# Patient Record
Sex: Female | Born: 1940 | Race: White | Hispanic: No | Smoking: Former smoker
Health system: Southern US, Community
[De-identification: ages and names within clinical notes are randomized; demographics above are authoritative.]

## PROBLEM LIST (undated history)

## (undated) DIAGNOSIS — R569 Unspecified convulsions: Secondary | ICD-10-CM

## (undated) DIAGNOSIS — N189 Chronic kidney disease, unspecified: Secondary | ICD-10-CM

## (undated) DIAGNOSIS — I10 Essential (primary) hypertension: Secondary | ICD-10-CM

## (undated) DIAGNOSIS — E119 Type 2 diabetes mellitus without complications: Secondary | ICD-10-CM

## (undated) DIAGNOSIS — E785 Hyperlipidemia, unspecified: Secondary | ICD-10-CM

## (undated) DIAGNOSIS — J189 Pneumonia, unspecified organism: Secondary | ICD-10-CM

## (undated) DIAGNOSIS — S42309A Unspecified fracture of shaft of humerus, unspecified arm, initial encounter for closed fracture: Secondary | ICD-10-CM

## (undated) DIAGNOSIS — R0600 Dyspnea, unspecified: Secondary | ICD-10-CM

## (undated) DIAGNOSIS — M75100 Unspecified rotator cuff tear or rupture of unspecified shoulder, not specified as traumatic: Secondary | ICD-10-CM

## (undated) DIAGNOSIS — J449 Chronic obstructive pulmonary disease, unspecified: Secondary | ICD-10-CM

## (undated) DIAGNOSIS — Z87442 Personal history of urinary calculi: Secondary | ICD-10-CM

## (undated) DIAGNOSIS — C801 Malignant (primary) neoplasm, unspecified: Secondary | ICD-10-CM

## (undated) DIAGNOSIS — T7840XA Allergy, unspecified, initial encounter: Secondary | ICD-10-CM

## (undated) DIAGNOSIS — Z923 Personal history of irradiation: Secondary | ICD-10-CM

## (undated) DIAGNOSIS — K635 Polyp of colon: Secondary | ICD-10-CM

## (undated) DIAGNOSIS — M7541 Impingement syndrome of right shoulder: Secondary | ICD-10-CM

## (undated) DIAGNOSIS — M1990 Unspecified osteoarthritis, unspecified site: Secondary | ICD-10-CM

## (undated) DIAGNOSIS — G4730 Sleep apnea, unspecified: Secondary | ICD-10-CM

## (undated) HISTORY — PX: EXPLORATORY LAPAROTOMY: SUR591

## (undated) HISTORY — PX: NASAL SEPTUM SURGERY: SHX37

## (undated) HISTORY — PX: OOPHORECTOMY: SHX86

## (undated) HISTORY — PX: ABDOMINAL HYSTERECTOMY: SHX81

## (undated) HISTORY — PX: EYE SURGERY: SHX253

## (undated) HISTORY — DX: Allergy, unspecified, initial encounter: T78.40XA

## (undated) HISTORY — PX: JOINT REPLACEMENT: SHX530

---

## 2002-11-14 HISTORY — PX: REPLACEMENT TOTAL KNEE: SUR1224

## 2008-04-24 ENCOUNTER — Ambulatory Visit: Payer: Self-pay | Admitting: Gastroenterology

## 2008-08-28 ENCOUNTER — Ambulatory Visit: Payer: Self-pay | Admitting: Sports Medicine

## 2010-07-27 ENCOUNTER — Ambulatory Visit: Payer: Self-pay | Admitting: Family Medicine

## 2011-03-02 ENCOUNTER — Ambulatory Visit: Payer: Self-pay | Admitting: Specialist

## 2011-06-22 ENCOUNTER — Ambulatory Visit: Payer: Self-pay | Admitting: Emergency Medicine

## 2011-06-27 LAB — PATHOLOGY REPORT

## 2011-08-08 ENCOUNTER — Ambulatory Visit: Payer: Self-pay | Admitting: Nephrology

## 2011-10-04 ENCOUNTER — Ambulatory Visit: Payer: Self-pay | Admitting: Emergency Medicine

## 2011-11-18 DIAGNOSIS — J342 Deviated nasal septum: Secondary | ICD-10-CM | POA: Diagnosis not present

## 2011-11-18 DIAGNOSIS — J328 Other chronic sinusitis: Secondary | ICD-10-CM | POA: Diagnosis not present

## 2011-11-18 DIAGNOSIS — J343 Hypertrophy of nasal turbinates: Secondary | ICD-10-CM | POA: Diagnosis not present

## 2011-11-18 DIAGNOSIS — J301 Allergic rhinitis due to pollen: Secondary | ICD-10-CM | POA: Diagnosis not present

## 2011-11-22 ENCOUNTER — Ambulatory Visit: Payer: Self-pay | Admitting: Otolaryngology

## 2011-11-22 DIAGNOSIS — J342 Deviated nasal septum: Secondary | ICD-10-CM | POA: Diagnosis not present

## 2011-11-22 DIAGNOSIS — Z79899 Other long term (current) drug therapy: Secondary | ICD-10-CM | POA: Diagnosis not present

## 2011-11-22 DIAGNOSIS — Z01812 Encounter for preprocedural laboratory examination: Secondary | ICD-10-CM | POA: Diagnosis not present

## 2011-11-22 DIAGNOSIS — Z01810 Encounter for preprocedural cardiovascular examination: Secondary | ICD-10-CM | POA: Diagnosis not present

## 2011-11-22 LAB — BASIC METABOLIC PANEL
Anion Gap: 8 (ref 7–16)
BUN: 28 mg/dL — ABNORMAL HIGH (ref 7–18)
Calcium, Total: 9.8 mg/dL (ref 8.5–10.1)
Creatinine: 0.85 mg/dL (ref 0.60–1.30)
EGFR (African American): >60
Glucose: 115 mg/dL — ABNORMAL HIGH (ref 65–99)
Sodium: 142 mmol/L (ref 136–145)

## 2011-11-22 LAB — HEMOGLOBIN: HGB: 13.7 g/dL (ref 12.0–16.0)

## 2011-11-24 ENCOUNTER — Ambulatory Visit: Payer: Self-pay | Admitting: Otolaryngology

## 2011-11-24 DIAGNOSIS — J343 Hypertrophy of nasal turbinates: Secondary | ICD-10-CM | POA: Diagnosis not present

## 2011-11-24 DIAGNOSIS — J342 Deviated nasal septum: Secondary | ICD-10-CM | POA: Diagnosis not present

## 2011-11-24 DIAGNOSIS — J449 Chronic obstructive pulmonary disease, unspecified: Secondary | ICD-10-CM | POA: Diagnosis not present

## 2011-11-24 DIAGNOSIS — J3489 Other specified disorders of nose and nasal sinuses: Secondary | ICD-10-CM | POA: Diagnosis not present

## 2011-11-24 DIAGNOSIS — Z8544 Personal history of malignant neoplasm of other female genital organs: Secondary | ICD-10-CM | POA: Diagnosis not present

## 2011-11-24 DIAGNOSIS — Z79899 Other long term (current) drug therapy: Secondary | ICD-10-CM | POA: Diagnosis not present

## 2011-11-24 DIAGNOSIS — E119 Type 2 diabetes mellitus without complications: Secondary | ICD-10-CM | POA: Diagnosis not present

## 2011-11-24 DIAGNOSIS — D649 Anemia, unspecified: Secondary | ICD-10-CM | POA: Diagnosis not present

## 2011-11-24 DIAGNOSIS — I10 Essential (primary) hypertension: Secondary | ICD-10-CM | POA: Diagnosis not present

## 2011-11-24 DIAGNOSIS — G4730 Sleep apnea, unspecified: Secondary | ICD-10-CM | POA: Diagnosis not present

## 2011-11-30 DIAGNOSIS — J3489 Other specified disorders of nose and nasal sinuses: Secondary | ICD-10-CM | POA: Diagnosis not present

## 2011-11-30 DIAGNOSIS — J342 Deviated nasal septum: Secondary | ICD-10-CM | POA: Diagnosis not present

## 2011-11-30 DIAGNOSIS — J343 Hypertrophy of nasal turbinates: Secondary | ICD-10-CM | POA: Diagnosis not present

## 2011-12-19 DIAGNOSIS — R040 Epistaxis: Secondary | ICD-10-CM | POA: Diagnosis not present

## 2011-12-19 DIAGNOSIS — J328 Other chronic sinusitis: Secondary | ICD-10-CM | POA: Diagnosis not present

## 2011-12-29 DIAGNOSIS — I10 Essential (primary) hypertension: Secondary | ICD-10-CM | POA: Diagnosis not present

## 2012-01-16 DIAGNOSIS — J328 Other chronic sinusitis: Secondary | ICD-10-CM | POA: Diagnosis not present

## 2012-01-27 DIAGNOSIS — R809 Proteinuria, unspecified: Secondary | ICD-10-CM | POA: Diagnosis not present

## 2012-01-27 DIAGNOSIS — I10 Essential (primary) hypertension: Secondary | ICD-10-CM | POA: Diagnosis not present

## 2012-03-13 DIAGNOSIS — I10 Essential (primary) hypertension: Secondary | ICD-10-CM | POA: Diagnosis not present

## 2012-03-13 DIAGNOSIS — E7800 Pure hypercholesterolemia, unspecified: Secondary | ICD-10-CM | POA: Diagnosis not present

## 2012-03-13 DIAGNOSIS — E119 Type 2 diabetes mellitus without complications: Secondary | ICD-10-CM | POA: Diagnosis not present

## 2012-03-13 DIAGNOSIS — E785 Hyperlipidemia, unspecified: Secondary | ICD-10-CM | POA: Diagnosis not present

## 2012-04-27 DIAGNOSIS — J328 Other chronic sinusitis: Secondary | ICD-10-CM | POA: Diagnosis not present

## 2012-04-27 DIAGNOSIS — H9319 Tinnitus, unspecified ear: Secondary | ICD-10-CM | POA: Diagnosis not present

## 2012-05-15 DIAGNOSIS — I10 Essential (primary) hypertension: Secondary | ICD-10-CM | POA: Diagnosis not present

## 2012-05-15 DIAGNOSIS — R809 Proteinuria, unspecified: Secondary | ICD-10-CM | POA: Diagnosis not present

## 2012-06-12 DIAGNOSIS — I10 Essential (primary) hypertension: Secondary | ICD-10-CM | POA: Diagnosis not present

## 2012-06-12 DIAGNOSIS — E119 Type 2 diabetes mellitus without complications: Secondary | ICD-10-CM | POA: Diagnosis not present

## 2012-06-12 DIAGNOSIS — E785 Hyperlipidemia, unspecified: Secondary | ICD-10-CM | POA: Diagnosis not present

## 2012-07-17 DIAGNOSIS — J019 Acute sinusitis, unspecified: Secondary | ICD-10-CM | POA: Diagnosis not present

## 2012-08-01 DIAGNOSIS — M159 Polyosteoarthritis, unspecified: Secondary | ICD-10-CM | POA: Diagnosis not present

## 2012-08-01 DIAGNOSIS — Z23 Encounter for immunization: Secondary | ICD-10-CM | POA: Diagnosis not present

## 2012-08-01 DIAGNOSIS — I10 Essential (primary) hypertension: Secondary | ICD-10-CM | POA: Diagnosis not present

## 2012-08-08 ENCOUNTER — Ambulatory Visit: Payer: Self-pay

## 2012-08-08 DIAGNOSIS — Z1231 Encounter for screening mammogram for malignant neoplasm of breast: Secondary | ICD-10-CM | POA: Diagnosis not present

## 2012-08-08 DIAGNOSIS — N63 Unspecified lump in unspecified breast: Secondary | ICD-10-CM | POA: Diagnosis not present

## 2012-08-09 ENCOUNTER — Ambulatory Visit: Payer: Self-pay

## 2012-08-09 DIAGNOSIS — N63 Unspecified lump in unspecified breast: Secondary | ICD-10-CM | POA: Diagnosis not present

## 2012-08-09 DIAGNOSIS — R928 Other abnormal and inconclusive findings on diagnostic imaging of breast: Secondary | ICD-10-CM | POA: Diagnosis not present

## 2012-09-05 DIAGNOSIS — H3530 Unspecified macular degeneration: Secondary | ICD-10-CM | POA: Diagnosis not present

## 2012-09-16 ENCOUNTER — Ambulatory Visit: Payer: Self-pay | Admitting: Internal Medicine

## 2012-09-16 ENCOUNTER — Inpatient Hospital Stay: Payer: Self-pay | Admitting: Internal Medicine

## 2012-09-16 DIAGNOSIS — Z79899 Other long term (current) drug therapy: Secondary | ICD-10-CM | POA: Diagnosis not present

## 2012-09-16 DIAGNOSIS — Z418 Encounter for other procedures for purposes other than remedying health state: Secondary | ICD-10-CM | POA: Diagnosis not present

## 2012-09-16 DIAGNOSIS — A419 Sepsis, unspecified organism: Secondary | ICD-10-CM | POA: Diagnosis present

## 2012-09-16 DIAGNOSIS — R509 Fever, unspecified: Secondary | ICD-10-CM | POA: Diagnosis not present

## 2012-09-16 DIAGNOSIS — J15211 Pneumonia due to Methicillin susceptible Staphylococcus aureus: Secondary | ICD-10-CM | POA: Diagnosis present

## 2012-09-16 DIAGNOSIS — J449 Chronic obstructive pulmonary disease, unspecified: Secondary | ICD-10-CM | POA: Diagnosis not present

## 2012-09-16 DIAGNOSIS — R0902 Hypoxemia: Secondary | ICD-10-CM | POA: Diagnosis not present

## 2012-09-16 DIAGNOSIS — M5030 Other cervical disc degeneration, unspecified cervical region: Secondary | ICD-10-CM | POA: Diagnosis present

## 2012-09-16 DIAGNOSIS — G40909 Epilepsy, unspecified, not intractable, without status epilepticus: Secondary | ICD-10-CM | POA: Diagnosis present

## 2012-09-16 DIAGNOSIS — Z9889 Other specified postprocedural states: Secondary | ICD-10-CM | POA: Diagnosis not present

## 2012-09-16 DIAGNOSIS — I10 Essential (primary) hypertension: Secondary | ICD-10-CM | POA: Diagnosis not present

## 2012-09-16 DIAGNOSIS — J441 Chronic obstructive pulmonary disease with (acute) exacerbation: Secondary | ICD-10-CM | POA: Diagnosis not present

## 2012-09-16 DIAGNOSIS — R042 Hemoptysis: Secondary | ICD-10-CM | POA: Diagnosis not present

## 2012-09-16 DIAGNOSIS — J159 Unspecified bacterial pneumonia: Secondary | ICD-10-CM | POA: Diagnosis not present

## 2012-09-16 DIAGNOSIS — R079 Chest pain, unspecified: Secondary | ICD-10-CM | POA: Diagnosis not present

## 2012-09-16 DIAGNOSIS — R6889 Other general symptoms and signs: Secondary | ICD-10-CM | POA: Diagnosis not present

## 2012-09-16 DIAGNOSIS — R7989 Other specified abnormal findings of blood chemistry: Secondary | ICD-10-CM | POA: Diagnosis not present

## 2012-09-16 DIAGNOSIS — Z8544 Personal history of malignant neoplasm of other female genital organs: Secondary | ICD-10-CM | POA: Diagnosis not present

## 2012-09-16 DIAGNOSIS — Z6836 Body mass index (BMI) 36.0-36.9, adult: Secondary | ICD-10-CM | POA: Diagnosis not present

## 2012-09-16 DIAGNOSIS — G8929 Other chronic pain: Secondary | ICD-10-CM | POA: Diagnosis present

## 2012-09-16 DIAGNOSIS — I509 Heart failure, unspecified: Secondary | ICD-10-CM | POA: Diagnosis not present

## 2012-09-16 DIAGNOSIS — N179 Acute kidney failure, unspecified: Secondary | ICD-10-CM | POA: Diagnosis not present

## 2012-09-16 DIAGNOSIS — J189 Pneumonia, unspecified organism: Secondary | ICD-10-CM | POA: Diagnosis not present

## 2012-09-16 DIAGNOSIS — E785 Hyperlipidemia, unspecified: Secondary | ICD-10-CM | POA: Diagnosis not present

## 2012-09-16 DIAGNOSIS — Z452 Encounter for adjustment and management of vascular access device: Secondary | ICD-10-CM | POA: Diagnosis not present

## 2012-09-16 DIAGNOSIS — R0602 Shortness of breath: Secondary | ICD-10-CM | POA: Diagnosis not present

## 2012-09-16 DIAGNOSIS — E119 Type 2 diabetes mellitus without complications: Secondary | ICD-10-CM | POA: Diagnosis present

## 2012-09-16 DIAGNOSIS — A4901 Methicillin susceptible Staphylococcus aureus infection, unspecified site: Secondary | ICD-10-CM | POA: Diagnosis not present

## 2012-09-16 DIAGNOSIS — Z87891 Personal history of nicotine dependence: Secondary | ICD-10-CM | POA: Diagnosis not present

## 2012-09-16 DIAGNOSIS — R578 Other shock: Secondary | ICD-10-CM | POA: Diagnosis not present

## 2012-09-16 DIAGNOSIS — J9600 Acute respiratory failure, unspecified whether with hypoxia or hypercapnia: Secondary | ICD-10-CM | POA: Diagnosis present

## 2012-09-16 DIAGNOSIS — E669 Obesity, unspecified: Secondary | ICD-10-CM | POA: Diagnosis present

## 2012-09-16 DIAGNOSIS — R05 Cough: Secondary | ICD-10-CM | POA: Diagnosis not present

## 2012-09-16 DIAGNOSIS — G4730 Sleep apnea, unspecified: Secondary | ICD-10-CM | POA: Diagnosis present

## 2012-09-16 DIAGNOSIS — R918 Other nonspecific abnormal finding of lung field: Secondary | ICD-10-CM | POA: Diagnosis not present

## 2012-09-16 DIAGNOSIS — A4101 Sepsis due to Methicillin susceptible Staphylococcus aureus: Secondary | ICD-10-CM | POA: Diagnosis present

## 2012-09-16 DIAGNOSIS — Z96659 Presence of unspecified artificial knee joint: Secondary | ICD-10-CM | POA: Diagnosis not present

## 2012-09-16 LAB — CBC WITH DIFFERENTIAL/PLATELET
Basophil %: 0.1 %
Eosinophil #: 0.1 10*3/uL (ref 0.0–0.7)
Eosinophil %: 0.3 %
HGB: 13 g/dL (ref 12.0–16.0)
Lymphocyte #: 0.6 10*3/uL — ABNORMAL LOW (ref 1.0–3.6)
MCH: 33.5 pg (ref 26.0–34.0)
MCHC: 34.9 g/dL (ref 32.0–36.0)
MCV: 96 fL (ref 80–100)
Monocyte #: 2 x10 3/mm — ABNORMAL HIGH (ref 0.2–0.9)
Neutrophil %: 89.3 %
Platelet: 357 10*3/uL (ref 150–440)
RBC: 3.88 10*6/uL (ref 3.80–5.20)
WBC: 25.6 10*3/uL — ABNORMAL HIGH (ref 3.6–11.0)

## 2012-09-16 LAB — COMPREHENSIVE METABOLIC PANEL
Albumin: 3.1 g/dL — ABNORMAL LOW (ref 3.4–5.0)
Anion Gap: 14 (ref 7–16)
BUN: 33 mg/dL — ABNORMAL HIGH (ref 7–18)
Chloride: 95 mmol/L — ABNORMAL LOW (ref 98–107)
EGFR (African American): 39 — ABNORMAL LOW
EGFR (Non-African Amer.): 34 — ABNORMAL LOW
Glucose: 134 mg/dL — ABNORMAL HIGH (ref 65–99)
Osmolality: 272 (ref 275–301)
Potassium: 3.3 mmol/L — ABNORMAL LOW (ref 3.5–5.1)
SGOT(AST): 52 U/L — ABNORMAL HIGH (ref 15–37)
Sodium: 131 mmol/L — ABNORMAL LOW (ref 136–145)
Total Protein: 6.8 g/dL (ref 6.4–8.2)

## 2012-09-16 LAB — TROPONIN I: Troponin-I: <0.02 ng/mL

## 2012-09-17 LAB — HEPATIC FUNCTION PANEL A (ARMC)
Alkaline Phosphatase: 115 U/L (ref 50–136)
Bilirubin, Direct: 1.2 mg/dL — ABNORMAL HIGH (ref 0.00–0.20)
SGOT(AST): 22 U/L (ref 15–37)
SGPT (ALT): 97 U/L — ABNORMAL HIGH (ref 12–78)

## 2012-09-17 LAB — BASIC METABOLIC PANEL
Anion Gap: 12 (ref 7–16)
BUN: 45 mg/dL — ABNORMAL HIGH (ref 7–18)
Co2: 23 mmol/L (ref 21–32)
Creatinine: 1.46 mg/dL — ABNORMAL HIGH (ref 0.60–1.30)
EGFR (African American): 42 — ABNORMAL LOW
EGFR (Non-African Amer.): 36 — ABNORMAL LOW
Glucose: 169 mg/dL — ABNORMAL HIGH (ref 65–99)
Potassium: 3.5 mmol/L (ref 3.5–5.1)

## 2012-09-17 LAB — CK TOTAL AND CKMB (NOT AT ARMC): CK, Total: 22 U/L (ref 21–215)

## 2012-09-17 LAB — CBC WITH DIFFERENTIAL/PLATELET
Basophil %: 0.2 %
Eosinophil #: 0.1 10*3/uL (ref 0.0–0.7)
HCT: 33.8 % — ABNORMAL LOW (ref 35.0–47.0)
Lymphocyte #: 0.4 10*3/uL — ABNORMAL LOW (ref 1.0–3.6)
Lymphocyte %: 1.7 %
MCH: 32.6 pg (ref 26.0–34.0)
MCHC: 34.5 g/dL (ref 32.0–36.0)
MCV: 95 fL (ref 80–100)
Monocyte #: 1 x10 3/mm — ABNORMAL HIGH (ref 0.2–0.9)
Neutrophil #: 22.2 10*3/uL — ABNORMAL HIGH (ref 1.4–6.5)
RBC: 3.58 10*6/uL — ABNORMAL LOW (ref 3.80–5.20)
RDW: 12.8 % (ref 11.5–14.5)
WBC: 23.7 10*3/uL — ABNORMAL HIGH (ref 3.6–11.0)

## 2012-09-17 LAB — PHOSPHORUS: Phosphorus: 2.2 mg/dL — ABNORMAL LOW (ref 2.5–4.9)

## 2012-09-18 LAB — CBC WITH DIFFERENTIAL/PLATELET
Basophil %: 0.2 %
Eosinophil %: 0 %
HGB: 11.4 g/dL — ABNORMAL LOW (ref 12.0–16.0)
Lymphocyte #: 0.4 10*3/uL — ABNORMAL LOW (ref 1.0–3.6)
Lymphocyte %: 2 %
MCHC: 33.8 g/dL (ref 32.0–36.0)
MCV: 94 fL (ref 80–100)
Monocyte %: 5.4 %
Neutrophil #: 19.9 10*3/uL — ABNORMAL HIGH (ref 1.4–6.5)
Platelet: 417 10*3/uL (ref 150–440)
RBC: 3.58 10*6/uL — ABNORMAL LOW (ref 3.80–5.20)
WBC: 21.5 10*3/uL — ABNORMAL HIGH (ref 3.6–11.0)

## 2012-09-18 LAB — BASIC METABOLIC PANEL
BUN: 32 mg/dL — ABNORMAL HIGH (ref 7–18)
Chloride: 106 mmol/L (ref 98–107)
Co2: 26 mmol/L (ref 21–32)
EGFR (Non-African Amer.): >60
Glucose: 179 mg/dL — ABNORMAL HIGH (ref 65–99)
Osmolality: 289 (ref 275–301)
Potassium: 3.8 mmol/L (ref 3.5–5.1)
Sodium: 139 mmol/L (ref 136–145)

## 2012-09-18 LAB — MAGNESIUM: Magnesium: 2 mg/dL

## 2012-09-18 LAB — TSH: Thyroid Stimulating Horm: 0.22 u[IU]/mL — ABNORMAL LOW

## 2012-09-18 LAB — PHENYTOIN LEVEL, TOTAL: Dilantin: 7.9 ug/mL — ABNORMAL LOW (ref 10.0–20.0)

## 2012-09-19 LAB — CBC WITH DIFFERENTIAL/PLATELET
HGB: 12.5 g/dL (ref 12.0–16.0)
MCH: 31.9 pg (ref 26.0–34.0)
MCV: 95 fL (ref 80–100)
Myelocyte: 3 %
Platelet: 446 10*3/uL — ABNORMAL HIGH (ref 150–440)
RBC: 3.91 10*6/uL (ref 3.80–5.20)
RDW: 13.1 % (ref 11.5–14.5)
WBC: 23.5 10*3/uL — ABNORMAL HIGH (ref 3.6–11.0)

## 2012-09-19 LAB — CREATININE, SERUM: EGFR (African American): >60

## 2012-09-19 LAB — EXPECTORATED SPUTUM ASSESSMENT W REFEX TO RESP CULTURE

## 2012-09-21 LAB — CREATININE, SERUM: EGFR (African American): >60

## 2012-09-21 LAB — CBC WITH DIFFERENTIAL/PLATELET
Basophil #: 0 10*3/uL (ref 0.0–0.1)
Basophil %: 0.2 %
Eosinophil #: 0.1 10*3/uL (ref 0.0–0.7)
HGB: 12.9 g/dL (ref 12.0–16.0)
Lymphocyte #: 1.2 10*3/uL (ref 1.0–3.6)
Lymphocyte %: 7.9 %
MCHC: 34.4 g/dL (ref 32.0–36.0)
MCV: 94 fL (ref 80–100)
Monocyte #: 1 x10 3/mm — ABNORMAL HIGH (ref 0.2–0.9)
Neutrophil %: 84.4 %
Platelet: 403 10*3/uL (ref 150–440)
RBC: 4.01 10*6/uL (ref 3.80–5.20)
RDW: 13 % (ref 11.5–14.5)

## 2012-09-22 LAB — CULTURE, BLOOD (SINGLE)

## 2012-09-23 LAB — BASIC METABOLIC PANEL
Anion Gap: 4 — ABNORMAL LOW (ref 7–16)
BUN: 14 mg/dL (ref 7–18)
Calcium, Total: 9.3 mg/dL (ref 8.5–10.1)
Chloride: 100 mmol/L (ref 98–107)
Co2: 35 mmol/L — ABNORMAL HIGH (ref 21–32)
Creatinine: 0.62 mg/dL (ref 0.60–1.30)
EGFR (African American): >60
Osmolality: 281 (ref 275–301)
Sodium: 139 mmol/L (ref 136–145)

## 2012-09-23 LAB — CBC WITH DIFFERENTIAL/PLATELET
Comment - H1-Com1: NORMAL
Lymphocytes: 17 %
MCH: 32.8 pg (ref 26.0–34.0)
MCHC: 34.5 g/dL (ref 32.0–36.0)
MCV: 95 fL (ref 80–100)
Monocytes: 2 %
Platelet: 360 10*3/uL (ref 150–440)
RBC: 4.15 10*6/uL (ref 3.80–5.20)
RDW: 13.1 % (ref 11.5–14.5)

## 2012-09-24 LAB — CBC WITH DIFFERENTIAL/PLATELET
Basophil #: 0.1 10*3/uL (ref 0.0–0.1)
Basophil %: 0.3 %
Eosinophil #: 0.5 10*3/uL (ref 0.0–0.7)
HGB: 13.5 g/dL (ref 12.0–16.0)
Lymphocyte %: 5.7 %
MCHC: 34.3 g/dL (ref 32.0–36.0)
Monocyte %: 3.3 %
Neutrophil #: 14.4 10*3/uL — ABNORMAL HIGH (ref 1.4–6.5)
Neutrophil %: 87.4 %
RBC: 4.16 10*6/uL (ref 3.80–5.20)

## 2012-09-24 LAB — VANCOMYCIN, TROUGH: Vancomycin, Trough: 13 ug/mL (ref 10–20)

## 2012-09-25 LAB — CBC WITH DIFFERENTIAL/PLATELET
Bands: 3 %
Basophil: 1 %
Comment - H1-Com2: NORMAL
Eosinophil: 2 %
HGB: 12.2 g/dL (ref 12.0–16.0)
Lymphocytes: 13 %
MCV: 95 fL (ref 80–100)
Monocytes: 4 %
Platelet: 315 10*3/uL (ref 150–440)
Segmented Neutrophils: 73 %
WBC: 13.2 10*3/uL — ABNORMAL HIGH (ref 3.6–11.0)

## 2012-09-26 DIAGNOSIS — J189 Pneumonia, unspecified organism: Secondary | ICD-10-CM | POA: Diagnosis not present

## 2012-09-26 DIAGNOSIS — A4901 Methicillin susceptible Staphylococcus aureus infection, unspecified site: Secondary | ICD-10-CM | POA: Diagnosis not present

## 2012-09-27 LAB — EXPECTORATED SPUTUM ASSESSMENT W GRAM STAIN, RFLX TO RESP C

## 2012-10-02 DIAGNOSIS — J189 Pneumonia, unspecified organism: Secondary | ICD-10-CM | POA: Diagnosis not present

## 2012-10-02 DIAGNOSIS — J449 Chronic obstructive pulmonary disease, unspecified: Secondary | ICD-10-CM | POA: Diagnosis not present

## 2012-10-02 DIAGNOSIS — R042 Hemoptysis: Secondary | ICD-10-CM | POA: Diagnosis not present

## 2012-10-02 DIAGNOSIS — J1520 Pneumonia due to staphylococcus, unspecified: Secondary | ICD-10-CM | POA: Diagnosis not present

## 2012-10-02 DIAGNOSIS — R0602 Shortness of breath: Secondary | ICD-10-CM | POA: Diagnosis not present

## 2012-10-09 ENCOUNTER — Ambulatory Visit: Payer: Self-pay | Admitting: Internal Medicine

## 2012-10-09 DIAGNOSIS — J1281 Pneumonia due to SARS-associated coronavirus: Secondary | ICD-10-CM | POA: Diagnosis not present

## 2012-10-09 DIAGNOSIS — J90 Pleural effusion, not elsewhere classified: Secondary | ICD-10-CM | POA: Diagnosis not present

## 2012-10-09 DIAGNOSIS — R918 Other nonspecific abnormal finding of lung field: Secondary | ICD-10-CM | POA: Diagnosis not present

## 2012-10-16 DIAGNOSIS — H25049 Posterior subcapsular polar age-related cataract, unspecified eye: Secondary | ICD-10-CM | POA: Diagnosis not present

## 2012-10-18 DIAGNOSIS — J449 Chronic obstructive pulmonary disease, unspecified: Secondary | ICD-10-CM | POA: Diagnosis not present

## 2012-10-18 DIAGNOSIS — J309 Allergic rhinitis, unspecified: Secondary | ICD-10-CM | POA: Diagnosis not present

## 2012-10-18 DIAGNOSIS — J1520 Pneumonia due to staphylococcus, unspecified: Secondary | ICD-10-CM | POA: Diagnosis not present

## 2012-10-23 DIAGNOSIS — E119 Type 2 diabetes mellitus without complications: Secondary | ICD-10-CM | POA: Diagnosis not present

## 2012-10-23 DIAGNOSIS — Z0000 Encounter for general adult medical examination without abnormal findings: Secondary | ICD-10-CM | POA: Diagnosis not present

## 2012-10-23 DIAGNOSIS — R569 Unspecified convulsions: Secondary | ICD-10-CM | POA: Diagnosis not present

## 2012-10-23 DIAGNOSIS — I10 Essential (primary) hypertension: Secondary | ICD-10-CM | POA: Diagnosis not present

## 2012-10-23 DIAGNOSIS — R5381 Other malaise: Secondary | ICD-10-CM | POA: Diagnosis not present

## 2012-10-23 DIAGNOSIS — E785 Hyperlipidemia, unspecified: Secondary | ICD-10-CM | POA: Diagnosis not present

## 2012-10-29 ENCOUNTER — Ambulatory Visit: Payer: Self-pay

## 2012-10-29 DIAGNOSIS — Q619 Cystic kidney disease, unspecified: Secondary | ICD-10-CM | POA: Diagnosis not present

## 2012-10-29 DIAGNOSIS — R160 Hepatomegaly, not elsewhere classified: Secondary | ICD-10-CM | POA: Diagnosis not present

## 2012-10-30 DIAGNOSIS — I10 Essential (primary) hypertension: Secondary | ICD-10-CM | POA: Diagnosis not present

## 2012-10-30 DIAGNOSIS — R809 Proteinuria, unspecified: Secondary | ICD-10-CM | POA: Diagnosis not present

## 2012-10-31 DIAGNOSIS — R0602 Shortness of breath: Secondary | ICD-10-CM | POA: Diagnosis not present

## 2012-11-16 ENCOUNTER — Ambulatory Visit: Payer: Self-pay | Admitting: Internal Medicine

## 2012-11-16 DIAGNOSIS — J189 Pneumonia, unspecified organism: Secondary | ICD-10-CM | POA: Diagnosis not present

## 2012-11-16 DIAGNOSIS — J438 Other emphysema: Secondary | ICD-10-CM | POA: Diagnosis not present

## 2012-11-22 DIAGNOSIS — J449 Chronic obstructive pulmonary disease, unspecified: Secondary | ICD-10-CM | POA: Diagnosis not present

## 2012-11-22 DIAGNOSIS — J309 Allergic rhinitis, unspecified: Secondary | ICD-10-CM | POA: Diagnosis not present

## 2012-11-22 DIAGNOSIS — R042 Hemoptysis: Secondary | ICD-10-CM | POA: Diagnosis not present

## 2012-11-26 DIAGNOSIS — H25049 Posterior subcapsular polar age-related cataract, unspecified eye: Secondary | ICD-10-CM | POA: Diagnosis not present

## 2012-12-12 ENCOUNTER — Ambulatory Visit: Payer: Self-pay | Admitting: Ophthalmology

## 2012-12-12 DIAGNOSIS — Z79899 Other long term (current) drug therapy: Secondary | ICD-10-CM | POA: Diagnosis not present

## 2012-12-12 DIAGNOSIS — G4730 Sleep apnea, unspecified: Secondary | ICD-10-CM | POA: Diagnosis not present

## 2012-12-12 DIAGNOSIS — Z91041 Radiographic dye allergy status: Secondary | ICD-10-CM | POA: Diagnosis not present

## 2012-12-12 DIAGNOSIS — E119 Type 2 diabetes mellitus without complications: Secondary | ICD-10-CM | POA: Diagnosis not present

## 2012-12-12 DIAGNOSIS — Z888 Allergy status to other drugs, medicaments and biological substances status: Secondary | ICD-10-CM | POA: Diagnosis not present

## 2012-12-12 DIAGNOSIS — Z87891 Personal history of nicotine dependence: Secondary | ICD-10-CM | POA: Diagnosis not present

## 2012-12-12 DIAGNOSIS — I10 Essential (primary) hypertension: Secondary | ICD-10-CM | POA: Diagnosis not present

## 2012-12-12 DIAGNOSIS — K5730 Diverticulosis of large intestine without perforation or abscess without bleeding: Secondary | ICD-10-CM | POA: Diagnosis not present

## 2012-12-12 DIAGNOSIS — H25049 Posterior subcapsular polar age-related cataract, unspecified eye: Secondary | ICD-10-CM | POA: Diagnosis not present

## 2012-12-12 DIAGNOSIS — M1990 Unspecified osteoarthritis, unspecified site: Secondary | ICD-10-CM | POA: Diagnosis not present

## 2012-12-12 DIAGNOSIS — Z7982 Long term (current) use of aspirin: Secondary | ICD-10-CM | POA: Diagnosis not present

## 2012-12-12 DIAGNOSIS — E7800 Pure hypercholesterolemia, unspecified: Secondary | ICD-10-CM | POA: Diagnosis not present

## 2012-12-12 DIAGNOSIS — J449 Chronic obstructive pulmonary disease, unspecified: Secondary | ICD-10-CM | POA: Diagnosis not present

## 2013-03-08 DIAGNOSIS — J209 Acute bronchitis, unspecified: Secondary | ICD-10-CM | POA: Diagnosis not present

## 2013-03-08 DIAGNOSIS — J019 Acute sinusitis, unspecified: Secondary | ICD-10-CM | POA: Diagnosis not present

## 2013-03-08 DIAGNOSIS — J45901 Unspecified asthma with (acute) exacerbation: Secondary | ICD-10-CM | POA: Diagnosis not present

## 2013-04-29 DIAGNOSIS — I10 Essential (primary) hypertension: Secondary | ICD-10-CM | POA: Diagnosis not present

## 2013-04-29 DIAGNOSIS — J449 Chronic obstructive pulmonary disease, unspecified: Secondary | ICD-10-CM | POA: Diagnosis not present

## 2013-04-29 DIAGNOSIS — Z23 Encounter for immunization: Secondary | ICD-10-CM | POA: Diagnosis not present

## 2013-04-29 DIAGNOSIS — R0602 Shortness of breath: Secondary | ICD-10-CM | POA: Diagnosis not present

## 2013-04-29 DIAGNOSIS — E119 Type 2 diabetes mellitus without complications: Secondary | ICD-10-CM | POA: Diagnosis not present

## 2013-04-29 DIAGNOSIS — E785 Hyperlipidemia, unspecified: Secondary | ICD-10-CM | POA: Diagnosis not present

## 2013-04-30 DIAGNOSIS — I10 Essential (primary) hypertension: Secondary | ICD-10-CM | POA: Diagnosis not present

## 2013-04-30 DIAGNOSIS — R809 Proteinuria, unspecified: Secondary | ICD-10-CM | POA: Diagnosis not present

## 2013-05-01 DIAGNOSIS — J060 Acute laryngopharyngitis: Secondary | ICD-10-CM | POA: Diagnosis not present

## 2013-05-01 DIAGNOSIS — H6120 Impacted cerumen, unspecified ear: Secondary | ICD-10-CM | POA: Diagnosis not present

## 2013-05-01 DIAGNOSIS — J209 Acute bronchitis, unspecified: Secondary | ICD-10-CM | POA: Diagnosis not present

## 2013-05-06 DIAGNOSIS — I10 Essential (primary) hypertension: Secondary | ICD-10-CM | POA: Diagnosis not present

## 2013-05-06 DIAGNOSIS — R809 Proteinuria, unspecified: Secondary | ICD-10-CM | POA: Diagnosis not present

## 2013-05-22 DIAGNOSIS — R498 Other voice and resonance disorders: Secondary | ICD-10-CM | POA: Diagnosis not present

## 2013-05-22 DIAGNOSIS — K219 Gastro-esophageal reflux disease without esophagitis: Secondary | ICD-10-CM | POA: Diagnosis not present

## 2013-05-28 DIAGNOSIS — I10 Essential (primary) hypertension: Secondary | ICD-10-CM | POA: Diagnosis not present

## 2013-05-28 DIAGNOSIS — J45909 Unspecified asthma, uncomplicated: Secondary | ICD-10-CM | POA: Diagnosis not present

## 2013-06-18 DIAGNOSIS — H2510 Age-related nuclear cataract, unspecified eye: Secondary | ICD-10-CM | POA: Diagnosis not present

## 2013-06-28 DIAGNOSIS — H2510 Age-related nuclear cataract, unspecified eye: Secondary | ICD-10-CM | POA: Diagnosis not present

## 2013-07-10 ENCOUNTER — Ambulatory Visit: Payer: Self-pay | Admitting: Ophthalmology

## 2013-07-10 DIAGNOSIS — E119 Type 2 diabetes mellitus without complications: Secondary | ICD-10-CM | POA: Diagnosis not present

## 2013-07-10 DIAGNOSIS — M19019 Primary osteoarthritis, unspecified shoulder: Secondary | ICD-10-CM | POA: Diagnosis not present

## 2013-07-10 DIAGNOSIS — Z79899 Other long term (current) drug therapy: Secondary | ICD-10-CM | POA: Diagnosis not present

## 2013-07-10 DIAGNOSIS — H269 Unspecified cataract: Secondary | ICD-10-CM | POA: Diagnosis not present

## 2013-07-10 DIAGNOSIS — E669 Obesity, unspecified: Secondary | ICD-10-CM | POA: Diagnosis not present

## 2013-07-10 DIAGNOSIS — J449 Chronic obstructive pulmonary disease, unspecified: Secondary | ICD-10-CM | POA: Diagnosis not present

## 2013-07-10 DIAGNOSIS — G4733 Obstructive sleep apnea (adult) (pediatric): Secondary | ICD-10-CM | POA: Diagnosis not present

## 2013-07-10 DIAGNOSIS — R011 Cardiac murmur, unspecified: Secondary | ICD-10-CM | POA: Diagnosis not present

## 2013-07-10 DIAGNOSIS — Z7982 Long term (current) use of aspirin: Secondary | ICD-10-CM | POA: Diagnosis not present

## 2013-07-10 DIAGNOSIS — I10 Essential (primary) hypertension: Secondary | ICD-10-CM | POA: Diagnosis not present

## 2013-07-10 DIAGNOSIS — Z96659 Presence of unspecified artificial knee joint: Secondary | ICD-10-CM | POA: Diagnosis not present

## 2013-07-10 DIAGNOSIS — E7800 Pure hypercholesterolemia, unspecified: Secondary | ICD-10-CM | POA: Diagnosis not present

## 2013-07-10 DIAGNOSIS — H2510 Age-related nuclear cataract, unspecified eye: Secondary | ICD-10-CM | POA: Diagnosis not present

## 2013-07-10 DIAGNOSIS — IMO0002 Reserved for concepts with insufficient information to code with codable children: Secondary | ICD-10-CM | POA: Diagnosis not present

## 2013-07-10 DIAGNOSIS — Z87891 Personal history of nicotine dependence: Secondary | ICD-10-CM | POA: Diagnosis not present

## 2013-07-26 DIAGNOSIS — J45909 Unspecified asthma, uncomplicated: Secondary | ICD-10-CM | POA: Diagnosis not present

## 2013-08-27 ENCOUNTER — Ambulatory Visit: Payer: Self-pay

## 2013-08-27 DIAGNOSIS — Z1231 Encounter for screening mammogram for malignant neoplasm of breast: Secondary | ICD-10-CM | POA: Diagnosis not present

## 2013-08-28 DIAGNOSIS — E7800 Pure hypercholesterolemia, unspecified: Secondary | ICD-10-CM | POA: Diagnosis not present

## 2013-08-28 DIAGNOSIS — Z23 Encounter for immunization: Secondary | ICD-10-CM | POA: Diagnosis not present

## 2013-08-28 DIAGNOSIS — E119 Type 2 diabetes mellitus without complications: Secondary | ICD-10-CM | POA: Diagnosis not present

## 2013-08-28 DIAGNOSIS — I10 Essential (primary) hypertension: Secondary | ICD-10-CM | POA: Diagnosis not present

## 2013-10-16 DIAGNOSIS — J301 Allergic rhinitis due to pollen: Secondary | ICD-10-CM | POA: Diagnosis not present

## 2013-10-16 DIAGNOSIS — J45909 Unspecified asthma, uncomplicated: Secondary | ICD-10-CM | POA: Diagnosis not present

## 2013-10-16 DIAGNOSIS — Z0000 Encounter for general adult medical examination without abnormal findings: Secondary | ICD-10-CM | POA: Diagnosis not present

## 2013-10-16 DIAGNOSIS — R569 Unspecified convulsions: Secondary | ICD-10-CM | POA: Diagnosis not present

## 2013-10-16 DIAGNOSIS — E785 Hyperlipidemia, unspecified: Secondary | ICD-10-CM | POA: Diagnosis not present

## 2013-10-16 DIAGNOSIS — J45901 Unspecified asthma with (acute) exacerbation: Secondary | ICD-10-CM | POA: Diagnosis not present

## 2013-10-16 DIAGNOSIS — J449 Chronic obstructive pulmonary disease, unspecified: Secondary | ICD-10-CM | POA: Diagnosis not present

## 2013-10-16 DIAGNOSIS — E119 Type 2 diabetes mellitus without complications: Secondary | ICD-10-CM | POA: Diagnosis not present

## 2013-10-16 DIAGNOSIS — I10 Essential (primary) hypertension: Secondary | ICD-10-CM | POA: Diagnosis not present

## 2013-10-29 DIAGNOSIS — I10 Essential (primary) hypertension: Secondary | ICD-10-CM | POA: Diagnosis not present

## 2013-10-29 DIAGNOSIS — R809 Proteinuria, unspecified: Secondary | ICD-10-CM | POA: Diagnosis not present

## 2013-10-29 DIAGNOSIS — E669 Obesity, unspecified: Secondary | ICD-10-CM | POA: Diagnosis not present

## 2013-10-30 DIAGNOSIS — D485 Neoplasm of uncertain behavior of skin: Secondary | ICD-10-CM | POA: Diagnosis not present

## 2013-10-30 DIAGNOSIS — L940 Localized scleroderma [morphea]: Secondary | ICD-10-CM | POA: Diagnosis not present

## 2013-12-13 DIAGNOSIS — M19049 Primary osteoarthritis, unspecified hand: Secondary | ICD-10-CM | POA: Diagnosis not present

## 2013-12-13 DIAGNOSIS — M542 Cervicalgia: Secondary | ICD-10-CM | POA: Diagnosis not present

## 2014-01-03 DIAGNOSIS — M542 Cervicalgia: Secondary | ICD-10-CM | POA: Diagnosis not present

## 2014-01-03 DIAGNOSIS — M5030 Other cervical disc degeneration, unspecified cervical region: Secondary | ICD-10-CM | POA: Diagnosis not present

## 2014-02-04 DIAGNOSIS — H353190 Nonexudative age-related macular degeneration, unspecified eye, stage unspecified: Secondary | ICD-10-CM | POA: Diagnosis not present

## 2014-02-11 DIAGNOSIS — E039 Hypothyroidism, unspecified: Secondary | ICD-10-CM | POA: Diagnosis not present

## 2014-02-11 DIAGNOSIS — E041 Nontoxic single thyroid nodule: Secondary | ICD-10-CM | POA: Diagnosis not present

## 2014-02-11 DIAGNOSIS — G4733 Obstructive sleep apnea (adult) (pediatric): Secondary | ICD-10-CM | POA: Diagnosis not present

## 2014-02-20 ENCOUNTER — Emergency Department: Payer: Self-pay | Admitting: Emergency Medicine

## 2014-02-20 DIAGNOSIS — M7989 Other specified soft tissue disorders: Secondary | ICD-10-CM | POA: Diagnosis not present

## 2014-02-20 DIAGNOSIS — R0602 Shortness of breath: Secondary | ICD-10-CM | POA: Diagnosis not present

## 2014-02-20 DIAGNOSIS — E119 Type 2 diabetes mellitus without complications: Secondary | ICD-10-CM | POA: Diagnosis not present

## 2014-02-20 DIAGNOSIS — I10 Essential (primary) hypertension: Secondary | ICD-10-CM | POA: Diagnosis not present

## 2014-02-20 DIAGNOSIS — Z87891 Personal history of nicotine dependence: Secondary | ICD-10-CM | POA: Diagnosis not present

## 2014-02-20 DIAGNOSIS — J209 Acute bronchitis, unspecified: Secondary | ICD-10-CM | POA: Diagnosis not present

## 2014-02-20 DIAGNOSIS — Z9079 Acquired absence of other genital organ(s): Secondary | ICD-10-CM | POA: Diagnosis not present

## 2014-02-20 DIAGNOSIS — J40 Bronchitis, not specified as acute or chronic: Secondary | ICD-10-CM | POA: Diagnosis not present

## 2014-02-20 DIAGNOSIS — E785 Hyperlipidemia, unspecified: Secondary | ICD-10-CM | POA: Diagnosis not present

## 2014-02-21 DIAGNOSIS — J40 Bronchitis, not specified as acute or chronic: Secondary | ICD-10-CM | POA: Diagnosis not present

## 2014-02-21 DIAGNOSIS — M7989 Other specified soft tissue disorders: Secondary | ICD-10-CM | POA: Diagnosis not present

## 2014-02-21 LAB — BASIC METABOLIC PANEL
Anion Gap: 8 (ref 7–16)
BUN: 41 mg/dL — ABNORMAL HIGH (ref 7–18)
CHLORIDE: 106 mmol/L (ref 98–107)
Calcium, Total: 8.8 mg/dL (ref 8.5–10.1)
Co2: 25 mmol/L (ref 21–32)
Creatinine: 1.75 mg/dL — ABNORMAL HIGH (ref 0.60–1.30)
GFR CALC AF AMER: 34 — AB
GFR CALC NON AF AMER: 29 — AB
Glucose: 234 mg/dL — ABNORMAL HIGH (ref 65–99)
Osmolality: 295 (ref 275–301)
POTASSIUM: 4.2 mmol/L (ref 3.5–5.1)
Sodium: 139 mmol/L (ref 136–145)

## 2014-02-21 LAB — CBC
HCT: 41 % (ref 35.0–47.0)
HGB: 13.3 g/dL (ref 12.0–16.0)
MCH: 31 pg (ref 26.0–34.0)
MCHC: 32.4 g/dL (ref 32.0–36.0)
MCV: 96 fL (ref 80–100)
Platelet: 397 10*3/uL (ref 150–440)
RBC: 4.28 10*6/uL (ref 3.80–5.20)
RDW: 13.4 % (ref 11.5–14.5)
WBC: 10.6 10*3/uL (ref 3.6–11.0)

## 2014-02-21 LAB — D-DIMER(ARMC): D-Dimer: 463 ng/ml

## 2014-02-21 LAB — TROPONIN I

## 2014-03-05 DIAGNOSIS — R809 Proteinuria, unspecified: Secondary | ICD-10-CM | POA: Diagnosis not present

## 2014-03-05 DIAGNOSIS — I10 Essential (primary) hypertension: Secondary | ICD-10-CM | POA: Diagnosis not present

## 2014-04-18 ENCOUNTER — Ambulatory Visit: Payer: Self-pay

## 2014-04-18 DIAGNOSIS — R609 Edema, unspecified: Secondary | ICD-10-CM | POA: Diagnosis not present

## 2014-04-18 DIAGNOSIS — I10 Essential (primary) hypertension: Secondary | ICD-10-CM | POA: Diagnosis not present

## 2014-04-18 DIAGNOSIS — I2510 Atherosclerotic heart disease of native coronary artery without angina pectoris: Secondary | ICD-10-CM | POA: Diagnosis not present

## 2014-04-18 DIAGNOSIS — R0602 Shortness of breath: Secondary | ICD-10-CM | POA: Diagnosis not present

## 2014-04-18 DIAGNOSIS — G4700 Insomnia, unspecified: Secondary | ICD-10-CM | POA: Diagnosis not present

## 2014-04-21 DIAGNOSIS — I10 Essential (primary) hypertension: Secondary | ICD-10-CM | POA: Diagnosis not present

## 2014-04-21 DIAGNOSIS — R609 Edema, unspecified: Secondary | ICD-10-CM | POA: Diagnosis not present

## 2014-04-21 DIAGNOSIS — R7989 Other specified abnormal findings of blood chemistry: Secondary | ICD-10-CM | POA: Diagnosis not present

## 2014-04-25 ENCOUNTER — Ambulatory Visit: Payer: Self-pay

## 2014-04-25 DIAGNOSIS — M899 Disorder of bone, unspecified: Secondary | ICD-10-CM | POA: Diagnosis not present

## 2014-04-25 DIAGNOSIS — M949 Disorder of cartilage, unspecified: Secondary | ICD-10-CM | POA: Diagnosis not present

## 2014-04-25 DIAGNOSIS — M159 Polyosteoarthritis, unspecified: Secondary | ICD-10-CM | POA: Diagnosis not present

## 2014-04-30 ENCOUNTER — Ambulatory Visit: Payer: Self-pay | Admitting: Cardiology

## 2014-04-30 DIAGNOSIS — M79609 Pain in unspecified limb: Secondary | ICD-10-CM | POA: Diagnosis not present

## 2014-04-30 DIAGNOSIS — R609 Edema, unspecified: Secondary | ICD-10-CM | POA: Diagnosis not present

## 2014-04-30 DIAGNOSIS — E785 Hyperlipidemia, unspecified: Secondary | ICD-10-CM | POA: Diagnosis not present

## 2014-04-30 DIAGNOSIS — M7989 Other specified soft tissue disorders: Secondary | ICD-10-CM | POA: Diagnosis not present

## 2014-04-30 DIAGNOSIS — I10 Essential (primary) hypertension: Secondary | ICD-10-CM | POA: Diagnosis not present

## 2014-04-30 DIAGNOSIS — E119 Type 2 diabetes mellitus without complications: Secondary | ICD-10-CM | POA: Diagnosis not present

## 2014-04-30 HISTORY — DX: Hyperlipidemia, unspecified: E78.5

## 2014-05-12 DIAGNOSIS — R0602 Shortness of breath: Secondary | ICD-10-CM | POA: Diagnosis not present

## 2014-05-12 DIAGNOSIS — R609 Edema, unspecified: Secondary | ICD-10-CM | POA: Diagnosis not present

## 2014-05-21 DIAGNOSIS — Z860100 Personal history of colon polyps, unspecified: Secondary | ICD-10-CM | POA: Insufficient documentation

## 2014-05-21 DIAGNOSIS — N189 Chronic kidney disease, unspecified: Secondary | ICD-10-CM | POA: Diagnosis not present

## 2014-05-21 DIAGNOSIS — K635 Polyp of colon: Secondary | ICD-10-CM

## 2014-05-21 HISTORY — DX: Polyp of colon: K63.5

## 2014-06-16 DIAGNOSIS — I10 Essential (primary) hypertension: Secondary | ICD-10-CM | POA: Diagnosis not present

## 2014-06-16 DIAGNOSIS — E119 Type 2 diabetes mellitus without complications: Secondary | ICD-10-CM | POA: Diagnosis not present

## 2014-06-16 DIAGNOSIS — E785 Hyperlipidemia, unspecified: Secondary | ICD-10-CM | POA: Diagnosis not present

## 2014-06-16 DIAGNOSIS — N182 Chronic kidney disease, stage 2 (mild): Secondary | ICD-10-CM | POA: Diagnosis not present

## 2014-06-24 DIAGNOSIS — I10 Essential (primary) hypertension: Secondary | ICD-10-CM | POA: Diagnosis not present

## 2014-06-24 DIAGNOSIS — R809 Proteinuria, unspecified: Secondary | ICD-10-CM | POA: Diagnosis not present

## 2014-07-03 DIAGNOSIS — G4710 Hypersomnia, unspecified: Secondary | ICD-10-CM | POA: Diagnosis not present

## 2014-07-03 DIAGNOSIS — R0602 Shortness of breath: Secondary | ICD-10-CM | POA: Diagnosis not present

## 2014-07-03 DIAGNOSIS — J449 Chronic obstructive pulmonary disease, unspecified: Secondary | ICD-10-CM | POA: Diagnosis not present

## 2014-07-03 DIAGNOSIS — G4730 Sleep apnea, unspecified: Secondary | ICD-10-CM | POA: Diagnosis not present

## 2014-07-09 DIAGNOSIS — R0602 Shortness of breath: Secondary | ICD-10-CM | POA: Diagnosis not present

## 2014-07-09 DIAGNOSIS — I10 Essential (primary) hypertension: Secondary | ICD-10-CM | POA: Diagnosis not present

## 2014-07-09 DIAGNOSIS — R809 Proteinuria, unspecified: Secondary | ICD-10-CM | POA: Diagnosis not present

## 2014-07-29 DIAGNOSIS — G4720 Circadian rhythm sleep disorder, unspecified type: Secondary | ICD-10-CM | POA: Diagnosis not present

## 2014-07-29 DIAGNOSIS — G4710 Hypersomnia, unspecified: Secondary | ICD-10-CM | POA: Diagnosis not present

## 2014-07-31 DIAGNOSIS — G40909 Epilepsy, unspecified, not intractable, without status epilepticus: Secondary | ICD-10-CM | POA: Diagnosis not present

## 2014-07-31 DIAGNOSIS — E1129 Type 2 diabetes mellitus with other diabetic kidney complication: Secondary | ICD-10-CM | POA: Diagnosis not present

## 2014-07-31 DIAGNOSIS — I10 Essential (primary) hypertension: Secondary | ICD-10-CM | POA: Diagnosis not present

## 2014-07-31 DIAGNOSIS — N189 Chronic kidney disease, unspecified: Secondary | ICD-10-CM | POA: Insufficient documentation

## 2014-07-31 DIAGNOSIS — N058 Unspecified nephritic syndrome with other morphologic changes: Secondary | ICD-10-CM | POA: Diagnosis not present

## 2014-07-31 DIAGNOSIS — E785 Hyperlipidemia, unspecified: Secondary | ICD-10-CM | POA: Diagnosis not present

## 2014-07-31 DIAGNOSIS — N1831 Chronic kidney disease, stage 3a: Secondary | ICD-10-CM | POA: Insufficient documentation

## 2014-08-04 DIAGNOSIS — J449 Chronic obstructive pulmonary disease, unspecified: Secondary | ICD-10-CM | POA: Diagnosis not present

## 2014-08-04 DIAGNOSIS — R0602 Shortness of breath: Secondary | ICD-10-CM | POA: Diagnosis not present

## 2014-08-04 DIAGNOSIS — G4710 Hypersomnia, unspecified: Secondary | ICD-10-CM | POA: Diagnosis not present

## 2014-08-04 DIAGNOSIS — G4730 Sleep apnea, unspecified: Secondary | ICD-10-CM | POA: Diagnosis not present

## 2014-08-06 DIAGNOSIS — G40909 Epilepsy, unspecified, not intractable, without status epilepticus: Secondary | ICD-10-CM | POA: Diagnosis not present

## 2014-08-12 DIAGNOSIS — G4730 Sleep apnea, unspecified: Secondary | ICD-10-CM | POA: Diagnosis not present

## 2014-08-12 DIAGNOSIS — G4720 Circadian rhythm sleep disorder, unspecified type: Secondary | ICD-10-CM | POA: Diagnosis not present

## 2014-08-12 DIAGNOSIS — G4710 Hypersomnia, unspecified: Secondary | ICD-10-CM | POA: Diagnosis not present

## 2014-08-20 DIAGNOSIS — G4733 Obstructive sleep apnea (adult) (pediatric): Secondary | ICD-10-CM | POA: Diagnosis not present

## 2014-08-20 DIAGNOSIS — J449 Chronic obstructive pulmonary disease, unspecified: Secondary | ICD-10-CM | POA: Diagnosis not present

## 2014-08-26 DIAGNOSIS — Z86010 Personal history of colonic polyps: Secondary | ICD-10-CM | POA: Diagnosis not present

## 2014-08-29 ENCOUNTER — Ambulatory Visit: Payer: Self-pay | Admitting: Internal Medicine

## 2014-08-29 DIAGNOSIS — Z1231 Encounter for screening mammogram for malignant neoplasm of breast: Secondary | ICD-10-CM | POA: Diagnosis not present

## 2014-09-09 DIAGNOSIS — G40909 Epilepsy, unspecified, not intractable, without status epilepticus: Secondary | ICD-10-CM | POA: Diagnosis not present

## 2014-09-09 DIAGNOSIS — Z23 Encounter for immunization: Secondary | ICD-10-CM | POA: Diagnosis not present

## 2014-09-17 DIAGNOSIS — E780 Pure hypercholesterolemia: Secondary | ICD-10-CM | POA: Diagnosis not present

## 2014-09-17 DIAGNOSIS — E119 Type 2 diabetes mellitus without complications: Secondary | ICD-10-CM | POA: Diagnosis not present

## 2014-09-17 DIAGNOSIS — I10 Essential (primary) hypertension: Secondary | ICD-10-CM | POA: Diagnosis not present

## 2014-09-17 DIAGNOSIS — E1121 Type 2 diabetes mellitus with diabetic nephropathy: Secondary | ICD-10-CM | POA: Diagnosis not present

## 2014-09-25 DIAGNOSIS — G40909 Epilepsy, unspecified, not intractable, without status epilepticus: Secondary | ICD-10-CM | POA: Diagnosis not present

## 2014-09-26 ENCOUNTER — Ambulatory Visit: Payer: Self-pay | Admitting: Unknown Physician Specialty

## 2014-09-26 DIAGNOSIS — K621 Rectal polyp: Secondary | ICD-10-CM | POA: Diagnosis not present

## 2014-09-26 DIAGNOSIS — J449 Chronic obstructive pulmonary disease, unspecified: Secondary | ICD-10-CM | POA: Diagnosis not present

## 2014-09-26 DIAGNOSIS — I10 Essential (primary) hypertension: Secondary | ICD-10-CM | POA: Diagnosis not present

## 2014-09-26 DIAGNOSIS — Z885 Allergy status to narcotic agent status: Secondary | ICD-10-CM | POA: Diagnosis not present

## 2014-09-26 DIAGNOSIS — Z91041 Radiographic dye allergy status: Secondary | ICD-10-CM | POA: Diagnosis not present

## 2014-09-26 DIAGNOSIS — Z7982 Long term (current) use of aspirin: Secondary | ICD-10-CM | POA: Diagnosis not present

## 2014-09-26 DIAGNOSIS — K640 First degree hemorrhoids: Secondary | ICD-10-CM | POA: Diagnosis not present

## 2014-09-26 DIAGNOSIS — Z86010 Personal history of colonic polyps: Secondary | ICD-10-CM | POA: Diagnosis not present

## 2014-09-26 DIAGNOSIS — E119 Type 2 diabetes mellitus without complications: Secondary | ICD-10-CM | POA: Diagnosis not present

## 2014-09-26 DIAGNOSIS — G4730 Sleep apnea, unspecified: Secondary | ICD-10-CM | POA: Diagnosis not present

## 2014-09-26 DIAGNOSIS — N289 Disorder of kidney and ureter, unspecified: Secondary | ICD-10-CM | POA: Diagnosis not present

## 2014-09-26 DIAGNOSIS — Z87891 Personal history of nicotine dependence: Secondary | ICD-10-CM | POA: Diagnosis not present

## 2014-09-26 DIAGNOSIS — K635 Polyp of colon: Secondary | ICD-10-CM | POA: Diagnosis not present

## 2014-09-26 DIAGNOSIS — Z1211 Encounter for screening for malignant neoplasm of colon: Secondary | ICD-10-CM | POA: Diagnosis not present

## 2014-09-26 DIAGNOSIS — Z79899 Other long term (current) drug therapy: Secondary | ICD-10-CM | POA: Diagnosis not present

## 2014-09-26 DIAGNOSIS — D122 Benign neoplasm of ascending colon: Secondary | ICD-10-CM | POA: Diagnosis not present

## 2014-09-26 DIAGNOSIS — K5730 Diverticulosis of large intestine without perforation or abscess without bleeding: Secondary | ICD-10-CM | POA: Diagnosis not present

## 2014-10-21 DIAGNOSIS — E1121 Type 2 diabetes mellitus with diabetic nephropathy: Secondary | ICD-10-CM | POA: Diagnosis not present

## 2014-10-21 DIAGNOSIS — I10 Essential (primary) hypertension: Secondary | ICD-10-CM | POA: Diagnosis not present

## 2014-11-10 DIAGNOSIS — I10 Essential (primary) hypertension: Secondary | ICD-10-CM | POA: Diagnosis not present

## 2014-11-10 DIAGNOSIS — E1121 Type 2 diabetes mellitus with diabetic nephropathy: Secondary | ICD-10-CM | POA: Diagnosis not present

## 2014-11-10 DIAGNOSIS — R809 Proteinuria, unspecified: Secondary | ICD-10-CM | POA: Diagnosis not present

## 2014-11-26 DIAGNOSIS — G479 Sleep disorder, unspecified: Secondary | ICD-10-CM | POA: Diagnosis not present

## 2014-11-26 DIAGNOSIS — G40909 Epilepsy, unspecified, not intractable, without status epilepticus: Secondary | ICD-10-CM | POA: Diagnosis not present

## 2014-12-18 DIAGNOSIS — R0602 Shortness of breath: Secondary | ICD-10-CM | POA: Diagnosis not present

## 2014-12-18 DIAGNOSIS — G4733 Obstructive sleep apnea (adult) (pediatric): Secondary | ICD-10-CM | POA: Diagnosis not present

## 2014-12-18 DIAGNOSIS — J0140 Acute pansinusitis, unspecified: Secondary | ICD-10-CM | POA: Diagnosis not present

## 2014-12-18 DIAGNOSIS — J449 Chronic obstructive pulmonary disease, unspecified: Secondary | ICD-10-CM | POA: Diagnosis not present

## 2015-02-16 DIAGNOSIS — H3531 Nonexudative age-related macular degeneration: Secondary | ICD-10-CM | POA: Diagnosis not present

## 2015-02-26 DIAGNOSIS — R0602 Shortness of breath: Secondary | ICD-10-CM | POA: Diagnosis not present

## 2015-02-26 DIAGNOSIS — J441 Chronic obstructive pulmonary disease with (acute) exacerbation: Secondary | ICD-10-CM | POA: Diagnosis not present

## 2015-02-26 DIAGNOSIS — J301 Allergic rhinitis due to pollen: Secondary | ICD-10-CM | POA: Diagnosis not present

## 2015-02-26 DIAGNOSIS — G4733 Obstructive sleep apnea (adult) (pediatric): Secondary | ICD-10-CM | POA: Diagnosis not present

## 2015-03-03 DIAGNOSIS — G40909 Epilepsy, unspecified, not intractable, without status epilepticus: Secondary | ICD-10-CM | POA: Diagnosis not present

## 2015-03-03 DIAGNOSIS — I10 Essential (primary) hypertension: Secondary | ICD-10-CM | POA: Diagnosis not present

## 2015-03-03 DIAGNOSIS — G479 Sleep disorder, unspecified: Secondary | ICD-10-CM | POA: Diagnosis not present

## 2015-03-03 NOTE — Consult Note (Signed)
PATIENT NAME:  Angelica Juarez, TOMPSON MR#:  [id number] DATE OF BIRTH:  July 28, 1941  DATE OF CONSULTATION:  09/24/2012  REFERRING PHYSICIAN:  Dr. Bridgett Larsson CONSULTING PHYSICIAN:  Heinz Knuckles. Rekha Hobbins, MD  REASON FOR CONSULTATION: Pneumonia and SIRS.    HISTORY OF PRESENT ILLNESS: The patient is a 74 year old female with a past history significant for chronic obstructive pulmonary disease and diabetes who was admitted on 09/16/2012 with a several-day history of rapid onset of fevers, chills, sweats, and cough productive of bloody sputum. The patient initially was having some malaise and some fevers and chills. Over the next day she developed hematuria times one, which was not repeated, but then began coughing with hemoptysis. She continued to feel worse and was seen in Urgent Care and was found to have an elevated temperature and abnormal chest x-ray and was sent to the hospital for admission. On admission she was found to be hypotensive. She received IV fluids and her blood pressure improved. She was admitted to the Critical Care Unit. Her chest x-ray demonstrated a left upper lobe infiltrate and a CT scan of the chest showed a dense consolidation in the left upper lobe. There were some enlarged lymph nodes as well. There was a small amount of consolidation in the posterior right lower lobe. She was placed on isolation and AFB smears have been obtained times four and have all been negative. She currently remains on isolation. She was placed on vancomycin, Zosyn, and Levaquin. Sputum cultures have been obtained and are growing methicillin sensitive Staphylococcus aureus. She is feeling somewhat better. She is currently receiving a prednisone taper. She is still coughing occasionally but has no further hemoptysis. She is not having any fevers, chills, or sweats.   ALLERGIES: Iodine and phenobarbital.   PAST MEDICAL HISTORY:  1. Chronic obstructive pulmonary disease.  2. Diabetes.  3. Hypercholesterolemia.  4. Chronic neck  pain due to degenerative disk disease.  5. Status post bilateral knee arthroplasties.  6. Hypertension.  7. Fallopian tube cancer.  8. Sleep apnea on CPAP.  9. Seizure disorder.  10. Status post hysterectomy.   FAMILY HISTORY: Positive for hypertension.   SOCIAL HISTORY: She is a prior smoker, having quit many years ago. She does not drink. No injecting drug use history.    REVIEW OF SYSTEMS: GENERAL: Positive for fevers, chills, sweats, malaise, fatigue, and weakness.  HEENT: She has had some headaches and some sinus congestion. Some nasal congestion. Some sore throat. These have somewhat improved. NECK: No swollen glands. No neck stiffness. She does have chronic neck pain posteriorly. RESPIRATORY: Positive cough with hemoptysis and sputum production, shortness of breath, but this has been improving. She has some pleuritic chest pain on the left. CARDIAC: No chest pain other than the pleuritic pain described above. She has no peripheral edema. GI: No nausea, vomiting, abdominal pain, no change in her bowels. GU: She has had some dysuria and hematuria but the hematuria occurred once and has not repeated. MUSCULOSKELETAL: She has had general myalgias and arthralgias, but no frank arthritis. She has chronic joint complaints as well related to her arthritis. SKIN: No rashes. NEUROLOGIC: No focal weakness. PSYCHIATRIC: No complaints. All other systems are negative.   PHYSICAL EXAMINATION:  VITAL SIGNS: T-max 98.9, T-current 98.2, pulse 69, blood pressure 134/67, 93% on 3 liters.   GENERAL: 74 year old white female in no acute distress.   HEENT: Normocephalic, atraumatic. Pupils equal, reactive to light. Extraocular motion intact. Sclerae, conjunctivae, and lids are without evidence for emboli or petechiae.  Oropharynx shows no erythema or exudate. Teeth and gums are in fair condition.   NECK: Supple. Full range of motion. Midline trachea. No lymphadenopathy. No thyromegaly.   LUNGS: Clear to  auscultation bilaterally with good air movement. No focal consolidation.   HEART: Regular rate and rhythm without murmur, rub, or gallop.   ABDOMEN: Soft, nontender, and nondistended. No hepatosplenomegaly. No hernias noted.   EXTREMITIES: No evidence for tenosynovitis.   SKIN: No rashes. No stigmata of endocarditis, specifically no Janeway lesions or Osler nodes.   NEUROLOGIC: The patient is awake and interactive, moving all four extremities.   PSYCHIATRIC: Mood and affect appeared normal.   LABORATORY DATA: BUN 14, creatinine 0.62, bicarbonate 35, anion gap 4. LFTs from admission were slightly elevated with an AST of 52, ALT of 153, alkaline phosphatase of 132, total bilirubin of 2.2. Repeat on 11/04 showed AST 22, ALT 97, alkaline phosphatase 115, total bilirubin of 1.6. White count today was 16.4. Hemoglobin 13.5, platelet count 358, ANC of 14.4. White count on admission was 25.6 with an ANC of 22.9. Blood cultures from admission are negative times three. A sputum culture from admission showed normal flora. A repeat sputum culture from 11/07 is growing methicillin sensitive Staphylococcus aureus. AFB smears are negative times three. Legionella antigen is negative. A chest x-ray from admission shows left upper lobe and lingular infiltrate. A CT scan of the chest from admission shows a dense consolidation in much of the left upper lobe. There were enlarged mediastinal and left hilar lymph nodes. There was a small amount of consolidation in the posterior aspect of the right lower lobe. 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 There was splenomegaly and hepatomegaly present and an enlarged left adrenal gland. Ultrasound of the abdomen showed hepatosplenomegaly and a cyst in the left kidney. A chest x-ray from 11/07 showed an abnormal interstitial density in the left lung.   IMPRESSION: 74 year old female with a past history significant for diabetes and chronic obstructive pulmonary disease,  admitted with methicillin sensitive Staphylococcus aureus pneumonia.   RECOMMENDATIONS:  1. She initially appeared to be septic but her blood cultures are negative. She meets criteria for SIRS. Her blood pressure responded to IV fluids. Renal function has improved as well.  2. She is on broad-spectrum antibiotics. Her sputum culture grew methicillin sensitive Staphylococcus aureus. She is overall feeling better, but still has some leukocytosis.  3. Her AFBs are negative times four. We will discontinue isolation.  4. We will change her antibiotics to Keflex.  5. We will plan on 10 to 14 days of therapy total.      This is a moderately complex infectious disease case. Thank you very much for involving me in Ms. Meiring's care.   ____________________________ Heinz Knuckles. Smitty Ackerley, MD meb:bjt D: 09/24/2012 17:27:17 ET T: 09/25/2012 09:54:08 ET JOB#: [id number]  cc: Heinz Knuckles. Reinhardt Licausi, MD, <Dictator> Ennifer Harston E Verdine Grenfell MD ELECTRONICALLY SIGNED 09/25/2012 11:52

## 2015-03-03 NOTE — Consult Note (Signed)
Impression: 74yo female with h/o DM and COPD asthma admitted with Methacillin Sensitive Staph aureus pneumonia.  She initially appeared to be septic, but her BCx are negative.  She meets criteria for SIRS.  Her BP responded to IVF.  Her renal function has improved. She is on broad spectrum antibiotics.  Her sputum cx grew Methacillin Sensitive Staph aureus.  She is feeling better overall. Her AFBs are negative x4.  Will d/c isolation.   Will change her antibiotics to keflex. Would plan on 10-14 days of therapy total.    Electronic Signatures: Xylina Rhoads, Heinz Knuckles (MD) (Signed on 11-Nov-13 17:10)  Authored   Last Updated: 11-Nov-13 17:26 by Sebastiana Wuest, Heinz Knuckles (MD)

## 2015-03-03 NOTE — H&P (Signed)
PATIENT NAME:  Angelica Juarez, NOBIS MR#:  [id number] DATE OF BIRTH:  1941/08/03  DATE OF ADMISSION:  09/16/2012  PRIMARY CARE PHYSICIAN: Kathrine Haddock, NP with Dr. Golden Pop   CHIEF COMPLAINT: Fever, cough, and shortness of breath for the last 3 to 4 days.   HISTORY OF PRESENT ILLNESS: Angelica Juarez is a 74 year old very pleasant Caucasian female with multiple medical problems including chronic obstructive pulmonary disease, hypertension, and type 2 diabetes who comes to the Emergency Room from [facility] where she was seen for high-grade fever, shortness of breath, and cough with productive phlegm along with hemoptysis this morning. Her chest x-ray shows multilobar pneumonia, mainly on the left. She is being admitted for sepsis secondary to pneumonia and hypovolemic shock. The patient received a dose of Rocephin and Zithromax in the Emergency Room. She received also a liter of IV bolus fluids and her blood pressure is currently improved where it is 117/43. She is being admitted for further evaluation and management. The patient denies any sick contacts.   PAST MEDICAL HISTORY:  1. Environmental allergies.  2. Hyperlipidemia.  3. Chronic obstructive pulmonary disease.  4. Chronic neck pain.  5. Hypertension.  6. Type 2 diabetes.  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   8. Sleep apnea/CPAP.  9. Seizure disorder.  10. Right knee replacement.  11. Left knee surgery.  12. Hysterectomy.   ALLERGIES: Iodine, phenobarbital, and barbital adhesive tape.    MEDICATIONS:   1. Advair 250/50 1 puff b.i.d.  2. Amlodipine 10 mg daily.  3. Atenolol 50 mg daily.  4. Caltrate 600 plus vitamin D 1 tablet b.i.d.  5. Centrum Silver 1 tablet daily.  6. Clonidine 0.1 mg, 2 tablets twice a day.  7. Combivent 2 puffs twice a day.  8. Fexofenadine 180 mg p.o. daily.  9. Hydralazine 100 mg 3 times a day.  10. Hydrochlorothiazide/losartan 25 mg/200, 1 tablet daily.  11. Lipitor 40 mg at bedtime.  12. Meloxicam  15 mg at bedtime.  13. Metformin 500 mg 1 tablet twice a day.  14. Phenytoin 100-mg capsule extended release, 2 capsules in the morning and 3 capsules at bedtime.  15. Trazodone 50 mg at bedtime.  16. Vitamin C 1000 mg daily.  17. WelChol 625 mg, 3 tablets twice a day.   FAMILY HISTORY: Positive for hypertension.   SOCIAL HISTORY: She is retired from being a Physiological scientist. She is an  ex- heavy smoker, smoked for 20+ years, quit about 7 to 8 years ago. Denies any alcohol or drug use.     REVIEW OF SYSTEMS: CONSTITUTIONAL: Positive for fever, fatigue, and weakness. EYES: No blurred or double vision. No glaucoma. ENT: No tinnitus, hearing loss, ear pain, or postnasal drip.  RESPIRATORY: Positive for cough, wheeze, hemoptysis, and dyspnea. CARDIOVASCULAR: Positive for chest pain from coughing and shortness of breath. GASTROINTESTINAL: No nausea, vomiting, diarrhea, or abdominal pain. No gastroesophageal reflux disease. GENITOURINARY: No dysuria or hematuria or frequency.  ENDOCRINE: No polyuria, nocturia, or thyroid problems. HEMATOLOGY: No anemia or easy bruising. SKIN: No acne or rash. MUSCULOSKELETAL: Positive for arthritis. NEUROLOGIC: No cerebrovascular accident or transient ischemic attack. PSYCH: No anxiety or depression. All other systems reviewed and negative.   PHYSICAL EXAMINATION:  GENERAL: Patient is awake, alert, and oriented times three, mild to moderate distress due to shortness of breath.   VITAL SIGNS: Temperature 98.8, blood pressure on arrival was 85/40, sats were 93% on 3 liters nasal cannula oxygen.   HEENT: Atraumatic,  normocephalic. Pupils are equal, round, and reactive to light and accommodation. Extraocular movements intact. Oral mucosa is dry.   NECK: Supple. No JVD. No carotid bruit.   RESPIRATORY: She has coarse breath sounds bilaterally, decreased breath sounds in the bases. Scattered wheezing present more posteriorly. No use of accessory muscles or labored  breathing.   CARDIOVASCULAR: Mild tachycardia. No murmur heard. PMI not lateralized. Chest is nontender.   EXTREMITIES: Good femoral pulses.  Pedal pulses are feeble. Trace lower extremity edema.   ABDOMEN: Soft, benign, and nontender. No organomegaly. Positive bowel sounds.   NEUROLOGIC: Grossly intact cranial nerves II through XII. No motor or sensory deficits.   PSYCH: The patient is awake, alert, oriented times three.   SKIN: Warm and dry.   LABORATORY, DIAGNOSTIC, AND RADIOLOGICAL DATA:  EKG shows normal sinus rhythm. Chest x-ray shows multilobar pneumonia particularly in the left upper lingula and middle lobe. Right lung is clear.   White count is 25,000, hemoglobin and hematocrit 13 and 37.3. Glucose 134, BUN 33, creatinine 1.5, sodium 131, potassium 3.3, chloride 95, bicarbonate 22, calcium is 9.9, bilirubin 2.2, alkaline phosphatase 132, SGPT 153, SGOT 52, albumin 3.1.   ASSESSMENT: 74 year old Angelica Juarez with history of type 2 diabetes, hypertension, morbid obesity, and chronic obstructive pulmonary disease presents to the Emergency Room with cough, fever, shortness of breath, and abnormal chest x-ray. She is being admitted with:  1. Sepsis due to multilobar pneumonia: The patient's chest x-ray shows multilobar pneumonia on the left side. She presented with fever, tachycardia, tachypnea, and white count of 25,000. She received IV Rocephin and Zithromax. We will admit the patient to the Critical Care Unit. Continue IV antibiotics with Zosyn and IV Levaquin, higher doses.  Follow blood culture. Pulmonary consultation in the morning.  IV Solu-Medrol at 60 b.i.d.  Oral inhalers along with nebulizer and continue oxygen.  2. Acute hypoxic respiratory failure due to chronic obstructive pulmonary disease flare in the setting of pneumonia: We will give IV Solu-Medrol, continue oxygen, nebulizers, and oral inhalers. The patient is a former heavy smoker.  3. Hypovolemic, hypotensive shock due to  prerenal azotemia with poor p.o. intake in the setting of pneumonia for the last couple of days: Blood pressure improved somewhat after 1 liter of IV normal saline. We will continue IV fluids for now, if needed IV vasopressors. I will hold off on blood pressure medicines.  4. Type 2 diabetes: We will continue sliding scale for now. Hold off on metformin in the setting of renal failure.  5. Elevated liver function tests: Could be in the setting of sepsis, however, we will get an ultrasound of the abdomen to rule out any gallbladder etiology. The patient denies any abdominal pain at this time. I will hold off on patient's Lipitor and WelChol.   6. History of hypertension: Hold medications in the setting of relative hypotension.  7. Former smoker.  8. Deep vein thrombosis prophylaxis with heparin subcutaneous b.i.d.  9. Further work-up according to the patient's clinical course. Hospital admission plan was discussed with the patient and her husband.   CRITICAL TIME SPENT: 60 minutes.   ____________________________ Hart Rochester Posey Pronto, MD sap:bjt D: 09/16/2012 18:23:27 ET T: 09/17/2012 07:16:56 ET JOB#: [id number]  cc: Katiria Calame A. Posey Pronto, MD, <Dictator> Guadalupe Maple, MD Hervey Ard Julian Hy, NP Ilda Basset MD ELECTRONICALLY SIGNED 09/17/2012 12:45

## 2015-03-03 NOTE — Discharge Summary (Signed)
PATIENT NAME:  Angelica Juarez, Angelica Juarez MR#:  [id number] DATE OF BIRTH:  14-Jun-1941  DATE OF ADMISSION:  09/16/2012 DATE OF DISCHARGE:  09/25/2012  PRIMARY CARE PHYSICIAN: Kathrine Haddock, NP   CONSULTING PHYSICIANS:    1. Dr. Clayborn Bigness. 2. Dr. Humphrey Rolls, Pulmonary.   DISCHARGE DIAGNOSES:  1. Acute respiratory failure due to chronic obstructive pulmonary disease exacerbation and pneumonia.  2. Systemic inflammatory response syndrome.  3. Acute renal failure.  4. Hypertension.  5. Diabetes.   CODE STATUS:  FULL CODE.     CONDITION: Stable.   HOME MEDICATIONS: Please refer to the [facility] physician discharge instructions.   DIET: Low sodium, ADA diet.   ACTIVITY: As tolerated.   FOLLOW-UP CARE:  1. Follow up with primary care physician within one week.  2. Follow up with Pulmonary, Dr. Humphrey Rolls.  3. Follow up CBC.   REASON FOR ADMISSION: Fever, cough, shortness of breath for 3 to 4 days.   HOSPITAL COURSE: The patient is a 74 year old very pleasant Caucasian female with multiple medical problems including chronic obstructive pulmonary disease, hypertension, diabetes, presented to the ED with high-grade fever, shortness of breath, cough with productive sputum. Chest x-ray showed multilobar pneumonia mainly on the left side. She was admitted for sepsis secondary to pneumonia and hypovolemic shock. She received Rocephin, Zithromax in the ED, was given a liter of IV bolus. Blood pressure increased to 117/43. For a detailed history and physical examination, please refer to the admission note dictated by Dr. Fritzi Mandes on the admission date. The patient's white count was 25,000, hemoglobin 13, creatinine 1.5, BUN 33, potassium 3.3, bicarbonate 22. After admission, the patient has been treated with Zosyn, Levaquin, Solu-Medrol and nebulizer p.r.n. The patient has acute hypoxic respiratory failure due to chronic obstructive pulmonary disease exacerbation. She has been treated with Solu-Medrol, oxygen 3 liters by nasal  cannula. Since the patient had one episode of hemoptysis, Dr. Humphrey Rolls was consulted to rule out tuberculosis. The patient was in isolation; however, the patient's AFBs 4 times were negative. Dr. Clayborn Bigness was consulted, also. He suggested the patient has a pneumonia, and sputum culture showed MSSA. He suggested to discontinue IV antibiotics and then change to Keflex for a total of 14 days. The patient's white count has decreased to 13.4 today. Symptoms have much improved. She can walk without oxygen with oxygen saturation about 93%.   PHYSICAL EXAMINATION: Lung sounds are clear. No wheezing or rales. No crackles or rhonchi. The patient is clinically stable and will be discharged to home today.   I discussed the patient's discharge plan with the patient, case manager and nurse.   TIME SPENT: About 42 minutes.  ____________________________ Demetrios Loll, MD qc:cbb D: 09/25/2012 15:35:02 ET T: 09/26/2012 11:14:26 ET JOB#: [id number]  cc: Demetrios Loll, MD, <Dictator> Cheryl A. Julian Hy, NP Demetrios Loll MD ELECTRONICALLY SIGNED 09/27/2012 22:15

## 2015-03-05 DIAGNOSIS — I10 Essential (primary) hypertension: Secondary | ICD-10-CM | POA: Insufficient documentation

## 2015-03-08 NOTE — Op Note (Signed)
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    POSTOPERATIVE DIAGNOSIS: Nasal obstruction secondary to septal deformity and bilateral inferior turbinate hypertrophy.   PROCEDURES:    1. Septoplasty.  2. Bilateral submucous resection of the inferior turbinates.   SURGEON: Janalee Dane, M.D.   ANESTHESIA:  General  COMPLICATIONS:  None.  ESTIMATED BLOOD LOSS: 10 mL. One unit of Surgiflo   FINDINGS: The septum was deviated to the left at the bony cartilaginous junction and there was deflection of the inferior septum out of the maxillary crest into the left side of the nose. There was a high right septal deviation. The inferior turbinates were severely hypertrophied.   DESCRIPTION OF PROCEDURE:  The patient was identified in the holding area and was brought back to the operating room in the supine position on the operating room table.  After general endotracheal anesthesia had been induced the patient was turned 90 degrees counter clockwise from anesthesia.  The nose was anesthetized with infraorbital nerve blocks and septal injection with 0.5% Lidocaine and 0.25% Bupivacaine mixed with 1:150,000 with Epinephrine and phenylephrine Lidocaine soaked pledgets, two on each side were placed and the face was prepped and draped in the usual fashion.  The pledgets were removed.  A 15 blade was used to make a left-sided hemitransfixion incision and septal mucoperichondrial mucoperiosteal leaflets elevated.  There was a large inferior spur that was resected with Jansen-Middleton forceps.  The remaining septum was deviated back and forth in an accordion like fashion.  The bony cartilaginous junction was then divided and a moderate amount of vomer and perpendicular plate was taken down with Jansen-Middleton forceps, releasing the  tension on the remaining septum.  The septum then swung back into the midline.  The septal leaflets were closed with quilting 4-0 chromic suture.  The left sided hemitransfixion incision was closed with 4-0 plain gut.  Attention was directed to the turbinates which had been previously injected on the left.  The head of the inferior turbinate on the left was incised with a 15 blade and the medial mucoperiosteum was elevated using a Psychologist, educational.  Once this had been elevated Knight scissors were used to resect the conchal bone and lateral mucoperiosteum.  The inferior margin of the remaining mucoperiosteum was then cauterized with suction cautery and Surgiflo was placed at the inferior to the inferior margin of the remaining inferior turbinate.  An identical procedure was performed on the right inferior turbinate with once again placement of Surgiflo along its inferior margin.  Temporary Telfa pledgets were then placed.  The patient was allowed to emerge from anesthesia, extubated in the operating room and taken to the recovery room in stable condition.  There were no complications.  Estimated blood loss was less than 10 milliliters.   ____________________________ Lenna Sciara. Nadeen Landau, MD jmc:ap D: 11/24/2011 11:39:43 ET T: 11/24/2011 12:32:19 ET JOB#: [id number]  cc: Janalee Dane, MD, <Dictator> Nicholos Johns MD ELECTRONICALLY SIGNED 12/23/2011 7:38

## 2015-03-09 LAB — SURGICAL PATHOLOGY

## 2015-03-26 DIAGNOSIS — J301 Allergic rhinitis due to pollen: Secondary | ICD-10-CM | POA: Diagnosis not present

## 2015-03-27 DIAGNOSIS — R809 Proteinuria, unspecified: Secondary | ICD-10-CM | POA: Diagnosis not present

## 2015-03-27 DIAGNOSIS — J301 Allergic rhinitis due to pollen: Secondary | ICD-10-CM | POA: Diagnosis not present

## 2015-03-27 DIAGNOSIS — E1121 Type 2 diabetes mellitus with diabetic nephropathy: Secondary | ICD-10-CM | POA: Diagnosis not present

## 2015-03-27 DIAGNOSIS — I10 Essential (primary) hypertension: Secondary | ICD-10-CM | POA: Diagnosis not present

## 2015-04-06 DIAGNOSIS — J301 Allergic rhinitis due to pollen: Secondary | ICD-10-CM | POA: Diagnosis not present

## 2015-04-09 DIAGNOSIS — I10 Essential (primary) hypertension: Secondary | ICD-10-CM | POA: Diagnosis not present

## 2015-04-09 DIAGNOSIS — E1121 Type 2 diabetes mellitus with diabetic nephropathy: Secondary | ICD-10-CM | POA: Diagnosis not present

## 2015-04-09 DIAGNOSIS — Z8679 Personal history of other diseases of the circulatory system: Secondary | ICD-10-CM | POA: Diagnosis not present

## 2015-04-09 DIAGNOSIS — E780 Pure hypercholesterolemia: Secondary | ICD-10-CM | POA: Diagnosis not present

## 2015-04-14 DIAGNOSIS — J301 Allergic rhinitis due to pollen: Secondary | ICD-10-CM | POA: Diagnosis not present

## 2015-04-20 DIAGNOSIS — J301 Allergic rhinitis due to pollen: Secondary | ICD-10-CM | POA: Diagnosis not present

## 2015-04-27 DIAGNOSIS — J301 Allergic rhinitis due to pollen: Secondary | ICD-10-CM | POA: Diagnosis not present

## 2015-05-06 DIAGNOSIS — J301 Allergic rhinitis due to pollen: Secondary | ICD-10-CM | POA: Diagnosis not present

## 2015-05-13 DIAGNOSIS — J301 Allergic rhinitis due to pollen: Secondary | ICD-10-CM | POA: Diagnosis not present

## 2015-05-20 DIAGNOSIS — J301 Allergic rhinitis due to pollen: Secondary | ICD-10-CM | POA: Diagnosis not present

## 2015-05-27 DIAGNOSIS — J301 Allergic rhinitis due to pollen: Secondary | ICD-10-CM | POA: Diagnosis not present

## 2015-06-03 DIAGNOSIS — J301 Allergic rhinitis due to pollen: Secondary | ICD-10-CM | POA: Diagnosis not present

## 2015-06-10 DIAGNOSIS — J301 Allergic rhinitis due to pollen: Secondary | ICD-10-CM | POA: Diagnosis not present

## 2015-06-24 DIAGNOSIS — J301 Allergic rhinitis due to pollen: Secondary | ICD-10-CM | POA: Diagnosis not present

## 2015-07-01 DIAGNOSIS — J301 Allergic rhinitis due to pollen: Secondary | ICD-10-CM | POA: Diagnosis not present

## 2015-07-02 ENCOUNTER — Other Ambulatory Visit: Payer: Self-pay | Admitting: Unknown Physician Specialty

## 2015-07-04 DIAGNOSIS — J301 Allergic rhinitis due to pollen: Secondary | ICD-10-CM | POA: Diagnosis not present

## 2015-07-08 DIAGNOSIS — R0602 Shortness of breath: Secondary | ICD-10-CM | POA: Diagnosis not present

## 2015-07-08 DIAGNOSIS — J301 Allergic rhinitis due to pollen: Secondary | ICD-10-CM | POA: Diagnosis not present

## 2015-07-13 DIAGNOSIS — J449 Chronic obstructive pulmonary disease, unspecified: Secondary | ICD-10-CM | POA: Insufficient documentation

## 2015-07-13 DIAGNOSIS — E1129 Type 2 diabetes mellitus with other diabetic kidney complication: Secondary | ICD-10-CM | POA: Insufficient documentation

## 2015-07-13 DIAGNOSIS — E782 Mixed hyperlipidemia: Secondary | ICD-10-CM | POA: Diagnosis not present

## 2015-07-13 DIAGNOSIS — I10 Essential (primary) hypertension: Secondary | ICD-10-CM | POA: Diagnosis not present

## 2015-07-13 DIAGNOSIS — R809 Proteinuria, unspecified: Secondary | ICD-10-CM

## 2015-07-15 DIAGNOSIS — J301 Allergic rhinitis due to pollen: Secondary | ICD-10-CM | POA: Diagnosis not present

## 2015-07-21 DIAGNOSIS — J301 Allergic rhinitis due to pollen: Secondary | ICD-10-CM | POA: Diagnosis not present

## 2015-07-21 DIAGNOSIS — J449 Chronic obstructive pulmonary disease, unspecified: Secondary | ICD-10-CM | POA: Diagnosis not present

## 2015-07-21 DIAGNOSIS — G4733 Obstructive sleep apnea (adult) (pediatric): Secondary | ICD-10-CM | POA: Diagnosis not present

## 2015-07-23 DIAGNOSIS — R809 Proteinuria, unspecified: Secondary | ICD-10-CM | POA: Diagnosis not present

## 2015-07-23 DIAGNOSIS — I10 Essential (primary) hypertension: Secondary | ICD-10-CM | POA: Diagnosis not present

## 2015-07-23 DIAGNOSIS — E1121 Type 2 diabetes mellitus with diabetic nephropathy: Secondary | ICD-10-CM | POA: Diagnosis not present

## 2015-07-29 DIAGNOSIS — J301 Allergic rhinitis due to pollen: Secondary | ICD-10-CM | POA: Diagnosis not present

## 2015-08-05 DIAGNOSIS — J301 Allergic rhinitis due to pollen: Secondary | ICD-10-CM | POA: Diagnosis not present

## 2015-08-12 DIAGNOSIS — J301 Allergic rhinitis due to pollen: Secondary | ICD-10-CM | POA: Diagnosis not present

## 2015-08-20 DIAGNOSIS — J301 Allergic rhinitis due to pollen: Secondary | ICD-10-CM | POA: Diagnosis not present

## 2015-08-20 DIAGNOSIS — E1121 Type 2 diabetes mellitus with diabetic nephropathy: Secondary | ICD-10-CM | POA: Diagnosis not present

## 2015-08-20 DIAGNOSIS — I10 Essential (primary) hypertension: Secondary | ICD-10-CM | POA: Diagnosis not present

## 2015-08-20 DIAGNOSIS — R809 Proteinuria, unspecified: Secondary | ICD-10-CM | POA: Diagnosis not present

## 2015-08-26 DIAGNOSIS — J301 Allergic rhinitis due to pollen: Secondary | ICD-10-CM | POA: Diagnosis not present

## 2015-09-02 DIAGNOSIS — J301 Allergic rhinitis due to pollen: Secondary | ICD-10-CM | POA: Diagnosis not present

## 2015-09-09 DIAGNOSIS — J301 Allergic rhinitis due to pollen: Secondary | ICD-10-CM | POA: Diagnosis not present

## 2015-09-16 DIAGNOSIS — J301 Allergic rhinitis due to pollen: Secondary | ICD-10-CM | POA: Diagnosis not present

## 2015-09-16 DIAGNOSIS — Z23 Encounter for immunization: Secondary | ICD-10-CM | POA: Diagnosis not present

## 2015-09-17 DIAGNOSIS — E1121 Type 2 diabetes mellitus with diabetic nephropathy: Secondary | ICD-10-CM | POA: Diagnosis not present

## 2015-09-17 DIAGNOSIS — I10 Essential (primary) hypertension: Secondary | ICD-10-CM | POA: Diagnosis not present

## 2015-09-17 DIAGNOSIS — R809 Proteinuria, unspecified: Secondary | ICD-10-CM | POA: Diagnosis not present

## 2015-09-23 DIAGNOSIS — J301 Allergic rhinitis due to pollen: Secondary | ICD-10-CM | POA: Diagnosis not present

## 2015-09-30 DIAGNOSIS — J301 Allergic rhinitis due to pollen: Secondary | ICD-10-CM | POA: Diagnosis not present

## 2015-10-05 DIAGNOSIS — E782 Mixed hyperlipidemia: Secondary | ICD-10-CM | POA: Diagnosis not present

## 2015-10-05 DIAGNOSIS — E1129 Type 2 diabetes mellitus with other diabetic kidney complication: Secondary | ICD-10-CM | POA: Diagnosis not present

## 2015-10-05 DIAGNOSIS — R809 Proteinuria, unspecified: Secondary | ICD-10-CM | POA: Diagnosis not present

## 2015-10-05 DIAGNOSIS — I10 Essential (primary) hypertension: Secondary | ICD-10-CM | POA: Diagnosis not present

## 2015-10-07 DIAGNOSIS — J301 Allergic rhinitis due to pollen: Secondary | ICD-10-CM | POA: Diagnosis not present

## 2015-10-13 DIAGNOSIS — E1129 Type 2 diabetes mellitus with other diabetic kidney complication: Secondary | ICD-10-CM | POA: Diagnosis not present

## 2015-10-13 DIAGNOSIS — N189 Chronic kidney disease, unspecified: Secondary | ICD-10-CM | POA: Diagnosis not present

## 2015-10-13 DIAGNOSIS — I10 Essential (primary) hypertension: Secondary | ICD-10-CM | POA: Diagnosis not present

## 2015-10-13 DIAGNOSIS — E7800 Pure hypercholesterolemia, unspecified: Secondary | ICD-10-CM | POA: Diagnosis not present

## 2015-10-13 DIAGNOSIS — R809 Proteinuria, unspecified: Secondary | ICD-10-CM | POA: Diagnosis not present

## 2015-10-14 ENCOUNTER — Other Ambulatory Visit: Payer: Self-pay | Admitting: Internal Medicine

## 2015-10-14 DIAGNOSIS — R809 Proteinuria, unspecified: Secondary | ICD-10-CM | POA: Diagnosis not present

## 2015-10-14 DIAGNOSIS — Z9989 Dependence on other enabling machines and devices: Secondary | ICD-10-CM

## 2015-10-14 DIAGNOSIS — Z6838 Body mass index (BMI) 38.0-38.9, adult: Secondary | ICD-10-CM | POA: Diagnosis not present

## 2015-10-14 DIAGNOSIS — Z1239 Encounter for other screening for malignant neoplasm of breast: Secondary | ICD-10-CM

## 2015-10-14 DIAGNOSIS — G4733 Obstructive sleep apnea (adult) (pediatric): Secondary | ICD-10-CM | POA: Diagnosis not present

## 2015-10-14 DIAGNOSIS — J45909 Unspecified asthma, uncomplicated: Secondary | ICD-10-CM | POA: Diagnosis not present

## 2015-10-14 DIAGNOSIS — E1129 Type 2 diabetes mellitus with other diabetic kidney complication: Secondary | ICD-10-CM | POA: Diagnosis not present

## 2015-10-14 DIAGNOSIS — J449 Chronic obstructive pulmonary disease, unspecified: Secondary | ICD-10-CM | POA: Diagnosis not present

## 2015-10-14 DIAGNOSIS — J301 Allergic rhinitis due to pollen: Secondary | ICD-10-CM | POA: Diagnosis not present

## 2015-10-14 DIAGNOSIS — I10 Essential (primary) hypertension: Secondary | ICD-10-CM | POA: Diagnosis not present

## 2015-10-14 DIAGNOSIS — E782 Mixed hyperlipidemia: Secondary | ICD-10-CM | POA: Diagnosis not present

## 2015-10-22 ENCOUNTER — Ambulatory Visit: Payer: Self-pay

## 2015-10-22 ENCOUNTER — Other Ambulatory Visit: Payer: Self-pay | Admitting: Internal Medicine

## 2015-10-22 ENCOUNTER — Ambulatory Visit
Admission: RE | Admit: 2015-10-22 | Discharge: 2015-10-22 | Disposition: A | Discharge status: Home / Self Care | Payer: Medicare Other | Source: Ambulatory Visit | Attending: Internal Medicine | Admitting: Internal Medicine

## 2015-10-22 DIAGNOSIS — Z1231 Encounter for screening mammogram for malignant neoplasm of breast: Secondary | ICD-10-CM | POA: Insufficient documentation

## 2015-10-22 DIAGNOSIS — Z1239 Encounter for other screening for malignant neoplasm of breast: Secondary | ICD-10-CM

## 2015-10-22 DIAGNOSIS — J301 Allergic rhinitis due to pollen: Secondary | ICD-10-CM | POA: Diagnosis not present

## 2015-10-22 HISTORY — DX: Malignant (primary) neoplasm, unspecified: C80.1

## 2015-10-27 DIAGNOSIS — M1812 Unilateral primary osteoarthritis of first carpometacarpal joint, left hand: Secondary | ICD-10-CM | POA: Insufficient documentation

## 2015-10-27 DIAGNOSIS — M25511 Pain in right shoulder: Secondary | ICD-10-CM | POA: Insufficient documentation

## 2015-10-28 DIAGNOSIS — J301 Allergic rhinitis due to pollen: Secondary | ICD-10-CM | POA: Diagnosis not present

## 2015-11-11 DIAGNOSIS — J301 Allergic rhinitis due to pollen: Secondary | ICD-10-CM | POA: Diagnosis not present

## 2015-11-17 DIAGNOSIS — J301 Allergic rhinitis due to pollen: Secondary | ICD-10-CM | POA: Diagnosis not present

## 2015-11-24 DIAGNOSIS — J301 Allergic rhinitis due to pollen: Secondary | ICD-10-CM | POA: Diagnosis not present

## 2015-11-24 DIAGNOSIS — J449 Chronic obstructive pulmonary disease, unspecified: Secondary | ICD-10-CM | POA: Diagnosis not present

## 2015-11-24 DIAGNOSIS — G4733 Obstructive sleep apnea (adult) (pediatric): Secondary | ICD-10-CM | POA: Diagnosis not present

## 2015-12-02 DIAGNOSIS — J301 Allergic rhinitis due to pollen: Secondary | ICD-10-CM | POA: Diagnosis not present

## 2015-12-09 DIAGNOSIS — J301 Allergic rhinitis due to pollen: Secondary | ICD-10-CM | POA: Diagnosis not present

## 2015-12-16 DIAGNOSIS — J301 Allergic rhinitis due to pollen: Secondary | ICD-10-CM | POA: Diagnosis not present

## 2015-12-24 DIAGNOSIS — J301 Allergic rhinitis due to pollen: Secondary | ICD-10-CM | POA: Diagnosis not present

## 2015-12-29 DIAGNOSIS — M7541 Impingement syndrome of right shoulder: Secondary | ICD-10-CM | POA: Diagnosis not present

## 2015-12-29 HISTORY — DX: Impingement syndrome of right shoulder: M75.41

## 2015-12-31 DIAGNOSIS — J301 Allergic rhinitis due to pollen: Secondary | ICD-10-CM | POA: Diagnosis not present

## 2016-01-06 DIAGNOSIS — J301 Allergic rhinitis due to pollen: Secondary | ICD-10-CM | POA: Diagnosis not present

## 2016-01-07 DIAGNOSIS — M7581 Other shoulder lesions, right shoulder: Secondary | ICD-10-CM | POA: Diagnosis not present

## 2016-01-07 DIAGNOSIS — M19011 Primary osteoarthritis, right shoulder: Secondary | ICD-10-CM | POA: Diagnosis not present

## 2016-01-07 DIAGNOSIS — M67813 Other specified disorders of tendon, right shoulder: Secondary | ICD-10-CM | POA: Diagnosis not present

## 2016-01-07 DIAGNOSIS — M7551 Bursitis of right shoulder: Secondary | ICD-10-CM | POA: Diagnosis not present

## 2016-01-07 DIAGNOSIS — M7541 Impingement syndrome of right shoulder: Secondary | ICD-10-CM | POA: Diagnosis not present

## 2016-01-07 DIAGNOSIS — M94211 Chondromalacia, right shoulder: Secondary | ICD-10-CM | POA: Diagnosis not present

## 2016-01-07 DIAGNOSIS — M24811 Other specific joint derangements of right shoulder, not elsewhere classified: Secondary | ICD-10-CM | POA: Diagnosis not present

## 2016-01-07 DIAGNOSIS — M75121 Complete rotator cuff tear or rupture of right shoulder, not specified as traumatic: Secondary | ICD-10-CM | POA: Diagnosis not present

## 2016-01-12 DIAGNOSIS — M7541 Impingement syndrome of right shoulder: Secondary | ICD-10-CM | POA: Diagnosis not present

## 2016-01-13 DIAGNOSIS — J301 Allergic rhinitis due to pollen: Secondary | ICD-10-CM | POA: Diagnosis not present

## 2016-01-19 DIAGNOSIS — E1121 Type 2 diabetes mellitus with diabetic nephropathy: Secondary | ICD-10-CM | POA: Diagnosis not present

## 2016-01-19 DIAGNOSIS — R809 Proteinuria, unspecified: Secondary | ICD-10-CM | POA: Diagnosis not present

## 2016-01-19 DIAGNOSIS — I10 Essential (primary) hypertension: Secondary | ICD-10-CM | POA: Diagnosis not present

## 2016-01-27 DIAGNOSIS — J301 Allergic rhinitis due to pollen: Secondary | ICD-10-CM | POA: Diagnosis not present

## 2016-02-10 DIAGNOSIS — J301 Allergic rhinitis due to pollen: Secondary | ICD-10-CM | POA: Diagnosis not present

## 2016-02-17 DIAGNOSIS — J301 Allergic rhinitis due to pollen: Secondary | ICD-10-CM | POA: Diagnosis not present

## 2016-02-24 DIAGNOSIS — J301 Allergic rhinitis due to pollen: Secondary | ICD-10-CM | POA: Diagnosis not present

## 2016-03-03 DIAGNOSIS — J301 Allergic rhinitis due to pollen: Secondary | ICD-10-CM | POA: Diagnosis not present

## 2016-03-10 DIAGNOSIS — J449 Chronic obstructive pulmonary disease, unspecified: Secondary | ICD-10-CM | POA: Diagnosis not present

## 2016-03-10 DIAGNOSIS — G4733 Obstructive sleep apnea (adult) (pediatric): Secondary | ICD-10-CM | POA: Diagnosis not present

## 2016-03-10 DIAGNOSIS — J301 Allergic rhinitis due to pollen: Secondary | ICD-10-CM | POA: Diagnosis not present

## 2016-03-23 DIAGNOSIS — J301 Allergic rhinitis due to pollen: Secondary | ICD-10-CM | POA: Diagnosis not present

## 2016-03-30 DIAGNOSIS — J301 Allergic rhinitis due to pollen: Secondary | ICD-10-CM | POA: Diagnosis not present

## 2016-04-05 DIAGNOSIS — J301 Allergic rhinitis due to pollen: Secondary | ICD-10-CM | POA: Diagnosis not present

## 2016-04-07 DIAGNOSIS — R0789 Other chest pain: Secondary | ICD-10-CM | POA: Insufficient documentation

## 2016-04-07 DIAGNOSIS — G4733 Obstructive sleep apnea (adult) (pediatric): Secondary | ICD-10-CM | POA: Diagnosis not present

## 2016-04-07 DIAGNOSIS — E7800 Pure hypercholesterolemia, unspecified: Secondary | ICD-10-CM | POA: Diagnosis not present

## 2016-04-07 DIAGNOSIS — E1129 Type 2 diabetes mellitus with other diabetic kidney complication: Secondary | ICD-10-CM | POA: Diagnosis not present

## 2016-04-07 DIAGNOSIS — I10 Essential (primary) hypertension: Secondary | ICD-10-CM | POA: Diagnosis not present

## 2016-04-07 DIAGNOSIS — R809 Proteinuria, unspecified: Secondary | ICD-10-CM | POA: Diagnosis not present

## 2016-04-07 DIAGNOSIS — Z9989 Dependence on other enabling machines and devices: Secondary | ICD-10-CM | POA: Diagnosis not present

## 2016-04-13 DIAGNOSIS — I10 Essential (primary) hypertension: Secondary | ICD-10-CM | POA: Diagnosis not present

## 2016-04-13 DIAGNOSIS — E7800 Pure hypercholesterolemia, unspecified: Secondary | ICD-10-CM | POA: Diagnosis not present

## 2016-04-13 DIAGNOSIS — R809 Proteinuria, unspecified: Secondary | ICD-10-CM | POA: Diagnosis not present

## 2016-04-13 DIAGNOSIS — R0789 Other chest pain: Secondary | ICD-10-CM | POA: Diagnosis not present

## 2016-04-13 DIAGNOSIS — E1129 Type 2 diabetes mellitus with other diabetic kidney complication: Secondary | ICD-10-CM | POA: Diagnosis not present

## 2016-04-15 DIAGNOSIS — J301 Allergic rhinitis due to pollen: Secondary | ICD-10-CM | POA: Diagnosis not present

## 2016-04-20 DIAGNOSIS — J301 Allergic rhinitis due to pollen: Secondary | ICD-10-CM | POA: Diagnosis not present

## 2016-04-21 DIAGNOSIS — M7541 Impingement syndrome of right shoulder: Secondary | ICD-10-CM | POA: Diagnosis not present

## 2016-04-21 DIAGNOSIS — M75101 Unspecified rotator cuff tear or rupture of right shoulder, not specified as traumatic: Secondary | ICD-10-CM | POA: Diagnosis not present

## 2016-04-21 DIAGNOSIS — M75100 Unspecified rotator cuff tear or rupture of unspecified shoulder, not specified as traumatic: Secondary | ICD-10-CM

## 2016-04-21 HISTORY — DX: Unspecified rotator cuff tear or rupture of unspecified shoulder, not specified as traumatic: M75.100

## 2016-04-26 DIAGNOSIS — R809 Proteinuria, unspecified: Secondary | ICD-10-CM | POA: Diagnosis not present

## 2016-04-26 DIAGNOSIS — I10 Essential (primary) hypertension: Secondary | ICD-10-CM | POA: Diagnosis not present

## 2016-04-26 DIAGNOSIS — E1121 Type 2 diabetes mellitus with diabetic nephropathy: Secondary | ICD-10-CM | POA: Diagnosis not present

## 2016-04-27 DIAGNOSIS — R809 Proteinuria, unspecified: Secondary | ICD-10-CM | POA: Diagnosis not present

## 2016-04-27 DIAGNOSIS — E1121 Type 2 diabetes mellitus with diabetic nephropathy: Secondary | ICD-10-CM | POA: Diagnosis not present

## 2016-04-27 DIAGNOSIS — I10 Essential (primary) hypertension: Secondary | ICD-10-CM | POA: Diagnosis not present

## 2016-04-28 DIAGNOSIS — J301 Allergic rhinitis due to pollen: Secondary | ICD-10-CM | POA: Diagnosis not present

## 2016-05-04 DIAGNOSIS — J301 Allergic rhinitis due to pollen: Secondary | ICD-10-CM | POA: Diagnosis not present

## 2016-05-12 ENCOUNTER — Encounter
Admission: RE | Admit: 2016-05-12 | Discharge: 2016-05-12 | Disposition: A | Discharge status: Home / Self Care | Payer: Medicare Other | Source: Ambulatory Visit | Attending: Unknown Physician Specialty | Admitting: Unknown Physician Specialty

## 2016-05-12 DIAGNOSIS — Z01818 Encounter for other preprocedural examination: Secondary | ICD-10-CM | POA: Insufficient documentation

## 2016-05-12 HISTORY — DX: Polyp of colon: K63.5

## 2016-05-12 HISTORY — DX: Pneumonia, unspecified organism: J18.9

## 2016-05-12 HISTORY — DX: Sleep apnea, unspecified: G47.30

## 2016-05-12 HISTORY — DX: Unspecified osteoarthritis, unspecified site: M19.90

## 2016-05-12 HISTORY — DX: Type 2 diabetes mellitus without complications: E11.9

## 2016-05-12 HISTORY — DX: Unspecified rotator cuff tear or rupture of unspecified shoulder, not specified as traumatic: M75.100

## 2016-05-12 HISTORY — DX: Hyperlipidemia, unspecified: E78.5

## 2016-05-12 HISTORY — DX: Unspecified fracture of shaft of humerus, unspecified arm, initial encounter for closed fracture: S42.309A

## 2016-05-12 HISTORY — DX: Essential (primary) hypertension: I10

## 2016-05-12 HISTORY — DX: Chronic kidney disease, unspecified: N18.9

## 2016-05-12 HISTORY — DX: Impingement syndrome of right shoulder: M75.41

## 2016-05-12 HISTORY — DX: Chronic obstructive pulmonary disease, unspecified: J44.9

## 2016-05-12 HISTORY — DX: Unspecified convulsions: R56.9

## 2016-05-12 NOTE — Patient Instructions (Addendum)
  Your procedure is scheduled on:7/12 Report to Day Surgery. To find out your arrival time please call [phone] between 1PM - 3PM on 7/11 Tues..  Remember: Instructions that are not followed completely may result in serious medical risk, up to and including death, or upon the discretion of your surgeon and anesthesiologist your surgery may need to be rescheduled.    __x__ 1. Do not eat food or drink liquids after midnight. No gum chewing or hard candies.     ___x_ 2. No Alcohol for 24 hours before or after surgery.   __x__ 3. Do Not Smoke For 24 Hours Prior to Your Surgery.   ____ 4. Bring all medications with you on the day of surgery if instructed.    __x__ 5. Notify your doctor if there is any change in your medical condition     (cold, fever, infections).       Do not wear jewelry, make-up, hairpins, clips or nail polish.  Do not wear lotions, powders, or perfumes. You may wear deodorant.  Do not shave 48 hours prior to surgery. Men may shave face and neck.  Do not bring valuables to the hospital.    [facility] is not responsible for any belongings or valuables.               Contacts, dentures or bridgework may not be worn into surgery.  Leave your suitcase in the car. After surgery it may be brought to your room.  For patients admitted to the hospital, discharge time is determined by your                treatment team.   Patients discharged the day of surgery will not be allowed to drive home.   Please read over the following fact sheets that you were given:   Surgical Site Infection Prevention   ____ Take these medicines the morning of surgery with A SIP OF WATER:    1. hydralazine  2. Tramadol if needed  3. keppra  4. fluticasone  5. valsartan/hctz bring with you if you are taking in the AM  6.  ____ Fleet Enema (as directed)   _x___ Use CHG Soap as directed  __x__ Use inhalers on the day of surgery  __x__ Stop metformin 2 days prior to surgery  Mon 7/10   ____ Take 1/2 of usual insulin dose the night before surgery and none on the morning of surgery.   _x___ Stop aspirin on 7/7  ____ Stop Anti-inflammatories on    __x__ Stop supplements until after surgery. Vitamin C stop on 7/5   __x__ Bring C-Pap to the hospital.

## 2016-05-12 NOTE — Pre-Procedure Instructions (Signed)
Negative Lexiscan stress. LV function normal. No evidence of reversible  ischemia. Low risk study. On 04/13/16

## 2016-05-12 NOTE — Pre-Procedure Instructions (Signed)
Left message for patient to stop asa on 05/20/14 per Dr. Bethanne Ginger recommendation

## 2016-05-12 NOTE — Pre-Procedure Instructions (Deleted)
Negative Lexiscan stress. LV function normal. No evidence of reversible  ischemia. Low risk study.on 04/13/16

## 2016-05-18 DIAGNOSIS — J301 Allergic rhinitis due to pollen: Secondary | ICD-10-CM | POA: Diagnosis not present

## 2016-05-19 DIAGNOSIS — J449 Chronic obstructive pulmonary disease, unspecified: Secondary | ICD-10-CM | POA: Diagnosis not present

## 2016-05-19 DIAGNOSIS — E7800 Pure hypercholesterolemia, unspecified: Secondary | ICD-10-CM | POA: Diagnosis not present

## 2016-05-19 DIAGNOSIS — I10 Essential (primary) hypertension: Secondary | ICD-10-CM | POA: Diagnosis not present

## 2016-05-19 DIAGNOSIS — G40909 Epilepsy, unspecified, not intractable, without status epilepticus: Secondary | ICD-10-CM | POA: Diagnosis not present

## 2016-05-19 DIAGNOSIS — G4733 Obstructive sleep apnea (adult) (pediatric): Secondary | ICD-10-CM | POA: Diagnosis not present

## 2016-05-19 DIAGNOSIS — J3089 Other allergic rhinitis: Secondary | ICD-10-CM | POA: Diagnosis not present

## 2016-05-19 DIAGNOSIS — Z9989 Dependence on other enabling machines and devices: Secondary | ICD-10-CM | POA: Diagnosis not present

## 2016-05-19 DIAGNOSIS — R809 Proteinuria, unspecified: Secondary | ICD-10-CM | POA: Diagnosis not present

## 2016-05-19 DIAGNOSIS — E1129 Type 2 diabetes mellitus with other diabetic kidney complication: Secondary | ICD-10-CM | POA: Diagnosis not present

## 2016-05-25 ENCOUNTER — Encounter
Admission: RE | Disposition: A | Discharge status: Home / Self Care | Payer: Self-pay | Source: Ambulatory Visit | Attending: Unknown Physician Specialty

## 2016-05-25 ENCOUNTER — Encounter: Payer: Self-pay | Admitting: *Deleted

## 2016-05-25 ENCOUNTER — Ambulatory Visit: Payer: Medicare Other | Admitting: Anesthesiology

## 2016-05-25 ENCOUNTER — Ambulatory Visit
Admission: RE | Admit: 2016-05-25 | Discharge: 2016-05-25 | Disposition: A | Discharge status: Home / Self Care | Payer: Medicare Other | Source: Ambulatory Visit | Attending: Unknown Physician Specialty | Admitting: Unknown Physician Specialty

## 2016-05-25 DIAGNOSIS — M1990 Unspecified osteoarthritis, unspecified site: Secondary | ICD-10-CM | POA: Diagnosis not present

## 2016-05-25 DIAGNOSIS — N189 Chronic kidney disease, unspecified: Secondary | ICD-10-CM | POA: Insufficient documentation

## 2016-05-25 DIAGNOSIS — Z7984 Long term (current) use of oral hypoglycemic drugs: Secondary | ICD-10-CM | POA: Insufficient documentation

## 2016-05-25 DIAGNOSIS — M19011 Primary osteoarthritis, right shoulder: Secondary | ICD-10-CM | POA: Diagnosis not present

## 2016-05-25 DIAGNOSIS — G4730 Sleep apnea, unspecified: Secondary | ICD-10-CM | POA: Diagnosis not present

## 2016-05-25 DIAGNOSIS — R569 Unspecified convulsions: Secondary | ICD-10-CM | POA: Diagnosis not present

## 2016-05-25 DIAGNOSIS — Z91041 Radiographic dye allergy status: Secondary | ICD-10-CM | POA: Diagnosis not present

## 2016-05-25 DIAGNOSIS — M75101 Unspecified rotator cuff tear or rupture of right shoulder, not specified as traumatic: Secondary | ICD-10-CM | POA: Insufficient documentation

## 2016-05-25 DIAGNOSIS — Z87442 Personal history of urinary calculi: Secondary | ICD-10-CM | POA: Insufficient documentation

## 2016-05-25 DIAGNOSIS — J449 Chronic obstructive pulmonary disease, unspecified: Secondary | ICD-10-CM | POA: Diagnosis not present

## 2016-05-25 DIAGNOSIS — J45909 Unspecified asthma, uncomplicated: Secondary | ICD-10-CM | POA: Insufficient documentation

## 2016-05-25 DIAGNOSIS — Z90710 Acquired absence of both cervix and uterus: Secondary | ICD-10-CM | POA: Insufficient documentation

## 2016-05-25 DIAGNOSIS — Z79899 Other long term (current) drug therapy: Secondary | ICD-10-CM | POA: Insufficient documentation

## 2016-05-25 DIAGNOSIS — R0602 Shortness of breath: Secondary | ICD-10-CM | POA: Insufficient documentation

## 2016-05-25 DIAGNOSIS — E1122 Type 2 diabetes mellitus with diabetic chronic kidney disease: Secondary | ICD-10-CM | POA: Insufficient documentation

## 2016-05-25 DIAGNOSIS — Z801 Family history of malignant neoplasm of trachea, bronchus and lung: Secondary | ICD-10-CM | POA: Diagnosis not present

## 2016-05-25 DIAGNOSIS — Z888 Allergy status to other drugs, medicaments and biological substances status: Secondary | ICD-10-CM | POA: Diagnosis not present

## 2016-05-25 DIAGNOSIS — M75121 Complete rotator cuff tear or rupture of right shoulder, not specified as traumatic: Secondary | ICD-10-CM | POA: Diagnosis not present

## 2016-05-25 DIAGNOSIS — M7541 Impingement syndrome of right shoulder: Secondary | ICD-10-CM | POA: Diagnosis not present

## 2016-05-25 DIAGNOSIS — Z8589 Personal history of malignant neoplasm of other organs and systems: Secondary | ICD-10-CM | POA: Diagnosis not present

## 2016-05-25 DIAGNOSIS — Z91048 Other nonmedicinal substance allergy status: Secondary | ICD-10-CM | POA: Insufficient documentation

## 2016-05-25 DIAGNOSIS — Z811 Family history of alcohol abuse and dependence: Secondary | ICD-10-CM | POA: Diagnosis not present

## 2016-05-25 DIAGNOSIS — I129 Hypertensive chronic kidney disease with stage 1 through stage 4 chronic kidney disease, or unspecified chronic kidney disease: Secondary | ICD-10-CM | POA: Diagnosis not present

## 2016-05-25 DIAGNOSIS — E785 Hyperlipidemia, unspecified: Secondary | ICD-10-CM | POA: Diagnosis not present

## 2016-05-25 DIAGNOSIS — Z86010 Personal history of colonic polyps: Secondary | ICD-10-CM | POA: Insufficient documentation

## 2016-05-25 DIAGNOSIS — Z833 Family history of diabetes mellitus: Secondary | ICD-10-CM | POA: Insufficient documentation

## 2016-05-25 DIAGNOSIS — Z96651 Presence of right artificial knee joint: Secondary | ICD-10-CM | POA: Insufficient documentation

## 2016-05-25 DIAGNOSIS — Z5333 Arthroscopic surgical procedure converted to open procedure: Secondary | ICD-10-CM | POA: Diagnosis not present

## 2016-05-25 DIAGNOSIS — Z7982 Long term (current) use of aspirin: Secondary | ICD-10-CM | POA: Diagnosis not present

## 2016-05-25 DIAGNOSIS — Z87891 Personal history of nicotine dependence: Secondary | ICD-10-CM | POA: Diagnosis not present

## 2016-05-25 DIAGNOSIS — Z8249 Family history of ischemic heart disease and other diseases of the circulatory system: Secondary | ICD-10-CM | POA: Insufficient documentation

## 2016-05-25 DIAGNOSIS — M7521 Bicipital tendinitis, right shoulder: Secondary | ICD-10-CM | POA: Diagnosis not present

## 2016-05-25 HISTORY — PX: SUBACROMIAL DECOMPRESSION: SHX5174

## 2016-05-25 HISTORY — PX: SHOULDER ARTHROSCOPY WITH BICEPSTENOTOMY: SHX6204

## 2016-05-25 HISTORY — PX: SHOULDER ARTHROSCOPY WITH DISTAL CLAVICLE RESECTION: SHX5675

## 2016-05-25 HISTORY — PX: SHOULDER ARTHROSCOPY WITH OPEN ROTATOR CUFF REPAIR: SHX6092

## 2016-05-25 LAB — GLUCOSE, CAPILLARY
GLUCOSE-CAPILLARY: 175 mg/dL — AB (ref 65–99)
Glucose-Capillary: 154 mg/dL — ABNORMAL HIGH (ref 65–99)

## 2016-05-25 LAB — POCT I-STAT 4, (NA,K, GLUC, HGB,HCT)
GLUCOSE: 153 mg/dL — AB (ref 65–99)
HCT: 39 % (ref 36.0–46.0)
HEMOGLOBIN: 13.3 g/dL (ref 12.0–15.0)
POTASSIUM: 3.3 mmol/L — AB (ref 3.5–5.1)
SODIUM: 141 mmol/L (ref 135–145)

## 2016-05-25 SURGERY — ARTHROSCOPY, SHOULDER WITH REPAIR, ROTATOR CUFF, OPEN
Anesthesia: General | Site: Shoulder | Laterality: Right | Wound class: Clean

## 2016-05-25 MED ORDER — SUGAMMADEX SODIUM 200 MG/2ML IV SOLN
INTRAVENOUS | Status: DC | PRN
  Administered 2016-05-25: 223.2 mg via INTRAVENOUS

## 2016-05-25 MED ORDER — ROXICET 5-325 MG PO TABS: 1.0000 | ORAL_TABLET | Freq: Four times a day (QID) | ORAL | Status: DC | PRN

## 2016-05-25 MED ORDER — IPRATROPIUM-ALBUTEROL 0.5-2.5 (3) MG/3ML IN SOLN
3.0000 mL | RESPIRATORY_TRACT | Status: DC
  Administered 2016-05-25: 3 mL via RESPIRATORY_TRACT

## 2016-05-25 MED ORDER — SUCCINYLCHOLINE CHLORIDE 20 MG/ML IJ SOLN
INTRAMUSCULAR | Status: DC | PRN
  Administered 2016-05-25: 100 mg via INTRAVENOUS

## 2016-05-25 MED ORDER — FENTANYL CITRATE (PF) 100 MCG/2ML IJ SOLN
INTRAMUSCULAR | Status: DC | PRN
  Administered 2016-05-25: 100 ug via INTRAVENOUS
  Administered 2016-05-25 (×3): 50 ug via INTRAVENOUS

## 2016-05-25 MED ORDER — FENTANYL CITRATE (PF) 100 MCG/2ML IJ SOLN
INTRAMUSCULAR | Status: AC
  Administered 2016-05-25: 50 ug via INTRAVENOUS
  Filled 2016-05-25: qty 2

## 2016-05-25 MED ORDER — EPINEPHRINE HCL 1 MG/ML IJ SOLN
INTRAMUSCULAR | Status: AC
  Filled 2016-05-25: qty 1

## 2016-05-25 MED ORDER — FENTANYL CITRATE (PF) 100 MCG/2ML IJ SOLN
25.0000 ug | INTRAMUSCULAR | Status: DC | PRN
  Administered 2016-05-25 (×2): 50 ug via INTRAVENOUS

## 2016-05-25 MED ORDER — PROPOFOL 10 MG/ML IV BOLUS
INTRAVENOUS | Status: DC | PRN
  Administered 2016-05-25: 150 mg via INTRAVENOUS

## 2016-05-25 MED ORDER — FENTANYL CITRATE (PF) 100 MCG/2ML IJ SOLN
INTRAMUSCULAR | Status: AC
  Filled 2016-05-25: qty 2

## 2016-05-25 MED ORDER — BUPIVACAINE HCL (PF) 0.5 % IJ SOLN
INTRAMUSCULAR | Status: DC | PRN
  Administered 2016-05-25: 30 mL

## 2016-05-25 MED ORDER — CEFAZOLIN IN D5W 1 GM/50ML IV SOLN
INTRAVENOUS | Status: DC | PRN
  Administered 2016-05-25: 2 g via INTRAVENOUS

## 2016-05-25 MED ORDER — BUPIVACAINE HCL (PF) 0.5 % IJ SOLN
INTRAMUSCULAR | Status: AC
Start: 2016-05-25 — End: 2016-05-25
  Filled 2016-05-25: qty 30

## 2016-05-25 MED ORDER — ACETAMINOPHEN 10 MG/ML IV SOLN
INTRAVENOUS | Status: DC | PRN
  Administered 2016-05-25: 1000 mg via INTRAVENOUS

## 2016-05-25 MED ORDER — DEXAMETHASONE SODIUM PHOSPHATE 10 MG/ML IJ SOLN
INTRAMUSCULAR | Status: DC | PRN
  Administered 2016-05-25: 10 mg via INTRAVENOUS

## 2016-05-25 MED ORDER — SODIUM CHLORIDE 0.9 % IV SOLN
INTRAVENOUS | Status: DC
  Administered 2016-05-25: 07:00:00 via INTRAVENOUS

## 2016-05-25 MED ORDER — ONDANSETRON HCL 4 MG/2ML IJ SOLN: 4.0000 mg | Freq: Once | INTRAMUSCULAR | Status: DC | PRN

## 2016-05-25 MED ORDER — LACTATED RINGERS IR SOLN
Status: DC | PRN
  Administered 2016-05-25: 3000 mL

## 2016-05-25 MED ORDER — DEXMEDETOMIDINE HCL 200 MCG/2ML IV SOLN
INTRAVENOUS | Status: DC | PRN
  Administered 2016-05-25: 12 ug via INTRAVENOUS

## 2016-05-25 MED ORDER — ACETAMINOPHEN 10 MG/ML IV SOLN
INTRAVENOUS | Status: AC
  Filled 2016-05-25: qty 100

## 2016-05-25 MED ORDER — EPINEPHRINE HCL 1 MG/ML IJ SOLN
INTRAMUSCULAR | Status: DC | PRN
Start: 2016-05-25 — End: 2016-05-25
  Administered 2016-05-25: 6 mL

## 2016-05-25 MED ORDER — ONDANSETRON HCL 4 MG/2ML IJ SOLN
INTRAMUSCULAR | Status: DC | PRN
Start: 2016-05-25 — End: 2016-05-25
  Administered 2016-05-25: 4 mg via INTRAVENOUS

## 2016-05-25 MED ORDER — LIDOCAINE 2% (20 MG/ML) 5 ML SYRINGE
INTRAMUSCULAR | Status: DC | PRN
  Administered 2016-05-25: 100 mg via INTRAVENOUS

## 2016-05-25 MED ORDER — ROCURONIUM BROMIDE 100 MG/10ML IV SOLN
INTRAVENOUS | Status: DC | PRN
  Administered 2016-05-25 (×2): 10 mg via INTRAVENOUS

## 2016-05-25 MED ORDER — IPRATROPIUM-ALBUTEROL 0.5-2.5 (3) MG/3ML IN SOLN
RESPIRATORY_TRACT | Status: AC
  Administered 2016-05-25: 3 mL via RESPIRATORY_TRACT
  Filled 2016-05-25: qty 3

## 2016-05-25 SURGICAL SUPPLY — 71 items
ADAPTER IRRIG TUBE 2 SPIKE SOL (ADAPTER) ×3 IMPLANT
ANCHOR ALL-SUT Q-FIX 2.8 (Anchor) ×6 IMPLANT
ANCHOR SUT 5.5 SPEEDSCREW (Screw) ×6 IMPLANT
ARTHROWAND PARAGON T2 (SURGICAL WAND)
BLADE ABRADER 4.5 (BLADE) ×3 IMPLANT
BLADE SHAVER 4.5X7 STR FR (MISCELLANEOUS) ×3 IMPLANT
BLADE SURG 15 STRL LF DISP TIS (BLADE) IMPLANT
BLADE SURG 15 STRL SS (BLADE)
BUR ABRADER 5.5 BLK (MISCELLANEOUS) IMPLANT
BUR BR 5.5 WIDE MOUTH (BURR) ×3 IMPLANT
BURR ABRADER 5.5 BLK (MISCELLANEOUS)
CANNULA 8.5X75 THRED (CANNULA) IMPLANT
CANNULA SHAVER 8MMX76MM (CANNULA) IMPLANT
CAP LOCK ULTRA CANNULA (MISCELLANEOUS) IMPLANT
CHLORAPREP W/TINT 26ML (MISCELLANEOUS) ×6 IMPLANT
DRAPE STERI 35X30 U-POUCH (DRAPES) ×3 IMPLANT
ELECT REM PT RETURN 9FT ADLT (ELECTROSURGICAL) ×3
ELECTRODE REM PT RTRN 9FT ADLT (ELECTROSURGICAL) ×1 IMPLANT
GAUZE SPONGE 4X4 12PLY STRL (GAUZE/BANDAGES/DRESSINGS) ×3 IMPLANT
GLOVE BIO SURGEON STRL SZ7.5 (GLOVE) ×3 IMPLANT
GLOVE BIO SURGEON STRL SZ8 (GLOVE) ×3 IMPLANT
GLOVE INDICATOR 8.0 STRL GRN (GLOVE) ×3 IMPLANT
GOWN STRL REUS W/ TWL LRG LVL3 (GOWN DISPOSABLE) ×1 IMPLANT
GOWN STRL REUS W/TWL LRG LVL3 (GOWN DISPOSABLE) ×2
GOWN STRL REUS W/TWL LRG LVL4 (GOWN DISPOSABLE) ×3 IMPLANT
IV LACTATED RINGER IRRG 3000ML (IV SOLUTION) ×8
IV LR IRRIG 3000ML ARTHROMATIC (IV SOLUTION) ×4 IMPLANT
KIT SHOULDER TRACTION (DRAPES) ×3 IMPLANT
KIT SUTURE 2.8 Q-FIX DISP (MISCELLANEOUS) ×3 IMPLANT
MANIFOLD 4PT FOR NEPTUNE1 (MISCELLANEOUS) ×3 IMPLANT
NEEDLE 18GX1X1/2 (RX/OR ONLY) (NEEDLE) IMPLANT
NEEDLE MAYO CATGUT SZ 1.5 (NEEDLE) ×3
NEEDLE MAYO CATGUT SZ 2 (NEEDLE) ×1 IMPLANT
NEEDLE SPNL 18GX3.5 QUINCKE PK (NEEDLE) IMPLANT
PACK ARTHROSCOPY SHOULDER (MISCELLANEOUS) ×3 IMPLANT
PASSER SUT CAPTURE FIRST (SUTURE) ×3 IMPLANT
SET TUBE SUCT SHAVER OUTFL 24K (TUBING) ×3 IMPLANT
SOL PREP PVP 2OZ (MISCELLANEOUS) ×3
SOLUTION PREP PVP 2OZ (MISCELLANEOUS) ×1 IMPLANT
STAPLER SKIN PROX 35W (STAPLE) IMPLANT
SUT ETHIBOND NAB CT1 #1 30IN (SUTURE) IMPLANT
SUT ETHILON 3-0 FS-10 30 BLK (SUTURE) ×3
SUT PDS AB 1 CT1 27 (SUTURE) IMPLANT
SUT PERFECTPASSER WHITE CART (SUTURE) IMPLANT
SUT PROLENE 2 0 CT2 30 (SUTURE) IMPLANT
SUT SMART STITCH CARTRIDGE (SUTURE) IMPLANT
SUT TICRON 2-0 30IN 311381 (SUTURE) IMPLANT
SUT VIC AB 0 CT1 36 (SUTURE) ×3 IMPLANT
SUT VIC AB 0 CT2 27 (SUTURE) ×3 IMPLANT
SUT VIC AB 2-0 CT1 27 (SUTURE)
SUT VIC AB 2-0 CT1 TAPERPNT 27 (SUTURE) IMPLANT
SUT VIC AB 2-0 CT2 27 (SUTURE) IMPLANT
SUT VIC AB 2-0 SH 27 (SUTURE)
SUT VIC AB 2-0 SH 27XBRD (SUTURE) IMPLANT
SUT VIC AB 3-0 SH 27 (SUTURE)
SUT VIC AB 3-0 SH 27X BRD (SUTURE) IMPLANT
SUTURE EHLN 3-0 FS-10 30 BLK (SUTURE) ×1 IMPLANT
SUTURE MAGNUM WIRE 2X48 BLK (SUTURE) IMPLANT
SYRINGE 10CC LL (SYRINGE) IMPLANT
TAPE MICROFOAM 4IN (TAPE) ×3 IMPLANT
TUBING ARTHRO INFLOW-ONLY STRL (TUBING) ×3 IMPLANT
WAND 30 DEG SABER W/CORD (SURGICAL WAND) IMPLANT
WAND ARTHRO PARAGON T2 (SURGICAL WAND) IMPLANT
WAND COVAC 50 IFS (MISCELLANEOUS) IMPLANT
WAND COVATOR 20 (MISCELLANEOUS) IMPLANT
WAND HAND CNTRL MULTIVAC 50 (MISCELLANEOUS) IMPLANT
WAND HAND CNTRL MULTIVAC 90 (MISCELLANEOUS) ×3 IMPLANT
WAND TENDON TOPAZ 0 ANGL (MISCELLANEOUS) IMPLANT
WAND TOPAZ EPF  WAS Q (MISCELLANEOUS)
WAND TOPAZ EPF WAS Q (MISCELLANEOUS) IMPLANT
WIRE MAGNUM (SUTURE) IMPLANT

## 2016-05-25 NOTE — Anesthesia Preprocedure Evaluation (Signed)
Anesthesia Evaluation  Patient identified by MRN, date of birth, ID band Patient awake    Reviewed: Allergy & Precautions, H&P , NPO status , Patient's Chart, lab work & pertinent test results, reviewed documented beta blocker date and time   Airway Mallampati: II  TM Distance: >3 FB Neck ROM: full    Dental no notable dental hx. (+) Edentulous Upper, Edentulous Lower, Upper Dentures, Lower Dentures   Pulmonary shortness of breath and with exertion, asthma , sleep apnea and Continuous Positive Airway Pressure Ventilation , COPD,  COPD inhaler, Recent URI , Residual Cough, former smoker,    Pulmonary exam normal breath sounds clear to auscultation       Cardiovascular Exercise Tolerance: Good hypertension, On Medications (-) angina(-) CAD, (-) Past MI, (-) Cardiac Stents and (-) CABG Normal cardiovascular exam(-) dysrhythmias (-) Valvular Problems/Murmurs Rhythm:regular Rate:Normal     Neuro/Psych Seizures -, Well Controlled,  negative psych ROS   GI/Hepatic negative GI ROS, Neg liver ROS,   Endo/Other  diabetes  Renal/GU CRFRenal disease  negative genitourinary   Musculoskeletal   Abdominal   Peds  Hematology negative hematology ROS (+)   Anesthesia Other Findings Past Medical History:   Cancer ([place])                                                   Comment:fallopian tubes- radiation   Fracture closed, humerus, shaft                                Comment:right    Hypertension                                                 Hyperlipidemia                                  04/30/14      Colon polyps                                    05/21/14       Chronic kidney disease                                         Comment:chronic renal insufficiency   Seizures ([facility])                                               Sleep apnea                                                  COPD (chronic obstructive pulmonary disease) [facility]  Diabetes mellitus without complication ([place])                 Arthritis                                                    Impingement syndrome of right shoulder          12/29/15      Rotator cuff tear                               04/21/16         Comment:right   Pneumonia                                                      Comment:hx   Reproductive/Obstetrics negative OB ROS                             Anesthesia Physical Anesthesia Plan  ASA: III  Anesthesia Plan: General   Post-op Pain Management:    Induction:   Airway Management Planned:   Additional Equipment:   Intra-op Plan:   Post-operative Plan:   Informed Consent: I have reviewed the patients History and Physical, chart, labs and discussed the procedure including the risks, benefits and alternatives for the proposed anesthesia with the patient or authorized representative who has indicated his/her understanding and acceptance.   Dental Advisory Given  Plan Discussed with: Anesthesiologist, CRNA and Surgeon  Anesthesia Plan Comments:         Anesthesia Quick Evaluation

## 2016-05-25 NOTE — Op Note (Signed)
05/25/2016  11:08 AM  Patient:   Angelica Juarez  Pre-Op Diagnosis:   RIGHT ROTATOR CUFF TEAR PLUS IMPINGEMENT SYNDROME RIGHT SHOULDER  Postoperative diagnosis:Same plus long head of the biceps tendinosis  Procedure: Arthroscopic release of the long head of biceps tendon plus arthroscopic subacromial decompression followed by mini incision cuff repair  Anesthesia: General endotracheal   Findings: As above.   Complications: None  Estimated blood loss: negligible  Tourniquet time: None  Drains: None   Brief clinical note:  The patient's symptoms have progressed despite medications, activity modification, etc. The patient's history and examination are consistent with impingement plus rotator cuff tear. These findings were confirmed by MRI scan. The patient presents at this time for definitive management of these shoulder symptoms.  Procedure: The patient was  brought into the operating room and placed in the supine position. The patient then underwent  endotracheal intubation and general anesthesia before being repositioned in the lateral decubitus position. The right shoulder was prepped and draped in usual fashion for shoulder procedure. The shoulder was supported with the Acufex shoulder suspension device.  10 pounds of traction was utilized. 2 g of Kefzol was given IV prior to the start of the procedure . A timeout was performed . A posterior portal was created and the glenohumeral joint thoroughly inspected revealing some grade 2 and grade 3 chondral changes in the humeral head and glenoid. The long head of the biceps tendon attachment was frayed. The labrum was intact. There appeared to be an intra-articular partial cuff tear. An anterior portal was created. An ArthroCare wand was inserted and used to obtain hemostasis as well as to perform a limited synovectomy.The biceps tendon was evaluated and then released from its labral attachment using an ArthroCare wand.  The  scope was repositioned through the posterior portal into the subacromial space. A separate lateral portal was created using an outside-in technique. The rotator cuff was inspected revealing a small, almost complete,  tear in the supraspinatus attachment to the greater tuberosity. An ArthroCare 90 wand followed by a 4.0 full-radius resector was introduced and used to perform a subtotal bursectomy. The ArthroCare wand was then inserted and used to remove the periosteal tissue off the undersurface of the anterior third of the acromion as well as to recess the coracoacromial ligament from its attachment along the anterior and lateral margins of the acromion.   With the scope in the lateral portal a 5.48mm acromionizing bur was introduced through the posterior portal and used to perform the decompression by removing the undersurface of the anterior third of the acromion. I also used the same burr to lightly decorticate the greater tuberosity in the intended area of the rotator rotator cuff reattachment. I also used a small punch to make vascular vent holes in the greater tuberosity. The instruments were then removed from the subacromial space.  An approximately 4 cm incision was made over the midlateral aspect of the shoulder.   This incision was carried down through the subcutaneous tissues onto the deltoid. The deltoid was divided in line with its fibers to provide access into the subacromial space. The rotator cuff tear was readily identified. The margins were debrided.   The tear was repaired using horizontal mattress sutures secured to two Q- Fix anchors. The horizontal mattress suture tails were then crisscrossed over to 2 laterally placed 5.5 speed screws. This gave me a 2 row repair of the torn rotator cuff. The repaired cuff seemed to be  quite stable. I  did place an additional simple suture in the most posterior aspect of the cuff tear.   The wound was copiously irrigated with sterile saline solution  before the deltoid was repaired to bone with #1 ethibond suture.  Deltoid interval was closed with 0 vicryl. The subcutaneous tissues were closed  using 2-0 Vicryl interrupted sutures and the skin incision was closed using staples. The portal sites also were closed using 3-O nylon sutures. The puncture wounds and incision were injected with a total of 20 cc of half percent Marcaine without epinephrine. A sterile bulky dressing was applied to the shoulder plus 4 TENS pads before the right upper extremity was placed into a shoulder immobilizer. The patient was then awakened, extubated, and returned to the recovery room in satisfactory condition after tolerating the procedure well.  Blood loss was negligible.

## 2016-05-25 NOTE — Anesthesia Procedure Notes (Signed)
Procedure Name: Intubation Date/Time: 05/25/2016 7:43 AM Performed by: Marsh Dolly Pre-anesthesia Checklist: Patient identified, Patient being monitored, Timeout performed, Emergency Drugs available and Suction available Patient Re-evaluated:Patient Re-evaluated prior to inductionOxygen Delivery Method: Circle system utilized Preoxygenation: Pre-oxygenation with 100% oxygen Intubation Type: IV induction Ventilation: Mask ventilation without difficulty Laryngoscope Size: 3 and Miller Grade View: Grade I Tube type: Oral Tube size: 7.5 mm Number of attempts: 1 Airway Equipment and Method: Stylet Placement Confirmation: ETT inserted through vocal cords under direct vision,  positive ETCO2 and breath sounds checked- equal and bilateral Secured at: 21 cm Tube secured with: Tape Dental Injury: Teeth and Oropharynx as per pre-operative assessment

## 2016-05-25 NOTE — H&P (Signed)
  H and P reviewed. No changes. Uploaded at later date. 

## 2016-05-25 NOTE — Transfer of Care (Signed)
Immediate Anesthesia Transfer of Care Note  Patient: Angelica Juarez  Procedure(s) Performed: Procedure(s): SHOULDER ARTHROSCOPY WITH OPEN ROTATOR CUFF REPAIR (Right) SUBACROMIAL DECOMPRESSION (Right)  Patient Location: PACU  Anesthesia Type:General  Level of Consciousness: awake, alert  and oriented  Airway & Oxygen Therapy: Patient Spontanous Breathing and Patient connected to face mask oxygen  Post-op Assessment: Report given to RN and Post -op Vital signs reviewed and stable  Post vital signs: Reviewed and stable  Last Vitals:  Filed Vitals:   05/25/16 0617  BP: 162/59  Pulse: 79  Temp: 36.7 C  Resp: 18    Last Pain:  Filed Vitals:   05/25/16 0617  PainSc: 0-No pain         Complications: No apparent anesthesia complications

## 2016-05-25 NOTE — Discharge Instructions (Signed)
Use shoulder immobilizer at all times ° °Keep dressing dry ° °Leave dressing in place until first postoperative visit ° °Return to the clinic about 1 week post surgery ° °Take 81 mg aspirin or Bufferin tablet twice a day for 2 weeks post surgery ° °Can sleep with multiple pillows behind the back or in a recliner ° °Use TENS unit if prescribed ° °Take pain medication prior to going to sleep the evening of your surgery ° °Ice pack prn ° °Activity as tolerated ° ° ° ° °AMBULATORY SURGERY  °DISCHARGE INSTRUCTIONS ° ° °1) The drugs that you were given will stay in your system until tomorrow so for the next 24 hours you should not: ° °A) Drive an automobile °B) Make any legal decisions °C) Drink any alcoholic beverage ° ° °2) You may resume regular meals tomorrow.  Today it is better to start with liquids and gradually work up to solid foods. ° °You may eat anything you prefer, but it is better to start with liquids, then soup and crackers, and gradually work up to solid foods. ° ° °3) Please notify your doctor immediately if you have any unusual bleeding, trouble breathing, redness and pain at the surgery site, drainage, fever, or pain not relieved by medication. ° ° ° °4) Additional Instructions: ° ° ° ° ° ° ° °Please contact your physician with any problems or Same Day Surgery at [phone], Monday through Friday 6 am to 4 pm, or [place] [facility] number at [phone]. °

## 2016-05-25 NOTE — Anesthesia Postprocedure Evaluation (Signed)
Anesthesia Post Note  Patient: Angelica Juarez  Procedure(s) Performed: Procedure(s) (LRB): SHOULDER ARTHROSCOPY WITH OPEN ROTATOR CUFF REPAIR (Right) SUBACROMIAL DECOMPRESSION (Right) SHOULDER ARTHROSCOPY WITH BICEPSTENOTOMY (Right) SHOULDER ARTHROSCOPY WITH DISTAL CLAVICLE RESECTION (Right)  Patient location during evaluation: PACU Anesthesia Type: General Level of consciousness: awake and alert Pain management: pain level controlled Vital Signs Assessment: post-procedure vital signs reviewed and stable Respiratory status: spontaneous breathing, nonlabored ventilation, respiratory function stable and patient connected to nasal cannula oxygen Cardiovascular status: blood pressure returned to baseline and stable Postop Assessment: no signs of nausea or vomiting Anesthetic complications: no    Last Vitals:  Filed Vitals:   05/25/16 1132 05/25/16 1158  BP: 149/70 140/69  Pulse: 102   Temp: 35.9 C 36 C  Resp: 18 18    Last Pain:  Filed Vitals:   05/25/16 1205  PainSc: 4                  Martha Clan

## 2016-06-07 DIAGNOSIS — J301 Allergic rhinitis due to pollen: Secondary | ICD-10-CM | POA: Diagnosis not present

## 2016-06-23 DIAGNOSIS — J301 Allergic rhinitis due to pollen: Secondary | ICD-10-CM | POA: Diagnosis not present

## 2016-06-28 DIAGNOSIS — J449 Chronic obstructive pulmonary disease, unspecified: Secondary | ICD-10-CM | POA: Diagnosis not present

## 2016-06-28 DIAGNOSIS — J301 Allergic rhinitis due to pollen: Secondary | ICD-10-CM | POA: Diagnosis not present

## 2016-06-28 DIAGNOSIS — G4733 Obstructive sleep apnea (adult) (pediatric): Secondary | ICD-10-CM | POA: Diagnosis not present

## 2016-06-29 DIAGNOSIS — M25511 Pain in right shoulder: Secondary | ICD-10-CM | POA: Diagnosis not present

## 2016-07-01 DIAGNOSIS — M25511 Pain in right shoulder: Secondary | ICD-10-CM | POA: Diagnosis not present

## 2016-07-05 DIAGNOSIS — M25511 Pain in right shoulder: Secondary | ICD-10-CM | POA: Diagnosis not present

## 2016-07-06 DIAGNOSIS — G4733 Obstructive sleep apnea (adult) (pediatric): Secondary | ICD-10-CM | POA: Diagnosis not present

## 2016-07-07 DIAGNOSIS — M25511 Pain in right shoulder: Secondary | ICD-10-CM | POA: Diagnosis not present

## 2016-07-11 DIAGNOSIS — I890 Lymphedema, not elsewhere classified: Secondary | ICD-10-CM | POA: Diagnosis not present

## 2016-07-11 DIAGNOSIS — I872 Venous insufficiency (chronic) (peripheral): Secondary | ICD-10-CM | POA: Diagnosis not present

## 2016-07-11 DIAGNOSIS — N183 Chronic kidney disease, stage 3 (moderate): Secondary | ICD-10-CM | POA: Diagnosis not present

## 2016-07-11 DIAGNOSIS — I129 Hypertensive chronic kidney disease with stage 1 through stage 4 chronic kidney disease, or unspecified chronic kidney disease: Secondary | ICD-10-CM | POA: Diagnosis not present

## 2016-07-11 DIAGNOSIS — M7989 Other specified soft tissue disorders: Secondary | ICD-10-CM | POA: Diagnosis not present

## 2016-07-12 DIAGNOSIS — M25511 Pain in right shoulder: Secondary | ICD-10-CM | POA: Diagnosis not present

## 2016-07-14 DIAGNOSIS — G479 Sleep disorder, unspecified: Secondary | ICD-10-CM | POA: Diagnosis not present

## 2016-07-14 DIAGNOSIS — R569 Unspecified convulsions: Secondary | ICD-10-CM | POA: Diagnosis not present

## 2016-07-14 DIAGNOSIS — M25511 Pain in right shoulder: Secondary | ICD-10-CM | POA: Diagnosis not present

## 2016-07-14 DIAGNOSIS — I10 Essential (primary) hypertension: Secondary | ICD-10-CM | POA: Diagnosis not present

## 2016-07-19 DIAGNOSIS — M25511 Pain in right shoulder: Secondary | ICD-10-CM | POA: Diagnosis not present

## 2016-07-20 DIAGNOSIS — J301 Allergic rhinitis due to pollen: Secondary | ICD-10-CM | POA: Diagnosis not present

## 2016-07-20 DIAGNOSIS — R0602 Shortness of breath: Secondary | ICD-10-CM | POA: Diagnosis not present

## 2016-07-26 DIAGNOSIS — M25511 Pain in right shoulder: Secondary | ICD-10-CM | POA: Diagnosis not present

## 2016-07-28 DIAGNOSIS — M25511 Pain in right shoulder: Secondary | ICD-10-CM | POA: Diagnosis not present

## 2016-07-29 DIAGNOSIS — J301 Allergic rhinitis due to pollen: Secondary | ICD-10-CM | POA: Diagnosis not present

## 2016-08-02 DIAGNOSIS — M25511 Pain in right shoulder: Secondary | ICD-10-CM | POA: Diagnosis not present

## 2016-08-03 DIAGNOSIS — J301 Allergic rhinitis due to pollen: Secondary | ICD-10-CM | POA: Diagnosis not present

## 2016-08-06 ENCOUNTER — Ambulatory Visit: Payer: Medicare Other

## 2016-08-06 ENCOUNTER — Ambulatory Visit (INDEPENDENT_AMBULATORY_CARE_PROVIDER_SITE_OTHER)
Admission: EM | Admit: 2016-08-06 | Discharge: 2016-08-06 | Disposition: A | Discharge status: Other Acute Inpatient Hospital | Payer: Medicare Other | Source: Home / Self Care | Attending: Family Medicine | Admitting: Family Medicine

## 2016-08-06 ENCOUNTER — Encounter: Payer: Self-pay | Admitting: Emergency Medicine

## 2016-08-06 ENCOUNTER — Inpatient Hospital Stay
Admission: EM | Admit: 2016-08-06 | Discharge: 2016-08-08 | DRG: 291 | Disposition: A | Discharge status: Home / Self Care | Payer: Medicare Other | Attending: Internal Medicine | Admitting: Internal Medicine

## 2016-08-06 DIAGNOSIS — I130 Hypertensive heart and chronic kidney disease with heart failure and stage 1 through stage 4 chronic kidney disease, or unspecified chronic kidney disease: Principal | ICD-10-CM | POA: Diagnosis present

## 2016-08-06 DIAGNOSIS — R05 Cough: Secondary | ICD-10-CM

## 2016-08-06 DIAGNOSIS — M1990 Unspecified osteoarthritis, unspecified site: Secondary | ICD-10-CM | POA: Diagnosis present

## 2016-08-06 DIAGNOSIS — Z23 Encounter for immunization: Secondary | ICD-10-CM | POA: Diagnosis not present

## 2016-08-06 DIAGNOSIS — Z79899 Other long term (current) drug therapy: Secondary | ICD-10-CM

## 2016-08-06 DIAGNOSIS — Z91040 Latex allergy status: Secondary | ICD-10-CM

## 2016-08-06 DIAGNOSIS — Z87891 Personal history of nicotine dependence: Secondary | ICD-10-CM

## 2016-08-06 DIAGNOSIS — N182 Chronic kidney disease, stage 2 (mild): Secondary | ICD-10-CM | POA: Diagnosis present

## 2016-08-06 DIAGNOSIS — D72829 Elevated white blood cell count, unspecified: Secondary | ICD-10-CM | POA: Diagnosis present

## 2016-08-06 DIAGNOSIS — J9601 Acute respiratory failure with hypoxia: Secondary | ICD-10-CM | POA: Diagnosis present

## 2016-08-06 DIAGNOSIS — Z8249 Family history of ischemic heart disease and other diseases of the circulatory system: Secondary | ICD-10-CM

## 2016-08-06 DIAGNOSIS — R062 Wheezing: Secondary | ICD-10-CM

## 2016-08-06 DIAGNOSIS — E0965 Drug or chemical induced diabetes mellitus with hyperglycemia: Secondary | ICD-10-CM | POA: Diagnosis present

## 2016-08-06 DIAGNOSIS — J810 Acute pulmonary edema: Secondary | ICD-10-CM | POA: Diagnosis not present

## 2016-08-06 DIAGNOSIS — G40909 Epilepsy, unspecified, not intractable, without status epilepticus: Secondary | ICD-10-CM | POA: Diagnosis present

## 2016-08-06 DIAGNOSIS — Z7982 Long term (current) use of aspirin: Secondary | ICD-10-CM

## 2016-08-06 DIAGNOSIS — Z86010 Personal history of colonic polyps: Secondary | ICD-10-CM | POA: Diagnosis not present

## 2016-08-06 DIAGNOSIS — Z803 Family history of malignant neoplasm of breast: Secondary | ICD-10-CM

## 2016-08-06 DIAGNOSIS — Z7984 Long term (current) use of oral hypoglycemic drugs: Secondary | ICD-10-CM | POA: Insufficient documentation

## 2016-08-06 DIAGNOSIS — J449 Chronic obstructive pulmonary disease, unspecified: Secondary | ICD-10-CM

## 2016-08-06 DIAGNOSIS — Y92239 Unspecified place in hospital as the place of occurrence of the external cause: Secondary | ICD-10-CM | POA: Diagnosis present

## 2016-08-06 DIAGNOSIS — N189 Chronic kidney disease, unspecified: Secondary | ICD-10-CM | POA: Insufficient documentation

## 2016-08-06 DIAGNOSIS — Z96653 Presence of artificial knee joint, bilateral: Secondary | ICD-10-CM | POA: Diagnosis present

## 2016-08-06 DIAGNOSIS — I110 Hypertensive heart disease with heart failure: Secondary | ICD-10-CM | POA: Diagnosis not present

## 2016-08-06 DIAGNOSIS — E119 Type 2 diabetes mellitus without complications: Secondary | ICD-10-CM | POA: Diagnosis not present

## 2016-08-06 DIAGNOSIS — Z833 Family history of diabetes mellitus: Secondary | ICD-10-CM

## 2016-08-06 DIAGNOSIS — E1122 Type 2 diabetes mellitus with diabetic chronic kidney disease: Secondary | ICD-10-CM

## 2016-08-06 DIAGNOSIS — J441 Chronic obstructive pulmonary disease with (acute) exacerbation: Secondary | ICD-10-CM | POA: Diagnosis present

## 2016-08-06 DIAGNOSIS — Z8544 Personal history of malignant neoplasm of other female genital organs: Secondary | ICD-10-CM

## 2016-08-06 DIAGNOSIS — J9602 Acute respiratory failure with hypercapnia: Secondary | ICD-10-CM | POA: Diagnosis present

## 2016-08-06 DIAGNOSIS — I509 Heart failure, unspecified: Secondary | ICD-10-CM | POA: Insufficient documentation

## 2016-08-06 DIAGNOSIS — E785 Hyperlipidemia, unspecified: Secondary | ICD-10-CM | POA: Diagnosis present

## 2016-08-06 DIAGNOSIS — R0602 Shortness of breath: Secondary | ICD-10-CM

## 2016-08-06 DIAGNOSIS — G4733 Obstructive sleep apnea (adult) (pediatric): Secondary | ICD-10-CM | POA: Diagnosis present

## 2016-08-06 DIAGNOSIS — T380X5A Adverse effect of glucocorticoids and synthetic analogues, initial encounter: Secondary | ICD-10-CM | POA: Diagnosis present

## 2016-08-06 DIAGNOSIS — I10 Essential (primary) hypertension: Secondary | ICD-10-CM | POA: Diagnosis not present

## 2016-08-06 DIAGNOSIS — I5033 Acute on chronic diastolic (congestive) heart failure: Secondary | ICD-10-CM | POA: Diagnosis present

## 2016-08-06 DIAGNOSIS — Z91041 Radiographic dye allergy status: Secondary | ICD-10-CM

## 2016-08-06 DIAGNOSIS — Z888 Allergy status to other drugs, medicaments and biological substances status: Secondary | ICD-10-CM | POA: Diagnosis not present

## 2016-08-06 DIAGNOSIS — I5031 Acute diastolic (congestive) heart failure: Secondary | ICD-10-CM | POA: Diagnosis not present

## 2016-08-06 LAB — CBC WITH DIFFERENTIAL/PLATELET
BASOS ABS: 0 10*3/uL (ref 0–0.1)
Basophils Relative: 0 %
EOS ABS: 0.2 10*3/uL (ref 0–0.7)
EOS PCT: 1 %
HCT: 40.7 % (ref 35.0–47.0)
Hemoglobin: 14.2 g/dL (ref 12.0–16.0)
LYMPHS PCT: 2 %
Lymphs Abs: 0.3 10*3/uL — ABNORMAL LOW (ref 1.0–3.6)
MCH: 30.3 pg (ref 26.0–34.0)
MCHC: 34.8 g/dL (ref 32.0–36.0)
MCV: 87.2 fL (ref 80.0–100.0)
MONO ABS: 0.7 10*3/uL (ref 0.2–0.9)
Monocytes Relative: 5 %
Neutro Abs: 13.7 10*3/uL — ABNORMAL HIGH (ref 1.4–6.5)
Neutrophils Relative %: 92 %
PLATELETS: 378 10*3/uL (ref 150–440)
RBC: 4.67 MIL/uL (ref 3.80–5.20)
RDW: 14 % (ref 11.5–14.5)
WBC: 15 10*3/uL — AB (ref 3.6–11.0)

## 2016-08-06 LAB — COMPREHENSIVE METABOLIC PANEL
ALK PHOS: 86 U/L (ref 38–126)
ALT: 41 U/L (ref 14–54)
ANION GAP: 9 (ref 5–15)
AST: 30 U/L (ref 15–41)
Albumin: 3.8 g/dL (ref 3.5–5.0)
BILIRUBIN TOTAL: 1.2 mg/dL (ref 0.3–1.2)
BUN: 10 mg/dL (ref 6–20)
CALCIUM: 8.9 mg/dL (ref 8.9–10.3)
CO2: 26 mmol/L (ref 22–32)
Chloride: 99 mmol/L — ABNORMAL LOW (ref 101–111)
Creatinine, Ser: 0.74 mg/dL (ref 0.44–1.00)
Glucose, Bld: 155 mg/dL — ABNORMAL HIGH (ref 65–99)
POTASSIUM: 3.5 mmol/L (ref 3.5–5.1)
Sodium: 134 mmol/L — ABNORMAL LOW (ref 135–145)
TOTAL PROTEIN: 6.9 g/dL (ref 6.5–8.1)

## 2016-08-06 LAB — URINALYSIS COMPLETE WITH MICROSCOPIC (ARMC ONLY)
BILIRUBIN URINE: NEGATIVE
Bacteria, UA: NONE SEEN
GLUCOSE, UA: NEGATIVE mg/dL
HGB URINE DIPSTICK: NEGATIVE
KETONES UR: NEGATIVE mg/dL
LEUKOCYTES UA: NEGATIVE
Nitrite: NEGATIVE
Protein, ur: >500 mg/dL — AB
RBC / HPF: NONE SEEN RBC/hpf (ref 0–5)
Specific Gravity, Urine: 1.01 (ref 1.005–1.030)
pH: 5 (ref 5.0–8.0)

## 2016-08-06 LAB — BRAIN NATRIURETIC PEPTIDE: B NATRIURETIC PEPTIDE 5: 406 pg/mL — AB (ref 0.0–100.0)

## 2016-08-06 LAB — GLUCOSE, CAPILLARY: GLUCOSE-CAPILLARY: 234 mg/dL — AB (ref 65–99)

## 2016-08-06 LAB — TROPONIN I

## 2016-08-06 LAB — LACTIC ACID, PLASMA: LACTIC ACID, VENOUS: 1.6 mmol/L (ref 0.5–1.9)

## 2016-08-06 MED ORDER — BUDESONIDE 0.5 MG/2ML IN SUSP
0.5000 mg | Freq: Two times a day (BID) | RESPIRATORY_TRACT | Status: DC
  Administered 2016-08-06 – 2016-08-08 (×4): 0.5 mg via RESPIRATORY_TRACT
  Filled 2016-08-06 (×4): qty 2

## 2016-08-06 MED ORDER — ACETAMINOPHEN 325 MG PO TABS: 650.0000 mg | ORAL_TABLET | Freq: Four times a day (QID) | ORAL | Status: DC | PRN

## 2016-08-06 MED ORDER — IPRATROPIUM-ALBUTEROL 0.5-2.5 (3) MG/3ML IN SOLN
3.0000 mL | Freq: Once | RESPIRATORY_TRACT | Status: DC
  Filled 2016-08-06: qty 3

## 2016-08-06 MED ORDER — MONTELUKAST SODIUM 10 MG PO TABS
10.0000 mg | ORAL_TABLET | Freq: Every day | ORAL | Status: DC
  Administered 2016-08-06 – 2016-08-07 (×2): 10 mg via ORAL
  Filled 2016-08-06 (×2): qty 1

## 2016-08-06 MED ORDER — IPRATROPIUM-ALBUTEROL 0.5-2.5 (3) MG/3ML IN SOLN
3.0000 mL | Freq: Once | RESPIRATORY_TRACT | Status: AC
  Administered 2016-08-06: 3 mL via RESPIRATORY_TRACT

## 2016-08-06 MED ORDER — IPRATROPIUM-ALBUTEROL 0.5-2.5 (3) MG/3ML IN SOLN
3.0000 mL | Freq: Four times a day (QID) | RESPIRATORY_TRACT | Status: DC
  Administered 2016-08-06 – 2016-08-08 (×7): 3 mL via RESPIRATORY_TRACT
  Filled 2016-08-06 (×7): qty 3

## 2016-08-06 MED ORDER — NIFEDIPINE ER 30 MG PO TB24
30.0000 mg | ORAL_TABLET | Freq: Every day | ORAL | Status: DC
  Administered 2016-08-06 – 2016-08-07 (×2): 30 mg via ORAL
  Filled 2016-08-06: qty 1

## 2016-08-06 MED ORDER — B COMPLEX-C PO TABS
1.0000 | ORAL_TABLET | Freq: Every day | ORAL | Status: DC
  Administered 2016-08-07 – 2016-08-08 (×2): 1 via ORAL
  Filled 2016-08-06 (×3): qty 1

## 2016-08-06 MED ORDER — ONDANSETRON HCL 4 MG PO TABS: 4.0000 mg | ORAL_TABLET | Freq: Four times a day (QID) | ORAL | Status: DC | PRN

## 2016-08-06 MED ORDER — IRBESARTAN 150 MG PO TABS
300.0000 mg | ORAL_TABLET | Freq: Every day | ORAL | Status: DC
  Administered 2016-08-07 – 2016-08-08 (×2): 300 mg via ORAL
  Filled 2016-08-06: qty 1
  Filled 2016-08-06 (×2): qty 2

## 2016-08-06 MED ORDER — HYDRALAZINE HCL 50 MG PO TABS
100.0000 mg | ORAL_TABLET | Freq: Three times a day (TID) | ORAL | Status: DC
  Administered 2016-08-06 – 2016-08-08 (×6): 100 mg via ORAL
  Filled 2016-08-06 (×6): qty 2

## 2016-08-06 MED ORDER — ATENOLOL 100 MG PO TABS
100.0000 mg | ORAL_TABLET | Freq: Every day | ORAL | Status: DC
  Administered 2016-08-06 – 2016-08-08 (×3): 100 mg via ORAL
  Filled 2016-08-06 (×3): qty 1

## 2016-08-06 MED ORDER — INFLUENZA VAC SPLIT QUAD 0.5 ML IM SUSY
0.5000 mL | PREFILLED_SYRINGE | INTRAMUSCULAR | Status: AC
  Administered 2016-08-07: 0.5 mL via INTRAMUSCULAR
  Filled 2016-08-06: qty 0.5

## 2016-08-06 MED ORDER — TRAMADOL HCL 50 MG PO TABS: 50.0000 mg | ORAL_TABLET | Freq: Four times a day (QID) | ORAL | Status: DC | PRN

## 2016-08-06 MED ORDER — CLONIDINE HCL 0.1 MG PO TABS
0.2000 mg | ORAL_TABLET | Freq: Two times a day (BID) | ORAL | Status: DC
  Administered 2016-08-06 – 2016-08-08 (×4): 0.2 mg via ORAL
  Filled 2016-08-06 (×4): qty 2

## 2016-08-06 MED ORDER — IPRATROPIUM-ALBUTEROL 0.5-2.5 (3) MG/3ML IN SOLN
RESPIRATORY_TRACT | Status: AC
  Administered 2016-08-06: 3 mL via RESPIRATORY_TRACT
  Filled 2016-08-06: qty 6

## 2016-08-06 MED ORDER — ACETAMINOPHEN 650 MG RE SUPP
650.0000 mg | Freq: Four times a day (QID) | RECTAL | Status: DC | PRN
Start: 2016-08-06 — End: 2016-08-08

## 2016-08-06 MED ORDER — FUROSEMIDE 10 MG/ML IJ SOLN
20.0000 mg | Freq: Once | INTRAMUSCULAR | Status: AC
Start: 2016-08-06 — End: 2016-08-06
  Administered 2016-08-06: 20 mg via INTRAVENOUS
  Filled 2016-08-06: qty 4

## 2016-08-06 MED ORDER — ASPIRIN EC 81 MG PO TBEC
81.0000 mg | DELAYED_RELEASE_TABLET | Freq: Every day | ORAL | Status: DC
  Administered 2016-08-06 – 2016-08-08 (×3): 81 mg via ORAL
  Filled 2016-08-06 (×3): qty 1

## 2016-08-06 MED ORDER — VITAMIN C 500 MG PO TABS
500.0000 mg | ORAL_TABLET | Freq: Two times a day (BID) | ORAL | Status: DC
  Administered 2016-08-06 – 2016-08-08 (×4): 500 mg via ORAL
  Filled 2016-08-06 (×4): qty 1

## 2016-08-06 MED ORDER — CALCIUM CARBONATE-VITAMIN D 500-200 MG-UNIT PO TABS
1.0000 | ORAL_TABLET | Freq: Every day | ORAL | Status: DC
  Administered 2016-08-07 – 2016-08-08 (×2): 1 via ORAL
  Filled 2016-08-06 (×3): qty 1

## 2016-08-06 MED ORDER — COLESEVELAM HCL 625 MG PO TABS
1875.0000 mg | ORAL_TABLET | Freq: Every day | ORAL | Status: DC
  Administered 2016-08-06 – 2016-08-07 (×2): 1875 mg via ORAL
  Filled 2016-08-06 (×2): qty 3

## 2016-08-06 MED ORDER — METHYLPREDNISOLONE SODIUM SUCC 125 MG IJ SOLR
125.0000 mg | Freq: Once | INTRAMUSCULAR | Status: AC
  Administered 2016-08-06: 125 mg via INTRAMUSCULAR

## 2016-08-06 MED ORDER — ENOXAPARIN SODIUM 40 MG/0.4ML ~~LOC~~ SOLN
40.0000 mg | Freq: Every day | SUBCUTANEOUS | Status: DC
  Administered 2016-08-07: 40 mg via SUBCUTANEOUS
  Filled 2016-08-06 (×3): qty 0.4

## 2016-08-06 MED ORDER — METFORMIN HCL 500 MG PO TABS
500.0000 mg | ORAL_TABLET | Freq: Two times a day (BID) | ORAL | Status: DC
  Administered 2016-08-06 – 2016-08-08 (×4): 500 mg via ORAL
  Filled 2016-08-06 (×4): qty 1

## 2016-08-06 MED ORDER — ATORVASTATIN CALCIUM 20 MG PO TABS
40.0000 mg | ORAL_TABLET | Freq: Every day | ORAL | Status: DC
  Filled 2016-08-06: qty 2

## 2016-08-06 MED ORDER — HYDROCHLOROTHIAZIDE 25 MG PO TABS
25.0000 mg | ORAL_TABLET | Freq: Every day | ORAL | Status: DC
  Administered 2016-08-07 – 2016-08-08 (×2): 25 mg via ORAL
  Filled 2016-08-06 (×3): qty 1

## 2016-08-06 MED ORDER — METHYLPREDNISOLONE SODIUM SUCC 40 MG IJ SOLR
40.0000 mg | Freq: Four times a day (QID) | INTRAMUSCULAR | Status: DC
  Administered 2016-08-06 – 2016-08-07 (×4): 40 mg via INTRAVENOUS
  Filled 2016-08-06 (×4): qty 1

## 2016-08-06 MED ORDER — TRAZODONE HCL 50 MG PO TABS
50.0000 mg | ORAL_TABLET | Freq: Every evening | ORAL | Status: DC | PRN
  Administered 2016-08-06 – 2016-08-07 (×2): 100 mg via ORAL
  Filled 2016-08-06 (×2): qty 2

## 2016-08-06 MED ORDER — NIFEDIPINE ER 30 MG PO TB24
30.0000 mg | ORAL_TABLET | Freq: Every day | ORAL | Status: DC
  Filled 2016-08-06: qty 1

## 2016-08-06 MED ORDER — FUROSEMIDE 10 MG/ML IJ SOLN
20.0000 mg | Freq: Two times a day (BID) | INTRAMUSCULAR | Status: DC
  Administered 2016-08-06 – 2016-08-07 (×2): 20 mg via INTRAVENOUS
  Filled 2016-08-06 (×2): qty 2

## 2016-08-06 MED ORDER — LEVETIRACETAM 750 MG PO TABS
750.0000 mg | ORAL_TABLET | Freq: Two times a day (BID) | ORAL | Status: DC
  Administered 2016-08-06 – 2016-08-08 (×4): 750 mg via ORAL
  Filled 2016-08-06 (×4): qty 1

## 2016-08-06 MED ORDER — ATORVASTATIN CALCIUM 20 MG PO TABS
40.0000 mg | ORAL_TABLET | Freq: Every day | ORAL | Status: DC
  Administered 2016-08-06 – 2016-08-07 (×2): 40 mg via ORAL
  Filled 2016-08-06 (×2): qty 2

## 2016-08-06 MED ORDER — VALSARTAN-HYDROCHLOROTHIAZIDE 320-25 MG PO TABS: 1.0000 | ORAL_TABLET | Freq: Every day | ORAL | Status: DC

## 2016-08-06 MED ORDER — NITROGLYCERIN 2 % TD OINT
0.5000 [in_us] | TOPICAL_OINTMENT | Freq: Once | TRANSDERMAL | Status: AC
  Administered 2016-08-06: 0.5 [in_us] via TOPICAL
  Filled 2016-08-06: qty 1

## 2016-08-06 MED ORDER — ONDANSETRON HCL 4 MG/2ML IJ SOLN: 4.0000 mg | Freq: Four times a day (QID) | INTRAMUSCULAR | Status: DC | PRN

## 2016-08-06 NOTE — ED Provider Notes (Signed)
[facility] ____________________________________________  Time seen: Approximately 1150 PM  I have reviewed the triage vital signs and the nursing notes.   HISTORY  Chief Complaint Shortness of Breath  HPI Angelica Juarez is a 75 y.o. female history of COPD and asthma presenting with husband at bedside for the complaint of shortness of breath. Patient reports she does have a chronic history of seasonal allergies and has had frequent runny nose and nasal congestion. Patient reports she woke up Thursday morning with shortness of breath and chest tightness. Patient reports she's noticed wheezing since. Patient reports she has used home albuterol nebulizer treatments without resolution. Patient reports last used earlier this morning. Patient denies any known triggers for her current symptoms.   Patient reports that yesterday she did take an over-the-counter Coricidin congestion medication and reports that after taking she had some chest pressure. Patient reports this was short lasting and fully resolved. Patient denies any chest pressure or chest discomfort since. Patient reports she has felt feverish but denies known fevers. Reports she did take Tylenol this morning. Patient reports she has had mild swelling in her ankles.  Denies abdominal pain, dysuria, extremity pain, fall, injury or recent immobilization. Reports right rotator cuff surgery few months ago.  PCP: Glendon Axe, MD  Pulmonology: Humphrey Rolls    Past Medical History:  Diagnosis Date  . Arthritis   . 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 Chronic kidney disease    chronic renal insufficiency  . Colon polyps 05/21/14  . COPD (chronic obstructive pulmonary disease) ([place])   . Diabetes mellitus without complication ([place])   . Fracture closed, humerus, shaft    right   . Hyperlipidemia 04/30/14  . Hypertension   . Impingement syndrome of right shoulder 12/29/15  . Pneumonia    hx  . Rotator cuff tear 04/21/16   right  . Seizures ([place])   . Sleep apnea     There are no active problems to display for this patient.   Past Surgical History:  Procedure Laterality Date  . ABDOMINAL HYSTERECTOMY    . 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 EYE SURGERY     bilateral cataracts  . JOINT REPLACEMENT     bilateral knee replacement  . NASAL SEPTUM SURGERY    . SHOULDER ARTHROSCOPY WITH BICEPSTENOTOMY Right 05/25/2016   Procedure: SHOULDER ARTHROSCOPY WITH BICEPSTENOTOMY;  Surgeon: Leanor Kail, MD;  Location: [facility] ORS;  Service: Orthopedics;  Laterality: Right;  . SHOULDER ARTHROSCOPY WITH DISTAL CLAVICLE RESECTION Right 05/25/2016   Procedure: SHOULDER ARTHROSCOPY WITH DISTAL CLAVICLE RESECTION;  Surgeon: Leanor Kail, MD;  Location: [facility] ORS;  Service: Orthopedics;  Laterality: Right;  . SHOULDER ARTHROSCOPY WITH OPEN ROTATOR CUFF REPAIR Right 05/25/2016   Procedure: SHOULDER ARTHROSCOPY WITH OPEN ROTATOR CUFF REPAIR;  Surgeon: Leanor Kail, MD;  Location: [facility] ORS;  Service: Orthopedics;  Laterality: Right;  . SUBACROMIAL DECOMPRESSION Right 05/25/2016   Procedure: SUBACROMIAL DECOMPRESSION;  Surgeon: Leanor Kail, MD;  Location: [facility] ORS;  Service: Orthopedics;  Laterality: Right;    Current Outpatient Rx  . Order #: [id number] Class: Historical Med  . Order #: [id number] Class: Historical Med  . Order #: [id number] Class: Historical Med  . Order #: [id number] Class: Historical Med  . Order #: [id number] Class: Historical Med  . Order #: [id number] Class: Historical Med  . Order #: [id number] Class: Historical Med  . Order #: [id number] Class: Historical Med  . Order #: [id number] Class: Historical Med  . Order #: [id number] Class: Historical Med  .  Order #: [id number] Class: Historical Med  . Order #: [id number] Class: Historical Med  . Order #: [id number] Class: Normal  . Order #: [id number] Class: Historical Med  . Order #: [id number] Class: Historical Med  . Order #: [id number] Class:  Historical Med  . Order #: [id number] Class: Print  . Order #: [id number] Class: Historical Med  . Order #: [id number] Class: Historical Med  . Order #: [id number] Class: Historical Med  . Order #: [id number] Class: Historical Med  . Order #: [id number] Class: Historical Med  . Order #: [id number] Class: Historical Med    No current facility-administered medications for this encounter.   Current Outpatient Prescriptions:  .  albuterol (ACCUNEB) 1.25 MG/3ML nebulizer solution, Take 1 ampule by nebulization every 6 (six) hours as needed. wheezing, Disp: , Rfl: 3 .  aspirin 81 MG tablet, Take 81 mg by mouth daily., Disp: , Rfl:  .  atenolol (TENORMIN) 100 MG tablet, Take 100 mg by mouth daily., Disp: , Rfl: 2 .  atorvastatin (LIPITOR) 40 MG tablet, Take 40 mg by mouth daily., Disp: , Rfl:  .  B Complex Vitamins (VITAMIN B COMPLEX PO), Take 1 tablet by mouth daily., Disp: , Rfl:  .  Calcium Carbonate-Vitamin D 600-200 MG-UNIT CAPS, Take 1 capsule by mouth daily., Disp: , Rfl:  .  cloNIDine (CATAPRES) 0.2 MG tablet, Take 0.2 mg by mouth 2 (two) times daily., Disp: , Rfl:  .  EPINEPHrine (EPIPEN 2-PAK) 0.3 mg/0.3 mL IJ SOAJ injection, 0.3 mg once. Reported on 05/25/2016, Disp: , Rfl:  .  fluticasone (FLONASE) 50 MCG/ACT nasal spray, Place 2 sprays into both nostrils daily., Disp: , Rfl: 2 .  hydrALAZINE (APRESOLINE) 100 MG tablet, Take 100 mg by mouth 3 (three) times daily., Disp: , Rfl:  .  levETIRAcetam (KEPPRA) 750 MG tablet, Take 750 mg by mouth 2 (two) times daily., Disp: , Rfl:  .  metFORMIN (GLUCOPHAGE) 500 MG tablet, Take 500 mg by mouth 2 (two) times daily with a meal., Disp: , Rfl:  .  montelukast (SINGULAIR) 10 MG tablet, TAKE 1 TABLET BY MOUTH EVERY DAY, Disp: 90 tablet, Rfl: 0 .  Multiple Vitamins-Minerals (HM HAIR/SKIN/NAILS) TABS, Take 1 tablet by mouth daily., Disp: , Rfl:  .  NIFEdipine (NIFEDICAL XL) 30 MG 24 hr tablet, Take 30 mg by mouth daily., Disp: , Rfl:  .  OVER THE COUNTER  MEDICATION, Place 1 drop into both eyes daily. Allergy eye drops, Disp: , Rfl:  .  ROXICET 5-325 MG tablet, Take 1-2 tablets by mouth every 6 (six) hours as needed for severe pain., Disp: 30 tablet, Rfl: 0 .  traMADol (ULTRAM) 50 MG tablet, Take 50 mg by mouth every 8 (eight) hours as needed. pain, Disp: , Rfl:  .  traZODone (DESYREL) 50 MG tablet, Take 50-100 mg by mouth at bedtime as needed. Sleep, Disp: , Rfl: 0 .  umeclidinium-vilanterol (ANORO ELLIPTA) 62.5-25 MCG/INH AEPB, Inhale 1 puff into the lungs daily., Disp: , Rfl:  .  valsartan-hydrochlorothiazide (DIOVAN-HCT) 320-25 MG tablet, Take 1 tablet by mouth daily., Disp: , Rfl: 0 .  vitamin C (ASCORBIC ACID) 500 MG tablet, Take 500 mg by mouth 2 (two) times daily., Disp: , Rfl:  .  WELCHOL 625 MG tablet, Take 1,875 mg by mouth daily with supper., Disp: , Rfl: 2  Allergies Contrast media [iodinated diagnostic agents]; Latex; Phenobarbital; and Tape  Family History  Problem Relation Age of Onset  . Breast cancer Paternal Aunt     Social History Social History  Substance Use Topics  .  Smoking status: Former Smoker    Quit date: 05/12/2006  . Smokeless tobacco: Never Used  . Alcohol use No    Review of Systems Constitutional: No fever/chills Eyes: No visual changes. ENT: No sore throat. Cardiovascular: Denies chest pain. Respiratory:Positives of breath. Gastrointestinal: No abdominal pain.  No nausea, no vomiting.  No diarrhea.  No constipation. Genitourinary: Negative for dysuria. Musculoskeletal: Negative for back pain. Skin: Negative for rash. Neurological: Negative for headaches, focal weakness or numbness.  10-point ROS otherwise negative.  ____________________________________________   PHYSICAL EXAM:  VITAL SIGNS: ED Triage Vitals  Enc Vitals Group     BP 08/06/16 1118 (!) 166/86     Pulse Rate 08/06/16 1118 70     Resp 08/06/16 1118 20     Temp 08/06/16 1118 98 F (36.7 C)     Temp Source 08/06/16 1118  Tympanic     SpO2 08/06/16 1118 91 %     Weight 08/06/16 1118 240 lb (108.9 kg)     Height 08/06/16 1118 5\' 7"  (1.702 m)     Head Circumference --      Peak Flow --      Pain Score 08/06/16 1119 0     Pain Loc --      Pain Edu? --      Excl. in [place]? --    Today's Vitals   08/06/16 1119 08/06/16 1220 08/06/16 1250 08/06/16 1318  BP:  (!) 149/66    Pulse:  84 79   Resp:      Temp:      TempSrc:      SpO2:  91% 95%   Weight:      Height:      PainSc: 0-No pain   0-No pain     Constitutional: Alert and oriented. Well appearing and in no acute distress. Eyes: Conjunctivae are normal. PERRL. EOMI. Head: Atraumatic. No sinus tenderness to palpation. No swelling. No erythema.  Ears: no erythema, normal TMs bilaterally.   Nose:Mild nasal congestion   Mouth/Throat: Mucous membranes are moist. No pharyngeal erythema. No tonsillar swelling or exudate.  Neck: No stridor.  No cervical spine tenderness to palpation. Hematological/Lymphatic/Immunilogical: No cervical lymphadenopathy. Cardiovascular: Normal rate, regular rhythm. Grossly normal heart sounds.  Good peripheral circulation. Respiratory: Speaks in 3-4 word sentences. Decreased air movement throughout with diffuse wheezes. Occasional cough noted.  Gastrointestinal: Soft and nontender.  No CVA tenderness. Musculoskeletal: No lower or upper extremity tenderness. Mild right ankle edema. No cervical, thoracic or lumbar tenderness to palpation. Neurologic:  Normal speech and language. No gross focal neurologic deficits are appreciated. No gait instability. Skin:  Skin is warm, dry and intact. No rash noted. Psychiatric: Mood and affect are normal. Speech and behavior are normal.  ___________________________________________   LABS (all labs ordered are listed, but only abnormal results are displayed)  Labs Reviewed - No data to display ____________________________________________  EKG  ED ECG REPORT I, Marylene Land, the  attending provider and Dr Zenda Alpers, personally viewed and interpreted this ECG.   Date: 08/06/2016  EKG Time: 1242  Rate: 86  Rhythm: sinus rhythm, cannot rule out anterior infarct.  Axis: normal  ST&T Change: none  ____________________________________________  RADIOLOGY  Dg Chest 2 View  Result Date: 08/06/2016 CLINICAL DATA:  Short of breath and wheezing for 3 days EXAM: CHEST  2 VIEW COMPARISON:  04/18/2014 FINDINGS: Moderate cardiomegaly. Vascular congestion. Predominately interstitial pulmonary edema bilaterally. No pleural effusion or pneumothorax. IMPRESSION: CHF with predominately interstitial pulmonary edema. Electronically Signed  By: Marybelle Killings M.D.   On: 08/06/2016 12:19   ____________________________________________   PROCEDURES Procedures    INITIAL IMPRESSION / ASSESSMENT AND PLAN / ED COURSE  Pertinent labs & imaging results that were available during my care of the patient were reviewed by me and considered in my medical decision making (see chart for details).  Patient presenting for complaints of shortness of breath. Patient with past history of asthma and COPD, patient reports that her current symptoms feel consistent with her COPD exacerbation. Wheezing throughout 91% oxygenation on room air at rest, 88% ambulatory oxygenation. After two duonebs, and 125 mg IM Solu-Medrol, patient reevaluated and continues with diffuse wheezing and 91% oxygenation. 2 L O2 applied and oxygenation 95%. Chest x-ray reviewed, chest x-ray per radiologist CHF with predominately interstitial pulmonary edema. Denies history of CHF. Discussed in detail with patient and husband recommended patient be further evaluated in emergency room and recommend EMS transportation. Patient and husband agrees to this plan. Velna Hatchet RN charge nurse [facility] cautery report given to you. Patient stable at the time of discharge and transfer.  Discussed follow up with Primary care  physician this week. Discussed follow up and return parameters including no resolution or any worsening concerns. Patient verbalized understanding and agreed to plan.   ____________________________________________   FINAL CLINICAL IMPRESSION(S) / ED DIAGNOSES  Final diagnoses:  SOB (shortness of breath)  Acute pulmonary edema [facility])     Discharge Medication List as of 08/06/2016  1:19 PM      Note: This dictation was prepared with Dragon dictation along with smaller phrase technology. Any transcriptional errors that result from this process are unintentional.    Clinical Course      Marylene Land, NP 08/06/16 1353

## 2016-08-06 NOTE — H&P (Signed)
[place] [facility] [place] NAME: Angelica Juarez    MR#:  [id number]  DATE OF BIRTH:  Jan 15, 1941  DATE OF ADMISSION:  08/06/2016  PRIMARY CARE PHYSICIAN: Glendon Axe, MD   REQUESTING/REFERRING PHYSICIAN: Dr. Conni Slipper  CHIEF COMPLAINT:   Chief Complaint  Patient presents with  . Shortness of Breath    HISTORY OF PRESENT ILLNESS:  Angelica Juarez  is a 75 y.o. female with a known history of COPD, diabetes, hypertension, hyperlipidemia, chronic kidney disease stage II, obstructive sleep apnea, seizures and presented to the hospital due to shortness of breath. Patient says she sees been short of breath now for the past 2-3 days progressively getting worse. She admits to a cough and congestion with nonproductive. She admits to some chills but no diagnostic fevers noted. She denies any chest pains, nausea, vomiting or any recent sick contacts. She went to urgent care today as she was not improving and she was noted to be hypoxic with O2 sats in the mid 80s. She was noted to have scattered wheezing throughout her lungs and her chest x-ray findings are suggestive of pulmonary edema. She was transferred to the emergency room and noted to be in acute respiratory failure with hypoxia secondary to COPD and also mild CHF. Hospitalist services were contacted further treatment and evaluation.  PAST MEDICAL HISTORY:   Past Medical History:  Diagnosis Date  . Arthritis   . 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 Chronic kidney disease    chronic renal insufficiency  . Colon polyps 05/21/14  . COPD (chronic obstructive pulmonary disease) ([place])   . Diabetes mellitus without complication ([place])   . Fracture closed, humerus, shaft    right   . Hyperlipidemia 04/30/14  . Hypertension   . Impingement syndrome of right shoulder 12/29/15  . Pneumonia    hx  . Rotator cuff tear 04/21/16   right  . Seizures ([place])   . Sleep apnea     PAST SURGICAL HISTORY:   Past  Surgical History:  Procedure Laterality Date  . ABDOMINAL HYSTERECTOMY    . 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 EYE SURGERY     bilateral cataracts  . JOINT REPLACEMENT     bilateral knee replacement  . NASAL SEPTUM SURGERY    . SHOULDER ARTHROSCOPY WITH BICEPSTENOTOMY Right 05/25/2016   Procedure: SHOULDER ARTHROSCOPY WITH BICEPSTENOTOMY;  Surgeon: Leanor Kail, MD;  Location: [facility] ORS;  Service: Orthopedics;  Laterality: Right;  . SHOULDER ARTHROSCOPY WITH DISTAL CLAVICLE RESECTION Right 05/25/2016   Procedure: SHOULDER ARTHROSCOPY WITH DISTAL CLAVICLE RESECTION;  Surgeon: Leanor Kail, MD;  Location: [facility] ORS;  Service: Orthopedics;  Laterality: Right;  . SHOULDER ARTHROSCOPY WITH OPEN ROTATOR CUFF REPAIR Right 05/25/2016   Procedure: SHOULDER ARTHROSCOPY WITH OPEN ROTATOR CUFF REPAIR;  Surgeon: Leanor Kail, MD;  Location: [facility] ORS;  Service: Orthopedics;  Laterality: Right;  . SUBACROMIAL DECOMPRESSION Right 05/25/2016   Procedure: SUBACROMIAL DECOMPRESSION;  Surgeon: Leanor Kail, MD;  Location: [facility] ORS;  Service: Orthopedics;  Laterality: Right;    SOCIAL HISTORY:   Social History  Substance Use Topics  . Smoking status: Former Smoker    Packs/day: 1.00    Years: 40.00    Quit date: 05/12/2006  . Smokeless tobacco: Never Used  . Alcohol use No    FAMILY HISTORY:   Family History  Problem Relation Age of Onset  . Breast cancer Paternal Aunt   .  Breast cancer Mother   . Diabetes Father   . Heart disease Father     DRUG ALLERGIES:   Allergies  Allergen Reactions  . Contrast Media [Iodinated Diagnostic Agents] Anaphylaxis  . Latex Itching  . Phenobarbital Hives  . Tape Rash    silicones    REVIEW OF SYSTEMS:   Review of Systems  Constitutional: Negative for fever and weight loss.  HENT: Negative for congestion, nosebleeds and tinnitus.   Eyes: Negative for blurred vision, double vision and redness.  Respiratory: Positive for  cough, shortness of breath and wheezing. Negative for hemoptysis.   Cardiovascular: Negative for chest pain, orthopnea, leg swelling and PND.  Gastrointestinal: Negative for abdominal pain, diarrhea, melena, nausea and vomiting.  Genitourinary: Negative for dysuria, hematuria and urgency.  Musculoskeletal: Negative for falls and joint pain.  Neurological: Negative for dizziness, tingling, sensory change, focal weakness, seizures, weakness and headaches.  Endo/Heme/Allergies: Negative for polydipsia. Does not bruise/bleed easily.  Psychiatric/Behavioral: Negative for depression and memory loss. The patient is not nervous/anxious.     MEDICATIONS AT HOME:   Prior to Admission medications   Medication Sig Start Date End Date Taking? Authorizing Provider  aspirin 81 MG tablet Take 81 mg by mouth daily.   Yes Historical Provider, MD  atenolol (TENORMIN) 100 MG tablet Take 100 mg by mouth daily. 04/25/16  Yes Historical Provider, MD  atorvastatin (LIPITOR) 40 MG tablet Take 40 mg by mouth daily. 04/04/16  Yes Historical Provider, MD  B Complex Vitamins (VITAMIN B COMPLEX PO) Take 1 tablet by mouth daily.   Yes Historical Provider, MD  Calcium Carbonate-Vitamin D 600-200 MG-UNIT CAPS Take 1 capsule by mouth daily.   Yes Historical Provider, MD  cloNIDine (CATAPRES) 0.2 MG tablet Take 0.2 mg by mouth 2 (two) times daily.   Yes Historical Provider, MD  fluticasone (FLONASE) 50 MCG/ACT nasal spray Place 2 sprays into both nostrils daily. 04/22/16  Yes Historical Provider, MD  hydrALAZINE (APRESOLINE) 100 MG tablet Take 100 mg by mouth 3 (three) times daily. 02/04/16  Yes Historical Provider, MD  levETIRAcetam (KEPPRA) 750 MG tablet Take 750 mg by mouth 2 (two) times daily. 03/14/16  Yes Historical Provider, MD  metFORMIN (GLUCOPHAGE) 500 MG tablet Take 500 mg by mouth 2 (two) times daily with a meal. 12/25/15  Yes Historical Provider, MD  montelukast (SINGULAIR) 10 MG tablet TAKE 1 TABLET BY MOUTH EVERY DAY  07/02/15  Yes Kathrine Haddock, NP  NIFEdipine (NIFEDICAL XL) 30 MG 24 hr tablet Take 30 mg by mouth daily. 09/30/15  Yes Historical Provider, MD  OVER THE COUNTER MEDICATION Place 1 drop into both eyes daily. Allergy eye drops   Yes Historical Provider, MD  traMADol (ULTRAM) 50 MG tablet Take 50 mg by mouth every 8 (eight) hours as needed. pain 03/06/16  Yes Historical Provider, MD  traZODone (DESYREL) 50 MG tablet Take 50-100 mg by mouth at bedtime as needed. Sleep 04/27/16  Yes Historical Provider, MD  umeclidinium-vilanterol (ANORO ELLIPTA) 62.5-25 MCG/INH AEPB Inhale 1 puff into the lungs daily.   Yes Historical Provider, MD  valsartan-hydrochlorothiazide (DIOVAN-HCT) 320-25 MG tablet Take 1 tablet by mouth daily. 04/14/16  Yes Historical Provider, MD  vitamin C (ASCORBIC ACID) 500 MG tablet Take 500 mg by mouth 2 (two) times daily.   Yes Historical Provider, MD  [facility] 625 MG tablet Take 1,875 mg by mouth daily with supper. 04/27/16  Yes Historical Provider, MD  albuterol (ACCUNEB) 1.25 MG/3ML nebulizer solution Take 1 ampule by nebulization  every 6 (six) hours as needed. wheezing 04/27/16   Historical Provider, MD  EPINEPHrine (EPIPEN 2-PAK) 0.3 mg/0.3 mL IJ SOAJ injection 0.3 mg once. Reported on 05/25/2016 03/27/15   Historical Provider, MD  ROXICET 5-325 MG tablet Take 1-2 tablets by mouth every 6 (six) hours as needed for severe pain. Patient not taking: Reported on 08/06/2016 05/25/16   Leanor Kail, MD      VITAL SIGNS:  Blood pressure (!) 164/74, pulse 71, temperature 97.7 F (36.5 C), resp. rate (!) 22, height 5\' 7"  (1.702 m), weight 108.9 kg (240 lb), SpO2 96 %.  PHYSICAL EXAMINATION:  Physical Exam  GENERAL:  76 y.o.-year-old patient lying in the bed in mild resp. distress.  EYES: Pupils equal, round, reactive to light and accommodation. No scleral icterus. Extraocular muscles intact.  HEENT: Head atraumatic, normocephalic. Oropharynx and nasopharynx clear. No oropharyngeal erythema,  moist oral mucosa  NECK:  Supple, no jugular venous distention. No thyroid enlargement, no tenderness.  LUNGS: Good air entry bilaterally, diffuse end expiratory wheezing bilaterally no rhonchi, rales. Negative use of accessory muscles.  CARDIOVASCULAR: S1, S2 RRR. No murmurs, rubs, gallops, clicks.  ABDOMEN: Soft, nontender, nondistended. Bowel sounds present. No organomegaly or mass.  EXTREMITIES: No pedal edema, cyanosis, or clubbing. + 2 pedal & radial pulses b/l.   NEUROLOGIC: Cranial nerves II through XII are intact. No focal Motor or sensory deficits appreciated b/l PSYCHIATRIC: The patient is alert and oriented x 3. Good affect.  SKIN: No obvious rash, lesion, or ulcer.   LABORATORY PANEL:   CBC  Recent Labs Lab 08/06/16 1408  WBC 15.0*  HGB 14.2  HCT 40.7  PLT 378   ------------------------------------------------------------------------------------------------------------------  Chemistries   Recent Labs Lab 08/06/16 1408  NA 134*  K 3.5  CL 99*  CO2 26  GLUCOSE 155*  BUN 10  CREATININE 0.74  CALCIUM 8.9  AST 30  ALT 41  ALKPHOS 86  BILITOT 1.2   ------------------------------------------------------------------------------------------------------------------  Cardiac Enzymes  Recent Labs Lab 08/06/16 1408  TROPONINI <0.03   ------------------------------------------------------------------------------------------------------------------  RADIOLOGY:  Dg Chest 2 View  Result Date: 08/06/2016 CLINICAL DATA:  Short of breath and wheezing for 3 days EXAM: CHEST  2 VIEW COMPARISON:  04/18/2014 FINDINGS: Moderate cardiomegaly. Vascular congestion. Predominately interstitial pulmonary edema bilaterally. No pleural effusion or pneumothorax. IMPRESSION: CHF with predominately interstitial pulmonary edema. Electronically Signed   By: Marybelle Killings M.D.   On: 08/06/2016 12:19     IMPRESSION AND PLAN:   75 year old female with past medical history of COPD,  hypertension, diabetes, hyperlipidemia, history of seizures, obstructive sleep apnea, chronic kidney disease who presented to the hospital due to shortness of breath.   1. Acute respiratory failure with hypoxia-secondary to COPD exacerbation also mild CHF. -We'll treat underlying CHF with IV diuretics, follow I's and O's and daily weights. Patient was on BiPAP in the ER and has been weaned off of it already. -For the underlying COPD I will place on IV steroids, scheduled DuoNeb's and Pulmicort nebs. Follow clinically.  2. 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 I will diuresis with IV Lasix, follow I's and O's and daily weights. -Patient had a recent nuclear medicine stress test on in May which showed normal ejection fraction.  3. COPD exacerbation-Place on IV steroids, scheduled DuoNeb's, Pulmicort nebs. -She has been weaned off BiPAP. Continue O2 supplementation. Assessment home oxygen prior to discharge.  4. Essential hypertension-continue atenolol, clonidine, hydralazine, valsartan/HCTZ, Nifedipine.  5. Diabetes-continue metformin.  6. Seizures-continue Keppra.  7. Hyperlipidemia-continue atorvastatin.  All the records are reviewed and case discussed with ED provider. Management plans discussed with the patient, family and they are in agreement.  CODE STATUS: Full  TOTAL TIME TAKING CARE OF THIS PATIENT: 45 minutes.    Henreitta Leber M.D on 08/06/2016 at 3:40 PM  Between 7am to 6pm - Pager - [phone]  After 6pm go to [email or web address] - password [email or web address] [place] [facility]  Office  [phone]  CC: Primary care physician; Glendon Axe, MD

## 2016-08-06 NOTE — ED Notes (Signed)
EMS called to transport patient to [facility] ED 

## 2016-08-06 NOTE — Progress Notes (Signed)
While rounding, CH was referred to Pt from Camera operator. Pt was transferred from [facility] because of shortness of breath. I initiated a visit, but Pt was being cared for by nurses. Offered a prayer outside of the doorway. I am available for follow up as needed.    08/06/16 1405  Clinical Encounter Type  Visited With Patient;Health care provider  Visit Type Initial;ED  Referral From Nurse  Spiritual Encounters  Spiritual Needs Prayer  Stress Factors  Patient Stress Factors Health changes  Advance Directives (For Healthcare)  Does patient have an advance directive? Yes  Type of Advance Directive Living will  Does patient want to make changes to advanced directive? No - Patient declined  Copy of advanced directive(s) in chart? No - copy requested

## 2016-08-06 NOTE — ED Triage Notes (Signed)
Pt to ED from [facility] c/o SOB. Per EMS pt has experienced SOB & wheezing for 2 days, at [facility] chest x-ray indicated pulmonary edema. Pt has hx of COPD, CHF, & Asthma.

## 2016-08-06 NOTE — ED Notes (Signed)
Informed RN bed ready 

## 2016-08-06 NOTE — ED Triage Notes (Signed)
Patient c/o SOB and wheezing that started on Thursday.  Patient reports history of COPD and Asthma.

## 2016-08-06 NOTE — ED Provider Notes (Signed)
[facility] Emergency Department Provider Note   ____________________________________________   First MD Initiated Contact with Patient 08/06/16 1356     (approximate)  I have reviewed the triage vital signs and the nursing notes.   HISTORY  Chief Complaint Shortness of Breath    HPI Angelica Juarez is a 75 y.o. female patient reports several days of increasing shortness of breath. She says she's been coughing up thick dark phlegm. She says she took some Coricidin a couple times last 2 days H time got some palpitations tachycardia and a little bit of chest pressure with this. She has not had any other chest tightness or heaviness. She says she has felt feverish but does not have a thermometer that's working at home. Symptoms again have been increasing gradually for several days.   Past Medical History:  Diagnosis Date  . Arthritis   . 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 Chronic kidney disease    chronic renal insufficiency  . Colon polyps 05/21/14  . COPD (chronic obstructive pulmonary disease) ([place])   . Diabetes mellitus without complication ([place])   . Fracture closed, humerus, shaft    right   . Hyperlipidemia 04/30/14  . Hypertension   . Impingement syndrome of right shoulder 12/29/15  . Pneumonia    hx  . Rotator cuff tear 04/21/16   right  . Seizures ([place])   . Sleep apnea     There are no active problems to display for this patient.   Past Surgical History:  Procedure Laterality Date  . ABDOMINAL HYSTERECTOMY    . 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 EYE SURGERY     bilateral cataracts  . JOINT REPLACEMENT     bilateral knee replacement  . NASAL SEPTUM SURGERY    . SHOULDER ARTHROSCOPY WITH BICEPSTENOTOMY Right 05/25/2016   Procedure: SHOULDER ARTHROSCOPY WITH BICEPSTENOTOMY;  Surgeon: Leanor Kail, MD;  Location: [facility] ORS;  Service: Orthopedics;  Laterality: Right;  . SHOULDER ARTHROSCOPY WITH DISTAL  CLAVICLE RESECTION Right 05/25/2016   Procedure: SHOULDER ARTHROSCOPY WITH DISTAL CLAVICLE RESECTION;  Surgeon: Leanor Kail, MD;  Location: [facility] ORS;  Service: Orthopedics;  Laterality: Right;  . SHOULDER ARTHROSCOPY WITH OPEN ROTATOR CUFF REPAIR Right 05/25/2016   Procedure: SHOULDER ARTHROSCOPY WITH OPEN ROTATOR CUFF REPAIR;  Surgeon: Leanor Kail, MD;  Location: [facility] ORS;  Service: Orthopedics;  Laterality: Right;  . SUBACROMIAL DECOMPRESSION Right 05/25/2016   Procedure: SUBACROMIAL DECOMPRESSION;  Surgeon: Leanor Kail, MD;  Location: [facility] ORS;  Service: Orthopedics;  Laterality: Right;    Prior to Admission medications   Medication Sig Start Date End Date Taking? Authorizing Provider  albuterol (ACCUNEB) 1.25 MG/3ML nebulizer solution Take 1 ampule by nebulization every 6 (six) hours as needed. wheezing 04/27/16   Historical Provider, MD  aspirin 81 MG tablet Take 81 mg by mouth daily.    Historical Provider, MD  atenolol (TENORMIN) 100 MG tablet Take 100 mg by mouth daily. 04/25/16   Historical Provider, MD  atorvastatin (LIPITOR) 40 MG tablet Take 40 mg by mouth daily. 04/04/16   Historical Provider, MD  B Complex Vitamins (VITAMIN B COMPLEX PO) Take 1 tablet by mouth daily.    Historical Provider, MD  Calcium Carbonate-Vitamin D 600-200 MG-UNIT CAPS Take 1 capsule by mouth daily.    Historical Provider, MD  cloNIDine (CATAPRES) 0.2 MG tablet Take 0.2 mg by mouth 2 (two) times daily.    Historical Provider, MD  EPINEPHrine (EPIPEN 2-PAK) 0.3 mg/0.3 mL IJ SOAJ injection 0.3 mg once. Reported on 05/25/2016 03/27/15   Historical Provider, MD  fluticasone (FLONASE) 50 MCG/ACT nasal spray Place 2 sprays into both nostrils daily. 04/22/16   Historical Provider, MD  hydrALAZINE (APRESOLINE) 100 MG tablet Take 100 mg by mouth 3 (three) times daily. 02/04/16   Historical Provider, MD  levETIRAcetam (KEPPRA) 750 MG tablet Take 750 mg by mouth 2 (two) times daily. 03/14/16   Historical Provider, MD    metFORMIN (GLUCOPHAGE) 500 MG tablet Take 500 mg by mouth 2 (two) times daily with a meal. 12/25/15   Historical Provider, MD  montelukast (SINGULAIR) 10 MG tablet TAKE 1 TABLET BY MOUTH EVERY DAY 07/02/15   Kathrine Haddock, NP  Multiple Vitamins-Minerals (HM HAIR/SKIN/NAILS) TABS Take 1 tablet by mouth daily.    Historical Provider, MD  NIFEdipine (NIFEDICAL XL) 30 MG 24 hr tablet Take 30 mg by mouth daily. 09/30/15   Historical Provider, MD  OVER THE COUNTER MEDICATION Place 1 drop into both eyes daily. Allergy eye drops    Historical Provider, MD  ROXICET 5-325 MG tablet Take 1-2 tablets by mouth every 6 (six) hours as needed for severe pain. 05/25/16   Leanor Kail, MD  traMADol (ULTRAM) 50 MG tablet Take 50 mg by mouth every 8 (eight) hours as needed. pain 03/06/16   Historical Provider, MD  traZODone (DESYREL) 50 MG tablet Take 50-100 mg by mouth at bedtime as needed. Sleep 04/27/16   Historical Provider, MD  umeclidinium-vilanterol (ANORO ELLIPTA) 62.5-25 MCG/INH AEPB Inhale 1 puff into the lungs daily.    Historical Provider, MD  valsartan-hydrochlorothiazide (DIOVAN-HCT) 320-25 MG tablet Take 1 tablet by mouth daily. 04/14/16   Historical Provider, MD  vitamin C (ASCORBIC ACID) 500 MG tablet Take 500 mg by mouth 2 (two) times daily.    Historical Provider, MD  [facility] 625 MG tablet Take 1,875 mg by mouth daily with supper. 04/27/16   Historical Provider, MD    Allergies Contrast media [iodinated diagnostic agents]; Latex; Phenobarbital; and Tape  Family History  Problem Relation Age of Onset  . Breast cancer Paternal Aunt     Social History Social History  Substance Use Topics  . Smoking status: Former Smoker    Quit date: 05/12/2006  . Smokeless tobacco: Never Used  . Alcohol use No    Review of Systems Constitutional: Subjective fever/chills Eyes: No visual changes. ENT: No sore throat. Cardiovascular: Denies chest pain. Respiratory: shortness of breath. Gastrointestinal: No  abdominal pain.  No nausea, no vomiting.  No diarrhea.  No constipation. Genitourinary: Negative for dysuria. Musculoskeletal: Negative for back pain. Skin: Negative for rash.  10-point ROS otherwise negative.  ____________________________________________   PHYSICAL EXAM:  VITAL SIGNS: ED Triage Vitals  Enc Vitals Group     BP 08/06/16 1359 (!) 155/76     Pulse Rate 08/06/16 1359 77     Resp 08/06/16 1359 18     Temp 08/06/16 1359 97.7 F (36.5 C)     Temp src --      SpO2 08/06/16 1354 99 %     Weight 08/06/16 1402 240 lb (108.9 kg)     Height 08/06/16 1402 5\' 7"  (1.702 m)     Head Circumference --      Peak Flow --      Pain Score --      Pain Loc --      Pain Edu? --      Excl. in [place]? --  Constitutional: Alert and oriented. Well appearing and in no acute distress. Eyes: Conjunctivae are normal. PERRL. EOMI. Head: Atraumatic. Nose: No congestion/rhinnorhea. Mouth/Throat: Mucous membranes are moist.  Oropharynx non-erythematous. Neck: No stridor. Patient does have JVD bilaterally Cardiovascular: Normal rate, regular rhythm. Grossly normal heart sounds.  Good peripheral circulation. Respiratory: Increased respiratory effort and wheezing throughout. Patient seems tight Gastrointestinal: Soft and nontender. No distention. No abdominal bruits. No CVA tenderness. Musculoskeletal: No lower extremity tenderness trace edema bilaterally  No joint effusions.  ____________________________________________   LABS (all labs ordered are listed, but only abnormal results are displayed)  Labs Reviewed  COMPREHENSIVE METABOLIC PANEL - Abnormal; Notable for the following:       Result Value   Sodium 134 (*)    Chloride 99 (*)    Glucose, Bld 155 (*)    All other components within normal limits  BRAIN NATRIURETIC PEPTIDE - Abnormal; Notable for the following:    B Natriuretic Peptide 406.0 (*)    All other components within normal limits  CBC WITH DIFFERENTIAL/PLATELET -  Abnormal; Notable for the following:    WBC 15.0 (*)    Neutro Abs 13.7 (*)    Lymphs Abs 0.3 (*)    All other components within normal limits  TROPONIN I  LACTIC ACID, PLASMA  LACTIC ACID, PLASMA  URINALYSIS COMPLETEWITH MICROSCOPIC ([facility] ONLY)   ____________________________________________  EKG  EKG done at urgent care read by me here shows normal sinus rhythm rate of 86 left axis with frequent PVCs. ____________________________________________  RADIOLOGY  Chest x-ray done in clinic read by rad at the lowest.iologist shows congestive heart failure increased vascularization. ____________________________________________   PROCEDURES  Procedure(s) performed:  Procedures  Critical Care performed:   ____________________________________________ New-onset congestive heart failure  INITIAL IMPRESSION / ASSESSMENT AND PLAN / ED COURSE  Pertinent labs & imaging results that were available during my care of the patient were reviewed by me and considered in my medical decision making (see chart for details).  O2 sats 88  Clinical Course     ____________________________________________   FINAL CLINICAL IMPRESSION(S) / ED DIAGNOSES  Final diagnoses:  COPD exacerbation (HCC)  Acute diastolic congestive heart failure (HCC)      NEW MEDICATIONS STARTED DURING THIS VISIT:  New Prescriptions   No medications on file     Note:  This document was prepared using Dragon voice recognition software and may include unintentional dictation errors.    Nena Polio, MD 08/06/16 [phone]

## 2016-08-07 LAB — CBC
HCT: 38.6 % (ref 35.0–47.0)
Hemoglobin: 13.6 g/dL (ref 12.0–16.0)
MCH: 30.6 pg (ref 26.0–34.0)
MCHC: 35.3 g/dL (ref 32.0–36.0)
MCV: 86.8 fL (ref 80.0–100.0)
Platelets: 402 10*3/uL (ref 150–440)
RBC: 4.45 MIL/uL (ref 3.80–5.20)
RDW: 14.1 % (ref 11.5–14.5)
WBC: 17.6 10*3/uL — ABNORMAL HIGH (ref 3.6–11.0)

## 2016-08-07 LAB — BASIC METABOLIC PANEL
ANION GAP: 8 (ref 5–15)
BUN: 20 mg/dL (ref 6–20)
CALCIUM: 8.8 mg/dL — AB (ref 8.9–10.3)
CO2: 26 mmol/L (ref 22–32)
Chloride: 100 mmol/L — ABNORMAL LOW (ref 101–111)
Creatinine, Ser: 0.85 mg/dL (ref 0.44–1.00)
GFR calc Af Amer: >60 mL/min (ref >60)
GLUCOSE: 276 mg/dL — AB (ref 65–99)
Potassium: 3.7 mmol/L (ref 3.5–5.1)
Sodium: 134 mmol/L — ABNORMAL LOW (ref 135–145)

## 2016-08-07 MED ORDER — METHYLPREDNISOLONE SODIUM SUCC 40 MG IJ SOLR
40.0000 mg | Freq: Two times a day (BID) | INTRAMUSCULAR | Status: DC
  Administered 2016-08-07: 21:00:00 40 mg via INTRAVENOUS
  Filled 2016-08-07: qty 1

## 2016-08-07 MED ORDER — FUROSEMIDE 20 MG PO TABS
20.0000 mg | ORAL_TABLET | Freq: Two times a day (BID) | ORAL | Status: DC
  Administered 2016-08-07 – 2016-08-08 (×2): 20 mg via ORAL
  Filled 2016-08-07 (×2): qty 1

## 2016-08-07 NOTE — Progress Notes (Signed)
[place] at [place] NAME: Angelica Juarez    MR#:  [id number]  DATE OF BIRTH:  08-12-1941  SUBJECTIVE:  admitted for acute respiratory failure secondary to COPD exacerbation, mild CHF. O2 saturations 80% on room air when she is admitted. Today she feels better. Less short of breath. No cough.   CHIEF COMPLAINT:   Chief Complaint  Patient presents with  . Shortness of Breath    REVIEW OF SYSTEMS:   ROS CONSTITUTIONAL: No fever, fatigue or weakness.  EYES: No blurred or double vision.  EARS, NOSE, AND THROAT: No tinnitus or ear pain.  RESPIRATORY: SOB improved.  CARDIOVASCULAR: No chest pain, orthopnea, edema.  GASTROINTESTINAL: No nausea, vomiting, diarrhea or abdominal pain.  GENITOURINARY: No dysuria, hematuria.  ENDOCRINE: No polyuria, nocturia,  HEMATOLOGY: No anemia, easy bruising or bleeding SKIN: No rash or lesion. MUSCULOSKELETAL: No joint pain or arthritis.   NEUROLOGIC: No tingling, numbness, weakness.  PSYCHIATRY: No anxiety or depression.   DRUG ALLERGIES:   Allergies  Allergen Reactions  . Contrast Media [Iodinated Diagnostic Agents] Anaphylaxis  . Latex Itching  . Phenobarbital Hives  . Tape Rash    silicones    VITALS:  Blood pressure (!) 142/64, pulse 68, temperature 98.1 F (36.7 C), temperature source Oral, resp. rate 20, height 5\' 7"  (1.702 m), weight 108.9 kg (240 lb), SpO2 95 %.  PHYSICAL EXAMINATION:  GENERAL:  75 y.o.-year-old patient lying in the bed with no acute distress.  EYES: Pupils equal, round, reactive to light and accommodation. No scleral icterus. Extraocular muscles intact.  HEENT: Head atraumatic, normocephalic. Oropharynx and nasopharynx clear.  NECK:  Supple, no jugular venous distention. No thyroid enlargement, no tenderness.  LUNGS: Patient has wheezing bilaterally.  No rales,rhonchi or crepitation. No use of accessory muscles of respiration.  CARDIOVASCULAR: S1, S2 normal. No  murmurs, rubs, or gallops.  ABDOMEN: Soft, nontender, nondistended. Bowel sounds present. No organomegaly or mass.  EXTREMITIES: No pedal edema, cyanosis, or clubbing.  NEUROLOGIC: Cranial nerves II through XII are intact. Muscle strength 5/5 in all extremities. Sensation intact. Gait not checked.  PSYCHIATRIC: The patient is alert and oriented x 3.  SKIN: No obvious rash, lesion, or ulcer.    LABORATORY PANEL:   CBC  Recent Labs Lab 08/07/16 0450  WBC 17.6*  HGB 13.6  HCT 38.6  PLT 402   ------------------------------------------------------------------------------------------------------------------  Chemistries   Recent Labs Lab 08/06/16 1408 08/07/16 0450  NA 134* 134*  K 3.5 3.7  CL 99* 100*  CO2 26 26  GLUCOSE 155* 276*  BUN 10 20  CREATININE 0.74 0.85  CALCIUM 8.9 8.8*  AST 30  --   ALT 41  --   ALKPHOS 86  --   BILITOT 1.2  --    ------------------------------------------------------------------------------------------------------------------  Cardiac Enzymes  Recent Labs Lab 08/06/16 1408  TROPONINI <0.03   ------------------------------------------------------------------------------------------------------------------  RADIOLOGY:  Dg Chest 2 View  Result Date: 08/06/2016 CLINICAL DATA:  Short of breath and wheezing for 3 days EXAM: CHEST  2 VIEW COMPARISON:  04/18/2014 FINDINGS: Moderate cardiomegaly. Vascular congestion. Predominately interstitial pulmonary edema bilaterally. No pleural effusion or pneumothorax. IMPRESSION: CHF with predominately interstitial pulmonary edema. Electronically Signed   By: Marybelle Killings M.D.   On: 08/06/2016 12:19    EKG:   Orders placed or performed during the hospital encounter of 08/06/16  . ED EKG  . ED EKG  . EKG 12-Lead  . EKG 12-Lead    ASSESSMENT AND  PLAN:   Acute respiratory failure with hypoxia secondary to COPD exacerbation, mild CHF: Patient improving with IV Lasix, IV Solu-Medrol, nebulizers.;  Oxygen saturation 95% on 1 L. Vitals are stable. Decrease IV steroids to 40 mg every 12 hours. Continue nebulizers. #2 mild CHF: Now improved. Patient is on Avapro, HCTZ. Continue beta blockers, change Lasix to by mouth today. #3 diabetes mellitus type 2: BG   Is elevated secondary to steroids. Continue metformin, had a suicide with coverage. #4 history of seizure disorder: Continue Keppra. #5 leukocytosis secondary to steroids: Monitor. Likely discharge tomorrow. Discussed with patient's registered nurse. Spoke with patient.    All the records are reviewed and case discussed with Care Management/Social Workerr. Management plans discussed with the patient, family and they are in agreement.  CODE STATUS:full  TOTAL TIME TAKING CARE OF THIS PATIENT: 35 minutes.   POSSIBLE D/C IN 1-2DAYS, DEPENDING ON CLINICAL CONDITION.   Epifanio Lesches M.D on 08/07/2016 at 10:33 AM  Between 7am to 6pm - Pager - [phone]  After 6pm go to [email or web address] - password [email or web address] [facility]  Office  [phone]  CC: Primary care physician; Glendon Axe, MD   Note: This dictation was prepared with Dragon dictation along with smaller phrase technology. Any transcriptional errors that result from this process are unintentional.

## 2016-08-08 MED ORDER — PREDNISONE 10 MG PO TABS: ORAL_TABLET | ORAL | 0 refills | Status: DC

## 2016-08-08 MED ORDER — PREDNISONE 20 MG PO TABS
20.0000 mg | ORAL_TABLET | Freq: Every day | ORAL | Status: DC
  Administered 2016-08-08: 20 mg via ORAL
  Filled 2016-08-08: qty 1

## 2016-08-08 MED ORDER — FUROSEMIDE 20 MG PO TABS
20.0000 mg | ORAL_TABLET | Freq: Two times a day (BID) | ORAL | 0 refills | Status: DC
Start: 2016-08-08 — End: 2020-06-13

## 2016-08-08 NOTE — Progress Notes (Signed)
Pt being discharged home, discharge instructions and prescriptions reviewed with pt, states understanding, pt with no complaints, no distress or discomfort noted 

## 2016-08-08 NOTE — Care Management (Signed)
Admitted to [facility] with the diagnosis of acute respiratory failure. Lives with friend, Elpidio Eric x 31 years [phone]), Changing primary care physicians from Dr. Glendon Axe to Dr. Silvio Clayman. Takes care of all basic and instrumental activities of daily living herself, drives. No falls. Great appetite. No home oxygen. No skilled facility. No Home Health. Prescriptions are filled at [facility] in [place]. Friend will transport Discharge to home today per Dr. Bridgett Larsson. Shelbie Ammons RN MSN CCM Care Management [phone]

## 2016-08-08 NOTE — Discharge Summary (Signed)
[facility] [place] NAME: Angelica Juarez    MR#:  [id number]  DATE OF BIRTH:  1941-10-02  DATE OF ADMISSION:  08/06/2016   ADMITTING PHYSICIAN: Henreitta Leber, MD  DATE OF DISCHARGE: 08/08/2016 12:39 PM  PRIMARY CARE PHYSICIAN: Singh,Jasmine, MD   ADMISSION DIAGNOSIS:  COPD exacerbation ([place]) 123456 Acute diastolic congestive heart failure ([place]) [I50.31] DISCHARGE DIAGNOSIS:  Active Problems:   Acute respiratory failure with hypoxia (HCC) Acute respiratory failure with hypoxia  COPD exacerbation mild acute (possible diastolic) CHF SECONDARY DIAGNOSIS:   Past Medical History:  Diagnosis Date  . Arthritis   . 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 Chronic kidney disease    chronic renal insufficiency  . Colon polyps 05/21/14  . COPD (chronic obstructive pulmonary disease) ([place])   . Diabetes mellitus without complication ([place])   . Fracture closed, humerus, shaft    right   . Hyperlipidemia 04/30/14  . Hypertension   . Impingement syndrome of right shoulder 12/29/15  . Pneumonia    hx  . Rotator cuff tear 04/21/16   right  . Seizures ([place])   . Sleep apnea    HOSPITAL COURSE:  Acute respiratory failure with hypoxia secondary to COPD exacerbation, mild CHF: improved with IV Lasix, IV Solu-Medrol, nebulizers.; off Oxygen.  #2 mild acute (possible diastolic) CHF: Now improved. Patient is on Avapro, HCTZ. Continue beta blockers, changed Lasix to by mouth..  #3 diabetes mellitus type 2: BG   Is elevated secondary to steroids. Continue metformin, had Sl.  #4 history of seizure disorder: Continue Keppra. #5 leukocytosis secondary to steroids.  DISCHARGE CONDITIONS:  Stable, discharged to home today. CONSULTS OBTAINED:   DRUG ALLERGIES:   Allergies  Allergen Reactions  . Contrast Media [Iodinated Diagnostic Agents] Anaphylaxis  . Latex Itching  . Phenobarbital Hives  . Tape Rash    silicones   DISCHARGE MEDICATIONS:       Medication List    TAKE these medications   albuterol 1.25 MG/3ML nebulizer solution Commonly known as:  ACCUNEB Take 1 ampule by nebulization every 6 (six) hours as needed. wheezing   ANORO ELLIPTA 62.5-25 MCG/INH Aepb Generic drug:  umeclidinium-vilanterol Inhale 1 puff into the lungs daily.   aspirin 81 MG tablet Take 81 mg by mouth daily.   atenolol 100 MG tablet Commonly known as:  TENORMIN Take 100 mg by mouth daily.   atorvastatin 40 MG tablet Commonly known as:  LIPITOR Take 40 mg by mouth daily.   Calcium Carbonate-Vitamin D 600-200 MG-UNIT Caps Take 1 capsule by mouth daily.   cloNIDine 0.2 MG tablet Commonly known as:  CATAPRES Take 0.2 mg by mouth 2 (two) times daily.   EPIPEN 2-PAK 0.3 mg/0.3 mL Soaj injection Generic drug:  EPINEPHrine 0.3 mg once. Reported on 05/25/2016   fluticasone 50 MCG/ACT nasal spray Commonly known as:  FLONASE Place 2 sprays into both nostrils daily.   furosemide 20 MG tablet Commonly known as:  LASIX Take 1 tablet (20 mg total) by mouth 2 (two) times daily.   hydrALAZINE 100 MG tablet Commonly known as:  APRESOLINE Take 100 mg by mouth 3 (three) times daily.   levETIRAcetam 750 MG tablet Commonly known as:  KEPPRA Take 750 mg by mouth 2 (two) times daily.   metFORMIN 500 MG tablet Commonly known as:  GLUCOPHAGE Take 500 mg by mouth 2 (two) times daily with a meal.   montelukast 10 MG tablet Commonly  known as:  SINGULAIR TAKE 1 TABLET BY MOUTH EVERY DAY   NIFEDICAL XL 30 MG 24 hr tablet Generic drug:  NIFEdipine Take 30 mg by mouth daily.   OVER THE COUNTER MEDICATION Place 1 drop into both eyes daily. Allergy eye drops   predniSONE 10 MG tablet Commonly known as:  DELTASONE 20 mg po daily for 2 days, 10 mg po daily for 2 days.   ROXICET 5-325 MG tablet Generic drug:  oxyCODONE-acetaminophen Take 1-2 tablets by mouth every 6 (six) hours as needed for severe pain.   traMADol 50 MG tablet Commonly  known as:  ULTRAM Take 50 mg by mouth every 8 (eight) hours as needed. pain   traZODone 50 MG tablet Commonly known as:  DESYREL Take 50-100 mg by mouth at bedtime as needed. Sleep   valsartan-hydrochlorothiazide 320-25 MG tablet Commonly known as:  DIOVAN-HCT Take 1 tablet by mouth daily.   VITAMIN B COMPLEX PO Take 1 tablet by mouth daily.   vitamin C 500 MG tablet Commonly known as:  ASCORBIC ACID Take 500 mg by mouth 2 (two) times daily.   WELCHOL 625 MG tablet Generic drug:  colesevelam Take 1,875 mg by mouth daily with supper.        DISCHARGE INSTRUCTIONS:   DIET:  Heart healthy and ADA diet DISCHARGE CONDITION:  Stable ACTIVITY:  As tolerated  If you experience worsening of your admission symptoms, develop shortness of breath, life threatening emergency, suicidal or homicidal thoughts you must seek medical attention immediately by calling 911 or calling your MD immediately  if symptoms less severe.  You Must read complete instructions/literature along with all the possible adverse reactions/side effects for all the Medicines you take and that have been prescribed to you. Take any new Medicines after you have completely understood and accpet all the possible adverse reactions/side effects.   Please note  You were cared for by a hospitalist during your hospital stay. If you have any questions about your discharge medications or the care you received while you were in the hospital after you are discharged, you can call the unit and asked to speak with the hospitalist on call if the hospitalist that took care of you is not available. Once you are discharged, your primary care physician will handle any further medical issues. Please note that NO REFILLS for any discharge medications will be authorized once you are discharged, as it is imperative that you return to your primary care physician (or establish a relationship with a primary care physician if you do not have one)  for your aftercare needs so that they can reassess your need for medications and monitor your lab values.    On the day of Discharge:  VITAL SIGNS:  Blood pressure (!) 156/59, pulse 63, temperature 97.5 F (36.4 C), temperature source Oral, resp. rate 18, height 5\' 7"  (1.702 m), weight 240 lb (108.9 kg), SpO2 94 %. PHYSICAL EXAMINATION:  GENERAL:  75 y.o.-year-old patient lying in the bed with no acute distress.  EYES: Pupils equal, round, reactive to light and accommodation. No scleral icterus. Extraocular muscles intact.  HEENT: Head atraumatic, normocephalic. Oropharynx and nasopharynx clear.  NECK:  Supple, no jugular venous distention. No thyroid enlargement, no tenderness.  LUNGS: Normal breath sounds bilaterally, no wheezing, rales,rhonchi or crepitation. No use of accessory muscles of respiration.  CARDIOVASCULAR: S1, S2 normal. No murmurs, rubs, or gallops.  ABDOMEN: Soft, non-tender, non-distended. Bowel sounds present. No organomegaly or mass.  EXTREMITIES: No pedal edema,  cyanosis, or clubbing.  NEUROLOGIC: Cranial nerves II through XII are intact. Muscle strength 5/5 in all extremities. Sensation intact. Gait not checked.  PSYCHIATRIC: The patient is alert and oriented x 3.  SKIN: No obvious rash, lesion, or ulcer.  DATA REVIEW:   CBC  Recent Labs Lab 08/07/16 0450  WBC 17.6*  HGB 13.6  HCT 38.6  PLT 402    Chemistries   Recent Labs Lab 08/06/16 1408 08/07/16 0450  NA 134* 134*  K 3.5 3.7  CL 99* 100*  CO2 26 26  GLUCOSE 155* 276*  BUN 10 20  CREATININE 0.74 0.85  CALCIUM 8.9 8.8*  AST 30  --   ALT 41  --   ALKPHOS 86  --   BILITOT 1.2  --      Microbiology Results  Results for orders placed or performed in visit on 09/16/12  Culture, blood (single)     Status: None   Collection Time: 09/16/12  5:38 PM  Result Value Ref Range Status   Micro Text Report   Final       COMMENT                   NO GROWTH AEROBICALLY/ANAEROBICALLY IN 5 DAYS    ANTIBIOTIC                                                      Culture, blood (single)     Status: None   Collection Time: 09/16/12  5:38 PM  Result Value Ref Range Status   Micro Text Report   Final       COMMENT                   NO GROWTH AEROBICALLY/ANAEROBICALLY IN 5 DAYS   ANTIBIOTIC                                                      Culture, expectorated sputum-assessment     Status: None   Collection Time: 09/17/12 11:09 AM  Result Value Ref Range Status   Micro Text Report   Final       COMMENT                   CONSISTENT WITH NORMAL FLORA   GRAM STAIN                MANY WHITE BLOOD CELLS   GRAM STAIN                EXCELLENT SPECIMEN-90-100% WBC   GRAM STAIN                FEW GRAM POSITIVE COCCI SEEN   ANTIBIOTIC                                                      Culture, expectorated sputum-assessment     Status: None   Collection Time: 09/20/12  9:55 PM  Result Value Ref Range Status   Micro Text  Report   Final       ORGANISM 1                LIGHT GROWTH STAPHYLOCOCCUS AUREUS   ORGANISM 2                LIGHT GROWTH KLEBSIELLA PNEUMONIAE SSP PNEUMONIAE   COMMENT                   -   COMMENT                   -   GRAM STAIN                FAIR SPECIMEN-70-80% WBC   GRAM STAIN                MODERATE WHITE BLOOD CELLS SEEN   GRAM STAIN                MODERATE GRAM POSITIVE COCCI SEEN. FEW YEAST SEEN.   GRAM STAIN                MODERATE GRAM POSITIVE ROD SEEN   ANTIBIOTIC                    ORG#1    ORG#2     CIPROFLOXACIN                 S        S         CLINDAMYCIN                   S                  ERYTHROMYCIN                  S                  GENTAMICIN                    S        S         LEVOFLOXACIN                  S        S         LINEZOLID                     S                  OXACILLIN                     S                  TIGECYCLINE                   S                  CEFOXITIN SCREEN              NEGATIVE            INDUCIBLE CLINDAMYCIN RESISTANNEGA TIVE           TRIMETHOPRIM/SULFAMETHOXAZOLE S        S         AMPICILLIN  R         CEFAZOLIN                              S         CEFEPIME                               S         CEFTAZIDIME                            S         CEFTRIAXONE                            S         IMIPENEM                               S         CEFOXITIN                              S             RADIOLOGY:  No results found.   Management plans discussed with the patient, family and they are in agreement.  CODE STATUS:  Code Status History    Date Active Date Inactive Code Status Order ID Comments User Context   08/06/2016  4:39 PM 08/08/2016  3:45 PM Full Code [id number]  Henreitta Leber, MD Inpatient    Advance Directive Documentation   Flowsheet Row Most Recent Value  Type of Advance Directive  Living will  Pre-existing out of facility DNR order (yellow form or pink MOST form)  No data  "MOST" Form in Place?  No data      TOTAL TIME TAKING CARE OF THIS PATIENT: 33 minutes.    Demetrios Loll M.D on 08/08/2016 at 5:13 PM  Between 7am to 6pm - Pager - [phone]  After 6pm go to [email or web address] - Proofreader  [facility]  Office  [phone]  CC: Primary care physician; Glendon Axe, MD   Note: This dictation was prepared with Dragon dictation along with smaller phrase technology. Any transcriptional errors that result from this process are unintentional.

## 2016-08-08 NOTE — Care Management Important Message (Signed)
Important Message  Patient Details  Name: Angelica Juarez MRN: [id number] Date of Birth: 1941/01/03   Medicare Important Message Given:  Yes    Shelbie Ammons, RN 08/08/2016, 9:41 AM

## 2016-08-08 NOTE — Discharge Instructions (Signed)
Heart healthy diet. Activity as tolerated. Continue CPAP at night.

## 2016-08-09 ENCOUNTER — Other Ambulatory Visit: Payer: Self-pay | Admitting: Unknown Physician Specialty

## 2016-08-09 ENCOUNTER — Telehealth: Payer: Self-pay

## 2016-08-09 NOTE — Telephone Encounter (Signed)
Courtesy call back completed today for patient's recent visit at [facility]. Patient did not answer, left message on machine to call back with any questions or concerns. Patient had recent admission and discharge to [facility].

## 2016-08-15 DIAGNOSIS — R809 Proteinuria, unspecified: Secondary | ICD-10-CM | POA: Diagnosis not present

## 2016-08-15 DIAGNOSIS — M25511 Pain in right shoulder: Secondary | ICD-10-CM | POA: Diagnosis not present

## 2016-08-15 DIAGNOSIS — I10 Essential (primary) hypertension: Secondary | ICD-10-CM | POA: Diagnosis not present

## 2016-08-15 DIAGNOSIS — E1121 Type 2 diabetes mellitus with diabetic nephropathy: Secondary | ICD-10-CM | POA: Diagnosis not present

## 2016-08-16 DIAGNOSIS — M25511 Pain in right shoulder: Secondary | ICD-10-CM | POA: Diagnosis not present

## 2016-08-17 ENCOUNTER — Ambulatory Visit
Admission: RE | Admit: 2016-08-17 | Discharge: 2016-08-17 | Disposition: A | Discharge status: Home / Self Care | Payer: Medicare Other | Source: Ambulatory Visit | Attending: Physician Assistant | Admitting: Physician Assistant

## 2016-08-17 ENCOUNTER — Other Ambulatory Visit: Payer: Self-pay | Admitting: Physician Assistant

## 2016-08-17 DIAGNOSIS — I517 Cardiomegaly: Secondary | ICD-10-CM | POA: Insufficient documentation

## 2016-08-17 DIAGNOSIS — R062 Wheezing: Secondary | ICD-10-CM | POA: Insufficient documentation

## 2016-08-17 DIAGNOSIS — J441 Chronic obstructive pulmonary disease with (acute) exacerbation: Secondary | ICD-10-CM | POA: Diagnosis not present

## 2016-08-17 DIAGNOSIS — J301 Allergic rhinitis due to pollen: Secondary | ICD-10-CM | POA: Diagnosis not present

## 2016-08-17 DIAGNOSIS — G4733 Obstructive sleep apnea (adult) (pediatric): Secondary | ICD-10-CM | POA: Diagnosis not present

## 2016-08-17 DIAGNOSIS — Z23 Encounter for immunization: Secondary | ICD-10-CM | POA: Diagnosis not present

## 2016-08-25 DIAGNOSIS — G4733 Obstructive sleep apnea (adult) (pediatric): Secondary | ICD-10-CM | POA: Diagnosis not present

## 2016-08-25 DIAGNOSIS — Z9889 Other specified postprocedural states: Secondary | ICD-10-CM | POA: Insufficient documentation

## 2016-08-25 DIAGNOSIS — J301 Allergic rhinitis due to pollen: Secondary | ICD-10-CM | POA: Diagnosis not present

## 2016-08-25 DIAGNOSIS — Z23 Encounter for immunization: Secondary | ICD-10-CM | POA: Diagnosis not present

## 2016-08-25 DIAGNOSIS — J449 Chronic obstructive pulmonary disease, unspecified: Secondary | ICD-10-CM | POA: Diagnosis not present

## 2016-08-31 DIAGNOSIS — I10 Essential (primary) hypertension: Secondary | ICD-10-CM | POA: Diagnosis not present

## 2016-08-31 DIAGNOSIS — E1121 Type 2 diabetes mellitus with diabetic nephropathy: Secondary | ICD-10-CM | POA: Diagnosis not present

## 2016-08-31 DIAGNOSIS — J301 Allergic rhinitis due to pollen: Secondary | ICD-10-CM | POA: Diagnosis not present

## 2016-09-06 DIAGNOSIS — M25511 Pain in right shoulder: Secondary | ICD-10-CM | POA: Diagnosis not present

## 2016-09-07 ENCOUNTER — Encounter (INDEPENDENT_AMBULATORY_CARE_PROVIDER_SITE_OTHER): Payer: Medicare Other

## 2016-09-07 ENCOUNTER — Ambulatory Visit (INDEPENDENT_AMBULATORY_CARE_PROVIDER_SITE_OTHER): Payer: Medicare Other | Admitting: Vascular Surgery

## 2016-09-08 DIAGNOSIS — M25511 Pain in right shoulder: Secondary | ICD-10-CM | POA: Diagnosis not present

## 2016-09-13 DIAGNOSIS — M25511 Pain in right shoulder: Secondary | ICD-10-CM | POA: Diagnosis not present

## 2016-09-14 DIAGNOSIS — J301 Allergic rhinitis due to pollen: Secondary | ICD-10-CM | POA: Diagnosis not present

## 2016-09-15 DIAGNOSIS — M25511 Pain in right shoulder: Secondary | ICD-10-CM | POA: Diagnosis not present

## 2016-09-20 DIAGNOSIS — M25511 Pain in right shoulder: Secondary | ICD-10-CM | POA: Diagnosis not present

## 2016-09-27 DIAGNOSIS — J301 Allergic rhinitis due to pollen: Secondary | ICD-10-CM | POA: Diagnosis not present

## 2016-09-28 DIAGNOSIS — J301 Allergic rhinitis due to pollen: Secondary | ICD-10-CM | POA: Diagnosis not present

## 2016-10-12 DIAGNOSIS — J0140 Acute pansinusitis, unspecified: Secondary | ICD-10-CM | POA: Diagnosis not present

## 2016-10-12 DIAGNOSIS — I10 Essential (primary) hypertension: Secondary | ICD-10-CM | POA: Diagnosis not present

## 2016-10-12 DIAGNOSIS — G4733 Obstructive sleep apnea (adult) (pediatric): Secondary | ICD-10-CM | POA: Diagnosis not present

## 2016-10-12 DIAGNOSIS — J301 Allergic rhinitis due to pollen: Secondary | ICD-10-CM | POA: Diagnosis not present

## 2016-10-12 DIAGNOSIS — E7800 Pure hypercholesterolemia, unspecified: Secondary | ICD-10-CM | POA: Diagnosis not present

## 2016-10-12 DIAGNOSIS — R0602 Shortness of breath: Secondary | ICD-10-CM | POA: Diagnosis not present

## 2016-10-12 DIAGNOSIS — Z9989 Dependence on other enabling machines and devices: Secondary | ICD-10-CM | POA: Diagnosis not present

## 2016-10-18 DIAGNOSIS — J0140 Acute pansinusitis, unspecified: Secondary | ICD-10-CM | POA: Diagnosis not present

## 2016-10-18 DIAGNOSIS — J301 Allergic rhinitis due to pollen: Secondary | ICD-10-CM | POA: Diagnosis not present

## 2016-10-18 DIAGNOSIS — G4733 Obstructive sleep apnea (adult) (pediatric): Secondary | ICD-10-CM | POA: Diagnosis not present

## 2016-10-18 DIAGNOSIS — J449 Chronic obstructive pulmonary disease, unspecified: Secondary | ICD-10-CM | POA: Diagnosis not present

## 2016-11-02 DIAGNOSIS — J301 Allergic rhinitis due to pollen: Secondary | ICD-10-CM | POA: Diagnosis not present

## 2016-11-16 DIAGNOSIS — R809 Proteinuria, unspecified: Secondary | ICD-10-CM | POA: Diagnosis not present

## 2016-11-16 DIAGNOSIS — J301 Allergic rhinitis due to pollen: Secondary | ICD-10-CM | POA: Diagnosis not present

## 2016-11-16 DIAGNOSIS — E1129 Type 2 diabetes mellitus with other diabetic kidney complication: Secondary | ICD-10-CM | POA: Diagnosis not present

## 2016-11-16 DIAGNOSIS — E7800 Pure hypercholesterolemia, unspecified: Secondary | ICD-10-CM | POA: Diagnosis not present

## 2016-11-21 ENCOUNTER — Other Ambulatory Visit: Payer: Self-pay | Admitting: Family Medicine

## 2016-11-21 DIAGNOSIS — E1165 Type 2 diabetes mellitus with hyperglycemia: Secondary | ICD-10-CM | POA: Diagnosis not present

## 2016-11-21 DIAGNOSIS — E1129 Type 2 diabetes mellitus with other diabetic kidney complication: Secondary | ICD-10-CM | POA: Diagnosis not present

## 2016-11-21 DIAGNOSIS — Z79891 Long term (current) use of opiate analgesic: Secondary | ICD-10-CM | POA: Diagnosis not present

## 2016-11-21 DIAGNOSIS — M129 Arthropathy, unspecified: Secondary | ICD-10-CM | POA: Diagnosis not present

## 2016-11-21 DIAGNOSIS — I10 Essential (primary) hypertension: Secondary | ICD-10-CM | POA: Diagnosis not present

## 2016-11-21 DIAGNOSIS — E7800 Pure hypercholesterolemia, unspecified: Secondary | ICD-10-CM | POA: Diagnosis not present

## 2016-11-21 DIAGNOSIS — Z9989 Dependence on other enabling machines and devices: Secondary | ICD-10-CM | POA: Diagnosis not present

## 2016-11-21 DIAGNOSIS — J449 Chronic obstructive pulmonary disease, unspecified: Secondary | ICD-10-CM | POA: Diagnosis not present

## 2016-11-21 DIAGNOSIS — G4733 Obstructive sleep apnea (adult) (pediatric): Secondary | ICD-10-CM | POA: Diagnosis not present

## 2016-11-21 DIAGNOSIS — I5032 Chronic diastolic (congestive) heart failure: Secondary | ICD-10-CM | POA: Diagnosis not present

## 2016-11-21 DIAGNOSIS — G40909 Epilepsy, unspecified, not intractable, without status epilepticus: Secondary | ICD-10-CM | POA: Diagnosis not present

## 2016-11-21 DIAGNOSIS — J3089 Other allergic rhinitis: Secondary | ICD-10-CM | POA: Diagnosis not present

## 2016-11-21 DIAGNOSIS — Z1231 Encounter for screening mammogram for malignant neoplasm of breast: Secondary | ICD-10-CM

## 2016-11-29 ENCOUNTER — Ambulatory Visit
Admission: RE | Admit: 2016-11-29 | Discharge: 2016-11-29 | Disposition: A | Discharge status: Home / Self Care | Payer: Medicare Other | Source: Ambulatory Visit | Attending: Family Medicine | Admitting: Family Medicine

## 2016-11-29 DIAGNOSIS — Z1231 Encounter for screening mammogram for malignant neoplasm of breast: Secondary | ICD-10-CM | POA: Diagnosis not present

## 2016-11-29 DIAGNOSIS — J301 Allergic rhinitis due to pollen: Secondary | ICD-10-CM | POA: Diagnosis not present

## 2016-11-29 HISTORY — DX: Personal history of irradiation: Z92.3

## 2016-12-05 DIAGNOSIS — E1121 Type 2 diabetes mellitus with diabetic nephropathy: Secondary | ICD-10-CM | POA: Diagnosis not present

## 2016-12-05 DIAGNOSIS — R809 Proteinuria, unspecified: Secondary | ICD-10-CM | POA: Diagnosis not present

## 2016-12-05 DIAGNOSIS — I10 Essential (primary) hypertension: Secondary | ICD-10-CM | POA: Diagnosis not present

## 2016-12-15 DIAGNOSIS — Z9889 Other specified postprocedural states: Secondary | ICD-10-CM | POA: Diagnosis not present

## 2016-12-15 DIAGNOSIS — M7501 Adhesive capsulitis of right shoulder: Secondary | ICD-10-CM | POA: Diagnosis not present

## 2016-12-15 DIAGNOSIS — J301 Allergic rhinitis due to pollen: Secondary | ICD-10-CM | POA: Diagnosis not present

## 2016-12-15 DIAGNOSIS — J449 Chronic obstructive pulmonary disease, unspecified: Secondary | ICD-10-CM | POA: Diagnosis not present

## 2016-12-15 DIAGNOSIS — G4733 Obstructive sleep apnea (adult) (pediatric): Secondary | ICD-10-CM | POA: Diagnosis not present

## 2016-12-20 DIAGNOSIS — E119 Type 2 diabetes mellitus without complications: Secondary | ICD-10-CM | POA: Diagnosis not present

## 2016-12-27 DIAGNOSIS — J301 Allergic rhinitis due to pollen: Secondary | ICD-10-CM | POA: Diagnosis not present

## 2017-01-12 DIAGNOSIS — J301 Allergic rhinitis due to pollen: Secondary | ICD-10-CM | POA: Diagnosis not present

## 2017-01-30 DIAGNOSIS — J301 Allergic rhinitis due to pollen: Secondary | ICD-10-CM | POA: Diagnosis not present

## 2017-02-09 DIAGNOSIS — J301 Allergic rhinitis due to pollen: Secondary | ICD-10-CM | POA: Diagnosis not present

## 2017-02-13 DIAGNOSIS — E7800 Pure hypercholesterolemia, unspecified: Secondary | ICD-10-CM | POA: Diagnosis not present

## 2017-02-13 DIAGNOSIS — R809 Proteinuria, unspecified: Secondary | ICD-10-CM | POA: Diagnosis not present

## 2017-02-13 DIAGNOSIS — E1129 Type 2 diabetes mellitus with other diabetic kidney complication: Secondary | ICD-10-CM | POA: Diagnosis not present

## 2017-02-13 DIAGNOSIS — E1165 Type 2 diabetes mellitus with hyperglycemia: Secondary | ICD-10-CM | POA: Diagnosis not present

## 2017-02-21 DIAGNOSIS — I10 Essential (primary) hypertension: Secondary | ICD-10-CM | POA: Diagnosis not present

## 2017-02-21 DIAGNOSIS — R809 Proteinuria, unspecified: Secondary | ICD-10-CM | POA: Diagnosis not present

## 2017-02-21 DIAGNOSIS — Z9989 Dependence on other enabling machines and devices: Secondary | ICD-10-CM | POA: Diagnosis not present

## 2017-02-21 DIAGNOSIS — J449 Chronic obstructive pulmonary disease, unspecified: Secondary | ICD-10-CM | POA: Diagnosis not present

## 2017-02-21 DIAGNOSIS — E7800 Pure hypercholesterolemia, unspecified: Secondary | ICD-10-CM | POA: Diagnosis not present

## 2017-02-21 DIAGNOSIS — E1129 Type 2 diabetes mellitus with other diabetic kidney complication: Secondary | ICD-10-CM | POA: Diagnosis not present

## 2017-02-21 DIAGNOSIS — M129 Arthropathy, unspecified: Secondary | ICD-10-CM | POA: Diagnosis not present

## 2017-02-21 DIAGNOSIS — J3089 Other allergic rhinitis: Secondary | ICD-10-CM | POA: Diagnosis not present

## 2017-02-21 DIAGNOSIS — J301 Allergic rhinitis due to pollen: Secondary | ICD-10-CM | POA: Diagnosis not present

## 2017-02-21 DIAGNOSIS — G40909 Epilepsy, unspecified, not intractable, without status epilepticus: Secondary | ICD-10-CM | POA: Diagnosis not present

## 2017-02-21 DIAGNOSIS — G4733 Obstructive sleep apnea (adult) (pediatric): Secondary | ICD-10-CM | POA: Diagnosis not present

## 2017-02-21 DIAGNOSIS — I5032 Chronic diastolic (congestive) heart failure: Secondary | ICD-10-CM | POA: Diagnosis not present

## 2017-02-23 DIAGNOSIS — J301 Allergic rhinitis due to pollen: Secondary | ICD-10-CM | POA: Diagnosis not present

## 2017-03-09 DIAGNOSIS — J301 Allergic rhinitis due to pollen: Secondary | ICD-10-CM | POA: Diagnosis not present

## 2017-03-23 DIAGNOSIS — J301 Allergic rhinitis due to pollen: Secondary | ICD-10-CM | POA: Diagnosis not present

## 2017-04-03 DIAGNOSIS — I10 Essential (primary) hypertension: Secondary | ICD-10-CM | POA: Diagnosis not present

## 2017-04-03 DIAGNOSIS — E1121 Type 2 diabetes mellitus with diabetic nephropathy: Secondary | ICD-10-CM | POA: Diagnosis not present

## 2017-04-03 DIAGNOSIS — R809 Proteinuria, unspecified: Secondary | ICD-10-CM | POA: Diagnosis not present

## 2017-04-05 DIAGNOSIS — J301 Allergic rhinitis due to pollen: Secondary | ICD-10-CM | POA: Diagnosis not present

## 2017-04-06 DIAGNOSIS — N189 Chronic kidney disease, unspecified: Secondary | ICD-10-CM | POA: Diagnosis not present

## 2017-04-06 DIAGNOSIS — Z9989 Dependence on other enabling machines and devices: Secondary | ICD-10-CM | POA: Diagnosis not present

## 2017-04-06 DIAGNOSIS — I10 Essential (primary) hypertension: Secondary | ICD-10-CM | POA: Diagnosis not present

## 2017-04-06 DIAGNOSIS — E7800 Pure hypercholesterolemia, unspecified: Secondary | ICD-10-CM | POA: Diagnosis not present

## 2017-04-06 DIAGNOSIS — R0789 Other chest pain: Secondary | ICD-10-CM | POA: Diagnosis not present

## 2017-04-06 DIAGNOSIS — G4733 Obstructive sleep apnea (adult) (pediatric): Secondary | ICD-10-CM | POA: Diagnosis not present

## 2017-04-20 DIAGNOSIS — J301 Allergic rhinitis due to pollen: Secondary | ICD-10-CM | POA: Diagnosis not present

## 2017-05-04 DIAGNOSIS — J301 Allergic rhinitis due to pollen: Secondary | ICD-10-CM | POA: Diagnosis not present

## 2017-05-18 DIAGNOSIS — R809 Proteinuria, unspecified: Secondary | ICD-10-CM | POA: Diagnosis not present

## 2017-05-18 DIAGNOSIS — J301 Allergic rhinitis due to pollen: Secondary | ICD-10-CM | POA: Diagnosis not present

## 2017-05-18 DIAGNOSIS — E1129 Type 2 diabetes mellitus with other diabetic kidney complication: Secondary | ICD-10-CM | POA: Diagnosis not present

## 2017-05-18 DIAGNOSIS — E7800 Pure hypercholesterolemia, unspecified: Secondary | ICD-10-CM | POA: Diagnosis not present

## 2017-05-25 DIAGNOSIS — I5032 Chronic diastolic (congestive) heart failure: Secondary | ICD-10-CM | POA: Diagnosis not present

## 2017-05-25 DIAGNOSIS — G40909 Epilepsy, unspecified, not intractable, without status epilepticus: Secondary | ICD-10-CM | POA: Diagnosis not present

## 2017-05-25 DIAGNOSIS — J3089 Other allergic rhinitis: Secondary | ICD-10-CM | POA: Diagnosis not present

## 2017-05-25 DIAGNOSIS — E7800 Pure hypercholesterolemia, unspecified: Secondary | ICD-10-CM | POA: Diagnosis not present

## 2017-05-25 DIAGNOSIS — J449 Chronic obstructive pulmonary disease, unspecified: Secondary | ICD-10-CM | POA: Diagnosis not present

## 2017-05-25 DIAGNOSIS — M129 Arthropathy, unspecified: Secondary | ICD-10-CM | POA: Diagnosis not present

## 2017-05-25 DIAGNOSIS — I10 Essential (primary) hypertension: Secondary | ICD-10-CM | POA: Diagnosis not present

## 2017-05-25 DIAGNOSIS — Z9989 Dependence on other enabling machines and devices: Secondary | ICD-10-CM | POA: Diagnosis not present

## 2017-05-25 DIAGNOSIS — G4733 Obstructive sleep apnea (adult) (pediatric): Secondary | ICD-10-CM | POA: Diagnosis not present

## 2017-05-25 DIAGNOSIS — E1129 Type 2 diabetes mellitus with other diabetic kidney complication: Secondary | ICD-10-CM | POA: Diagnosis not present

## 2017-05-25 DIAGNOSIS — R809 Proteinuria, unspecified: Secondary | ICD-10-CM | POA: Diagnosis not present

## 2017-06-01 DIAGNOSIS — J301 Allergic rhinitis due to pollen: Secondary | ICD-10-CM | POA: Diagnosis not present

## 2017-06-02 ENCOUNTER — Other Ambulatory Visit: Payer: Self-pay

## 2017-06-15 DIAGNOSIS — R0602 Shortness of breath: Secondary | ICD-10-CM | POA: Diagnosis not present

## 2017-06-15 DIAGNOSIS — G4733 Obstructive sleep apnea (adult) (pediatric): Secondary | ICD-10-CM | POA: Diagnosis not present

## 2017-06-15 DIAGNOSIS — J301 Allergic rhinitis due to pollen: Secondary | ICD-10-CM | POA: Diagnosis not present

## 2017-06-15 DIAGNOSIS — J449 Chronic obstructive pulmonary disease, unspecified: Secondary | ICD-10-CM | POA: Diagnosis not present

## 2017-06-22 DIAGNOSIS — J301 Allergic rhinitis due to pollen: Secondary | ICD-10-CM | POA: Diagnosis not present

## 2017-06-26 DIAGNOSIS — J301 Allergic rhinitis due to pollen: Secondary | ICD-10-CM | POA: Diagnosis not present

## 2017-07-06 DIAGNOSIS — J301 Allergic rhinitis due to pollen: Secondary | ICD-10-CM | POA: Diagnosis not present

## 2017-07-13 DIAGNOSIS — R569 Unspecified convulsions: Secondary | ICD-10-CM | POA: Diagnosis not present

## 2017-07-20 DIAGNOSIS — J301 Allergic rhinitis due to pollen: Secondary | ICD-10-CM | POA: Diagnosis not present

## 2017-07-26 DIAGNOSIS — N182 Chronic kidney disease, stage 2 (mild): Secondary | ICD-10-CM | POA: Diagnosis not present

## 2017-07-26 DIAGNOSIS — R809 Proteinuria, unspecified: Secondary | ICD-10-CM | POA: Diagnosis not present

## 2017-07-26 DIAGNOSIS — I10 Essential (primary) hypertension: Secondary | ICD-10-CM | POA: Diagnosis not present

## 2017-07-26 DIAGNOSIS — E1121 Type 2 diabetes mellitus with diabetic nephropathy: Secondary | ICD-10-CM | POA: Diagnosis not present

## 2017-08-03 DIAGNOSIS — J301 Allergic rhinitis due to pollen: Secondary | ICD-10-CM | POA: Diagnosis not present

## 2017-08-07 DIAGNOSIS — Z23 Encounter for immunization: Secondary | ICD-10-CM | POA: Diagnosis not present

## 2017-08-17 DIAGNOSIS — J301 Allergic rhinitis due to pollen: Secondary | ICD-10-CM | POA: Diagnosis not present

## 2017-08-18 DIAGNOSIS — H353221 Exudative age-related macular degeneration, left eye, with active choroidal neovascularization: Secondary | ICD-10-CM | POA: Diagnosis not present

## 2017-08-22 DIAGNOSIS — H353221 Exudative age-related macular degeneration, left eye, with active choroidal neovascularization: Secondary | ICD-10-CM | POA: Diagnosis not present

## 2017-08-24 DIAGNOSIS — R569 Unspecified convulsions: Secondary | ICD-10-CM | POA: Diagnosis not present

## 2017-08-28 DIAGNOSIS — E7800 Pure hypercholesterolemia, unspecified: Secondary | ICD-10-CM | POA: Diagnosis not present

## 2017-08-28 DIAGNOSIS — E1129 Type 2 diabetes mellitus with other diabetic kidney complication: Secondary | ICD-10-CM | POA: Diagnosis not present

## 2017-08-28 DIAGNOSIS — M129 Arthropathy, unspecified: Secondary | ICD-10-CM | POA: Diagnosis not present

## 2017-08-28 DIAGNOSIS — Z9989 Dependence on other enabling machines and devices: Secondary | ICD-10-CM | POA: Diagnosis not present

## 2017-08-28 DIAGNOSIS — G40909 Epilepsy, unspecified, not intractable, without status epilepticus: Secondary | ICD-10-CM | POA: Diagnosis not present

## 2017-08-28 DIAGNOSIS — J449 Chronic obstructive pulmonary disease, unspecified: Secondary | ICD-10-CM | POA: Diagnosis not present

## 2017-08-28 DIAGNOSIS — J3089 Other allergic rhinitis: Secondary | ICD-10-CM | POA: Diagnosis not present

## 2017-08-28 DIAGNOSIS — R809 Proteinuria, unspecified: Secondary | ICD-10-CM | POA: Diagnosis not present

## 2017-08-28 DIAGNOSIS — I10 Essential (primary) hypertension: Secondary | ICD-10-CM | POA: Diagnosis not present

## 2017-08-28 DIAGNOSIS — I5032 Chronic diastolic (congestive) heart failure: Secondary | ICD-10-CM | POA: Diagnosis not present

## 2017-08-28 DIAGNOSIS — G4733 Obstructive sleep apnea (adult) (pediatric): Secondary | ICD-10-CM | POA: Diagnosis not present

## 2017-08-31 DIAGNOSIS — J301 Allergic rhinitis due to pollen: Secondary | ICD-10-CM | POA: Diagnosis not present

## 2017-09-04 DIAGNOSIS — E1129 Type 2 diabetes mellitus with other diabetic kidney complication: Secondary | ICD-10-CM | POA: Diagnosis not present

## 2017-09-04 DIAGNOSIS — E7800 Pure hypercholesterolemia, unspecified: Secondary | ICD-10-CM | POA: Diagnosis not present

## 2017-09-04 DIAGNOSIS — R809 Proteinuria, unspecified: Secondary | ICD-10-CM | POA: Diagnosis not present

## 2017-09-14 DIAGNOSIS — J301 Allergic rhinitis due to pollen: Secondary | ICD-10-CM | POA: Diagnosis not present

## 2017-09-26 DIAGNOSIS — H47322 Drusen of optic disc, left eye: Secondary | ICD-10-CM | POA: Diagnosis not present

## 2017-09-26 DIAGNOSIS — H353112 Nonexudative age-related macular degeneration, right eye, intermediate dry stage: Secondary | ICD-10-CM | POA: Diagnosis not present

## 2017-09-26 DIAGNOSIS — H353221 Exudative age-related macular degeneration, left eye, with active choroidal neovascularization: Secondary | ICD-10-CM | POA: Diagnosis not present

## 2017-09-28 DIAGNOSIS — J301 Allergic rhinitis due to pollen: Secondary | ICD-10-CM | POA: Diagnosis not present

## 2017-09-28 DIAGNOSIS — H26491 Other secondary cataract, right eye: Secondary | ICD-10-CM | POA: Diagnosis not present

## 2017-10-03 DIAGNOSIS — G4733 Obstructive sleep apnea (adult) (pediatric): Secondary | ICD-10-CM | POA: Diagnosis not present

## 2017-10-03 DIAGNOSIS — J301 Allergic rhinitis due to pollen: Secondary | ICD-10-CM | POA: Diagnosis not present

## 2017-10-03 DIAGNOSIS — Z9989 Dependence on other enabling machines and devices: Secondary | ICD-10-CM | POA: Diagnosis not present

## 2017-10-03 DIAGNOSIS — I10 Essential (primary) hypertension: Secondary | ICD-10-CM | POA: Diagnosis not present

## 2017-10-03 DIAGNOSIS — J449 Chronic obstructive pulmonary disease, unspecified: Secondary | ICD-10-CM | POA: Diagnosis not present

## 2017-10-03 DIAGNOSIS — E7800 Pure hypercholesterolemia, unspecified: Secondary | ICD-10-CM | POA: Diagnosis not present

## 2017-10-12 DIAGNOSIS — J301 Allergic rhinitis due to pollen: Secondary | ICD-10-CM | POA: Diagnosis not present

## 2017-10-31 ENCOUNTER — Ambulatory Visit: Payer: Self-pay

## 2017-10-31 DIAGNOSIS — H353221 Exudative age-related macular degeneration, left eye, with active choroidal neovascularization: Secondary | ICD-10-CM | POA: Diagnosis not present

## 2017-11-01 ENCOUNTER — Ambulatory Visit: Payer: Self-pay

## 2017-11-02 ENCOUNTER — Ambulatory Visit (INDEPENDENT_AMBULATORY_CARE_PROVIDER_SITE_OTHER): Payer: Medicare Other

## 2017-11-02 DIAGNOSIS — J309 Allergic rhinitis, unspecified: Secondary | ICD-10-CM

## 2017-11-09 ENCOUNTER — Ambulatory Visit: Payer: Self-pay

## 2017-11-16 ENCOUNTER — Ambulatory Visit (INDEPENDENT_AMBULATORY_CARE_PROVIDER_SITE_OTHER): Payer: Medicare Other

## 2017-11-16 DIAGNOSIS — J301 Allergic rhinitis due to pollen: Secondary | ICD-10-CM | POA: Diagnosis not present

## 2017-11-30 ENCOUNTER — Ambulatory Visit: Payer: Self-pay

## 2017-12-04 ENCOUNTER — Ambulatory Visit (INDEPENDENT_AMBULATORY_CARE_PROVIDER_SITE_OTHER): Payer: Medicare Other

## 2017-12-04 DIAGNOSIS — J309 Allergic rhinitis, unspecified: Secondary | ICD-10-CM

## 2017-12-04 DIAGNOSIS — H353221 Exudative age-related macular degeneration, left eye, with active choroidal neovascularization: Secondary | ICD-10-CM | POA: Diagnosis not present

## 2017-12-08 DIAGNOSIS — J301 Allergic rhinitis due to pollen: Secondary | ICD-10-CM | POA: Diagnosis not present

## 2017-12-14 ENCOUNTER — Ambulatory Visit: Payer: Self-pay | Admitting: Internal Medicine

## 2017-12-18 ENCOUNTER — Ambulatory Visit (INDEPENDENT_AMBULATORY_CARE_PROVIDER_SITE_OTHER): Payer: Medicare Other

## 2017-12-18 DIAGNOSIS — J309 Allergic rhinitis, unspecified: Secondary | ICD-10-CM | POA: Diagnosis not present

## 2018-01-01 ENCOUNTER — Ambulatory Visit (INDEPENDENT_AMBULATORY_CARE_PROVIDER_SITE_OTHER): Payer: Medicare Other | Admitting: Internal Medicine

## 2018-01-01 ENCOUNTER — Ambulatory Visit (INDEPENDENT_AMBULATORY_CARE_PROVIDER_SITE_OTHER): Payer: Medicare Other

## 2018-01-01 ENCOUNTER — Encounter: Payer: Self-pay | Admitting: Internal Medicine

## 2018-01-01 ENCOUNTER — Ambulatory Visit: Payer: Self-pay

## 2018-01-01 VITALS — BP 160/76 | HR 79 | Resp 16 | Ht 67.5 in | Wt 227.4 lb

## 2018-01-01 DIAGNOSIS — G4733 Obstructive sleep apnea (adult) (pediatric): Secondary | ICD-10-CM | POA: Diagnosis not present

## 2018-01-01 DIAGNOSIS — Z9989 Dependence on other enabling machines and devices: Secondary | ICD-10-CM | POA: Diagnosis not present

## 2018-01-01 DIAGNOSIS — J449 Chronic obstructive pulmonary disease, unspecified: Secondary | ICD-10-CM | POA: Diagnosis not present

## 2018-01-01 DIAGNOSIS — J301 Allergic rhinitis due to pollen: Secondary | ICD-10-CM

## 2018-01-01 NOTE — Patient Instructions (Signed)

## 2018-01-01 NOTE — Progress Notes (Signed)
[facility] [place], [place] [address]  Pulmonary Sleep Medicine  Office Visit Note  Patient Name: Angelica Juarez DOB: [birth year] MRN [id number]  Date of Service: 01/01/2018  Complaints/HPI: She has noted more SOB of late. She states that she has needed to use her nebs more. She also notes that she has been stressed because of her husband being in the hospital. Patient states that she had also gotten the flu in December. Has a little cough noted and she has noted some allergy symptoms. She is doing well with the allergy shots. Now should be able to go down on her frequency to monthly  ROS  General: (-) fever, (-) chills, (-) night sweats, (-) weakness Skin: (-) rashes, (-) itching,. Eyes: (-) visual changes, (-) redness, (-) itching. Nose and Sinuses: (-) nasal stuffiness or itchiness, (-) postnasal drip, (-) nosebleeds, (-) sinus trouble. Mouth and Throat: (-) sore throat, (-) hoarseness. Neck: (-) swollen glands, (-) enlarged thyroid, (-) neck pain. Respiratory: + cough, (-) bloody sputum, + shortness of breath, - wheezing. Cardiovascular: - ankle swelling, (-) chest pain. Lymphatic: (-) lymph node enlargement. Neurologic: (-) numbness, (-) tingling. Psychiatric: (-) anxiety, (-) depression   Current Medication: Outpatient Encounter Medications as of 01/01/2018  Medication Sig  . albuterol (ACCUNEB) 1.25 MG/3ML nebulizer solution Take 1 ampule by nebulization every 6 (six) hours as needed. wheezing  . aspirin 81 MG tablet Take 81 mg by mouth daily.  Marland Kitchen atenolol (TENORMIN) 100 MG tablet Take 100 mg by mouth daily.  Marland Kitchen atorvastatin (LIPITOR) 40 MG tablet Take 40 mg by mouth daily.  . B Complex Vitamins (VITAMIN B COMPLEX PO) Take 1 tablet by mouth daily.  . Calcium Carbonate-Vitamin D 600-200 MG-UNIT CAPS Take 1 capsule by mouth daily.  . cloNIDine (CATAPRES) 0.2 MG tablet Take 0.2 mg by mouth 2 (two) times daily.  Marland Kitchen EPINEPHrine (EPIPEN 2-PAK) 0.3 mg/0.3 mL  IJ SOAJ injection 0.3 mg once. Reported on 05/25/2016  . fluticasone (FLONASE) 50 MCG/ACT nasal spray Place 2 sprays into both nostrils daily.  . furosemide (LASIX) 20 MG tablet Take 1 tablet (20 mg total) by mouth 2 (two) times daily.  . hydrALAZINE (APRESOLINE) 100 MG tablet Take 100 mg by mouth 3 (three) times daily.  Marland Kitchen levETIRAcetam (KEPPRA) 750 MG tablet Take 750 mg by mouth 2 (two) times daily.  . metFORMIN (GLUCOPHAGE) 500 MG tablet Take 500 mg by mouth 2 (two) times daily with a meal.  . montelukast (SINGULAIR) 10 MG tablet TAKE 1 TABLET BY MOUTH EVERY DAY  . NIFEdipine (NIFEDICAL XL) 30 MG 24 hr tablet Take 30 mg by mouth daily.  Marland Kitchen OVER THE COUNTER MEDICATION Place 1 drop into both eyes daily. Allergy eye drops  . predniSONE (DELTASONE) 10 MG tablet 20 mg po daily for 2 days, 10 mg po daily for 2 days.  Marland Kitchen ROXICET 5-325 MG tablet Take 1-2 tablets by mouth every 6 (six) hours as needed for severe pain. (Patient not taking: Reported on 08/06/2016)  . traMADol (ULTRAM) 50 MG tablet Take 50 mg by mouth every 8 (eight) hours as needed. pain  . traZODone (DESYREL) 50 MG tablet Take 50-100 mg by mouth at bedtime as needed. Sleep  . umeclidinium-vilanterol (ANORO ELLIPTA) 62.5-25 MCG/INH AEPB Inhale 1 puff into the lungs daily.  . valsartan-hydrochlorothiazide (DIOVAN-HCT) 320-25 MG tablet Take 1 tablet by mouth daily.  . vitamin C (ASCORBIC ACID) 500 MG tablet Take 500 mg by mouth 2 (two) times daily.  Marland Kitchen  WELCHOL 625 MG tablet Take 1,875 mg by mouth daily with supper.   No facility-administered encounter medications on file as of 01/01/2018.     Surgical History: Past Surgical History:  Procedure Laterality Date  . ABDOMINAL HYSTERECTOMY    . 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 EYE SURGERY     bilateral cataracts  . JOINT REPLACEMENT     bilateral knee replacement  . NASAL SEPTUM SURGERY    . SHOULDER ARTHROSCOPY WITH BICEPSTENOTOMY Right 05/25/2016   Procedure:  SHOULDER ARTHROSCOPY WITH BICEPSTENOTOMY;  Surgeon: Leanor Kail, MD;  Location: [facility] ORS;  Service: Orthopedics;  Laterality: Right;  . SHOULDER ARTHROSCOPY WITH DISTAL CLAVICLE RESECTION Right 05/25/2016   Procedure: SHOULDER ARTHROSCOPY WITH DISTAL CLAVICLE RESECTION;  Surgeon: Leanor Kail, MD;  Location: [facility] ORS;  Service: Orthopedics;  Laterality: Right;  . SHOULDER ARTHROSCOPY WITH OPEN ROTATOR CUFF REPAIR Right 05/25/2016   Procedure: SHOULDER ARTHROSCOPY WITH OPEN ROTATOR CUFF REPAIR;  Surgeon: Leanor Kail, MD;  Location: [facility] ORS;  Service: Orthopedics;  Laterality: Right;  . SUBACROMIAL DECOMPRESSION Right 05/25/2016   Procedure: SUBACROMIAL DECOMPRESSION;  Surgeon: Leanor Kail, MD;  Location: [facility] ORS;  Service: Orthopedics;  Laterality: Right;    Medical History: Past Medical History:  Diagnosis Date  . Arthritis   . 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 Chronic kidney disease    chronic renal insufficiency  . Colon polyps 05/21/14  . COPD (chronic obstructive pulmonary disease) ([place])   . Diabetes mellitus without complication ([place])   . Fracture closed, humerus, shaft    right   . Hyperlipidemia 04/30/14  . Hypertension   . Impingement syndrome of right shoulder 12/29/15  . Personal history of radiation therapy   . Pneumonia    hx  . Rotator cuff tear 04/21/16   right  . Seizures ([place])   . Sleep apnea     Family History: Family History  Problem Relation Age of Onset  . Breast cancer Paternal Aunt 57  . Diabetes Father   . Heart disease Father     Social History: Social History   Socioeconomic History  . Marital status: Single    Spouse name: Not on file  . Number of children: Not on file  . Years of education: Not on file  . Highest education level: Not on file  Social Needs  . Financial resource strain: Not on file  . Food insecurity - worry: Not on file  . Food insecurity - inability: Not on file  . Transportation needs - medical:  Not on file  . Transportation needs - non-medical: Not on file  Occupational History  . Not on file  Tobacco Use  . Smoking status: Former Smoker    Packs/day: 1.00    Years: 40.00    Pack years: 40.00    Last attempt to quit: 05/12/2006    Years since quitting: 11.6  . Smokeless tobacco: Never Used  Substance and Sexual Activity  . Alcohol use: No  . Drug use: No  . Sexual activity: Not on file  Other Topics Concern  . Not on file  Social History Narrative  . Not on file    Vital Signs: Blood pressure (!) 160/76, pulse 79, resp. rate 16, height 5' 7.5" (1.715 m), weight 227 lb 6.4 oz (103.1 kg), SpO2 94 %.  Examination: General Appearance: The patient is well-developed, well-nourished, and in no distress. Skin: Gross inspection of skin unremarkable. Head: normocephalic, no  gross deformities. Eyes: no gross deformities noted. ENT: ears appear grossly normal no exudates. Neck: Supple. No thyromegaly. No LAD. Respiratory: clear at this time. Cardiovascular: Normal S1 and S2 without murmur or rub. Extremities: No cyanosis. pulses are equal. Neurologic: Alert and oriented. No involuntary movements.  LABS: No results found for this or any previous visit (from the past 2160 hour(s)).  Radiology: Mm Digital Screening Bilateral  Result Date: 11/29/2016 CLINICAL DATA:  Screening. EXAM: DIGITAL SCREENING BILATERAL MAMMOGRAM WITH CAD COMPARISON:  Previous exam(s). ACR Breast Density Category c: The breast tissue is heterogeneously dense, which may obscure small masses. FINDINGS: There are no findings suspicious for malignancy. Images were processed with CAD. IMPRESSION: No mammographic evidence of malignancy. A result letter of this screening mammogram will be mailed directly to the patient. RECOMMENDATION: Screening mammogram in one year. (Code:SM-B-01Y) BI-RADS CATEGORY  1: Negative. Electronically Signed   By: Lovey Newcomer M.D.   On: 11/29/2016 11:27    No results found.  No  results found.    Assessment and Plan: Patient Active Problem List   Diagnosis Date Noted  . History of repair of right rotator cuff 08/25/2016  . Acute respiratory failure with hypoxia ([place]) 08/06/2016  . Nontraumatic tear of right rotator cuff 04/21/2016  . Atypical chest pain 04/07/2016  . Impingement syndrome of right shoulder 12/29/2015  . Primary osteoarthritis of first carpometacarpal joint of left hand 10/27/2015  . Right shoulder pain 10/27/2015  . OSA on CPAP 10/14/2015  . COPD with asthma ([place]) 07/13/2015  . Type 2 diabetes mellitus with microalbuminuria, without long-term current use of insulin ([place]) 07/13/2015  . Essential hypertension 03/05/2015  . Difficulty sleeping 11/26/2014  . CRI (chronic renal insufficiency) 07/31/2014  . Seizure disorder ([place]) 07/31/2014  . History of colonic polyps 05/21/2014  . Hyperlipidemia, unspecified 04/30/2014    1. COPD stable at this time will follow along 2. Allergic rhinitis continue with her current regimen for the allergy shots 3. OSA on her CPAP and is tolerating it well  General Counseling: I have discussed the findings of the evaluation and examination with Arbie Cookey.  I have also discussed any further diagnostic evaluation thatmay be needed or ordered today. Maiah verbalizes understanding of the findings of todays visit. We also reviewed her medications today and discussed drug interactions and side effects including but not limited excessive drowsiness and altered mental states. We also discussed that there is always a risk not just to her but also people around her. she has been encouraged to call the office with any questions or concerns that should arise related to todays visit.    Time spent: 7min  I have personally obtained a history, examined the patient, evaluated laboratory and imaging results, formulated the assessment and plan and placed orders.    Allyne Gee, MD [facility] Pulmonary and Critical Care Sleep  medicine

## 2018-01-03 DIAGNOSIS — J449 Chronic obstructive pulmonary disease, unspecified: Secondary | ICD-10-CM | POA: Diagnosis not present

## 2018-01-03 DIAGNOSIS — E1129 Type 2 diabetes mellitus with other diabetic kidney complication: Secondary | ICD-10-CM | POA: Diagnosis not present

## 2018-01-03 DIAGNOSIS — I5032 Chronic diastolic (congestive) heart failure: Secondary | ICD-10-CM | POA: Diagnosis not present

## 2018-01-03 DIAGNOSIS — M129 Arthropathy, unspecified: Secondary | ICD-10-CM | POA: Diagnosis not present

## 2018-01-03 DIAGNOSIS — Z9989 Dependence on other enabling machines and devices: Secondary | ICD-10-CM | POA: Diagnosis not present

## 2018-01-03 DIAGNOSIS — J3089 Other allergic rhinitis: Secondary | ICD-10-CM | POA: Diagnosis not present

## 2018-01-03 DIAGNOSIS — E7800 Pure hypercholesterolemia, unspecified: Secondary | ICD-10-CM | POA: Diagnosis not present

## 2018-01-03 DIAGNOSIS — R809 Proteinuria, unspecified: Secondary | ICD-10-CM | POA: Diagnosis not present

## 2018-01-03 DIAGNOSIS — G40909 Epilepsy, unspecified, not intractable, without status epilepticus: Secondary | ICD-10-CM | POA: Diagnosis not present

## 2018-01-03 DIAGNOSIS — I10 Essential (primary) hypertension: Secondary | ICD-10-CM | POA: Diagnosis not present

## 2018-01-03 DIAGNOSIS — G4733 Obstructive sleep apnea (adult) (pediatric): Secondary | ICD-10-CM | POA: Diagnosis not present

## 2018-01-08 DIAGNOSIS — E1129 Type 2 diabetes mellitus with other diabetic kidney complication: Secondary | ICD-10-CM | POA: Diagnosis not present

## 2018-01-08 DIAGNOSIS — R809 Proteinuria, unspecified: Secondary | ICD-10-CM | POA: Diagnosis not present

## 2018-01-08 DIAGNOSIS — E7800 Pure hypercholesterolemia, unspecified: Secondary | ICD-10-CM | POA: Diagnosis not present

## 2018-01-16 DIAGNOSIS — H353221 Exudative age-related macular degeneration, left eye, with active choroidal neovascularization: Secondary | ICD-10-CM | POA: Diagnosis not present

## 2018-01-22 ENCOUNTER — Other Ambulatory Visit: Payer: Self-pay | Admitting: Family Medicine

## 2018-01-22 DIAGNOSIS — Z1231 Encounter for screening mammogram for malignant neoplasm of breast: Secondary | ICD-10-CM

## 2018-01-23 ENCOUNTER — Ambulatory Visit
Admission: RE | Admit: 2018-01-23 | Discharge: 2018-01-23 | Disposition: A | Discharge status: Home / Self Care | Payer: Medicare Other | Source: Ambulatory Visit | Attending: Family Medicine | Admitting: Family Medicine

## 2018-01-23 DIAGNOSIS — Z1231 Encounter for screening mammogram for malignant neoplasm of breast: Secondary | ICD-10-CM | POA: Diagnosis not present

## 2018-01-23 DIAGNOSIS — R809 Proteinuria, unspecified: Secondary | ICD-10-CM | POA: Diagnosis not present

## 2018-01-23 DIAGNOSIS — I10 Essential (primary) hypertension: Secondary | ICD-10-CM | POA: Diagnosis not present

## 2018-01-23 DIAGNOSIS — E1121 Type 2 diabetes mellitus with diabetic nephropathy: Secondary | ICD-10-CM | POA: Diagnosis not present

## 2018-01-23 DIAGNOSIS — N182 Chronic kidney disease, stage 2 (mild): Secondary | ICD-10-CM | POA: Diagnosis not present

## 2018-02-21 ENCOUNTER — Ambulatory Visit (INDEPENDENT_AMBULATORY_CARE_PROVIDER_SITE_OTHER): Payer: Medicare Other

## 2018-02-21 DIAGNOSIS — J301 Allergic rhinitis due to pollen: Secondary | ICD-10-CM | POA: Diagnosis not present

## 2018-03-06 DIAGNOSIS — H353112 Nonexudative age-related macular degeneration, right eye, intermediate dry stage: Secondary | ICD-10-CM | POA: Diagnosis not present

## 2018-03-06 DIAGNOSIS — H353221 Exudative age-related macular degeneration, left eye, with active choroidal neovascularization: Secondary | ICD-10-CM | POA: Diagnosis not present

## 2018-03-21 ENCOUNTER — Ambulatory Visit: Payer: Self-pay

## 2018-04-02 DIAGNOSIS — E1165 Type 2 diabetes mellitus with hyperglycemia: Secondary | ICD-10-CM | POA: Diagnosis not present

## 2018-04-02 DIAGNOSIS — E1129 Type 2 diabetes mellitus with other diabetic kidney complication: Secondary | ICD-10-CM | POA: Diagnosis not present

## 2018-04-02 DIAGNOSIS — E7800 Pure hypercholesterolemia, unspecified: Secondary | ICD-10-CM | POA: Diagnosis not present

## 2018-04-02 DIAGNOSIS — R0789 Other chest pain: Secondary | ICD-10-CM | POA: Diagnosis not present

## 2018-04-02 DIAGNOSIS — I10 Essential (primary) hypertension: Secondary | ICD-10-CM | POA: Diagnosis not present

## 2018-04-02 DIAGNOSIS — R809 Proteinuria, unspecified: Secondary | ICD-10-CM | POA: Diagnosis not present

## 2018-04-02 DIAGNOSIS — Z9989 Dependence on other enabling machines and devices: Secondary | ICD-10-CM | POA: Diagnosis not present

## 2018-04-02 DIAGNOSIS — G4733 Obstructive sleep apnea (adult) (pediatric): Secondary | ICD-10-CM | POA: Diagnosis not present

## 2018-04-03 DIAGNOSIS — Z9989 Dependence on other enabling machines and devices: Secondary | ICD-10-CM | POA: Diagnosis not present

## 2018-04-03 DIAGNOSIS — E1129 Type 2 diabetes mellitus with other diabetic kidney complication: Secondary | ICD-10-CM | POA: Diagnosis not present

## 2018-04-03 DIAGNOSIS — I10 Essential (primary) hypertension: Secondary | ICD-10-CM | POA: Diagnosis not present

## 2018-04-03 DIAGNOSIS — G40909 Epilepsy, unspecified, not intractable, without status epilepticus: Secondary | ICD-10-CM | POA: Diagnosis not present

## 2018-04-03 DIAGNOSIS — M129 Arthropathy, unspecified: Secondary | ICD-10-CM | POA: Diagnosis not present

## 2018-04-03 DIAGNOSIS — J449 Chronic obstructive pulmonary disease, unspecified: Secondary | ICD-10-CM | POA: Diagnosis not present

## 2018-04-03 DIAGNOSIS — I5032 Chronic diastolic (congestive) heart failure: Secondary | ICD-10-CM | POA: Diagnosis not present

## 2018-04-03 DIAGNOSIS — E7800 Pure hypercholesterolemia, unspecified: Secondary | ICD-10-CM | POA: Diagnosis not present

## 2018-04-03 DIAGNOSIS — G4733 Obstructive sleep apnea (adult) (pediatric): Secondary | ICD-10-CM | POA: Diagnosis not present

## 2018-04-03 DIAGNOSIS — J3089 Other allergic rhinitis: Secondary | ICD-10-CM | POA: Diagnosis not present

## 2018-04-03 DIAGNOSIS — R809 Proteinuria, unspecified: Secondary | ICD-10-CM | POA: Diagnosis not present

## 2018-04-16 ENCOUNTER — Ambulatory Visit (INDEPENDENT_AMBULATORY_CARE_PROVIDER_SITE_OTHER): Payer: Medicare Other

## 2018-04-16 DIAGNOSIS — J301 Allergic rhinitis due to pollen: Secondary | ICD-10-CM | POA: Diagnosis not present

## 2018-04-16 DIAGNOSIS — H353221 Exudative age-related macular degeneration, left eye, with active choroidal neovascularization: Secondary | ICD-10-CM | POA: Diagnosis not present

## 2018-05-14 ENCOUNTER — Ambulatory Visit (INDEPENDENT_AMBULATORY_CARE_PROVIDER_SITE_OTHER): Payer: Medicare Other

## 2018-05-14 DIAGNOSIS — J301 Allergic rhinitis due to pollen: Secondary | ICD-10-CM | POA: Diagnosis not present

## 2018-06-04 DIAGNOSIS — H353221 Exudative age-related macular degeneration, left eye, with active choroidal neovascularization: Secondary | ICD-10-CM | POA: Diagnosis not present

## 2018-06-11 ENCOUNTER — Ambulatory Visit (INDEPENDENT_AMBULATORY_CARE_PROVIDER_SITE_OTHER): Payer: Medicare Other

## 2018-06-11 DIAGNOSIS — J301 Allergic rhinitis due to pollen: Secondary | ICD-10-CM | POA: Diagnosis not present

## 2018-06-13 ENCOUNTER — Other Ambulatory Visit: Payer: Self-pay

## 2018-06-13 MED ORDER — FLUTICASONE PROPIONATE 50 MCG/ACT NA SUSP: 2.0000 | Freq: Every day | NASAL | 2 refills | Status: DC

## 2018-06-13 NOTE — Telephone Encounter (Signed)
Pt called today for refills for flonase  esend to [facility]

## 2018-07-03 ENCOUNTER — Ambulatory Visit (INDEPENDENT_AMBULATORY_CARE_PROVIDER_SITE_OTHER): Payer: Medicare Other | Admitting: Adult Health

## 2018-07-03 ENCOUNTER — Encounter: Payer: Self-pay | Admitting: Adult Health

## 2018-07-03 VITALS — BP 140/80 | HR 78 | Resp 16 | Ht 67.0 in | Wt 229.0 lb

## 2018-07-03 DIAGNOSIS — J449 Chronic obstructive pulmonary disease, unspecified: Secondary | ICD-10-CM

## 2018-07-03 DIAGNOSIS — G4733 Obstructive sleep apnea (adult) (pediatric): Secondary | ICD-10-CM | POA: Diagnosis not present

## 2018-07-03 DIAGNOSIS — J301 Allergic rhinitis due to pollen: Secondary | ICD-10-CM

## 2018-07-03 DIAGNOSIS — Z9989 Dependence on other enabling machines and devices: Secondary | ICD-10-CM | POA: Diagnosis not present

## 2018-07-03 DIAGNOSIS — R0602 Shortness of breath: Secondary | ICD-10-CM

## 2018-07-03 NOTE — Patient Instructions (Signed)

## 2018-07-03 NOTE — Progress Notes (Signed)
[facility] [place], [place] [address]  Pulmonary Sleep Medicine   Office Visit Note  Patient Name: Angelica Juarez DOB: Feb 12, 1941 MRN [id number]  Date of Service: 07/19/2018  Complaints/HPI:  Pt here for follow up pulmonology.  She reports her breathing has been very difficult this summer.  She reports DOE, as well as difficulty getting going in the mornings.  She reports using her CPAP nightly.  She reports using her inhalers as prescribed.  She also has been using her rescue inhaler more this summer than she has had to in many years.       ROS  General: (-) fever, (-) chills, (-) night sweats, (-) weakness Skin: (-) rashes, (-) itching,. Eyes: (-) visual changes, (-) redness, (-) itching. Nose and Sinuses: (-) nasal stuffiness or itchiness, (-) postnasal drip, (-) nosebleeds, (-) sinus trouble. Mouth and Throat: (-) sore throat, (-) hoarseness. Neck: (-) swollen glands, (-) enlarged thyroid, (-) neck pain. Respiratory: + cough, (-) bloody sputum, - shortness of breath, -wheezing. Cardiovascular: - ankle swelling, (-) chest pain. Lymphatic: (-) lymph node enlargement. Neurologic: (-) numbness, (-) tingling. Psychiatric: (-) anxiety, (-) depression   Current Medication: Outpatient Encounter Medications as of 07/03/2018  Medication Sig  . albuterol (ACCUNEB) 1.25 MG/3ML nebulizer solution Take 1 ampule by nebulization every 6 (six) hours as needed. wheezing  . aspirin 81 MG tablet Take 81 mg by mouth daily.  Marland Kitchen atorvastatin (LIPITOR) 40 MG tablet Take 40 mg by mouth daily.  . B Complex Vitamins (VITAMIN B COMPLEX PO) Take 1 tablet by mouth daily.  . cloNIDine (CATAPRES) 0.2 MG tablet Take 0.2 mg by mouth 2 (two) times daily.  Marland Kitchen EPINEPHrine (EPIPEN 2-PAK) 0.3 mg/0.3 mL IJ SOAJ injection 0.3 mg once. Reported on 05/25/2016  . fluticasone (FLONASE) 50 MCG/ACT nasal spray Place 2 sprays into both nostrils daily.  . furosemide (LASIX) 20 MG tablet Take 1 tablet  (20 mg total) by mouth 2 (two) times daily.  . hydrALAZINE (APRESOLINE) 100 MG tablet Take 100 mg by mouth 3 (three) times daily.  Marland Kitchen levETIRAcetam (KEPPRA) 750 MG tablet Take 750 mg by mouth 2 (two) times daily.  . metFORMIN (GLUCOPHAGE) 500 MG tablet Take 500 mg by mouth 2 (two) times daily with a meal.  . montelukast (SINGULAIR) 10 MG tablet TAKE 1 TABLET BY MOUTH EVERY DAY  . NIFEdipine (NIFEDICAL XL) 30 MG 24 hr tablet Take 30 mg by mouth daily.  . traZODone (DESYREL) 50 MG tablet Take 50-100 mg by mouth at bedtime as needed. Sleep  . umeclidinium-vilanterol (ANORO ELLIPTA) 62.5-25 MCG/INH AEPB Inhale 1 puff into the lungs daily.  . valsartan-hydrochlorothiazide (DIOVAN-HCT) 320-25 MG tablet Take 1 tablet by mouth daily.  . vitamin C (ASCORBIC ACID) 500 MG tablet Take 500 mg by mouth 2 (two) times daily.  Marland Kitchen atenolol (TENORMIN) 100 MG tablet Take 100 mg by mouth daily.  . Calcium Carbonate-Vitamin D 600-200 MG-UNIT CAPS Take 1 capsule by mouth daily.  Marland Kitchen OVER THE COUNTER MEDICATION Place 1 drop into both eyes daily. Allergy eye drops  . predniSONE (DELTASONE) 10 MG tablet 20 mg po daily for 2 days, 10 mg po daily for 2 days. (Patient not taking: Reported on 07/03/2018)  . ROXICET 5-325 MG tablet Take 1-2 tablets by mouth every 6 (six) hours as needed for severe pain. (Patient not taking: Reported on 08/06/2016)  . traMADol (ULTRAM) 50 MG tablet Take 50 mg by mouth every 8 (eight) hours as needed. pain  .  WELCHOL 625 MG tablet Take 1,875 mg by mouth daily with supper.   No facility-administered encounter medications on file as of 07/03/2018.     Surgical History: Past Surgical History:  Procedure Laterality Date  . ABDOMINAL HYSTERECTOMY    . 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 EYE SURGERY     bilateral cataracts  . JOINT REPLACEMENT     bilateral knee replacement  . NASAL SEPTUM SURGERY    . SHOULDER ARTHROSCOPY WITH BICEPSTENOTOMY Right 05/25/2016   Procedure:  SHOULDER ARTHROSCOPY WITH BICEPSTENOTOMY;  Surgeon: Leanor Kail, MD;  Location: [facility] ORS;  Service: Orthopedics;  Laterality: Right;  . SHOULDER ARTHROSCOPY WITH DISTAL CLAVICLE RESECTION Right 05/25/2016   Procedure: SHOULDER ARTHROSCOPY WITH DISTAL CLAVICLE RESECTION;  Surgeon: Leanor Kail, MD;  Location: [facility] ORS;  Service: Orthopedics;  Laterality: Right;  . SHOULDER ARTHROSCOPY WITH OPEN ROTATOR CUFF REPAIR Right 05/25/2016   Procedure: SHOULDER ARTHROSCOPY WITH OPEN ROTATOR CUFF REPAIR;  Surgeon: Leanor Kail, MD;  Location: [facility] ORS;  Service: Orthopedics;  Laterality: Right;  . SUBACROMIAL DECOMPRESSION Right 05/25/2016   Procedure: SUBACROMIAL DECOMPRESSION;  Surgeon: Leanor Kail, MD;  Location: [facility] ORS;  Service: Orthopedics;  Laterality: Right;    Medical History: Past Medical History:  Diagnosis Date  . Arthritis   . 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 Chronic kidney disease    chronic renal insufficiency  . Colon polyps 05/21/14  . COPD (chronic obstructive pulmonary disease) ([place])   . Diabetes mellitus without complication ([place])   . Fracture closed, humerus, shaft    right   . Hyperlipidemia 04/30/14  . Hypertension   . Impingement syndrome of right shoulder 12/29/15  . Personal history of radiation therapy   . Pneumonia    hx  . Rotator cuff tear 04/21/16   right  . Seizures ([place])   . Sleep apnea     Family History: Family History  Problem Relation Age of Onset  . Breast cancer Paternal Aunt 20  . Diabetes Father   . Heart disease Father     Social History: Social History   Socioeconomic History  . Marital status: Single    Spouse name: Not on file  . Number of children: Not on file  . Years of education: Not on file  . Highest education level: Not on file  Occupational History  . Not on file  Social Needs  . Financial resource strain: Not on file  . Food insecurity:    Worry: Not on file    Inability: Not on file  .  Transportation needs:    Medical: Not on file    Non-medical: Not on file  Tobacco Use  . Smoking status: Former Smoker    Packs/day: 1.00    Years: 40.00    Pack years: 40.00    Last attempt to quit: 05/12/2006    Years since quitting: 12.1  . Smokeless tobacco: Never Used  Substance and Sexual Activity  . Alcohol use: No  . Drug use: No  . Sexual activity: Not on file  Lifestyle  . Physical activity:    Days per week: Not on file    Minutes per session: Not on file  . Stress: Not on file  Relationships  . Social connections:    Talks on phone: Not on file    Gets together: Not on file    Attends religious service: Not on file    Active member of club  or organization: Not on file    Attends meetings of clubs or organizations: Not on file    Relationship status: Not on file  . Intimate partner violence:    Fear of current or ex partner: Not on file    Emotionally abused: Not on file    Physically abused: Not on file    Forced sexual activity: Not on file  Other Topics Concern  . Not on file  Social History Narrative  . Not on file    Vital Signs: Height 5\' 7"  (1.702 m), weight 229 lb (103.9 kg).  Examination: General Appearance: The patient is well-developed, well-nourished, and in no distress. Skin: Gross inspection of skin unremarkable. Head: normocephalic, no gross deformities. Eyes: no gross deformities noted. ENT: ears appear grossly normal no exudates. Neck: Supple. No thyromegaly. No LAD. Respiratory: Clear to auscultation bilaterally. Cardiovascular: Normal S1 and S2 without murmur or rub. Extremities: No cyanosis. pulses are equal. Neurologic: Alert and oriented. No involuntary movements.  Assessment and Plan: Patient Active Problem List   Diagnosis Date Noted  . History of repair of right rotator cuff 08/25/2016  . Acute respiratory failure with hypoxia ([place]) 08/06/2016  . Nontraumatic tear of right rotator cuff 04/21/2016  . Atypical chest pain  04/07/2016  . Impingement syndrome of right shoulder 12/29/2015  . Primary osteoarthritis of first carpometacarpal joint of left hand 10/27/2015  . Right shoulder pain 10/27/2015  . OSA on CPAP 10/14/2015  . COPD with asthma ([place]) 07/13/2015  . Type 2 diabetes mellitus with microalbuminuria, without long-term current use of insulin ([place]) 07/13/2015  . Essential hypertension 03/05/2015  . Difficulty sleeping 11/26/2014  . CRI (chronic renal insufficiency) 07/31/2014  . Seizure disorder ([place]) 07/31/2014  . History of colonic polyps 05/21/2014  . Hyperlipidemia, unspecified 04/30/2014   1. Obstructive chronic bronchitis without exacerbation ([place]) Continue current medications. Follow up in 6 weeks. Will discuss getting new PFT at next visit.   She needs a new nebulizer machine as her is not working.   2. Allergic rhinitis due to pollen, unspecified seasonality Continue allergy shots and medications as prescribed.   3. OSA on CPAP Good compliance, continue to wear CPAP.  4. SOB (shortness of breath) - Spirometry with Graph  General Counseling: I have discussed the findings of the evaluation and examination with Arbie Cookey.  I have also discussed any further diagnostic evaluation thatmay be needed or ordered today. Kamillah verbalizes understanding of the findings of todays visit. We also reviewed her medications today and discussed drug interactions and side effects including but not limited excessive drowsiness and altered mental states. We also discussed that there is always a risk not just to her but also people around her. she has been encouraged to call the office with any questions or concerns that should arise related to todays visit.    Time spent: 25 This patient was seen by Orson Gear AGNP-C in Collaboration with Dr. Devona Konig as a part of collaborative care agreement.  I have personally obtained a history, examined the patient, evaluated laboratory and imaging results, formulated  the assessment and plan and placed orders.    Allyne Gee, MD [facility] Pulmonary and Critical Care Sleep medicine

## 2018-07-09 ENCOUNTER — Ambulatory Visit (INDEPENDENT_AMBULATORY_CARE_PROVIDER_SITE_OTHER): Payer: Medicare Other

## 2018-07-09 DIAGNOSIS — M129 Arthropathy, unspecified: Secondary | ICD-10-CM | POA: Diagnosis not present

## 2018-07-09 DIAGNOSIS — J449 Chronic obstructive pulmonary disease, unspecified: Secondary | ICD-10-CM | POA: Diagnosis not present

## 2018-07-09 DIAGNOSIS — R809 Proteinuria, unspecified: Secondary | ICD-10-CM | POA: Diagnosis not present

## 2018-07-09 DIAGNOSIS — J3089 Other allergic rhinitis: Secondary | ICD-10-CM | POA: Diagnosis not present

## 2018-07-09 DIAGNOSIS — Z9989 Dependence on other enabling machines and devices: Secondary | ICD-10-CM | POA: Diagnosis not present

## 2018-07-09 DIAGNOSIS — J301 Allergic rhinitis due to pollen: Secondary | ICD-10-CM | POA: Diagnosis not present

## 2018-07-09 DIAGNOSIS — I5032 Chronic diastolic (congestive) heart failure: Secondary | ICD-10-CM | POA: Diagnosis not present

## 2018-07-09 DIAGNOSIS — E1129 Type 2 diabetes mellitus with other diabetic kidney complication: Secondary | ICD-10-CM | POA: Diagnosis not present

## 2018-07-09 DIAGNOSIS — G40909 Epilepsy, unspecified, not intractable, without status epilepticus: Secondary | ICD-10-CM | POA: Diagnosis not present

## 2018-07-09 DIAGNOSIS — G4733 Obstructive sleep apnea (adult) (pediatric): Secondary | ICD-10-CM | POA: Diagnosis not present

## 2018-07-09 DIAGNOSIS — E7800 Pure hypercholesterolemia, unspecified: Secondary | ICD-10-CM | POA: Diagnosis not present

## 2018-07-09 DIAGNOSIS — I10 Essential (primary) hypertension: Secondary | ICD-10-CM | POA: Diagnosis not present

## 2018-07-10 ENCOUNTER — Telehealth: Payer: Self-pay

## 2018-07-10 NOTE — Telephone Encounter (Signed)
Faxed paperwork to [facility] Patient for pt nebulizer.

## 2018-07-13 DIAGNOSIS — R809 Proteinuria, unspecified: Secondary | ICD-10-CM | POA: Diagnosis not present

## 2018-07-13 DIAGNOSIS — E1129 Type 2 diabetes mellitus with other diabetic kidney complication: Secondary | ICD-10-CM | POA: Diagnosis not present

## 2018-07-13 DIAGNOSIS — E7800 Pure hypercholesterolemia, unspecified: Secondary | ICD-10-CM | POA: Diagnosis not present

## 2018-07-19 ENCOUNTER — Encounter: Payer: Self-pay | Admitting: Adult Health

## 2018-07-26 DIAGNOSIS — N183 Chronic kidney disease, stage 3 (moderate): Secondary | ICD-10-CM | POA: Diagnosis not present

## 2018-07-26 DIAGNOSIS — I10 Essential (primary) hypertension: Secondary | ICD-10-CM | POA: Diagnosis not present

## 2018-07-26 DIAGNOSIS — E1122 Type 2 diabetes mellitus with diabetic chronic kidney disease: Secondary | ICD-10-CM | POA: Diagnosis not present

## 2018-07-26 DIAGNOSIS — E1121 Type 2 diabetes mellitus with diabetic nephropathy: Secondary | ICD-10-CM | POA: Diagnosis not present

## 2018-08-06 ENCOUNTER — Other Ambulatory Visit: Payer: Self-pay | Admitting: Internal Medicine

## 2018-08-06 ENCOUNTER — Other Ambulatory Visit: Payer: Self-pay

## 2018-08-06 ENCOUNTER — Ambulatory Visit (INDEPENDENT_AMBULATORY_CARE_PROVIDER_SITE_OTHER): Payer: Medicare Other

## 2018-08-06 DIAGNOSIS — J301 Allergic rhinitis due to pollen: Secondary | ICD-10-CM

## 2018-08-06 DIAGNOSIS — R0602 Shortness of breath: Secondary | ICD-10-CM

## 2018-08-06 DIAGNOSIS — H353221 Exudative age-related macular degeneration, left eye, with active choroidal neovascularization: Secondary | ICD-10-CM | POA: Diagnosis not present

## 2018-08-07 NOTE — Progress Notes (Signed)
[facility] [facility] day:  08/08/2018  Chief Complaint: Angelica Juarez is a 77 y.o. female with polycythemia who is referred in consultation by Dr. Holley Raring for assessment and management.  HPI:  The patient is followed in the nephrology clinic for hypertension and proteinuria.  She has a prior history of tobacco use, but quit in 11/2002.  CBCs dating from 09/25/2012 - 08/07/2016 in Epic reveals a hematocrit 36.6 - 41.0 and hemoglobin 12.2 - 14.2.  Platelet count ranged from 315,000 - 402,000.  WBC ranged from 10,600 - 17,600.  CBC on 07/26/2018 revealed a hematocrit of 49.3, hemoglobin 16.5, MCV 88.2, platelets 520,000, WBC 15,500 with an ANC of 12,958.  Differential was unremarkable.  Creatinine was 0.95, calcium 10.6, and LFTs normal.    Patient has "lots of respiratory issues" and does not do well in the heat. She notes issues with exertional dyspnea. She is overall fatigued. Patient's husband has been hospitalized for  >140 days. He is home now and patient is providing total care. Additionally, patient's daughter is s/p "breast cancer surgery" about 2 weeks ago. She states, "It is wearing on me, but I woke up today and I am alive".   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 She underwent surgical intervention and radiation therapy. She did not receive chemotherapy.   Patient with a history of OSAH syndrome. She faithfully uses her prescribed nocturnal PAP therapy. Patient has lost about 30 pounds over the course of 6 months. Patient states, "It was because I was running back and forth to [facility]. I didn't make time to eat". Patient has significant issues with seasonal allergies. She has a macular degeneration. She received "an injection" in her LEFT eye on 08/06/2018. 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   Patient complains of musculoskeletal pain to her LEFT lateral torso. Pain is intermittent in nature, and exacerbates with rapid movements.  Patient has been seen by PCP and Dr. Holley Raring for the same. She denies pain in the clinic today when stationary in a seated position (high back chair).   Patient advises that she maintains an adequate appetite. She is eating well. Weight today is 224 lb 3.3 oz (101.7 kg). Patient takes several vitamin supplements daily. She denies any new medications or herbal supplements.   Patient denies any family history that is significant for any type of hematologic disorders. Her daughter has breast cancer. Significant family history of diabetes and heart disease.    Past Medical History:  Diagnosis Date  . Allergy    Seasonal  . Arthritis   . Cancer [facility])    fallopian tubes- radiation  . Chronic kidney disease    chronic renal insufficiency  . Colon polyps 05/21/14  . COPD (chronic obstructive pulmonary disease) ([place])   . Diabetes mellitus without complication ([place])   . Fracture closed, humerus, shaft    right   . Hyperlipidemia 04/30/14  . Hypertension   . Impingement syndrome of right shoulder 12/29/15  . Personal history of radiation therapy   . Pneumonia    hx  . Rotator cuff tear 04/21/16   right  . Seizures ([place])   . Sleep apnea     Past Surgical History:  Procedure Laterality Date  . ABDOMINAL HYSTERECTOMY    . 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 EYE SURGERY     bilateral cataracts  . JOINT REPLACEMENT     bilateral knee replacement  .  NASAL SEPTUM SURGERY    . SHOULDER ARTHROSCOPY WITH BICEPSTENOTOMY Right 05/25/2016   Procedure: SHOULDER ARTHROSCOPY WITH BICEPSTENOTOMY;  Surgeon: Leanor Kail, MD;  Location: [facility] ORS;  Service: Orthopedics;  Laterality: Right;  . SHOULDER ARTHROSCOPY WITH DISTAL CLAVICLE RESECTION Right 05/25/2016   Procedure: SHOULDER ARTHROSCOPY WITH DISTAL CLAVICLE RESECTION;  Surgeon: Leanor Kail, MD;  Location: [facility] ORS;  Service: Orthopedics;  Laterality: Right;  . SHOULDER ARTHROSCOPY WITH OPEN ROTATOR CUFF REPAIR Right 05/25/2016    Procedure: SHOULDER ARTHROSCOPY WITH OPEN ROTATOR CUFF REPAIR;  Surgeon: Leanor Kail, MD;  Location: [facility] ORS;  Service: Orthopedics;  Laterality: Right;  . SUBACROMIAL DECOMPRESSION Right 05/25/2016   Procedure: SUBACROMIAL DECOMPRESSION;  Surgeon: Leanor Kail, MD;  Location: [facility] ORS;  Service: Orthopedics;  Laterality: Right;    Family History  Problem Relation Age of Onset  . Breast cancer Paternal Aunt 71  . Diabetes Father   . Heart disease Father     Social History:  reports that she quit smoking about 13 years ago. She has a 40.00 pack-year smoking history. She has never used smokeless tobacco. She reports that she does not drink alcohol or use drugs.  Patient moved to [place] in 2001 from [place]. Patient is a former 1 ppd smoker x 40 years; quit in 2006. She is retired from [employer]. Patient denies known exposures to radiation on toxins. The patient is alone today.  Allergies:  Allergies  Allergen Reactions  . Iodinated Diagnostic Agents Anaphylaxis    Other reaction(s): Other (See Comments) Throat swells and extreme hives  . Latex Itching  . Phenobarbital Hives  . Tape Rash    silicones    Current Medications: Current Outpatient Medications  Medication Sig Dispense Refill  . albuterol (ACCUNEB) 1.25 MG/3ML nebulizer solution Take 1 ampule by nebulization every 6 (six) hours as needed. wheezing  3  . albuterol (ACCUNEB) 1.25 MG/3ML nebulizer solution USE 1 VIAL VIA NEBULIZER EVERY 6 HOURS AS NEEDED FOR WHEEZING    . albuterol (PROVENTIL HFA;VENTOLIN HFA) 108 (90 Base) MCG/ACT inhaler Inhale into the lungs as needed.    Marland Kitchen aspirin 81 MG tablet Take 81 mg by mouth daily.    Marland Kitchen atorvastatin (LIPITOR) 40 MG tablet Take 40 mg by mouth daily.    . B Complex Vitamins (VITAMIN B COMPLEX PO) Take 1 tablet by mouth daily.    . Calcium Carbonate-Vitamin D 600-200 MG-UNIT CAPS Take 1 capsule by mouth daily.    . cloNIDine (CATAPRES) 0.2 MG tablet Take 0.2 mg by mouth 2 (two) times  daily.    . diphenhydrAMINE (BENADRYL) 25 mg capsule Take 25 mg by mouth daily.    Marland Kitchen EPINEPHrine (EPIPEN 2-PAK) 0.3 mg/0.3 mL IJ SOAJ injection 0.3 mg once. Reported on 05/25/2016    . fluticasone (FLONASE) 50 MCG/ACT nasal spray Place 2 sprays into both nostrils daily. 16 g 2  . furosemide (LASIX) 20 MG tablet Take 1 tablet (20 mg total) by mouth 2 (two) times daily. 60 tablet 0  . glucose blood (ONE TOUCH ULTRA TEST) test strip USE ONE STRIP TO CHECK GLUCOSE ONCE DAILY    . hydrALAZINE (APRESOLINE) 100 MG tablet Take 100 mg by mouth 3 (three) times daily.    Marland Kitchen levETIRAcetam (KEPPRA) 750 MG tablet Take 750 mg by mouth 2 (two) times daily.    . metFORMIN (GLUCOPHAGE) 500 MG tablet Take 500 mg by mouth 2 (two) times daily with a meal.    . metoprolol succinate (TOPROL-XL) 100 MG 24 hr tablet  Take 100 mg by mouth daily.    . montelukast (SINGULAIR) 10 MG tablet TAKE 1 TABLET BY MOUTH EVERY DAY 90 tablet 0  . Multiple Vitamin (MULTI-VITAMINS) TABS Take 1 tablet by mouth daily.    . Multiple Vitamins-Minerals (MULTIVITAMIN WITH MINERALS) tablet Take 1 tablet by mouth daily.    . Multiple Vitamins-Minerals (PRESERVISION AREDS 2+MULTI VIT PO) Take by mouth.    Marland Kitchen NIFEdipine (NIFEDICAL XL) 30 MG 24 hr tablet Take 30 mg by mouth daily.    Marland Kitchen OVER THE COUNTER MEDICATION Place 1 drop into both eyes daily. Allergy eye drops    . sitaGLIPtin (JANUVIA) 25 MG tablet Take 1 tablet by mouth daily.    . traZODone (DESYREL) 50 MG tablet Take 50-100 mg by mouth at bedtime as needed. Sleep  0  . umeclidinium-vilanterol (ANORO ELLIPTA) 62.5-25 MCG/INH AEPB Inhale 1 puff into the lungs daily.    . valsartan-hydrochlorothiazide (DIOVAN-HCT) 320-25 MG tablet Take 1 tablet by mouth daily.  0  . vitamin C (ASCORBIC ACID) 500 MG tablet Take 500 mg by mouth 2 (two) times daily.     No current facility-administered medications for this visit.     Review of Systems  Constitutional: Positive for weight loss (30 pound weight  loss in 6 months; stabilizing). Negative for diaphoresis, fever and malaise/fatigue.  HENT: Negative.   Eyes: Negative.        Macular degeneration. "Injection" to LEFT eye on 08/06/2018.  Respiratory: Negative for cough, hemoptysis, sputum production and shortness of breath.        PMH (+) for OSAH; uses nocturnal PAP therapy  Cardiovascular: Negative for chest pain, palpitations, orthopnea, leg swelling and PND.  Gastrointestinal: Negative for abdominal pain, blood in stool, constipation, diarrhea, melena, nausea and vomiting.  Genitourinary: Negative for dysuria, frequency, hematuria and urgency.  Musculoskeletal: Positive for back pain and joint pain (DDD; OA). Negative for falls.       Non-specific pain to LEFT lateral torso  Skin: Negative for itching and rash.  Neurological: Negative for dizziness, tremors, weakness and headaches.  Endo/Heme/Allergies: Positive for environmental allergies (seasonal). Does not bruise/bleed easily.  Psychiatric/Behavioral: Negative for depression, memory loss and suicidal ideas. The patient is not nervous/anxious and does not have insomnia.        Increased situation stress r/t the care of her husband (see HPI).  All other systems reviewed and are negative.  Performance status (ECOG): 0 - Asymptomatic  Vital Signs: BP (!) 157/80 (BP Location: Right Arm, Patient Position: Sitting)   Pulse 67   Temp (!) 94.4 F (34.7 C) (Tympanic)   Resp 18   Ht _0  (1.702 m)   Wt 224 lb 3.3 oz (101.7 kg)   SpO2 96%   BMI 35.12 kg/m   Physical Exam  Constitutional: She is oriented to person, place, and time and well-developed, well-nourished, and in no distress.  HENT:  Head: Normocephalic and atraumatic.  Short gray hair.  Dentures.  Eyes: Pupils are equal, round, and reactive to light. EOM are normal. No scleral icterus.  Glasses. Brown eyes.  Left eye injected.  Neck: Normal range of motion. Neck supple. No tracheal deviation present. No thyromegaly  present.  Cardiovascular: Normal rate, regular rhythm and normal heart sounds. Exam reveals no gallop and no friction rub.  No murmur heard. Pulmonary/Chest: Effort normal and breath sounds normal. No respiratory distress. She has no wheezes. She has no rales.  Abdominal: Soft. Bowel sounds are normal. She exhibits no distension. There  is no tenderness.  Musculoskeletal: Normal range of motion. She exhibits edema (trace). She exhibits no tenderness.  Lymphadenopathy:    She has no cervical adenopathy.    She has no axillary adenopathy.       Right: No inguinal and no supraclavicular adenopathy present.       Left: No inguinal and no supraclavicular adenopathy present.  Neurological: She is alert and oriented to person, place, and time.  Skin: Skin is warm and dry. No rash noted. No erythema.  Psychiatric: Mood, affect and judgment normal.  Nursing note and vitals reviewed.   No visits with results within 3 Day(s) from this visit.  Latest known visit with results is:  Admission on 08/06/2016, Discharged on 08/08/2016  Component Date Value Ref Range Status  . Sodium 08/06/2016 134* 135 - 145 mmol/L Final  . Potassium 08/06/2016 3.5  3.5 - 5.1 mmol/L Final  . Chloride 08/06/2016 99* 101 - 111 mmol/L Final  . CO2 08/06/2016 26  22 - 32 mmol/L Final  . Glucose, Bld 08/06/2016 155* 65 - 99 mg/dL Final  . BUN 08/06/2016 10  6 - 20 mg/dL Final  . Creatinine, Ser 08/06/2016 0.74  0.44 - 1.00 mg/dL Final  . Calcium 08/06/2016 8.9  8.9 - 10.3 mg/dL Final  . Total Protein 08/06/2016 6.9  6.5 - 8.1 g/dL Final  . Albumin 08/06/2016 3.8  3.5 - 5.0 g/dL Final  . AST 08/06/2016 30  15 - 41 U/L Final  . ALT 08/06/2016 41  14 - 54 U/L Final  . Alkaline Phosphatase 08/06/2016 86  38 - 126 U/L Final  . Total Bilirubin 08/06/2016 1.2  0.3 - 1.2 mg/dL Final  . GFR calc non Af Amer 08/06/2016 >60  >60 mL/min Final  . GFR calc Af Amer 08/06/2016 >60  >60 mL/min Final   Comment: (NOTE) The eGFR has been  calculated using the CKD EPI equation. This calculation has not been validated in all clinical situations. eGFR's persistently <60 mL/min signify possible Chronic Kidney Disease.   . Anion gap 08/06/2016 9  5 - 15 Final  . B Natriuretic Peptide 08/06/2016 406.0* 0.0 - 100.0 pg/mL Final  . Troponin I 08/06/2016 <0.03  <0.03 ng/mL Final  . Lactic Acid, Venous 08/06/2016 1.6  0.5 - 1.9 mmol/L Final  . WBC 08/06/2016 15.0* 3.6 - 11.0 K/uL Final  . RBC 08/06/2016 4.67  3.80 - 5.20 MIL/uL Final  . Hemoglobin 08/06/2016 14.2  12.0 - 16.0 g/dL Final  . HCT 08/06/2016 40.7  35.0 - 47.0 % Final  . MCV 08/06/2016 87.2  80.0 - 100.0 fL Final  . MCH 08/06/2016 30.3  26.0 - 34.0 pg Final  . MCHC 08/06/2016 34.8  32.0 - 36.0 g/dL Final  . RDW 08/06/2016 14.0  11.5 - 14.5 % Final  . Platelets 08/06/2016 378  150 - 440 K/uL Final  . Neutrophils Relative % 08/06/2016 92  % Final  . Neutro Abs 08/06/2016 13.7* 1.4 - 6.5 K/uL Final  . Lymphocytes Relative 08/06/2016 2  % Final  . Lymphs Abs 08/06/2016 0.3* 1.0 - 3.6 K/uL Final  . Monocytes Relative 08/06/2016 5  % Final  . Monocytes Absolute 08/06/2016 0.7  0.2 - 0.9 K/uL Final  . Eosinophils Relative 08/06/2016 1  % Final  . Eosinophils Absolute 08/06/2016 0.2  0 - 0.7 K/uL Final  . Basophils Relative 08/06/2016 0  % Final  . Basophils Absolute 08/06/2016 0.0  0 - 0.1 K/uL Final  . Color,  Urine 08/06/2016 YELLOW* YELLOW Final  . APPearance 08/06/2016 CLEAR* CLEAR Final  . Glucose, UA 08/06/2016 NEGATIVE  NEGATIVE mg/dL Final  . Bilirubin Urine 08/06/2016 NEGATIVE  NEGATIVE Final  . Ketones, ur 08/06/2016 NEGATIVE  NEGATIVE mg/dL Final  . Specific Gravity, Urine 08/06/2016 1.010  1.005 - 1.030 Final  . Hgb urine dipstick 08/06/2016 NEGATIVE  NEGATIVE Final  . pH 08/06/2016 5.0  5.0 - 8.0 Final  . Protein, ur 08/06/2016 >500* NEGATIVE mg/dL Final  . Nitrite 08/06/2016 NEGATIVE  NEGATIVE Final  . Leukocytes, UA 08/06/2016 NEGATIVE  NEGATIVE Final  .  RBC / HPF 08/06/2016 NONE SEEN  0 - 5 RBC/hpf Final  . WBC, UA 08/06/2016 0-5  0 - 5 WBC/hpf Final  . Bacteria, UA 08/06/2016 NONE SEEN  NONE SEEN Final  . Squamous Epithelial / LPF 08/06/2016 0-5* NONE SEEN Final  . Mucus 08/06/2016 PRESENT   Final  . Glucose-Capillary 08/06/2016 234* 65 - 99 mg/dL Final  . Sodium 08/07/2016 134* 135 - 145 mmol/L Final  . Potassium 08/07/2016 3.7  3.5 - 5.1 mmol/L Final  . Chloride 08/07/2016 100* 101 - 111 mmol/L Final  . CO2 08/07/2016 26  22 - 32 mmol/L Final  . Glucose, Bld 08/07/2016 276* 65 - 99 mg/dL Final  . BUN 08/07/2016 20  6 - 20 mg/dL Final  . Creatinine, Ser 08/07/2016 0.85  0.44 - 1.00 mg/dL Final  . Calcium 08/07/2016 8.8* 8.9 - 10.3 mg/dL Final  . GFR calc non Af Amer 08/07/2016 >60  >60 mL/min Final  . GFR calc Af Amer 08/07/2016 >60  >60 mL/min Final   Comment: (NOTE) The eGFR has been calculated using the CKD EPI equation. This calculation has not been validated in all clinical situations. eGFR's persistently <60 mL/min signify possible Chronic Kidney Disease.   . Anion gap 08/07/2016 8  5 - 15 Final  . WBC 08/07/2016 17.6* 3.6 - 11.0 K/uL Final  . RBC 08/07/2016 4.45  3.80 - 5.20 MIL/uL Final  . Hemoglobin 08/07/2016 13.6  12.0 - 16.0 g/dL Final  . HCT 08/07/2016 38.6  35.0 - 47.0 % Final  . MCV 08/07/2016 86.8  80.0 - 100.0 fL Final  . MCH 08/07/2016 30.6  26.0 - 34.0 pg Final  . MCHC 08/07/2016 35.3  32.0 - 36.0 g/dL Final  . RDW 08/07/2016 14.1  11.5 - 14.5 % Final  . Platelets 08/07/2016 402  150 - 440 K/uL Final    Assessment:  Angelica Juarez is a 77 y.o. female with polycythemia.  She has had sleep apnea x 7 years and uses CPAP.  She has a 40 pack year smoking history (stopped 13 years ago).  She denies and cardiac history.  CBC on 07/26/2018 revealed a hematocrit of 49.3, hemoglobin 16.5, MCV 88.2, platelets 520,000, WBC 15,500 with an ANC of 12,958.    Symptomatically, she has lost 30 pounds in the past 6 months  secondary to family care related issues.  She denies erythromelalgia.  Plan: 1. Labs today;  CBC with diff, epo level, carbon monoxide, JAK2 with reflex to CALR, exon 12-15, MPL, and BCR-ABL. 2. Polycythemia  Discuss primary (polycythemia rubra vera-PV) and secondary (cardiac and pulmonary disease, smoking, sleep apnea, exogenous testosterone, carbone monoxide exposure, erythropoietin secreting tumors) causes.   Discuss need for further workup.   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   If PV confirmed, discussed the need for bone marrow sampling, therapeutic phlebotomy  program, and the initiation of hydroxyurea.  3.  RTC in 2 weeks for MD assessment and review of workup.    Honor Loh, NP  08/08/2018, 12:14 PM   .I saw and evaluated the patient, participating in the key portions of the service and reviewing pertinent diagnostic studies and records.  I reviewed the nurse practitioner's note and agree with the findings and the plan.  The assessment and plan were discussed with the patient. Multiple questions were asked by the patient and answered.   Nolon Stalls, MD 08/08/2018,12:14 PM

## 2018-08-08 ENCOUNTER — Inpatient Hospital Stay: Payer: Medicare Other | Attending: Hematology and Oncology | Admitting: Hematology and Oncology

## 2018-08-08 ENCOUNTER — Encounter: Payer: Self-pay | Admitting: Hematology and Oncology

## 2018-08-08 ENCOUNTER — Ambulatory Visit: Payer: PRIVATE HEALTH INSURANCE

## 2018-08-08 ENCOUNTER — Other Ambulatory Visit: Payer: Self-pay | Admitting: Hematology and Oncology

## 2018-08-08 VITALS — BP 157/80 | HR 67 | Temp 94.4°F | Resp 18 | Ht 67.0 in | Wt 224.2 lb

## 2018-08-08 DIAGNOSIS — M7918 Myalgia, other site: Secondary | ICD-10-CM | POA: Diagnosis not present

## 2018-08-08 DIAGNOSIS — Z853 Personal history of malignant neoplasm of breast: Secondary | ICD-10-CM | POA: Insufficient documentation

## 2018-08-08 DIAGNOSIS — Z7982 Long term (current) use of aspirin: Secondary | ICD-10-CM | POA: Insufficient documentation

## 2018-08-08 DIAGNOSIS — Z923 Personal history of irradiation: Secondary | ICD-10-CM | POA: Diagnosis not present

## 2018-08-08 DIAGNOSIS — D45 Polycythemia vera: Secondary | ICD-10-CM | POA: Insufficient documentation

## 2018-08-08 DIAGNOSIS — R634 Abnormal weight loss: Secondary | ICD-10-CM | POA: Diagnosis not present

## 2018-08-08 DIAGNOSIS — Z87891 Personal history of nicotine dependence: Secondary | ICD-10-CM | POA: Diagnosis not present

## 2018-08-08 DIAGNOSIS — N189 Chronic kidney disease, unspecified: Secondary | ICD-10-CM | POA: Insufficient documentation

## 2018-08-08 DIAGNOSIS — Z8589 Personal history of malignant neoplasm of other organs and systems: Secondary | ICD-10-CM | POA: Diagnosis not present

## 2018-08-08 DIAGNOSIS — I129 Hypertensive chronic kidney disease with stage 1 through stage 4 chronic kidney disease, or unspecified chronic kidney disease: Secondary | ICD-10-CM | POA: Diagnosis not present

## 2018-08-08 DIAGNOSIS — D751 Secondary polycythemia: Secondary | ICD-10-CM | POA: Diagnosis not present

## 2018-08-08 DIAGNOSIS — E785 Hyperlipidemia, unspecified: Secondary | ICD-10-CM | POA: Diagnosis not present

## 2018-08-08 DIAGNOSIS — G4730 Sleep apnea, unspecified: Secondary | ICD-10-CM | POA: Diagnosis not present

## 2018-08-08 DIAGNOSIS — Z79899 Other long term (current) drug therapy: Secondary | ICD-10-CM | POA: Insufficient documentation

## 2018-08-08 DIAGNOSIS — Z7984 Long term (current) use of oral hypoglycemic drugs: Secondary | ICD-10-CM | POA: Diagnosis not present

## 2018-08-08 DIAGNOSIS — J449 Chronic obstructive pulmonary disease, unspecified: Secondary | ICD-10-CM | POA: Insufficient documentation

## 2018-08-08 DIAGNOSIS — Z9221 Personal history of antineoplastic chemotherapy: Secondary | ICD-10-CM | POA: Diagnosis not present

## 2018-08-08 DIAGNOSIS — I10 Essential (primary) hypertension: Secondary | ICD-10-CM | POA: Insufficient documentation

## 2018-08-08 DIAGNOSIS — E1122 Type 2 diabetes mellitus with diabetic chronic kidney disease: Secondary | ICD-10-CM | POA: Diagnosis not present

## 2018-08-08 DIAGNOSIS — G4733 Obstructive sleep apnea (adult) (pediatric): Secondary | ICD-10-CM | POA: Diagnosis not present

## 2018-08-08 LAB — CBC WITH DIFFERENTIAL/PLATELET
Basophils Absolute: 0.1 10*3/uL (ref 0–0.1)
Basophils Relative: 1 %
Eosinophils Absolute: 0.6 10*3/uL (ref 0–0.7)
Eosinophils Relative: 4 %
HCT: 45.5 % (ref 35.0–47.0)
Hemoglobin: 15.3 g/dL (ref 12.0–16.0)
Lymphocytes Relative: 5 %
Lymphs Abs: 0.8 10*3/uL — ABNORMAL LOW (ref 1.0–3.6)
MCH: 29.6 pg (ref 26.0–34.0)
MCHC: 33.7 g/dL (ref 32.0–36.0)
MCV: 87.9 fL (ref 80.0–100.0)
Monocytes Absolute: 1.3 10*3/uL — ABNORMAL HIGH (ref 0.2–0.9)
Monocytes Relative: 8 %
Neutro Abs: 13.8 10*3/uL — ABNORMAL HIGH (ref 1.4–6.5)
Neutrophils Relative %: 82 %
Platelets: 552 10*3/uL — ABNORMAL HIGH (ref 150–440)
RBC: 5.18 MIL/uL (ref 3.80–5.20)
RDW: 13.8 % (ref 11.5–14.5)
WBC: 16.7 10*3/uL — ABNORMAL HIGH (ref 3.6–11.0)

## 2018-08-08 NOTE — Progress Notes (Signed)
Patient is here as new evaluation for iron deficiency anemia.  Referred by Dr. Holley Raring.

## 2018-08-09 LAB — ERYTHROPOIETIN: Erythropoietin: 2.9 m[IU]/mL (ref 2.6–18.5)

## 2018-08-09 LAB — CARBON MONOXIDE, BLOOD (PERFORMED AT REF LAB): CARBON MONOXIDE, BLOOD: 3 % (ref 0.0–3.6)

## 2018-08-13 DIAGNOSIS — Z23 Encounter for immunization: Secondary | ICD-10-CM | POA: Diagnosis not present

## 2018-08-13 LAB — JAK2 V617F, W REFLEX TO CALR/E12/MPL

## 2018-08-15 ENCOUNTER — Ambulatory Visit: Payer: Self-pay | Admitting: Internal Medicine

## 2018-08-15 DIAGNOSIS — J301 Allergic rhinitis due to pollen: Secondary | ICD-10-CM | POA: Diagnosis not present

## 2018-08-16 ENCOUNTER — Encounter: Payer: Self-pay | Admitting: Internal Medicine

## 2018-08-16 ENCOUNTER — Ambulatory Visit (INDEPENDENT_AMBULATORY_CARE_PROVIDER_SITE_OTHER): Payer: Medicare Other | Admitting: Internal Medicine

## 2018-08-16 VITALS — BP 130/58 | HR 63 | Resp 16 | Ht 67.0 in | Wt 226.8 lb

## 2018-08-16 DIAGNOSIS — J449 Chronic obstructive pulmonary disease, unspecified: Secondary | ICD-10-CM | POA: Diagnosis not present

## 2018-08-16 DIAGNOSIS — D751 Secondary polycythemia: Secondary | ICD-10-CM | POA: Diagnosis not present

## 2018-08-16 DIAGNOSIS — R0602 Shortness of breath: Secondary | ICD-10-CM

## 2018-08-16 DIAGNOSIS — G4733 Obstructive sleep apnea (adult) (pediatric): Secondary | ICD-10-CM | POA: Diagnosis not present

## 2018-08-16 DIAGNOSIS — J301 Allergic rhinitis due to pollen: Secondary | ICD-10-CM

## 2018-08-16 MED ORDER — FLUTICASONE-UMECLIDIN-VILANT 100-62.5-25 MCG/INH IN AEPB: 1.0000 | INHALATION_SPRAY | Freq: Every day | RESPIRATORY_TRACT | 4 refills | Status: DC

## 2018-08-16 NOTE — Progress Notes (Signed)
[facility] [place], [place] [address]  Pulmonary Sleep Medicine   Office Visit Note  Patient Name: Angelica Juarez DOB: [birth year] MRN [id number]  Date of Service: 08/16/2018  Complaints/HPI: She continues to have some DOE. She states that she has been on anora and a rescue inhaler. Patient also does have a nebs that she uses. She has not been on steroids. She has no cough noted at this time. Denies having chest pain at this time. She has no sputum production noted. She is also seeing nephrology and heme for abnormal blood tests. No admissions to the hospital are noted  ROS  General: (-) fever, (-) chills, (-) night sweats, (-) weakness Skin: (-) rashes, (-) itching,. Eyes: (-) visual changes, (-) redness, (-) itching. Nose and Sinuses: (-) nasal stuffiness or itchiness, (-) postnasal drip, (-) nosebleeds, (-) sinus trouble. Mouth and Throat: (-) sore throat, (-) hoarseness. Neck: (-) swollen glands, (-) enlarged thyroid, (-) neck pain. Respiratory: - cough, (-) bloody sputum, + shortness of breath, - wheezing. Cardiovascular: - ankle swelling, (-) chest pain. Lymphatic: (-) lymph node enlargement. Neurologic: (-) numbness, (-) tingling. Psychiatric: (-) anxiety, (-) depression   Current Medication: Outpatient Encounter Medications as of 08/16/2018  Medication Sig  . albuterol (ACCUNEB) 1.25 MG/3ML nebulizer solution Take 1 ampule by nebulization every 6 (six) hours as needed. wheezing  . albuterol (ACCUNEB) 1.25 MG/3ML nebulizer solution USE 1 VIAL VIA NEBULIZER EVERY 6 HOURS AS NEEDED FOR WHEEZING  . albuterol (PROVENTIL HFA;VENTOLIN HFA) 108 (90 Base) MCG/ACT inhaler Inhale into the lungs as needed.  Marland Kitchen aspirin 81 MG tablet Take 81 mg by mouth daily.  Marland Kitchen atorvastatin (LIPITOR) 40 MG tablet Take 40 mg by mouth daily.  . B Complex Vitamins (VITAMIN B COMPLEX PO) Take 1 tablet by mouth daily.  . Calcium Carbonate-Vitamin D 600-200 MG-UNIT CAPS Take 1 capsule  by mouth daily.  . cloNIDine (CATAPRES) 0.2 MG tablet Take 0.2 mg by mouth 2 (two) times daily.  . diphenhydrAMINE (BENADRYL) 25 mg capsule Take 25 mg by mouth daily.  Marland Kitchen EPINEPHrine (EPIPEN 2-PAK) 0.3 mg/0.3 mL IJ SOAJ injection 0.3 mg once. Reported on 05/25/2016  . fluticasone (FLONASE) 50 MCG/ACT nasal spray Place 2 sprays into both nostrils daily.  . furosemide (LASIX) 20 MG tablet Take 1 tablet (20 mg total) by mouth 2 (two) times daily.  Marland Kitchen glucose blood (ONE TOUCH ULTRA TEST) test strip USE ONE STRIP TO CHECK GLUCOSE ONCE DAILY  . hydrALAZINE (APRESOLINE) 100 MG tablet Take 100 mg by mouth 3 (three) times daily.  Marland Kitchen levETIRAcetam (KEPPRA) 750 MG tablet Take 750 mg by mouth 2 (two) times daily.  . metFORMIN (GLUCOPHAGE) 500 MG tablet Take 500 mg by mouth 2 (two) times daily with a meal.  . metoprolol succinate (TOPROL-XL) 100 MG 24 hr tablet Take 100 mg by mouth daily.  . montelukast (SINGULAIR) 10 MG tablet TAKE 1 TABLET BY MOUTH EVERY DAY  . Multiple Vitamin (MULTI-VITAMINS) TABS Take 1 tablet by mouth daily.  . Multiple Vitamins-Minerals (MULTIVITAMIN WITH MINERALS) tablet Take 1 tablet by mouth daily.  . Multiple Vitamins-Minerals (PRESERVISION AREDS 2+MULTI VIT PO) Take by mouth.  Marland Kitchen NIFEdipine (NIFEDICAL XL) 30 MG 24 hr tablet Take 30 mg by mouth daily.  Marland Kitchen OVER THE COUNTER MEDICATION Place 1 drop into both eyes daily. Allergy eye drops  . sitaGLIPtin (JANUVIA) 25 MG tablet Take 1 tablet by mouth daily.  . traZODone (DESYREL) 50 MG tablet Take 50-100 mg by  mouth at bedtime as needed. Sleep  . umeclidinium-vilanterol (ANORO ELLIPTA) 62.5-25 MCG/INH AEPB Inhale 1 puff into the lungs daily.  . valsartan-hydrochlorothiazide (DIOVAN-HCT) 320-25 MG tablet Take 1 tablet by mouth daily.  . vitamin C (ASCORBIC ACID) 500 MG tablet Take 500 mg by mouth 2 (two) times daily.   No facility-administered encounter medications on file as of 08/16/2018.     Surgical History: Past Surgical History:   Procedure Laterality Date  . ABDOMINAL HYSTERECTOMY    . 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 EYE SURGERY     bilateral cataracts  . JOINT REPLACEMENT     bilateral knee replacement  . NASAL SEPTUM SURGERY    . SHOULDER ARTHROSCOPY WITH BICEPSTENOTOMY Right 05/25/2016   Procedure: SHOULDER ARTHROSCOPY WITH BICEPSTENOTOMY;  Surgeon: Leanor Kail, MD;  Location: [facility] ORS;  Service: Orthopedics;  Laterality: Right;  . SHOULDER ARTHROSCOPY WITH DISTAL CLAVICLE RESECTION Right 05/25/2016   Procedure: SHOULDER ARTHROSCOPY WITH DISTAL CLAVICLE RESECTION;  Surgeon: Leanor Kail, MD;  Location: [facility] ORS;  Service: Orthopedics;  Laterality: Right;  . SHOULDER ARTHROSCOPY WITH OPEN ROTATOR CUFF REPAIR Right 05/25/2016   Procedure: SHOULDER ARTHROSCOPY WITH OPEN ROTATOR CUFF REPAIR;  Surgeon: Leanor Kail, MD;  Location: [facility] ORS;  Service: Orthopedics;  Laterality: Right;  . SUBACROMIAL DECOMPRESSION Right 05/25/2016   Procedure: SUBACROMIAL DECOMPRESSION;  Surgeon: Leanor Kail, MD;  Location: [facility] ORS;  Service: Orthopedics;  Laterality: Right;    Medical History: Past Medical History:  Diagnosis Date  . Allergy    Seasonal  . Arthritis   . 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 Chronic kidney disease    chronic renal insufficiency  . Colon polyps 05/21/14  . COPD (chronic obstructive pulmonary disease) ([place])   . Diabetes mellitus without complication ([place])   . Fracture closed, humerus, shaft    right   . Hyperlipidemia 04/30/14  . Hypertension   . Impingement syndrome of right shoulder 12/29/15  . Personal history of radiation therapy   . Pneumonia    hx  . Rotator cuff tear 04/21/16   right  . Seizures ([place])   . Sleep apnea     Family History: Family History  Problem Relation Age of Onset  . Breast cancer Paternal Aunt 51  . Diabetes Father   . Heart disease Father     Social History: Social History   Socioeconomic History  . Marital  status: Single    Spouse name: Not on file  . Number of children: Not on file  . Years of education: Not on file  . Highest education level: Not on file  Occupational History  . Not on file  Social Needs  . Financial resource strain: Not on file  . Food insecurity:    Worry: Not on file    Inability: Not on file  . Transportation needs:    Medical: Not on file    Non-medical: Not on file  Tobacco Use  . Smoking status: Former Smoker    Packs/day: 1.00    Years: 40.00    Pack years: 40.00    Last attempt to quit: 05/12/2005    Years since quitting: 13.2  . Smokeless tobacco: Never Used  Substance and Sexual Activity  . Alcohol use: No  . Drug use: No  . Sexual activity: Not on file  Lifestyle  . Physical activity:    Days per week: Not on file    Minutes per session: Not  on file  . Stress: Not on file  Relationships  . Social connections:    Talks on phone: Not on file    Gets together: Not on file    Attends religious service: Not on file    Active member of club or organization: Not on file    Attends meetings of clubs or organizations: Not on file    Relationship status: Not on file  . Intimate partner violence:    Fear of current or ex partner: Not on file    Emotionally abused: Not on file    Physically abused: Not on file    Forced sexual activity: Not on file  Other Topics Concern  . Not on file  Social History Narrative  . Not on file    Vital Signs: Blood pressure (!) 130/58, pulse 63, resp. rate 16, height 5\' 7"  (1.702 m), weight 226 lb 12.8 oz (102.9 kg), SpO2 91 %.  Examination: General Appearance: The patient is well-developed, well-nourished, and in no distress. Skin: Gross inspection of skin unremarkable. Head: normocephalic, no gross deformities. Eyes: no gross deformities noted. ENT: ears appear grossly normal no exudates. Neck: Supple. No thyromegaly. No LAD. Respiratory: no rhonchi are noted at this time. Cardiovascular: Normal S1 and S2  without murmur or rub. Extremities: No cyanosis. pulses are equal. Neurologic: Alert and oriented. No involuntary movements.  LABS: Recent Results (from the past 2160 hour(s))  Erythropoietin     Status: None   Collection Time: 08/08/18 12:21 PM  Result Value Ref Range   Erythropoietin 2.9 2.6 - 18.5 mIU/mL    Comment: (NOTE) Beckman Coulter UniCel DxI 800 Immunoassay System Values obtained with different assay methods or kits cannot be used interchangeably. Results cannot be interpreted as absolute evidence of the presence or absence of malignant disease. Performed At: [facility] [place], [place] [id number] Rush Farmer MD ZD:[id number]   CBC with Differential/Platelet     Status: Abnormal   Collection Time: 08/08/18 12:21 PM  Result Value Ref Range   WBC 16.7 (H) 3.6 - 11.0 K/uL   RBC 5.18 3.80 - 5.20 MIL/uL   Hemoglobin 15.3 12.0 - 16.0 g/dL   HCT 45.5 35.0 - 47.0 %   MCV 87.9 80.0 - 100.0 fL   MCH 29.6 26.0 - 34.0 pg   MCHC 33.7 32.0 - 36.0 g/dL   RDW 13.8 11.5 - 14.5 %   Platelets 552 (H) 150 - 440 K/uL   Neutrophils Relative % 82 %   Neutro Abs 13.8 (H) 1.4 - 6.5 K/uL   Lymphocytes Relative 5 %   Lymphs Abs 0.8 (L) 1.0 - 3.6 K/uL   Monocytes Relative 8 %   Monocytes Absolute 1.3 (H) 0.2 - 0.9 K/uL   Eosinophils Relative 4 %   Eosinophils Absolute 0.6 0 - 0.7 K/uL   Basophils Relative 1 %   Basophils Absolute 0.1 0 - 0.1 K/uL    Comment: Performed at [facility], [address], [place], [place] [address]  JAK2 V617F, w Reflex to CALR/E12/MPL     Status: Abnormal   Collection Time: 08/08/18 12:21 PM  Result Value Ref Range   JAK2 GenotypR Comment (A)     Comment: (NOTE) Result:  POSITIVE for the detection of the V617F mutation. Interpretation:  The assay detected the presence of a G to T nucleotide change encoding the V617F mutation within JAK2. Interpretation of this result should be made in the context of other  clinical, morphologic,  and cytogenetic findings.    BACKGROUND: Comment     Comment: (NOTE) JAK2 is a cytoplasmic tyrosine kinase with a key role in signal transduction from multiple hematopoietic growth factor receptors. A point mutation within exon 14 of the JAK2 gene ([id number]) encoding a valine to phenylalanine substitution at position 617 of the JAK2 protein (V617F) has been identified in most patients with polycythemia vera, and in about half of those with either essential thrombocythemia or idiopathic myelofibrosis. The V617F has also been detected, although infrequently, in other myeloid disorders such as chronic myelomonocytic leukemia and chronic neutrophilic luekemia. V617F is an acquired mutation that alters a highly conserved valine present in the negative regulatory JH2 domain of the JAK2 protein and is predicted to dysregulate kinase activity. Methodology: Total genomic DNA was extracted and subjected to TaqMan real-time PCR amplification/detection. Two amplification products per sample were monitored by real-time PCR using primers/probes s pecific to JAK2 wild type (WT) and JAK2 mutant V617F. The ABI7900 Absolute Quantitation software will compare the patient specimen valuse to the standard curves and generate percent values for wild type and mutant type. In vitro studies have indicated that this assay has an analytical sensitivity of 1%. References: Baxter EJ, Scott Phineas Real, et al. Acquired mutation of the tyrosine kinase JAK2 in human myeloproliferative disorders. Lancet. 2005 Mar 19-25; 365(9464):1054-1061. Alfonso Ramus Couedic JP. A unique clonal JAK2 mutation leading to constitutive signaling causes polycythaemia vera. Nature. 2005 Apr 28; 434(7037):1144-1148. Kralovics R, Passamonti F, Buser AS, et al. A gain-of-function mutation of JAK2 in myeloproliferative disorders. N Engl J Med. 2005 Apr 28; 352(17):1779-1790.    Director Review, JAK2  Comment     Comment: (NOTE) Katina Degree, MD, PhD Director, [facility] [place], [place] [address] [phone] This test was developed and its performance characteristics determined by [facility]. It has not been cleared or approved by the Food and Drug Administration.    REFLEX: Comment     Comment: (NOTE) Reflex to CALR Mutation Analysis, JAK2 Exon 12-15 Mutation Analysis, and MPL Mutation Analysis is not indicated.    Extraction Completed     Comment: (NOTE) Performed At: [facility] [address] [place], [place] [id number] Nechama Guard MD [id number] Performed At: [facility] [address] [place], [place] [id number] Nechama Guard MD [id number]   Carbon monoxide, blood (performed at ref lab)     Status: None   Collection Time: 08/08/18 12:21 PM  Result Value Ref Range   Carbon Monoxide, Blood 3.0 0.0 - 3.6 %    Comment: (NOTE)                            Environmental Exposure:                             Nonsmokers           <3.7                             Smokers              <9.9                            Occupational Exposure:  BEI                   3.5                                Detection Limit =  0.2 Performed At: [facility] [place], [place] [id number] Rush Farmer MD [id number]     Radiology: Mm Digital Screening Bilateral  Result Date: 01/23/2018 CLINICAL DATA:  Screening. EXAM: DIGITAL SCREENING BILATERAL MAMMOGRAM WITH CAD COMPARISON:  Previous exam(s). ACR Breast Density Category b: There are scattered areas of fibroglandular density. FINDINGS: There are no findings suspicious for malignancy. Images were processed with CAD. IMPRESSION: No mammographic evidence of malignancy. A result letter of this screening mammogram will be mailed directly to the patient. RECOMMENDATION: Screening mammogram in one  year. (Code:SM-B-01Y) BI-RADS CATEGORY  1: Negative. Electronically Signed   By: Lajean Manes M.D.   On: 01/23/2018 10:43    No results found.  No results found.    Assessment and Plan: Patient Active Problem List   Diagnosis Date Noted  . Polycythemia 08/08/2018  . History of repair of right rotator cuff 08/25/2016  . Acute respiratory failure with hypoxia ([place]) 08/06/2016  . Nontraumatic tear of right rotator cuff 04/21/2016  . Atypical chest pain 04/07/2016  . Impingement syndrome of right shoulder 12/29/2015  . Primary osteoarthritis of first carpometacarpal joint of left hand 10/27/2015  . Right shoulder pain 10/27/2015  . OSA on CPAP 10/14/2015  . COPD with asthma ([place]) 07/13/2015  . Type 2 diabetes mellitus with microalbuminuria, without long-term current use of insulin ([place]) 07/13/2015  . Essential hypertension 03/05/2015  . Difficulty sleeping 11/26/2014  . CRI (chronic renal insufficiency) 07/31/2014  . Seizure disorder ([place]) 07/31/2014  . History of colonic polyps 05/21/2014  . Hyperlipidemia, unspecified 04/30/2014    1. SOB/COPD patient will be changed to trelegy inhaler. Patient is currently not on oxygen will check a 50mw today follow up PFT ordered 2. OSA on CPAP will continue also may consider to check overnight ox on CPAP 3. Polycythemia likely secondary may be pulmonary related also with her history of OSA this could also explain the polycythemia being worked up right now by heme 4. Allergic rhinitis will continue with nasal inhalers  General Counseling: I have discussed the findings of the evaluation and examination with Arbie Cookey.  I have also discussed any further diagnostic evaluation thatmay be needed or ordered today. Nadiyah verbalizes understanding of the findings of todays visit. We also reviewed her medications today and discussed drug interactions and side effects including but not limited excessive drowsiness and altered mental states. We also discussed  that there is always a risk not just to her but also people around her. she has been encouraged to call the office with any questions or concerns that should arise related to todays visit.    Time spent: 56min  I have personally obtained a history, examined the patient, evaluated laboratory and imaging results, formulated the assessment and plan and placed orders.    Allyne Gee, MD [facility] Pulmonary and Critical Care Sleep medicine

## 2018-08-16 NOTE — Patient Instructions (Signed)

## 2018-08-18 LAB — BCR-ABL1 FISH
Cells Analyzed: 200
Cells Counted: 200

## 2018-08-22 ENCOUNTER — Inpatient Hospital Stay: Payer: Medicare Other | Attending: Hematology and Oncology | Admitting: Hematology and Oncology

## 2018-08-22 ENCOUNTER — Encounter: Payer: Self-pay | Admitting: Hematology and Oncology

## 2018-08-22 VITALS — BP 155/74 | HR 88 | Temp 97.7°F | Resp 18 | Ht 67.0 in | Wt 228.4 lb

## 2018-08-22 DIAGNOSIS — I129 Hypertensive chronic kidney disease with stage 1 through stage 4 chronic kidney disease, or unspecified chronic kidney disease: Secondary | ICD-10-CM | POA: Insufficient documentation

## 2018-08-22 DIAGNOSIS — D45 Polycythemia vera: Secondary | ICD-10-CM | POA: Insufficient documentation

## 2018-08-22 DIAGNOSIS — J449 Chronic obstructive pulmonary disease, unspecified: Secondary | ICD-10-CM | POA: Diagnosis not present

## 2018-08-22 DIAGNOSIS — Z86010 Personal history of colonic polyps: Secondary | ICD-10-CM | POA: Diagnosis not present

## 2018-08-22 DIAGNOSIS — R0602 Shortness of breath: Secondary | ICD-10-CM | POA: Diagnosis not present

## 2018-08-22 DIAGNOSIS — E119 Type 2 diabetes mellitus without complications: Secondary | ICD-10-CM | POA: Diagnosis not present

## 2018-08-22 DIAGNOSIS — E785 Hyperlipidemia, unspecified: Secondary | ICD-10-CM | POA: Insufficient documentation

## 2018-08-22 DIAGNOSIS — Z87891 Personal history of nicotine dependence: Secondary | ICD-10-CM | POA: Insufficient documentation

## 2018-08-22 DIAGNOSIS — N183 Chronic kidney disease, stage 3 (moderate): Secondary | ICD-10-CM | POA: Diagnosis not present

## 2018-08-22 DIAGNOSIS — Z7982 Long term (current) use of aspirin: Secondary | ICD-10-CM

## 2018-08-22 DIAGNOSIS — Z1589 Genetic susceptibility to other disease: Secondary | ICD-10-CM

## 2018-08-22 DIAGNOSIS — Z79899 Other long term (current) drug therapy: Secondary | ICD-10-CM | POA: Diagnosis not present

## 2018-08-22 DIAGNOSIS — M129 Arthropathy, unspecified: Secondary | ICD-10-CM | POA: Diagnosis not present

## 2018-08-22 NOTE — Patient Instructions (Addendum)
Hydroxyurea capsules What is this medicine? HYDROXYUREA (hye drox ee yoor EE a) is a chemotherapy drug. This medicine is used to treat certain types of leukemias and head and neck cancer. It is also used to control the painful crises of sickle cell anemia. This medicine may be used for other purposes; ask your health care provider or pharmacist if you have questions. COMMON BRAND NAME(S): Droxia, Hydrea What should I tell my health care provider before I take this medicine? They need to know if you have any of these conditions: -gout or high levels of uric acid in the blood -HIV or AIDS -kidney disease or on hemodialysis -leg wounds or ulcers -low blood counts, like low white cell, platelet, or red cell counts -prior or current interferon therapy -recent or ongoing radiation therapy -scheduled to receive a vaccine -an unusual or allergic reaction to hydroxyurea, other medicines, foods, dyes, or preservatives -pregnant or trying to get pregnant -breast-feeding How should I use this medicine? Take this medicine by mouth with a glass of water. Follow the directions on the prescription label. Take your medicine at regular intervals. Do not take it more often than directed. Do not stop taking except on your doctor's advice. People who are not taking this medicine should not be exposed to it. Wash your hands before and after handling your bottle or medicine. Caregivers should wear disposable gloves if they must touch the bottle or medicine. Clean up any medicine powder that spills with a damp disposable towel and throw the towel away in a closed container, such as a plastic bag. A special MedGuide will be given to you by the pharmacist with each prescription and refill of Droxyia. Be sure to read this information carefully each time. Talk to your pediatrician regarding the use of this medicine in children. Special care may be needed. Overdosage: If you think you have taken too much of this medicine  contact a poison control center or emergency room at once. NOTE: This medicine is only for you. Do not share this medicine with others. What if I miss a dose? If you miss a dose, take it as soon as you can. If it is almost time for your next dose, take only that dose. Do not take double or extra doses. What may interact with this medicine? This medicine may also interact with the following medications: -didanosine -stavudine -live virus vaccines This list may not describe all possible interactions. Give your health care provider a list of all the medicines, herbs, non-prescription drugs, or dietary supplements you use. Also tell them if you smoke, drink alcohol, or use illegal drugs. Some items may interact with your medicine. What should I watch for while using this medicine? This drug may make you feel generally unwell. This is not uncommon, as chemotherapy can affect healthy cells as well as cancer cells. Report any side effects. Continue your course of treatment even though you feel ill unless your doctor tells you to stop. You will receive regular blood tests during your treatment. Call your doctor or health care professional for advice if you get a fever, chills or sore throat, or other symptoms of a cold or flu. Do not treat yourself. This drug decreases your body's ability to fight infections. Try to avoid being around people who are sick. This medicine may increase your risk to bruise or bleed. Call your doctor or health care professional if you notice any unusual bleeding. Talk to your doctor about your risk of cancer. You may  be more at risk for certain types of cancers if you take this medicine. Keep out of the sun. If you cannot avoid being in the sun, wear protective clothing and use sunscreen. Do not use sun lamps or tanning beds/booths. Do not become pregnant while taking this medicine or for at least 6 months after stopping it. Women should inform their doctor if they wish to become  pregnant or think they might be pregnant. Men should not father a child while taking this medicine and for at least a year after stopping it. There is a potential for serious side effects to an unborn child. Talk to your health care professional or pharmacist for more information. Do not breast-feed an infant while taking this medicine. This may interfere with the ability to have or father a child. You should talk with your doctor or health care professional if you are concerned about your fertility. What side effects may I notice from receiving this medicine? Side effects that you should report to your doctor or health care professional as soon as possible: -allergic reactions like skin rash, itching or hives, swelling of the face, lips, or tongue -breathing problems -burning, redness or pain at the site of any radiation therapy -low blood counts - this medicine may decrease the number of white blood cells, red blood cells and platelets. You may be at increased risk for infections and bleeding. -signs of decreased platelets or bleeding - bruising, pinpoint red spots on the skin, black, tarry stools, blood in the urine -signs of decreased red blood cells - unusually weak or tired, fainting spells, lightheadedness -signs of infection - fever or chills, cough, sore throat, pain or difficulty passing urine -signs and symptoms of bleeding such as bloody or black, tarry stools; red or dark-brown urine; spitting up blood or brown material that looks like coffee grounds; red spots on the skin; unusual bruising or bleeding from the eye, gums, or nose -skin ulcers Side effects that usually do not require medical attention (report to your doctor or health care professional if they continue or are bothersome): -constipation -diarrhea -loss of appetite -mouth sores -nausea This list may not describe all possible side effects. Call your doctor for medical advice about side effects. You may report side effects  to FDA at 1-800-FDA-1088. Where should I keep my medicine? Keep out of the reach of children. See product for storage instructions. Each product may have different instructions. Keep tightly closed. Throw away any unused medicine after the expiration date. NOTE: This sheet is a summary. It may not cover all possible information. If you have questions about this medicine, talk to your doctor, pharmacist, or health care provider.  2018 Elsevier/Gold Standard (2016-11-03 11:43:13) Polycythemia Vera Polycythemia vera (PV), or myeloproliferative disease, is a form of blood cancer in which the bone marrow makes too many (overproduces) red blood cells. The bone marrow may also make too many clotting cells (platelets) and white blood cells. Bone marrow is the spongy center of bones where blood cells are produced. Sometimes, there may be an overproduction of blood cells in the liver and spleen, causing those organs to become enlarged. Additionally, people who have PV are at a higher risk for stroke or heart attack because their blood may clot more easily. PV is a long-term disease. What are the causes? Almost all people who have PV have an abnormal gene (genetic mutation) that causes changes in the way that the bone marrow makes blood cells. This gene, which is called JAK2,   is not passed along from parent to child (is not hereditary). It is not known what triggers the genetic mutation that causes the body to produce too many red blood cells. What increases the risk? This condition is more likely to develop in:  Males.  People who are 60 years of age or older.  What are the signs or symptoms? You may not have any symptoms in the early stage of PV. When symptoms develop, they may include:  Shortness of breath.  Dizziness.  Hot and flushed skin.  Itchy skin.  Sweats, especially night sweats.  Headache.  Tiredness.  Ringing in the ears.  Blurred vision or blind spots.  Bone pain.  Weight  loss.  Fever.  Blood-tinged vomit or bowel movements.  How is this diagnosed? This condition may be diagnosed during a routine physical exam if you have a blood test called a complete blood count (CBC). Your health care provider also may suspect PV if you have symptoms. During the physical exam, your provider may find that you have an enlarged liver or spleen. You may also have tests to confirm the diagnosis. These may include:  A procedure to remove a sample of bone marrow for testing (bone marrow biopsy).  Blood tests to check for: ? The JAK2 gene. ? Low levels of a hormone that helps to regulate blood production (erythropoietin).  How is this treated? There is no cure for PV, but treatment can help to control the disease. There are several types of treatment. No single treatment works for everyone. You will need to work with a blood cancer specialist (hematologist) to find the treatment that is best for you. Options include:  Periodically having some blood removed with a needle (drawn) to lower the number of red blood cells (phlebotomy).  Medicine. Your health care provider may recommend: ? Low-dose aspirin to lower your risk for blood clots. ? A medicine to reduce red blood cell production (hydroxyurea). ? A medicine to lower the number of red blood cells (interferon). ? A medicine that slows down the effects of JAK2 (ruxolitinib).  Follow these instructions at home:  Take over-the-counter and prescription medicines only as told by your health care provider.  Return to your normal activities as told by your health care provider. Ask your health care provider what activities are safe for you.  Do not use tobacco products, including cigarettes, chewing tobacco, or e-cigarettes. If you need help quitting, ask your health care provider.  Keep all follow-up visits as told by your health care provider. This is important. Contact a health care provider if:  You have side effects  from your medicines.  Your symptoms change or get worse at home.  You have blood in your stool or you vomit blood. Get help right away if:  You have sudden and severe pain in your abdomen.  You have chest pain or difficulty breathing.  You have signs of stroke, such as: ? Sudden numbness. ? Weakness of your face or arm. ? Confusion. ? Difficulty speaking or understanding speech. These symptoms may represent a serious problem that is an emergency. Do not wait to see if the symptoms will go away. Get medical help right away. Call your local emergency services (911 in the [place] Do not drive yourself to the hospital. This information is not intended to replace advice given to you by your health care provider. Make sure you discuss any questions you have with your health care provider. Document Released: 07/26/2001 Document Revised: 04/07/2016   Document Reviewed: 05/13/2015 Elsevier Interactive Patient Education  2018 [employer]  

## 2018-08-22 NOTE — Progress Notes (Signed)
No new changes noted today 

## 2018-08-22 NOTE — Progress Notes (Signed)
[facility] day:  08/22/2018  Chief Complaint: Angelica Juarez is a 77 y.o. female with polycythemia who is seen for review of work-up and discussion regarding direction of therapy.  HPI:  The patient is was last seen in the hematology clinic on 08/08/2018 for initial consultation. At that time, she complained of pain in her LEFT lateral torso that was worse with movement. Patient had lost 30 pounds in the 6 months prior to her visit due to issues caring for a sick family member. Exam was grossly unremarkable.   CBC revealed a WBC of 16,700 (ANC 13,800). Hemoglobin 15.3, hematocrit 45.5, MCV 87.9, and platelets 552,000.  Erythropoietin level was normal at 2.9 mIU/mL. BCR/ABL demonstrated no BCR or ABL gene rearrangements. Carbon monoxide level was normal at 3.0% (0 - 3.6 %). 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   In the interim, patient has been doing "ok". She denies any acute concerns. She feels generally well. Patient denies that she has experienced any B symptoms. She denies any interval infections.   Patient advises that she maintains an adequate appetite. She is eating well. Weight today is 228 lb 6.3 oz (103.6 kg), which compared to her last visit to the clinic, represents a 4 pound increase.   Patient denies pain in the clinic today.   Past Medical History:  Diagnosis Date  . Allergy    Seasonal  . Arthritis   . 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 Chronic kidney disease    chronic renal insufficiency  . Colon polyps 05/21/14  . COPD (chronic obstructive pulmonary disease) ([place])   . Diabetes mellitus without complication ([place])   . Fracture closed, humerus, shaft    right   . Hyperlipidemia 04/30/14  . Hypertension   . Impingement syndrome of right shoulder 12/29/15  . Personal history of radiation therapy   . Pneumonia    hx  . Rotator cuff tear 04/21/16   right  . Seizures ([place])   . Sleep apnea     Past Surgical History:   Procedure Laterality Date  . ABDOMINAL HYSTERECTOMY    . 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 EYE SURGERY     bilateral cataracts  . JOINT REPLACEMENT     bilateral knee replacement  . NASAL SEPTUM SURGERY    . SHOULDER ARTHROSCOPY WITH BICEPSTENOTOMY Right 05/25/2016   Procedure: SHOULDER ARTHROSCOPY WITH BICEPSTENOTOMY;  Surgeon: Leanor Kail, MD;  Location: [facility] ORS;  Service: Orthopedics;  Laterality: Right;  . SHOULDER ARTHROSCOPY WITH DISTAL CLAVICLE RESECTION Right 05/25/2016   Procedure: SHOULDER ARTHROSCOPY WITH DISTAL CLAVICLE RESECTION;  Surgeon: Leanor Kail, MD;  Location: [facility] ORS;  Service: Orthopedics;  Laterality: Right;  . SHOULDER ARTHROSCOPY WITH OPEN ROTATOR CUFF REPAIR Right 05/25/2016   Procedure: SHOULDER ARTHROSCOPY WITH OPEN ROTATOR CUFF REPAIR;  Surgeon: Leanor Kail, MD;  Location: [facility] ORS;  Service: Orthopedics;  Laterality: Right;  . SUBACROMIAL DECOMPRESSION Right 05/25/2016   Procedure: SUBACROMIAL DECOMPRESSION;  Surgeon: Leanor Kail, MD;  Location: [facility] ORS;  Service: Orthopedics;  Laterality: Right;    Family History  Problem Relation Age of Onset  . Breast cancer Paternal Aunt 39  . Diabetes Father   . Heart disease Father     Social History:  reports that she quit smoking about 13 years ago. She has a 40.00 pack-year smoking history. She has never used smokeless tobacco. She reports that she does not  drink alcohol or use drugs.  Patient moved to [place] in 2001 from [place]. Patient is a former 1 ppd smoker x 40 years; quit in 2006. She is retired from [employer]. Patient denies known exposures to radiation on toxins. The patient is alone today.  Allergies:  Allergies  Allergen Reactions  . Iodinated Diagnostic Agents Anaphylaxis    Other reaction(s): Other (See Comments) Throat swells and extreme hives  . Latex Itching  . Phenobarbital Hives  . Tape Rash    silicones    Current Medications: Current Outpatient Medications   Medication Sig Dispense Refill  . aspirin 81 MG tablet Take 81 mg by mouth daily.    Marland Kitchen atorvastatin (LIPITOR) 40 MG tablet Take 40 mg by mouth daily.    . B Complex Vitamins (VITAMIN B COMPLEX PO) Take 1 tablet by mouth daily.    . Calcium Carbonate-Vitamin D 600-200 MG-UNIT CAPS Take 1 capsule by mouth daily.    . cloNIDine (CATAPRES) 0.2 MG tablet Take 0.2 mg by mouth 2 (two) times daily.    . diphenhydrAMINE (BENADRYL) 25 mg capsule Take 25 mg by mouth daily.    . furosemide (LASIX) 20 MG tablet Take 1 tablet (20 mg total) by mouth 2 (two) times daily. 60 tablet 0  . glucose blood (ONE TOUCH ULTRA TEST) test strip USE ONE STRIP TO CHECK GLUCOSE ONCE DAILY    . hydrALAZINE (APRESOLINE) 100 MG tablet Take 50 mg by mouth 3 (three) times daily.     Marland Kitchen levETIRAcetam (KEPPRA) 750 MG tablet Take 750 mg by mouth 2 (two) times daily.    . metFORMIN (GLUCOPHAGE) 500 MG tablet Take 500 mg by mouth 2 (two) times daily with a meal.    . metoprolol succinate (TOPROL-XL) 100 MG 24 hr tablet Take 100 mg by mouth daily.    . montelukast (SINGULAIR) 10 MG tablet TAKE 1 TABLET BY MOUTH EVERY DAY 90 tablet 0  . Multiple Vitamin (MULTI-VITAMINS) TABS Take 1 tablet by mouth daily.    . Multiple Vitamins-Minerals (MULTIVITAMIN WITH MINERALS) tablet Take 1 tablet by mouth daily.    Marland Kitchen NIFEdipine (NIFEDICAL XL) 30 MG 24 hr tablet Take 30 mg by mouth daily.    Marland Kitchen OVER THE COUNTER MEDICATION Place 1 drop into both eyes daily. Allergy eye drops    . sitaGLIPtin (JANUVIA) 25 MG tablet Take 1 tablet by mouth daily.    . traZODone (DESYREL) 50 MG tablet Take 50-100 mg by mouth at bedtime as needed. Sleep  0  . umeclidinium-vilanterol (ANORO ELLIPTA) 62.5-25 MCG/INH AEPB Inhale 1 puff into the lungs daily.    . valsartan-hydrochlorothiazide (DIOVAN-HCT) 320-25 MG tablet Take 1 tablet by mouth daily.  0  . vitamin C (ASCORBIC ACID) 500 MG tablet Take 500 mg by mouth 2 (two) times daily.    Marland Kitchen albuterol (ACCUNEB) 1.25 MG/3ML  nebulizer solution Take 1 ampule by nebulization every 6 (six) hours as needed. wheezing  3  . albuterol (ACCUNEB) 1.25 MG/3ML nebulizer solution USE 1 VIAL VIA NEBULIZER EVERY 6 HOURS AS NEEDED FOR WHEEZING    . albuterol (PROVENTIL HFA;VENTOLIN HFA) 108 (90 Base) MCG/ACT inhaler Inhale into the lungs as needed.    Marland Kitchen EPINEPHrine (EPIPEN 2-PAK) 0.3 mg/0.3 mL IJ SOAJ injection 0.3 mg once. Reported on 05/25/2016    . fluticasone (FLONASE) 50 MCG/ACT nasal spray Place 2 sprays into both nostrils daily. (Patient not taking: Reported on 08/22/2018) 16 g 2  . Fluticasone-Umeclidin-Vilant (TRELEGY ELLIPTA) 100-62.5-25 MCG/INH AEPB Inhale 1  puff into the lungs daily. (Patient not taking: Reported on 08/22/2018) 1 each 4  . Multiple Vitamins-Minerals (PRESERVISION AREDS 2+MULTI VIT PO) Take by mouth.     No current facility-administered medications for this visit.     Review of Systems  Constitutional: Negative for diaphoresis, fever, malaise/fatigue and weight loss (up 4 pounds).  HENT: Negative.  Negative for congestion, ear discharge, ear pain, nosebleeds, sinus pain and sore throat.   Eyes: Negative.        Macular degeneration.  Left eye injection on 08/06/2018.  Respiratory: Negative for cough, hemoptysis, sputum production and shortness of breath.        PMH (+) for OSAH syndrome - uses nocturnal PAP therapy.   Cardiovascular: Negative for chest pain, palpitations, orthopnea, leg swelling and PND.  Gastrointestinal: Negative for abdominal pain, blood in stool, constipation, diarrhea, melena, nausea and vomiting.  Genitourinary: Negative for dysuria, frequency, hematuria and urgency.  Musculoskeletal: Positive for back pain and joint pain (DDD; OA.). Negative for falls and myalgias.  Skin: Negative for itching and rash.  Neurological: Negative for dizziness, tremors, weakness and headaches.  Endo/Heme/Allergies: Positive for environmental allergies (seasonal). Does not bruise/bleed easily.   Psychiatric/Behavioral: Negative for depression, memory loss and suicidal ideas. The patient is not nervous/anxious and does not have insomnia.        Situational stress related to the care of her husband.  All other systems reviewed and are negative.  Performance status (ECOG): 0 - Asymptomatic  Vital Signs: BP (!) 155/74 (BP Location: Right Arm, Patient Position: Sitting)   Pulse 88   Temp 97.7 F (36.5 C) (Tympanic)   Resp 18   Ht _0  (1.702 m)   Wt 228 lb 6.3 oz (103.6 kg)   SpO2 93%   BMI 35.77 kg/m   Physical Exam  Constitutional: She is oriented to person, place, and time and well-developed, well-nourished, and in no distress.  HENT:  Head: Normocephalic and atraumatic.  Mouth/Throat: Oropharynx is clear and moist and mucous membranes are normal. She has dentures.  Short gray hair.  Eyes: Conjunctivae and EOM are normal. No scleral icterus.  Glasses.  Brown eyes.  Neurological: She is alert and oriented to person, place, and time. Gait normal.  Psychiatric: Mood, affect and judgment normal.  Nursing note and vitals reviewed.   No visits with results within 3 Day(s) from this visit.  Latest known visit with results is:  Appointment on 08/08/2018  Component Date Value Ref Range Status  . Specimen Type 08/08/2018 BLOOD   Final  . Cells Counted 08/08/2018 200   Final  . Cells Analyzed 08/08/2018 200   Final  . FISH Result 08/08/2018 Comment:   Final   NORMAL:  NO BCR OR ABL GENE REARRANGEMENT OBSERVED  . Interpretation 08/08/2018 Comment:   Final   Comment: (NOTE)             nuc ish 9q34(ASS1,ABL1)x2,22q11.2(BCRx2)[200].      The fluorescence in situ hybridization (FISH) study was normal. FISH, using unique sequence DNA probes for the ABL1 and BCR gene regions showed two ABL1 signals (red), two control ASS1 gene signals (aqua) located adjacent to the ABL1 locus at 9q34, and two BCR signals (green) at 22q11.2 in all interphase nuclei examined. There was NO  evidence of CML or ALL-associated BCR/ABL1 dual fusion signals in this analysis. .      This analysis is limited to abnormalities detectable by the specific probes included in the study. FISH results should be  interpreted within the context of a full cytogenetic analysis and pathology evaluation. .      This test was developed and its performance characteristics determined by [employer]). It has not been cleared or approved by the U.S. Food and Drug Administration. A BCR-ABL1 gene fusion in greater than 3 interphase nuclei from                           a patient with a new clinical diagnosis is considered positive. The DNA probe vendor for this study was [employer], research (physical sciences)).   . Director Review: 08/08/2018 Comment:   Final   Comment: (NOTE) Perfecto Kingdom, PHD, [place] Performed At: [facility] [address] [place], [place] [id number] Nechama Guard MD QZ:[id number] Performed At: [facility] [address] [place], [place] [id number] Rush Farmer MD GY:[id number]   . Erythropoietin 08/08/2018 2.9  2.6 - 18.5 mIU/mL Final   Comment: (NOTE) [employer]man [employer] DxI [place] obtained with different assay methods or kits cannot be used interchangeably. Results cannot be interpreted as absolute evidence of the presence or absence of malignant disease. Performed At: [facility] [place], [place] [id number] Rush Farmer MD IO:[id number]   . WBC 08/08/2018 16.7* 3.6 - 11.0 K/uL Final  . RBC 08/08/2018 5.18  3.80 - 5.20 MIL/uL Final  . Hemoglobin 08/08/2018 15.3  12.0 - 16.0 g/dL Final  . HCT 08/08/2018 45.5  35.0 - 47.0 % Final  . MCV 08/08/2018 87.9  80.0 - 100.0 fL Final  . MCH 08/08/2018 29.6  26.0 - 34.0 pg Final  . MCHC 08/08/2018 33.7  32.0 - 36.0 g/dL Final  . RDW 08/08/2018 13.8  11.5 - 14.5 % Final  . Platelets 08/08/2018 552* 150 - 440 K/uL Final   . Neutrophils Relative % 08/08/2018 82  % Final  . Neutro Abs 08/08/2018 13.8* 1.4 - 6.5 K/uL Final  . Lymphocytes Relative 08/08/2018 5  % Final  . Lymphs Abs 08/08/2018 0.8* 1.0 - 3.6 K/uL Final  . Monocytes Relative 08/08/2018 8  % Final  . Monocytes Absolute 08/08/2018 1.3* 0.2 - 0.9 K/uL Final  . Eosinophils Relative 08/08/2018 4  % Final  . Eosinophils Absolute 08/08/2018 0.6  0 - 0.7 K/uL Final  . Basophils Relative 08/08/2018 1  % Final  . Basophils Absolute 08/08/2018 0.1  0 - 0.1 K/uL Final   Performed at [facility], [address] [place], [place] [address]  . JAK2 GenotypR 08/08/2018 Comment*  Final   Comment: (NOTE) Result:  POSITIVE for the detection of the V617F mutation. Interpretation:  The assay detected the presence of a G to T nucleotide change encoding the V617F mutation within JAK2. Interpretation of this result should be made in the context of other clinical, morphologic, and cytogenetic findings.   Marland Kitchen BACKGROUND: 08/08/2018 Comment   Final   Comment: (NOTE) JAK2 is a cytoplasmic tyrosine kinase with a key role in signal transduction from multiple hematopoietic growth factor receptors. A point mutation within exon 14 of the JAK2 gene ([id number]) encoding a valine to phenylalanine substitution at position 617 of the JAK2 protein (V617F) has been identified in most patients with polycythemia vera, and in about half of those with either essential thrombocythemia or idiopathic myelofibrosis. The V617F has also been detected, although infrequently, in other myeloid disorders such as chronic myelomonocytic leukemia and chronic neutrophilic luekemia. V617F is  an acquired mutation that alters a highly conserved valine present in the negative regulatory JH2 domain of the JAK2 protein and is predicted to dysregulate kinase activity. Methodology: Total genomic DNA was extracted and subjected to TaqMan real-time PCR amplification/detection. Two  amplification products per sample were monitored by real-time PCR using primers/probes s                          pecific to JAK2 wild type (WT) and JAK2 mutant V617F. The ABI7900 Absolute Quantitation software will compare the patient specimen valuse to the standard curves and generate percent values for wild type and mutant type. In vitro studies have indicated that this assay has an analytical sensitivity of 1%. References: Baxter EJ, Scott Phineas Real, et al. Acquired mutation of the tyrosine kinase JAK2 in human myeloproliferative disorders. Lancet. 2005 Mar 19-25; 365(9464):1054-1061. Alfonso Ramus Couedic JP. A unique clonal JAK2 mutation leading to constitutive signaling causes polycythaemia vera. Nature. 2005 Apr 28; 434(7037):1144-1148. Kralovics R, Passamonti F, Buser AS, et al. A gain-of-function mutation of JAK2 in myeloproliferative disorders. N Engl J Med. 2005 Apr 28; 352(17):1779-1790.   . Director Review, JAK2 08/08/2018 Comment   Final   Comment: (NOTE) Katina Degree, MD, PhD Director, [facility] and [place], [place] [address] [phone] This test was developed and its performance characteristics determined by [facility]. It has not been cleared or approved by the Food and Drug Administration.   Marland Kitchen REFLEX: 08/08/2018 Comment   Final   Comment: (NOTE) Reflex to CALR Mutation Analysis, JAK2 Exon 12-15 Mutation Analysis, and MPL Mutation Analysis is not indicated.   Marland Kitchen Extraction 08/08/2018 Completed   Corrected   Comment: (NOTE) Performed At: [facility] [address] [place], [place] [id number] Nechama Guard MD QQ:[id number] Performed At: [facility] [address] [place], [place] [id number] Nechama Guard MD IP:[id number]   . Carbon Monoxide, Blood 08/08/2018 3.0  0.0 - 3.6 % Final   Comment: (NOTE)                            Environmental Exposure:                              Nonsmokers           <3.7                             Smokers              <9.9                            Occupational Exposure:                             BEI                   3.5                                Detection Limit =  0.2 Performed At: [facility] [place], [place] [id number] Rush Farmer MD WI:[id number]  Assessment:  SYRIA KESTNER is a 77 y.o. female with polycythemia.  She has had sleep apnea x 7 years and uses CPAP.  She has a 40 pack year smoking history (stopped 13 years ago).  She denies and cardiac history.  CBC on 07/26/2018 revealed a hematocrit of 49.3, hemoglobin 16.5, MCV 88.2, platelets 520,000, WBC 15,500 with an ANC of 12,958.    Work up on 08/08/2018 revealed a WBC of 16,700 ([place] 13,800). Hemoglobin 15.3, hematocrit 45.5, MCV 87.9, and platelets 552,000. Erythropoietin level was normal at 2.9 mIU/mL. BCR/ABL demonstrated no BCR or ABL gene rearrangements. Carbon monoxide level was normal at 3.0% (0-3.6 %).  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   Symptomatically, patient is doing well overall.  She denies any acute physical concerns.  Exam is grossly unremarkable.  Plan: 1. Polycythemia rubra vera (PV)  Review labs from 08/08/2018.    JAK2 (+) for V617F mutation.   Platelets 552,000 on 08/08/2018. Goal is < 400,000  Hematocrit 45.5 on 08/08/2018.  Goal is <= 42.  WBC 16,700 (ANC 13,800)  Discuss BCR/ABL (-), JAK2 (+) MPN - consistent with PV diagnosis.   Provided with written information on PV diagnosis for review at home. She was encouraged to review and formulate a list of questions.   Discuss need for bone marrow biopsy and aspiration.   Discuss therapeutic phlebotomy program to maintain hematocrit of </= 42.  Discuss initiation of hydroxyurea 500 mg daily.   Potential side effects reviewed.  Written information provided on hydroxyurea therapy.  2. RTC weekly for labs (CBC ) +/-  phlebotomy. 3. RTC 10-14 days AFTER bone marrow for MD assessment and labs (CBC with diff, CMP).   Honor Loh, NP  08/22/2018, 3:20 PM   I saw and evaluated the patient, participating in the key portions of the service and reviewing pertinent diagnostic studies and records.  I reviewed the nurse practitioner's note and agree with the findings and the plan.  The assessment and plan were discussed with the patient.  Numerous questions were asked by the patient and answered.   Nolon Stalls, MD 08/22/2018,3:20 PM

## 2018-08-23 DIAGNOSIS — R569 Unspecified convulsions: Secondary | ICD-10-CM | POA: Diagnosis not present

## 2018-08-23 DIAGNOSIS — I10 Essential (primary) hypertension: Secondary | ICD-10-CM | POA: Diagnosis not present

## 2018-08-23 DIAGNOSIS — G479 Sleep disorder, unspecified: Secondary | ICD-10-CM | POA: Diagnosis not present

## 2018-08-24 ENCOUNTER — Encounter: Payer: Self-pay | Admitting: Internal Medicine

## 2018-08-24 ENCOUNTER — Telehealth: Payer: Self-pay

## 2018-08-24 NOTE — Telephone Encounter (Signed)
spoke with the patient to inform her of a bone Marrow appointment schedule for 09/03/18 @ 7:30 for a 8:30.  NPO after midnight.   May take meds with sips of water. If she have any question please feel free to call [phone]. The patient was agreeable and understanding to go to her appointment.

## 2018-08-24 NOTE — Telephone Encounter (Signed)
-----   Message from Karen Kitchens, NP sent at 08/23/2018  6:09 PM EDT ----- Regarding: BMBx Bone marrow scheduled for 09/03/2018.   1. Needs to arrive at 0730 for an 0830 procedure.  2. NPO after midnight.  3. May take meds with sips of water.   Call us with questions.   Thanks,  Gaspar Bidding

## 2018-08-24 NOTE — Progress Notes (Signed)
SCANNED IN OVERNIGHT OX ORDERED ON 08/16/18

## 2018-08-27 ENCOUNTER — Telehealth: Payer: Self-pay | Admitting: Internal Medicine

## 2018-08-27 NOTE — Telephone Encounter (Signed)
We did receive patient results from overnight oximetry test and she does qualify for nightly o2 , will start order process for o2 bleed through cpap machine with [facility]. Angelica Juarez

## 2018-08-29 ENCOUNTER — Inpatient Hospital Stay: Payer: Medicare Other

## 2018-08-29 ENCOUNTER — Ambulatory Visit (INDEPENDENT_AMBULATORY_CARE_PROVIDER_SITE_OTHER): Payer: Medicare Other | Admitting: Internal Medicine

## 2018-08-29 DIAGNOSIS — R0602 Shortness of breath: Secondary | ICD-10-CM | POA: Diagnosis not present

## 2018-08-29 DIAGNOSIS — D45 Polycythemia vera: Secondary | ICD-10-CM | POA: Diagnosis not present

## 2018-08-29 DIAGNOSIS — M129 Arthropathy, unspecified: Secondary | ICD-10-CM | POA: Diagnosis not present

## 2018-08-29 DIAGNOSIS — E119 Type 2 diabetes mellitus without complications: Secondary | ICD-10-CM | POA: Diagnosis not present

## 2018-08-29 DIAGNOSIS — N183 Chronic kidney disease, stage 3 (moderate): Secondary | ICD-10-CM | POA: Diagnosis not present

## 2018-08-29 DIAGNOSIS — J449 Chronic obstructive pulmonary disease, unspecified: Secondary | ICD-10-CM | POA: Diagnosis not present

## 2018-08-29 DIAGNOSIS — I129 Hypertensive chronic kidney disease with stage 1 through stage 4 chronic kidney disease, or unspecified chronic kidney disease: Secondary | ICD-10-CM | POA: Diagnosis not present

## 2018-08-29 LAB — CBC
HEMATOCRIT: 42.4 % (ref 36.0–46.0)
Hemoglobin: 14.3 g/dL (ref 12.0–15.0)
MCH: 30.2 pg (ref 26.0–34.0)
MCHC: 33.7 g/dL (ref 30.0–36.0)
MCV: 89.5 fL (ref 80.0–100.0)
NRBC: 0 % (ref 0.0–0.2)
Platelets: 464 10*3/uL — ABNORMAL HIGH (ref 150–400)
RBC: 4.74 MIL/uL (ref 3.87–5.11)
RDW: 13.9 % (ref 11.5–15.5)
WBC: 14.7 10*3/uL — AB (ref 4.0–10.5)

## 2018-08-29 LAB — PULMONARY FUNCTION TEST

## 2018-08-30 NOTE — Procedures (Signed)
[place], [address]  DATE OF SERVICE: August 29, 2018  Complete Pulmonary Function Testing Interpretation:  FINDINGS:  The forced vital capacity is moderately decreased.  The FEV1 is 1.29 L which is 53% of predicted and is moderately decreased.  Postbronchodilator there is no significant change in the FEV1 however clinical improvement may occur in the absence of spirometric improvement.  The FEV1 FVC ratio is moderately decreased.  The total lung capacity is mildly decreased.  Residual volume is decreased residual volume total lung capacity ratio is increased the FRC is within normal limits.  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  There is no significant change postbronchodilator clinical correlation is recommended.  Causes of reduced DLCO include interstitial lung disease emphysema ongoing tobacco use and pulmonary circulatory disease clinical correlation is strongly recommended.  Allyne Gee, MD [facility] Pulmonary Critical Care Medicine Sleep Medicine

## 2018-08-31 ENCOUNTER — Other Ambulatory Visit: Payer: Self-pay | Admitting: Student

## 2018-09-02 ENCOUNTER — Other Ambulatory Visit: Payer: Self-pay | Admitting: Radiology

## 2018-09-03 ENCOUNTER — Ambulatory Visit: Payer: Self-pay

## 2018-09-03 ENCOUNTER — Other Ambulatory Visit (HOSPITAL_COMMUNITY)
Admission: RE | Admit: 2018-09-03 | Disposition: A | Discharge status: Home / Self Care | Payer: Medicare Other | Source: Ambulatory Visit | Attending: Hematology and Oncology | Admitting: Hematology and Oncology

## 2018-09-03 ENCOUNTER — Ambulatory Visit
Admission: RE | Admit: 2018-09-03 | Discharge: 2018-09-03 | Disposition: A | Discharge status: Home / Self Care | Payer: Medicare Other | Source: Ambulatory Visit | Attending: Urgent Care | Admitting: Urgent Care

## 2018-09-03 DIAGNOSIS — I129 Hypertensive chronic kidney disease with stage 1 through stage 4 chronic kidney disease, or unspecified chronic kidney disease: Secondary | ICD-10-CM | POA: Diagnosis not present

## 2018-09-03 DIAGNOSIS — J449 Chronic obstructive pulmonary disease, unspecified: Secondary | ICD-10-CM | POA: Diagnosis not present

## 2018-09-03 DIAGNOSIS — I10 Essential (primary) hypertension: Secondary | ICD-10-CM | POA: Insufficient documentation

## 2018-09-03 DIAGNOSIS — Z7982 Long term (current) use of aspirin: Secondary | ICD-10-CM | POA: Diagnosis not present

## 2018-09-03 DIAGNOSIS — Z7984 Long term (current) use of oral hypoglycemic drugs: Secondary | ICD-10-CM | POA: Insufficient documentation

## 2018-09-03 DIAGNOSIS — Z87891 Personal history of nicotine dependence: Secondary | ICD-10-CM | POA: Diagnosis not present

## 2018-09-03 DIAGNOSIS — E119 Type 2 diabetes mellitus without complications: Secondary | ICD-10-CM | POA: Diagnosis not present

## 2018-09-03 DIAGNOSIS — Z87442 Personal history of urinary calculi: Secondary | ICD-10-CM | POA: Insufficient documentation

## 2018-09-03 DIAGNOSIS — D45 Polycythemia vera: Secondary | ICD-10-CM | POA: Insufficient documentation

## 2018-09-03 DIAGNOSIS — G4730 Sleep apnea, unspecified: Secondary | ICD-10-CM | POA: Diagnosis not present

## 2018-09-03 DIAGNOSIS — Z79899 Other long term (current) drug therapy: Secondary | ICD-10-CM | POA: Insufficient documentation

## 2018-09-03 DIAGNOSIS — D471 Chronic myeloproliferative disease: Secondary | ICD-10-CM | POA: Diagnosis not present

## 2018-09-03 DIAGNOSIS — N189 Chronic kidney disease, unspecified: Secondary | ICD-10-CM | POA: Diagnosis not present

## 2018-09-03 DIAGNOSIS — D72829 Elevated white blood cell count, unspecified: Secondary | ICD-10-CM | POA: Diagnosis not present

## 2018-09-03 DIAGNOSIS — R569 Unspecified convulsions: Secondary | ICD-10-CM | POA: Diagnosis not present

## 2018-09-03 DIAGNOSIS — E785 Hyperlipidemia, unspecified: Secondary | ICD-10-CM | POA: Insufficient documentation

## 2018-09-03 DIAGNOSIS — Z8544 Personal history of malignant neoplasm of other female genital organs: Secondary | ICD-10-CM | POA: Insufficient documentation

## 2018-09-03 DIAGNOSIS — Z7951 Long term (current) use of inhaled steroids: Secondary | ICD-10-CM | POA: Diagnosis not present

## 2018-09-03 DIAGNOSIS — E1122 Type 2 diabetes mellitus with diabetic chronic kidney disease: Secondary | ICD-10-CM | POA: Diagnosis not present

## 2018-09-03 DIAGNOSIS — Z1589 Genetic susceptibility to other disease: Secondary | ICD-10-CM

## 2018-09-03 HISTORY — DX: Dyspnea, unspecified: R06.00

## 2018-09-03 HISTORY — DX: Personal history of urinary calculi: Z87.442

## 2018-09-03 LAB — CBC WITH DIFFERENTIAL/PLATELET
Abs Immature Granulocytes: 0.17 10*3/uL — ABNORMAL HIGH (ref 0.00–0.07)
BASOS PCT: 1 %
Basophils Absolute: 0.2 10*3/uL — ABNORMAL HIGH (ref 0.0–0.1)
Eosinophils Absolute: 0.4 10*3/uL (ref 0.0–0.5)
Eosinophils Relative: 3 %
HEMATOCRIT: 42 % (ref 36.0–46.0)
Hemoglobin: 14.1 g/dL (ref 12.0–15.0)
IMMATURE GRANULOCYTES: 1 %
LYMPHS ABS: 0.6 10*3/uL — AB (ref 0.7–4.0)
LYMPHS PCT: 4 %
MCH: 29.8 pg (ref 26.0–34.0)
MCHC: 33.6 g/dL (ref 30.0–36.0)
MCV: 88.8 fL (ref 80.0–100.0)
MONO ABS: 0.9 10*3/uL (ref 0.1–1.0)
MONOS PCT: 6 %
NEUTROS ABS: 11.7 10*3/uL — AB (ref 1.7–7.7)
NEUTROS PCT: 85 %
PLATELETS: 472 10*3/uL — AB (ref 150–400)
RBC: 4.73 MIL/uL (ref 3.87–5.11)
RDW: 13.7 % (ref 11.5–15.5)
WBC: 13.9 10*3/uL — ABNORMAL HIGH (ref 4.0–10.5)
nRBC: 0 % (ref 0.0–0.2)

## 2018-09-03 LAB — PROTIME-INR
INR: 1.12
Prothrombin Time: 14.3 seconds (ref 11.4–15.2)

## 2018-09-03 LAB — GLUCOSE, CAPILLARY: GLUCOSE-CAPILLARY: 117 mg/dL — AB (ref 70–99)

## 2018-09-03 LAB — APTT: aPTT: 33 seconds (ref 24–36)

## 2018-09-03 MED ORDER — MIDAZOLAM HCL 5 MG/5ML IJ SOLN
INTRAMUSCULAR | Status: AC
  Filled 2018-09-03: qty 5

## 2018-09-03 MED ORDER — MIDAZOLAM HCL 5 MG/5ML IJ SOLN
INTRAMUSCULAR | Status: AC | PRN
  Administered 2018-09-03 (×2): 1 mg via INTRAVENOUS

## 2018-09-03 MED ORDER — SODIUM CHLORIDE 0.9 % IV SOLN
INTRAVENOUS | Status: DC
  Administered 2018-09-03: 08:00:00 via INTRAVENOUS

## 2018-09-03 MED ORDER — FENTANYL CITRATE (PF) 100 MCG/2ML IJ SOLN
INTRAMUSCULAR | Status: AC | PRN
  Administered 2018-09-03: 50 ug via INTRAVENOUS

## 2018-09-03 MED ORDER — FENTANYL CITRATE (PF) 100 MCG/2ML IJ SOLN
INTRAMUSCULAR | Status: AC
  Filled 2018-09-03: qty 4

## 2018-09-03 MED ORDER — HEPARIN SOD (PORK) LOCK FLUSH 100 UNIT/ML IV SOLN
INTRAVENOUS | Status: AC
  Filled 2018-09-03: qty 5

## 2018-09-03 NOTE — Progress Notes (Signed)
Dr, watts came by to speak with pt. & her signif. Other, Elpidio Eric, re: procedure. Both verbalize understanding.

## 2018-09-03 NOTE — Procedures (Signed)
Pre-procedure Diagnosis: Polycythemia Vera Post-procedure Diagnosis: Same  Technically successful CT guided bone marrow aspiration and biopsy of left iliac crest.   Complications: None Immediate  EBL: None  SignedSandi Mariscal Pager: [phone] 09/03/2018, 9:26 AM

## 2018-09-03 NOTE — Consult Note (Signed)
Chief Complaint: Polycythemia vera  Referring Physician(s): Honor Loh  Patient Status: [facility] - Out-pt  History of Present Illness: Angelica Juarez is a 77 y.o. female with past medical history significant for seizures, chronic renal insufficiency, diabetes, hypertension, hyperlipidemia, COPD and sleep apnea on BiPAP with recent diagnosis of polycythemia vera who presents today for CT-guided bone marrow biopsy and aspiration.  The patient is accompanied by her husband though serves as her own historian.  Patient admits to slight worsening of her baseline shortness of breath during the past month.  She is otherwise without complaint.  Specifically, no chest pain, fever or chills.    Past Medical History:  Diagnosis Date  . Allergy    Seasonal  . Arthritis   . 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 Chronic kidney disease    chronic renal insufficiency  . Colon polyps 05/21/14  . COPD (chronic obstructive pulmonary disease) ([place])   . Diabetes mellitus without complication ([place])   . Dyspnea   . Fracture closed, humerus, shaft    right   . History of kidney stones    40 years ago  . Hyperlipidemia 04/30/14  . Hypertension   . Impingement syndrome of right shoulder 12/29/15  . Personal history of radiation therapy   . Pneumonia    hx  . Rotator cuff tear 04/21/16   right  . Seizures ([place])   . Sleep apnea     Past Surgical History:  Procedure Laterality Date  . ABDOMINAL HYSTERECTOMY    . 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 EYE SURGERY     bilateral cataracts  . JOINT REPLACEMENT     bilateral knee replacement  . NASAL SEPTUM SURGERY    . REPLACEMENT TOTAL KNEE Bilateral 2004  . SHOULDER ARTHROSCOPY WITH BICEPSTENOTOMY Right 05/25/2016   Procedure: SHOULDER ARTHROSCOPY WITH BICEPSTENOTOMY;  Surgeon: Leanor Kail, MD;  Location: [facility] ORS;  Service: Orthopedics;  Laterality: Right;  . SHOULDER ARTHROSCOPY WITH DISTAL CLAVICLE RESECTION Right  05/25/2016   Procedure: SHOULDER ARTHROSCOPY WITH DISTAL CLAVICLE RESECTION;  Surgeon: Leanor Kail, MD;  Location: [facility] ORS;  Service: Orthopedics;  Laterality: Right;  . SHOULDER ARTHROSCOPY WITH OPEN ROTATOR CUFF REPAIR Right 05/25/2016   Procedure: SHOULDER ARTHROSCOPY WITH OPEN ROTATOR CUFF REPAIR;  Surgeon: Leanor Kail, MD;  Location: [facility] ORS;  Service: Orthopedics;  Laterality: Right;  . SUBACROMIAL DECOMPRESSION Right 05/25/2016   Procedure: SUBACROMIAL DECOMPRESSION;  Surgeon: Leanor Kail, MD;  Location: [facility] ORS;  Service: Orthopedics;  Laterality: Right;    Allergies: Iodinated diagnostic agents; Latex; Phenobarbital; and Tape  Medications: Prior to Admission medications   Medication Sig Start Date End Date Taking? Authorizing Provider  albuterol (PROVENTIL HFA;VENTOLIN HFA) 108 (90 Base) MCG/ACT inhaler Inhale into the lungs as needed. 08/15/16  Yes [provider]  aspirin 81 MG tablet Take 81 mg by mouth daily.   Yes [provider]  atorvastatin (LIPITOR) 40 MG tablet Take 40 mg by mouth daily. 04/04/16  Yes [provider]  B Complex Vitamins (VITAMIN B COMPLEX PO) Take 1 tablet by mouth daily.   Yes [provider]  Calcium Carbonate-Vitamin D 600-200 MG-UNIT CAPS Take 1 capsule by mouth daily.   Yes [provider]  cloNIDine (CATAPRES) 0.2 MG tablet Take 0.2 mg by mouth 2 (two) times daily.   Yes [provider]  diphenhydrAMINE (BENADRYL) 25 mg capsule Take 25 mg by mouth daily.   Yes [provider]  fluticasone (FLONASE) 50 MCG/ACT nasal spray Place 2 sprays into both nostrils daily. 06/13/18  Yes Allyne Gee, MD  Fluticasone-Umeclidin-Vilant (TRELEGY ELLIPTA) 100-62.5-25 MCG/INH AEPB Inhale 1 puff into the lungs daily. 08/16/18  Yes Allyne Gee, MD  furosemide (LASIX) 20 MG tablet Take 1 tablet (20 mg total) by mouth 2 (two) times daily. 08/08/16  Yes Demetrios Loll, MD  hydrALAZINE (APRESOLINE) 100 MG  tablet Take 50 mg by mouth 3 (three) times daily.  02/04/16  Yes [provider]  levETIRAcetam (KEPPRA) 750 MG tablet Take 750 mg by mouth 2 (two) times daily. 03/14/16  Yes [provider]  metFORMIN (GLUCOPHAGE) 500 MG tablet Take 500 mg by mouth 2 (two) times daily with a meal. 12/25/15  Yes [provider]  metoprolol succinate (TOPROL-XL) 100 MG 24 hr tablet Take 100 mg by mouth daily. 06/25/18  Yes [provider]  montelukast (SINGULAIR) 10 MG tablet TAKE 1 TABLET BY MOUTH EVERY DAY 07/02/15  Yes Kathrine Haddock, NP  Multiple Vitamin (MULTI-VITAMINS) TABS Take 1 tablet by mouth daily.   Yes [provider]  NIFEdipine (NIFEDICAL XL) 30 MG 24 hr tablet Take 30 mg by mouth daily. 09/30/15  Yes [provider]  sitaGLIPtin (JANUVIA) 25 MG tablet Take 1 tablet by mouth daily. 01/01/18  Yes [provider]  traZODone (DESYREL) 50 MG tablet Take 50-100 mg by mouth at bedtime as needed. Sleep 04/27/16  Yes [provider]  valsartan-hydrochlorothiazide (DIOVAN-HCT) 320-25 MG tablet Take 1 tablet by mouth daily. 04/14/16  Yes [provider]  vitamin C (ASCORBIC ACID) 500 MG tablet Take 500 mg by mouth 2 (two) times daily.   Yes [provider]  albuterol (ACCUNEB) 1.25 MG/3ML nebulizer solution Take 1 ampule by nebulization every 6 (six) hours as needed. wheezing 04/27/16   [provider]  albuterol (ACCUNEB) 1.25 MG/3ML nebulizer solution USE 1 VIAL VIA NEBULIZER EVERY 6 HOURS AS NEEDED FOR WHEEZING 07/11/18   [provider]  EPINEPHrine (EPIPEN 2-PAK) 0.3 mg/0.3 mL IJ SOAJ injection 0.3 mg once. Reported on 05/25/2016 03/27/15   [provider]  glucose blood (ONE TOUCH ULTRA TEST) test strip USE ONE STRIP TO CHECK GLUCOSE ONCE DAILY 02/19/18   [provider]  Multiple Vitamins-Minerals (MULTIVITAMIN WITH MINERALS) tablet Take 1 tablet by mouth daily.    [provider]    Multiple Vitamins-Minerals (PRESERVISION AREDS 2+MULTI VIT PO) Take by mouth.    [provider]  OVER THE COUNTER MEDICATION Place 1 drop into both eyes daily. Allergy eye drops    [provider]  umeclidinium-vilanterol (ANORO ELLIPTA) 62.5-25 MCG/INH AEPB Inhale 1 puff into the lungs daily.    [provider]     Family History  Problem Relation Age of Onset  . Breast cancer Paternal Aunt 46  . Diabetes Father   . Heart disease Father     Social History   Socioeconomic History  . Marital status: Single    Spouse name: Not on file  . Number of children: Not on file  . Years of education: Not on file  . Highest education level: Not on file  Occupational History  . Not on file  Social Needs  . Financial resource strain: Not on file  . Food insecurity:    Worry: Not on file    Inability: Not on file  . Transportation needs:    Medical: Not on file    Non-medical: Not on file  Tobacco Use  . Smoking status: Former Smoker    Packs/day: 1.00    Years: 40.00    Pack years: 40.00    Last attempt to quit: 05/12/2005    Years since quitting: 13.3  . Smokeless tobacco: Never Used  Substance and Sexual Activity  . Alcohol use: No  . Drug use: No  . Sexual activity: Not on file  Lifestyle  . Physical activity:    Days per week: Not on file    Minutes per session: Not on file  . Stress: Not on file  Relationships  . Social connections:    Talks on phone: Not on file    Gets together: Not on file    Attends religious service: Not on file    Active member of club or organization: Not on file    Attends meetings of clubs or organizations: Not on file    Relationship status: Not on file  Other Topics Concern  . Not on file  Social History Narrative  . Not on file    ECOG Status: 1 - Symptomatic but completely ambulatory  Review of Systems: A 12 point ROS discussed and pertinent positives are indicated in the HPI above.  All other systems  are negative.  Review of Systems  Constitutional: Negative for activity change, chills and fever.  Respiratory: Positive for shortness of breath.   Cardiovascular: Negative for chest pain.    Vital Signs: BP (!) 120/47   Pulse 85   Temp (!) 97.5 F (36.4 C) (Oral)   Resp 18   Wt 102.1 kg   SpO2 91%   BMI 35.24 kg/m   Physical Exam  Constitutional: She appears well-nourished.  HENT:  Head: Normocephalic and atraumatic.  Cardiovascular: Normal rate.  Pulmonary/Chest:  Bilateral expiratory wheezes.  Psychiatric: She has a normal mood and affect. Her behavior is normal.  Nursing note and vitals reviewed.   Imaging: No results found.  Labs:  CBC: Recent Labs    08/08/18 1221 08/29/18 1452 09/03/18 0751  WBC 16.7* 14.7* 13.9*  HGB 15.3 14.3 14.1  HCT 45.5 42.4 42.0  PLT 552* 464* 472*    COAGS: Recent Labs    09/03/18 0751  INR 1.12  APTT 33    BMP: No results for input(s): NA, K, CL, CO2, GLUCOSE, BUN, CALCIUM, CREATININE, GFRNONAA, GFRAA in the last 8760 hours.  Invalid input(s): CMP  LIVER FUNCTION TESTS: No results for input(s): BILITOT, AST, ALT, ALKPHOS, PROT, ALBUMIN in the last 8760 hours.  TUMOR MARKERS: No results for input(s): AFPTM, CEA, CA199, CHROMGRNA in the last 8760 hours.  Assessment and Plan:  ASHELYNN MARKS is a 77 y.o. female with past medical history significant for seizures, chronic renal insufficiency, diabetes, hypertension, hyperlipidemia, COPD and sleep apnea on BiPAP with recent diagnosis of polycythemia vera who presents today for CT-guided bone marrow biopsy and aspiration.  The patient is accompanied by her husband though serves as her own historian.  Patient admits to slight worsening of her baseline shortness of breath during the past month.  She is otherwise without complaint.  Specifically, no chest pain, fever or chills.  Risks and benefits of CT-guided bone marrow biopsy and aspiration discussed with the patient  including, but not limited to bleeding, infection, damage to adjacent structures or low yield requiring additional tests.  All of the patient's questions were answered, patient is agreeable to proceed.  Consent signed and in chart.  Thank you for this interesting consult.  I greatly  enjoyed meeting Angelica Juarez and look forward to participating in their care.  A copy of this report was sent to the requesting provider on this date.  Electronically Signed: Sandi Mariscal, MD 09/03/2018, 8:28 AM   I spent a total of 15 Minutes in face to face in clinical consultation, greater than 50% of which was counseling/coordinating care for CT guided BM Bx.

## 2018-09-05 ENCOUNTER — Inpatient Hospital Stay: Payer: Medicare Other

## 2018-09-05 VITALS — BP 163/74 | HR 79 | Resp 18

## 2018-09-05 DIAGNOSIS — D45 Polycythemia vera: Secondary | ICD-10-CM

## 2018-09-05 DIAGNOSIS — E119 Type 2 diabetes mellitus without complications: Secondary | ICD-10-CM | POA: Diagnosis not present

## 2018-09-05 DIAGNOSIS — D751 Secondary polycythemia: Secondary | ICD-10-CM

## 2018-09-05 DIAGNOSIS — N183 Chronic kidney disease, stage 3 (moderate): Secondary | ICD-10-CM | POA: Diagnosis not present

## 2018-09-05 DIAGNOSIS — I129 Hypertensive chronic kidney disease with stage 1 through stage 4 chronic kidney disease, or unspecified chronic kidney disease: Secondary | ICD-10-CM | POA: Diagnosis not present

## 2018-09-05 DIAGNOSIS — M129 Arthropathy, unspecified: Secondary | ICD-10-CM | POA: Diagnosis not present

## 2018-09-05 DIAGNOSIS — J449 Chronic obstructive pulmonary disease, unspecified: Secondary | ICD-10-CM | POA: Diagnosis not present

## 2018-09-05 LAB — CBC
HEMATOCRIT: 44.6 % (ref 36.0–46.0)
Hemoglobin: 15 g/dL (ref 12.0–15.0)
MCH: 29.8 pg (ref 26.0–34.0)
MCHC: 33.6 g/dL (ref 30.0–36.0)
MCV: 88.7 fL (ref 80.0–100.0)
NRBC: 0 % (ref 0.0–0.2)
Platelets: 518 10*3/uL — ABNORMAL HIGH (ref 150–400)
RBC: 5.03 MIL/uL (ref 3.87–5.11)
RDW: 14 % (ref 11.5–15.5)
WBC: 14.5 10*3/uL — AB (ref 4.0–10.5)

## 2018-09-05 NOTE — Progress Notes (Signed)
Six attempts by three different nurses were unsuccessful in getting IV access for phlebotomy.  Angelica Loh NP aware and will schedule patient for phlebotomy clinic.  Change in plans explained to patient.  Patient was also made aware that she would be called for appointment, to discuss bone marrow results, in the near future.  Patient verbalized understanding.

## 2018-09-06 ENCOUNTER — Ambulatory Visit: Payer: Self-pay | Admitting: Internal Medicine

## 2018-09-07 ENCOUNTER — Encounter: Payer: Self-pay | Admitting: Urgent Care

## 2018-09-07 ENCOUNTER — Inpatient Hospital Stay: Payer: Medicare Other

## 2018-09-07 DIAGNOSIS — D751 Secondary polycythemia: Secondary | ICD-10-CM

## 2018-09-07 DIAGNOSIS — M129 Arthropathy, unspecified: Secondary | ICD-10-CM | POA: Diagnosis not present

## 2018-09-07 DIAGNOSIS — E119 Type 2 diabetes mellitus without complications: Secondary | ICD-10-CM | POA: Diagnosis not present

## 2018-09-07 DIAGNOSIS — J449 Chronic obstructive pulmonary disease, unspecified: Secondary | ICD-10-CM | POA: Diagnosis not present

## 2018-09-07 DIAGNOSIS — D45 Polycythemia vera: Secondary | ICD-10-CM | POA: Diagnosis not present

## 2018-09-07 DIAGNOSIS — I129 Hypertensive chronic kidney disease with stage 1 through stage 4 chronic kidney disease, or unspecified chronic kidney disease: Secondary | ICD-10-CM | POA: Diagnosis not present

## 2018-09-07 DIAGNOSIS — N183 Chronic kidney disease, stage 3 (moderate): Secondary | ICD-10-CM | POA: Diagnosis not present

## 2018-09-07 NOTE — Telephone Encounter (Signed)
This encounter was created in error - please disregard.

## 2018-09-11 ENCOUNTER — Encounter: Payer: Self-pay | Admitting: Internal Medicine

## 2018-09-11 ENCOUNTER — Ambulatory Visit (INDEPENDENT_AMBULATORY_CARE_PROVIDER_SITE_OTHER): Payer: Medicare Other | Admitting: Internal Medicine

## 2018-09-11 ENCOUNTER — Other Ambulatory Visit: Payer: Self-pay | Admitting: *Deleted

## 2018-09-11 VITALS — BP 120/70 | HR 79 | Resp 16 | Ht 67.0 in | Wt 233.0 lb

## 2018-09-11 DIAGNOSIS — D751 Secondary polycythemia: Secondary | ICD-10-CM | POA: Diagnosis not present

## 2018-09-11 DIAGNOSIS — G4734 Idiopathic sleep related nonobstructive alveolar hypoventilation: Secondary | ICD-10-CM

## 2018-09-11 DIAGNOSIS — G4733 Obstructive sleep apnea (adult) (pediatric): Secondary | ICD-10-CM

## 2018-09-11 DIAGNOSIS — Z9989 Dependence on other enabling machines and devices: Secondary | ICD-10-CM

## 2018-09-11 DIAGNOSIS — J449 Chronic obstructive pulmonary disease, unspecified: Secondary | ICD-10-CM | POA: Diagnosis not present

## 2018-09-11 DIAGNOSIS — J309 Allergic rhinitis, unspecified: Secondary | ICD-10-CM | POA: Diagnosis not present

## 2018-09-11 MED ORDER — ALBUTEROL SULFATE HFA 108 (90 BASE) MCG/ACT IN AERS: 2.0000 | INHALATION_SPRAY | Freq: Four times a day (QID) | RESPIRATORY_TRACT | 2 refills | Status: DC | PRN

## 2018-09-11 NOTE — Patient Instructions (Signed)

## 2018-09-11 NOTE — Progress Notes (Signed)
[facility] [place], [place] [address]  Pulmonary Sleep Medicine   Office Visit Note  Patient Name: Angelica Juarez DOB: [birth year] MRN [id number]  Date of Service: 09/11/2018  Complaints/HPI: Patient is here for follow-up results of overnight oximetry.  Her overnight oximetry shows 459 minutes with her oxygen saturation less than 88% her lowest SPO2 rating was 79% and she spent 93 consecutive minutes below 88%.  Based these results she will require oxygen at night through her CPAP.  She also complains today of increased shortness of breath at times throughout the day when she is walking or while doing other activities.  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  She may would benefit from daytime oxygen as well however she feels very bad today and is unable to do a 6-minute walk at this time to qualify her.  Once she gets some of her treatments behind her she reports she will call the office to make an appointment to have the 6-minute walk performed.  ROS  General: (-) fever, (-) chills, (-) night sweats, (-) weakness Skin: (-) rashes, (-) itching,. Eyes: (-) visual changes, (-) redness, (-) itching. Nose and Sinuses: (-) nasal stuffiness or itchiness, (-) postnasal drip, (-) nosebleeds, (-) sinus trouble. Mouth and Throat: (-) sore throat, (-) hoarseness. Neck: (-) swollen glands, (-) enlarged thyroid, (-) neck pain. Respiratory: - cough, (-) bloody sputum, + shortness of breath, - wheezing. Cardiovascular: - ankle swelling, (-) chest pain. Lymphatic: (-) lymph node enlargement. Neurologic: (-) numbness, (-) tingling. Psychiatric: (-) anxiety, (-) depression   Current Medication: Outpatient Encounter Medications as of 09/11/2018  Medication Sig  . albuterol (ACCUNEB) 1.25 MG/3ML nebulizer solution Take 1 ampule by nebulization every 6 (six) hours as needed. wheezing  .  albuterol (ACCUNEB) 1.25 MG/3ML nebulizer solution USE 1 VIAL VIA NEBULIZER EVERY 6 HOURS AS NEEDED FOR WHEEZING  . albuterol (PROVENTIL HFA;VENTOLIN HFA) 108 (90 Base) MCG/ACT inhaler Inhale into the lungs as needed.  Marland Kitchen aspirin 81 MG tablet Take 81 mg by mouth daily.  Marland Kitchen atorvastatin (LIPITOR) 40 MG tablet Take 40 mg by mouth daily.  . B Complex Vitamins (VITAMIN B COMPLEX PO) Take 1 tablet by mouth daily.  . Calcium Carbonate-Vitamin D 600-200 MG-UNIT CAPS Take 1 capsule by mouth daily.  . cloNIDine (CATAPRES) 0.2 MG tablet Take 0.2 mg by mouth 2 (two) times daily.  . diphenhydrAMINE (BENADRYL) 25 mg capsule Take 25 mg by mouth daily.  Marland Kitchen EPINEPHrine (EPIPEN 2-PAK) 0.3 mg/0.3 mL IJ SOAJ injection 0.3 mg once. Reported on 05/25/2016  . fluticasone (FLONASE) 50 MCG/ACT nasal spray Place 2 sprays into both nostrils daily.  . Fluticasone-Umeclidin-Vilant (TRELEGY ELLIPTA) 100-62.5-25 MCG/INH AEPB Inhale 1 puff into the lungs daily.  . furosemide (LASIX) 20 MG tablet Take 1 tablet (20 mg total) by mouth 2 (two) times daily.  Marland Kitchen glucose blood (ONE TOUCH ULTRA TEST) test strip USE ONE STRIP TO CHECK GLUCOSE ONCE DAILY  . hydrALAZINE (APRESOLINE) 100 MG tablet Take 50 mg by mouth 3 (three) times daily.   Marland Kitchen levETIRAcetam (KEPPRA) 750 MG tablet Take 750 mg by mouth 2 (two) times daily.  . metFORMIN (GLUCOPHAGE) 500 MG tablet Take 500 mg by mouth 2 (two) times daily with a meal.  . metoprolol succinate (TOPROL-XL) 100 MG 24 hr tablet Take 100 mg by mouth daily.  . montelukast (SINGULAIR) 10 MG tablet TAKE 1 TABLET BY MOUTH EVERY DAY  .  Multiple Vitamin (MULTI-VITAMINS) TABS Take 1 tablet by mouth daily.  . Multiple Vitamins-Minerals (MULTIVITAMIN WITH MINERALS) tablet Take 1 tablet by mouth daily.  . Multiple Vitamins-Minerals (PRESERVISION AREDS 2+MULTI VIT PO) Take by mouth.  Marland Kitchen NIFEdipine (NIFEDICAL XL) 30 MG 24 hr tablet Take 30 mg by mouth daily.  Marland Kitchen OVER THE COUNTER MEDICATION Place 1 drop into both  eyes daily. Allergy eye drops  . sitaGLIPtin (JANUVIA) 25 MG tablet Take 1 tablet by mouth daily.  . traZODone (DESYREL) 50 MG tablet Take 50-100 mg by mouth at bedtime as needed. Sleep  . umeclidinium-vilanterol (ANORO ELLIPTA) 62.5-25 MCG/INH AEPB Inhale 1 puff into the lungs daily.  . valsartan-hydrochlorothiazide (DIOVAN-HCT) 320-25 MG tablet Take 1 tablet by mouth daily.  . vitamin C (ASCORBIC ACID) 500 MG tablet Take 500 mg by mouth 2 (two) times daily.   No facility-administered encounter medications on file as of 09/11/2018.     Surgical History: Past Surgical History:  Procedure Laterality Date  . ABDOMINAL HYSTERECTOMY    . 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 EYE SURGERY     bilateral cataracts  . JOINT REPLACEMENT     bilateral knee replacement  . NASAL SEPTUM SURGERY    . REPLACEMENT TOTAL KNEE Bilateral 2004  . SHOULDER ARTHROSCOPY WITH BICEPSTENOTOMY Right 05/25/2016   Procedure: SHOULDER ARTHROSCOPY WITH BICEPSTENOTOMY;  Surgeon: Leanor Kail, MD;  Location: [facility] ORS;  Service: Orthopedics;  Laterality: Right;  . SHOULDER ARTHROSCOPY WITH DISTAL CLAVICLE RESECTION Right 05/25/2016   Procedure: SHOULDER ARTHROSCOPY WITH DISTAL CLAVICLE RESECTION;  Surgeon: Leanor Kail, MD;  Location: [facility] ORS;  Service: Orthopedics;  Laterality: Right;  . SHOULDER ARTHROSCOPY WITH OPEN ROTATOR CUFF REPAIR Right 05/25/2016   Procedure: SHOULDER ARTHROSCOPY WITH OPEN ROTATOR CUFF REPAIR;  Surgeon: Leanor Kail, MD;  Location: [facility] ORS;  Service: Orthopedics;  Laterality: Right;  . SUBACROMIAL DECOMPRESSION Right 05/25/2016   Procedure: SUBACROMIAL DECOMPRESSION;  Surgeon: Leanor Kail, MD;  Location: [facility] ORS;  Service: Orthopedics;  Laterality: Right;    Medical History: Past Medical History:  Diagnosis Date  . Allergy    Seasonal  . Arthritis   . 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 Chronic kidney disease    chronic renal insufficiency  .  Colon polyps 05/21/14  . COPD (chronic obstructive pulmonary disease) ([place])   . Diabetes mellitus without complication ([place])   . Dyspnea   . Fracture closed, humerus, shaft    right   . History of kidney stones    40 years ago  . Hyperlipidemia 04/30/14  . Hypertension   . Impingement syndrome of right shoulder 12/29/15  . Personal history of radiation therapy   . Pneumonia    hx  . Rotator cuff tear 04/21/16   right  . Seizures ([place])   . Sleep apnea     Family History: Family History  Problem Relation Age of Onset  . Breast cancer Paternal Aunt 26  . Diabetes Father   . Heart disease Father     Social History: Social History   Socioeconomic History  . Marital status: Single    Spouse name: Not on file  . Number of children: Not on file  . Years of education: Not on file  . Highest education level: Not on file  Occupational History  . Not on file  Social Needs  . Financial resource strain: Not on file  . Food insecurity:    Worry: Not on file  Inability: Not on file  . Transportation needs:    Medical: Not on file    Non-medical: Not on file  Tobacco Use  . Smoking status: Former Smoker    Packs/day: 1.00    Years: 40.00    Pack years: 40.00    Last attempt to quit: 05/12/2005    Years since quitting: 13.3  . Smokeless tobacco: Never Used  Substance and Sexual Activity  . Alcohol use: No  . Drug use: No  . Sexual activity: Not on file  Lifestyle  . Physical activity:    Days per week: Not on file    Minutes per session: Not on file  . Stress: Not on file  Relationships  . Social connections:    Talks on phone: Not on file    Gets together: Not on file    Attends religious service: Not on file    Active member of club or organization: Not on file    Attends meetings of clubs or organizations: Not on file    Relationship status: Not on file  . Intimate partner violence:    Fear of current or ex partner: Not on file    Emotionally abused: Not on file     Physically abused: Not on file    Forced sexual activity: Not on file  Other Topics Concern  . Not on file  Social History Narrative  . Not on file    Vital Signs: Blood pressure 120/70, pulse 79, resp. rate 16, height 5' 7"  (1.702 m), weight 233 lb (105.7 kg), SpO2 93 %.  Examination: General Appearance: The patient is well-developed, well-nourished, and in no distress. Skin: Gross inspection of skin unremarkable. Head: normocephalic, no gross deformities. Eyes: no gross deformities noted. ENT: ears appear grossly normal no exudates. Neck: Supple. No thyromegaly. No LAD. Respiratory: clear bilaterally . Cardiovascular: Normal S1 and S2 without murmur or rub. Extremities: No cyanosis. pulses are equal. Neurologic: Alert and oriented. No involuntary movements.  LABS: Recent Results (from the past 2160 hour(s))  BCR-ABL1 FISH     Status: None   Collection Time: 08/08/18 12:21 PM  Result Value Ref Range   Specimen Type BLOOD    Cells Counted 200    Cells Analyzed 200    FISH Result Comment:     Comment: NORMAL:  NO BCR OR ABL GENE REARRANGEMENT OBSERVED   Interpretation Comment:     Comment: (NOTE)             nuc ish 9q34(ASS1,ABL1)x2,22q11.2(BCRx2)[200].      The fluorescence in situ hybridization (FISH) study was normal. FISH, using unique sequence DNA probes for the ABL1 and BCR gene regions showed two ABL1 signals (red), two control ASS1 gene signals (aqua) located adjacent to the ABL1 locus at 9q34, and two BCR signals (green) at 22q11.2 in all interphase nuclei examined. There was NO evidence of CML or ALL-associated BCR/ABL1 dual fusion signals in this analysis. .      This analysis is limited to abnormalities detectable by the specific probes included in the study. FISH results should be interpreted within the context of a full cytogenetic analysis and pathology evaluation. .      This test was developed and its performance characteristics determined by  [employer]). It has not been cleared or approved by the U.S. Food and Drug Administration. A BCR-ABL1 gene fusion in greater than 3 interphase nuclei from  a patient with a new clinical diagnosis is considered positive.  The DNA probe vendor for this study was [employer], research (physical sciences)).    Director Review: Comment:     Comment: (NOTE) Perfecto Kingdom, PHD, [place] Performed At: [facility] [place], [place] [id number] Nechama Guard MD AV:[id number] Performed At: [facility] [address] [place], [place] [id number] Rush Farmer MD EM:[id number]   Erythropoietin     Status: None   Collection Time: 08/08/18 12:21 PM  Result Value Ref Range   Erythropoietin 2.9 2.6 - 18.5 mIU/mL    Comment: (NOTE) Beckman [employer] DxI [place] obtained with different assay methods or kits cannot be used interchangeably. Results cannot be interpreted as absolute evidence of the presence or absence of malignant disease. Performed At: [facility] [place], [place] [id number] Rush Farmer MD TG:[id number]   CBC with Differential/Platelet     Status: Abnormal   Collection Time: 08/08/18 12:21 PM  Result Value Ref Range   WBC 16.7 (H) 3.6 - 11.0 K/uL   RBC 5.18 3.80 - 5.20 MIL/uL   Hemoglobin 15.3 12.0 - 16.0 g/dL   HCT 45.5 35.0 - 47.0 %   MCV 87.9 80.0 - 100.0 fL   MCH 29.6 26.0 - 34.0 pg   MCHC 33.7 32.0 - 36.0 g/dL   RDW 13.8 11.5 - 14.5 %   Platelets 552 (H) 150 - 440 K/uL   Neutrophils Relative % 82 %   Neutro Abs 13.8 (H) 1.4 - 6.5 K/uL   Lymphocytes Relative 5 %   Lymphs Abs 0.8 (L) 1.0 - 3.6 K/uL   Monocytes Relative 8 %   Monocytes Absolute 1.3 (H) 0.2 - 0.9 K/uL   Eosinophils Relative 4 %   Eosinophils Absolute 0.6 0 - 0.7 K/uL   Basophils Relative 1 %   Basophils Absolute 0.1 0 - 0.1 K/uL    Comment: Performed at [facility],  [address], [place], [place] [address]  JAK2 V617F, w Reflex to CALR/E12/MPL     Status: Abnormal   Collection Time: 08/08/18 12:21 PM  Result Value Ref Range   JAK2 GenotypR Comment (A)     Comment: (NOTE) Result:  POSITIVE for the detection of the V617F mutation. Interpretation:  The assay detected the presence of a G to T nucleotide change encoding the V617F mutation within JAK2. Interpretation of this result should be made in the context of other clinical, morphologic, and cytogenetic findings.    BACKGROUND: Comment     Comment: (NOTE) JAK2 is a cytoplasmic tyrosine kinase with a key role in signal transduction from multiple hematopoietic growth factor receptors. A point mutation within exon 14 of the JAK2 gene ([id number]) encoding a valine to phenylalanine substitution at position 617 of the JAK2 protein (V617F) has been identified in most patients with polycythemia vera, and in about half of those with either essential thrombocythemia or idiopathic myelofibrosis. The V617F has also been detected, although infrequently, in other myeloid disorders such as chronic myelomonocytic leukemia and chronic neutrophilic luekemia. V617F is an acquired mutation that alters a highly conserved valine present in the negative regulatory JH2 domain of the JAK2 protein and is predicted to dysregulate kinase activity. Methodology: Total genomic DNA was extracted and subjected to TaqMan real-time PCR amplification/detection. Two amplification products per sample were monitored by real-time PCR using primers/probes s pecific to JAK2 wild type (WT) and JAK2 mutant V617F. The ABI7900 Absolute Quantitation software will compare the patient specimen valuse to the standard curves and  generate percent values for wild type and mutant type. In vitro studies have indicated that this assay has an analytical sensitivity of 1%. References: Baxter EJ, Scott Phineas Real, et al. Acquired mutation of  the tyrosine kinase JAK2 in human myeloproliferative disorders. Lancet. 2005 Mar 19-25; 365(9464):1054-1061. Alfonso Ramus Couedic JP. A unique clonal JAK2 mutation leading to constitutive signaling causes polycythaemia vera. Nature. 2005 Apr 28; 434(7037):1144-1148. Kralovics R, Passamonti F, Buser AS, et al. A gain-of-function mutation of JAK2 in myeloproliferative disorders. N Engl J Med. 2005 Apr 28; 352(17):1779-1790.    Director Review, JAK2 Comment     Comment: (NOTE) Katina Degree, MD, PhD Director, [facility] [place], [place] [address] [phone] This test was developed and its performance characteristics determined by [facility]. It has not been cleared or approved by the Food and Drug Administration.    REFLEX: Comment     Comment: (NOTE) Reflex to CALR Mutation Analysis, JAK2 Exon 12-15 Mutation Analysis, and MPL Mutation Analysis is not indicated.    Extraction Completed     Comment: (NOTE) Performed At: [facility] [address] [place], [place] [id number] Nechama Guard MD HT:[id number] Performed At: [facility] RT[facility] [address] [place], [place] [id number] Nechama Guard MD WY:[id number]   Carbon monoxide, blood (performed at ref lab)     Status: None   Collection Time: 08/08/18 12:21 PM  Result Value Ref Range   Carbon Monoxide, Blood 3.0 0.0 - 3.6 %    Comment: (NOTE)                            Environmental Exposure:                             Nonsmokers           <3.7                             Smokers              <9.9                            Occupational Exposure:                             BEI                   3.5                                Detection Limit =  0.2 Performed At: [facility] [place], [place] [id number] Rush Farmer MD EM:[id number]   CBC     Status: Abnormal   Collection Time: 08/29/18  2:52 PM  Result  Value Ref Range   WBC 14.7 (H) 4.0 - 10.5 K/uL   RBC 4.74 3.87 - 5.11 MIL/uL   Hemoglobin 14.3 12.0 - 15.0 g/dL   HCT 42.4 36.0 - 46.0 %   MCV 89.5 80.0 - 100.0 fL   MCH 30.2 26.0 - 34.0 pg   MCHC 33.7 30.0 - 36.0 g/dL   RDW 13.9 11.5 - 15.5 %   Platelets  464 (H) 150 - 400 K/uL   nRBC 0.0 0.0 - 0.2 %    Comment: Performed at [facility], [address] [place], [place] [address]  CBC with Differential/Platelet     Status: Abnormal   Collection Time: 09/03/18  7:51 AM  Result Value Ref Range   WBC 13.9 (H) 4.0 - 10.5 K/uL   RBC 4.73 3.87 - 5.11 MIL/uL   Hemoglobin 14.1 12.0 - 15.0 g/dL   HCT 42.0 36.0 - 46.0 %   MCV 88.8 80.0 - 100.0 fL   MCH 29.8 26.0 - 34.0 pg   MCHC 33.6 30.0 - 36.0 g/dL   RDW 13.7 11.5 - 15.5 %   Platelets 472 (H) 150 - 400 K/uL   nRBC 0.0 0.0 - 0.2 %   Neutrophils Relative % 85 %   Neutro Abs 11.7 (H) 1.7 - 7.7 K/uL   Lymphocytes Relative 4 %   Lymphs Abs 0.6 (L) 0.7 - 4.0 K/uL   Monocytes Relative 6 %   Monocytes Absolute 0.9 0.1 - 1.0 K/uL   Eosinophils Relative 3 %   Eosinophils Absolute 0.4 0.0 - 0.5 K/uL   Basophils Relative 1 %   Basophils Absolute 0.2 (H) 0.0 - 0.1 K/uL   Immature Granulocytes 1 %   Abs Immature Granulocytes 0.17 (H) 0.00 - 0.07 K/uL    Comment: Performed at [facility], [address] [place], [place] [address]  APTT upon arrival     Status: None   Collection Time: 09/03/18  7:51 AM  Result Value Ref Range   aPTT 33 24 - 36 seconds    Comment: Performed at [facility], [address] [place], [place] [address]  Protime-INR upon arrival     Status: None   Collection Time: 09/03/18  7:51 AM  Result Value Ref Range   Prothrombin Time 14.3 11.4 - 15.2 seconds   INR 1.12     Comment: Performed at [facility], [address] [place], [place] [address]  Glucose, capillary     Status: Abnormal   Collection Time: 09/03/18  9:54 AM  Result Value Ref Range   Glucose-Capillary 117  (H) 70 - 99 mg/dL  CBC     Status: Abnormal   Collection Time: 09/05/18  2:39 PM  Result Value Ref Range   WBC 14.5 (H) 4.0 - 10.5 K/uL   RBC 5.03 3.87 - 5.11 MIL/uL   Hemoglobin 15.0 12.0 - 15.0 g/dL   HCT 44.6 36.0 - 46.0 %   MCV 88.7 80.0 - 100.0 fL   MCH 29.8 26.0 - 34.0 pg   MCHC 33.6 30.0 - 36.0 g/dL   RDW 14.0 11.5 - 15.5 %   Platelets 518 (H) 150 - 400 K/uL   nRBC 0.0 0.0 - 0.2 %    Comment: Performed at [facility], [address], [place],  [address]    Radiology: Ct Bone Marrow Biopsy & Aspiration  Result Date: 09/03/2018 INDICATION: History of polycythemia Vera. Please perform CT-guided bone marrow biopsy for tissue diagnostic purposes. EXAM: CT-GUIDED BONE MARROW BIOPSY AND ASPIRATION MEDICATIONS: None ANESTHESIA/SEDATION: Fentanyl 50 mcg IV; Versed 2 mg IV Sedation Time: 19 Minutes; The patient was continuously monitored during the procedure by the interventional radiology nurse under my direct supervision. COMPLICATIONS: None immediate. PROCEDURE: Informed consent was obtained from the patient following an explanation of the procedure, risks, benefits and alternatives. The patient understands, agrees and consents for the procedure. All questions  were addressed. A time out was performed prior to the initiation of the procedure. The patient was positioned left lateral decubitus and non-contrast localization CT was performed of the pelvis to demonstrate the iliac marrow spaces. The operative site was prepped and draped in the usual sterile fashion. Under sterile conditions and local anesthesia, a 22 gauge spinal needle was utilized for procedural planning. Next, an 11 gauge coaxial bone biopsy needle was advanced into the left iliac marrow space. Needle position was confirmed with CT imaging. Initially, bone marrow aspiration was performed. Next, a bone marrow biopsy was obtained with the 11 gauge outer bone marrow device. Samples were prepared with the  cytotechnologist and deemed adequate. The needle was removed intact. Hemostasis was obtained with compression and a dressing was placed. The patient tolerated the procedure well without immediate post procedural complication. IMPRESSION: Successful CT guided left iliac bone marrow aspiration and core biopsy. Electronically Signed   By: Sandi Mariscal M.D.   On: 09/03/2018 10:16    No results found.  Ct Bone Marrow Biopsy & Aspiration  Result Date: 09/03/2018 INDICATION: History of polycythemia Vera. Please perform CT-guided bone marrow biopsy for tissue diagnostic purposes. EXAM: CT-GUIDED BONE MARROW BIOPSY AND ASPIRATION MEDICATIONS: None ANESTHESIA/SEDATION: Fentanyl 50 mcg IV; Versed 2 mg IV Sedation Time: 19 Minutes; The patient was continuously monitored during the procedure by the interventional radiology nurse under my direct supervision. COMPLICATIONS: None immediate. PROCEDURE: Informed consent was obtained from the patient following an explanation of the procedure, risks, benefits and alternatives. The patient understands, agrees and consents for the procedure. All questions were addressed. A time out was performed prior to the initiation of the procedure. The patient was positioned left lateral decubitus and non-contrast localization CT was performed of the pelvis to demonstrate the iliac marrow spaces. The operative site was prepped and draped in the usual sterile fashion. Under sterile conditions and local anesthesia, a 22 gauge spinal needle was utilized for procedural planning. Next, an 11 gauge coaxial bone biopsy needle was advanced into the left iliac marrow space. Needle position was confirmed with CT imaging. Initially, bone marrow aspiration was performed. Next, a bone marrow biopsy was obtained with the 11 gauge outer bone marrow device. Samples were prepared with the cytotechnologist and deemed adequate. The needle was removed intact. Hemostasis was obtained with compression and a  dressing was placed. The patient tolerated the procedure well without immediate post procedural complication. IMPRESSION: Successful CT guided left iliac bone marrow aspiration and core biopsy. Electronically Signed   By: Sandi Mariscal M.D.   On: 09/03/2018 10:16      Assessment and Plan: Patient Active Problem List   Diagnosis Date Noted  . Polycythemia 08/08/2018  . History of repair of right rotator cuff 08/25/2016  . Acute respiratory failure with hypoxia ([place]) 08/06/2016  . Nontraumatic tear of right rotator cuff 04/21/2016  . Atypical chest pain 04/07/2016  . Impingement syndrome of right shoulder 12/29/2015  . Primary osteoarthritis of first carpometacarpal joint of left hand 10/27/2015  . Right shoulder pain 10/27/2015  . OSA on CPAP 10/14/2015  . COPD with asthma ([place]) 07/13/2015  . Type 2 diabetes mellitus with microalbuminuria, without long-term current use of insulin ([place]) 07/13/2015  . Essential hypertension 03/05/2015  . Difficulty sleeping 11/26/2014  . CRI (chronic renal insufficiency) 07/31/2014  . Seizure disorder ([place]) 07/31/2014  . History of colonic polyps 05/21/2014  . Hyperlipidemia, unspecified 04/30/2014   1. OSA on CPAP Encouraged continued compliance using CPAP every  night for greater than 4 hours.  We had a lengthy discussion about replacing tubing, seal, and other replacement parts periodically.  Patient verbalized that she cleans her machine parts with diluted bleach and water.    2. Chronic obstructive pulmonary disease, unspecified COPD type ([place]) Mostly stable at this time.  Patient's shortness of breath and dyspnea on exertion is most likely related to her polycythemia.  Continue using trilogy and albuterol as prescribed.  Continue supportive measures until polycythemia is treated.  3. Nocturnal hypoxia Order for oxygen to get her CPAP placed at this time.  4. Allergic rhinitis, unspecified seasonality, unspecified trigger Encourage patient to  continue using albuterol nebulizer as well as inhaler.  Also continue use of Theolair.  5. Polycythemia Patient is just begin treatment at this time.  She has multiple appointment set up over the next few weeks.  Encourage patient to continue treatment course and we will continue supportive care with her at this time.   General Counseling: I have discussed the findings of the evaluation and examination with Arbie Cookey.  I have also discussed any further diagnostic evaluation thatmay be needed or ordered today. Lashonda verbalizes understanding of the findings of todays visit. We also reviewed her medications today and discussed drug interactions and side effects including but not limited excessive drowsiness and altered mental states. We also discussed that there is always a risk not just to her but also people around her. she has been encouraged to call the office with any questions or concerns that should arise related to todays visit.    Time spent: 25 This patient was seen by Orson Gear AGNP-C in Collaboration with Dr. Devona Konig as a part of collaborative care agreement.   I have personally obtained a history, examined the patient, evaluated laboratory and imaging results, formulated the assessment and plan and placed orders.    Allyne Gee, MD [facility]

## 2018-09-12 ENCOUNTER — Encounter: Payer: Self-pay | Admitting: Hematology and Oncology

## 2018-09-12 ENCOUNTER — Inpatient Hospital Stay: Payer: Medicare Other

## 2018-09-12 ENCOUNTER — Encounter (HOSPITAL_COMMUNITY): Payer: Self-pay | Admitting: Hematology and Oncology

## 2018-09-12 ENCOUNTER — Inpatient Hospital Stay (HOSPITAL_BASED_OUTPATIENT_CLINIC_OR_DEPARTMENT_OTHER): Payer: Medicare Other | Admitting: Hematology and Oncology

## 2018-09-12 VITALS — BP 174/70 | HR 76 | Temp 95.6°F | Resp 18 | Wt 231.3 lb

## 2018-09-12 VITALS — BP 139/80 | HR 73 | Temp 96.6°F | Resp 18

## 2018-09-12 DIAGNOSIS — M129 Arthropathy, unspecified: Secondary | ICD-10-CM | POA: Diagnosis not present

## 2018-09-12 DIAGNOSIS — Z7189 Other specified counseling: Secondary | ICD-10-CM | POA: Insufficient documentation

## 2018-09-12 DIAGNOSIS — N183 Chronic kidney disease, stage 3 (moderate): Secondary | ICD-10-CM

## 2018-09-12 DIAGNOSIS — E785 Hyperlipidemia, unspecified: Secondary | ICD-10-CM

## 2018-09-12 DIAGNOSIS — J449 Chronic obstructive pulmonary disease, unspecified: Secondary | ICD-10-CM | POA: Diagnosis not present

## 2018-09-12 DIAGNOSIS — Z87891 Personal history of nicotine dependence: Secondary | ICD-10-CM

## 2018-09-12 DIAGNOSIS — Z86010 Personal history of colonic polyps: Secondary | ICD-10-CM

## 2018-09-12 DIAGNOSIS — Z79899 Other long term (current) drug therapy: Secondary | ICD-10-CM | POA: Diagnosis not present

## 2018-09-12 DIAGNOSIS — I129 Hypertensive chronic kidney disease with stage 1 through stage 4 chronic kidney disease, or unspecified chronic kidney disease: Secondary | ICD-10-CM | POA: Diagnosis not present

## 2018-09-12 DIAGNOSIS — Z7982 Long term (current) use of aspirin: Secondary | ICD-10-CM

## 2018-09-12 DIAGNOSIS — D45 Polycythemia vera: Secondary | ICD-10-CM | POA: Diagnosis not present

## 2018-09-12 DIAGNOSIS — D751 Secondary polycythemia: Secondary | ICD-10-CM

## 2018-09-12 DIAGNOSIS — E119 Type 2 diabetes mellitus without complications: Secondary | ICD-10-CM | POA: Diagnosis not present

## 2018-09-12 LAB — CBC WITH DIFFERENTIAL/PLATELET
Abs Immature Granulocytes: 0.15 10*3/uL — ABNORMAL HIGH (ref 0.00–0.07)
Basophils Absolute: 0.2 10*3/uL — ABNORMAL HIGH (ref 0.0–0.1)
Basophils Relative: 1 %
Eosinophils Absolute: 0.5 10*3/uL (ref 0.0–0.5)
Eosinophils Relative: 3 %
HCT: 43.4 % (ref 36.0–46.0)
Hemoglobin: 14.7 g/dL (ref 12.0–15.0)
Immature Granulocytes: 1 %
Lymphocytes Relative: 4 %
Lymphs Abs: 0.6 10*3/uL — ABNORMAL LOW (ref 0.7–4.0)
MCH: 30.2 pg (ref 26.0–34.0)
MCHC: 33.9 g/dL (ref 30.0–36.0)
MCV: 89.3 fL (ref 80.0–100.0)
Monocytes Absolute: 1 10*3/uL (ref 0.1–1.0)
Monocytes Relative: 7 %
Neutro Abs: 12.7 10*3/uL — ABNORMAL HIGH (ref 1.7–7.7)
Neutrophils Relative %: 84 %
Platelets: 537 10*3/uL — ABNORMAL HIGH (ref 150–400)
RBC: 4.86 MIL/uL (ref 3.87–5.11)
RDW: 14.1 % (ref 11.5–15.5)
WBC: 15.1 10*3/uL — ABNORMAL HIGH (ref 4.0–10.5)
nRBC: 0 % (ref 0.0–0.2)

## 2018-09-12 LAB — COMPREHENSIVE METABOLIC PANEL
ALT: 28 U/L (ref 0–44)
AST: 32 U/L (ref 15–41)
Albumin: 4.1 g/dL (ref 3.5–5.0)
Alkaline Phosphatase: 70 U/L (ref 38–126)
Anion gap: 9 (ref 5–15)
BUN: 20 mg/dL (ref 8–23)
CO2: 26 mmol/L (ref 22–32)
Calcium: 9.6 mg/dL (ref 8.9–10.3)
Chloride: 102 mmol/L (ref 98–111)
Creatinine, Ser: 0.98 mg/dL (ref 0.44–1.00)
GFR calc Af Amer: >60 mL/min (ref >60)
GFR calc non Af Amer: 55 mL/min — ABNORMAL LOW (ref >60)
Glucose, Bld: 144 mg/dL — ABNORMAL HIGH (ref 70–99)
Potassium: 3.8 mmol/L (ref 3.5–5.1)
Sodium: 137 mmol/L (ref 135–145)
Total Bilirubin: 0.6 mg/dL (ref 0.3–1.2)
Total Protein: 6.6 g/dL (ref 6.5–8.1)

## 2018-09-12 MED ORDER — HYDROXYUREA 500 MG PO CAPS: 500.0000 mg | ORAL_CAPSULE | Freq: Every day | ORAL | 0 refills | Status: DC

## 2018-09-12 NOTE — Patient Instructions (Signed)
Hydroxyurea capsules What is this medicine? HYDROXYUREA (hye drox ee yoor EE a) is a chemotherapy drug. This medicine is used to treat certain types of leukemias and head and neck cancer. It is also used to control the painful crises of sickle cell anemia. This medicine may be used for other purposes; ask your health care provider or pharmacist if you have questions. COMMON BRAND NAME(S): Droxia, Hydrea What should I tell my health care provider before I take this medicine? They need to know if you have any of these conditions: -gout or high levels of uric acid in the blood -HIV or AIDS -kidney disease or on hemodialysis -leg wounds or ulcers -low blood counts, like low white cell, platelet, or red cell counts -prior or current interferon therapy -recent or ongoing radiation therapy -scheduled to receive a vaccine -an unusual or allergic reaction to hydroxyurea, other medicines, foods, dyes, or preservatives -pregnant or trying to get pregnant -breast-feeding How should I use this medicine? Take this medicine by mouth with a glass of water. Follow the directions on the prescription label. Take your medicine at regular intervals. Do not take it more often than directed. Do not stop taking except on your doctor's advice. People who are not taking this medicine should not be exposed to it. Wash your hands before and after handling your bottle or medicine. Caregivers should wear disposable gloves if they must touch the bottle or medicine. Clean up any medicine powder that spills with a damp disposable towel and throw the towel away in a closed container, such as a plastic bag. A special MedGuide will be given to you by the pharmacist with each prescription and refill of Droxyia. Be sure to read this information carefully each time. Talk to your pediatrician regarding the use of this medicine in children. Special care may be needed. Overdosage: If you think you have taken too much of this medicine  contact a poison control center or emergency room at once. NOTE: This medicine is only for you. Do not share this medicine with others. What if I miss a dose? If you miss a dose, take it as soon as you can. If it is almost time for your next dose, take only that dose. Do not take double or extra doses. What may interact with this medicine? This medicine may also interact with the following medications: -didanosine -stavudine -live virus vaccines This list may not describe all possible interactions. Give your health care provider a list of all the medicines, herbs, non-prescription drugs, or dietary supplements you use. Also tell them if you smoke, drink alcohol, or use illegal drugs. Some items may interact with your medicine. What should I watch for while using this medicine? This drug may make you feel generally unwell. This is not uncommon, as chemotherapy can affect healthy cells as well as cancer cells. Report any side effects. Continue your course of treatment even though you feel ill unless your doctor tells you to stop. You will receive regular blood tests during your treatment. Call your doctor or health care professional for advice if you get a fever, chills or sore throat, or other symptoms of a cold or flu. Do not treat yourself. This drug decreases your body's ability to fight infections. Try to avoid being around people who are sick. This medicine may increase your risk to bruise or bleed. Call your doctor or health care professional if you notice any unusual bleeding. Talk to your doctor about your risk of cancer. You may  be more at risk for certain types of cancers if you take this medicine. Keep out of the sun. If you cannot avoid being in the sun, wear protective clothing and use sunscreen. Do not use sun lamps or tanning beds/booths. Do not become pregnant while taking this medicine or for at least 6 months after stopping it. Women should inform their doctor if they wish to become  pregnant or think they might be pregnant. Men should not father a child while taking this medicine and for at least a year after stopping it. There is a potential for serious side effects to an unborn child. Talk to your health care professional or pharmacist for more information. Do not breast-feed an infant while taking this medicine. This may interfere with the ability to have or father a child. You should talk with your doctor or health care professional if you are concerned about your fertility. What side effects may I notice from receiving this medicine? Side effects that you should report to your doctor or health care professional as soon as possible: -allergic reactions like skin rash, itching or hives, swelling of the face, lips, or tongue -breathing problems -burning, redness or pain at the site of any radiation therapy -low blood counts - this medicine may decrease the number of white blood cells, red blood cells and platelets. You may be at increased risk for infections and bleeding. -signs of decreased platelets or bleeding - bruising, pinpoint red spots on the skin, black, tarry stools, blood in the urine -signs of decreased red blood cells - unusually weak or tired, fainting spells, lightheadedness -signs of infection - fever or chills, cough, sore throat, pain or difficulty passing urine -signs and symptoms of bleeding such as bloody or black, tarry stools; red or dark-brown urine; spitting up blood or brown material that looks like coffee grounds; red spots on the skin; unusual bruising or bleeding from the eye, gums, or nose -skin ulcers Side effects that usually do not require medical attention (report to your doctor or health care professional if they continue or are bothersome): -constipation -diarrhea -loss of appetite -mouth sores -nausea This list may not describe all possible side effects. Call your doctor for medical advice about side effects. You may report side effects  to FDA at 1-800-FDA-1088. Where should I keep my medicine? Keep out of the reach of children. See product for storage instructions. Each product may have different instructions. Keep tightly closed. Throw away any unused medicine after the expiration date. NOTE: This sheet is a summary. It may not cover all possible information. If you have questions about this medicine, talk to your doctor, pharmacist, or health care provider.  2018 Elsevier/Gold Standard (2016-11-03 11:43:13) Polycythemia Vera Polycythemia vera (PV), or myeloproliferative disease, is a form of blood cancer in which the bone marrow makes too many (overproduces) red blood cells. The bone marrow may also make too many clotting cells (platelets) and white blood cells. Bone marrow is the spongy center of bones where blood cells are produced. Sometimes, there may be an overproduction of blood cells in the liver and spleen, causing those organs to become enlarged. Additionally, people who have PV are at a higher risk for stroke or heart attack because their blood may clot more easily. PV is a long-term disease. What are the causes? Almost all people who have PV have an abnormal gene (genetic mutation) that causes changes in the way that the bone marrow makes blood cells. This gene, which is called JAK2,   is not passed along from parent to child (is not hereditary). It is not known what triggers the genetic mutation that causes the body to produce too many red blood cells. What increases the risk? This condition is more likely to develop in:  Males.  People who are 94 years of age or older.  What are the signs or symptoms? You may not have any symptoms in the early stage of PV. When symptoms develop, they may include:  Shortness of breath.  Dizziness.  Hot and flushed skin.  Itchy skin.  Sweats, especially night sweats.  Headache.  Tiredness.  Ringing in the ears.  Blurred vision or blind spots.  Bone pain.  Weight  loss.  Fever.  Blood-tinged vomit or bowel movements.  How is this diagnosed? This condition may be diagnosed during a routine physical exam if you have a blood test called a complete blood count (CBC). Your health care provider also may suspect PV if you have symptoms. During the physical exam, your provider may find that you have an enlarged liver or spleen. You may also have tests to confirm the diagnosis. These may include:  A procedure to remove a sample of bone marrow for testing (bone marrow biopsy).  Blood tests to check for: ? The JAK2 gene. ? Low levels of a hormone that helps to regulate blood production (erythropoietin).  How is this treated? There is no cure for PV, but treatment can help to control the disease. There are several types of treatment. No single treatment works for everyone. You will need to work with a blood cancer specialist (hematologist) to find the treatment that is best for you. Options include:  Periodically having some blood removed with a needle (drawn) to lower the number of red blood cells (phlebotomy).  Medicine. Your health care provider may recommend: ? Low-dose aspirin to lower your risk for blood clots. ? A medicine to reduce red blood cell production (hydroxyurea). ? A medicine to lower the number of red blood cells (interferon). ? A medicine that slows down the effects of JAK2 (ruxolitinib).  Follow these instructions at home:  Take over-the-counter and prescription medicines only as told by your health care provider.  Return to your normal activities as told by your health care provider. Ask your health care provider what activities are safe for you.  Do not use tobacco products, including cigarettes, chewing tobacco, or e-cigarettes. If you need help quitting, ask your health care provider.  Keep all follow-up visits as told by your health care provider. This is important. Contact a health care provider if:  You have side effects  from your medicines.  Your symptoms change or get worse at home.  You have blood in your stool or you vomit blood. Get help right away if:  You have sudden and severe pain in your abdomen.  You have chest pain or difficulty breathing.  You have signs of stroke, such as: ? Sudden numbness. ? Weakness of your face or arm. ? Confusion. ? Difficulty speaking or understanding speech. These symptoms may represent a serious problem that is an emergency. Do not wait to see if the symptoms will go away. Get medical help right away. Call your local emergency services (911 in the [place] Do not drive yourself to the hospital. This information is not intended to replace advice given to you by your health care provider. Make sure you discuss any questions you have with your health care provider. Document Released: 07/26/2001 Document Revised: 04/07/2016  Document Reviewed: 05/13/2015 Elsevier Interactive Patient Education  [employer].

## 2018-09-12 NOTE — Progress Notes (Signed)
[facility] day:  09/12/2018  Chief Complaint: ANACAREN KOHAN is a 77 y.o. 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  HPI:  The patient is was last seen in the hematology clinic on 08/22/2018.  At that time, she felt "ok".  She denied any B symptoms.  We discussed her initial work-up.  Testing revealed +JAK2.  She was felt to have polycythemia rubra vera.  Bone marrow was scheduled.  She began a phlebotomy program.  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  There was no significant dysplasia or increased blasts.  Overall features were c/w polycythemia rubra vera.  She has had difficulty with IV access.  She underwent phlebotomy of 250 cc on 09/07/2018.  During the interim, patient doing well overall.  She feels generally well. She denies any chest pain episodes. She has exertional dyspnea. She has orthopnea when supine. She sleeps on her side. She notes that she often wakes up at night with increased shortness of breath. Patient has known OSAH and is on nocturnal PAP therapy. Recent change from Anoro to Trelegy. Had recent ONO that demonstrated the need for supplemental oxygen bleed in with current PAP therapy.  She is followed by Dr. Devona Konig. Plans are for a 6 minute walk soon to determine need for supplemental oxygen therapy.   Patient denies bleeding; no hematochezia, melena, or gross hematuria. Patient denies that she has experienced any B symptoms. She denies any interval infections.   Patient advises that she maintains an adequate appetite. She is eating well. Weight today is 231 lb 4.2 oz (104.9 kg), which compared to her last visit to the clinic, represents a 3 pound increase.   Patient denies pain in the clinic today.   Past Medical History:  Diagnosis Date  . Allergy    Seasonal  .  Arthritis   . 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 Chronic kidney disease    chronic renal insufficiency  . Colon polyps 05/21/14  . COPD (chronic obstructive pulmonary disease) ([place])   . Diabetes mellitus without complication ([place])   . Dyspnea   . Fracture closed, humerus, shaft    right   . History of kidney stones    40 years ago  . Hyperlipidemia 04/30/14  . Hypertension   . Impingement syndrome of right shoulder 12/29/15  . Personal history of radiation therapy   . Pneumonia    hx  . Rotator cuff tear 04/21/16   right  . Seizures ([place])   . Sleep apnea     Past Surgical History:  Procedure Laterality Date  . ABDOMINAL HYSTERECTOMY    . 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 EYE SURGERY     bilateral cataracts  . JOINT REPLACEMENT     bilateral knee replacement  . NASAL SEPTUM SURGERY    . REPLACEMENT TOTAL KNEE Bilateral 2004  . SHOULDER ARTHROSCOPY WITH BICEPSTENOTOMY Right 05/25/2016   Procedure: SHOULDER ARTHROSCOPY WITH BICEPSTENOTOMY;  Surgeon: Leanor Kail, MD;  Location: [facility] ORS;  Service: Orthopedics;  Laterality: Right;  . SHOULDER ARTHROSCOPY WITH DISTAL CLAVICLE RESECTION Right 05/25/2016   Procedure: SHOULDER ARTHROSCOPY WITH DISTAL CLAVICLE RESECTION;  Surgeon: Leanor Kail, MD;  Location: [facility] ORS;  Service: Orthopedics;  Laterality: Right;  . SHOULDER ARTHROSCOPY WITH OPEN ROTATOR CUFF REPAIR Right  05/25/2016   Procedure: SHOULDER ARTHROSCOPY WITH OPEN ROTATOR CUFF REPAIR;  Surgeon: Leanor Kail, MD;  Location: [facility] ORS;  Service: Orthopedics;  Laterality: Right;  . SUBACROMIAL DECOMPRESSION Right 05/25/2016   Procedure: SUBACROMIAL DECOMPRESSION;  Surgeon: Leanor Kail, MD;  Location: [facility] ORS;  Service: Orthopedics;  Laterality: Right;    Family History  Problem Relation Age of Onset  . Breast cancer Paternal Aunt 91  . Diabetes Father   . Heart disease Father     Social History:  reports that she quit smoking about  13 years ago. She has a 40.00 pack-year smoking history. She has never used smokeless tobacco. She reports that she does not drink alcohol or use drugs.  Patient moved to [place] in 2001 from [place]. Patient is a former 1 ppd smoker x 40 years; quit in 2006. She is retired from [employer]. Patient denies known exposures to radiation on toxins. The patient is accompanied by her significant other, Elpidio Eric, today.  Allergies:  Allergies  Allergen Reactions  . Iodinated Diagnostic Agents Anaphylaxis    Other reaction(s): Other (See Comments) Throat swells and extreme hives  . Latex Itching  . Phenobarbital Hives  . Tape Rash    silicones    Current Medications: Current Outpatient Medications  Medication Sig Dispense Refill  . albuterol (ACCUNEB) 1.25 MG/3ML nebulizer solution Take 1 ampule by nebulization every 6 (six) hours as needed. wheezing  3  . albuterol (ACCUNEB) 1.25 MG/3ML nebulizer solution USE 1 VIAL VIA NEBULIZER EVERY 6 HOURS AS NEEDED FOR WHEEZING    . albuterol (PROVENTIL HFA;VENTOLIN HFA) 108 (90 Base) MCG/ACT inhaler Inhale 2 puffs into the lungs every 6 (six) hours as needed. 3 Inhaler 2  . aspirin 81 MG tablet Take 81 mg by mouth daily.    Marland Kitchen atorvastatin (LIPITOR) 40 MG tablet Take 40 mg by mouth daily.    . B Complex Vitamins (VITAMIN B COMPLEX PO) Take 1 tablet by mouth daily.    . Calcium Carbonate-Vitamin D 600-200 MG-UNIT CAPS Take 1 capsule by mouth daily.    . cloNIDine (CATAPRES) 0.2 MG tablet Take 0.2 mg by mouth 2 (two) times daily.    . diphenhydrAMINE (BENADRYL) 25 mg capsule Take 25 mg by mouth daily.    Marland Kitchen EPINEPHrine (EPIPEN 2-PAK) 0.3 mg/0.3 mL IJ SOAJ injection 0.3 mg once. Reported on 05/25/2016    . fluticasone (FLONASE) 50 MCG/ACT nasal spray Place 2 sprays into both nostrils daily. 16 g 2  . Fluticasone-Umeclidin-Vilant (TRELEGY ELLIPTA IN) Inhale 1 puff into the lungs daily.    . Fluticasone-Umeclidin-Vilant (TRELEGY ELLIPTA) 100-62.5-25 MCG/INH AEPB Inhale  1 puff into the lungs daily. 1 each 4  . furosemide (LASIX) 20 MG tablet Take 1 tablet (20 mg total) by mouth 2 (two) times daily. 60 tablet 0  . glucose blood (ONE TOUCH ULTRA TEST) test strip USE ONE STRIP TO CHECK GLUCOSE ONCE DAILY    . hydrALAZINE (APRESOLINE) 100 MG tablet Take 50 mg by mouth 3 (three) times daily.     Marland Kitchen levETIRAcetam (KEPPRA) 750 MG tablet Take 750 mg by mouth 2 (two) times daily.    . metFORMIN (GLUCOPHAGE) 500 MG tablet Take 500 mg by mouth 2 (two) times daily with a meal.    . metoprolol succinate (TOPROL-XL) 100 MG 24 hr tablet Take 100 mg by mouth daily.    . montelukast (SINGULAIR) 10 MG tablet TAKE 1 TABLET BY MOUTH EVERY DAY 90 tablet 0  . Multiple Vitamin (MULTI-VITAMINS) TABS Take  1 tablet by mouth daily.    . Multiple Vitamins-Minerals (MULTIVITAMIN WITH MINERALS) tablet Take 1 tablet by mouth daily.    . Multiple Vitamins-Minerals (PRESERVISION AREDS 2+MULTI VIT PO) Take by mouth.    Marland Kitchen NIFEdipine (NIFEDICAL XL) 30 MG 24 hr tablet Take 30 mg by mouth daily.    Marland Kitchen OVER THE COUNTER MEDICATION Place 1 drop into both eyes daily. Allergy eye drops    . sitaGLIPtin (JANUVIA) 25 MG tablet Take 1 tablet by mouth daily.    . traZODone (DESYREL) 50 MG tablet Take 50-100 mg by mouth at bedtime as needed. Sleep  0  . valsartan-hydrochlorothiazide (DIOVAN-HCT) 320-25 MG tablet Take 1 tablet by mouth daily.  0  . vitamin C (ASCORBIC ACID) 500 MG tablet Take 500 mg by mouth 2 (two) times daily.     No current facility-administered medications for this visit.     Review of Systems  Constitutional: Negative.  Negative for chills, diaphoresis, fever, malaise/fatigue and weight loss.  HENT: Negative for congestion, ear discharge, ear pain, nosebleeds and sore throat.   Eyes: Negative.  Negative for double vision, photophobia, pain and discharge.       Macular degeneration s/p LEFT eye injection on 08/06/2018.  Respiratory: Positive for shortness of breath (exertional).  Negative for cough, hemoptysis and sputum production.        COPD.  Sleep apnea.   Cardiovascular: Positive for orthopnea. Negative for chest pain, palpitations, leg swelling and PND.  Gastrointestinal: Negative for abdominal pain, blood in stool, constipation, diarrhea, melena, nausea and vomiting.  Genitourinary: Negative for dysuria, frequency, hematuria and urgency.  Musculoskeletal: Positive for back pain and joint pain (DDD; OA). Negative for falls and myalgias.  Skin: Negative.  Negative for itching and rash.  Neurological: Negative for dizziness, tremors, sensory change, speech change, focal weakness, weakness and headaches.  Endo/Heme/Allergies: Positive for environmental allergies (seasonal). Does not bruise/bleed easily.  Psychiatric/Behavioral: Negative for depression, memory loss and suicidal ideas. The patient is not nervous/anxious and does not have insomnia.   All other systems reviewed and are negative.  Performance status (ECOG): 0  Vital Signs: BP (!) 174/70 (BP Location: Right Arm, Patient Position: Sitting)   Pulse 76   Temp (!) 95.6 F (35.3 C) (Tympanic)   Resp 18   Wt 231 lb 4.2 oz (104.9 kg)   BMI 36.22 kg/m   Physical Exam  Constitutional: She is oriented to person, place, and time and well-developed, well-nourished, and in no distress.  HENT:  Head: Normocephalic and atraumatic.  Mouth/Throat: Oropharynx is clear and moist and mucous membranes are normal. She has dentures.  Short gray hair.  Eyes: Pupils are equal, round, and reactive to light. Conjunctivae and EOM are normal. No scleral icterus.  Glasses.  Brown eyes.  Neck: Normal range of motion. Neck supple. No JVD present.  Cardiovascular: Normal rate, regular rhythm, normal heart sounds and intact distal pulses. Exam reveals no gallop and no friction rub.  No murmur heard. Pulmonary/Chest: Effort normal and breath sounds normal. No respiratory distress. She has no wheezes. She has no rales.   Abdominal: Soft. Bowel sounds are normal. She exhibits no distension and no mass. There is no tenderness. There is no rebound and no guarding.  Musculoskeletal: Normal range of motion. She exhibits no edema or tenderness.  Lymphadenopathy:    She has no cervical adenopathy.  Neurological: She is alert and oriented to person, place, and time.  Skin: Skin is warm and dry. Bruising (left upper  extremity) noted. No rash noted. No erythema.  Psychiatric: Mood, affect and judgment normal.  Nursing note and vitals reviewed.   Appointment on 09/12/2018  Component Date Value Ref Range Status  . Sodium 09/12/2018 137  135 - 145 mmol/L Final  . Potassium 09/12/2018 3.8  3.5 - 5.1 mmol/L Final  . Chloride 09/12/2018 102  98 - 111 mmol/L Final  . CO2 09/12/2018 26  22 - 32 mmol/L Final  . Glucose, Bld 09/12/2018 144* 70 - 99 mg/dL Final  . BUN 09/12/2018 20  8 - 23 mg/dL Final  . Creatinine, Ser 09/12/2018 0.98  0.44 - 1.00 mg/dL Final  . Calcium 09/12/2018 9.6  8.9 - 10.3 mg/dL Final  . Total Protein 09/12/2018 6.6  6.5 - 8.1 g/dL Final  . Albumin 09/12/2018 4.1  3.5 - 5.0 g/dL Final  . AST 09/12/2018 32  15 - 41 U/L Final  . ALT 09/12/2018 28  0 - 44 U/L Final  . Alkaline Phosphatase 09/12/2018 70  38 - 126 U/L Final  . Total Bilirubin 09/12/2018 0.6  0.3 - 1.2 mg/dL Final  . GFR calc non Af Amer 09/12/2018 55* >60 mL/min Final  . GFR calc Af Amer 09/12/2018 >60  >60 mL/min Final   Comment: (NOTE) The eGFR has been calculated using the CKD EPI equation. This calculation has not been validated in all clinical situations. eGFR's persistently <60 mL/min signify possible Chronic Kidney Disease.   Georgiann Hahn gap 09/12/2018 9  5 - 15 Final   Performed at [facility], [address] [place], [place] [address]  . WBC 09/12/2018 15.1* 4.0 - 10.5 K/uL Final  . RBC 09/12/2018 4.86  3.87 - 5.11 MIL/uL Final  . Hemoglobin 09/12/2018 14.7  12.0 - 15.0 g/dL Final  . HCT 09/12/2018 43.4   36.0 - 46.0 % Final  . MCV 09/12/2018 89.3  80.0 - 100.0 fL Final  . MCH 09/12/2018 30.2  26.0 - 34.0 pg Final  . MCHC 09/12/2018 33.9  30.0 - 36.0 g/dL Final  . RDW 09/12/2018 14.1  11.5 - 15.5 % Final  . Platelets 09/12/2018 537* 150 - 400 K/uL Final  . nRBC 09/12/2018 0.0  0.0 - 0.2 % Final  . Neutrophils Relative % 09/12/2018 84  % Final  . Neutro Abs 09/12/2018 12.7* 1.7 - 7.7 K/uL Final  . Lymphocytes Relative 09/12/2018 4  % Final  . Lymphs Abs 09/12/2018 0.6* 0.7 - 4.0 K/uL Final  . Monocytes Relative 09/12/2018 7  % Final  . Monocytes Absolute 09/12/2018 1.0  0.1 - 1.0 K/uL Final  . Eosinophils Relative 09/12/2018 3  % Final  . Eosinophils Absolute 09/12/2018 0.5  0.0 - 0.5 K/uL Final  . Basophils Relative 09/12/2018 1  % Final  . Basophils Absolute 09/12/2018 0.2* 0.0 - 0.1 K/uL Final  . Immature Granulocytes 09/12/2018 1  % Final  . Abs Immature Granulocytes 09/12/2018 0.15* 0.00 - 0.07 K/uL Final   Performed at [facility], [address], [place], [place] [address]    Assessment:  TEMEKIA CASKEY is a 77 y.o. female with polycythemia rubra vera.  She has had sleep apnea x 7 years and uses CPAP.  She has a 40 pack year smoking history (stopped 13 years ago).  She denies and cardiac history.  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  There was no significant dysplasia or increased blasts.  Overall features were  c/w polycythemia rubra vera.  Work up on 08/08/2018 revealed a WBC of 16,700 (P[place] 13,800). Hemoglobin 15.3, hematocrit 45.5, MCV 87.9, and platelets 552,000. Erythropoietin level was normal at 2.9 mIU/mL. BCR/ABL demonstrated no BCR or ABL gene rearrangements. Carbon monoxide level was normal at 3.0% (0-3.6 %).  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   She began a phlebotomy program on 09/07/2018.  Hematocrit goal is <=42.  Symptomatically, patient is doing well overall. She complains of  being more short of breath. (+) orthopnea at night. Recent ONO demonstrates need for supplemental oxygen to be bled in with her current nocturnal PAP therapy. Exam reveals left upper extremity bruising.  WBC 15,100 (ANC 12,700).  Hemoglobin 14.7, hematocrit 43.4, and platelets 537,000.  Plan: 1. Labs today:  CBC with diff, CMP. 2. Polycythemia rubra vera (PV)  Review bone marrow  Aspirate and biopsy confirmed JAK2 (+) MPN with focal mild fibrosis c/w polycythemia rubra vera (PV).    Previous peripheral blood was JAK2 (+) and BCR/ABL (-)   Felt "better" following therapeutic phlebotomy x1.  Labs reviewed.  Hematocrit 43.4.  Will proceed with small-volume therapeutic phlebotomy today.  Platelets 537,000. Goal is < 400,000.  Discuss initiation of hydroxyurea 500 mg daily. Side effects reviewed. Written information again provided.  3. RTC every 2 weeks for labs (CBC ) +/- phlebotomy. 4. RTC in 4 weeks for MD assessment and labs (CBC with diff, CMP).  A total of (22) minutes of face-to-face time was spent with the patient with greater than 50% of that time in counseling and care-coordination.    Honor Loh, NP  09/12/2018, 10:56 AM   I saw and evaluated the patient, participating in the key portions of the service and reviewing pertinent diagnostic studies and records.  I reviewed the nurse practitioner's note and agree with the findings and the plan.  The assessment and plan were discussed with the patient.  Several questions were asked by the patient and answered.   Nolon Stalls, MD 09/12/2018,10:56 AM

## 2018-09-12 NOTE — Progress Notes (Signed)
Patient here today for bone marrow results.

## 2018-09-13 ENCOUNTER — Encounter: Payer: Self-pay | Admitting: Internal Medicine

## 2018-09-13 ENCOUNTER — Other Ambulatory Visit: Payer: Self-pay | Admitting: Adult Health

## 2018-09-13 DIAGNOSIS — Z9989 Dependence on other enabling machines and devices: Principal | ICD-10-CM

## 2018-09-13 DIAGNOSIS — G4733 Obstructive sleep apnea (adult) (pediatric): Secondary | ICD-10-CM

## 2018-09-17 ENCOUNTER — Ambulatory Visit: Payer: Self-pay

## 2018-09-18 ENCOUNTER — Ambulatory Visit: Payer: Medicare Other | Admitting: Hematology and Oncology

## 2018-09-19 ENCOUNTER — Inpatient Hospital Stay: Payer: Medicare Other | Admitting: Hematology and Oncology

## 2018-09-20 ENCOUNTER — Ambulatory Visit: Payer: Medicare Other | Admitting: Hematology and Oncology

## 2018-09-25 ENCOUNTER — Inpatient Hospital Stay: Payer: Medicare Other

## 2018-09-25 ENCOUNTER — Inpatient Hospital Stay: Payer: Medicare Other | Attending: Hematology and Oncology

## 2018-09-25 DIAGNOSIS — D45 Polycythemia vera: Secondary | ICD-10-CM | POA: Diagnosis not present

## 2018-09-25 DIAGNOSIS — R110 Nausea: Secondary | ICD-10-CM | POA: Insufficient documentation

## 2018-09-25 DIAGNOSIS — R197 Diarrhea, unspecified: Secondary | ICD-10-CM | POA: Insufficient documentation

## 2018-09-25 DIAGNOSIS — D751 Secondary polycythemia: Secondary | ICD-10-CM

## 2018-09-25 LAB — CBC WITH DIFFERENTIAL/PLATELET
Abs Immature Granulocytes: 0.06 10*3/uL (ref 0.00–0.07)
Basophils Absolute: 0.1 10*3/uL (ref 0.0–0.1)
Basophils Relative: 1 %
Eosinophils Absolute: 0.4 10*3/uL (ref 0.0–0.5)
Eosinophils Relative: 3 %
HCT: 44.2 % (ref 36.0–46.0)
Hemoglobin: 14.7 g/dL (ref 12.0–15.0)
Immature Granulocytes: 1 %
Lymphocytes Relative: 5 %
Lymphs Abs: 0.6 10*3/uL — ABNORMAL LOW (ref 0.7–4.0)
MCH: 29.7 pg (ref 26.0–34.0)
MCHC: 33.3 g/dL (ref 30.0–36.0)
MCV: 89.3 fL (ref 80.0–100.0)
Monocytes Absolute: 0.8 10*3/uL (ref 0.1–1.0)
Monocytes Relative: 7 %
Neutro Abs: 9.8 10*3/uL — ABNORMAL HIGH (ref 1.7–7.7)
Neutrophils Relative %: 83 %
Platelets: 378 10*3/uL (ref 150–400)
RBC: 4.95 MIL/uL (ref 3.87–5.11)
RDW: 14.3 % (ref 11.5–15.5)
WBC: 11.9 10*3/uL — ABNORMAL HIGH (ref 4.0–10.5)
nRBC: 0 % (ref 0.0–0.2)

## 2018-09-25 NOTE — Progress Notes (Signed)
Patient here for phlebotomy today.  Hct is 44.2.  Patient reports that her medication is making her not feel good.  SHe says that she does not have an appetite and no energy.

## 2018-09-25 NOTE — Patient Instructions (Signed)

## 2018-09-26 ENCOUNTER — Encounter: Payer: Medicare Other | Admitting: Internal Medicine

## 2018-09-26 DIAGNOSIS — Z9989 Dependence on other enabling machines and devices: Principal | ICD-10-CM

## 2018-09-26 DIAGNOSIS — G4733 Obstructive sleep apnea (adult) (pediatric): Secondary | ICD-10-CM

## 2018-10-05 ENCOUNTER — Ambulatory Visit: Payer: Self-pay | Admitting: Adult Health

## 2018-10-08 DIAGNOSIS — H353221 Exudative age-related macular degeneration, left eye, with active choroidal neovascularization: Secondary | ICD-10-CM | POA: Diagnosis not present

## 2018-10-10 ENCOUNTER — Encounter: Payer: Self-pay | Admitting: Hematology and Oncology

## 2018-10-10 ENCOUNTER — Other Ambulatory Visit: Payer: Self-pay

## 2018-10-10 ENCOUNTER — Inpatient Hospital Stay (HOSPITAL_BASED_OUTPATIENT_CLINIC_OR_DEPARTMENT_OTHER): Payer: Medicare Other | Admitting: Hematology and Oncology

## 2018-10-10 ENCOUNTER — Inpatient Hospital Stay: Payer: Medicare Other

## 2018-10-10 VITALS — BP 116/66 | HR 65 | Temp 97.9°F | Resp 18 | Wt 220.0 lb

## 2018-10-10 DIAGNOSIS — R110 Nausea: Secondary | ICD-10-CM

## 2018-10-10 DIAGNOSIS — D45 Polycythemia vera: Secondary | ICD-10-CM

## 2018-10-10 DIAGNOSIS — R197 Diarrhea, unspecified: Secondary | ICD-10-CM | POA: Diagnosis not present

## 2018-10-10 LAB — COMPREHENSIVE METABOLIC PANEL
ALT: 37 U/L (ref 0–44)
AST: 35 U/L (ref 15–41)
Albumin: 4.2 g/dL (ref 3.5–5.0)
Alkaline Phosphatase: 77 U/L (ref 38–126)
Anion gap: 9 (ref 5–15)
BUN: 15 mg/dL (ref 8–23)
CO2: 27 mmol/L (ref 22–32)
Calcium: 10.1 mg/dL (ref 8.9–10.3)
Chloride: 101 mmol/L (ref 98–111)
Creatinine, Ser: 0.83 mg/dL (ref 0.44–1.00)
GFR calc Af Amer: >60 mL/min (ref >60)
GFR calc non Af Amer: >60 mL/min (ref >60)
Glucose, Bld: 114 mg/dL — ABNORMAL HIGH (ref 70–99)
Potassium: 3.6 mmol/L (ref 3.5–5.1)
Sodium: 137 mmol/L (ref 135–145)
Total Bilirubin: 1.1 mg/dL (ref 0.3–1.2)
Total Protein: 6.7 g/dL (ref 6.5–8.1)

## 2018-10-10 LAB — CBC WITH DIFFERENTIAL/PLATELET
Abs Immature Granulocytes: 0.07 10*3/uL (ref 0.00–0.07)
Basophils Absolute: 0.2 10*3/uL — ABNORMAL HIGH (ref 0.0–0.1)
Basophils Relative: 1 %
Eosinophils Absolute: 0.3 10*3/uL (ref 0.0–0.5)
Eosinophils Relative: 3 %
HCT: 45.5 % (ref 36.0–46.0)
Hemoglobin: 15.5 g/dL — ABNORMAL HIGH (ref 12.0–15.0)
Immature Granulocytes: 1 %
Lymphocytes Relative: 7 %
Lymphs Abs: 0.7 10*3/uL (ref 0.7–4.0)
MCH: 30.7 pg (ref 26.0–34.0)
MCHC: 34.1 g/dL (ref 30.0–36.0)
MCV: 90.1 fL (ref 80.0–100.0)
Monocytes Absolute: 0.9 10*3/uL (ref 0.1–1.0)
Monocytes Relative: 9 %
Neutro Abs: 8.5 10*3/uL — ABNORMAL HIGH (ref 1.7–7.7)
Neutrophils Relative %: 79 %
Platelets: 461 10*3/uL — ABNORMAL HIGH (ref 150–400)
RBC: 5.05 MIL/uL (ref 3.87–5.11)
RDW: 15.2 % (ref 11.5–15.5)
WBC: 10.7 10*3/uL — ABNORMAL HIGH (ref 4.0–10.5)
nRBC: 0 % (ref 0.0–0.2)

## 2018-10-10 MED ORDER — ONDANSETRON HCL 8 MG PO TABS: 8.0000 mg | ORAL_TABLET | Freq: Three times a day (TID) | ORAL | 1 refills | Status: DC | PRN

## 2018-10-10 MED ORDER — HYDROXYUREA 500 MG PO CAPS: 500.0000 mg | ORAL_CAPSULE | Freq: Every day | ORAL | 0 refills | Status: DC

## 2018-10-10 NOTE — Progress Notes (Signed)
Patient here today for follow up regarding polycythemia. Patient reports headaches and diarrhea, states it seems to be related to Hydrea.

## 2018-10-10 NOTE — Progress Notes (Signed)
[facility] day:  10/10/2018  Chief Complaint: Angelica Juarez is a 77 y.o. 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  HPI:  The patient is was last seen in the hematology clinic on 09/12/2018.  At that time, patient was doing well overall. She complained of being more short of breath. (+) orthopnea at night. Recent ONO demonstrated need for supplemental oxygen to be bled in with her current nocturnal PAP therapy. Exam revealed left upper extremity bruising.  WBC was 15,100 (ANC 12,700).  Hemoglobin was 14.7, hematocrit 43.4, and platelets 537,000.  At last visit, bone marrow was reviewed.  She began hydroxyurea 500 mg a day.  Labs on 09/25/2018 revealed a hematocrit of 44.2, hemoglobin 14.7, MCV 89.3, platelets 378,000, WBC 11,900 with an ANC of 9800.  She underwent a small volume phlebotomy (250 cc).  During the interim, she has had some nausea and headache.  She has used Pepto-Bismol.  She notes diarrhea (10 x).  She has not felt good x 1 week.  She is drinking some fluids.  She is "living on chicken noodle soup".  She denies any fevers.  She denies any sick exposures.   Past Medical History:  Diagnosis Date  . Allergy    Seasonal  . Arthritis   . 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 Chronic kidney disease    chronic renal insufficiency  . Colon polyps 05/21/14  . COPD (chronic obstructive pulmonary disease) ([place])   . Diabetes mellitus without complication ([place])   . Dyspnea   . Fracture closed, humerus, shaft    right   . History of kidney stones    40 years ago  . Hyperlipidemia 04/30/14  . Hypertension   . Impingement syndrome of right shoulder 12/29/15  . Personal history of radiation therapy   . Pneumonia    hx  . Rotator cuff tear 04/21/16   right  . Seizures ([place])   . Sleep apnea     Past Surgical History:  Procedure Laterality Date  . ABDOMINAL HYSTERECTOMY    .  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 EYE SURGERY     bilateral cataracts  . JOINT REPLACEMENT     bilateral knee replacement  . NASAL SEPTUM SURGERY    . REPLACEMENT TOTAL KNEE Bilateral 2004  . SHOULDER ARTHROSCOPY WITH BICEPSTENOTOMY Right 05/25/2016   Procedure: SHOULDER ARTHROSCOPY WITH BICEPSTENOTOMY;  Surgeon: Leanor Kail, MD;  Location: [facility] ORS;  Service: Orthopedics;  Laterality: Right;  . SHOULDER ARTHROSCOPY WITH DISTAL CLAVICLE RESECTION Right 05/25/2016   Procedure: SHOULDER ARTHROSCOPY WITH DISTAL CLAVICLE RESECTION;  Surgeon: Leanor Kail, MD;  Location: [facility] ORS;  Service: Orthopedics;  Laterality: Right;  . SHOULDER ARTHROSCOPY WITH OPEN ROTATOR CUFF REPAIR Right 05/25/2016   Procedure: SHOULDER ARTHROSCOPY WITH OPEN ROTATOR CUFF REPAIR;  Surgeon: Leanor Kail, MD;  Location: [facility] ORS;  Service: Orthopedics;  Laterality: Right;  . SUBACROMIAL DECOMPRESSION Right 05/25/2016   Procedure: SUBACROMIAL DECOMPRESSION;  Surgeon: Leanor Kail, MD;  Location: [facility] ORS;  Service: Orthopedics;  Laterality: Right;    Family History  Problem Relation Age of Onset  . Breast cancer Paternal Aunt 49  . Diabetes Father   . Heart disease Father     Social History:  reports that she quit smoking about 13 years ago. She has a 40.00 pack-year smoking history. She has never used smokeless tobacco.  She reports that she does not drink alcohol or use drugs.  Patient moved to [place] in 2001 from [place]. Patient is a former 1 ppd smoker x 40 years; quit in 2006. She is retired from [employer]. Patient denies known exposures to radiation on toxins. The patient's significant other is Elpidio Eric.  She is alone today.  Allergies:  Allergies  Allergen Reactions  . Iodinated Diagnostic Agents Anaphylaxis    Other reaction(s): Other (See Comments) Throat swells and extreme hives  . Latex Itching  . Phenobarbital Hives  . Tape Rash    silicones    Current  Medications: Current Outpatient Medications  Medication Sig Dispense Refill  . albuterol (ACCUNEB) 1.25 MG/3ML nebulizer solution Take 1 ampule by nebulization every 6 (six) hours as needed. wheezing  3  . albuterol (ACCUNEB) 1.25 MG/3ML nebulizer solution USE 1 VIAL VIA NEBULIZER EVERY 6 HOURS AS NEEDED FOR WHEEZING    . albuterol (PROVENTIL HFA;VENTOLIN HFA) 108 (90 Base) MCG/ACT inhaler Inhale 2 puffs into the lungs every 6 (six) hours as needed. 3 Inhaler 2  . aspirin 81 MG tablet Take 81 mg by mouth daily.    Marland Kitchen atorvastatin (LIPITOR) 40 MG tablet Take 40 mg by mouth daily.    . B Complex Vitamins (VITAMIN B COMPLEX PO) Take 1 tablet by mouth daily.    . Calcium Carbonate-Vitamin D 600-200 MG-UNIT CAPS Take 1 capsule by mouth daily.    . cloNIDine (CATAPRES) 0.2 MG tablet Take 0.2 mg by mouth 2 (two) times daily.    . diphenhydrAMINE (BENADRYL) 25 mg capsule Take 25 mg by mouth daily.    Marland Kitchen EPINEPHrine (EPIPEN 2-PAK) 0.3 mg/0.3 mL IJ SOAJ injection 0.3 mg once. Reported on 05/25/2016    . fluticasone (FLONASE) 50 MCG/ACT nasal spray Place 2 sprays into both nostrils daily. 16 g 2  . Fluticasone-Umeclidin-Vilant (TRELEGY ELLIPTA IN) Inhale 1 puff into the lungs daily.    . Fluticasone-Umeclidin-Vilant (TRELEGY ELLIPTA) 100-62.5-25 MCG/INH AEPB Inhale 1 puff into the lungs daily. 1 each 4  . furosemide (LASIX) 20 MG tablet Take 1 tablet (20 mg total) by mouth 2 (two) times daily. 60 tablet 0  . glucose blood (ONE TOUCH ULTRA TEST) test strip USE ONE STRIP TO CHECK GLUCOSE ONCE DAILY    . hydrALAZINE (APRESOLINE) 100 MG tablet Take 50 mg by mouth 3 (three) times daily.     . hydroxyurea (HYDREA) 500 MG capsule Take 1 capsule (500 mg total) by mouth daily. May take with food to minimize GI side effects. 30 capsule 0  . levETIRAcetam (KEPPRA) 750 MG tablet Take 750 mg by mouth 2 (two) times daily.    . metFORMIN (GLUCOPHAGE) 500 MG tablet Take 500 mg by mouth 2 (two) times daily with a meal.    .  metoprolol succinate (TOPROL-XL) 100 MG 24 hr tablet Take 100 mg by mouth daily.    . montelukast (SINGULAIR) 10 MG tablet TAKE 1 TABLET BY MOUTH EVERY DAY 90 tablet 0  . Multiple Vitamin (MULTI-VITAMINS) TABS Take 1 tablet by mouth daily.    . Multiple Vitamins-Minerals (MULTIVITAMIN WITH MINERALS) tablet Take 1 tablet by mouth daily.    . Multiple Vitamins-Minerals (PRESERVISION AREDS 2+MULTI VIT PO) Take by mouth.    Marland Kitchen NIFEdipine (NIFEDICAL XL) 30 MG 24 hr tablet Take 30 mg by mouth daily.    Marland Kitchen OVER THE COUNTER MEDICATION Place 1 drop into both eyes daily. Allergy eye drops    . sitaGLIPtin (JANUVIA) 25 MG tablet  Take 1 tablet by mouth daily.    . traZODone (DESYREL) 50 MG tablet Take 50-100 mg by mouth at bedtime as needed. Sleep  0  . valsartan-hydrochlorothiazide (DIOVAN-HCT) 320-25 MG tablet Take 1 tablet by mouth daily.  0  . vitamin C (ASCORBIC ACID) 500 MG tablet Take 500 mg by mouth 2 (two) times daily.     No current facility-administered medications for this visit.     Review of Systems  Constitutional: Positive for weight loss (11 pounds). Negative for chills, diaphoresis, fever and malaise/fatigue.       Sick x 1 week.  HENT: Negative.  Negative for congestion, ear discharge, ear pain, nosebleeds and sinus pain.   Eyes: Negative.  Negative for pain and discharge.       Macular degeneration s/p LEFT eye injection on 08/06/2018.  Respiratory: Positive for shortness of breath (exertional). Negative for cough, hemoptysis and sputum production.        COPD.  Sleep apnea.   Cardiovascular: Positive for orthopnea. Negative for chest pain, palpitations, leg swelling and PND.  Gastrointestinal: Positive for diarrhea and nausea. Negative for abdominal pain, blood in stool, constipation, melena and vomiting.  Genitourinary: Negative for dysuria, frequency, hematuria and urgency.  Musculoskeletal: Positive for back pain and joint pain (osteoarthritis; degenerative disck disease).  Negative for falls and myalgias.  Skin: Negative.  Negative for itching and rash.  Neurological: Negative.  Negative for dizziness, tremors, sensory change, speech change, focal weakness, weakness and headaches.  Endo/Heme/Allergies: Positive for environmental allergies (seasonal). Does not bruise/bleed easily.  Psychiatric/Behavioral: Negative for depression, memory loss and suicidal ideas. The patient does not have insomnia.   All other systems reviewed and are negative.  Performance status (ECOG): 0  Vital Signs: BP 105/69   Pulse 76   Temp 97.9 F (36.6 C) (Tympanic)   Resp 18   Wt 220 lb (99.8 kg)   BMI 34.46 kg/m   Physical Exam  Constitutional: She is oriented to person, place, and time and well-developed, well-nourished, and in no distress. No distress.  HENT:  Head: Normocephalic and atraumatic.  Mouth/Throat: Oropharynx is clear and moist and mucous membranes are normal. She has dentures. No oropharyngeal exudate.  Short gray hair.  Eyes: Pupils are equal, round, and reactive to light. Conjunctivae and EOM are normal. No scleral icterus.  Glasses.  Brown eyes.  Neck: Normal range of motion. Neck supple. No JVD present.  Cardiovascular: Normal rate, regular rhythm, normal heart sounds and intact distal pulses. Exam reveals no gallop and no friction rub.  No murmur heard. Pulmonary/Chest: Effort normal and breath sounds normal. No respiratory distress. She has no wheezes. She has no rales.  Abdominal: Soft. Bowel sounds are normal. She exhibits no distension and no mass. There is no abdominal tenderness. There is no rebound and no guarding.  Musculoskeletal: Normal range of motion.        General: No tenderness or edema.  Neurological: She is alert and oriented to person, place, and time. Gait normal.  Skin: Skin is warm and dry. No rash noted. She is not diaphoretic. No erythema.  Psychiatric: Mood, affect and judgment normal.  Nursing note and vitals  reviewed.   Appointment on 10/10/2018  Component Date Value Ref Range Status  . WBC 10/10/2018 10.7* 4.0 - 10.5 K/uL Final  . RBC 10/10/2018 5.05  3.87 - 5.11 MIL/uL Final  . Hemoglobin 10/10/2018 15.5* 12.0 - 15.0 g/dL Final  . HCT 10/10/2018 45.5  36.0 - 46.0 % Final  .  MCV 10/10/2018 90.1  80.0 - 100.0 fL Final  . MCH 10/10/2018 30.7  26.0 - 34.0 pg Final  . MCHC 10/10/2018 34.1  30.0 - 36.0 g/dL Final  . RDW 10/10/2018 15.2  11.5 - 15.5 % Final  . Platelets 10/10/2018 461* 150 - 400 K/uL Final  . nRBC 10/10/2018 0.0  0.0 - 0.2 % Final  . Neutrophils Relative % 10/10/2018 79  % Final  . Neutro Abs 10/10/2018 8.5* 1.7 - 7.7 K/uL Final  . Lymphocytes Relative 10/10/2018 7  % Final  . Lymphs Abs 10/10/2018 0.7  0.7 - 4.0 K/uL Final  . Monocytes Relative 10/10/2018 9  % Final  . Monocytes Absolute 10/10/2018 0.9  0.1 - 1.0 K/uL Final  . Eosinophils Relative 10/10/2018 3  % Final  . Eosinophils Absolute 10/10/2018 0.3  0.0 - 0.5 K/uL Final  . Basophils Relative 10/10/2018 1  % Final  . Basophils Absolute 10/10/2018 0.2* 0.0 - 0.1 K/uL Final  . Immature Granulocytes 10/10/2018 1  % Final  . Abs Immature Granulocytes 10/10/2018 0.07  0.00 - 0.07 K/uL Final   Performed at [facility], [address], [place], [place] [address]    Assessment:  Angelica Juarez is a 77 y.o. female with polycythemia rubra vera.  She has had sleep apnea x 7 years and uses CPAP.  She has a 40 pack year smoking history (stopped 13 years ago).  She denies and cardiac history.  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  There was no significant dysplasia or increased blasts.  Overall features were c/w polycythemia rubra vera.  Work up on 08/08/2018 revealed a WBC of 16,700 ([place] 13,800). Hemoglobin 15.3, hematocrit 45.5, MCV 87.9, and platelets 552,000. Erythropoietin level was normal at 2.9 mIU/mL. BCR/ABL demonstrated no BCR or  ABL gene rearrangements. Carbon monoxide level was normal at 3.0% (0-3.6 %).  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   She began a phlebotomy program on 09/07/2018.  Hematocrit goal is <=42.  She began hydroxyurea on 09/12/2018.  Symptomatically, she has had a week of "not feeling good".  She describes nausea and diarrhea.  Exam is normal.  Hematocrit is 45.5.  Platelet 461,000.  WBC 10,700 with an [place] of 8500.  Plan: 1. Labs today:  CBC with diff, CMP. 2. Polycythemia rubra vera (PV) Labs reviewed:  Hematocrit 45.5 and platelets 461,000. She has been on hydroxyurea x 1 month. She was tolerating well until approximately 1 week ago.  Suspect GI bug. Discuss no phlebotomy today secondary to acute illness. Encourage good hydration. Continue hydroxyurea 500 mg a day. 3.   Nausea and Diarrhea  Check orthostatics.  Stool for C diff.  Rx: ondansetron 4.   RTC on 10/15/2018 for CBC and +/- phlebotomy 5.   RTC on 10/24/2018 for MD assessment, labs (CBC with diff) +/- phlebotomy.    Nolon Stalls, MD 10/10/2018,2:53 PM

## 2018-10-15 ENCOUNTER — Inpatient Hospital Stay: Payer: Medicare Other | Attending: Hematology and Oncology

## 2018-10-15 ENCOUNTER — Inpatient Hospital Stay: Payer: Medicare Other

## 2018-10-15 ENCOUNTER — Encounter: Payer: Self-pay | Admitting: Urgent Care

## 2018-10-15 VITALS — BP 120/76 | HR 66 | Temp 94.7°F | Resp 18

## 2018-10-15 DIAGNOSIS — D45 Polycythemia vera: Secondary | ICD-10-CM | POA: Diagnosis not present

## 2018-10-15 DIAGNOSIS — Z79899 Other long term (current) drug therapy: Secondary | ICD-10-CM | POA: Diagnosis not present

## 2018-10-15 DIAGNOSIS — R197 Diarrhea, unspecified: Secondary | ICD-10-CM

## 2018-10-15 DIAGNOSIS — A0472 Enterocolitis due to Clostridium difficile, not specified as recurrent: Secondary | ICD-10-CM | POA: Insufficient documentation

## 2018-10-15 LAB — C DIFFICILE QUICK SCREEN W PCR REFLEX
C Diff antigen: POSITIVE — AB
C Diff interpretation: DETECTED
C Diff toxin: POSITIVE — AB

## 2018-10-15 MED ORDER — VANCOMYCIN HCL 125 MG PO CAPS: 125.0000 mg | ORAL_CAPSULE | Freq: Four times a day (QID) | ORAL | 0 refills | Status: DC

## 2018-10-15 NOTE — Patient Instructions (Signed)

## 2018-10-15 NOTE — Progress Notes (Addendum)
[place]   10/15/18   Re: (+) stool study results     Pertinent results as follows: Lab Results  Component Value Date   CDIFFANTIGEN POSITIVE (A) 10/15/2018   CDIFFINTERP Toxin producing C. difficile detected. 10/15/2018     Plans: 1. Contact patient with results. 2. Seeing PCP/Pulmonology on 10/16/2018 - provider Versie Starks, NP) contacted via secure message to make that practice aware.  3. Educate on strict handwashing with SOAP and WATER only to prevent transmission. Will need to clean toilet and surfaces with bleach.  4. Allergy list assessed. No documented allergy to ABX. Will start 10 day course of oral Vancomycin 125 mg four times a day.  5. Encourage patient to increase fluid intake using ORS (gatorade and pedialyte) to avoid dehydration and electrolyte derangements. Increase intake of K+ rich foods.  6. AVOID antidiarrheals.  7. Contact the clinic if not improving, or if unable to consume adequate amounts of fluids, and we can bring her back in for IVFs  Honor Loh, MSN, APRN, FNP-C, CEN Oncology/Hematology Nurse Practitioner  [place] 10/15/18, 4:14 PM

## 2018-10-16 ENCOUNTER — Encounter: Payer: Self-pay | Admitting: Adult Health

## 2018-10-16 ENCOUNTER — Other Ambulatory Visit: Payer: Self-pay | Admitting: Urgent Care

## 2018-10-16 ENCOUNTER — Ambulatory Visit (INDEPENDENT_AMBULATORY_CARE_PROVIDER_SITE_OTHER): Payer: Medicare Other

## 2018-10-16 VITALS — BP 104/62 | HR 73 | Resp 16 | Ht 67.0 in | Wt 220.0 lb

## 2018-10-16 DIAGNOSIS — J301 Allergic rhinitis due to pollen: Secondary | ICD-10-CM | POA: Diagnosis not present

## 2018-10-16 MED ORDER — VANCOMYCIN HCL 125 MG PO CAPS: 125.0000 mg | ORAL_CAPSULE | Freq: Four times a day (QID) | ORAL | 0 refills | Status: DC

## 2018-10-16 NOTE — Progress Notes (Signed)
Pt came here allergy injection

## 2018-10-24 ENCOUNTER — Inpatient Hospital Stay: Payer: Medicare Other

## 2018-10-24 ENCOUNTER — Inpatient Hospital Stay (HOSPITAL_BASED_OUTPATIENT_CLINIC_OR_DEPARTMENT_OTHER): Payer: Medicare Other | Admitting: Hematology and Oncology

## 2018-10-24 ENCOUNTER — Encounter: Payer: Self-pay | Admitting: Hematology and Oncology

## 2018-10-24 ENCOUNTER — Encounter: Payer: Self-pay | Admitting: *Deleted

## 2018-10-24 VITALS — BP 148/75 | HR 73 | Temp 97.2°F | Resp 18 | Wt 221.3 lb

## 2018-10-24 DIAGNOSIS — A09 Infectious gastroenteritis and colitis, unspecified: Secondary | ICD-10-CM

## 2018-10-24 DIAGNOSIS — D45 Polycythemia vera: Secondary | ICD-10-CM

## 2018-10-24 DIAGNOSIS — A0472 Enterocolitis due to Clostridium difficile, not specified as recurrent: Secondary | ICD-10-CM

## 2018-10-24 DIAGNOSIS — Z79899 Other long term (current) drug therapy: Secondary | ICD-10-CM

## 2018-10-24 LAB — CBC WITH DIFFERENTIAL/PLATELET
Abs Immature Granulocytes: 0.08 10*3/uL — ABNORMAL HIGH (ref 0.00–0.07)
Basophils Absolute: 0.2 10*3/uL — ABNORMAL HIGH (ref 0.0–0.1)
Basophils Relative: 1 %
Eosinophils Absolute: 0.4 10*3/uL (ref 0.0–0.5)
Eosinophils Relative: 3 %
HCT: 44.7 % (ref 36.0–46.0)
Hemoglobin: 15.1 g/dL — ABNORMAL HIGH (ref 12.0–15.0)
Immature Granulocytes: 1 %
Lymphocytes Relative: 6 %
Lymphs Abs: 0.9 10*3/uL (ref 0.7–4.0)
MCH: 30.3 pg (ref 26.0–34.0)
MCHC: 33.8 g/dL (ref 30.0–36.0)
MCV: 89.8 fL (ref 80.0–100.0)
Monocytes Absolute: 1.3 10*3/uL — ABNORMAL HIGH (ref 0.1–1.0)
Monocytes Relative: 9 %
Neutro Abs: 12.6 10*3/uL — ABNORMAL HIGH (ref 1.7–7.7)
Neutrophils Relative %: 80 %
Platelets: 421 10*3/uL — ABNORMAL HIGH (ref 150–400)
RBC: 4.98 MIL/uL (ref 3.87–5.11)
RDW: 15 % (ref 11.5–15.5)
WBC: 15.5 10*3/uL — ABNORMAL HIGH (ref 4.0–10.5)
nRBC: 0 % (ref 0.0–0.2)

## 2018-10-24 NOTE — Progress Notes (Signed)
[facility] day:  10/24/2018  Chief Complaint: Angelica Juarez is a 77 y.o. female with polycythemia rubra vera (PV) who is seen for 2 week assessment on hydroxyurea.  HPI:  The patient is was last seen in the hematology clinic on 10/10/2018.  At that time, she had been on hydroxyurea x 1 month.  CBC revealed a hematocrit of 45.5, hemoglobin 15.5, platelets 461,000, WBC 10,700 with an ANC of 8500.  At last visit, she noted not feeling well for a week.  She described nausea and diarrhea.  She denied any sick exposures.  Stool was sent for C. diff.  C. diff was positive.  She was started on vancomycin 125 mg PO QID.  She was prescribed ondansetron.  On 10/15/2018, she underwent a 250 cc phlebotomy.  During the interim, patient continues to have diarrhea, however notes that it has significantly improved. She remains on the prescribed oral vancomycin. No fevers or any persistent nausea. She makes mention of cold symptoms last week that resolved without treatment.   Patient advises that she maintains an adequate appetite. She is eating well. Weight today is 221 lb 5.5 oz (100.4 kg), which compared to her last visit to the clinic, represents a 1 pound increase.   Patient remains on the prescribed hydroxyurea 500 mg dose.   Patient denies pain in the clinic today.   Past Medical History:  Diagnosis Date  . Allergy    Seasonal  . Arthritis   . 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 Chronic kidney disease    chronic renal insufficiency  . Colon polyps 05/21/14  . COPD (chronic obstructive pulmonary disease) ([place])   . Diabetes mellitus without complication ([place])   . Dyspnea   . Fracture closed, humerus, shaft    right   . History of kidney stones    40 years ago  . Hyperlipidemia 04/30/14  . Hypertension   . Impingement syndrome of right shoulder 12/29/15  . Personal history of radiation therapy   . Pneumonia    hx  . Rotator cuff tear  04/21/16   right  . Seizures ([place])   . Sleep apnea     Past Surgical History:  Procedure Laterality Date  . ABDOMINAL HYSTERECTOMY    . 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 EYE SURGERY     bilateral cataracts  . JOINT REPLACEMENT     bilateral knee replacement  . NASAL SEPTUM SURGERY    . REPLACEMENT TOTAL KNEE Bilateral 2004  . SHOULDER ARTHROSCOPY WITH BICEPSTENOTOMY Right 05/25/2016   Procedure: SHOULDER ARTHROSCOPY WITH BICEPSTENOTOMY;  Surgeon: Leanor Kail, MD;  Location: [facility] ORS;  Service: Orthopedics;  Laterality: Right;  . SHOULDER ARTHROSCOPY WITH DISTAL CLAVICLE RESECTION Right 05/25/2016   Procedure: SHOULDER ARTHROSCOPY WITH DISTAL CLAVICLE RESECTION;  Surgeon: Leanor Kail, MD;  Location: [facility] ORS;  Service: Orthopedics;  Laterality: Right;  . SHOULDER ARTHROSCOPY WITH OPEN ROTATOR CUFF REPAIR Right 05/25/2016   Procedure: SHOULDER ARTHROSCOPY WITH OPEN ROTATOR CUFF REPAIR;  Surgeon: Leanor Kail, MD;  Location: [facility] ORS;  Service: Orthopedics;  Laterality: Right;  . SUBACROMIAL DECOMPRESSION Right 05/25/2016   Procedure: SUBACROMIAL DECOMPRESSION;  Surgeon: Leanor Kail, MD;  Location: [facility] ORS;  Service: Orthopedics;  Laterality: Right;    Family History  Problem Relation Age of Onset  . Breast cancer Paternal Aunt 34  . Diabetes Father   . Heart disease Father  Social History:  reports that she quit smoking about 13 years ago. She has a 40.00 pack-year smoking history. She has never used smokeless tobacco. She reports that she does not drink alcohol or use drugs.  Patient moved to [place] in 2001 from [place]. Patient is a former 1 ppd smoker x 40 years; quit in 2006. She is retired from [employer]. Patient denies known exposures to radiation on toxins. Her significant other is [employer].  She is alone today.  Allergies:  Allergies  Allergen Reactions  . Iodinated Diagnostic Agents Anaphylaxis    Other reaction(s): Other (See  Comments) Throat swells and extreme hives  . Latex Itching  . Phenobarbital Hives  . Tape Rash    silicones    Current Medications: Current Outpatient Medications  Medication Sig Dispense Refill  . albuterol (ACCUNEB) 1.25 MG/3ML nebulizer solution Take 1 ampule by nebulization every 6 (six) hours as needed. wheezing  3  . albuterol (PROVENTIL HFA;VENTOLIN HFA) 108 (90 Base) MCG/ACT inhaler Inhale 2 puffs into the lungs every 6 (six) hours as needed. 3 Inhaler 2  . aspirin 81 MG tablet Take 81 mg by mouth daily.    Marland Kitchen atorvastatin (LIPITOR) 40 MG tablet Take 40 mg by mouth daily.    . B Complex Vitamins (VITAMIN B COMPLEX PO) Take 1 tablet by mouth daily.    . Calcium Carbonate-Vitamin D 600-200 MG-UNIT CAPS Take 1 capsule by mouth daily.    . cloNIDine (CATAPRES) 0.2 MG tablet Take 0.2 mg by mouth 2 (two) times daily.    . diphenhydrAMINE (BENADRYL) 25 mg capsule Take 25 mg by mouth daily.    Marland Kitchen EPINEPHrine (EPIPEN 2-PAK) 0.3 mg/0.3 mL IJ SOAJ injection 0.3 mg once. Reported on 05/25/2016    . fluticasone (FLONASE) 50 MCG/ACT nasal spray Place 2 sprays into both nostrils daily. 16 g 2  . Fluticasone-Umeclidin-Vilant (TRELEGY ELLIPTA) 100-62.5-25 MCG/INH AEPB Inhale 1 puff into the lungs daily. 1 each 4  . furosemide (LASIX) 20 MG tablet Take 1 tablet (20 mg total) by mouth 2 (two) times daily. 60 tablet 0  . glucose blood (ONE TOUCH ULTRA TEST) test strip USE ONE STRIP TO CHECK GLUCOSE ONCE DAILY    . hydrALAZINE (APRESOLINE) 100 MG tablet Take 50 mg by mouth 3 (three) times daily.     . hydroxyurea (HYDREA) 500 MG capsule Take 1 capsule (500 mg total) by mouth daily. May take with food to minimize GI side effects. 90 capsule 0  . levETIRAcetam (KEPPRA) 750 MG tablet Take 750 mg by mouth 2 (two) times daily.    . metFORMIN (GLUCOPHAGE) 500 MG tablet Take 500 mg by mouth 2 (two) times daily with a meal.    . metoprolol succinate (TOPROL-XL) 100 MG 24 hr tablet Take 100 mg by mouth daily.     . montelukast (SINGULAIR) 10 MG tablet TAKE 1 TABLET BY MOUTH EVERY DAY 90 tablet 0  . Multiple Vitamin (MULTI-VITAMINS) TABS Take 1 tablet by mouth daily.    Marland Kitchen NIFEdipine (NIFEDICAL XL) 30 MG 24 hr tablet Take 30 mg by mouth daily.    Marland Kitchen OVER THE COUNTER MEDICATION Place 1 drop into both eyes daily. Allergy eye drops    . sitaGLIPtin (JANUVIA) 25 MG tablet Take 1 tablet by mouth daily.    . traZODone (DESYREL) 50 MG tablet Take 50-100 mg by mouth at bedtime as needed. Sleep  0  . valsartan-hydrochlorothiazide (DIOVAN-HCT) 320-25 MG tablet Take 1 tablet by mouth daily.  0  .  vancomycin (VANCOCIN) 125 MG capsule Take 1 capsule (125 mg total) by mouth 4 (four) times daily. 40 capsule 0  . vitamin C (ASCORBIC ACID) 500 MG tablet Take 500 mg by mouth 2 (two) times daily.    . ondansetron (ZOFRAN) 8 MG tablet Take 1 tablet (8 mg total) by mouth every 8 (eight) hours as needed for nausea or vomiting. (Patient not taking: Reported on 10/24/2018) 20 tablet 1   No current facility-administered medications for this visit.     Review of Systems  Constitutional: Negative for chills, diaphoresis, fever, malaise/fatigue and weight loss (up 1 pound).  HENT: Negative for ear discharge, ear pain, nosebleeds, sinus pain and sore throat.        Head cold.  Eyes: Negative.  Negative for double vision, photophobia, pain and discharge.       Macular degeneration.  Respiratory: Positive for shortness of breath (exertional). Negative for cough, hemoptysis and sputum production.        COPD.  Sleep apnea.   Cardiovascular: Negative for chest pain, palpitations, leg swelling and PND.  Gastrointestinal: Positive for diarrhea. Negative for abdominal pain, blood in stool, constipation, melena, nausea and vomiting.       C diff infection.  "Not much of an appetite".  Genitourinary: Negative.  Negative for dysuria, frequency, hematuria and urgency.  Musculoskeletal: Positive for back pain and joint pain (degnerative  disk; osteoarthritis). Negative for falls, myalgias and neck pain.  Skin: Negative.  Negative for itching and rash.  Neurological: Negative for dizziness, tremors, sensory change, speech change, focal weakness, weakness and headaches.  Endo/Heme/Allergies: Positive for environmental allergies (seasonal). Does not bruise/bleed easily.  Psychiatric/Behavioral: Negative for depression and memory loss. The patient is not nervous/anxious and does not have insomnia.   All other systems reviewed and are negative.  Performance status (ECOG): 1  Vital Signs: BP (!) 148/75 (BP Location: Left Arm, Patient Position: Sitting)   Pulse 73   Temp (!) 97.2 F (36.2 C) (Tympanic)   Resp 18   Wt 221 lb 5.5 oz (100.4 kg)   SpO2 93%   BMI 34.67 kg/m   Physical Exam  Constitutional: She is oriented to person, place, and time and well-developed, well-nourished, and in no distress. No distress.  HENT:  Head: Normocephalic and atraumatic.  Mouth/Throat: Oropharynx is clear and moist and mucous membranes are normal. She has dentures. No oropharyngeal exudate.  Short gray hair.  Herpetic lesion.  Eyes: Pupils are equal, round, and reactive to light. Conjunctivae and EOM are normal. No scleral icterus.  Glasses.  Brown eyes.  Neck: Normal range of motion. Neck supple. No JVD present.  Cardiovascular: Normal rate, regular rhythm, normal heart sounds and intact distal pulses. Exam reveals no gallop and no friction rub.  No murmur heard. Pulmonary/Chest: Effort normal and breath sounds normal. No respiratory distress. She has no wheezes. She has no rales.  Abdominal: Soft. Bowel sounds are normal. She exhibits no distension and no mass. There is no abdominal tenderness. There is no rebound and no guarding.  Musculoskeletal: Normal range of motion.        General: No tenderness or edema.  Lymphadenopathy:    She has no cervical adenopathy.  Neurological: She is alert and oriented to person, place, and time.  Gait normal.  Skin: Skin is warm and dry. No rash noted. She is not diaphoretic. No erythema. No pallor.  Psychiatric: Mood, affect and judgment normal.  Nursing note and vitals reviewed.   Appointment on 10/24/2018  Component Date Value Ref Range Status  . WBC 10/24/2018 15.5* 4.0 - 10.5 K/uL Final  . RBC 10/24/2018 4.98  3.87 - 5.11 MIL/uL Final  . Hemoglobin 10/24/2018 15.1* 12.0 - 15.0 g/dL Final  . HCT 10/24/2018 44.7  36.0 - 46.0 % Final  . MCV 10/24/2018 89.8  80.0 - 100.0 fL Final  . MCH 10/24/2018 30.3  26.0 - 34.0 pg Final  . MCHC 10/24/2018 33.8  30.0 - 36.0 g/dL Final  . RDW 10/24/2018 15.0  11.5 - 15.5 % Final  . Platelets 10/24/2018 421* 150 - 400 K/uL Final  . nRBC 10/24/2018 0.0  0.0 - 0.2 % Final  . Neutrophils Relative % 10/24/2018 80  % Final  . Neutro Abs 10/24/2018 12.6* 1.7 - 7.7 K/uL Final  . Lymphocytes Relative 10/24/2018 6  % Final  . Lymphs Abs 10/24/2018 0.9  0.7 - 4.0 K/uL Final  . Monocytes Relative 10/24/2018 9  % Final  . Monocytes Absolute 10/24/2018 1.3* 0.1 - 1.0 K/uL Final  . Eosinophils Relative 10/24/2018 3  % Final  . Eosinophils Absolute 10/24/2018 0.4  0.0 - 0.5 K/uL Final  . Basophils Relative 10/24/2018 1  % Final  . Basophils Absolute 10/24/2018 0.2* 0.0 - 0.1 K/uL Final  . Immature Granulocytes 10/24/2018 1  % Final  . Abs Immature Granulocytes 10/24/2018 0.08* 0.00 - 0.07 K/uL Final   Performed at [facility], [address], [place], [place] [address]    Assessment:  Angelica Juarez is a 77 y.o. female with polycythemia rubra vera.  She has had sleep apnea x 7 years and uses CPAP.  She has a 40 pack year smoking history (stopped 13 years ago).  She denies and cardiac history.  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  There was no significant dysplasia or increased blasts.  Overall features were c/w polycythemia rubra vera.  Work up on 08/08/2018  revealed a WBC of 16,700 ([place] 13,800). Hemoglobin 15.3, hematocrit 45.5, MCV 87.9, and platelets 552,000. Erythropoietin level was normal at 2.9 mIU/mL. BCR/ABL demonstrated no BCR or ABL gene rearrangements. Carbon monoxide level was normal at 3.0% (0-3.6 %).  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   She began a phlebotomy program on 09/07/2018 (last 10/15/2018).  Hematocrit goal is <=42.  She was diagnosed with C difficile + diarrhea on 10/10/2018.  She is on oral vancomycin.  Symptomatically, her diarrhea has improved.  WBC 15,500 ([place] 12,600).  Hemoglobin 15.1, hematocrit 44.7, and platelets 421,000.  Plan: 1. Labs today:  CBC with diff. 2. Polycythemia rubra vera (PV) Hematocrit 44.7, hemoglobin 15.1.  Hematocrit goal <= 42. Phlebotomy 250 cc today. Platelets 421,000. Goal is < 400,000. Continue hydroxyurea 500 mg daily. No adjustment in dose during acute illness.  3. C difficile + diarrhea  Stool for C diff after completion of antibiotics. 4. RTC in 2 weeks for labs (CBC with diff) +/- phlebotomy. 5.   RTC in 4 weeks for MD assessment and labs (CBC with diff, CMP).    Honor Loh, NP  10/24/2018, 2:52 PM   I saw and evaluated the patient, participating in the key portions of the service and reviewing pertinent diagnostic studies and records.  I reviewed the nurse practitioner's note and agree with the findings and the plan.  The assessment and plan were discussed with the patient.  Several questions were asked by the patient and answered.   Nolon Stalls,  MD 10/24/2018,2:52 PM

## 2018-10-24 NOTE — Progress Notes (Signed)
Pt states she received positive test for CDIFF within past 2 weeks and is currently taking Vanc. Denies any other complaints at this time.

## 2018-10-31 ENCOUNTER — Telehealth: Payer: Self-pay | Admitting: Internal Medicine

## 2018-10-31 NOTE — Telephone Encounter (Signed)
I spoke with patient and with DrSaadat and Orson Gear and once patient gets better from her hematology and GI appts she will call and schedule her sleep study in office and per [facility] it will be ok to call in Tobias to help patient sleep. Beth

## 2018-11-18 DIAGNOSIS — R197 Diarrhea, unspecified: Secondary | ICD-10-CM | POA: Insufficient documentation

## 2018-11-18 DIAGNOSIS — R110 Nausea: Secondary | ICD-10-CM | POA: Insufficient documentation

## 2018-11-21 ENCOUNTER — Inpatient Hospital Stay: Payer: Medicare Other

## 2018-11-21 ENCOUNTER — Inpatient Hospital Stay: Payer: Medicare Other | Attending: Hematology and Oncology | Admitting: Hematology and Oncology

## 2018-11-21 ENCOUNTER — Other Ambulatory Visit: Payer: Self-pay

## 2018-11-21 VITALS — BP 135/69 | HR 80 | Temp 96.1°F | Resp 18 | Wt 219.8 lb

## 2018-11-21 DIAGNOSIS — A0471 Enterocolitis due to Clostridium difficile, recurrent: Secondary | ICD-10-CM | POA: Diagnosis not present

## 2018-11-21 DIAGNOSIS — D45 Polycythemia vera: Secondary | ICD-10-CM | POA: Insufficient documentation

## 2018-11-21 DIAGNOSIS — R42 Dizziness and giddiness: Secondary | ICD-10-CM | POA: Diagnosis not present

## 2018-11-21 DIAGNOSIS — Z79899 Other long term (current) drug therapy: Secondary | ICD-10-CM | POA: Insufficient documentation

## 2018-11-21 DIAGNOSIS — J449 Chronic obstructive pulmonary disease, unspecified: Secondary | ICD-10-CM | POA: Diagnosis not present

## 2018-11-21 DIAGNOSIS — Z87891 Personal history of nicotine dependence: Secondary | ICD-10-CM | POA: Diagnosis not present

## 2018-11-21 DIAGNOSIS — R197 Diarrhea, unspecified: Secondary | ICD-10-CM

## 2018-11-21 DIAGNOSIS — G4730 Sleep apnea, unspecified: Secondary | ICD-10-CM | POA: Diagnosis not present

## 2018-11-21 DIAGNOSIS — Z803 Family history of malignant neoplasm of breast: Secondary | ICD-10-CM | POA: Diagnosis not present

## 2018-11-21 LAB — CBC WITH DIFFERENTIAL/PLATELET
Abs Immature Granulocytes: 0.09 10*3/uL — ABNORMAL HIGH (ref 0.00–0.07)
Basophils Absolute: 0.2 10*3/uL — ABNORMAL HIGH (ref 0.0–0.1)
Basophils Relative: 1 %
Eosinophils Absolute: 0.3 10*3/uL (ref 0.0–0.5)
Eosinophils Relative: 2 %
HCT: 45.5 % (ref 36.0–46.0)
Hemoglobin: 15.7 g/dL — ABNORMAL HIGH (ref 12.0–15.0)
Immature Granulocytes: 1 %
Lymphocytes Relative: 3 %
Lymphs Abs: 0.6 10*3/uL — ABNORMAL LOW (ref 0.7–4.0)
MCH: 31.5 pg (ref 26.0–34.0)
MCHC: 34.5 g/dL (ref 30.0–36.0)
MCV: 91.2 fL (ref 80.0–100.0)
Monocytes Absolute: 1 10*3/uL (ref 0.1–1.0)
Monocytes Relative: 6 %
Neutro Abs: 14 10*3/uL — ABNORMAL HIGH (ref 1.7–7.7)
Neutrophils Relative %: 87 %
Platelets: 389 10*3/uL (ref 150–400)
RBC: 4.99 MIL/uL (ref 3.87–5.11)
RDW: 15.3 % (ref 11.5–15.5)
WBC: 16.1 10*3/uL — ABNORMAL HIGH (ref 4.0–10.5)
nRBC: 0 % (ref 0.0–0.2)

## 2018-11-21 LAB — COMPREHENSIVE METABOLIC PANEL
ALT: 31 U/L (ref 0–44)
AST: 32 U/L (ref 15–41)
Albumin: 4.1 g/dL (ref 3.5–5.0)
Alkaline Phosphatase: 77 U/L (ref 38–126)
Anion gap: 10 (ref 5–15)
BUN: 27 mg/dL — ABNORMAL HIGH (ref 8–23)
CO2: 28 mmol/L (ref 22–32)
Calcium: 10 mg/dL (ref 8.9–10.3)
Chloride: 100 mmol/L (ref 98–111)
Creatinine, Ser: 1.06 mg/dL — ABNORMAL HIGH (ref 0.44–1.00)
GFR calc Af Amer: 59 mL/min — ABNORMAL LOW (ref >60)
GFR calc non Af Amer: 51 mL/min — ABNORMAL LOW (ref >60)
Glucose, Bld: 129 mg/dL — ABNORMAL HIGH (ref 70–99)
Potassium: 3.8 mmol/L (ref 3.5–5.1)
Sodium: 138 mmol/L (ref 135–145)
Total Bilirubin: 0.8 mg/dL (ref 0.3–1.2)
Total Protein: 6.7 g/dL (ref 6.5–8.1)

## 2018-11-21 NOTE — Progress Notes (Signed)
Here for follow up. Stated that she was treated for C DIFF late November. Stated she finished medication regime and over the past 2 days she has been having more frequent BM ,LLQ abd " crampy "pain,and today since midnight has had 6 loose watery BM ,mild foul odor.  Concerned that it ( C diff ) may be returning.

## 2018-11-21 NOTE — Patient Instructions (Signed)

## 2018-11-21 NOTE — Progress Notes (Signed)
[facility] day:  11/21/2018  Chief Complaint: Angelica Juarez is a 78 y.o. 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  HPI:  The patient is was last seen in the hematology clinic on 10/24/2018.  At that time, she was doing well.  Diarrhea secondary to C diff was improving on oral vancomycin.  CBC revealed a hematocrit of 44.7, hemoglobin 15.1, platelets 421,000, WBC 15,500 with an ANC of 12,600.  She underwent phlebotomy of 250 cc.  She continued hydroxyurea 500 mg a day.  During the interim, patient has been having diarrhea. She has had in excess of 5 watery stools today. Patient complains of diffuse abdominal cramping. She is still able to eat and drink normally. Of note, patient completed a 10 day course of oral vancomycin on 10/28/2018 for lab confirmed C.diff. Patient denies any gross blood or mucous in her stools. She denies any fevers. Patient has mild vertigo symptoms in the clinic today.   Patient denies any chest pain, shortness of breath, or palpitations. She continues on the prescribed hydroxyurea 500 mg daily.   Patient advises that she maintains an adequate appetite. She is eating well. Weight today is 219 lb 12.8 oz (99.7 kg), which compared to her last visit to the clinic, represents a 2 pound increase.   Patient complains of pain rated 7/10 in the clinic today.   Past Medical History:  Diagnosis Date  . Allergy    Seasonal  . Arthritis   . 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 Chronic kidney disease    chronic renal insufficiency  . Colon polyps 05/21/14  . COPD (chronic obstructive pulmonary disease) ([place])   . Diabetes mellitus without complication ([place])   . Dyspnea   . Fracture closed, humerus, shaft    right   . History of kidney stones    40 years ago  . Hyperlipidemia 04/30/14  . Hypertension   . Impingement syndrome of right shoulder 12/29/15  . Personal history  of radiation therapy   . Pneumonia    hx  . Rotator cuff tear 04/21/16   right  . Seizures ([place])   . Sleep apnea     Past Surgical History:  Procedure Laterality Date  . ABDOMINAL HYSTERECTOMY    . 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 EYE SURGERY     bilateral cataracts  . JOINT REPLACEMENT     bilateral knee replacement  . NASAL SEPTUM SURGERY    . REPLACEMENT TOTAL KNEE Bilateral 2004  . SHOULDER ARTHROSCOPY WITH BICEPSTENOTOMY Right 05/25/2016   Procedure: SHOULDER ARTHROSCOPY WITH BICEPSTENOTOMY;  Surgeon: Leanor Kail, MD;  Location: [facility] ORS;  Service: Orthopedics;  Laterality: Right;  . SHOULDER ARTHROSCOPY WITH DISTAL CLAVICLE RESECTION Right 05/25/2016   Procedure: SHOULDER ARTHROSCOPY WITH DISTAL CLAVICLE RESECTION;  Surgeon: Leanor Kail, MD;  Location: [facility] ORS;  Service: Orthopedics;  Laterality: Right;  . SHOULDER ARTHROSCOPY WITH OPEN ROTATOR CUFF REPAIR Right 05/25/2016   Procedure: SHOULDER ARTHROSCOPY WITH OPEN ROTATOR CUFF REPAIR;  Surgeon: Leanor Kail, MD;  Location: [facility] ORS;  Service: Orthopedics;  Laterality: Right;  . SUBACROMIAL DECOMPRESSION Right 05/25/2016   Procedure: SUBACROMIAL DECOMPRESSION;  Surgeon: Leanor Kail, MD;  Location: [facility] ORS;  Service: Orthopedics;  Laterality: Right;    Family History  Problem Relation Age of Onset  . Breast cancer Paternal Aunt 36  . Diabetes Father   .  Heart disease Father     Social History:  reports that she quit smoking about 13 years ago. She has a 40.00 pack-year smoking history. She has never used smokeless tobacco. She reports that she does not drink alcohol or use drugs.  Patient moved to [place] in 2001 from [place]. Patient is a former 1 ppd smoker x 40 years; quit in 2006. She is retired from [employer]. Patient denies known exposures to radiation on toxins. The patient is accompanied by her significant other, Elpidio Eric, today.  Allergies:  Allergies  Allergen Reactions  .  Iodinated Diagnostic Agents Anaphylaxis    Other reaction(s): Other (See Comments) Throat swells and extreme hives  . Latex Itching  . Phenobarbital Hives  . Tape Rash    silicones    Current Medications: Current Outpatient Medications  Medication Sig Dispense Refill  . aspirin 81 MG tablet Take 81 mg by mouth daily.    Marland Kitchen atorvastatin (LIPITOR) 40 MG tablet Take 40 mg by mouth daily.    . B Complex Vitamins (VITAMIN B COMPLEX PO) Take 1 tablet by mouth daily.    . Calcium Carbonate-Vitamin D 600-200 MG-UNIT CAPS Take 1 capsule by mouth daily.    . cloNIDine (CATAPRES) 0.2 MG tablet Take 0.2 mg by mouth 2 (two) times daily.    . diphenhydrAMINE (BENADRYL) 25 mg capsule Take 25 mg by mouth daily.    . fluticasone (FLONASE) 50 MCG/ACT nasal spray Place 2 sprays into both nostrils daily. 16 g 2  . Fluticasone-Umeclidin-Vilant (TRELEGY ELLIPTA) 100-62.5-25 MCG/INH AEPB Inhale 1 puff into the lungs daily. 1 each 4  . furosemide (LASIX) 20 MG tablet Take 1 tablet (20 mg total) by mouth 2 (two) times daily. 60 tablet 0  . glucose blood (ONE TOUCH ULTRA TEST) test strip USE ONE STRIP TO CHECK GLUCOSE ONCE DAILY    . hydrALAZINE (APRESOLINE) 100 MG tablet Take 50 mg by mouth 3 (three) times daily.     . hydroxyurea (HYDREA) 500 MG capsule Take 1 capsule (500 mg total) by mouth daily. May take with food to minimize GI side effects. 90 capsule 0  . levETIRAcetam (KEPPRA) 750 MG tablet Take 750 mg by mouth 2 (two) times daily.    . metFORMIN (GLUCOPHAGE) 500 MG tablet Take 500 mg by mouth 2 (two) times daily with a meal.    . metoprolol succinate (TOPROL-XL) 100 MG 24 hr tablet Take 100 mg by mouth daily.    . montelukast (SINGULAIR) 10 MG tablet TAKE 1 TABLET BY MOUTH EVERY DAY 90 tablet 0  . Multiple Vitamin (MULTI-VITAMINS) TABS Take 1 tablet by mouth daily.    Marland Kitchen NIFEdipine (NIFEDICAL XL) 30 MG 24 hr tablet Take 30 mg by mouth daily.    Marland Kitchen OVER THE COUNTER MEDICATION Place 1 drop into both eyes  daily. Allergy eye drops    . sitaGLIPtin (JANUVIA) 25 MG tablet Take 1 tablet by mouth daily.    . traZODone (DESYREL) 50 MG tablet Take 50-100 mg by mouth at bedtime as needed. Sleep  0  . valsartan-hydrochlorothiazide (DIOVAN-HCT) 320-25 MG tablet Take 1 tablet by mouth daily.  0  . vitamin C (ASCORBIC ACID) 500 MG tablet Take 500 mg by mouth 2 (two) times daily.    Marland Kitchen albuterol (ACCUNEB) 1.25 MG/3ML nebulizer solution Take 1 ampule by nebulization every 6 (six) hours as needed. wheezing  3  . albuterol (PROVENTIL HFA;VENTOLIN HFA) 108 (90 Base) MCG/ACT inhaler Inhale 2 puffs into the lungs every 6 (  six) hours as needed. (Patient not taking: Reported on 11/21/2018) 3 Inhaler 2  . EPINEPHrine (EPIPEN 2-PAK) 0.3 mg/0.3 mL IJ SOAJ injection 0.3 mg once. Reported on 05/25/2016    . ondansetron (ZOFRAN) 8 MG tablet Take 1 tablet (8 mg total) by mouth every 8 (eight) hours as needed for nausea or vomiting. (Patient not taking: Reported on 11/21/2018) 20 tablet 1   No current facility-administered medications for this visit.     Review of Systems  Constitutional: Negative.  Negative for chills, diaphoresis, fever, malaise/fatigue and weight loss (up 2 pounds).  HENT: Negative for congestion, ear discharge, ear pain, nosebleeds, sinus pain and sore throat.   Eyes: Negative.  Negative for double vision, photophobia, pain and discharge.       Macular degeneration.  Respiratory: Positive for shortness of breath (exertional). Negative for cough, hemoptysis and sputum production.        COPD.  Sleep apnea.   Cardiovascular: Negative for chest pain, palpitations, orthopnea, leg swelling and PND.  Gastrointestinal: Positive for diarrhea. Negative for abdominal pain (crampy yesterday), blood in stool, constipation, melena, nausea and vomiting.       Bowel movements initially "calmed down" then increased.  Genitourinary: Negative for dysuria, frequency, hematuria and urgency.  Musculoskeletal: Positive for back  pain and joint pain (DDD; osteoarthritis). Negative for falls and myalgias.  Skin: Negative.  Negative for itching and rash.  Neurological: Negative.  Negative for dizziness, tremors, sensory change, speech change, focal weakness, weakness and headaches.  Endo/Heme/Allergies: Negative for environmental allergies (seasonal). Does not bruise/bleed easily.  Psychiatric/Behavioral: Negative.  Negative for depression and memory loss. The patient is not nervous/anxious and does not have insomnia.   All other systems reviewed and are negative.  Performance status (ECOG): 1  Vital Signs: BP 135/69 (BP Location: Left Arm, Patient Position: Sitting)   Pulse 80   Temp (!) 96.1 F (35.6 C) (Tympanic)   Resp 18   Wt 219 lb 12.8 oz (99.7 kg)   BMI 34.43 kg/m   Physical Exam  Constitutional: She is oriented to person, place, and time and well-developed, well-nourished, and in no distress. No distress.  HENT:  Head: Normocephalic and atraumatic.  Mouth/Throat: Oropharynx is clear and moist and mucous membranes are normal. She has dentures. No oropharyngeal exudate.  Short gray hair.  Eyes: Pupils are equal, round, and reactive to light. Conjunctivae and EOM are normal. No scleral icterus.  Glasses.  Brown eyes.  Neck: Normal range of motion. Neck supple. No JVD present.  Cardiovascular: Normal rate, regular rhythm, normal heart sounds and intact distal pulses. Exam reveals no gallop and no friction rub.  No murmur heard. Pulmonary/Chest: Effort normal and breath sounds normal. No respiratory distress. She has no wheezes. She has no rales.  Abdominal: Soft. Bowel sounds are normal. She exhibits no distension and no mass. There is no abdominal tenderness. There is no rebound and no guarding.  Musculoskeletal: Normal range of motion.        General: No tenderness or edema.  Lymphadenopathy:    She has no cervical adenopathy.  Neurological: She is alert and oriented to person, place, and time.   Skin: Skin is warm and dry. No rash noted. She is not diaphoretic. No erythema. No pallor.  Psychiatric: Mood, affect and judgment normal.  Nursing note and vitals reviewed.   Appointment on 11/21/2018  Component Date Value Ref Range Status  . Sodium 11/21/2018 138  135 - 145 mmol/L Final  . Potassium 11/21/2018 3.8  3.5 - 5.1 mmol/L Final  . Chloride 11/21/2018 100  98 - 111 mmol/L Final  . CO2 11/21/2018 28  22 - 32 mmol/L Final  . Glucose, Bld 11/21/2018 129* 70 - 99 mg/dL Final  . BUN 11/21/2018 27* 8 - 23 mg/dL Final  . Creatinine, Ser 11/21/2018 1.06* 0.44 - 1.00 mg/dL Final  . Calcium 11/21/2018 10.0  8.9 - 10.3 mg/dL Final  . Total Protein 11/21/2018 6.7  6.5 - 8.1 g/dL Final  . Albumin 11/21/2018 4.1  3.5 - 5.0 g/dL Final  . AST 11/21/2018 32  15 - 41 U/L Final  . ALT 11/21/2018 31  0 - 44 U/L Final  . Alkaline Phosphatase 11/21/2018 77  38 - 126 U/L Final  . Total Bilirubin 11/21/2018 0.8  0.3 - 1.2 mg/dL Final  . GFR calc non Af Amer 11/21/2018 51* >60 mL/min Final  . GFR calc Af Amer 11/21/2018 59* >60 mL/min Final  . Anion gap 11/21/2018 10  5 - 15 Final   Performed at [facility], [address] [place], [place] [address]  . WBC 11/21/2018 16.1* 4.0 - 10.5 K/uL Final  . RBC 11/21/2018 4.99  3.87 - 5.11 MIL/uL Final  . Hemoglobin 11/21/2018 15.7* 12.0 - 15.0 g/dL Final  . HCT 11/21/2018 45.5  36.0 - 46.0 % Final  . MCV 11/21/2018 91.2  80.0 - 100.0 fL Final  . MCH 11/21/2018 31.5  26.0 - 34.0 pg Final  . MCHC 11/21/2018 34.5  30.0 - 36.0 g/dL Final  . RDW 11/21/2018 15.3  11.5 - 15.5 % Final  . Platelets 11/21/2018 389  150 - 400 K/uL Final  . nRBC 11/21/2018 0.0  0.0 - 0.2 % Final  . Neutrophils Relative % 11/21/2018 87  % Final  . Neutro Abs 11/21/2018 14.0* 1.7 - 7.7 K/uL Final  . Lymphocytes Relative 11/21/2018 3  % Final  . Lymphs Abs 11/21/2018 0.6* 0.7 - 4.0 K/uL Final  . Monocytes Relative 11/21/2018 6  % Final  . Monocytes Absolute  11/21/2018 1.0  0.1 - 1.0 K/uL Final  . Eosinophils Relative 11/21/2018 2  % Final  . Eosinophils Absolute 11/21/2018 0.3  0.0 - 0.5 K/uL Final  . Basophils Relative 11/21/2018 1  % Final  . Basophils Absolute 11/21/2018 0.2* 0.0 - 0.1 K/uL Final  . Immature Granulocytes 11/21/2018 1  % Final  . Abs Immature Granulocytes 11/21/2018 0.09* 0.00 - 0.07 K/uL Final   Performed at [facility], [address] [place], [place] [address]    Assessment:  Angelica Juarez is a 78 y.o. female with polycythemia rubra vera.  She has had sleep apnea x 7 years and uses CPAP.  She has a 40 pack year smoking history (stopped 13 years ago).  She denies and cardiac history.  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  There was no significant dysplasia or increased blasts.  Overall features were c/w polycythemia rubra vera.  Work up on 08/08/2018 revealed a WBC of 16,700 ([place] 13,800). Hemoglobin 15.3, hematocrit 45.5, MCV 87.9, and platelets 552,000. Erythropoietin level was normal at 2.9 mIU/mL. BCR/ABL demonstrated no BCR or ABL gene rearrangements. Carbon monoxide level was normal at 3.0% (0-3.6 %).  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   She began a phlebotomy program on 09/07/2018 (last 10/24/2018).  Hematocrit goal is <=42.  She was diagnosed with C difficile + diarrhea on 10/10/2018.  She  was prescribed oral vancomycin.  Symptomatically, she has recurrent diarrhea.  Exam is unremarkable.  Plan: 1. Labs today:  CBC with diff, CMP. 2. Polycythemia rubra vera (PV) Hematocrit 45.5.  Hemoglobin 15.7.  Platelets 389,000. Goal hematocrit < 42 and platelets 400,000. Patient on hydroxyurea 500 mg daily.  She is tolerating it well.   Continue phlebotomy ever 2 weeks to achieve hematocrit goal. 3. Diarrhea  Concern for recurrent C diff.  Stool for C diff and GI panel by PCR. 4.   Phlebotomy today. 5.   RTC every 2 weeks for  labs (CBC with diff) and +/- phlebotomy. 6.   RTC in 4 weeks for MD assessment and labs (CBC with diff, CMP).   Addendum:  Stool on 11/23/2018 for C diff toxin by PCR was +.  Patient's insurance unable to cover fidaxomicin and co-pay high.  Patient prescribed extended course of vancomycin.   Honor Loh, NP  11/21/2018, 2:50 PM   I saw and evaluated the patient, participating in the key portions of the service and reviewing pertinent diagnostic studies and records.  I reviewed the nurse practitioner's note and agree with the findings and the plan.  The assessment and plan were discussed with the patient.  Several questions were asked by the patient and answered.   Nolon Stalls, MD 11/21/2018,2:50 PM

## 2018-11-22 ENCOUNTER — Ambulatory Visit: Payer: Self-pay | Admitting: Internal Medicine

## 2018-11-23 ENCOUNTER — Inpatient Hospital Stay: Payer: Medicare Other

## 2018-11-23 DIAGNOSIS — D45 Polycythemia vera: Secondary | ICD-10-CM | POA: Diagnosis not present

## 2018-11-23 DIAGNOSIS — R197 Diarrhea, unspecified: Secondary | ICD-10-CM

## 2018-11-23 LAB — GASTROINTESTINAL PANEL BY PCR, STOOL (REPLACES STOOL CULTURE)

## 2018-11-23 LAB — CLOSTRIDIUM DIFFICILE BY PCR, REFLEXED: Toxigenic C. Difficile by PCR: POSITIVE — AB

## 2018-11-23 LAB — C DIFFICILE QUICK SCREEN W PCR REFLEX
C Diff antigen: POSITIVE — AB
C Diff toxin: NEGATIVE

## 2018-11-24 ENCOUNTER — Encounter: Payer: Self-pay | Admitting: Urgent Care

## 2018-11-24 ENCOUNTER — Telehealth: Payer: Self-pay | Admitting: Urgent Care

## 2018-11-24 DIAGNOSIS — A0472 Enterocolitis due to Clostridium difficile, not specified as recurrent: Secondary | ICD-10-CM | POA: Insufficient documentation

## 2018-11-24 MED ORDER — VANCOMYCIN HCL 125 MG PO CAPS: ORAL_CAPSULE | ORAL | 0 refills | Status: AC

## 2018-11-24 NOTE — Telephone Encounter (Addendum)
Angelica Juarez   11/24/18   Re: (+) stool study results   HPI: Patient with CDI back in December 2019. She was treated with a 10 day course of oral vancomycin, which resolved her CDAD. Course of antibiotics was completed on/around 10/28/2018. Patient began to have frequent watery diarrhea approximate 5 days ago with diffuse abdominal cramping. Stools with no obvious mucus or blood.    Pertinent results as follows: Lab Results  Component Value Date   CDIFFANTIGEN POSITIVE (A) 11/23/2018   CDIFFINTERP Results are indeterminate. See PCR results. 11/23/2018   CDIFFPCR POSITIVE (A) 11/23/2018    Lab Results  Component Value Date   WBC 16.1 (H) 11/21/2018   NA 138 11/21/2018   CL 100 11/21/2018   CO2 28 11/21/2018   K 3.8 11/21/2018   BUN 27 (H) 11/21/2018   CREATININE 1.06 (H) 11/21/2018   Plans: 1. Severe CDI (+) with significant CDAD  Previously treated with 10 day course of oral vancomycin 125 mg QID in early December.   Recurrent abdominal cramping and watery diarrhea beginning on/around 11/19/2018. Repeat stool studies (+) for recurrent CDI (confirmed by PCR).   WBC elevated at 16.1.  Treatment recommendations do not support step down treatment with bacteriostatic agent metronidazole following treatment with bacteriostatic agent vancomycin, which is thought to offer superior coverage.   Recommendations are for bacteriocidal agent fidaxomicin 200 mg BID x 10 days, however due to patient's insurance coverage (Medicare), her co-pay was around $1200 ($4000 without insurance). She was unable to utilized manufacturer co-pay assistance card due to having governmental coverage.  Up to date recommends pulse taped vancomycin dosing in the setting of patient with severe CDI. Criteria for severe CDI met as patient has had short term recurrence and WBC > 15,000.    Confirmed with retail pharmacist at [facility]. Due to patient's short term recurrence, extended  coverage is indicated.   Allergy list assessed. No documented allergy to ABX. Tolerated vancomycin well in the past. Prescription sent in for:  Vancomycin 125 mg  125 mg QID x 10 days (11/25/2018 - 12/04/2018), then  125 mg BID x 7 days (12/05/2018 - 12/11/2018), then  125 mg daily x 7 days (12/12/2018 - 12/18/2018), then  125 QOD x 14 days (12/19/2018 - 01/01/2019) 2. Reinforced strict handwashing with SOAP and WATER only to prevent transmission. Will need to clean toilet and surfaces with bleach.  3. Encourage patient to increase fluid intake using ORS (gatorade and pedialyte) to avoid dehydration and electrolyte derangements. Increase intake of K+ rich foods.  4. AVOID antidiarrheals.  5. Contact the clinic if not improving, or if unable to consume adequate amounts of fluids, and we can bring her back in for IVFs  Patient encouraged to contact clinic with any questions or concerns on Monday. She was encouraged to start the medication as soon as it is filled, and NOT to miss any doses.   Honor Loh, MSN, APRN, FNP-C, CEN Oncology/Hematology Nurse Practitioner  [facility] 11/24/18, 4:50 PM

## 2018-12-05 ENCOUNTER — Other Ambulatory Visit: Payer: Medicare Other

## 2018-12-12 ENCOUNTER — Inpatient Hospital Stay: Payer: Medicare Other

## 2018-12-12 VITALS — BP 132/73 | HR 71 | Temp 97.7°F | Resp 18

## 2018-12-12 DIAGNOSIS — D45 Polycythemia vera: Secondary | ICD-10-CM

## 2018-12-12 LAB — CBC WITH DIFFERENTIAL/PLATELET
Abs Immature Granulocytes: 0.05 10*3/uL (ref 0.00–0.07)
Basophils Absolute: 0.2 10*3/uL — ABNORMAL HIGH (ref 0.0–0.1)
Basophils Relative: 1 %
Eosinophils Absolute: 0.5 10*3/uL (ref 0.0–0.5)
Eosinophils Relative: 4 %
HCT: 43.6 % (ref 36.0–46.0)
Hemoglobin: 15.1 g/dL — ABNORMAL HIGH (ref 12.0–15.0)
Immature Granulocytes: 0 %
Lymphocytes Relative: 7 %
Lymphs Abs: 0.8 10*3/uL (ref 0.7–4.0)
MCH: 32.3 pg (ref 26.0–34.0)
MCHC: 34.6 g/dL (ref 30.0–36.0)
MCV: 93.2 fL (ref 80.0–100.0)
Monocytes Absolute: 1.1 10*3/uL — ABNORMAL HIGH (ref 0.1–1.0)
Monocytes Relative: 9 %
Neutro Abs: 9.5 10*3/uL — ABNORMAL HIGH (ref 1.7–7.7)
Neutrophils Relative %: 79 %
Platelets: 380 10*3/uL (ref 150–400)
RBC: 4.68 MIL/uL (ref 3.87–5.11)
RDW: 15.1 % (ref 11.5–15.5)
WBC: 12.1 10*3/uL — ABNORMAL HIGH (ref 4.0–10.5)
nRBC: 0 % (ref 0.0–0.2)

## 2018-12-12 NOTE — Patient Instructions (Signed)

## 2018-12-19 ENCOUNTER — Ambulatory Visit: Payer: Medicare Other | Admitting: Hematology and Oncology

## 2018-12-19 ENCOUNTER — Other Ambulatory Visit: Payer: Medicare Other

## 2018-12-20 ENCOUNTER — Other Ambulatory Visit: Payer: Self-pay | Admitting: Hematology and Oncology

## 2018-12-20 ENCOUNTER — Inpatient Hospital Stay: Payer: Medicare Other | Attending: Hematology and Oncology

## 2018-12-20 ENCOUNTER — Inpatient Hospital Stay (HOSPITAL_BASED_OUTPATIENT_CLINIC_OR_DEPARTMENT_OTHER): Payer: Medicare Other | Admitting: Hematology and Oncology

## 2018-12-20 ENCOUNTER — Encounter: Payer: Self-pay | Admitting: Hematology and Oncology

## 2018-12-20 ENCOUNTER — Inpatient Hospital Stay: Payer: Medicare Other

## 2018-12-20 VITALS — BP 139/80 | HR 64 | Temp 97.9°F | Resp 18 | Wt 219.5 lb

## 2018-12-20 VITALS — BP 118/74 | HR 80 | Temp 96.0°F | Resp 16

## 2018-12-20 DIAGNOSIS — N189 Chronic kidney disease, unspecified: Secondary | ICD-10-CM | POA: Diagnosis not present

## 2018-12-20 DIAGNOSIS — M1990 Unspecified osteoarthritis, unspecified site: Secondary | ICD-10-CM | POA: Insufficient documentation

## 2018-12-20 DIAGNOSIS — I129 Hypertensive chronic kidney disease with stage 1 through stage 4 chronic kidney disease, or unspecified chronic kidney disease: Secondary | ICD-10-CM

## 2018-12-20 DIAGNOSIS — Z7984 Long term (current) use of oral hypoglycemic drugs: Secondary | ICD-10-CM

## 2018-12-20 DIAGNOSIS — Z7189 Other specified counseling: Secondary | ICD-10-CM

## 2018-12-20 DIAGNOSIS — J449 Chronic obstructive pulmonary disease, unspecified: Secondary | ICD-10-CM | POA: Diagnosis not present

## 2018-12-20 DIAGNOSIS — A0472 Enterocolitis due to Clostridium difficile, not specified as recurrent: Secondary | ICD-10-CM | POA: Insufficient documentation

## 2018-12-20 DIAGNOSIS — D45 Polycythemia vera: Secondary | ICD-10-CM

## 2018-12-20 DIAGNOSIS — Z87442 Personal history of urinary calculi: Secondary | ICD-10-CM | POA: Diagnosis not present

## 2018-12-20 DIAGNOSIS — E1122 Type 2 diabetes mellitus with diabetic chronic kidney disease: Secondary | ICD-10-CM | POA: Diagnosis not present

## 2018-12-20 DIAGNOSIS — G4730 Sleep apnea, unspecified: Secondary | ICD-10-CM | POA: Insufficient documentation

## 2018-12-20 DIAGNOSIS — E785 Hyperlipidemia, unspecified: Secondary | ICD-10-CM

## 2018-12-20 DIAGNOSIS — Z79899 Other long term (current) drug therapy: Secondary | ICD-10-CM | POA: Diagnosis not present

## 2018-12-20 DIAGNOSIS — G40909 Epilepsy, unspecified, not intractable, without status epilepticus: Secondary | ICD-10-CM | POA: Diagnosis not present

## 2018-12-20 DIAGNOSIS — R0601 Orthopnea: Secondary | ICD-10-CM | POA: Insufficient documentation

## 2018-12-20 DIAGNOSIS — Z7982 Long term (current) use of aspirin: Secondary | ICD-10-CM | POA: Diagnosis not present

## 2018-12-20 DIAGNOSIS — Z87891 Personal history of nicotine dependence: Secondary | ICD-10-CM | POA: Diagnosis not present

## 2018-12-20 LAB — CBC WITH DIFFERENTIAL/PLATELET
Abs Immature Granulocytes: 0.07 10*3/uL (ref 0.00–0.07)
Basophils Absolute: 0.2 10*3/uL — ABNORMAL HIGH (ref 0.0–0.1)
Basophils Relative: 1 %
Eosinophils Absolute: 0.4 10*3/uL (ref 0.0–0.5)
Eosinophils Relative: 4 %
HCT: 43.8 % (ref 36.0–46.0)
Hemoglobin: 15.3 g/dL — ABNORMAL HIGH (ref 12.0–15.0)
Immature Granulocytes: 1 %
Lymphocytes Relative: 6 %
Lymphs Abs: 0.7 10*3/uL (ref 0.7–4.0)
MCH: 32.6 pg (ref 26.0–34.0)
MCHC: 34.9 g/dL (ref 30.0–36.0)
MCV: 93.4 fL (ref 80.0–100.0)
Monocytes Absolute: 1.2 10*3/uL — ABNORMAL HIGH (ref 0.1–1.0)
Monocytes Relative: 10 %
Neutro Abs: 9.7 10*3/uL — ABNORMAL HIGH (ref 1.7–7.7)
Neutrophils Relative %: 78 %
Platelets: 389 10*3/uL (ref 150–400)
RBC: 4.69 MIL/uL (ref 3.87–5.11)
RDW: 15.2 % (ref 11.5–15.5)
WBC: 12.2 10*3/uL — ABNORMAL HIGH (ref 4.0–10.5)
nRBC: 0 % (ref 0.0–0.2)

## 2018-12-20 LAB — COMPREHENSIVE METABOLIC PANEL
ALT: 33 U/L (ref 0–44)
AST: 32 U/L (ref 15–41)
Albumin: 4.2 g/dL (ref 3.5–5.0)
Alkaline Phosphatase: 69 U/L (ref 38–126)
Anion gap: 10 (ref 5–15)
BUN: 16 mg/dL (ref 8–23)
CO2: 25 mmol/L (ref 22–32)
Calcium: 9.5 mg/dL (ref 8.9–10.3)
Chloride: 100 mmol/L (ref 98–111)
Creatinine, Ser: 0.81 mg/dL (ref 0.44–1.00)
GFR calc Af Amer: >60 mL/min (ref >60)
GFR calc non Af Amer: >60 mL/min (ref >60)
Glucose, Bld: 97 mg/dL (ref 70–99)
Potassium: 3.6 mmol/L (ref 3.5–5.1)
Sodium: 135 mmol/L (ref 135–145)
Total Bilirubin: 0.9 mg/dL (ref 0.3–1.2)
Total Protein: 6.7 g/dL (ref 6.5–8.1)

## 2018-12-20 LAB — FERRITIN: Ferritin: 32 ng/mL (ref 11–307)

## 2018-12-20 NOTE — Progress Notes (Signed)
[facility] day:  12/20/2018  Chief Complaint: Angelica Juarez is a 78 y.o. 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  HPI:  The patient was last seen in the hematology clinic on 11/21/2018.  At that time, she noted diarrhea with in excess of 5 watery stools/day.  She had a history of C diff on 10/28/2018.  CBC revealed a hematocrit of 45.5, hemoglobin 15.7, MCV 91.2, platelets 389,000, WBC 16,100 with an ANC of 14,000.  She underwent phlebotomy.  Stool returned (+) for  C. diff by PCR.  Recommendations were for fidaxomicin 200 mg BID x 10 days, however due to patient's insurance coverage (Medicare), her co-pay was around $1200 ($4000 without insurance). She was unable to utilized manufacturer co-pay assistance card due to having governmental coverage.  Prescription sent in for vancomycin 125 mg QID x 10 days (11/25/2018 - 12/04/2018), then 125 mg BID x 7 days (12/05/2018 - 12/11/2018) then 125 mg q day x 7 days (12/12/2018 - 12/18/2018) then 125 mg QOD x 14 days (12/19/2018 - 01/01/2019).          CBC on 12/12/2018 revealed a hematocrit of 43.6, hemoglobin 15.1, MCV 93.2, platelets 380,000, WBC 12,1000 with an ANC of 9500.  She underwent phlebotomy.  During the interim, patient is doing well. Stools are formed, but soft at this point. She denies any abdominal pain or cramping.  She continues on her oral vancomycin; has 2 doses remaining. She denies any fevers or sweats. Patient denies bleeding; no hematochezia, melena, or gross hematuria. She has not experienced any areas of unexplained bruising. She continues on hydroxyurea 500 mg daily.   Patient with orthopnea when supine. She has known OSAH syndrome and is on nocturnal PAP therapy.   Patient advises that she maintains an adequate appetite. She is eating well. Weight today is 219 lb 7.5 oz (99.6 kg), which compared to her last visit to the clinic, represents  a stable weight.   Patient denies pain in the clinic today.   Past Medical History:  Diagnosis Date  . Allergy    Seasonal  . Arthritis   . 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 Chronic kidney disease    chronic renal insufficiency  . Colon polyps 05/21/14  . COPD (chronic obstructive pulmonary disease) ([place])   . Diabetes mellitus without complication ([place])   . Dyspnea   . Fracture closed, humerus, shaft    right   . History of kidney stones    40 years ago  . Hyperlipidemia 04/30/14  . Hypertension   . Impingement syndrome of right shoulder 12/29/15  . Personal history of radiation therapy   . Pneumonia    hx  . Rotator cuff tear 04/21/16   right  . Seizures ([place])   . Sleep apnea     Past Surgical History:  Procedure Laterality Date  . ABDOMINAL HYSTERECTOMY    . 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 EYE SURGERY     bilateral cataracts  . JOINT REPLACEMENT     bilateral knee replacement  . NASAL SEPTUM SURGERY    . REPLACEMENT TOTAL KNEE Bilateral 2004  . SHOULDER ARTHROSCOPY WITH BICEPSTENOTOMY Right 05/25/2016   Procedure: SHOULDER ARTHROSCOPY WITH BICEPSTENOTOMY;  Surgeon: Leanor Kail, MD;  Location: [facility] ORS;  Service: Orthopedics;  Laterality: Right;  . SHOULDER ARTHROSCOPY WITH DISTAL CLAVICLE RESECTION Right 05/25/2016  Procedure: SHOULDER ARTHROSCOPY WITH DISTAL CLAVICLE RESECTION;  Surgeon: Leanor Kail, MD;  Location: [facility] ORS;  Service: Orthopedics;  Laterality: Right;  . SHOULDER ARTHROSCOPY WITH OPEN ROTATOR CUFF REPAIR Right 05/25/2016   Procedure: SHOULDER ARTHROSCOPY WITH OPEN ROTATOR CUFF REPAIR;  Surgeon: Leanor Kail, MD;  Location: [facility] ORS;  Service: Orthopedics;  Laterality: Right;  . SUBACROMIAL DECOMPRESSION Right 05/25/2016   Procedure: SUBACROMIAL DECOMPRESSION;  Surgeon: Leanor Kail, MD;  Location: [facility] ORS;  Service: Orthopedics;  Laterality: Right;    Family History  Problem Relation Age of Onset  .  Breast cancer Paternal Aunt 25  . Diabetes Father   . Heart disease Father     Social History:  reports that she quit smoking about 13 years ago. She has a 40.00 pack-year smoking history. She has never used smokeless tobacco. She reports that she does not drink alcohol or use drugs.  Patient moved to [place] in 2001 from [place]. Patient is a former 1 ppd smoker x 40 years; quit in 2006. She is retired from [employer]. Patient denies known exposures to radiation on toxins.  Her significant other is [employer].  The patient is alone today.  Allergies:  Allergies  Allergen Reactions  . Iodinated Diagnostic Agents Anaphylaxis    Other reaction(s): Other (See Comments) Throat swells and extreme hives  . Latex Itching  . Phenobarbital Hives  . Tape Rash    silicones    Current Medications: Current Outpatient Medications  Medication Sig Dispense Refill  . albuterol (ACCUNEB) 1.25 MG/3ML nebulizer solution Take 1 ampule by nebulization every 6 (six) hours as needed. wheezing  3  . albuterol (PROVENTIL HFA;VENTOLIN HFA) 108 (90 Base) MCG/ACT inhaler Inhale 2 puffs into the lungs every 6 (six) hours as needed. 3 Inhaler 2  . aspirin 81 MG tablet Take 81 mg by mouth daily.    Marland Kitchen atorvastatin (LIPITOR) 40 MG tablet Take 40 mg by mouth daily.    . B Complex Vitamins (VITAMIN B COMPLEX PO) Take 1 tablet by mouth daily.    . Calcium Carbonate-Vitamin D 600-200 MG-UNIT CAPS Take 1 capsule by mouth daily.    . cloNIDine (CATAPRES) 0.2 MG tablet Take 0.2 mg by mouth 2 (two) times daily.    . diphenhydrAMINE (BENADRYL) 25 mg capsule Take 25 mg by mouth daily.    Marland Kitchen EPINEPHrine (EPIPEN 2-PAK) 0.3 mg/0.3 mL IJ SOAJ injection 0.3 mg once. Reported on 05/25/2016    . fluticasone (FLONASE) 50 MCG/ACT nasal spray Place 2 sprays into both nostrils daily. 16 g 2  . Fluticasone-Umeclidin-Vilant (TRELEGY ELLIPTA) 100-62.5-25 MCG/INH AEPB Inhale 1 puff into the lungs daily. 1 each 4  . furosemide (LASIX) 20 MG tablet Take  1 tablet (20 mg total) by mouth 2 (two) times daily. 60 tablet 0  . glucose blood (ONE TOUCH ULTRA TEST) test strip USE ONE STRIP TO CHECK GLUCOSE ONCE DAILY    . hydrALAZINE (APRESOLINE) 100 MG tablet Take 50 mg by mouth 3 (three) times daily.     . hydroxyurea (HYDREA) 500 MG capsule Take 1 capsule (500 mg total) by mouth daily. May take with food to minimize GI side effects. 90 capsule 0  . levETIRAcetam (KEPPRA) 750 MG tablet Take 750 mg by mouth 2 (two) times daily.    . metFORMIN (GLUCOPHAGE) 500 MG tablet Take 500 mg by mouth 2 (two) times daily with a meal.    . metoprolol succinate (TOPROL-XL) 100 MG 24 hr tablet Take 100 mg by mouth daily.    Marland Kitchen  montelukast (SINGULAIR) 10 MG tablet TAKE 1 TABLET BY MOUTH EVERY DAY 90 tablet 0  . Multiple Vitamin (MULTI-VITAMINS) TABS Take 1 tablet by mouth daily.    Marland Kitchen NIFEdipine (NIFEDICAL XL) 30 MG 24 hr tablet Take 30 mg by mouth daily.    Marland Kitchen OVER THE COUNTER MEDICATION Place 1 drop into both eyes daily. Allergy eye drops    . sitaGLIPtin (JANUVIA) 25 MG tablet Take 1 tablet by mouth daily.    . traZODone (DESYREL) 50 MG tablet Take 50-100 mg by mouth at bedtime as needed. Sleep  0  . valsartan-hydrochlorothiazide (DIOVAN-HCT) 320-25 MG tablet Take 1 tablet by mouth daily.  0  . vancomycin (VANCOCIN) 125 MG capsule Take 1 capsule (125 mg total) by mouth 4 (four) times daily for 10 days, THEN 1 capsule (125 mg total) 2 (two) times daily for 7 days, THEN 1 capsule (125 mg total) daily for 7 days, THEN 1 capsule (125 mg total) every other day for 14 days. 68 capsule 0  . vitamin C (ASCORBIC ACID) 500 MG tablet Take 500 mg by mouth 2 (two) times daily.    . ondansetron (ZOFRAN) 8 MG tablet Take 1 tablet (8 mg total) by mouth every 8 (eight) hours as needed for nausea or vomiting. (Patient not taking: Reported on 11/21/2018) 20 tablet 1   No current facility-administered medications for this visit.     Review of Systems  Constitutional: Negative.  Negative  for chills, diaphoresis, fever, malaise/fatigue and weight loss (stable).       Doing well.  HENT: Negative for congestion, ear discharge, ear pain, nosebleeds, sinus pain and sore throat.   Eyes: Negative.  Negative for double vision, photophobia, pain and discharge.       Macular degeneration.  Respiratory: Positive for shortness of breath (exertional). Negative for cough, hemoptysis and sputum production.        COPD.  Sleep apnea.   Cardiovascular: Positive for orthopnea. Negative for chest pain, palpitations, leg swelling and PND.  Gastrointestinal: Negative for abdominal pain, blood in stool, constipation, diarrhea, heartburn, melena, nausea and vomiting.       Stools formed and soft.  Genitourinary: Negative.  Negative for dysuria, frequency, hematuria and urgency.  Musculoskeletal: Positive for back pain and joint pain (DDD; osteoarthritis). Negative for falls, myalgias and neck pain.  Skin: Negative.  Negative for itching and rash.  Neurological: Negative.  Negative for dizziness, tremors, sensory change, speech change, focal weakness, weakness and headaches.  Endo/Heme/Allergies: Negative for environmental allergies (seasonal). Does not bruise/bleed easily.  Psychiatric/Behavioral: Negative.  Negative for depression and memory loss. The patient is not nervous/anxious and does not have insomnia.   All other systems reviewed and are negative.  Performance status (ECOG): 1  Vital Signs: BP 139/80 (BP Location: Left Arm, Patient Position: Sitting)   Pulse 64   Temp 97.9 F (36.6 C) (Oral)   Resp 18   Wt 219 lb 7.5 oz (99.6 kg)   SpO2 94%   BMI 34.37 kg/m   Physical Exam  Constitutional: She is oriented to person, place, and time and well-developed, well-nourished, and in no distress. No distress.  HENT:  Head: Normocephalic and atraumatic.  Mouth/Throat: Oropharynx is clear and moist and mucous membranes are normal. She has dentures. No oropharyngeal exudate.  Short gray  hair.  Eyes: Pupils are equal, round, and reactive to light. Conjunctivae and EOM are normal. No scleral icterus.  Glasses.  Brown eyes.  Neck: Normal range of motion. Neck supple.  No JVD present.  Cardiovascular: Normal rate, regular rhythm, normal heart sounds and intact distal pulses. Exam reveals no gallop and no friction rub.  No murmur heard. Pulmonary/Chest: Effort normal and breath sounds normal. No respiratory distress. She has no wheezes. She has no rales.  Abdominal: Soft. Bowel sounds are normal. She exhibits no distension and no mass. There is no abdominal tenderness. There is no rebound and no guarding.  Musculoskeletal: Normal range of motion.        General: No tenderness or edema.  Lymphadenopathy:    She has no cervical adenopathy.  Neurological: She is alert and oriented to person, place, and time. Gait normal.  Skin: Skin is warm and dry. No rash noted. She is not diaphoretic. No erythema. No pallor.  Psychiatric: Mood, affect and judgment normal.  Nursing note and vitals reviewed.   Appointment on 12/20/2018  Component Date Value Ref Range Status  . Sodium 12/20/2018 135  135 - 145 mmol/L Final  . Potassium 12/20/2018 3.6  3.5 - 5.1 mmol/L Final  . Chloride 12/20/2018 100  98 - 111 mmol/L Final  . CO2 12/20/2018 25  22 - 32 mmol/L Final  . Glucose, Bld 12/20/2018 97  70 - 99 mg/dL Final  . BUN 12/20/2018 16  8 - 23 mg/dL Final  . Creatinine, Ser 12/20/2018 0.81  0.44 - 1.00 mg/dL Final  . Calcium 12/20/2018 9.5  8.9 - 10.3 mg/dL Final  . Total Protein 12/20/2018 6.7  6.5 - 8.1 g/dL Final  . Albumin 12/20/2018 4.2  3.5 - 5.0 g/dL Final  . AST 12/20/2018 32  15 - 41 U/L Final  . ALT 12/20/2018 33  0 - 44 U/L Final  . Alkaline Phosphatase 12/20/2018 69  38 - 126 U/L Final  . Total Bilirubin 12/20/2018 0.9  0.3 - 1.2 mg/dL Final  . GFR calc non Af Amer 12/20/2018 >60  >60 mL/min Final  . GFR calc Af Amer 12/20/2018 >60  >60 mL/min Final  . Anion gap 12/20/2018 10   5 - 15 Final   Performed at [facility], [address] [place], [place] [address]  . WBC 12/20/2018 12.2* 4.0 - 10.5 K/uL Final  . RBC 12/20/2018 4.69  3.87 - 5.11 MIL/uL Final  . Hemoglobin 12/20/2018 15.3* 12.0 - 15.0 g/dL Final  . HCT 12/20/2018 43.8  36.0 - 46.0 % Final  . MCV 12/20/2018 93.4  80.0 - 100.0 fL Final  . MCH 12/20/2018 32.6  26.0 - 34.0 pg Final  . MCHC 12/20/2018 34.9  30.0 - 36.0 g/dL Final  . RDW 12/20/2018 15.2  11.5 - 15.5 % Final  . Platelets 12/20/2018 389  150 - 400 K/uL Final  . nRBC 12/20/2018 0.0  0.0 - 0.2 % Final  . Neutrophils Relative % 12/20/2018 78  % Final  . Neutro Abs 12/20/2018 9.7* 1.7 - 7.7 K/uL Final  . Lymphocytes Relative 12/20/2018 6  % Final  . Lymphs Abs 12/20/2018 0.7  0.7 - 4.0 K/uL Final  . Monocytes Relative 12/20/2018 10  % Final  . Monocytes Absolute 12/20/2018 1.2* 0.1 - 1.0 K/uL Final  . Eosinophils Relative 12/20/2018 4  % Final  . Eosinophils Absolute 12/20/2018 0.4  0.0 - 0.5 K/uL Final  . Basophils Relative 12/20/2018 1  % Final  . Basophils Absolute 12/20/2018 0.2* 0.0 - 0.1 K/uL Final  . Immature Granulocytes 12/20/2018 1  % Final  . Abs Immature Granulocytes 12/20/2018 0.07  0.00 - 0.07 K/uL  Final   Performed at [facility], [address], [place], [place] [address]    Assessment:  Angelica Juarez is a 78 y.o. female with polycythemia rubra vera.  She has had sleep apnea x 7 years and uses CPAP.  She has a 40 pack year smoking history (stopped 13 years ago).  She denies and cardiac history.  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  There was no significant dysplasia or increased blasts.  Overall features were c/w polycythemia rubra vera.  Work up on 08/08/2018 revealed a WBC of 16,700 ([place] 13,800). Hemoglobin 15.3, hematocrit 45.5, MCV 87.9, and platelets 552,000. Erythropoietin level was normal at 2.9 mIU/mL. BCR/ABL  demonstrated no BCR or ABL gene rearrangements. Carbon monoxide level was normal at 3.0% (0-3.6 %).  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   She began a phlebotomy program on 09/07/2018 (last 10/15/2018).  Hematocrit goal is <=42.  She was diagnosed with C difficile + diarrhea on 10/10/2018.  She was prescribed oral vancomycin.  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  She is on an extended course of oral vancomycin (2 pills left).  Symptomatically, her stool is soft and formed.  Exam is unremarkable.  Plan: 1. Labs today:  CBC with diff, CMP. 2. Polycythemia rubra vera (PV) Hematocrit 43.8.  Hemoglobin 15.3.  Platelets 389,000. Goal hematocrit <= 42.  Goal platelet count < 400,000. Clinically doing well on hydroxyurea. Patient still requiring phlebotomies. Discuss increase in phlebotomy volume from 250 cc to 300 cc. Continue hydroxyurea 500 mg a day. Consider increasing hydroxyurea by 1 pill/week (14%). 3. C difficile + diarrhea  Complete course of oral vancomycin.  Patient to notify clinic if return of diarrhea. 4. RTC every 2 weeks for labs (CBC) +/- phlebotomy. 5. RTC in 4 weeks for MD assessment, labs (CBC with diff, CMP), and +/- phlebotomy.    Honor Loh, NP  12/20/2018, 2:00 PM   I saw and evaluated the patient, participating in the key portions of the service and reviewing pertinent diagnostic studies and records.  I reviewed the nurse practitioner's note and agree with the findings and the plan.  The assessment and plan were discussed with the patient.  Several questions were asked by the patient and answered.   Nolon Stalls, MD 12/20/2018,2:00 PM

## 2018-12-20 NOTE — Progress Notes (Signed)
Pt here for follow up. States she has 2 pills left of antibiotic for CDIFF. Reports she has soft stools EOD but denies any other symptoms.

## 2018-12-24 ENCOUNTER — Other Ambulatory Visit: Payer: Self-pay

## 2018-12-24 DIAGNOSIS — D45 Polycythemia vera: Secondary | ICD-10-CM

## 2018-12-24 MED ORDER — HYDROXYUREA 500 MG PO CAPS: 500.0000 mg | ORAL_CAPSULE | Freq: Every day | ORAL | 0 refills | Status: DC

## 2018-12-25 ENCOUNTER — Ambulatory Visit: Payer: Self-pay

## 2018-12-26 ENCOUNTER — Ambulatory Visit (INDEPENDENT_AMBULATORY_CARE_PROVIDER_SITE_OTHER): Payer: Medicare Other

## 2018-12-26 DIAGNOSIS — J301 Allergic rhinitis due to pollen: Secondary | ICD-10-CM

## 2019-01-03 ENCOUNTER — Inpatient Hospital Stay: Payer: Medicare Other

## 2019-01-03 ENCOUNTER — Other Ambulatory Visit: Payer: Medicare Other

## 2019-01-03 DIAGNOSIS — D45 Polycythemia vera: Secondary | ICD-10-CM | POA: Diagnosis not present

## 2019-01-03 LAB — CBC
HCT: 40.2 % (ref 36.0–46.0)
Hemoglobin: 14 g/dL (ref 12.0–15.0)
MCH: 33.1 pg (ref 26.0–34.0)
MCHC: 34.8 g/dL (ref 30.0–36.0)
MCV: 95 fL (ref 80.0–100.0)
Platelets: 309 10*3/uL (ref 150–400)
RBC: 4.23 MIL/uL (ref 3.87–5.11)
RDW: 14.9 % (ref 11.5–15.5)
WBC: 11.2 10*3/uL — ABNORMAL HIGH (ref 4.0–10.5)
nRBC: 0 % (ref 0.0–0.2)

## 2019-01-17 ENCOUNTER — Inpatient Hospital Stay: Payer: Medicare Other | Attending: Urgent Care

## 2019-01-17 ENCOUNTER — Inpatient Hospital Stay: Payer: Medicare Other | Attending: Urgent Care | Admitting: Urgent Care

## 2019-01-17 ENCOUNTER — Encounter: Payer: Self-pay | Admitting: Hematology and Oncology

## 2019-01-17 ENCOUNTER — Encounter: Payer: Self-pay | Admitting: Urgent Care

## 2019-01-17 VITALS — BP 123/76 | HR 73 | Temp 97.7°F | Resp 18 | Ht 67.0 in | Wt 218.8 lb

## 2019-01-17 DIAGNOSIS — J449 Chronic obstructive pulmonary disease, unspecified: Secondary | ICD-10-CM | POA: Diagnosis not present

## 2019-01-17 DIAGNOSIS — E785 Hyperlipidemia, unspecified: Secondary | ICD-10-CM

## 2019-01-17 DIAGNOSIS — I10 Essential (primary) hypertension: Secondary | ICD-10-CM | POA: Diagnosis not present

## 2019-01-17 DIAGNOSIS — Z803 Family history of malignant neoplasm of breast: Secondary | ICD-10-CM | POA: Insufficient documentation

## 2019-01-17 DIAGNOSIS — E119 Type 2 diabetes mellitus without complications: Secondary | ICD-10-CM | POA: Insufficient documentation

## 2019-01-17 DIAGNOSIS — Z7984 Long term (current) use of oral hypoglycemic drugs: Secondary | ICD-10-CM | POA: Insufficient documentation

## 2019-01-17 DIAGNOSIS — Z79899 Other long term (current) drug therapy: Secondary | ICD-10-CM

## 2019-01-17 DIAGNOSIS — Z7982 Long term (current) use of aspirin: Secondary | ICD-10-CM | POA: Diagnosis not present

## 2019-01-17 DIAGNOSIS — D45 Polycythemia vera: Secondary | ICD-10-CM | POA: Diagnosis not present

## 2019-01-17 DIAGNOSIS — M129 Arthropathy, unspecified: Secondary | ICD-10-CM | POA: Insufficient documentation

## 2019-01-17 DIAGNOSIS — A09 Infectious gastroenteritis and colitis, unspecified: Secondary | ICD-10-CM

## 2019-01-17 DIAGNOSIS — Z8589 Personal history of malignant neoplasm of other organs and systems: Secondary | ICD-10-CM | POA: Diagnosis not present

## 2019-01-17 DIAGNOSIS — Z87442 Personal history of urinary calculi: Secondary | ICD-10-CM | POA: Diagnosis not present

## 2019-01-17 DIAGNOSIS — J302 Other seasonal allergic rhinitis: Secondary | ICD-10-CM

## 2019-01-17 DIAGNOSIS — G4730 Sleep apnea, unspecified: Secondary | ICD-10-CM | POA: Insufficient documentation

## 2019-01-17 DIAGNOSIS — J3489 Other specified disorders of nose and nasal sinuses: Secondary | ICD-10-CM | POA: Insufficient documentation

## 2019-01-17 DIAGNOSIS — Z87891 Personal history of nicotine dependence: Secondary | ICD-10-CM | POA: Insufficient documentation

## 2019-01-17 LAB — CBC WITH DIFFERENTIAL/PLATELET
Abs Immature Granulocytes: 0.05 10*3/uL (ref 0.00–0.07)
Basophils Absolute: 0.1 10*3/uL (ref 0.0–0.1)
Basophils Relative: 1 %
Eosinophils Absolute: 0.3 10*3/uL (ref 0.0–0.5)
Eosinophils Relative: 3 %
HCT: 44.1 % (ref 36.0–46.0)
Hemoglobin: 15.6 g/dL — ABNORMAL HIGH (ref 12.0–15.0)
Immature Granulocytes: 0 %
Lymphocytes Relative: 7 %
Lymphs Abs: 0.8 10*3/uL (ref 0.7–4.0)
MCH: 33.5 pg (ref 26.0–34.0)
MCHC: 35.4 g/dL (ref 30.0–36.0)
MCV: 94.8 fL (ref 80.0–100.0)
Monocytes Absolute: 1.2 10*3/uL — ABNORMAL HIGH (ref 0.1–1.0)
Monocytes Relative: 10 %
Neutro Abs: 9.7 10*3/uL — ABNORMAL HIGH (ref 1.7–7.7)
Neutrophils Relative %: 79 %
Platelets: 364 10*3/uL (ref 150–400)
RBC: 4.65 MIL/uL (ref 3.87–5.11)
RDW: 14.3 % (ref 11.5–15.5)
WBC: 12.2 10*3/uL — ABNORMAL HIGH (ref 4.0–10.5)
nRBC: 0 % (ref 0.0–0.2)

## 2019-01-17 LAB — COMPREHENSIVE METABOLIC PANEL
ALT: 29 U/L (ref 0–44)
AST: 30 U/L (ref 15–41)
Albumin: 4.2 g/dL (ref 3.5–5.0)
Alkaline Phosphatase: 75 U/L (ref 38–126)
Anion gap: 9 (ref 5–15)
BUN: 18 mg/dL (ref 8–23)
CO2: 27 mmol/L (ref 22–32)
Calcium: 9.8 mg/dL (ref 8.9–10.3)
Chloride: 102 mmol/L (ref 98–111)
Creatinine, Ser: 0.88 mg/dL (ref 0.44–1.00)
GFR calc Af Amer: >60 mL/min (ref >60)
GFR calc non Af Amer: >60 mL/min (ref >60)
Glucose, Bld: 107 mg/dL — ABNORMAL HIGH (ref 70–99)
Potassium: 3.4 mmol/L — ABNORMAL LOW (ref 3.5–5.1)
Sodium: 138 mmol/L (ref 135–145)
Total Bilirubin: 1 mg/dL (ref 0.3–1.2)
Total Protein: 6.7 g/dL (ref 6.5–8.1)

## 2019-01-17 NOTE — Progress Notes (Signed)
No new changes noted today 

## 2019-01-17 NOTE — Progress Notes (Signed)
[facility] day:  01/17/2019  Chief Complaint: Angelica Juarez is a 78 y.o. 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  HPI:  The patient was last seen in the hematology clinic on 12/20/2018.  At that time, patient was doing well.  Stools were soft, yet formed.  She denied abdominal pain.  Patient continued on course of oral vancomycin (2 doses remaining).  No B symptoms.  Continued on hydroxyurea 500 mg daily.  Eating well; weight stable.  Exam was unremarkable.  WBC 12,200 ([place] 9700).  Hemoglobin 15.3, hematocrit 43.8, MCV 93.4, and platelets 389,000.  Patient underwent small-volume therapeutic phlebotomy; volume increased to 300 cc (previoulsy 250 cc).   CBC on 01/03/2019 revealed a WBC of 11,200.  Hemoglobin 14.0, hematocrit 40.2, MCV 95.0, and platelets 309,000.  No phlebotomy was required.  In the interim, patient notes that her energy has been low.  She notes shortness of breath when she first wakes up in the morning. Of note, patient with known OSAH syndrome. She uses nocturnal PAP therapy. Patient notes that her shortness of breath is experienced, for a short period of time, following the removal of her CPAP device. She complains of rhinorrhea related to her seasonal allergies.  Patient receives allergy injections every 4 weeks.  Patient denies any nausea or vomiting.  She has not experienced any abdominal pain or cramping.  Her stools have been formed; no recurrence of diarrhea following treatment with pulse dose oral vancomycin taper. Patient denies bleeding; no hematochezia, melena, or gross hematuria.   Patient advises that she maintains an adequate appetite. She is eating well. Weight today is 218 lb 12.9 oz (99.2 kg), which compared to her last visit to the clinic, represents a 1 pound decrease.  Patient denies pain in the clinic today.   Past Medical History:  Diagnosis Date  . Allergy     Seasonal  . Arthritis   . 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 Chronic kidney disease    chronic renal insufficiency  . Colon polyps 05/21/14  . COPD (chronic obstructive pulmonary disease) ([place])   . Diabetes mellitus without complication ([place])   . Dyspnea   . Fracture closed, humerus, shaft    right   . History of kidney stones    40 years ago  . Hyperlipidemia 04/30/14  . Hypertension   . Impingement syndrome of right shoulder 12/29/15  . Personal history of radiation therapy   . Pneumonia    hx  . Rotator cuff tear 04/21/16   right  . Seizures ([place])   . Sleep apnea     Past Surgical History:  Procedure Laterality Date  . ABDOMINAL HYSTERECTOMY    . 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 EYE SURGERY     bilateral cataracts  . JOINT REPLACEMENT     bilateral knee replacement  . NASAL SEPTUM SURGERY    . REPLACEMENT TOTAL KNEE Bilateral 2004  . SHOULDER ARTHROSCOPY WITH BICEPSTENOTOMY Right 05/25/2016   Procedure: SHOULDER ARTHROSCOPY WITH BICEPSTENOTOMY;  Surgeon: Leanor Kail, MD;  Location: [facility] ORS;  Service: Orthopedics;  Laterality: Right;  . SHOULDER ARTHROSCOPY WITH DISTAL CLAVICLE RESECTION Right 05/25/2016   Procedure: SHOULDER ARTHROSCOPY WITH DISTAL CLAVICLE RESECTION;  Surgeon: Leanor Kail, MD;  Location: [facility] ORS;  Service: Orthopedics;  Laterality: Right;  . SHOULDER ARTHROSCOPY WITH OPEN ROTATOR CUFF REPAIR Right 05/25/2016  Procedure: SHOULDER ARTHROSCOPY WITH OPEN ROTATOR CUFF REPAIR;  Surgeon: Leanor Kail, MD;  Location: [facility] ORS;  Service: Orthopedics;  Laterality: Right;  . SUBACROMIAL DECOMPRESSION Right 05/25/2016   Procedure: SUBACROMIAL DECOMPRESSION;  Surgeon: Leanor Kail, MD;  Location: [facility] ORS;  Service: Orthopedics;  Laterality: Right;    Family History  Problem Relation Age of Onset  . Breast cancer Paternal Aunt 24  . Diabetes Father   . Heart disease Father     Social History:  reports that she quit  smoking about 13 years ago. She has a 40.00 pack-year smoking history. She has never used smokeless tobacco. She reports that she does not drink alcohol or use drugs.  Patient moved to [place] in 2001 from [place]. Patient is a former 1 ppd smoker x 40 years; quit in 2006. She is retired from [employer]. Patient denies known exposures to radiation on toxins.  Her significant other is [employer].  The patient is alone today.  Allergies:  Allergies  Allergen Reactions  . Iodinated Diagnostic Agents Anaphylaxis    Other reaction(s): Other (See Comments) Throat swells and extreme hives  . Latex Itching  . Phenobarbital Hives  . Tape Rash    silicones    Current Medications: Current Outpatient Medications  Medication Sig Dispense Refill  . aspirin 81 MG tablet Take 81 mg by mouth daily.    Marland Kitchen atorvastatin (LIPITOR) 40 MG tablet Take 40 mg by mouth daily.    . B Complex Vitamins (VITAMIN B COMPLEX PO) Take 1 tablet by mouth daily.    . Calcium Carbonate-Vitamin D 600-200 MG-UNIT CAPS Take 1 capsule by mouth daily.    . cloNIDine (CATAPRES) 0.2 MG tablet Take 0.2 mg by mouth 2 (two) times daily.    . diphenhydrAMINE (BENADRYL) 25 mg capsule Take 25 mg by mouth daily.    Marland Kitchen EPINEPHrine (EPIPEN 2-PAK) 0.3 mg/0.3 mL IJ SOAJ injection 0.3 mg once. Reported on 05/25/2016    . fluticasone (FLONASE) 50 MCG/ACT nasal spray Place 2 sprays into both nostrils daily. 16 g 2  . Fluticasone-Umeclidin-Vilant (TRELEGY ELLIPTA) 100-62.5-25 MCG/INH AEPB Inhale 1 puff into the lungs daily. 1 each 4  . furosemide (LASIX) 20 MG tablet Take 1 tablet (20 mg total) by mouth 2 (two) times daily. 60 tablet 0  . hydrALAZINE (APRESOLINE) 100 MG tablet Take 50 mg by mouth 3 (three) times daily.     . hydroxyurea (HYDREA) 500 MG capsule Take 1 capsule (500 mg total) by mouth daily. May take with food to minimize GI side effects. 90 capsule 0  . levETIRAcetam (KEPPRA) 750 MG tablet Take 750 mg by mouth 2 (two) times daily.    .  metFORMIN (GLUCOPHAGE) 500 MG tablet Take 500 mg by mouth 2 (two) times daily with a meal.    . metoprolol succinate (TOPROL-XL) 100 MG 24 hr tablet Take 100 mg by mouth daily.    . montelukast (SINGULAIR) 10 MG tablet TAKE 1 TABLET BY MOUTH EVERY DAY 90 tablet 0  . Multiple Vitamin (MULTI-VITAMINS) TABS Take 1 tablet by mouth daily.    Marland Kitchen NIFEdipine (NIFEDICAL XL) 30 MG 24 hr tablet Take 30 mg by mouth daily.    Marland Kitchen OVER THE COUNTER MEDICATION Place 1 drop into both eyes daily. Allergy eye drops    . sitaGLIPtin (JANUVIA) 25 MG tablet Take 1 tablet by mouth daily.    . traZODone (DESYREL) 50 MG tablet Take 50-100 mg by mouth at bedtime as needed. Sleep  0  .  valsartan-hydrochlorothiazide (DIOVAN-HCT) 320-25 MG tablet Take 1 tablet by mouth daily.  0  . vitamin C (ASCORBIC ACID) 500 MG tablet Take 500 mg by mouth 2 (two) times daily.    Marland Kitchen albuterol (ACCUNEB) 1.25 MG/3ML nebulizer solution Take 1 ampule by nebulization every 6 (six) hours as needed. wheezing  3  . albuterol (PROVENTIL HFA;VENTOLIN HFA) 108 (90 Base) MCG/ACT inhaler Inhale 2 puffs into the lungs every 6 (six) hours as needed. (Patient not taking: Reported on 01/17/2019) 3 Inhaler 2  . glucose blood (ONE TOUCH ULTRA TEST) test strip USE ONE STRIP TO CHECK GLUCOSE ONCE DAILY    . ondansetron (ZOFRAN) 8 MG tablet Take 1 tablet (8 mg total) by mouth every 8 (eight) hours as needed for nausea or vomiting. (Patient not taking: Reported on 11/21/2018) 20 tablet 1   No current facility-administered medications for this visit.     Review of Systems  Constitutional: Positive for malaise/fatigue and weight loss (1 pound). Negative for diaphoresis and fever.  HENT: Negative for congestion, sinus pain and sore throat.        (+) rhinorrhea  Eyes: Negative.        Macular degeneration.  Respiratory: Negative for cough, hemoptysis, sputum production and shortness of breath.        PMH (+) for OSAH syndrome - uses nocturnal PAP therapy.  (+) COPD   Cardiovascular: Positive for orthopnea (chronic). Negative for chest pain, palpitations, leg swelling and PND.  Gastrointestinal: Negative for abdominal pain, blood in stool, constipation, diarrhea (resolved; stools fully formed), melena, nausea and vomiting.  Genitourinary: Negative for dysuria, frequency, hematuria and urgency.  Musculoskeletal: Positive for back pain (DDD) and joint pain (OA). Negative for falls and myalgias.  Skin: Negative for itching and rash.  Neurological: Negative for dizziness, tremors, weakness and headaches.  Endo/Heme/Allergies: Positive for environmental allergies (season; recieves monthly allergy injections). Does not bruise/bleed easily.  Psychiatric/Behavioral: Negative for depression, memory loss and suicidal ideas. The patient is not nervous/anxious and does not have insomnia.   All other systems reviewed and are negative.  Performance status (ECOG): 1 - Symptomatic but completely ambulatory  Vital Signs: BP 123/76 (BP Location: Right Arm, Patient Position: Sitting)   Pulse 73   Temp 97.7 F (36.5 C) (Tympanic)   Resp 18   Ht 5\' 7"  (1.702 m)   Wt 218 lb 12.9 oz (99.2 kg)   SpO2 96%   BMI 34.27 kg/m   Physical Exam  Constitutional: She is oriented to person, place, and time and well-developed, well-nourished, and in no distress.  HENT:  Head: Normocephalic and atraumatic.  Mouth/Throat: Oropharynx is clear and moist and mucous membranes are normal. She has dentures.  Eyes: Pupils are equal, round, and reactive to light. EOM are normal. No scleral icterus.  Neck: Normal range of motion. Neck supple. No tracheal deviation present. No thyromegaly present.  Cardiovascular: Normal rate, regular rhythm, normal heart sounds and intact distal pulses. Exam reveals no gallop and no friction rub.  No murmur heard. Pulmonary/Chest: Effort normal and breath sounds normal. No respiratory distress. She has no wheezes. She has no rales.  Abdominal: Soft. Bowel  sounds are normal. She exhibits no distension. There is no abdominal tenderness.  Musculoskeletal: Normal range of motion.        General: No tenderness or edema.  Neurological: She is alert and oriented to person, place, and time.  Skin: Skin is warm and dry. No rash noted. No erythema.  Psychiatric: Mood, affect and judgment  normal.  Nursing note and vitals reviewed.   Appointment on 01/17/2019  Component Date Value Ref Range Status  . WBC 01/17/2019 12.2* 4.0 - 10.5 K/uL Final  . RBC 01/17/2019 4.65  3.87 - 5.11 MIL/uL Final  . Hemoglobin 01/17/2019 15.6* 12.0 - 15.0 g/dL Final  . HCT 01/17/2019 44.1  36.0 - 46.0 % Final  . MCV 01/17/2019 94.8  80.0 - 100.0 fL Final  . MCH 01/17/2019 33.5  26.0 - 34.0 pg Final  . MCHC 01/17/2019 35.4  30.0 - 36.0 g/dL Final  . RDW 01/17/2019 14.3  11.5 - 15.5 % Final  . Platelets 01/17/2019 364  150 - 400 K/uL Final  . nRBC 01/17/2019 0.0  0.0 - 0.2 % Final  . Neutrophils Relative % 01/17/2019 79  % Final  . Neutro Abs 01/17/2019 9.7* 1.7 - 7.7 K/uL Final  . Lymphocytes Relative 01/17/2019 7  % Final  . Lymphs Abs 01/17/2019 0.8  0.7 - 4.0 K/uL Final  . Monocytes Relative 01/17/2019 10  % Final  . Monocytes Absolute 01/17/2019 1.2* 0.1 - 1.0 K/uL Final  . Eosinophils Relative 01/17/2019 3  % Final  . Eosinophils Absolute 01/17/2019 0.3  0.0 - 0.5 K/uL Final  . Basophils Relative 01/17/2019 1  % Final  . Basophils Absolute 01/17/2019 0.1  0.0 - 0.1 K/uL Final  . Immature Granulocytes 01/17/2019 0  % Final  . Abs Immature Granulocytes 01/17/2019 0.05  0.00 - 0.07 K/uL Final   Performed at [facility], [address], [place], [place] [address]  . Sodium 01/17/2019 138  135 - 145 mmol/L Final  . Potassium 01/17/2019 3.4* 3.5 - 5.1 mmol/L Final  . Chloride 01/17/2019 102  98 - 111 mmol/L Final  . CO2 01/17/2019 27  22 - 32 mmol/L Final  . Glucose, Bld 01/17/2019 107* 70 - 99 mg/dL Final  . BUN 01/17/2019 18  8 - 23 mg/dL Final  .  Creatinine, Ser 01/17/2019 0.88  0.44 - 1.00 mg/dL Final  . Calcium 01/17/2019 9.8  8.9 - 10.3 mg/dL Final  . Total Protein 01/17/2019 6.7  6.5 - 8.1 g/dL Final  . Albumin 01/17/2019 4.2  3.5 - 5.0 g/dL Final  . AST 01/17/2019 30  15 - 41 U/L Final  . ALT 01/17/2019 29  0 - 44 U/L Final  . Alkaline Phosphatase 01/17/2019 75  38 - 126 U/L Final  . Total Bilirubin 01/17/2019 1.0  0.3 - 1.2 mg/dL Final  . GFR calc non Af Amer 01/17/2019 >60  >60 mL/min Final  . GFR calc Af Amer 01/17/2019 >60  >60 mL/min Final  . Anion gap 01/17/2019 9  5 - 15 Final   Performed at [facility], [address], [place], [place] [address]    Assessment:  PASCUALA KLUTTS is a 78 y.o. female with polycythemia rubra vera.  She has had sleep apnea x 7 years and uses CPAP.  She has a 40 pack year smoking history (stopped 13 years ago).  She denies and cardiac history.  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  There was no significant dysplasia or increased blasts.  Overall features were c/w polycythemia rubra vera.  Work up on 08/08/2018 revealed a WBC of 16,700 ([place] 13,800). Hemoglobin 15.3, hematocrit 45.5, MCV 87.9, and platelets 552,000. Erythropoietin level was normal at 2.9 mIU/mL. BCR/ABL demonstrated no BCR or ABL gene rearrangements. Carbon monoxide level was normal  at 3.0% (0-3.6 %).  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   She began a phlebotomy program on 09/07/2018 (last 10/15/2018).  Hematocrit goal is <=42.  She was diagnosed with C difficile + diarrhea on 10/10/2018; completed 10 day course of oral vancomycin.  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  Symptomatically, patient is doing well.  She denies any acute concerns.  Patient complains of rhinorrhea associated with her allergies.  She receives monthly allergy injections.  No bruising or bleeding.  No B  symptoms or recent infections.  Patient complains of orthopnea and shortness of breath.  She is followed by pulmonary medicine.  No recurrent diarrhea.  Exam is grossly unremarkable.  WBC 12,200 ([place] 9700).  Hemoglobin 15.6, hematocrit 44.1, MCV 94.8, and platelets 364,000.  Potassium low at 3.4 mmol/L.   Plan: 1. Labs today:  CBC with diff, CMP. 2. Jak 2+ MPN (polycythemia rubra vera)  Labs reviewed.  Hematocrit 44.1, hemoglobin 15.6, and platelets 364,000.  Review goal hematocrit </= 42 and goal platelet count of < 400,000.  Will require small-volume (300 cc) therapeutic phlebotomy today.  Continues on hydroxyurea 500 mg daily.  Consider increasing hydroxyurea by 1 pill/week (14%)  Counts stable today; no changes.  3. CDI with (+) CDAD  No diarrhea recurrence following pulse dose oral vancomycin taper.  Stools are formed.  No mucus/blood.  No abdominal pain.  Patient to contact the clinic should diarrhea recur. 4. Seasonal allergies  Patient states, "I am allergic to everything in [place]".  Followed by pulmonary medicine Humphrey Rolls, MD) and receives monthly allergy injections.  Continue prescribed interventions as recommended by pulmonary medicine. 5. RTC every 2 weeks for labs (CBC) +/- phlebotomy. 6. RTC in 4 weeks for MD assessment, labs (CBC with diff, CMP), and +/- phlebotomy.    Honor Loh, NP  01/17/2019, 7:32 PM

## 2019-01-24 ENCOUNTER — Ambulatory Visit (INDEPENDENT_AMBULATORY_CARE_PROVIDER_SITE_OTHER): Payer: Medicare Other | Admitting: Internal Medicine

## 2019-01-24 ENCOUNTER — Encounter: Payer: Self-pay | Admitting: Internal Medicine

## 2019-01-24 VITALS — BP 108/68 | HR 60 | Resp 16 | Ht 68.0 in | Wt 218.0 lb

## 2019-01-24 DIAGNOSIS — R0602 Shortness of breath: Secondary | ICD-10-CM

## 2019-01-24 DIAGNOSIS — J449 Chronic obstructive pulmonary disease, unspecified: Secondary | ICD-10-CM

## 2019-01-24 DIAGNOSIS — J301 Allergic rhinitis due to pollen: Secondary | ICD-10-CM | POA: Diagnosis not present

## 2019-01-24 DIAGNOSIS — G4733 Obstructive sleep apnea (adult) (pediatric): Secondary | ICD-10-CM

## 2019-01-24 DIAGNOSIS — Z9989 Dependence on other enabling machines and devices: Secondary | ICD-10-CM | POA: Diagnosis not present

## 2019-01-24 MED ORDER — EPINEPHRINE 0.3 MG/0.3ML IJ SOAJ: 0.3000 mg | Freq: Once | INTRAMUSCULAR | 0 refills | Status: AC

## 2019-01-24 NOTE — Patient Instructions (Signed)

## 2019-01-24 NOTE — Progress Notes (Signed)
[facility] [place], [name] [address]  Pulmonary Sleep Medicine   Office Visit Note  Patient Name: Angelica Juarez DOB: Nov 23, 1940 MRN [id number]  Date of Service: 01/24/2019  Complaints/HPI: PT is here for 3 month follow up on osa,copd and allergies. Pt she reports she is doing well.  She is taking all her medications as directed.  She denies any hospitalizations.  No chest pain, sob or other issues.  She is using her CPAP nightly with no complaints.  She is cleaning her machine and replacing the the mask and tubing as scheduled.        ROS  General: (-) fever, (-) chills, (-) night sweats, (-) weakness Skin: (-) rashes, (-) itching,. Eyes: (-) visual changes, (-) redness, (-) itching. Nose and Sinuses: (-) nasal stuffiness or itchiness, (-) postnasal drip, (-) nosebleeds, (-) sinus trouble. Mouth and Throat: (-) sore throat, (-) hoarseness. Neck: (-) swollen glands, (-) enlarged thyroid, (-) neck pain. Respiratory: - cough, (-) bloody sputum, - shortness of breath, - wheezing. Cardiovascular: - ankle swelling, (-) chest pain. Lymphatic: (-) lymph node enlargement. Neurologic: (-) numbness, (-) tingling. Psychiatric: (-) anxiety, (-) depression   Current Medication: Outpatient Encounter Medications as of 01/24/2019  Medication Sig Note  . albuterol (ACCUNEB) 1.25 MG/3ML nebulizer solution Take 1 ampule by nebulization every 6 (six) hours as needed. wheezing   . albuterol (PROVENTIL HFA;VENTOLIN HFA) 108 (90 Base) MCG/ACT inhaler Inhale 2 puffs into the lungs every 6 (six) hours as needed.   Marland Kitchen aspirin 81 MG tablet Take 81 mg by mouth daily.   Marland Kitchen atorvastatin (LIPITOR) 40 MG tablet Take 40 mg by mouth daily.   . B Complex Vitamins (VITAMIN B COMPLEX PO) Take 1 tablet by mouth daily.   . Calcium Carbonate-Vitamin D 600-200 MG-UNIT CAPS Take 1 capsule by mouth daily.   . cloNIDine (CATAPRES) 0.2 MG tablet Take 0.2 mg by mouth 2 (two) times daily.   .  diphenhydrAMINE (BENADRYL) 25 mg capsule Take 25 mg by mouth daily.   Marland Kitchen EPINEPHrine (EPIPEN 2-PAK) 0.3 mg/0.3 mL IJ SOAJ injection 0.3 mg once. Reported on 05/25/2016   . fluticasone (FLONASE) 50 MCG/ACT nasal spray Place 2 sprays into both nostrils daily.   . Fluticasone-Umeclidin-Vilant (TRELEGY ELLIPTA) 100-62.5-25 MCG/INH AEPB Inhale 1 puff into the lungs daily.   . furosemide (LASIX) 20 MG tablet Take 1 tablet (20 mg total) by mouth 2 (two) times daily.   Marland Kitchen glucose blood (ONE TOUCH ULTRA TEST) test strip USE ONE STRIP TO CHECK GLUCOSE ONCE DAILY   . hydrALAZINE (APRESOLINE) 100 MG tablet Take 50 mg by mouth 3 (three) times daily.    . hydroxyurea (HYDREA) 500 MG capsule Take 1 capsule (500 mg total) by mouth daily. May take with food to minimize GI side effects.   . levETIRAcetam (KEPPRA) 750 MG tablet Take 750 mg by mouth 2 (two) times daily.   . metFORMIN (GLUCOPHAGE) 500 MG tablet Take 500 mg by mouth 2 (two) times daily with a meal.   . metoprolol succinate (TOPROL-XL) 100 MG 24 hr tablet Take 100 mg by mouth daily.   . montelukast (SINGULAIR) 10 MG tablet TAKE 1 TABLET BY MOUTH EVERY DAY   . Multiple Vitamin (MULTI-VITAMINS) TABS Take 1 tablet by mouth daily.   Marland Kitchen NIFEdipine (NIFEDICAL XL) 30 MG 24 hr tablet Take 30 mg by mouth daily.   Marland Kitchen OVER THE COUNTER MEDICATION Place 1 drop into both eyes daily. Allergy eye drops   .  sitaGLIPtin (JANUVIA) 25 MG tablet Take 1 tablet by mouth daily.   . traZODone (DESYREL) 50 MG tablet Take 50-100 mg by mouth at bedtime as needed. Sleep 09/12/2018: Now taking 3 at bedtime and if she awakens she can take 1 more.  . valsartan-hydrochlorothiazide (DIOVAN-HCT) 320-25 MG tablet Take 1 tablet by mouth daily.   . vitamin C (ASCORBIC ACID) 500 MG tablet Take 500 mg by mouth 2 (two) times daily.   . ondansetron (ZOFRAN) 8 MG tablet Take 1 tablet (8 mg total) by mouth every 8 (eight) hours as needed for nausea or vomiting. (Patient not taking: Reported on  11/21/2018)    No facility-administered encounter medications on file as of 01/24/2019.     Surgical History: Past Surgical History:  Procedure Laterality Date  . ABDOMINAL HYSTERECTOMY    . 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 EYE SURGERY     bilateral cataracts  . JOINT REPLACEMENT     bilateral knee replacement  . NASAL SEPTUM SURGERY    . REPLACEMENT TOTAL KNEE Bilateral 2004  . SHOULDER ARTHROSCOPY WITH BICEPSTENOTOMY Right 05/25/2016   Procedure: SHOULDER ARTHROSCOPY WITH BICEPSTENOTOMY;  Surgeon: Leanor Kail, MD;  Location: [facility] ORS;  Service: Orthopedics;  Laterality: Right;  . SHOULDER ARTHROSCOPY WITH DISTAL CLAVICLE RESECTION Right 05/25/2016   Procedure: SHOULDER ARTHROSCOPY WITH DISTAL CLAVICLE RESECTION;  Surgeon: Leanor Kail, MD;  Location: [facility] OR[facility];  Service: Orthopedics;  Laterality: Right;  . SHOULDER ARTHROSCOPY WITH OPEN ROTATOR CUFF REPAIR Right 05/25/2016   Procedure: SHOULDER ARTHROSCOPY WITH OPEN ROTATOR CUFF REPAIR;  Surgeon: Leanor Kail, MD;  Location: [facility] OR[facility];  Service: Orthopedics;  Laterality: Right;  . SUBACROMIAL DECOMPRESSION Right 05/25/2016   Procedure: SUBACROMIAL DECOMPRESSION;  Surgeon: Leanor Kail, MD;  Location: [facility] ORS;  Service: Orthopedics;  Laterality: Right;    Medical History: Past Medical History:  Diagnosis Date  . Allergy    Seasonal  . Arthritis   . 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 Chronic kidney disease    chronic renal insufficiency  . Colon polyps 05/21/14  . COPD (chronic obstructive pulmonary disease) ([place])   . Diabetes mellitus without complication ([place])   . Dyspnea   . Fracture closed, humerus, shaft    right   . History of kidney stones    40 years ago  . Hyperlipidemia 04/30/14  . Hypertension   . Impingement syndrome of right shoulder 12/29/15  . Personal history of radiation therapy   . Pneumonia    hx  . Rotator cuff tear 04/21/16   right  . Seizures ([place])   . Sleep  apnea     Family History: Family History  Problem Relation Age of Onset  . Breast cancer Paternal Aunt 19  . Diabetes Father   . Heart disease Father     Social History: Social History   Socioeconomic History  . Marital status: Single    Spouse name: Not on file  . Number of children: Not on file  . Years of education: Not on file  . Highest education level: Not on file  Occupational History  . Not on file  Social Needs  . Financial resource strain: Not on file  . Food insecurity:    Worry: Not on file    Inability: Not on file  . Transportation needs:    Medical: Not on file    Non-medical: Not on file  Tobacco Use  . Smoking status: Former Smoker  Packs/day: 1.00    Years: 40.00    Pack years: 40.00    Last attempt to quit: 05/12/2005    Years since quitting: 13.7  . Smokeless tobacco: Never Used  Substance and Sexual Activity  . Alcohol use: No  . Drug use: No  . Sexual activity: Not on file  Lifestyle  . Physical activity:    Days per week: Not on file    Minutes per session: Not on file  . Stress: Not on file  Relationships  . Social connections:    Talks on phone: Not on file    Gets together: Not on file    Attends religious service: Not on file    Active member of club or organization: Not on file    Attends meetings of clubs or organizations: Not on file    Relationship status: Not on file  . Intimate partner violence:    Fear of current or ex partner: Not on file    Emotionally abused: Not on file    Physically abused: Not on file    Forced sexual activity: Not on file  Other Topics Concern  . Not on file  Social History Narrative  . Not on file    Vital Signs: Blood pressure 108/68, pulse 60, resp. rate 16, height 5' 8"  (1.727 m), weight 218 lb (98.9 kg), SpO2 93 %.  Examination: General Appearance: The patient is well-developed, well-nourished, and in no distress. Skin: Gross inspection of skin unremarkable. Head: normocephalic, no  gross deformities. Eyes: no gross deformities noted. ENT: ears appear grossly normal no exudates. Neck: Supple. No thyromegaly. No LAD. Respiratory: clear bilateraly. Cardiovascular: Normal S1 and S2 without murmur or rub. Extremities: No cyanosis. pulses are equal. Neurologic: Alert and oriented. No involuntary movements.  LABS: Recent Results (from the past 2160 hour(s))  Comprehensive metabolic panel     Status: Abnormal   Collection Time: 11/21/18  1:28 PM  Result Value Ref Range   Sodium 138 135 - 145 mmol/L   Potassium 3.8 3.5 - 5.1 mmol/L   Chloride 100 98 - 111 mmol/L   CO2 28 22 - 32 mmol/L   Glucose, Bld 129 (H) 70 - 99 mg/dL   BUN 27 (H) 8 - 23 mg/dL   Creatinine, Ser 1.06 (H) 0.44 - 1.00 mg/dL   Calcium 10.0 8.9 - 10.3 mg/dL   Total Protein 6.7 6.5 - 8.1 g/dL   Albumin 4.1 3.5 - 5.0 g/dL   AST 32 15 - 41 U/L   ALT 31 0 - 44 U/L   Alkaline Phosphatase 77 38 - 126 U/L   Total Bilirubin 0.8 0.3 - 1.2 mg/dL   GFR calc non Af Amer 51 (L) >60 mL/min   GFR calc Af Amer 59 (L) >60 mL/min   Anion gap 10 5 - 15    Comment: Performed at [facility], [address], [place], [place] [address]  CBC with Differential     Status: Abnormal   Collection Time: 11/21/18  1:28 PM  Result Value Ref Range   WBC 16.1 (H) 4.0 - 10.5 K/uL   RBC 4.99 3.87 - 5.11 MIL/uL   Hemoglobin 15.7 (H) 12.0 - 15.0 g/dL   HCT 45.5 36.0 - 46.0 %   MCV 91.2 80.0 - 100.0 fL   MCH 31.5 26.0 - 34.0 pg   MCHC 34.5 30.0 - 36.0 g/dL   RDW 15.3 11.5 - 15.5 %   Platelets 389 150 - 400 K/uL  nRBC 0.0 0.0 - 0.2 %   Neutrophils Relative % 87 %   Neutro Abs 14.0 (H) 1.7 - 7.7 K/uL   Lymphocytes Relative 3 %   Lymphs Abs 0.6 (L) 0.7 - 4.0 K/uL   Monocytes Relative 6 %   Monocytes Absolute 1.0 0.1 - 1.0 K/uL   Eosinophils Relative 2 %   Eosinophils Absolute 0.3 0.0 - 0.5 K/uL   Basophils Relative 1 %   Basophils Absolute 0.2 (H) 0.0 - 0.1 K/uL   Immature Granulocytes 1 %   Abs Immature  Granulocytes 0.09 (H) 0.00 - 0.07 K/uL    Comment: Performed at [facility], [address], [place], [place] [address]  Gastrointestinal Panel by PCR , Stool     Status: None   Collection Time: 11/23/18  2:20 PM  Result Value Ref Range   Campylobacter species NOT DETECTED NOT DETECTED   Plesimonas shigelloides NOT DETECTED NOT DETECTED   Salmonella species NOT DETECTED NOT DETECTED   Yersinia enterocolitica NOT DETECTED NOT DETECTED   Vibrio species NOT DETECTED NOT DETECTED   Vibrio cholerae NOT DETECTED NOT DETECTED   Enteroaggregative E coli (EAEC) NOT DETECTED NOT DETECTED   Enteropathogenic E coli (EPEC) NOT DETECTED NOT DETECTED   Enterotoxigenic E coli (ETEC) NOT DETECTED NOT DETECTED   Shiga like toxin producing E coli (STEC) NOT DETECTED NOT DETECTED   Shigella/Enteroinvasive E coli (EIEC) NOT DETECTED NOT DETECTED   Cryptosporidium NOT DETECTED NOT DETECTED   Cyclospora cayetanensis NOT DETECTED NOT DETECTED   Entamoeba histolytica NOT DETECTED NOT DETECTED   Giardia lamblia NOT DETECTED NOT DETECTED   Adenovirus F40/41 NOT DETECTED NOT DETECTED   Astrovirus NOT DETECTED NOT DETECTED   Norovirus GI/GII NOT DETECTED NOT DETECTED   Rotavirus A NOT DETECTED NOT DETECTED   Sapovirus (I, II, IV, and V) NOT DETECTED NOT DETECTED    Comment: Performed at [facility], [address] [place], [place] [address]  C Difficile Quick Screen w PCR reflex     Status: Abnormal   Collection Time: 11/23/18  2:20 PM  Result Value Ref Range   C Diff antigen POSITIVE (A) NEGATIVE   C Diff toxin NEGATIVE NEGATIVE   C Diff interpretation Results are indeterminate. See PCR results.     Comment: Performed at [facility], [address], [place], [place] [address]  C. Diff by PCR, Reflexed     Status: Abnormal   Collection Time: 11/23/18  2:20 PM  Result Value Ref Range   Toxigenic C. Difficile by PCR POSITIVE (A) NEGATIVE    Comment: Positive for  toxigenic C. difficile with little to no toxin production. Only treat if clinical presentation suggests symptomatic illness. Performed at [facility], [address] [place],  [address]   CBC with Differential     Status: Abnormal   Collection Time: 12/12/18  2:44 PM  Result Value Ref Range   WBC 12.1 (H) 4.0 - 10.5 K/uL   RBC 4.68 3.87 - 5.11 MIL/uL   Hemoglobin 15.1 (H) 12.0 - 15.0 g/dL   HCT 43.6 36.0 - 46.0 %   MCV 93.2 80.0 - 100.0 fL   MCH 32.3 26.0 - 34.0 pg   MCHC 34.6 30.0 - 36.0 g/dL   RDW 15.1 11.5 - 15.5 %   Platelets 380 150 - 400 K/uL   nRBC 0.0 0.0 - 0.2 %   Neutrophils Relative % 79 %   Neutro Abs 9.5 (H) 1.7 -  7.7 K/uL   Lymphocytes Relative 7 %   Lymphs Abs 0.8 0.7 - 4.0 K/uL   Monocytes Relative 9 %   Monocytes Absolute 1.1 (H) 0.1 - 1.0 K/uL   Eosinophils Relative 4 %   Eosinophils Absolute 0.5 0.0 - 0.5 K/uL   Basophils Relative 1 %   Basophils Absolute 0.2 (H) 0.0 - 0.1 K/uL   Immature Granulocytes 0 %   Abs Immature Granulocytes 0.05 0.00 - 0.07 K/uL    Comment: Performed at [facility], [address], [place], [place] [address]  Ferritin     Status: None   Collection Time: 12/20/18  1:32 PM  Result Value Ref Range   Ferritin 32 11 - 307 ng/mL    Comment: Performed at [facility], [address] [place], [place] [address]  Comprehensive metabolic panel     Status: None   Collection Time: 12/20/18  1:42 PM  Result Value Ref Range   Sodium 135 135 - 145 mmol/L   Potassium 3.6 3.5 - 5.1 mmol/L   Chloride 100 98 - 111 mmol/L   CO2 25 22 - 32 mmol/L   Glucose, Bld 97 70 - 99 mg/dL   BUN 16 8 - 23 mg/dL   Creatinine, Ser 0.81 0.44 - 1.00 mg/dL   Calcium 9.5 8.9 - 10.3 mg/dL   Total Protein 6.7 6.5 - 8.1 g/dL   Albumin 4.2 3.5 - 5.0 g/dL   AST 32 15 - 41 U/L   ALT 33 0 - 44 U/L   Alkaline Phosphatase 69 38 - 126 U/L   Total Bilirubin 0.9 0.3 - 1.2 mg/dL   GFR calc non Af Amer >60 >60 mL/min   GFR calc Af  Amer >60 >60 mL/min   Anion gap 10 5 - 15    Comment: Performed at [facility], [address] [place], [place] [address]  CBC with Differential/Platelet     Status: Abnormal   Collection Time: 12/20/18  1:42 PM  Result Value Ref Range   WBC 12.2 (H) 4.0 - 10.5 K/uL   RBC 4.69 3.87 - 5.11 MIL/uL   Hemoglobin 15.3 (H) 12.0 - 15.0 g/dL   HCT 43.8 36.0 - 46.0 %   MCV 93.4 80.0 - 100.0 fL   MCH 32.6 26.0 - 34.0 pg   MCHC 34.9 30.0 - 36.0 g/dL   RDW 15.2 11.5 - 15.5 %   Platelets 389 150 - 400 K/uL   nRBC 0.0 0.0 - 0.2 %   Neutrophils Relative % 78 %   Neutro Abs 9.7 (H) 1.7 - 7.7 K/uL   Lymphocytes Relative 6 %   Lymphs Abs 0.7 0.7 - 4.0 K/uL   Monocytes Relative 10 %   Monocytes Absolute 1.2 (H) 0.1 - 1.0 K/uL   Eosinophils Relative 4 %   Eosinophils Absolute 0.4 0.0 - 0.5 K/uL   Basophils Relative 1 %   Basophils Absolute 0.2 (H) 0.0 - 0.1 K/uL   Immature Granulocytes 1 %   Abs Immature Granulocytes 0.07 0.00 - 0.07 K/uL    Comment: Performed at [facility], [address] [place], [place] [address]  CBC     Status: Abnormal   Collection Time: 01/03/19  1:03 PM  Result Value Ref Range   WBC 11.2 (H) 4.0 - 10.5 K/uL   RBC 4.23 3.87 - 5.11 MIL/uL   Hemoglobin 14.0 12.0 - 15.0 g/dL   HCT 40.2 36.0 - 46.0 %  MCV 95.0 80.0 - 100.0 fL   MCH 33.1 26.0 - 34.0 pg   MCHC 34.8 30.0 - 36.0 g/dL   RDW 14.9 11.5 - 15.5 %   Platelets 309 150 - 400 K/uL   nRBC 0.0 0.0 - 0.2 %    Comment: Performed at [facility], [address], [place], [place] [address]  CBC with Differential     Status: Abnormal   Collection Time: 01/17/19  1:08 PM  Result Value Ref Range   WBC 12.2 (H) 4.0 - 10.5 K/uL   RBC 4.65 3.87 - 5.11 MIL/uL   Hemoglobin 15.6 (H) 12.0 - 15.0 g/dL   HCT 44.1 36.0 - 46.0 %   MCV 94.8 80.0 - 100.0 fL   MCH 33.5 26.0 - 34.0 pg   MCHC 35.4 30.0 - 36.0 g/dL   RDW 14.3 11.5 - 15.5 %   Platelets 364 150 - 400 K/uL   nRBC 0.0 0.0 -  0.2 %   Neutrophils Relative % 79 %   Neutro Abs 9.7 (H) 1.7 - 7.7 K/uL   Lymphocytes Relative 7 %   Lymphs Abs 0.8 0.7 - 4.0 K/uL   Monocytes Relative 10 %   Monocytes Absolute 1.2 (H) 0.1 - 1.0 K/uL   Eosinophils Relative 3 %   Eosinophils Absolute 0.3 0.0 - 0.5 K/uL   Basophils Relative 1 %   Basophils Absolute 0.1 0.0 - 0.1 K/uL   Immature Granulocytes 0 %   Abs Immature Granulocytes 0.05 0.00 - 0.07 K/uL    Comment: Performed at [facility], [address], [place], [place] [address]  Comprehensive metabolic panel     Status: Abnormal   Collection Time: 01/17/19  1:08 PM  Result Value Ref Range   Sodium 138 135 - 145 mmol/L   Potassium 3.4 (L) 3.5 - 5.1 mmol/L   Chloride 102 98 - 111 mmol/L   CO2 27 22 - 32 mmol/L   Glucose, Bld 107 (H) 70 - 99 mg/dL   BUN 18 8 - 23 mg/dL   Creatinine, Ser 0.88 0.44 - 1.00 mg/dL   Calcium 9.8 8.9 - 10.3 mg/dL   Total Protein 6.7 6.5 - 8.1 g/dL   Albumin 4.2 3.5 - 5.0 g/dL   AST 30 15 - 41 U/L   ALT 29 0 - 44 U/L   Alkaline Phosphatase 75 38 - 126 U/L   Total Bilirubin 1.0 0.3 - 1.2 mg/dL   GFR calc non Af Amer >60 >60 mL/min   GFR calc Af Amer >60 >60 mL/min   Anion gap 9 5 - 15    Comment: Performed at [facility], [address] [place], [place] [address]    Radiology: Ct Bone Marrow Biopsy & Aspiration  Result Date: 09/03/2018 INDICATION: History of polycythemia Vera. Please perform CT-guided bone marrow biopsy for tissue diagnostic purposes. EXAM: CT-GUIDED BONE MARROW BIOPSY AND ASPIRATION MEDICATIONS: None ANESTHESIA/SEDATION: Fentanyl 50 mcg IV; Versed 2 mg IV Sedation Time: 19 Minutes; The patient was continuously monitored during the procedure by the interventional radiology nurse under my direct supervision. COMPLICATIONS: None immediate. PROCEDURE: Informed consent was obtained from the patient following an explanation of the procedure, risks, benefits and alternatives. The patient understands,  agrees and consents for the procedure. All questions were addressed. A time out was performed prior to the initiation of the procedure. The patient was positioned left lateral decubitus and non-contrast localization CT was performed of the pelvis to demonstrate the  iliac marrow spaces. The operative site was prepped and draped in the usual sterile fashion. Under sterile conditions and local anesthesia, a 22 gauge spinal needle was utilized for procedural planning. Next, an 11 gauge coaxial bone biopsy needle was advanced into the left iliac marrow space. Needle position was confirmed with CT imaging. Initially, bone marrow aspiration was performed. Next, a bone marrow biopsy was obtained with the 11 gauge outer bone marrow device. Samples were prepared with the cytotechnologist and deemed adequate. The needle was removed intact. Hemostasis was obtained with compression and a dressing was placed. The patient tolerated the procedure well without immediate post procedural complication. IMPRESSION: Successful CT guided left iliac bone marrow aspiration and core biopsy. Electronically Signed   By: Sandi Mariscal M.D.   On: 09/03/2018 10:16    No results found.  No results found.    Assessment and Plan: Patient Active Problem List   Diagnosis Date Noted  . C. difficile diarrhea 11/24/2018  . Diarrhea 11/18/2018  . Nausea without vomiting 11/18/2018  . Goals of care, counseling/discussion 09/12/2018  . 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 History of repair of right rotator cuff 08/25/2016  . Acute respiratory failure with hypoxia ([place]) 08/06/2016  . Nontraumatic tear of right rotator cuff 04/21/2016  . Atypical chest pain 04/07/2016  . Impingement syndrome of right shoulder 12/29/2015  . Primary osteoarthritis of first carpometacarpal joint of left hand 10/27/2015  . Right shoulder pain 10/27/2015  . OSA on CPAP 10/14/2015  . 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 Type 2 diabetes mellitus with  microalbuminuria, without long-term current use of insulin ([place]) 07/13/2015  . Essential hypertension 03/05/2015  . Difficulty sleeping 11/26/2014  . CRI (chronic renal insufficiency) 07/31/2014  . Seizure disorder ([place]) 07/31/2014  . History of colonic polyps 05/21/2014  . Hyperlipidemia, unspecified 04/30/2014   1. OSA on CPAP Patient will continue to wear CPAP as directed.  2. Chronic obstructive pulmonary disease, unspecified COPD type ([place]) Stable continue to use inhalers and medications as needed.  3. Allergic rhinitis due to pollen, unspecified seasonality Stable continue OTC medications.  4. SOB (shortness of breath) FVC is 2.2 which is 65% of the pre-predicted value FEV1 is 1.5 which is 60% of the pre-predicted value FEV1/FVC is 70% which is 93% of the predicted value on today spirometry. - Spirometry with Graph   General Counseling: I have discussed the findings of the evaluation and examination with Arbie Cookey.  I have also discussed any further diagnostic evaluation thatmay be needed or ordered today. Tahra verbalizes understanding of the findings of todays visit. We also reviewed her medications today and discussed drug interactions and side effects including but not limited excessive drowsiness and altered mental states. We also discussed that there is always a risk not just to her but also people around her. she has been encouraged to call the office with any questions or concerns that should arise related to todays visit.    Time spent: 25 This patient was seen by Orson Gear AGNP-C in Collaboration with Dr. Devona Konig as a part of collaborative care agreement.   I have personally obtained a history, examined the patient, evaluated laboratory and imaging results, formulated the assessment and plan and placed orders.    Allyne Gee, MD [facility] Pulmonary and Critical Care Sleep medicine

## 2019-01-27 NOTE — Addendum Note (Signed)
Addended by: Devona Konig on: 01/27/2019 08:55 PM   Modules accepted: Level of Service

## 2019-01-31 ENCOUNTER — Inpatient Hospital Stay: Payer: Medicare Other

## 2019-01-31 ENCOUNTER — Other Ambulatory Visit: Payer: Self-pay

## 2019-01-31 DIAGNOSIS — D45 Polycythemia vera: Secondary | ICD-10-CM | POA: Diagnosis not present

## 2019-01-31 LAB — CBC WITH DIFFERENTIAL/PLATELET
Abs Immature Granulocytes: 0.04 10*3/uL (ref 0.00–0.07)
Basophils Absolute: 0.2 10*3/uL — ABNORMAL HIGH (ref 0.0–0.1)
Basophils Relative: 1 %
Eosinophils Absolute: 0.4 10*3/uL (ref 0.0–0.5)
Eosinophils Relative: 3 %
HCT: 41.8 % (ref 36.0–46.0)
Hemoglobin: 14.8 g/dL (ref 12.0–15.0)
Immature Granulocytes: 0 %
Lymphocytes Relative: 7 %
Lymphs Abs: 0.8 10*3/uL (ref 0.7–4.0)
MCH: 34 pg (ref 26.0–34.0)
MCHC: 35.4 g/dL (ref 30.0–36.0)
MCV: 96.1 fL (ref 80.0–100.0)
Monocytes Absolute: 1.1 10*3/uL — ABNORMAL HIGH (ref 0.1–1.0)
Monocytes Relative: 9 %
Neutro Abs: 9.1 10*3/uL — ABNORMAL HIGH (ref 1.7–7.7)
Neutrophils Relative %: 80 %
Platelets: 343 10*3/uL (ref 150–400)
RBC: 4.35 MIL/uL (ref 3.87–5.11)
RDW: 13.7 % (ref 11.5–15.5)
WBC: 11.6 10*3/uL — ABNORMAL HIGH (ref 4.0–10.5)
nRBC: 0 % (ref 0.0–0.2)

## 2019-02-12 ENCOUNTER — Other Ambulatory Visit: Payer: Self-pay

## 2019-02-13 ENCOUNTER — Inpatient Hospital Stay: Payer: Medicare Other | Attending: Hematology and Oncology

## 2019-02-13 ENCOUNTER — Other Ambulatory Visit: Payer: Self-pay

## 2019-02-13 DIAGNOSIS — D45 Polycythemia vera: Secondary | ICD-10-CM | POA: Insufficient documentation

## 2019-02-13 LAB — CBC WITH DIFFERENTIAL/PLATELET
Abs Immature Granulocytes: 0.08 10*3/uL — ABNORMAL HIGH (ref 0.00–0.07)
Basophils Absolute: 0.1 10*3/uL (ref 0.0–0.1)
Basophils Relative: 1 %
Eosinophils Absolute: 0.3 10*3/uL (ref 0.0–0.5)
Eosinophils Relative: 3 %
HCT: 43.2 % (ref 36.0–46.0)
Hemoglobin: 14.9 g/dL (ref 12.0–15.0)
Immature Granulocytes: 1 %
Lymphocytes Relative: 6 %
Lymphs Abs: 0.8 10*3/uL (ref 0.7–4.0)
MCH: 33.2 pg (ref 26.0–34.0)
MCHC: 34.5 g/dL (ref 30.0–36.0)
MCV: 96.2 fL (ref 80.0–100.0)
Monocytes Absolute: 1.1 10*3/uL — ABNORMAL HIGH (ref 0.1–1.0)
Monocytes Relative: 9 %
Neutro Abs: 10.1 10*3/uL — ABNORMAL HIGH (ref 1.7–7.7)
Neutrophils Relative %: 80 %
Platelets: 412 10*3/uL — ABNORMAL HIGH (ref 150–400)
RBC: 4.49 MIL/uL (ref 3.87–5.11)
RDW: 13.3 % (ref 11.5–15.5)
WBC: 12.5 10*3/uL — ABNORMAL HIGH (ref 4.0–10.5)
nRBC: 0 % (ref 0.0–0.2)

## 2019-02-13 LAB — COMPREHENSIVE METABOLIC PANEL
ALT: 24 U/L (ref 0–44)
AST: 24 U/L (ref 15–41)
Albumin: 3.8 g/dL (ref 3.5–5.0)
Alkaline Phosphatase: 95 U/L (ref 38–126)
Anion gap: 10 (ref 5–15)
BUN: 21 mg/dL (ref 8–23)
CO2: 28 mmol/L (ref 22–32)
Calcium: 10 mg/dL (ref 8.9–10.3)
Chloride: 101 mmol/L (ref 98–111)
Creatinine, Ser: 0.89 mg/dL (ref 0.44–1.00)
GFR calc Af Amer: >60 mL/min (ref >60)
GFR calc non Af Amer: >60 mL/min (ref >60)
Glucose, Bld: 136 mg/dL — ABNORMAL HIGH (ref 70–99)
Potassium: 3.6 mmol/L (ref 3.5–5.1)
Sodium: 139 mmol/L (ref 135–145)
Total Bilirubin: 0.6 mg/dL (ref 0.3–1.2)
Total Protein: 6.8 g/dL (ref 6.5–8.1)

## 2019-02-14 ENCOUNTER — Encounter: Payer: Self-pay | Admitting: Hematology and Oncology

## 2019-02-14 ENCOUNTER — Other Ambulatory Visit: Payer: Medicare Other

## 2019-02-14 ENCOUNTER — Inpatient Hospital Stay (HOSPITAL_BASED_OUTPATIENT_CLINIC_OR_DEPARTMENT_OTHER): Payer: Medicare Other | Admitting: Hematology and Oncology

## 2019-02-14 VITALS — BP 150/75 | HR 63 | Temp 98.5°F | Resp 18

## 2019-02-14 DIAGNOSIS — D45 Polycythemia vera: Secondary | ICD-10-CM

## 2019-02-14 NOTE — Progress Notes (Signed)
[facility]  [address], [address] [place], [place] [address] Phone: [phone]  Fax: [phone]   Telephone Office Visit:  02/14/2019  I connected with Sander Nephew on 02/14/19 at 2:07 PM EDT by telephone and verified that I was speaking with the correct person using 2 identifiers.  The patient was at home.  I discussed the limitations, risk, security and privacy concerns of performing an evaluation and management service by telephone and  the availability of in person appointments.  I also discussed with the patient that there may be a patient responsible charge related to this service.  The patient expressed understanding and agreed to proceed.    Chief Complaint: Angelica Juarez is a 78 y.o. 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  HPI:  The patient was last seen in the hematology clinic on 01/17/2019 by Honor Loh, NP.  At that time, she was doing well.  She denied any acute concerns.  Patient complained of rhinorrhea associated with her allergies.  She received monthly allergy injections.  No bruising or bleeding.  No B symptoms or recent infections.  Patient complained of orthopnea and shortness of breath.  She is followed by pulmonary medicine.  No recurrent diarrhea.  Exam was grossly unremarkable.  WBC was 12,200 ([place] 9700).  Hemoglobin 15.6, hematocrit 44.1, MCV 94.8, and platelets 364,000.  Potassium was 3.4 mmol/L.  During the interim, she feels "alright; no complaints".  She finds it hard to sit in her house.  She denies any infections.  She notes allergies in the spring.  She is receiving allergy shots every 4 weeks.  Last blood phlebotomy was about 3 weeks ago.  She states that she only got half of a phlebotomy secondary to IV access issues.  She is on hydroxyurea 500 mg a day.   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   Chronic kidney disease    chronic renal insufficiency   Colon polyps 05/21/14   COPD (chronic obstructive pulmonary disease) (HCC)    Diabetes mellitus without complication ([place])    Dyspnea    Fracture closed, humerus, shaft    right    History of kidney stones    40 years ago   Hyperlipidemia 04/30/14   Hypertension    Impingement syndrome of right shoulder 12/29/15   Personal history of radiation therapy    Pneumonia    hx   Rotator cuff tear 04/21/16   right   Seizures ([place])    Sleep apnea     Past Surgical History:  Procedure Laterality Date   ABDOMINAL HYSTERECTOMY     EXPLORATORY LAPAROTOMY     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   EYE SURGERY     bilateral cataracts   JOINT REPLACEMENT     bilateral knee replacement   NASAL SEPTUM SURGERY     REPLACEMENT TOTAL KNEE Bilateral 2004   SHOULDER ARTHROSCOPY WITH BICEPSTENOTOMY Right 05/25/2016   Procedure: SHOULDER ARTHROSCOPY WITH BICEPSTENOTOMY;  Surgeon: Leanor Kail, MD;  Location: [facility] ORS;  Service: Orthopedics;  Laterality: Right;   SHOULDER ARTHROSCOPY WITH DISTAL CLAVICLE RESECTION Right 05/25/2016   Procedure: SHOULDER ARTHROSCOPY WITH DISTAL CLAVICLE RESECTION;  Surgeon: Leanor Kail, MD;  Location: [facility] OR[facility];  Service: Orthopedics;  Laterality: Right;   SHOULDER ARTHROSCOPY WITH OPEN ROTATOR CUFF REPAIR Right 05/25/2016  Procedure: SHOULDER ARTHROSCOPY WITH OPEN ROTATOR CUFF REPAIR;  Surgeon: Leanor Kail, MD;  Location: [facility] OR[facility];  Service: Orthopedics;  Laterality: Right;   SUBACROMIAL DECOMPRESSION Right 05/25/2016   Procedure: SUBACROMIAL DECOMPRESSION;  Surgeon: Leanor Kail, MD;  Location: [facility] OR[facility];  Service: Orthopedics;  Laterality: Right;    Family History  Problem Relation Age of Onset   Breast cancer Paternal Aunt 53   Diabetes Father    Heart disease Father     Social History:  reports that she quit smoking about 13 years ago. She has a 40.00 pack-year smoking  history. She has never used smokeless tobacco. She reports that she does not drink alcohol or use drugs.  Patient moved to [place] in 2001 from [place]. Patient is a former 1 ppd smoker x 40 years; quit in 2006. She is retired from [employer]. Patient denies known exposures to radiation on toxins.  Her significant other is [employer].  Participants in the patient's visit included the patient and Vito Berger, CMA, today.  The intake visit was provided by Vito Berger, CMA.   Allergies:  Allergies  Allergen Reactions   Iodinated Diagnostic Agents Anaphylaxis    Other reaction(s): Other (See Comments) Throat swells and extreme hives   Latex Itching   Phenobarbital Hives   Tape Rash    silicones    Current Medications: Current Outpatient Medications  Medication Sig Dispense Refill   aspirin 81 MG tablet Take 81 mg by mouth daily.     atorvastatin (LIPITOR) 40 MG tablet Take 40 mg by mouth daily.     B Complex Vitamins (VITAMIN B COMPLEX PO) Take 1 tablet by mouth daily.     Calcium Carbonate-Vitamin D 600-200 MG-UNIT CAPS Take 1 capsule by mouth daily.     cloNIDine (CATAPRES) 0.2 MG tablet Take 0.2 mg by mouth 2 (two) times daily.     diphenhydrAMINE (BENADRYL) 25 mg capsule Take 25 mg by mouth daily.     Fluticasone-Umeclidin-Vilant (TRELEGY ELLIPTA) 100-62.5-25 MCG/INH AEPB Inhale 1 puff into the lungs daily. 1 each 4   furosemide (LASIX) 20 MG tablet Take 1 tablet (20 mg total) by mouth 2 (two) times daily. 60 tablet 0   hydrALAZINE (APRESOLINE) 100 MG tablet Take 50 mg by mouth 3 (three) times daily.      hydroxyurea (HYDREA) 500 MG capsule Take 1 capsule (500 mg total) by mouth daily. May take with food to minimize GI side effects. 90 capsule 0   levETIRAcetam (KEPPRA) 750 MG tablet Take 750 mg by mouth 2 (two) times daily.     metFORMIN (GLUCOPHAGE) 500 MG tablet Take 500 mg by mouth 2 (two) times daily with a meal.     metoprolol succinate (TOPROL-XL) 100 MG 24  hr tablet Take 100 mg by mouth daily.     montelukast (SINGULAIR) 10 MG tablet TAKE 1 TABLET BY MOUTH EVERY DAY 90 tablet 0   Multiple Vitamin (MULTI-VITAMINS) TABS Take 1 tablet by mouth daily.     NIFEdipine (NIFEDICAL XL) 30 MG 24 hr tablet Take 30 mg by mouth daily.     OVER THE COUNTER MEDICATION Place 1 drop into both eyes daily. Allergy eye drops     sitaGLIPtin (JANUVIA) 25 MG tablet Take 1 tablet by mouth daily.     traZODone (DESYREL) 50 MG tablet Take 50-100 mg by mouth at bedtime as needed. Sleep  0   valsartan-hydrochlorothiazide (DIOVAN-HCT) 320-25 MG tablet Take 1 tablet by mouth daily.  0   vitamin C (  ASCORBIC ACID) 500 MG tablet Take 500 mg by mouth 2 (two) times daily.     albuterol (ACCUNEB) 1.25 MG/3ML nebulizer solution Take 1 ampule by nebulization every 6 (six) hours as needed. wheezing  3   albuterol (PROVENTIL HFA;VENTOLIN HFA) 108 (90 Base) MCG/ACT inhaler Inhale 2 puffs into the lungs every 6 (six) hours as needed. (Patient not taking: Reported on 02/14/2019) 3 Inhaler 2   fluticasone (FLONASE) 50 MCG/ACT nasal spray Place 2 sprays into both nostrils daily. (Patient not taking: Reported on 02/14/2019) 16 g 2   glucose blood (ONE TOUCH ULTRA TEST) test strip USE ONE STRIP TO CHECK GLUCOSE ONCE DAILY     ondansetron (ZOFRAN) 8 MG tablet Take 1 tablet (8 mg total) by mouth every 8 (eight) hours as needed for nausea or vomiting. (Patient not taking: Reported on 11/21/2018) 20 tablet 1   No current facility-administered medications for this visit.     Review of Systems  Constitutional: Negative for chills, diaphoresis, fever, malaise/fatigue and weight loss.       Feels "alright".  HENT: Negative for ear discharge, ear pain, nosebleeds, sinus pain, sore throat and tinnitus.        Allergies.  Eyes: Negative.  Negative for blurred vision, double vision and photophobia.       Macular degeneration.  Respiratory: Negative for cough, hemoptysis, sputum production and  shortness of breath.        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 COPD.  Cardiovascular: Positive for orthopnea (chronic). Negative for chest pain, palpitations, leg swelling and PND.  Gastrointestinal: Negative.  Negative for abdominal pain, blood in stool, constipation, diarrhea, melena, nausea and vomiting.  Genitourinary: Negative.  Negative for dysuria, frequency, hematuria and urgency.  Musculoskeletal: Positive for back pain (DDD) and joint pain (OA). Negative for falls, myalgias and neck pain.  Skin: Negative.  Negative for itching and rash.  Neurological: Negative.  Negative for dizziness, tremors, sensory change, speech change, weakness and headaches.  Endo/Heme/Allergies: Positive for environmental allergies (season; recieves allergy injections every 4 weeks). Does not bruise/bleed easily.  Psychiatric/Behavioral: Negative.  Negative for depression and memory loss. The patient is not nervous/anxious and does not have insomnia.   All other systems reviewed and are negative.  Performance status (ECOG): 1   Appointment on 02/13/2019  Component Date Value Ref Range Status   Sodium 02/13/2019 139  135 - 145 mmol/L Final   Potassium 02/13/2019 3.6  3.5 - 5.1 mmol/L Final   Chloride 02/13/2019 101  98 - 111 mmol/L Final   CO2 02/13/2019 28  22 - 32 mmol/L Final   Glucose, Bld 02/13/2019 136* 70 - 99 mg/dL Final   BUN 02/13/2019 21  8 - 23 mg/dL Final   Creatinine, Ser 02/13/2019 0.89  0.44 - 1.00 mg/dL Final   Calcium 02/13/2019 10.0  8.9 - 10.3 mg/dL Final   Total Protein 02/13/2019 6.8  6.5 - 8.1 g/dL Final   Albumin 02/13/2019 3.8  3.5 - 5.0 g/dL Final   AST 02/13/2019 24  15 - 41 U/L Final   ALT 02/13/2019 24  0 - 44 U/L Final   Alkaline Phosphatase 02/13/2019 95  38 - 126 U/L Final   Total Bilirubin 02/13/2019 0.6  0.3 - 1.2 mg/dL Final   GFR calc non Af Amer 02/13/2019 >60  >60 mL/min Final   GFR calc Af Amer 02/13/2019 >60  >60 mL/min Final   Anion  gap 02/13/2019 10  5 - 15 Final   Performed  at [facility], [address] [place], [place] [address]   WBC 02/13/2019 12.5* 4.0 - 10.5 K/uL Final   RBC 02/13/2019 4.49  3.87 - 5.11 MIL/uL Final   Hemoglobin 02/13/2019 14.9  12.0 - 15.0 g/dL Final   HCT 02/13/2019 43.2  36.0 - 46.0 % Final   MCV 02/13/2019 96.2  80.0 - 100.0 fL Final   MCH 02/13/2019 33.2  26.0 - 34.0 pg Final   MCHC 02/13/2019 34.5  30.0 - 36.0 g/dL Final   RDW 02/13/2019 13.3  11.5 - 15.5 % Final   Platelets 02/13/2019 412* 150 - 400 K/uL Final   nRBC 02/13/2019 0.0  0.0 - 0.2 % Final   Neutrophils Relative % 02/13/2019 80  % Final   Neutro Abs 02/13/2019 10.1* 1.7 - 7.7 K/uL Final   Lymphocytes Relative 02/13/2019 6  % Final   Lymphs Abs 02/13/2019 0.8  0.7 - 4.0 K/uL Final   Monocytes Relative 02/13/2019 9  % Final   Monocytes Absolute 02/13/2019 1.1* 0.1 - 1.0 K/uL Final   Eosinophils Relative 02/13/2019 3  % Final   Eosinophils Absolute 02/13/2019 0.3  0.0 - 0.5 K/uL Final   Basophils Relative 02/13/2019 1  % Final   Basophils Absolute 02/13/2019 0.1  0.0 - 0.1 K/uL Final   Immature Granulocytes 02/13/2019 1  % Final   Abs Immature Granulocytes 02/13/2019 0.08* 0.00 - 0.07 K/uL Final   Performed at [facility], [address], [place], [place] [address]    Assessment:  BIANEY TESORO is a 78 y.o. female with polycythemia rubra vera.  She has had sleep apnea x 7 years and uses CPAP.  She has a 40 pack year smoking history (stopped 13 years ago).  She denies and cardiac history.  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  There was no significant dysplasia or increased blasts.  Overall features were c/w polycythemia rubra vera.  Work up on 08/08/2018 revealed a WBC of 16,700 ([place] 13,800). Hemoglobin 15.3, hematocrit 45.5, MCV 87.9, and platelets 552,000. Erythropoietin level was normal at 2.9  mIU/mL. BCR/ABL demonstrated no BCR or ABL gene rearrangements. Carbon monoxide level was normal at 3.0% (0-3.6 %).  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   She began a phlebotomy program on 09/07/2018 (last 01/17/2019).  Hematocrit goal is <=42.  She began hydroxyurea 500 mg a day on 09/12/2018.  She was diagnosed with C difficile + diarrhea on 10/10/2018; completed 10 day course of oral vancomycin.  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  Symptomatically, she is doing well.  She notes allergy issues in the spring.  Plan: 1. Review labs from 02/13/2019. 2. Jak 2+ MPN (polycythemia rubra vera) Hematocrit 43.2.  Hemoglobin 14.9. Platelet count 412,000. Hematocrit goal </= 42 and goal platelet goal < 400,000. Discuss small-volume (300 cc) therapeutic phlebotomy today. Discuss increasing hydroxyurea slightly  Hydroxyurea 500 mg daily except Mondays (500 mg BID).  3.   Please schedule phlebotomy. 4.   RTC in 1 month for labs (CBC with diff) +/- phlebotomy. 5.   RTC in 2 months for MD assessment, labs (CBC with diff, CMP), and +/- phlebotomy.    I discussed the assessment and treatment plan with the patient.  The patient was provided an opportunity to ask questions and all were answered.  The patient agreed with the plan and demonstrated an understanding  of the instructions.  The patient was advised to call back or seek an in person evaluation if the symptoms worsen or if the condition fails to improve as anticipated.  I provided 8 minutes (2:07 PM - 2:15 PM) of non-face-to-face time during this encounter.  I provided these services from the [facility] office.   Lequita Asal, MD, PhD  02/14/2019, 2:07 PM

## 2019-02-14 NOTE — Progress Notes (Signed)
No new change noted today, The patient DOB / address/ and name has been verified with tele visit. Cbg, CMA

## 2019-02-17 ENCOUNTER — Other Ambulatory Visit: Payer: Self-pay | Admitting: Adult Health

## 2019-02-17 DIAGNOSIS — J309 Allergic rhinitis, unspecified: Secondary | ICD-10-CM

## 2019-02-27 ENCOUNTER — Ambulatory Visit: Payer: Medicare Other

## 2019-03-06 ENCOUNTER — Other Ambulatory Visit: Payer: Self-pay

## 2019-03-06 ENCOUNTER — Ambulatory Visit (INDEPENDENT_AMBULATORY_CARE_PROVIDER_SITE_OTHER): Payer: Medicare Other

## 2019-03-06 DIAGNOSIS — J301 Allergic rhinitis due to pollen: Secondary | ICD-10-CM | POA: Diagnosis not present

## 2019-03-14 ENCOUNTER — Inpatient Hospital Stay: Payer: Medicare Other

## 2019-03-14 ENCOUNTER — Other Ambulatory Visit: Payer: Self-pay

## 2019-03-14 ENCOUNTER — Telehealth: Payer: Self-pay

## 2019-03-14 VITALS — BP 132/85 | HR 80 | Temp 96.2°F | Resp 18

## 2019-03-14 DIAGNOSIS — D45 Polycythemia vera: Secondary | ICD-10-CM

## 2019-03-14 LAB — CBC WITH DIFFERENTIAL/PLATELET
Abs Immature Granulocytes: 0.11 10*3/uL — ABNORMAL HIGH (ref 0.00–0.07)
Basophils Absolute: 0.1 10*3/uL (ref 0.0–0.1)
Basophils Relative: 1 %
Eosinophils Absolute: 0.4 10*3/uL (ref 0.0–0.5)
Eosinophils Relative: 4 %
HCT: 43.2 % (ref 36.0–46.0)
Hemoglobin: 15.2 g/dL — ABNORMAL HIGH (ref 12.0–15.0)
Immature Granulocytes: 1 %
Lymphocytes Relative: 7 %
Lymphs Abs: 0.8 10*3/uL (ref 0.7–4.0)
MCH: 34.2 pg — ABNORMAL HIGH (ref 26.0–34.0)
MCHC: 35.2 g/dL (ref 30.0–36.0)
MCV: 97.1 fL (ref 80.0–100.0)
Monocytes Absolute: 1.1 10*3/uL — ABNORMAL HIGH (ref 0.1–1.0)
Monocytes Relative: 10 %
Neutro Abs: 8.8 10*3/uL — ABNORMAL HIGH (ref 1.7–7.7)
Neutrophils Relative %: 77 %
Platelets: 314 10*3/uL (ref 150–400)
RBC: 4.45 MIL/uL (ref 3.87–5.11)
RDW: 13.5 % (ref 11.5–15.5)
WBC: 11.4 10*3/uL — ABNORMAL HIGH (ref 4.0–10.5)
nRBC: 0 % (ref 0.0–0.2)

## 2019-03-14 NOTE — Telephone Encounter (Signed)
-----   Message from Lequita Asal, MD sent at 03/14/2019  3:45 PM EDT ----- Regarding: Please call patient  CBC looks good.  Platelet counts is < 400,000.  Continue current dose of hydroxyurea.  M ----- Message ----- From: Buel Ream, Lab In [employer] Sent: 03/14/2019   2:03 PM EDT To: Lequita Asal, MD

## 2019-03-14 NOTE — Telephone Encounter (Signed)
Spoke with the patient to inform her that her platelets is 400,000 and continue current dose of hydroxyurea. The patient was understanding and agreeable.

## 2019-03-14 NOTE — Patient Instructions (Signed)

## 2019-04-03 ENCOUNTER — Other Ambulatory Visit: Payer: Self-pay

## 2019-04-03 ENCOUNTER — Ambulatory Visit (INDEPENDENT_AMBULATORY_CARE_PROVIDER_SITE_OTHER): Payer: Medicare Other

## 2019-04-03 DIAGNOSIS — J301 Allergic rhinitis due to pollen: Secondary | ICD-10-CM | POA: Diagnosis not present

## 2019-04-03 MED ORDER — FLUTICASONE PROPIONATE 50 MCG/ACT NA SUSP: 2.0000 | Freq: Every day | NASAL | 2 refills | Status: DC

## 2019-04-24 ENCOUNTER — Other Ambulatory Visit: Payer: Self-pay | Admitting: Family Medicine

## 2019-04-24 DIAGNOSIS — Z1231 Encounter for screening mammogram for malignant neoplasm of breast: Secondary | ICD-10-CM

## 2019-04-30 ENCOUNTER — Ambulatory Visit
Admission: RE | Admit: 2019-04-30 | Discharge: 2019-04-30 | Disposition: A | Discharge status: Home / Self Care | Payer: Medicare Other | Source: Ambulatory Visit | Attending: Family Medicine | Admitting: Family Medicine

## 2019-04-30 ENCOUNTER — Other Ambulatory Visit: Payer: Self-pay

## 2019-04-30 DIAGNOSIS — Z1231 Encounter for screening mammogram for malignant neoplasm of breast: Secondary | ICD-10-CM | POA: Insufficient documentation

## 2019-05-01 ENCOUNTER — Other Ambulatory Visit: Payer: Medicare Other

## 2019-05-01 ENCOUNTER — Ambulatory Visit: Payer: Medicare Other

## 2019-05-01 ENCOUNTER — Other Ambulatory Visit: Payer: Self-pay | Admitting: *Deleted

## 2019-05-01 ENCOUNTER — Ambulatory Visit: Payer: Medicare Other | Admitting: Hematology and Oncology

## 2019-05-01 ENCOUNTER — Telehealth: Payer: Self-pay

## 2019-05-01 DIAGNOSIS — D45 Polycythemia vera: Secondary | ICD-10-CM

## 2019-05-01 MED ORDER — HYDROXYUREA 500 MG PO CAPS: 500.0000 mg | ORAL_CAPSULE | Freq: Every day | ORAL | 0 refills | Status: DC

## 2019-05-01 NOTE — Telephone Encounter (Signed)
CBC with Differential/Platelet Order: [id number] Status:  Final result Visible to patient:  Yes (MyChart) Next appt:  05/09/2019 at 01:15 PM in Oncology ([facility]) Dx:  Polycythemia rubra vera ([place])  Ref Range & Units 33mo ago 78mo ago 17mo ago  WBC 4.0 - 10.5 K/uL 11.4High   12.5High   11.6High    RBC 3.87 - 5.11 MIL/uL 4.45  4.49  4.35   Hemoglobin 12.0 - 15.0 g/dL 15.2High   14.9  14.8   HCT 36.0 - 46.0 % 43.2  43.2  41.8   MCV 80.0 - 100.0 fL 97.1  96.2  96.1   MCH 26.0 - 34.0 pg 34.2High   33.2  34.0   MCHC 30.0 - 36.0 g/dL 35.2  34.5  35.4   RDW 11.5 - 15.5 % 13.5  13.3  13.7   Platelets 150 - 400 K/uL 314  412High   343   nRBC 0.0 - 0.2 % 0.0  0.0  0.0   Neutrophils Relative % % 77  80  80   Neutro Abs 1.7 - 7.7 K/uL 8.8High   10.1High   9.1High    Lymphocytes Relative % 7  6  7    Lymphs Abs 0.7 - 4.0 K/uL 0.8  0.8  0.8   Monocytes Relative % 10  9  9    Monocytes Absolute 0.1 - 1.0 K/uL 1.1High   1.1High   1.1High    Eosinophils Relative % 4  3  3    Eosinophils Absolute 0.0 - 0.5 K/uL 0.4  0.3  0.4   Basophils Relative % 1  1  1    Basophils Absolute 0.0 - 0.1 K/uL 0.1  0.1  0.2High    Immature Granulocytes % 1  1  0   Abs Immature Granulocytes 0.00 - 0.07 K/uL 0.11High   0.08High  CM  0.04 CM   Comment: Performed at [facility], [address], [place], [place] [address]  Resulting Agency  [facility]      Specimen Collected: 03/14/19 13:57 Last Resulted: 03/14/19 14:03

## 2019-05-01 NOTE — Telephone Encounter (Signed)
The patient medication has been refill Hydrea 500 mg # 90 with no refills. The patient was made aware.

## 2019-05-03 ENCOUNTER — Other Ambulatory Visit: Payer: Self-pay

## 2019-05-03 MED ORDER — ALBUTEROL SULFATE 1.25 MG/3ML IN NEBU: 1.0000 | INHALATION_SOLUTION | Freq: Four times a day (QID) | RESPIRATORY_TRACT | 3 refills | Status: DC | PRN

## 2019-05-08 NOTE — Progress Notes (Signed)
[facility]  [address] [place], [place] [address] Phone: [phone]  Fax: [phone]   Clinic Day:  05/09/2019  Referring physician: Sofie Hartigan, MD  Chief Complaint: Angelica Juarez is a 78 y.o. 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  HPI: The patient was last seen in the hematology clinic on 02/14/2019 via telephone. At that time, she was doing well.  She noted allergy issues in the spring.  CBC revealed a hematocrit of 43.2, hemoglobin 14.9, MCV 96.2, platelets 412,000, WBC 12,500 with an ANC of 10,100.  She continued hydroxyurea 500 mg daily.  Labs on 03/14/2019: WBC 11,400, hemoglobin 15.2, hematocrit 43.2, platelets 314,000.  Patient continued on current hydroxyurea dose. She underwent therapeutic phlebotomy.   During the interim, she is doing okay. She has had difficulty sleeping, which is a chronic issue. She has had difficulty breathing and shortness of breath, using her nebulizer 2-3x daily. She reports she is close to being put on oxygen. She attributes it to allergies and wearing a mask. She has sleep apnea and COPD.   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   She has continued on hydrea 500mg  once daily. She denies any new medications or herbal products. She denies having passed a gallstone.    Past Medical History:  Diagnosis Date  . Allergy    Seasonal  . Arthritis   . 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 Chronic kidney disease    chronic renal insufficiency  . Colon polyps 05/21/14  . COPD (chronic obstructive pulmonary disease) ([place])   . Diabetes mellitus without complication ([place])   . Dyspnea   . Fracture closed, humerus, shaft    right   . History of kidney stones    40 years ago  . Hyperlipidemia 04/30/14  . Hypertension   . Impingement syndrome of right  shoulder 12/29/15  . Personal history of radiation therapy   . Pneumonia    hx  . Rotator cuff tear 04/21/16   right  . Seizures ([place])   . Sleep apnea     Past Surgical History:  Procedure Laterality Date  . ABDOMINAL HYSTERECTOMY    . 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 EYE SURGERY     bilateral cataracts  . JOINT REPLACEMENT     bilateral knee replacement  . NASAL SEPTUM SURGERY    . OOPHORECTOMY    . REPLACEMENT TOTAL KNEE Bilateral 2004  . SHOULDER ARTHROSCOPY WITH BICEPSTENOTOMY Right 05/25/2016   Procedure: SHOULDER ARTHROSCOPY WITH BICEPSTENOTOMY;  Surgeon: Leanor Kail, MD;  Location: [facility] ORS;  Service: Orthopedics;  Laterality: Right;  . SHOULDER ARTHROSCOPY WITH DISTAL CLAVICLE RESECTION Right 05/25/2016   Procedure: SHOULDER ARTHROSCOPY WITH DISTAL CLAVICLE RESECTION;  Surgeon: Leanor Kail, MD;  Location: [facility] ORS;  Service: Orthopedics;  Laterality: Right;  . SHOULDER ARTHROSCOPY WITH OPEN ROTATOR CUFF REPAIR Right 05/25/2016   Procedure: SHOULDER ARTHROSCOPY WITH OPEN ROTATOR CUFF REPAIR;  Surgeon: Leanor Kail, MD;  Location: [facility] ORS;  Service: Orthopedics;  Laterality: Right;  . SUBACROMIAL DECOMPRESSION Right 05/25/2016   Procedure: SUBACROMIAL DECOMPRESSION;  Surgeon: Leanor Kail, MD;  Location: [facility] ORS;  Service: Orthopedics;  Laterality: Right;    Family History  Problem Relation Age of Onset  . Breast cancer Paternal Aunt 91  . Diabetes Father   .  Heart disease Father     Social History:  reports that she quit smoking about 14 years ago. She has a 40.00 pack-year smoking history. She has never used smokeless tobacco. She reports that she does not drink alcohol or use drugs. Patient moved to [place] in 2001 from [place]. Patient is a former 1 ppd smoker x 40 years; quit in 2006. She is retired from [employer]. Patient denies known exposures to radiation on toxins. Her significant other is [employer]. She is alone today.   Allergies:   Allergies  Allergen Reactions  . Iodinated Diagnostic Agents Anaphylaxis    Other reaction(s): Other (See Comments) Throat swells and extreme hives  . Latex Itching  . Phenobarbital Hives  . Tape Rash    silicones    Current Medications: Current Outpatient Medications  Medication Sig Dispense Refill  . albuterol (ACCUNEB) 1.25 MG/3ML nebulizer solution Take 3 mLs (1.25 mg total) by nebulization every 6 (six) hours as needed. wheezing 75 mL 3  . aspirin 81 MG tablet Take 81 mg by mouth daily.    Marland Kitchen atorvastatin (LIPITOR) 40 MG tablet Take 40 mg by mouth daily.    . B Complex Vitamins (VITAMIN B COMPLEX PO) Take 1 tablet by mouth daily.    . Calcium Carbonate-Vitamin D 600-200 MG-UNIT CAPS Take 1 capsule by mouth daily.    . cloNIDine (CATAPRES) 0.2 MG tablet Take 0.2 mg by mouth 2 (two) times daily.    . diphenhydrAMINE (BENADRYL) 25 mg capsule Take 25 mg by mouth daily.    . fluticasone (FLONASE) 50 MCG/ACT nasal spray Place 2 sprays into both nostrils daily. 16 g 2  . Fluticasone-Umeclidin-Vilant (TRELEGY ELLIPTA) 100-62.5-25 MCG/INH AEPB Inhale 1 puff into the lungs daily. 1 each 4  . furosemide (LASIX) 20 MG tablet Take 1 tablet (20 mg total) by mouth 2 (two) times daily. 60 tablet 0  . glucose blood (ONE TOUCH ULTRA TEST) test strip USE ONE STRIP TO CHECK GLUCOSE ONCE DAILY    . hydrALAZINE (APRESOLINE) 100 MG tablet Take 50 mg by mouth 3 (three) times daily.     . hydroxyurea (HYDREA) 500 MG capsule Take 1 capsule (500 mg total) by mouth daily. May take with food to minimize GI side effects. 90 capsule 0  . levETIRAcetam (KEPPRA) 750 MG tablet Take 750 mg by mouth 2 (two) times daily.    . metFORMIN (GLUCOPHAGE) 500 MG tablet Take 500 mg by mouth 2 (two) times daily with a meal.    . metoprolol succinate (TOPROL-XL) 100 MG 24 hr tablet Take 100 mg by mouth daily.    . montelukast (SINGULAIR) 10 MG tablet TAKE 1 TABLET BY MOUTH EVERY DAY 90 tablet 0  . Multiple Vitamin  (MULTI-VITAMINS) TABS Take 1 tablet by mouth daily.    Marland Kitchen NIFEdipine (NIFEDICAL XL) 30 MG 24 hr tablet Take 30 mg by mouth daily.    Marland Kitchen OVER THE COUNTER MEDICATION Place 1 drop into both eyes daily. Allergy eye drops    . sitaGLIPtin (JANUVIA) 25 MG tablet Take 1 tablet by mouth daily.    . traZODone (DESYREL) 50 MG tablet Take 50-100 mg by mouth at bedtime as needed. Sleep  0  . valsartan-hydrochlorothiazide (DIOVAN-HCT) 320-25 MG tablet Take 1 tablet by mouth daily.  0  . VENTOLIN HFA 108 (90 Base) MCG/ACT inhaler INHALE 2 PUFFS INTO THE LUNGS EVERY 6 HOURS AS NEEDED 54 g 1  . vitamin C (ASCORBIC ACID) 500 MG tablet Take 500 mg by  mouth 2 (two) times daily.    Marland Kitchen EPINEPHrine 0.3 mg/0.3 mL IJ SOAJ injection INJ 0.3 ML IM ONCE FOR 1 DOSE     No current facility-administered medications for this visit.     Review of Systems  Constitutional: Negative for chills, diaphoresis, fever, malaise/fatigue and weight loss (Up 7lbs).       Feels "alright".  HENT: Positive for congestion (allergies). Negative for hearing loss, nosebleeds, sinus pain and sore throat.   Eyes: Negative.  Negative for blurred vision, double vision and photophobia.       Macular degeneration left eye, receives injection.   Respiratory: Positive for shortness of breath. Negative for cough, hemoptysis and sputum production.        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 COPD.  Cardiovascular: Positive for orthopnea (chronic). Negative for chest pain, palpitations, leg swelling and PND.  Gastrointestinal: Negative.  Negative for abdominal pain, blood in stool, constipation, diarrhea, melena, nausea and vomiting.  Genitourinary: Negative.  Negative for dysuria, frequency, hematuria and urgency.  Musculoskeletal: Positive for back pain (DDD) and joint pain (OA in shoulders). Negative for falls, myalgias and neck pain.  Skin: Negative.  Negative for itching and rash.  Neurological: Negative.  Negative for dizziness, tremors,  sensory change, speech change, weakness and headaches.  Endo/Heme/Allergies: Positive for environmental allergies (season; recieves allergy injections every 4 weeks). Does not bruise/bleed easily.  Psychiatric/Behavioral: Negative for depression and memory loss. The patient has insomnia (difficulty sleeping). The patient is not nervous/anxious.   All other systems reviewed and are negative.  Performance status (ECOG): 1  Vitals: Blood pressure 125/79, pulse 65, temperature 97.6 F (36.4 C), temperature source Tympanic, resp. rate 20, weight 225 lb 5 oz (102.2 kg), SpO2 97 %.  Physical Exam  Constitutional: She is oriented to person, place, and time. She appears well-developed and well-nourished. No distress.  HENT:  Head: Normocephalic and atraumatic.  Mouth/Throat: Oropharynx is clear and moist. No oropharyngeal exudate.  Short styled gray hair.  Eyes: Pupils are equal, round, and reactive to light. Conjunctivae and EOM are normal. No scleral icterus.  Glasses.  Neck: Normal range of motion. Neck supple.  Cardiovascular: Normal rate, regular rhythm and normal heart sounds.  No murmur heard. Pulmonary/Chest: Effort normal and breath sounds normal. No respiratory distress. She has no wheezes. She has no rales.  Abdominal: Soft. Bowel sounds are normal. She exhibits no distension. There is no abdominal tenderness.  Musculoskeletal: Normal range of motion.        General: No edema.  Lymphadenopathy:    She has no cervical adenopathy.    She has no axillary adenopathy.       Right: No supraclavicular adenopathy present.       Left: No supraclavicular adenopathy present.  Neurological: She is alert and oriented to person, place, and time.  Skin: Skin is warm and dry. She is not diaphoretic. No erythema.  Psychiatric: She has a normal mood and affect. Her behavior is normal. Judgment and thought content normal.  Nursing note and vitals reviewed.   Appointment on 05/09/2019  Component  Date Value Ref Range Status  . Sodium 05/09/2019 139  135 - 145 mmol/L Final  . Potassium 05/09/2019 4.1  3.5 - 5.1 mmol/L Final  . Chloride 05/09/2019 104  98 - 111 mmol/L Final  . CO2 05/09/2019 25  22 - 32 mmol/L Final  . Glucose, Bld 05/09/2019 101* 70 - 99 mg/dL Final  . BUN 05/09/2019 16  8 - 23  mg/dL Final  . Creatinine, Ser 05/09/2019 0.96  0.44 - 1.00 mg/dL Final  . Calcium 05/09/2019 9.8  8.9 - 10.3 mg/dL Final  . Total Protein 05/09/2019 6.5  6.5 - 8.1 g/dL Final  . Albumin 05/09/2019 4.0  3.5 - 5.0 g/dL Final  . AST 05/09/2019 43* 15 - 41 U/L Final  . ALT 05/09/2019 56* 0 - 44 U/L Final  . Alkaline Phosphatase 05/09/2019 77  38 - 126 U/L Final  . Total Bilirubin 05/09/2019 1.2  0.3 - 1.2 mg/dL Final  . GFR calc non Af Amer 05/09/2019 57* >60 mL/min Final  . GFR calc Af Amer 05/09/2019 >60  >60 mL/min Final  . Anion gap 05/09/2019 10  5 - 15 Final   Performed at [facility], [address] [place], [place] [address]  . WBC 05/09/2019 12.2* 4.0 - 10.5 K/uL Final  . RBC 05/09/2019 4.03  3.87 - 5.11 MIL/uL Final  . Hemoglobin 05/09/2019 13.8  12.0 - 15.0 g/dL Final  . HCT 05/09/2019 39.7  36.0 - 46.0 % Final  . MCV 05/09/2019 98.5  80.0 - 100.0 fL Final  . MCH 05/09/2019 34.2* 26.0 - 34.0 pg Final  . MCHC 05/09/2019 34.8  30.0 - 36.0 g/dL Final  . RDW 05/09/2019 12.9  11.5 - 15.5 % Final  . Platelets 05/09/2019 353  150 - 400 K/uL Final  . nRBC 05/09/2019 0.0  0.0 - 0.2 % Final  . Neutrophils Relative % 05/09/2019 79  % Final  . Neutro Abs 05/09/2019 9.6* 1.7 - 7.7 K/uL Final  . Lymphocytes Relative 05/09/2019 6  % Final  . Lymphs Abs 05/09/2019 0.7  0.7 - 4.0 K/uL Final  . Monocytes Relative 05/09/2019 9  % Final  . Monocytes Absolute 05/09/2019 1.1* 0.1 - 1.0 K/uL Final  . Eosinophils Relative 05/09/2019 4  % Final  . Eosinophils Absolute 05/09/2019 0.5  0.0 - 0.5 K/uL Final  . Basophils Relative 05/09/2019 1  % Final  . Basophils Absolute 05/09/2019 0.1   0.0 - 0.1 K/uL Final  . Immature Granulocytes 05/09/2019 1  % Final  . Abs Immature Granulocytes 05/09/2019 0.10* 0.00 - 0.07 K/uL Final   Performed at [facility], [address] [place], [place] [address]    Assessment:  Angelica Juarez is a 78 y.o. female with polycythemia rubra vera.  She has had sleep apnea x 7 years and uses CPAP.  She has a 40 pack year smoking history (stopped 13 years ago).  She denies and cardiac history.  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  There was no significant dysplasia or increased blasts.  Overall features were c/w polycythemia rubra vera.  Work up on 08/08/2018 revealed a WBC of 16,700 ([place] 13,800). Hemoglobin 15.3, hematocrit 45.5, MCV 87.9, and platelets 552,000. Erythropoietin level was normal at 2.9 mIU/mL. BCR/ABL demonstrated no BCR or ABL gene rearrangements. Carbon monoxide level was normal at 3.0% (0-3.6 %).  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   She began a phlebotomy program on 09/07/2018 (last 03/14/2019).  Hematocrit goal is <=42.  She began hydroxyurea 500 mg a day on 09/12/2018.  She was diagnosed with C difficile + diarrhea on 10/10/2018; completed 10 day course of oral vancomycin.  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  Symptomatically, she is doing well.  She denies any B symptoms.  Exam  reveals no adenopathy or hepatosplenomegaly.  Plan: 1.   Labs today: CBC with differential, CMP. 2.   Polycythemia rubra vera Hematocrit 39.7.  Hemoglobin 13.8.  Platelets 353,000 Hematocrit goal </= 42.  Platelet goal < 400,000. No phlebotomy today. Continue hydroxyurea 500 mg a day. 3.   Mild increase in LFTs  Etiology unclear.    RTC in 2 weeks for LFTs. 4.   RTC in 3 months for MD assessment and labs (CBC with diff, CMP).  I discussed the assessment and treatment plan with the patient.   The patient was provided an opportunity to ask questions and all were answered.  The patient agreed with the plan and demonstrated an understanding of the instructions.  The patient was advised to call back if the symptoms worsen or if the condition fails to improve as anticipated.   Lequita Asal, MD, PhD    05/09/2019, 2:10 PM  I, Molly Dorshimer, am acting as Education administrator for [employer]. Mike Gip, MD, PhD.  I, Melissa C. Mike Gip, MD, have reviewed the above documentation for accuracy and completeness, and I agree with the above.

## 2019-05-09 ENCOUNTER — Inpatient Hospital Stay: Payer: Medicare Other

## 2019-05-09 ENCOUNTER — Inpatient Hospital Stay: Payer: Medicare Other | Attending: Hematology and Oncology

## 2019-05-09 ENCOUNTER — Other Ambulatory Visit: Payer: Self-pay

## 2019-05-09 ENCOUNTER — Encounter: Payer: Self-pay | Admitting: Hematology and Oncology

## 2019-05-09 ENCOUNTER — Inpatient Hospital Stay (HOSPITAL_BASED_OUTPATIENT_CLINIC_OR_DEPARTMENT_OTHER): Payer: Medicare Other | Admitting: Hematology and Oncology

## 2019-05-09 VITALS — BP 125/79 | HR 65 | Temp 97.6°F | Resp 20 | Wt 225.3 lb

## 2019-05-09 DIAGNOSIS — G4730 Sleep apnea, unspecified: Secondary | ICD-10-CM

## 2019-05-09 DIAGNOSIS — Z87891 Personal history of nicotine dependence: Secondary | ICD-10-CM

## 2019-05-09 DIAGNOSIS — D45 Polycythemia vera: Secondary | ICD-10-CM | POA: Insufficient documentation

## 2019-05-09 DIAGNOSIS — Z79899 Other long term (current) drug therapy: Secondary | ICD-10-CM

## 2019-05-09 DIAGNOSIS — J449 Chronic obstructive pulmonary disease, unspecified: Secondary | ICD-10-CM | POA: Diagnosis not present

## 2019-05-09 DIAGNOSIS — R7989 Other specified abnormal findings of blood chemistry: Secondary | ICD-10-CM

## 2019-05-09 LAB — CBC WITH DIFFERENTIAL/PLATELET
Abs Immature Granulocytes: 0.1 10*3/uL — ABNORMAL HIGH (ref 0.00–0.07)
Basophils Absolute: 0.1 10*3/uL (ref 0.0–0.1)
Basophils Relative: 1 %
Eosinophils Absolute: 0.5 10*3/uL (ref 0.0–0.5)
Eosinophils Relative: 4 %
HCT: 39.7 % (ref 36.0–46.0)
Hemoglobin: 13.8 g/dL (ref 12.0–15.0)
Immature Granulocytes: 1 %
Lymphocytes Relative: 6 %
Lymphs Abs: 0.7 10*3/uL (ref 0.7–4.0)
MCH: 34.2 pg — ABNORMAL HIGH (ref 26.0–34.0)
MCHC: 34.8 g/dL (ref 30.0–36.0)
MCV: 98.5 fL (ref 80.0–100.0)
Monocytes Absolute: 1.1 10*3/uL — ABNORMAL HIGH (ref 0.1–1.0)
Monocytes Relative: 9 %
Neutro Abs: 9.6 10*3/uL — ABNORMAL HIGH (ref 1.7–7.7)
Neutrophils Relative %: 79 %
Platelets: 353 10*3/uL (ref 150–400)
RBC: 4.03 MIL/uL (ref 3.87–5.11)
RDW: 12.9 % (ref 11.5–15.5)
WBC: 12.2 10*3/uL — ABNORMAL HIGH (ref 4.0–10.5)
nRBC: 0 % (ref 0.0–0.2)

## 2019-05-09 LAB — COMPREHENSIVE METABOLIC PANEL
ALT: 56 U/L — ABNORMAL HIGH (ref 0–44)
AST: 43 U/L — ABNORMAL HIGH (ref 15–41)
Albumin: 4 g/dL (ref 3.5–5.0)
Alkaline Phosphatase: 77 U/L (ref 38–126)
Anion gap: 10 (ref 5–15)
BUN: 16 mg/dL (ref 8–23)
CO2: 25 mmol/L (ref 22–32)
Calcium: 9.8 mg/dL (ref 8.9–10.3)
Chloride: 104 mmol/L (ref 98–111)
Creatinine, Ser: 0.96 mg/dL (ref 0.44–1.00)
GFR calc Af Amer: >60 mL/min (ref >60)
GFR calc non Af Amer: 57 mL/min — ABNORMAL LOW (ref >60)
Glucose, Bld: 101 mg/dL — ABNORMAL HIGH (ref 70–99)
Potassium: 4.1 mmol/L (ref 3.5–5.1)
Sodium: 139 mmol/L (ref 135–145)
Total Bilirubin: 1.2 mg/dL (ref 0.3–1.2)
Total Protein: 6.5 g/dL (ref 6.5–8.1)

## 2019-05-09 NOTE — Progress Notes (Signed)
Pt here for follow up. Would like to discuss Hydrea.

## 2019-05-15 ENCOUNTER — Other Ambulatory Visit: Payer: Self-pay

## 2019-05-15 ENCOUNTER — Ambulatory Visit (INDEPENDENT_AMBULATORY_CARE_PROVIDER_SITE_OTHER): Payer: Medicare Other

## 2019-05-15 DIAGNOSIS — J301 Allergic rhinitis due to pollen: Secondary | ICD-10-CM

## 2019-05-22 ENCOUNTER — Other Ambulatory Visit: Payer: Self-pay

## 2019-05-23 ENCOUNTER — Inpatient Hospital Stay: Payer: Medicare Other | Attending: Hematology and Oncology

## 2019-05-23 DIAGNOSIS — D45 Polycythemia vera: Secondary | ICD-10-CM

## 2019-05-23 LAB — HEPATIC FUNCTION PANEL
ALT: 34 U/L (ref 0–44)
AST: 32 U/L (ref 15–41)
Albumin: 3.9 g/dL (ref 3.5–5.0)
Alkaline Phosphatase: 86 U/L (ref 38–126)
Bilirubin, Direct: 0.2 mg/dL (ref 0.0–0.2)
Indirect Bilirubin: 0.7 mg/dL (ref 0.3–0.9)
Total Bilirubin: 0.9 mg/dL (ref 0.3–1.2)
Total Protein: 6.5 g/dL (ref 6.5–8.1)

## 2019-05-23 LAB — CBC WITH DIFFERENTIAL/PLATELET
Abs Immature Granulocytes: 0.14 10*3/uL — ABNORMAL HIGH (ref 0.00–0.07)
Basophils Absolute: 0.1 10*3/uL (ref 0.0–0.1)
Basophils Relative: 1 %
Eosinophils Absolute: 0.5 10*3/uL (ref 0.0–0.5)
Eosinophils Relative: 4 %
HCT: 40.2 % (ref 36.0–46.0)
Hemoglobin: 13.6 g/dL (ref 12.0–15.0)
Immature Granulocytes: 1 %
Lymphocytes Relative: 5 %
Lymphs Abs: 0.7 10*3/uL (ref 0.7–4.0)
MCH: 33.9 pg (ref 26.0–34.0)
MCHC: 33.8 g/dL (ref 30.0–36.0)
MCV: 100.2 fL — ABNORMAL HIGH (ref 80.0–100.0)
Monocytes Absolute: 0.9 10*3/uL (ref 0.1–1.0)
Monocytes Relative: 7 %
Neutro Abs: 11.4 10*3/uL — ABNORMAL HIGH (ref 1.7–7.7)
Neutrophils Relative %: 82 %
Platelets: 296 10*3/uL (ref 150–400)
RBC: 4.01 MIL/uL (ref 3.87–5.11)
RDW: 13.6 % (ref 11.5–15.5)
WBC: 13.8 10*3/uL — ABNORMAL HIGH (ref 4.0–10.5)
nRBC: 0 % (ref 0.0–0.2)

## 2019-06-01 ENCOUNTER — Other Ambulatory Visit: Payer: Self-pay

## 2019-06-01 ENCOUNTER — Inpatient Hospital Stay
Admission: EM | Admit: 2019-06-01 | Discharge: 2019-06-03 | DRG: 291 | Disposition: A | Discharge status: Home Health | Payer: Medicare Other | Source: Ambulatory Visit | Attending: Internal Medicine | Admitting: Internal Medicine

## 2019-06-01 ENCOUNTER — Emergency Department: Payer: Medicare Other

## 2019-06-01 ENCOUNTER — Encounter: Payer: Self-pay | Admitting: Emergency Medicine

## 2019-06-01 DIAGNOSIS — Z96653 Presence of artificial knee joint, bilateral: Secondary | ICD-10-CM | POA: Diagnosis present

## 2019-06-01 DIAGNOSIS — I509 Heart failure, unspecified: Secondary | ICD-10-CM

## 2019-06-01 DIAGNOSIS — Z833 Family history of diabetes mellitus: Secondary | ICD-10-CM | POA: Diagnosis not present

## 2019-06-01 DIAGNOSIS — Z91041 Radiographic dye allergy status: Secondary | ICD-10-CM

## 2019-06-01 DIAGNOSIS — I5033 Acute on chronic diastolic (congestive) heart failure: Secondary | ICD-10-CM | POA: Diagnosis present

## 2019-06-01 DIAGNOSIS — J441 Chronic obstructive pulmonary disease with (acute) exacerbation: Secondary | ICD-10-CM | POA: Diagnosis present

## 2019-06-01 DIAGNOSIS — Z20828 Contact with and (suspected) exposure to other viral communicable diseases: Secondary | ICD-10-CM | POA: Diagnosis present

## 2019-06-01 DIAGNOSIS — E785 Hyperlipidemia, unspecified: Secondary | ICD-10-CM | POA: Diagnosis present

## 2019-06-01 DIAGNOSIS — Z7982 Long term (current) use of aspirin: Secondary | ICD-10-CM | POA: Diagnosis not present

## 2019-06-01 DIAGNOSIS — Z8249 Family history of ischemic heart disease and other diseases of the circulatory system: Secondary | ICD-10-CM | POA: Diagnosis not present

## 2019-06-01 DIAGNOSIS — G4733 Obstructive sleep apnea (adult) (pediatric): Secondary | ICD-10-CM | POA: Diagnosis present

## 2019-06-01 DIAGNOSIS — Z7984 Long term (current) use of oral hypoglycemic drugs: Secondary | ICD-10-CM | POA: Diagnosis not present

## 2019-06-01 DIAGNOSIS — G40909 Epilepsy, unspecified, not intractable, without status epilepticus: Secondary | ICD-10-CM | POA: Diagnosis present

## 2019-06-01 DIAGNOSIS — Z87891 Personal history of nicotine dependence: Secondary | ICD-10-CM | POA: Diagnosis not present

## 2019-06-01 DIAGNOSIS — E119 Type 2 diabetes mellitus without complications: Secondary | ICD-10-CM | POA: Diagnosis present

## 2019-06-01 DIAGNOSIS — I130 Hypertensive heart and chronic kidney disease with heart failure and stage 1 through stage 4 chronic kidney disease, or unspecified chronic kidney disease: Secondary | ICD-10-CM | POA: Diagnosis present

## 2019-06-01 DIAGNOSIS — I4891 Unspecified atrial fibrillation: Secondary | ICD-10-CM | POA: Diagnosis present

## 2019-06-01 DIAGNOSIS — J9621 Acute and chronic respiratory failure with hypoxia: Secondary | ICD-10-CM | POA: Diagnosis present

## 2019-06-01 DIAGNOSIS — M7989 Other specified soft tissue disorders: Secondary | ICD-10-CM

## 2019-06-01 DIAGNOSIS — J9602 Acute respiratory failure with hypercapnia: Secondary | ICD-10-CM | POA: Diagnosis present

## 2019-06-01 DIAGNOSIS — J9601 Acute respiratory failure with hypoxia: Secondary | ICD-10-CM | POA: Diagnosis present

## 2019-06-01 LAB — CBC WITH DIFFERENTIAL/PLATELET
Abs Immature Granulocytes: 0.09 10*3/uL — ABNORMAL HIGH (ref 0.00–0.07)
Basophils Absolute: 0.1 10*3/uL (ref 0.0–0.1)
Basophils Relative: 1 %
Eosinophils Absolute: 0.3 10*3/uL (ref 0.0–0.5)
Eosinophils Relative: 3 %
HCT: 42.9 % (ref 36.0–46.0)
Hemoglobin: 14.1 g/dL (ref 12.0–15.0)
Immature Granulocytes: 1 %
Lymphocytes Relative: 4 %
Lymphs Abs: 0.4 10*3/uL — ABNORMAL LOW (ref 0.7–4.0)
MCH: 33.7 pg (ref 26.0–34.0)
MCHC: 32.9 g/dL (ref 30.0–36.0)
MCV: 102.4 fL — ABNORMAL HIGH (ref 80.0–100.0)
Monocytes Absolute: 0.8 10*3/uL (ref 0.1–1.0)
Monocytes Relative: 7 %
Neutro Abs: 8.9 10*3/uL — ABNORMAL HIGH (ref 1.7–7.7)
Neutrophils Relative %: 84 %
Platelets: 326 10*3/uL (ref 150–400)
RBC: 4.19 MIL/uL (ref 3.87–5.11)
RDW: 14.3 % (ref 11.5–15.5)
WBC: 10.6 10*3/uL — ABNORMAL HIGH (ref 4.0–10.5)
nRBC: 0 % (ref 0.0–0.2)

## 2019-06-01 LAB — GLUCOSE, CAPILLARY: Glucose-Capillary: 334 mg/dL — ABNORMAL HIGH (ref 70–99)

## 2019-06-01 LAB — COMPREHENSIVE METABOLIC PANEL
ALT: 32 U/L (ref 0–44)
AST: 31 U/L (ref 15–41)
Albumin: 4.4 g/dL (ref 3.5–5.0)
Alkaline Phosphatase: 76 U/L (ref 38–126)
Anion gap: 11 (ref 5–15)
BUN: 15 mg/dL (ref 8–23)
CO2: 27 mmol/L (ref 22–32)
Calcium: 9.8 mg/dL (ref 8.9–10.3)
Chloride: 104 mmol/L (ref 98–111)
Creatinine, Ser: 0.9 mg/dL (ref 0.44–1.00)
GFR calc Af Amer: >60 mL/min (ref >60)
GFR calc non Af Amer: >60 mL/min (ref >60)
Glucose, Bld: 129 mg/dL — ABNORMAL HIGH (ref 70–99)
Potassium: 4 mmol/L (ref 3.5–5.1)
Sodium: 142 mmol/L (ref 135–145)
Total Bilirubin: 1.3 mg/dL — ABNORMAL HIGH (ref 0.3–1.2)
Total Protein: 6.9 g/dL (ref 6.5–8.1)

## 2019-06-01 LAB — URINALYSIS, COMPLETE (UACMP) WITH MICROSCOPIC
Bacteria, UA: NONE SEEN
Bilirubin Urine: NEGATIVE
Glucose, UA: NEGATIVE mg/dL
Hgb urine dipstick: NEGATIVE
Ketones, ur: NEGATIVE mg/dL
Nitrite: NEGATIVE
Protein, ur: 30 mg/dL — AB
Specific Gravity, Urine: 1.006 (ref 1.005–1.030)
pH: 6 (ref 5.0–8.0)

## 2019-06-01 LAB — TROPONIN I (HIGH SENSITIVITY)
Troponin I (High Sensitivity): 9 ng/L (ref <18)
Troponin I (High Sensitivity): 8 ng/L (ref <18)

## 2019-06-01 LAB — BRAIN NATRIURETIC PEPTIDE: B Natriuretic Peptide: 359 pg/mL — ABNORMAL HIGH (ref 0.0–100.0)

## 2019-06-01 LAB — LACTIC ACID, PLASMA
Lactic Acid, Venous: 1.3 mmol/L (ref 0.5–1.9)
Lactic Acid, Venous: 0.9 mmol/L (ref 0.5–1.9)

## 2019-06-01 LAB — SARS CORONAVIRUS 2 BY RT PCR (HOSPITAL ORDER, PERFORMED IN ~~LOC~~ HOSPITAL LAB): SARS Coronavirus 2: NEGATIVE

## 2019-06-01 MED ORDER — ACETAMINOPHEN 650 MG RE SUPP: 650.0000 mg | Freq: Four times a day (QID) | RECTAL | Status: DC | PRN

## 2019-06-01 MED ORDER — B COMPLEX-C PO TABS
1.0000 | ORAL_TABLET | Freq: Every day | ORAL | Status: DC
  Administered 2019-06-02 – 2019-06-03 (×2): 1 via ORAL
  Filled 2019-06-01 (×2): qty 1

## 2019-06-01 MED ORDER — HYDROXYUREA 500 MG PO CAPS
500.0000 mg | ORAL_CAPSULE | Freq: Every day | ORAL | Status: DC
  Administered 2019-06-02 – 2019-06-03 (×2): 500 mg via ORAL
  Filled 2019-06-01 (×2): qty 1

## 2019-06-01 MED ORDER — METOPROLOL SUCCINATE ER 100 MG PO TB24
100.0000 mg | ORAL_TABLET | Freq: Every day | ORAL | Status: DC
  Administered 2019-06-01 – 2019-06-03 (×3): 100 mg via ORAL
  Filled 2019-06-01 (×3): qty 1

## 2019-06-01 MED ORDER — CALCIUM CARBONATE-VITAMIN D 500-200 MG-UNIT PO TABS
1.0000 | ORAL_TABLET | Freq: Every day | ORAL | Status: DC
  Administered 2019-06-02 – 2019-06-03 (×2): 1 via ORAL
  Filled 2019-06-01 (×2): qty 1

## 2019-06-01 MED ORDER — HYDROCHLOROTHIAZIDE 25 MG PO TABS
25.0000 mg | ORAL_TABLET | Freq: Every day | ORAL | Status: DC
  Administered 2019-06-01 – 2019-06-03 (×3): 25 mg via ORAL
  Filled 2019-06-01 (×3): qty 1

## 2019-06-01 MED ORDER — FUROSEMIDE 10 MG/ML IJ SOLN
80.0000 mg | Freq: Once | INTRAMUSCULAR | Status: AC
  Administered 2019-06-01: 80 mg via INTRAVENOUS
  Filled 2019-06-01: qty 8

## 2019-06-01 MED ORDER — FLUTICASONE PROPIONATE 50 MCG/ACT NA SUSP
2.0000 | Freq: Every day | NASAL | Status: DC
  Administered 2019-06-02 – 2019-06-03 (×2): 2 via NASAL
  Filled 2019-06-01: qty 16

## 2019-06-01 MED ORDER — ATORVASTATIN CALCIUM 20 MG PO TABS
40.0000 mg | ORAL_TABLET | Freq: Every day | ORAL | Status: DC
  Administered 2019-06-02 – 2019-06-03 (×2): 40 mg via ORAL
  Filled 2019-06-01 (×2): qty 2

## 2019-06-01 MED ORDER — UMECLIDINIUM BROMIDE 62.5 MCG/INH IN AEPB
1.0000 | INHALATION_SPRAY | Freq: Every day | RESPIRATORY_TRACT | Status: DC
  Administered 2019-06-02 – 2019-06-03 (×2): 1 via RESPIRATORY_TRACT
  Filled 2019-06-01: qty 7

## 2019-06-01 MED ORDER — VALSARTAN-HYDROCHLOROTHIAZIDE 320-25 MG PO TABS: 1.0000 | ORAL_TABLET | Freq: Every day | ORAL | Status: DC

## 2019-06-01 MED ORDER — MONTELUKAST SODIUM 10 MG PO TABS
10.0000 mg | ORAL_TABLET | Freq: Every day | ORAL | Status: DC
  Administered 2019-06-02 – 2019-06-03 (×2): 10 mg via ORAL
  Filled 2019-06-01 (×2): qty 1

## 2019-06-01 MED ORDER — FLUTICASONE-UMECLIDIN-VILANT 100-62.5-25 MCG/INH IN AEPB: 1.0000 | INHALATION_SPRAY | Freq: Every day | RESPIRATORY_TRACT | Status: DC

## 2019-06-01 MED ORDER — VITAMIN C 500 MG PO TABS
500.0000 mg | ORAL_TABLET | Freq: Two times a day (BID) | ORAL | Status: DC
  Administered 2019-06-01 – 2019-06-03 (×4): 500 mg via ORAL
  Filled 2019-06-01 (×4): qty 1

## 2019-06-01 MED ORDER — INSULIN ASPART 100 UNIT/ML ~~LOC~~ SOLN
0.0000 [IU] | Freq: Three times a day (TID) | SUBCUTANEOUS | Status: DC
  Administered 2019-06-02: 2 [IU] via SUBCUTANEOUS
  Administered 2019-06-02 (×2): 1 [IU] via SUBCUTANEOUS
  Administered 2019-06-03: 12:00:00 2 [IU] via SUBCUTANEOUS
  Filled 2019-06-01 (×5): qty 1

## 2019-06-01 MED ORDER — POLYETHYLENE GLYCOL 3350 17 G PO PACK: 17.0000 g | PACK | Freq: Every day | ORAL | Status: DC | PRN

## 2019-06-01 MED ORDER — HYDRALAZINE HCL 50 MG PO TABS
50.0000 mg | ORAL_TABLET | Freq: Three times a day (TID) | ORAL | Status: DC
  Administered 2019-06-01 – 2019-06-03 (×6): 50 mg via ORAL
  Filled 2019-06-01 (×6): qty 1

## 2019-06-01 MED ORDER — ONDANSETRON HCL 4 MG/2ML IJ SOLN: 4.0000 mg | Freq: Four times a day (QID) | INTRAMUSCULAR | Status: DC | PRN

## 2019-06-01 MED ORDER — INSULIN ASPART 100 UNIT/ML ~~LOC~~ SOLN
0.0000 [IU] | Freq: Every day | SUBCUTANEOUS | Status: DC
  Administered 2019-06-01: 4 [IU] via SUBCUTANEOUS
  Filled 2019-06-01: qty 1

## 2019-06-01 MED ORDER — IPRATROPIUM-ALBUTEROL 0.5-2.5 (3) MG/3ML IN SOLN
3.0000 mL | Freq: Once | RESPIRATORY_TRACT | Status: AC
  Administered 2019-06-01: 11:00:00 3 mL via RESPIRATORY_TRACT
  Filled 2019-06-01: qty 3

## 2019-06-01 MED ORDER — FUROSEMIDE 10 MG/ML IJ SOLN
40.0000 mg | Freq: Every day | INTRAMUSCULAR | Status: DC
  Administered 2019-06-02 – 2019-06-03 (×2): 40 mg via INTRAVENOUS
  Filled 2019-06-01 (×2): qty 4

## 2019-06-01 MED ORDER — FLUTICASONE FUROATE-VILANTEROL 100-25 MCG/INH IN AEPB
1.0000 | INHALATION_SPRAY | Freq: Every day | RESPIRATORY_TRACT | Status: DC
  Administered 2019-06-02 – 2019-06-03 (×2): 1 via RESPIRATORY_TRACT
  Filled 2019-06-01: qty 28

## 2019-06-01 MED ORDER — ADULT MULTIVITAMIN W/MINERALS CH
1.0000 | ORAL_TABLET | Freq: Every day | ORAL | Status: DC
  Administered 2019-06-02 – 2019-06-03 (×2): 1 via ORAL
  Filled 2019-06-01 (×2): qty 1

## 2019-06-01 MED ORDER — ALBUTEROL SULFATE 1.25 MG/3ML IN NEBU: 1.0000 | INHALATION_SOLUTION | Freq: Four times a day (QID) | RESPIRATORY_TRACT | Status: DC | PRN

## 2019-06-01 MED ORDER — ACETAMINOPHEN 325 MG PO TABS: 650.0000 mg | ORAL_TABLET | Freq: Four times a day (QID) | ORAL | Status: DC | PRN

## 2019-06-01 MED ORDER — CLONIDINE HCL 0.1 MG PO TABS
0.2000 mg | ORAL_TABLET | Freq: Two times a day (BID) | ORAL | Status: DC
  Administered 2019-06-01 – 2019-06-03 (×4): 0.2 mg via ORAL
  Filled 2019-06-01 (×4): qty 2

## 2019-06-01 MED ORDER — LORAZEPAM 2 MG/ML IJ SOLN
1.0000 mg | Freq: Once | INTRAMUSCULAR | Status: AC
  Administered 2019-06-01: 1 mg via INTRAVENOUS
  Filled 2019-06-01: qty 1

## 2019-06-01 MED ORDER — IRBESARTAN 150 MG PO TABS
300.0000 mg | ORAL_TABLET | Freq: Every day | ORAL | Status: DC
  Administered 2019-06-01 – 2019-06-03 (×3): 300 mg via ORAL
  Filled 2019-06-01 (×4): qty 2

## 2019-06-01 MED ORDER — IPRATROPIUM-ALBUTEROL 0.5-2.5 (3) MG/3ML IN SOLN: 3.0000 mL | Freq: Four times a day (QID) | RESPIRATORY_TRACT | Status: DC | PRN

## 2019-06-01 MED ORDER — APIXABAN 5 MG PO TABS
5.0000 mg | ORAL_TABLET | Freq: Two times a day (BID) | ORAL | Status: DC
  Administered 2019-06-01 – 2019-06-03 (×4): 5 mg via ORAL
  Filled 2019-06-01 (×4): qty 1

## 2019-06-01 MED ORDER — VITAMIN B COMPLEX PO TABS: ORAL_TABLET | Freq: Every day | ORAL | Status: DC

## 2019-06-01 MED ORDER — SODIUM CHLORIDE 0.9% FLUSH
3.0000 mL | Freq: Two times a day (BID) | INTRAVENOUS | Status: DC
  Administered 2019-06-01 – 2019-06-03 (×5): 3 mL via INTRAVENOUS

## 2019-06-01 MED ORDER — METHYLPREDNISOLONE SODIUM SUCC 125 MG IJ SOLR
125.0000 mg | Freq: Once | INTRAMUSCULAR | Status: AC
  Administered 2019-06-01: 125 mg via INTRAVENOUS
  Filled 2019-06-01: qty 2

## 2019-06-01 MED ORDER — METHYLPREDNISOLONE SODIUM SUCC 125 MG IJ SOLR
60.0000 mg | Freq: Two times a day (BID) | INTRAMUSCULAR | Status: DC
  Administered 2019-06-02: 60 mg via INTRAVENOUS
  Filled 2019-06-01: qty 2

## 2019-06-01 MED ORDER — TRAZODONE HCL 50 MG PO TABS
50.0000 mg | ORAL_TABLET | Freq: Every evening | ORAL | Status: DC | PRN
  Administered 2019-06-01: 50 mg via ORAL
  Filled 2019-06-01: qty 1

## 2019-06-01 MED ORDER — ASPIRIN EC 81 MG PO TBEC
81.0000 mg | DELAYED_RELEASE_TABLET | Freq: Every day | ORAL | Status: DC
  Administered 2019-06-01 – 2019-06-03 (×3): 81 mg via ORAL
  Filled 2019-06-01 (×3): qty 1

## 2019-06-01 MED ORDER — ONDANSETRON HCL 4 MG PO TABS: 4.0000 mg | ORAL_TABLET | Freq: Four times a day (QID) | ORAL | Status: DC | PRN

## 2019-06-01 MED ORDER — LEVETIRACETAM 750 MG PO TABS
750.0000 mg | ORAL_TABLET | Freq: Two times a day (BID) | ORAL | Status: DC
  Administered 2019-06-01 – 2019-06-03 (×4): 750 mg via ORAL
  Filled 2019-06-01 (×5): qty 1

## 2019-06-01 MED ORDER — NIFEDIPINE ER OSMOTIC RELEASE 30 MG PO TB24
30.0000 mg | ORAL_TABLET | Freq: Every day | ORAL | Status: DC
  Administered 2019-06-02 – 2019-06-03 (×2): 30 mg via ORAL
  Filled 2019-06-01 (×3): qty 1

## 2019-06-01 NOTE — ED Notes (Signed)
SPO2 sensor changed to patient's forehead. O2 sats 100% on Bipap.

## 2019-06-01 NOTE — ED Notes (Signed)
Pt placed on 5L via [place] per MD.

## 2019-06-01 NOTE — H&P (Signed)
[facility] [place] NAME: Angelica Juarez    MR#:  [id number]  DATE OF BIRTH:  10/11/1941  DATE OF ADMISSION:  06/01/2019  PRIMARY CARE PHYSICIAN: Sofie Hartigan, MD   REQUESTING/REFERRING PHYSICIAN: Arta Silence, MD  CHIEF COMPLAINT:   Chief Complaint  Patient presents with  . Shortness of Breath    HISTORY OF PRESENT ILLNESS:  Angelica Juarez  is a 78 y.o. female with a known history of hypertension, hyperlipidemia, COPD, type 2 diabetes, CKD, history of seizures, OSA on CPAP who presented to the ED with shortness of breath over the last couple days.  Patient states that she has had periods of significant shortness of breath in the past, mostly related to the weather and her allergies.  She notes that the shortness of breath is worse with ambulation.  She denies any chest pain.  She endorses cough productive of yellow sputum.  She denies any fevers or chills.  She endorses chronic orthopnea.  On arrival to the ED, she had markedly increased work of breathing and was placed on BiPAP for approximately 2 hours.  She was then transitioned to 5 L nasal cannula and was maintaining her O2 sats greater than 92%.  Labs were significant for BNP 395, WBC 10.6.  UA was unremarkable.  Chest x-ray with mild interstitial edema and small bilateral pleural effusions.  She was given a dose of Lasix and a dose of IV Solu-Medrol.  Hospitalists were called for admission.  PAST MEDICAL HISTORY:   Past Medical History:  Diagnosis Date  . Allergy    Seasonal  . Arthritis   . 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 Chronic kidney disease    chronic renal insufficiency  . Colon polyps 05/21/14  . COPD (chronic obstructive pulmonary disease) ([place])   . Diabetes mellitus without complication ([place])   . Dyspnea   . Fracture closed, humerus, shaft    right   . History of kidney stones    40 years ago  . Hyperlipidemia 04/30/14  . Hypertension   .  Impingement syndrome of right shoulder 12/29/15  . Personal history of radiation therapy   . Pneumonia    hx  . Rotator cuff tear 04/21/16   right  . Seizures ([place])   . Sleep apnea     PAST SURGICAL HISTORY:   Past Surgical History:  Procedure Laterality Date  . ABDOMINAL HYSTERECTOMY    . 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 EYE SURGERY     bilateral cataracts  . JOINT REPLACEMENT     bilateral knee replacement  . NASAL SEPTUM SURGERY    . OOPHORECTOMY    . REPLACEMENT TOTAL KNEE Bilateral 2004  . SHOULDER ARTHROSCOPY WITH BICEPSTENOTOMY Right 05/25/2016   Procedure: SHOULDER ARTHROSCOPY WITH BICEPSTENOTOMY;  Surgeon: Leanor Kail, MD;  Location: [facility] ORS;  Service: Orthopedics;  Laterality: Right;  . SHOULDER ARTHROSCOPY WITH DISTAL CLAVICLE RESECTION Right 05/25/2016   Procedure: SHOULDER ARTHROSCOPY WITH DISTAL CLAVICLE RESECTION;  Surgeon: Leanor Kail, MD;  Location: [facility] ORS;  Service: Orthopedics;  Laterality: Right;  . SHOULDER ARTHROSCOPY WITH OPEN ROTATOR CUFF REPAIR Right 05/25/2016   Procedure: SHOULDER ARTHROSCOPY WITH OPEN ROTATOR CUFF REPAIR;  Surgeon: Leanor Kail, MD;  Location: [facility] ORS;  Service: Orthopedics;  Laterality: Right;  . SUBACROMIAL DECOMPRESSION Right 05/25/2016   Procedure: SUBACROMIAL DECOMPRESSION;  Surgeon: Leanor Kail, MD;  Location: [facility] ORS;  Service: Orthopedics;  Laterality: Right;    SOCIAL HISTORY:   Social History   Tobacco Use  . Smoking status: Former Smoker    Packs/day: 1.00    Years: 40.00    Pack years: 40.00    Quit date: 05/12/2005    Years since quitting: 14.0  . Smokeless tobacco: Never Used  Substance Use Topics  . Alcohol use: No    FAMILY HISTORY:   Family History  Problem Relation Age of Onset  . Breast cancer Paternal Aunt 33  . Diabetes Father   . Heart disease Father     DRUG ALLERGIES:   Allergies  Allergen Reactions  . Iodinated Diagnostic Agents Anaphylaxis    Other  reaction(s): Other (See Comments) Throat swells and extreme hives  . Latex Itching  . Phenobarbital Hives  . Tape Rash    silicones    REVIEW OF SYSTEMS:   Review of Systems  Constitutional: Negative for chills and fever.  HENT: Negative for congestion and sore throat.   Eyes: Negative for blurred vision and double vision.  Respiratory: Positive for cough, sputum production and shortness of breath.   Cardiovascular: Positive for orthopnea and leg swelling. Negative for chest pain and palpitations.  Gastrointestinal: Negative for nausea and vomiting.  Genitourinary: Negative for dysuria and urgency.  Musculoskeletal: Negative for back pain and neck pain.  Neurological: Negative for dizziness and headaches.  Psychiatric/Behavioral: Negative for depression. The patient is not nervous/anxious.     MEDICATIONS AT HOME:   Prior to Admission medications   Medication Sig Start Date End Date Taking? Authorizing Provider  albuterol (ACCUNEB) 1.25 MG/3ML nebulizer solution Take 3 mLs (1.25 mg total) by nebulization every 6 (six) hours as needed. wheezing 05/03/19   Allyne Gee, MD  aspirin 81 MG tablet Take 81 mg by mouth daily.    [provider]  atorvastatin (LIPITOR) 40 MG tablet Take 40 mg by mouth daily. 04/04/16   [provider]  B Complex Vitamins (VITAMIN B COMPLEX PO) Take 1 tablet by mouth daily.    [provider]  Calcium Carbonate-Vitamin D 600-200 MG-UNIT CAPS Take 1 capsule by mouth daily.    [provider]  cloNIDine (CATAPRES) 0.2 MG tablet Take 0.2 mg by mouth 2 (two) times daily.    [provider]  diphenhydrAMINE (BENADRYL) 25 mg capsule Take 25 mg by mouth daily.    [provider]  EPINEPHrine 0.3 mg/0.3 mL IJ SOAJ injection INJ 0.3 ML IM ONCE FOR 1 DOSE 01/24/19   [provider]  fluticasone (FLONASE) 50 MCG/ACT nasal spray Place 2 sprays into both nostrils daily. 04/03/19   Ronnell Freshwater, NP   Fluticasone-Umeclidin-Vilant (TRELEGY ELLIPTA) 100-62.5-25 MCG/INH AEPB Inhale 1 puff into the lungs daily. 08/16/18   Allyne Gee, MD  furosemide (LASIX) 20 MG tablet Take 1 tablet (20 mg total) by mouth 2 (two) times daily. 08/08/16   Demetrios Loll, MD  glucose blood (ONE TOUCH ULTRA TEST) test strip USE ONE STRIP TO CHECK GLUCOSE ONCE DAILY 02/19/18   [provider]  hydrALAZINE (APRESOLINE) 100 MG tablet Take 50 mg by mouth 3 (three) times daily.  02/04/16   [provider]  hydroxyurea (HYDREA) 500 MG capsule Take 1 capsule (500 mg total) by mouth daily. May take with food to minimize GI side effects. 05/01/19   Lequita Asal, MD  levETIRAcetam (KEPPRA) 750 MG tablet Take 750 mg by mouth 2 (two) times daily. 03/14/16   [provider]  metFORMIN (GLUCOPHAGE) 500 MG tablet Take 500 mg by mouth 2 (two) times daily with a meal. 12/25/15   [provider]  metoprolol succinate (TOPROL-XL) 100 MG 24 hr tablet Take 100 mg by mouth daily. 06/25/18   [provider]  montelukast (SINGULAIR) 10 MG tablet TAKE 1 TABLET BY MOUTH EVERY DAY 07/02/15   Kathrine Haddock, NP  Multiple Vitamin (MULTI-VITAMINS) TABS Take 1 tablet by mouth daily.    [provider]  NIFEdipine (NIFEDICAL XL) 30 MG 24 hr tablet Take 30 mg by mouth daily. 09/30/15   [provider]  OVER THE COUNTER MEDICATION Place 1 drop into both eyes daily. Allergy eye drops    [provider]  sitaGLIPtin (JANUVIA) 25 MG tablet Take 1 tablet by mouth daily. 01/01/18   [provider]  traZODone (DESYREL) 50 MG tablet Take 50-100 mg by mouth at bedtime as needed. Sleep 04/27/16   [provider]  valsartan-hydrochlorothiazide (DIOVAN-HCT) 320-25 MG tablet Take 1 tablet by mouth daily. 04/14/16   [provider]  VENTOLIN HFA 108 (90 Base) MCG/ACT inhaler INHALE 2 PUFFS INTO THE LUNGS EVERY 6 HOURS AS NEEDED 02/18/19   Kendell Bane, NP  vitamin C (ASCORBIC  ACID) 500 MG tablet Take 500 mg by mouth 2 (two) times daily.    [provider]      VITAL SIGNS:  Blood pressure (!) 167/87, pulse 86, temperature 97.9 F (36.6 C), resp. rate (!) 30, height 5\' 7"  (1.702 m), weight 99.8 kg, SpO2 100 %.  PHYSICAL EXAMINATION:  Physical Exam  GENERAL:  78 y.o.-year-old patient lying in the bed with no acute distress.  EYES: Pupils equal, round, reactive to light and accommodation. No scleral icterus. Extraocular muscles intact.  HEENT: Head atraumatic, normocephalic. Oropharynx and nasopharynx clear.  NECK:  Supple, no jugular venous distention. No thyroid enlargement, no tenderness.  LUNGS: + Crackles present up to the midlung bilaterally, nasal cannula in place, mildly increased work of breathing. CARDIOVASCULAR: Tachycardic, irregularly irregular rhythm, S1, S2 normal. No murmurs, rubs, or gallops.  ABDOMEN: Soft, nontender, nondistended. Bowel sounds present. No organomegaly or mass.  EXTREMITIES: No cyanosis, or clubbing. 1-2+ pitting edema in the lower extremities bilaterally, right greater than left. NEUROLOGIC: Cranial nerves II through XII are intact. Muscle strength 5/5 in all extremities. Sensation intact. Gait not checked.  PSYCHIATRIC: The patient is alert and oriented x 3.  SKIN: No obvious rash, lesion, or ulcer.   LABORATORY PANEL:   CBC Recent Labs  Lab 06/01/19 1027  WBC 10.6*  HGB 14.1  HCT 42.9  PLT 326   ------------------------------------------------------------------------------------------------------------------  Chemistries  Recent Labs  Lab 06/01/19 1027  NA 142  K 4.0  CL 104  CO2 27  GLUCOSE 129*  BUN 15  CREATININE 0.90  CALCIUM 9.8  AST 31  ALT 32  ALKPHOS 76  BILITOT 1.3*   ------------------------------------------------------------------------------------------------------------------  Cardiac Enzymes No results for input(s): TROPONINI in the last 168 hours.  ------------------------------------------------------------------------------------------------------------------  RADIOLOGY:  Dg Chest Portable 1 View  Result Date: 06/01/2019 CLINICAL DATA:  Shortness of breath beginning yesterday. EXAM: PORTABLE CHEST 1 VIEW COMPARISON:  08/17/2016 FINDINGS: Cardiomegaly. Aortic atherosclerosis. Pulmonary venous hypertension with mild interstitial edema. Bilateral effusions with dependent atelectasis. IMPRESSION: Congestive heart failure. Cardiomegaly. Aortic atherosclerosis. Pulmonary venous hypertension and mild interstitial edema. Bilateral effusions and lower lung atelectasis. Electronically Signed   By: Nelson Chimes M.D.   On: 06/01/2019 11:02      IMPRESSION AND PLAN:  Acute hypoxic respiratory failure- likely secondary to acute on chronic diastolic congestive heart failure and COPD exacerbation.  Follows with Dr. Ubaldo Glassing as an outpatient. [facility] Cardiology consult -Continue IV diuresis -ECHO -Solu-Medrol IV -Continue home inhalers -DuoNebs PRN -Wean O2 as able -Would ambulate with pulse ox prior to discharge  New onset atrial fibrillation- noted on EKG on admission. HR is around 100. CHADSVASC is a 6. -Continue home metoprolol -Start Eliquis  [facility] Cardiology consult -Cardiac monitoring  Lower extremity edema- R > L.  Likely due to CHF exacerbation. -Check right lower extremity Doppler ultrasound to rule out DVT  Hypertension-blood pressures mildly elevated in the ED -Continue home clonidine, hydralazine, metoprolol, nifedipine, valsartan, HCTZ  Type 2 diabetes-stable -Sensitive SSI  Hyperlipidemia-stable -Continue home Lipitor  History of seizures-no recent seizure-like activity -Continue home Keppra  OSA- stable -CPAP nightly  All the records are reviewed and case discussed with ED provider. Management plans discussed with the patient, family and they are in agreement.  CODE STATUS: Full  TOTAL TIME TAKING CARE OF THIS  PATIENT: 45 minutes.    Berna Spare Normon Pettijohn M.D on 06/01/2019 at 2:46 PM  Between 7am to 6pm - Pager - [phone]  After 6pm go to [email or web address] - Proofreader  [facility]  Office  [phone]  CC: Primary care physician; Sofie Hartigan, MD   Note: This dictation was prepared with Dragon dictation along with smaller phrase technology. Any transcriptional errors that result from this process are unintentional.

## 2019-06-01 NOTE — ED Notes (Signed)
Per MD, trial patient off of BiPap, place on 5L via [place]. MD aware RR continues to be high.

## 2019-06-01 NOTE — ED Notes (Signed)
Admitting MD at bedside at this time. Pt remains 92-96% on 5L via [place] at this time. Pt is alert and oriented, appears comfortable. Purewick cannister emptied of urine. Will continue to monitor for further patient needs.

## 2019-06-01 NOTE — ED Notes (Signed)
No change in patient condition. Pt remains on 5L via [place]. Pt resting in bed with purewick in place, watching TV, lights dimmed for patient comfort, denies any needs at this time. Will continue to monitor for further patient needs.

## 2019-06-01 NOTE — ED Notes (Signed)
Pt given water at this time, pt used phone to call [facility] and update family.

## 2019-06-01 NOTE — ED Notes (Addendum)
ED TRIAGE NOTE:  Pt arrived via POV from [facility]. With reports of shortness of breath, pt states she has been short of breath for over a year, but states is worse over the past week. Pt states she has hx of COPD, does not use oxygen at home. Pt states she has not had any COVID contacts. Pt states she does have hx of CHF, pt has pitting edema in lower extremities,  Pt states she feels a tightness in her chest. Pt reports difficulty sleeping at night due to the shortness of breath, pt short of breath speaking in short sentences.   Increased DOE as well.  Using inhalers and nebs at home without relief.

## 2019-06-01 NOTE — Progress Notes (Signed)
Family Meeting Note  Advance Directive:yes  Today a meeting took place with the Patient.  Patient is able to participate.  The following clinical team members were present during this meeting:MD  The following were discussed:Patient's diagnosis: CHF exacerbation, Patient's progosis: Unable to determine and Goals for treatment: Full Code   Patient states that she has an advanced directive that says that she would want to undergo cardiopulmonary resuscitation, but she would not want any life-prolonging measures if there was a small chance of her improving.  Additional follow-up to be provided: prn  Time spent during discussion:20 minutes  Evette Doffing, MD

## 2019-06-01 NOTE — ED Notes (Signed)
Pt maintains 92-96% on 5L via [place]. Will continue to monitor.

## 2019-06-01 NOTE — ED Notes (Signed)
Pt continues to be on BiPap while in bed. Pt appears comfortable denies any needs. Will continue to monitor for further patient needs.

## 2019-06-01 NOTE — ED Provider Notes (Signed)
[facility] Emergency Department Provider Note ____________________________________________   First MD Initiated Contact with Patient 06/01/19 1012     (approximate)  I have reviewed the triage vital signs and the nursing notes.   HISTORY  Chief Complaint Shortness of Breath    HPI Angelica Juarez is a 78 y.o. female with PMH as noted below including COPD not on home O2 who presents with shortness of breath, gradual onset over the last week, worsening course, associated with mild cough which is chronic for her, but not associated with chest pain, fever, or any vomiting or diarrhea.  The patient states she has not gotten any relief from her inhalers and nebulizer at home.  Past Medical History:  Diagnosis Date  . Allergy    Seasonal  . Arthritis   . 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 Chronic kidney disease    chronic renal insufficiency  . Colon polyps 05/21/14  . COPD (chronic obstructive pulmonary disease) ([place])   . Diabetes mellitus without complication ([place])   . Dyspnea   . Fracture closed, humerus, shaft    right   . History of kidney stones    40 years ago  . Hyperlipidemia 04/30/14  . Hypertension   . Impingement syndrome of right shoulder 12/29/15  . Personal history of radiation therapy   . Pneumonia    hx  . Rotator cuff tear 04/21/16   right  . Seizures ([place])   . Sleep apnea     Patient Active Problem List   Diagnosis Date Noted  . C. difficile diarrhea 11/24/2018  . Diarrhea 11/18/2018  . Nausea without vomiting 11/18/2018  . Goals of care, counseling/discussion 09/12/2018  . 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 History of repair of right rotator cuff 08/25/2016  . Acute respiratory failure with hypoxia ([place]) 08/06/2016  . Nontraumatic tear of right rotator cuff 04/21/2016  . Atypical chest pain 04/07/2016  . Impingement syndrome of right shoulder 12/29/2015  . Primary osteoarthritis of first carpometacarpal  joint of left hand 10/27/2015  . Right shoulder pain 10/27/2015  . OSA on CPAP 10/14/2015  . COPD with asthma ([place]) 07/13/2015  . Type 2 diabetes mellitus with microalbuminuria, without long-term current use of insulin ([place]) 07/13/2015  . Essential hypertension 03/05/2015  . Difficulty sleeping 11/26/2014  . CRI (chronic renal insufficiency) 07/31/2014  . Seizure disorder ([place]) 07/31/2014  . History of colonic polyps 05/21/2014  . Hyperlipidemia, unspecified 04/30/2014    Past Surgical History:  Procedure Laterality Date  . ABDOMINAL HYSTERECTOMY    . 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 EYE SURGERY     bilateral cataracts  . JOINT REPLACEMENT     bilateral knee replacement  . NASAL SEPTUM SURGERY    . OOPHORECTOMY    . REPLACEMENT TOTAL KNEE Bilateral 2004  . SHOULDER ARTHROSCOPY WITH BICEPSTENOTOMY Right 05/25/2016   Procedure: SHOULDER ARTHROSCOPY WITH BICEPSTENOTOMY;  Surgeon: Leanor Kail, MD;  Location: [facility] ORS;  Service: Orthopedics;  Laterality: Right;  . SHOULDER ARTHROSCOPY WITH DISTAL CLAVICLE RESECTION Right 05/25/2016   Procedure: SHOULDER ARTHROSCOPY WITH DISTAL CLAVICLE RESECTION;  Surgeon: Leanor Kail, MD;  Location: [facility] ORS;  Service: Orthopedics;  Laterality: Right;  . SHOULDER ARTHROSCOPY WITH OPEN ROTATOR CUFF REPAIR Right 05/25/2016   Procedure: SHOULDER ARTHROSCOPY WITH OPEN ROTATOR CUFF REPAIR;  Surgeon: Leanor Kail, MD;  Location: [facility] ORS;  Service: Orthopedics;  Laterality: Right;  . SUBACROMIAL DECOMPRESSION Right 05/25/2016  Procedure: SUBACROMIAL DECOMPRESSION;  Surgeon: Leanor Kail, MD;  Location: [facility] ORS;  Service: Orthopedics;  Laterality: Right;    Prior to Admission medications   Medication Sig Juarez Date End Date Taking? Authorizing Provider  albuterol (ACCUNEB) 1.25 MG/3ML nebulizer solution Take 3 mLs (1.25 mg total) by nebulization every 6 (six) hours as needed. wheezing 05/03/19   Allyne Gee, MD  aspirin  81 MG tablet Take 81 mg by mouth daily.    [provider]  atorvastatin (LIPITOR) 40 MG tablet Take 40 mg by mouth daily. 04/04/16   [provider]  B Complex Vitamins (VITAMIN B COMPLEX PO) Take 1 tablet by mouth daily.    [provider]  Calcium Carbonate-Vitamin D 600-200 MG-UNIT CAPS Take 1 capsule by mouth daily.    [provider]  cloNIDine (CATAPRES) 0.2 MG tablet Take 0.2 mg by mouth 2 (two) times daily.    [provider]  diphenhydrAMINE (BENADRYL) 25 mg capsule Take 25 mg by mouth daily.    [provider]  EPINEPHrine 0.3 mg/0.3 mL IJ SOAJ injection INJ 0.3 ML IM ONCE FOR 1 DOSE 01/24/19   [provider]  fluticasone (FLONASE) 50 MCG/ACT nasal spray Place 2 sprays into both nostrils daily. 04/03/19   Ronnell Freshwater, NP  Fluticasone-Umeclidin-Vilant (TRELEGY ELLIPTA) 100-62.5-25 MCG/INH AEPB Inhale 1 puff into the lungs daily. 08/16/18   Allyne Gee, MD  furosemide (LASIX) 20 MG tablet Take 1 tablet (20 mg total) by mouth 2 (two) times daily. 08/08/16   Demetrios Loll, MD  glucose blood (ONE TOUCH ULTRA TEST) test strip USE ONE STRIP TO CHECK GLUCOSE ONCE DAILY 02/19/18   [provider]  hydrALAZINE (APRESOLINE) 100 MG tablet Take 50 mg by mouth 3 (three) times daily.  02/04/16   [provider]  hydroxyurea (HYDREA) 500 MG capsule Take 1 capsule (500 mg total) by mouth daily. May take with food to minimize GI side effects. 05/01/19   Lequita Asal, MD  levETIRAcetam (KEPPRA) 750 MG tablet Take 750 mg by mouth 2 (two) times daily. 03/14/16   [provider]  metFORMIN (GLUCOPHAGE) 500 MG tablet Take 500 mg by mouth 2 (two) times daily with a meal. 12/25/15   [provider]  metoprolol succinate (TOPROL-XL) 100 MG 24 hr tablet Take 100 mg by mouth daily. 06/25/18   [provider]  montelukast (SINGULAIR) 10 MG tablet TAKE 1 TABLET BY MOUTH EVERY DAY 07/02/15   Kathrine Haddock, NP   Multiple Vitamin (MULTI-VITAMINS) TABS Take 1 tablet by mouth daily.    [provider]  NIFEdipine (NIFEDICAL XL) 30 MG 24 hr tablet Take 30 mg by mouth daily. 09/30/15   [provider]  OVER THE COUNTER MEDICATION Place 1 drop into both eyes daily. Allergy eye drops    [provider]  sitaGLIPtin (JANUVIA) 25 MG tablet Take 1 tablet by mouth daily. 01/01/18   [provider]  traZODone (DESYREL) 50 MG tablet Take 50-100 mg by mouth at bedtime as needed. Sleep 04/27/16   [provider]  valsartan-hydrochlorothiazide (DIOVAN-HCT) 320-25 MG tablet Take 1 tablet by mouth daily. 04/14/16   [provider]  VENTOLIN HFA 108 (90 Base) MCG/ACT inhaler INHALE 2 PUFFS INTO THE LUNGS EVERY 6 HOURS AS NEEDED 02/18/19   Kendell Bane, NP  vitamin C (ASCORBIC ACID) 500 MG tablet Take 500 mg by mouth 2 (two) times daily.    [provider]    Allergies Iodinated  diagnostic agents, Latex, Phenobarbital, and Tape  Family History  Problem Relation Age of Onset  . Breast cancer Paternal Aunt 29  . Diabetes Father   . Heart disease Father     Social History Social History   Tobacco Use  . Smoking status: Former Smoker    Packs/day: 1.00    Years: 40.00    Pack years: 40.00    Quit date: 05/12/2005    Years since quitting: 14.0  . Smokeless tobacco: Never Used  Substance Use Topics  . Alcohol use: No  . Drug use: No    Review of Systems  Constitutional: No fever. Eyes: No redness. ENT: No sore throat. Cardiovascular: Denies chest pain. Respiratory: Positive for shortness of breath. Gastrointestinal: No vomiting or diarrhea.  Genitourinary: Negative for flank pain. Musculoskeletal: Negative for back pain. Skin: Negative for rash. Neurological: Negative for headache.   ____________________________________________   PHYSICAL EXAM:  VITAL SIGNS: ED Triage Vitals  Enc Vitals Group     BP 06/01/19 1017 116/79     Pulse  Rate 06/01/19 0959 79     Resp 06/01/19 0959 20     Temp 06/01/19 0959 97.9 F (36.6 C)     Temp src --      SpO2 06/01/19 0959 94 %     Weight 06/01/19 1025 220 lb (99.8 kg)     Height 06/01/19 1025 5\' 7"  (1.702 m)     Head Circumference --      Peak Flow --      Pain Score 06/01/19 1032 0     Pain Loc --      Pain Edu? --      Excl. in [place]? --     Constitutional: Alert and oriented.  Uncomfortable appearing, speaking in short sentences.   Eyes: Conjunctivae are normal.  Head: Atraumatic. Nose: No congestion/rhinnorhea. Mouth/Throat: Mucous membranes are moist.   Neck: Normal range of motion.  Cardiovascular: Good peripheral circulation. Respiratory: Increased respiratory effort. Gastrointestinal: No distention.  Musculoskeletal: 1+ bilateral lower extremity edema.  No calf or popliteal swelling or tenderness.  Extremities warm and well perfused.  Neurologic:  Normal speech and language. No gross focal neurologic deficits are appreciated.  Skin:  Skin is warm and dry. No rash noted. Psychiatric: Mood and affect are normal. Speech and behavior are normal.  ____________________________________________   LABS (all labs ordered are listed, but only abnormal results are displayed)  Labs Reviewed  COMPREHENSIVE METABOLIC PANEL - Abnormal; Notable for the following components:      Result Value   Glucose, Bld 129 (*)    Total Bilirubin 1.3 (*)    All other components within normal limits  CBC WITH DIFFERENTIAL/PLATELET - Abnormal; Notable for the following components:   WBC 10.6 (*)    MCV 102.4 (*)    Neutro Abs 8.9 (*)    Lymphs Abs 0.4 (*)    Abs Immature Granulocytes 0.09 (*)    All other components within normal limits  BRAIN NATRIURETIC PEPTIDE - Abnormal; Notable for the following components:   B Natriuretic Peptide 359.0 (*)    All other components within normal limits  URINALYSIS, COMPLETE (UACMP) WITH MICROSCOPIC - Abnormal; Notable for the following components:    Color, Urine YELLOW (*)    APPearance HAZY (*)    Protein, ur 30 (*)    Leukocytes,Ua TRACE (*)    All other components within normal limits  SARS CORONAVIRUS 2 (HOSPITAL ORDER, [place] [facility])  LACTIC ACID, PLASMA  LACTIC ACID, PLASMA  TROPONIN I (HIGH SENSITIVITY)  TROPONIN I (HIGH SENSITIVITY)   ____________________________________________  EKG  ED ECG REPORT I, Arta Silence, the attending physician, personally viewed and interpreted this ECG.  Date: 06/01/2019 EKG Time: 1030 Rate: 80 Rhythm: Atrial fibrillation QRS Axis: normal Intervals: normal ST/T Wave abnormalities: Nonspecific ST abnormalities Narrative Interpretation: no evidence of acute ischemia  ____________________________________________  RADIOLOGY  CXR: Cardiomegaly and interstitial edema  ____________________________________________   PROCEDURES  Procedure(s) performed: No  Procedures  Critical Care performed: Yes  CRITICAL CARE Performed by: Arta Silence   Total critical care time: 45 minutes  Critical care time was exclusive of separately billable procedures and treating other patients.  Critical care was necessary to treat or prevent imminent or life-threatening deterioration.  Critical care was time spent personally by me on the following activities: development of treatment plan with patient and/or surrogate as well as nursing, discussions with consultants, evaluation of patient's response to treatment, examination of patient, obtaining history from patient or surrogate, ordering and performing treatments and interventions, ordering and review of laboratory studies, ordering and review of radiographic studies, pulse oximetry and re-evaluation of patient's condition. ____________________________________________   INITIAL IMPRESSION / ASSESSMENT AND PLAN / ED COURSE  Pertinent labs & imaging results that were available during my care of the patient  were reviewed by me and considered in my medical decision making (see chart for details).  78 year old female with a history of COPD and other PMH as noted above presents with worsening shortness of breath over the last week not associated with fever or chest pain.  She has lower extremity swelling which she reports she has had intermittently in the past.  On exam, the patient has increased work of breathing and is speaking in short sentences.  O2 saturation on room air was in the high 80s and she is in the high 90s on nasal cannula.  The remainder of the exam is as described above.  EKG shows no ischemic findings but does appear consistent with atrial fibrillation which would be new for the patient.  Differential includes COPD exacerbation, new onset CHF, pneumonia, bronchitis.  We will obtain chest x-ray, lab work-up, and reassess.  I will treat presumptively for COPD with Solu-Medrol and duo nebs.  Due to the increased work of breathing I will do a trial of BiPAP as well.  ----------------------------------------- 2:55 PM on 06/01/2019 -----------------------------------------  The patient did well with BiPAP.  After approximately 2 hours we took her off of it and she is now tolerating O2 by nasal cannula with good oxygenation and no respiratory distress.  Work-up is most consistent with CHF.  I treated with IV Lasix.  The patient will need admission for further management since she was hypoxic.  I signed her out to the hospitalist Dr. Brett Albino.  ________________________________  Sander Nephew was evaluated in Emergency Department on 06/01/2019 for the symptoms described in the history of present illness. She was evaluated in the context of the global COVID-19 pandemic, which necessitated consideration that the patient might be at risk for infection with the SARS-CoV-2 virus that causes COVID-19. Institutional protocols and algorithms that pertain to the evaluation of patients at risk for COVID-19 are  in a state of rapid change based on information released by regulatory bodies including the CDC and federal and state organizations. These policies and algorithms were followed during the patient's care in the ED.  ____________________________________________   FINAL CLINICAL IMPRESSION(S) / ED DIAGNOSES  Final diagnoses:  Acute on chronic respiratory failure with hypoxia (HCC)  Acute on chronic congestive heart failure, unspecified heart failure type ([place])      NEW MEDICATIONS STARTED DURING THIS VISIT:  New Prescriptions   No medications on file     Note:  This document was prepared using Dragon voice recognition software and may include unintentional dictation errors.    Arta Silence, MD 06/01/19 [phone]

## 2019-06-01 NOTE — ED Notes (Signed)
ED TO INPATIENT HANDOFF REPORT  ED Nurse Name and Phone #:  Jinny Blossom [place]  S Name/Age/Gender Angelica Juarez 78 y.o. female Room/Bed: ED19A/ED19A  Code Status   Code Status: Prior  Home/SNF/Other Home Patient oriented to: self, place, time and situation Is this baseline? Yes   Triage Complete: Triage complete  Chief Complaint poled in r eye pain  Triage Note No notes on file   Allergies Allergies  Allergen Reactions  . Iodinated Diagnostic Agents Anaphylaxis    Other reaction(s): Other (See Comments) Throat swells and extreme hives  . Latex Itching  . Phenobarbital Hives  . Tape Rash    silicones    Level of Care/Admitting Diagnosis ED Disposition    ED Disposition Condition Comment   Admit  The patient appears reasonably stabilized for admission considering the current resources, flow, and capabilities available in the ED at this time, and I doubt any other [facility] requiring further screening and/or treatment in the ED prior to admission is  present.       B Medical/Surgery History Past Medical History:  Diagnosis Date  . Allergy    Seasonal  . Arthritis   . 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 Chronic kidney disease    chronic renal insufficiency  . Colon polyps 05/21/14  . COPD (chronic obstructive pulmonary disease) ([place])   . Diabetes mellitus without complication ([place])   . Dyspnea   . Fracture closed, humerus, shaft    right   . History of kidney stones    40 years ago  . Hyperlipidemia 04/30/14  . Hypertension   . Impingement syndrome of right shoulder 12/29/15  . Personal history of radiation therapy   . Pneumonia    hx  . Rotator cuff tear 04/21/16   right  . Seizures ([place])   . Sleep apnea    Past Surgical History:  Procedure Laterality Date  . ABDOMINAL HYSTERECTOMY    . 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 EYE SURGERY     bilateral cataracts  . JOINT REPLACEMENT     bilateral knee replacement  . NASAL  SEPTUM SURGERY    . OOPHORECTOMY    . REPLACEMENT TOTAL KNEE Bilateral 2004  . SHOULDER ARTHROSCOPY WITH BICEPSTENOTOMY Right 05/25/2016   Procedure: SHOULDER ARTHROSCOPY WITH BICEPSTENOTOMY;  Surgeon: Leanor Kail, MD;  Location: [facility] ORS;  Service: Orthopedics;  Laterality: Right;  . SHOULDER ARTHROSCOPY WITH DISTAL CLAVICLE RESECTION Right 05/25/2016   Procedure: SHOULDER ARTHROSCOPY WITH DISTAL CLAVICLE RESECTION;  Surgeon: Leanor Kail, MD;  Location: [facility] ORS;  Service: Orthopedics;  Laterality: Right;  . SHOULDER ARTHROSCOPY WITH OPEN ROTATOR CUFF REPAIR Right 05/25/2016   Procedure: SHOULDER ARTHROSCOPY WITH OPEN ROTATOR CUFF REPAIR;  Surgeon: Leanor Kail, MD;  Location: [facility] ORS;  Service: Orthopedics;  Laterality: Right;  . SUBACROMIAL DECOMPRESSION Right 05/25/2016   Procedure: SUBACROMIAL DECOMPRESSION;  Surgeon: Leanor Kail, MD;  Location: [facility] ORS;  Service: Orthopedics;  Laterality: Right;     A IV Location/Drains/Wounds Patient Lines/Drains/Airways Status   Active Line/Drains/Airways    Name:   Placement date:   Placement time:   Site:   Days:   Peripheral IV 06/01/19 Left Antecubital   06/01/19    1035    Antecubital   less than 1   Peripheral IV 06/01/19 Left Forearm   06/01/19    1150    Forearm   less than 1   Incision (Closed) 05/25/16 Shoulder Right  05/25/16    1050     1102          Intake/Output Last 24 hours  Intake/Output Summary (Last 24 hours) at 06/01/2019 1523 Last data filed at 06/01/2019 1509 Gross per 24 hour  Intake -  Output 1700 ml  Net -1700 ml    Labs/Imaging Results for orders placed or performed during the hospital encounter of 06/01/19 (from the past 48 hour(s))  Comprehensive metabolic panel     Status: Abnormal   Collection Time: 06/01/19 10:27 AM  Result Value Ref Range   Sodium 142 135 - 145 mmol/L   Potassium 4.0 3.5 - 5.1 mmol/L   Chloride 104 98 - 111 mmol/L   CO2 27 22 - 32 mmol/L   Glucose, Bld 129 (H) 70 - 99  mg/dL   BUN 15 8 - 23 mg/dL   Creatinine, Ser 0.90 0.44 - 1.00 mg/dL   Calcium 9.8 8.9 - 10.3 mg/dL   Total Protein 6.9 6.5 - 8.1 g/dL   Albumin 4.4 3.5 - 5.0 g/dL   AST 31 15 - 41 U/L   ALT 32 0 - 44 U/L   Alkaline Phosphatase 76 38 - 126 U/L   Total Bilirubin 1.3 (H) 0.3 - 1.2 mg/dL   GFR calc non Af Amer >60 >60 mL/min   GFR calc Af Amer >60 >60 mL/min   Anion gap 11 5 - 15    Comment: Performed at [facility], [address] [place], [place] [address]  CBC with Differential     Status: Abnormal   Collection Time: 06/01/19 10:27 AM  Result Value Ref Range   WBC 10.6 (H) 4.0 - 10.5 K/uL   RBC 4.19 3.87 - 5.11 MIL/uL   Hemoglobin 14.1 12.0 - 15.0 g/dL   HCT 42.9 36.0 - 46.0 %   MCV 102.4 (H) 80.0 - 100.0 fL   MCH 33.7 26.0 - 34.0 pg   MCHC 32.9 30.0 - 36.0 g/dL   RDW 14.3 11.5 - 15.5 %   Platelets 326 150 - 400 K/uL   nRBC 0.0 0.0 - 0.2 %   Neutrophils Relative % 84 %   Neutro Abs 8.9 (H) 1.7 - 7.7 K/uL   Lymphocytes Relative 4 %   Lymphs Abs 0.4 (L) 0.7 - 4.0 K/uL   Monocytes Relative 7 %   Monocytes Absolute 0.8 0.1 - 1.0 K/uL   Eosinophils Relative 3 %   Eosinophils Absolute 0.3 0.0 - 0.5 K/uL   Basophils Relative 1 %   Basophils Absolute 0.1 0.0 - 0.1 K/uL   Immature Granulocytes 1 %   Abs Immature Granulocytes 0.09 (H) 0.00 - 0.07 K/uL    Comment: Performed at [facility], [place], [place] [address]  Troponin I (High Sensitivity)     Status: None   Collection Time: 06/01/19 10:27 AM  Result Value Ref Range   Troponin I (High Sensitivity) 9 <18 ng/L    Comment: (NOTE) Elevated high sensitivity troponin I (hsTnI) values and significant  changes across serial measurements may suggest ACS but many other  chronic and acute conditions are known to elevate hsTnI results.  Refer to the "Links" section for chest pain algorithms and additional  guidance. Performed at [facility], [address] [place], [place]  [address]   Brain natriuretic peptide     Status: Abnormal   Collection Time: 06/01/19 10:28 AM  Result Value Ref Range   B Natriuretic Peptide 359.0 (H) 0.0 -  100.0 pg/mL    Comment: Performed at [facility], [address] [place], [place] [address]  Lactic acid, plasma     Status: None   Collection Time: 06/01/19 10:46 AM  Result Value Ref Range   Lactic Acid, Venous 1.3 0.5 - 1.9 mmol/L    Comment: Performed at [facility], [address] [place], [place] [address]  Urinalysis, Complete w Microscopic     Status: Abnormal   Collection Time: 06/01/19 10:46 AM  Result Value Ref Range   Color, Urine YELLOW (A) YELLOW   APPearance HAZY (A) CLEAR   Specific Gravity, Urine 1.006 1.005 - 1.030   pH 6.0 5.0 - 8.0   Glucose, UA NEGATIVE NEGATIVE mg/dL   Hgb urine dipstick NEGATIVE NEGATIVE   Bilirubin Urine NEGATIVE NEGATIVE   Ketones, ur NEGATIVE NEGATIVE mg/dL   Protein, ur 30 (A) NEGATIVE mg/dL   Nitrite NEGATIVE NEGATIVE   Leukocytes,Ua TRACE (A) NEGATIVE   RBC / HPF 0-5 0 - 5 RBC/hpf   WBC, UA 0-5 0 - 5 WBC/hpf   Bacteria, UA NONE SEEN NONE SEEN   Squamous Epithelial / LPF 0-5 0 - 5   Mucus PRESENT     Comment: Performed at [facility], [address] [place], [place] [address]  SARS Coronavirus 2 (CEPHEID - Performed in [place] hospital lab), Hosp Order     Status: None   Collection Time: 06/01/19 10:46 AM   Specimen: Nasopharyngeal Swab  Result Value Ref Range   SARS Coronavirus 2 NEGATIVE NEGATIVE    Comment: (NOTE) If result is NEGATIVE SARS-CoV-2 target nucleic acids are NOT DETECTED. The SARS-CoV-2 RNA is generally detectable in upper and lower  respiratory specimens during the acute phase of infection. The lowest  concentration of SARS-CoV-2 viral copies this assay can detect is 250  copies / mL. A negative result does not preclude SARS-CoV-2 infection  and should not be used as the sole basis for treatment or other  patient  management decisions.  A negative result may occur with  improper specimen collection / handling, submission of specimen other  than nasopharyngeal swab, presence of viral mutation(s) within the  areas targeted by this assay, and inadequate number of viral copies  (<250 copies / mL). A negative result must be combined with clinical  observations, patient history, and epidemiological information. If result is POSITIVE SARS-CoV-2 target nucleic acids are DETECTED. The SARS-CoV-2 RNA is generally detectable in upper and lower  respiratory specimens dur ing the acute phase of infection.  Positive  results are indicative of active infection with SARS-CoV-2.  Clinical  correlation with patient history and other diagnostic information is  necessary to determine patient infection status.  Positive results do  not rule out bacterial infection or co-infection with other viruses. If result is PRESUMPTIVE POSTIVE SARS-CoV-2 nucleic acids MAY BE PRESENT.   A presumptive positive result was obtained on the submitted specimen  and confirmed on repeat testing.  While 2019 novel coronavirus  (SARS-CoV-2) nucleic acids may be present in the submitted sample  additional confirmatory testing may be necessary for epidemiological  and / or clinical management purposes  to differentiate between  SARS-CoV-2 and other Sarbecovirus currently known to infect humans.  If clinically indicated additional testing with an alternate test  methodology [phone]) is advised. The SARS-CoV-2 RNA is generally  detectable in upper and lower respiratory sp ecimens during the acute  phase of infection. The expected result is Negative. Fact Sheet for Patients:  StrictlyIdeas.no Fact Sheet for Healthcare Providers: BankingDealers.co.za This test is not yet approved or cleared by the [place] FDA and has been authorized for detection and/or diagnosis of SARS-CoV-2 by FDA under  an Emergency Use Authorization (EUA).  This EUA will remain in effect (meaning this test can be used) for the duration of the COVID-19 declaration under Section 564(b)(1) of the Act, 21 U.S.C. section 360bbb-3(b)(1), unless the authorization is terminated or revoked sooner. Performed at [facility], [address] [place], [place] [address]   Lactic acid, plasma     Status: None   Collection Time: 06/01/19 12:42 PM  Result Value Ref Range   Lactic Acid, Venous 0.9 0.5 - 1.9 mmol/L    Comment: Performed at [facility], [place], [place] [address]  Troponin I (High Sensitivity)     Status: None   Collection Time: 06/01/19 12:42 PM  Result Value Ref Range   Troponin I (High Sensitivity) 8 <18 ng/L    Comment: (NOTE) Elevated high sensitivity troponin I (hsTnI) values and significant  changes across serial measurements may suggest ACS but many other  chronic and acute conditions are known to elevate hsTnI results.  Refer to the "Links" section for chest pain algorithms and additional  guidance. Performed at [facility], [address] [place], [place] [address]    Dg Chest Portable 1 View  Result Date: 06/01/2019 CLINICAL DATA:  Shortness of breath beginning yesterday. EXAM: PORTABLE CHEST 1 VIEW COMPARISON:  08/17/2016 FINDINGS: Cardiomegaly. Aortic atherosclerosis. Pulmonary venous hypertension with mild interstitial edema. Bilateral effusions with dependent atelectasis. IMPRESSION: Congestive heart failure. Cardiomegaly. Aortic atherosclerosis. Pulmonary venous hypertension and mild interstitial edema. Bilateral effusions and lower lung atelectasis. Electronically Signed   By: Nelson Chimes M.D.   On: 06/01/2019 11:02    Pending Labs Unresulted Labs (From admission, onward)   None      Vitals/Pain Today's Vitals   06/01/19 1130 06/01/19 1230 06/01/19 1330 06/01/19 1500  BP: (!) 159/83 (!) 169/92 (!) 167/87 (!) 163/71  Pulse: 81  (!) 164 86 96  Resp: (!) 23 (!) 25 (!) 30 (!) 31  Temp:      SpO2: (!) 89% 98% 100% 95%  Weight:      Height:      PainSc:        Isolation Precautions No active isolations  Medications Medications  methylPREDNISolone sodium succinate (SOLU-MEDROL) 125 mg/2 mL injection 125 mg (125 mg Intravenous Given 06/01/19 1050)  ipratropium-albuterol (DUONEB) 0.5-2.5 (3) MG/3ML nebulizer solution 3 mL (3 mLs Nebulization Given 06/01/19 1102)  furosemide (LASIX) injection 80 mg (80 mg Intravenous Given 06/01/19 1140)  LORazepam (ATIVAN) injection 1 mg (1 mg Intravenous Given 06/01/19 1150)    Mobility walks Low fall risk   Focused Assessments See focused assessments   R Recommendations: See Admitting Provider Note  Report given to:   Additional Notes:  Initially on Bipap, now on 5L via [place].

## 2019-06-02 ENCOUNTER — Inpatient Hospital Stay
Admit: 2019-06-02 | Discharge: 2019-06-02 | Disposition: A | Discharge status: Home / Self Care | Payer: Medicare Other | Attending: Internal Medicine | Admitting: Internal Medicine

## 2019-06-02 ENCOUNTER — Inpatient Hospital Stay: Payer: Medicare Other

## 2019-06-02 LAB — GLUCOSE, CAPILLARY
Glucose-Capillary: 131 mg/dL — ABNORMAL HIGH (ref 70–99)
Glucose-Capillary: 197 mg/dL — ABNORMAL HIGH (ref 70–99)
Glucose-Capillary: 132 mg/dL — ABNORMAL HIGH (ref 70–99)
Glucose-Capillary: 180 mg/dL — ABNORMAL HIGH (ref 70–99)

## 2019-06-02 LAB — BASIC METABOLIC PANEL
Anion gap: 10 (ref 5–15)
BUN: 19 mg/dL (ref 8–23)
CO2: 28 mmol/L (ref 22–32)
Calcium: 9.6 mg/dL (ref 8.9–10.3)
Chloride: 101 mmol/L (ref 98–111)
Creatinine, Ser: 0.81 mg/dL (ref 0.44–1.00)
GFR calc Af Amer: >60 mL/min (ref >60)
GFR calc non Af Amer: >60 mL/min (ref >60)
Glucose, Bld: 128 mg/dL — ABNORMAL HIGH (ref 70–99)
Potassium: 3.9 mmol/L (ref 3.5–5.1)
Sodium: 139 mmol/L (ref 135–145)

## 2019-06-02 LAB — CBC
HCT: 40.3 % (ref 36.0–46.0)
Hemoglobin: 13.2 g/dL (ref 12.0–15.0)
MCH: 33.2 pg (ref 26.0–34.0)
MCHC: 32.8 g/dL (ref 30.0–36.0)
MCV: 101.5 fL — ABNORMAL HIGH (ref 80.0–100.0)
Platelets: 323 10*3/uL (ref 150–400)
RBC: 3.97 MIL/uL (ref 3.87–5.11)
RDW: 13.9 % (ref 11.5–15.5)
WBC: 15.1 10*3/uL — ABNORMAL HIGH (ref 4.0–10.5)
nRBC: 0 % (ref 0.0–0.2)

## 2019-06-02 LAB — ECHOCARDIOGRAM COMPLETE
Height: 67 in
Weight: 3664 oz

## 2019-06-02 MED ORDER — METHYLPREDNISOLONE SODIUM SUCC 125 MG IJ SOLR
60.0000 mg | Freq: Every day | INTRAMUSCULAR | Status: DC
  Administered 2019-06-03: 60 mg via INTRAVENOUS
  Filled 2019-06-02: qty 2

## 2019-06-02 NOTE — Progress Notes (Signed)
SATURATION QUALIFICATIONS: (This note is used to comply with regulatory documentation for home oxygen)  Patient Saturations on Room Air at Rest = 83%  Patient Saturations on Room Air while Ambulating = n/a%  Patient Saturations on 2.5 Liters of oxygen while restting = 93%  Please briefly explain why patient needs home oxygen:

## 2019-06-02 NOTE — Evaluation (Signed)
Physical Therapy Evaluation Patient Details Name: Angelica Juarez MRN: [id number] DOB: 1941-08-13 Today's Date: 06/02/2019   History of Present Illness  78 y.o. female with a known history of hypertension, hyperlipidemia, COPD, type 2 diabetes, CKD, history of seizures, OSA on CPAP who presented to the ED with shortness of breath over the last couple days.Chest x-ray revealed mild interstitial edema and the patient was treated with intravenous furosemide and Solu-Medrol with clinical improvement.  ECG revealed atrial fibrillation with controlled rate of 80 bpm    Clinical Impression  Patient alert, oriented, very pleasant and verbose during session, shared with PT about her spouse and previous experiences. Provided clear PLOF, previously independent, assists with significant others medications, and they eat out for all meals.  Patient able to sit up in bed mod I and converse with PT, spO2 monitored throughout >92% on 5L. Sit <> stand with CGA/supervision throughout session, and utilized standard commode mod I. Pt ambulated ~257ft with CGA/supervision, mild unsteadiness noted, weaving gait path, moderately improved with UE support (unilateral). Complained of fatigue and unsteadiness though no overt LOB noted. spO2 monitored continuously, 89% at end of ambulation on 4L, returned to sitting and placed on 5L recovered to >92% quickly. Overall the patient demonstrated near return to baseline, but did to unsteadiness, continued education on energy conservation and increased oxygen needs compared to baseline PT recommending HHPT.     Follow Up Recommendations Home health PT    Equipment Recommendations  None recommended by PT(Pt has RW and SPC at home)    Recommendations for Other Services       Precautions / Restrictions Precautions Precautions: Fall Precaution Comments: monitor O2 Restrictions Weight Bearing Restrictions: No      Mobility  Bed Mobility Overal bed mobility: Needs  Assistance Bed Mobility: Supine to Sit     Supine to sit: HOB elevated;Modified independent (Device/Increase time)        Transfers Overall transfer level: Needs assistance Equipment used: None Transfers: Sit to/from Stand Sit to Stand: Supervision            Ambulation/Gait Ambulation/Gait assistance: Supervision;Min guard Gait Distance (Feet): 200 Feet Assistive device: None(UE support on handrails in hallways)       General Gait Details: mild unsteadiness noted, weaving gait path, moderately improved with UE support (unilateral). Complained of fatigue and unsteadiness though no overt LOB noted.  Stairs            Wheelchair Mobility    Modified Rankin (Stroke Patients Only)       Balance Overall balance assessment: Needs assistance Sitting-balance support: Feet supported Sitting balance-Leahy Scale: Good     Standing balance support: Single extremity supported Standing balance-Leahy Scale: Fair                               Pertinent Vitals/Pain Pain Assessment: No/denies pain    Home Living Family/patient expects to be discharged to:: Private residence Living Arrangements: Spouse/significant other Available Help at Discharge: Family Type of Home: House Home Access: Stairs to enter Entrance Stairs-Rails: Right Entrance Stairs-Number of Steps: 3 Home Layout: One level Home Equipment: [place] - 4 wheels;Walker - 2 wheels;Shower seat - built in      Prior Function Level of Independence: Independent         Comments: Pt has a housekeeper come twice a month to assist with cleaning. Pt and significant other eat out for all meals.  Hand Dominance        Extremity/Trunk Assessment   Upper Extremity Assessment Upper Extremity Assessment: Overall WFL for tasks assessed    Lower Extremity Assessment Lower Extremity Assessment: Generalized weakness    Cervical / Trunk Assessment Cervical / Trunk Assessment: Normal   Communication      Cognition Arousal/Alertness: Awake/alert Behavior During Therapy: WFL for tasks assessed/performed Overall Cognitive Status: Within Functional Limits for tasks assessed                                        General Comments      Exercises Other Exercises Other Exercises: Pt educated on PLB and energy conservation techniques   Assessment/Plan    PT Assessment Patient needs continued PT services  PT Problem List Decreased strength;Decreased mobility;Decreased activity tolerance;Decreased balance;Cardiopulmonary status limiting activity       PT Treatment Interventions DME instruction;Therapeutic exercise;Gait training;Balance training;Stair training;Neuromuscular re-education;Patient/family education;Therapeutic activities;Functional mobility training    PT Goals (Current goals can be found in the Care Plan section)  Acute Rehab PT Goals Patient Stated Goal: to go home PT Goal Formulation: With patient Time For Goal Achievement: 06/16/19 Potential to Achieve Goals: Fair    Frequency Min 2X/week   Barriers to discharge        Co-evaluation               AM-PAC PT "6 Clicks" Mobility  Outcome Measure Help needed turning from your back to your side while in a flat bed without using bedrails?: A Little Help needed moving from lying on your back to sitting on the side of a flat bed without using bedrails?: A Little Help needed moving to and from a bed to a chair (including a wheelchair)?: A Little Help needed standing up from a chair using your arms (e.g., wheelchair or bedside chair)?: A Little Help needed to walk in hospital room?: A Little Help needed climbing 3-5 steps with a railing? : A Little 6 Click Score: 18    End of Session Equipment Utilized During Treatment: Gait belt Activity Tolerance: Patient tolerated treatment well Patient left: in chair;with call bell/phone within reach(Patient up in chair without chair alarm  turned on with RN consent) Nurse Communication: Mobility status PT Visit Diagnosis: Other abnormalities of gait and mobility (R26.89);Muscle weakness (generalized) (M62.81);Other symptoms and signs involving the nervous system (R29.898);Difficulty in walking, not elsewhere classified (R26.2)    Time: 1137-1208 PT Time Calculation (min) (ACUTE ONLY): 31 min   Charges:   PT Evaluation $PT Eval Moderate Complexity: 1 Mod PT Treatments $Therapeutic Exercise: 8-22 mins       Lieutenant Diego PT, DPT 12:52 PM,06/02/19 [phone]

## 2019-06-02 NOTE — Consult Note (Signed)
[facility] Cardiology  CARDIOLOGY CONSULT NOTE  Patient ID: Angelica Juarez MRN: [id number] DOB/AGE: 1941-06-22 78 y.o.  Admit date: 06/01/2019 Referring Physician Anselm Jungling Primary Physician [place] Primary Cardiologist Fath Reason for Consultation congestive heart failure  HPI: 78 year old female referred for evaluation of congestive heart failure and atrial fibrillation.  Patient presents with several day history of worsening shortness of breath and productive cough.  She was noted to be hypoxic in the emergency room usually treated with BiPAP and transition to 5 L nasal cannula.  Chest x-ray revealed mild interstitial edema and the patient was treated with intravenous furosemide and Solu-Medrol with clinical improvement.  ECG revealed atrial fibrillation with controlled rate of 80 bpm.  The patient was started on Eliquis for stroke prevention.  Labs notable for normal troponin of 9 and 8.  BNP was 359.  Review of systems complete and found to be negative unless listed above     Past Medical History:  Diagnosis Date  . Allergy    Seasonal  . Arthritis   . 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 Chronic kidney disease    chronic renal insufficiency  . Colon polyps 05/21/14  . COPD (chronic obstructive pulmonary disease) ([place])   . Diabetes mellitus without complication ([place])   . Dyspnea   . Fracture closed, humerus, shaft    right   . History of kidney stones    40 years ago  . Hyperlipidemia 04/30/14  . Hypertension   . Impingement syndrome of right shoulder 12/29/15  . Personal history of radiation therapy   . Pneumonia    hx  . Rotator cuff tear 04/21/16   right  . Seizures ([place])   . Sleep apnea     Past Surgical History:  Procedure Laterality Date  . ABDOMINAL HYSTERECTOMY    . 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 EYE SURGERY     bilateral cataracts  . JOINT REPLACEMENT     bilateral knee replacement  . NASAL SEPTUM SURGERY    . OOPHORECTOMY    .  REPLACEMENT TOTAL KNEE Bilateral 2004  . SHOULDER ARTHROSCOPY WITH BICEPSTENOTOMY Right 05/25/2016   Procedure: SHOULDER ARTHROSCOPY WITH BICEPSTENOTOMY;  Surgeon: Leanor Kail, MD;  Location: [facility] ORS;  Service: Orthopedics;  Laterality: Right;  . SHOULDER ARTHROSCOPY WITH DISTAL CLAVICLE RESECTION Right 05/25/2016   Procedure: SHOULDER ARTHROSCOPY WITH DISTAL CLAVICLE RESECTION;  Surgeon: Leanor Kail, MD;  Location: [facility] ORS;  Service: Orthopedics;  Laterality: Right;  . SHOULDER ARTHROSCOPY WITH OPEN ROTATOR CUFF REPAIR Right 05/25/2016   Procedure: SHOULDER ARTHROSCOPY WITH OPEN ROTATOR CUFF REPAIR;  Surgeon: Leanor Kail, MD;  Location: [facility] ORS;  Service: Orthopedics;  Laterality: Right;  . SUBACROMIAL DECOMPRESSION Right 05/25/2016   Procedure: SUBACROMIAL DECOMPRESSION;  Surgeon: Leanor Kail, MD;  Location: [facility] ORS;  Service: Orthopedics;  Laterality: Right;    Medications Prior to Admission  Medication Sig Dispense Refill Last Dose  . albuterol (ACCUNEB) 1.25 MG/3ML nebulizer solution Take 3 mLs (1.25 mg total) by nebulization every 6 (six) hours as needed. wheezing 75 mL 3 Unknown at PRN  . aspirin 81 MG tablet Take 81 mg by mouth daily.   05/31/2019 at 0800  . atorvastatin (LIPITOR) 40 MG tablet Take 40 mg by mouth daily.   05/31/2019 at Unknown time  . B Complex Vitamins (VITAMIN B COMPLEX PO) Take 1 tablet by mouth daily.   05/31/2019 at Unknown time  . Calcium Carbonate-Vitamin D 600-200 MG-UNIT CAPS  Take 1 capsule by mouth daily.   05/31/2019 at Unknown time  . cloNIDine (CATAPRES) 0.2 MG tablet Take 0.2 mg by mouth 2 (two) times daily.   05/31/2019 at 0800  . diphenhydrAMINE (BENADRYL) 25 mg capsule Take 25 mg by mouth daily.   Past Week at Unknown time  . EPINEPHrine 0.3 mg/0.3 mL IJ SOAJ injection Inject 0.3 mg into the muscle as needed for anaphylaxis.    As directed at As directed  . fluticasone (FLONASE) 50 MCG/ACT nasal spray Place 2 sprays into both nostrils daily. 16  g 2 05/31/2019 at 0800  . Fluticasone-Umeclidin-Vilant (TRELEGY ELLIPTA) 100-62.5-25 MCG/INH AEPB Inhale 1 puff into the lungs daily. 1 each 4 05/31/2019 at 0800  . furosemide (LASIX) 20 MG tablet Take 1 tablet (20 mg total) by mouth 2 (two) times daily. 60 tablet 0 05/31/2019 at 0800  . hydrALAZINE (APRESOLINE) 100 MG tablet Take 50 mg by mouth 3 (three) times daily.    05/31/2019 at 0800  . hydroxyurea (HYDREA) 500 MG capsule Take 1 capsule (500 mg total) by mouth daily. May take with food to minimize GI side effects. 90 capsule 0 05/31/2019 at 0800  . levETIRAcetam (KEPPRA) 750 MG tablet Take 750 mg by mouth 2 (two) times daily.   05/31/2019 at 0800  . metFORMIN (GLUCOPHAGE) 500 MG tablet Take 500 mg by mouth 2 (two) times daily with a meal.   05/31/2019 at 0800  . metoprolol succinate (TOPROL-XL) 100 MG 24 hr tablet Take 100 mg by mouth daily.   05/31/2019 at 0800  . montelukast (SINGULAIR) 10 MG tablet TAKE 1 TABLET BY MOUTH EVERY DAY (Patient taking differently: Take 10 mg by mouth daily. ) 90 tablet 0 05/31/2019 at Unknown time  . Multiple Vitamin (MULTI-VITAMINS) TABS Take 1 tablet by mouth daily.   05/31/2019 at Unknown time  . NIFEdipine (NIFEDICAL XL) 30 MG 24 hr tablet Take 30 mg by mouth daily.   05/31/2019 at Unknown time  . OVER THE COUNTER MEDICATION Place 1 drop into both eyes daily. Allergy eye drops   05/31/2019 at Unknown time  . sitaGLIPtin (JANUVIA) 25 MG tablet Take 1 tablet by mouth daily.   05/31/2019 at Unknown time  . traZODone (DESYREL) 50 MG tablet Take 50-100 mg by mouth at bedtime as needed for sleep.   0 Past Week at Unknown time  . valsartan-hydrochlorothiazide (DIOVAN-HCT) 320-25 MG tablet Take 1 tablet by mouth daily.  0 05/31/2019 at 0800  . VENTOLIN HFA 108 (90 Base) MCG/ACT inhaler INHALE 2 PUFFS INTO THE LUNGS EVERY 6 HOURS AS NEEDED (Patient taking differently: Inhale 2 puffs into the lungs every 6 (six) hours as needed for wheezing or shortness of breath. ) 54 g 1 06/01/2019  at PRN  . vitamin C (ASCORBIC ACID) 500 MG tablet Take 500 mg by mouth 2 (two) times daily.   05/31/2019 at 0800   Social History   Socioeconomic History  . Marital status: Single    Spouse name: Not on file  . Number of children: Not on file  . Years of education: Not on file  . Highest education level: Not on file  Occupational History  . Not on file  Social Needs  . Financial resource strain: Not on file  . Food insecurity    Worry: Not on file    Inability: Not on file  . Transportation needs    Medical: Not on file    Non-medical: Not on file  Tobacco Use  .  Smoking status: Former Smoker    Packs/day: 1.00    Years: 40.00    Pack years: 40.00    Quit date: 05/12/2005    Years since quitting: 14.0  . Smokeless tobacco: Never Used  Substance and Sexual Activity  . Alcohol use: No  . Drug use: No  . Sexual activity: Not on file  Lifestyle  . Physical activity    Days per week: Not on file    Minutes per session: Not on file  . Stress: Not on file  Relationships  . Social Herbalist on phone: Not on file    Gets together: Not on file    Attends religious service: Not on file    Active member of club or organization: Not on file    Attends meetings of clubs or organizations: Not on file    Relationship status: Not on file  . Intimate partner violence    Fear of current or ex partner: Not on file    Emotionally abused: Not on file    Physically abused: Not on file    Forced sexual activity: Not on file  Other Topics Concern  . Not on file  Social History Narrative  . Not on file    Family History  Problem Relation Age of Onset  . Breast cancer Paternal Aunt 75  . Diabetes Father   . Heart disease Father       Review of systems complete and found to be negative unless listed above      PHYSICAL EXAM  General: Well developed, well nourished, in no acute distress HEENT:  Normocephalic and atramatic Neck:  No JVD.  Lungs: Clear bilaterally  to auscultation and percussion. Heart: HRRR . Normal S1 and S2 without gallops or murmurs.  Abdomen: Bowel sounds are positive, abdomen soft and non-tender  Msk:  Back normal, normal gait. Normal strength and tone for age. Extremities: No clubbing, cyanosis or edema.   Neuro: Alert and oriented X 3. Psych:  Good affect, responds appropriately  Labs:   Lab Results  Component Value Date   WBC 15.1 (H) 06/02/2019   HGB 13.2 06/02/2019   HCT 40.3 06/02/2019   MCV 101.5 (H) 06/02/2019   PLT 323 06/02/2019    Recent Labs  Lab 06/01/19 1027 06/02/19 0508  NA 142 139  K 4.0 3.9  CL 104 101  CO2 27 28  BUN 15 19  CREATININE 0.90 0.81  CALCIUM 9.8 9.6  PROT 6.9  --   BILITOT 1.3*  --   ALKPHOS 76  --   ALT 32  --   AST 31  --   GLUCOSE 129* 128*   Lab Results  Component Value Date   CKTOTAL 25 09/17/2012   CKMB 1.1 09/17/2012   TROPONINI <0.03 08/06/2016   No results found for: CHOL No results found for: HDL No results found for: LDLCALC No results found for: TRIG No results found for: CHOLHDL No results found for: LDLDIRECT    Radiology: US Venous Img Lower Unilateral Right  Result Date: 06/02/2019 CLINICAL DATA:  Swelling EXAM: RIGHT LOWER EXTREMITY VENOUS DOPPLER ULTRASOUND TECHNIQUE: Gray-scale sonography with compression, as well as color and duplex ultrasound, were performed to evaluate the deep venous system from the level of the common femoral vein through the popliteal and proximal calf veins. COMPARISON:  04/30/2014 FINDINGS: Normal compressibility of the common femoral, superficial femoral, and popliteal veins, as well as the proximal calf veins. No filling defects  to suggest DVT on grayscale or color Doppler imaging. Doppler waveforms show normal direction of venous flow, normal respiratory phasicity and response to augmentation. Survey views of the contralateral common femoral vein are unremarkable. IMPRESSION: No femoropopliteal and no calf DVT in the visualized  calf veins. If clinical symptoms are inconsistent or if there are persistent or worsening symptoms, further imaging (possibly involving the iliac veins) may be warranted. Electronically Signed   By: Lucrezia Europe M.D.   On: 06/02/2019 08:12   Dg Chest Portable 1 View  Result Date: 06/01/2019 CLINICAL DATA:  Shortness of breath beginning yesterday. EXAM: PORTABLE CHEST 1 VIEW COMPARISON:  08/17/2016 FINDINGS: Cardiomegaly. Aortic atherosclerosis. Pulmonary venous hypertension with mild interstitial edema. Bilateral effusions with dependent atelectasis. IMPRESSION: Congestive heart failure. Cardiomegaly. Aortic atherosclerosis. Pulmonary venous hypertension and mild interstitial edema. Bilateral effusions and lower lung atelectasis. Electronically Signed   By: Nelson Chimes M.D.   On: 06/01/2019 11:02    EKG: Atrial fibrillation at 80 bpm  ASSESSMENT AND PLAN:   1.  Acute on chronic diastolic congestive heart failure, improved after initial diuresis 2.  New onset atrial fibrillation with controlled ventricular rate, chads vas score 5, started on Eliquis for stroke prevention 3.  COPD exacerbation  Recommendations  1.  Agree with current therapy 2.  Continue diuresis 3.  Carefully monitor renal status 4.  Continue Eliquis for stroke prevention 5.  Review 2D echocardiogram 6.  Further recommendations pending echocardiogram results    Signed: Isaias Cowman MD,PhD, [facility] 06/02/2019, 8:53 AM

## 2019-06-02 NOTE — Progress Notes (Signed)
[place] [facility] [place] NAME: Angelica Juarez    MR#:  [id number]  DATE OF BIRTH:  02-21-1941  SUBJECTIVE:  CHIEF COMPLAINT:   Chief Complaint  Patient presents with  . Shortness of Breath   Came with worsening shortness of breath and orthopnea for last few days 2 weeks.  Feels slightly better with Lasix and diuresis now.  No chest pain.  REVIEW OF SYSTEMS:  CONSTITUTIONAL: No fever, fatigue or weakness.  EYES: No blurred or double vision.  EARS, NOSE, AND THROAT: No tinnitus or ear pain.  RESPIRATORY: No cough, have shortness of breath, no wheezing or hemoptysis.  CARDIOVASCULAR: No chest pain, have orthopnea, edema.  GASTROINTESTINAL: No nausea, vomiting, diarrhea or abdominal pain.  GENITOURINARY: No dysuria, hematuria.  ENDOCRINE: No polyuria, nocturia,  HEMATOLOGY: No anemia, easy bruising or bleeding SKIN: No rash or lesion. MUSCULOSKELETAL: No joint pain or arthritis.   NEUROLOGIC: No tingling, numbness, weakness.  PSYCHIATRY: No anxiety or depression.   ROS  DRUG ALLERGIES:   Allergies  Allergen Reactions  . Iodinated Diagnostic Agents Anaphylaxis    Other reaction(s): Other (See Comments) Throat swells and extreme hives  . Latex Itching  . Phenobarbital Hives  . Tape Rash    silicones    VITALS:  Blood pressure (!) 163/76, pulse 76, temperature 97.7 F (36.5 C), temperature source Oral, resp. rate 18, height 5\' 7"  (1.702 m), weight 103.9 kg, SpO2 96 %.  PHYSICAL EXAMINATION:  GENERAL:  78 y.o.-year-old patient lying in the bed with no acute distress.  EYES: Pupils equal, round, reactive to light and accommodation. No scleral icterus. Extraocular muscles intact.  HEENT: Head atraumatic, normocephalic. Oropharynx and nasopharynx clear.  NECK:  Supple, no jugular venous distention. No thyroid enlargement, no tenderness.  LUNGS: Normal breath sounds bilaterally, no wheezing, some crepitation. No use of accessory muscles of  respiration.  CARDIOVASCULAR: S1, S2 normal. No murmurs, rubs, or gallops.  ABDOMEN: Soft, nontender, nondistended. Bowel sounds present. No organomegaly or mass.  EXTREMITIES: Mild pedal edema, no cyanosis, or clubbing.  NEUROLOGIC: Cranial nerves II through XII are intact. Muscle strength 5/5 in all extremities. Sensation intact. Gait not checked.  PSYCHIATRIC: The patient is alert and oriented x 3.  SKIN: No obvious rash, lesion, or ulcer.   Physical Exam LABORATORY PANEL:   CBC Recent Labs  Lab 06/02/19 0508  WBC 15.1*  HGB 13.2  HCT 40.3  PLT 323   ------------------------------------------------------------------------------------------------------------------  Chemistries  Recent Labs  Lab 06/01/19 1027 06/02/19 0508  NA 142 139  K 4.0 3.9  CL 104 101  CO2 27 28  GLUCOSE 129* 128*  BUN 15 19  CREATININE 0.90 0.81  CALCIUM 9.8 9.6  AST 31  --   ALT 32  --   ALKPHOS 76  --   BILITOT 1.3*  --    ------------------------------------------------------------------------------------------------------------------  Cardiac Enzymes No results for input(s): TROPONINI in the last 168 hours. ------------------------------------------------------------------------------------------------------------------  RADIOLOGY:  US Venous Img Lower Unilateral Right  Result Date: 06/02/2019 CLINICAL DATA:  Swelling EXAM: RIGHT LOWER EXTREMITY VENOUS DOPPLER ULTRASOUND TECHNIQUE: Gray-scale sonography with compression, as well as color and duplex ultrasound, were performed to evaluate the deep venous system from the level of the common femoral vein through the popliteal and proximal calf veins. COMPARISON:  04/30/2014 FINDINGS: Normal compressibility of the common femoral, superficial femoral, and popliteal veins, as well as the proximal calf veins. No filling defects to suggest DVT on grayscale or color Doppler  imaging. Doppler waveforms show normal direction of venous flow, normal  respiratory phasicity and response to augmentation. Survey views of the contralateral common femoral vein are unremarkable. IMPRESSION: No femoropopliteal and no calf DVT in the visualized calf veins. If clinical symptoms are inconsistent or if there are persistent or worsening symptoms, further imaging (possibly involving the iliac veins) may be warranted. Electronically Signed   By: Lucrezia Europe M.D.   On: 06/02/2019 08:12   Dg Chest Portable 1 View  Result Date: 06/01/2019 CLINICAL DATA:  Shortness of breath beginning yesterday. EXAM: PORTABLE CHEST 1 VIEW COMPARISON:  08/17/2016 FINDINGS: Cardiomegaly. Aortic atherosclerosis. Pulmonary venous hypertension with mild interstitial edema. Bilateral effusions with dependent atelectasis. IMPRESSION: Congestive heart failure. Cardiomegaly. Aortic atherosclerosis. Pulmonary venous hypertension and mild interstitial edema. Bilateral effusions and lower lung atelectasis. Electronically Signed   By: Nelson Chimes M.D.   On: 06/01/2019 11:02    ASSESSMENT AND PLAN:   Active Problems:   Acute respiratory failure with hypoxia (HCC)  Acute hypoxic respiratory failure- secondary to acute on chronic diastolic congestive heart failure and COPD exacerbation.  Follows with Dr. Ubaldo Glassing as an outpatient. [facility] Cardiology consult appreciated. -Continue IV diuresis -ECHO reviewed, confirms normal EF but diastolic heart failure -Solu-Medrol IV -Continue home inhalers -DuoNebs PRN -Wean O2 as able -Would ambulate with pulse ox prior to discharge to decide on need of home oxygen.  New onset atrial fibrillation- noted on EKG on admission. HR is around 100. CHADSVASC is a 5. -Continue home metoprolol -Start Eliquis  [facility] Cardiology consult -Cardiac monitoring  Lower extremity edema- R > L.  Likely due to CHF exacerbation. -Negative right lower extremity Doppler ultrasound to rule out DVT  Hypertension-blood pressures mildly elevated in the ED -Continue home  clonidine, hydralazine, metoprolol, nifedipine, valsartan, HCTZ  Type 2 diabetes-stable -Sensitive SSI  Hyperlipidemia-stable -Continue home Lipitor  History of seizures-no recent seizure-like activity -Continue home Keppra  OSA- stable -CPAP nightly     All the records are reviewed and case discussed with Care Management/Social Workerr. Management plans discussed with the patient, family and they are in agreement.  CODE STATUS: full.  TOTAL TIME TAKING CARE OF THIS PATIENT: 35 minutes.     POSSIBLE D/C IN 1-2 DAYS, DEPENDING ON CLINICAL CONDITION.   Vaughan Basta M.D on 06/02/2019   Between 7am to 6pm - Pager - [phone]  After 6pm go to [email or web address] - password [email or web address] [place] [facility]  Office  [phone]  CC: Primary care physician; Sofie Hartigan, MD  Note: This dictation was prepared with Dragon dictation along with smaller phrase technology. Any transcriptional errors that result from this process are unintentional.

## 2019-06-03 ENCOUNTER — Inpatient Hospital Stay: Payer: Medicare Other

## 2019-06-03 LAB — CBC
HCT: 38.2 % (ref 36.0–46.0)
Hemoglobin: 12.7 g/dL (ref 12.0–15.0)
MCH: 33.3 pg (ref 26.0–34.0)
MCHC: 33.2 g/dL (ref 30.0–36.0)
MCV: 100.3 fL — ABNORMAL HIGH (ref 80.0–100.0)
Platelets: 295 10*3/uL (ref 150–400)
RBC: 3.81 MIL/uL — ABNORMAL LOW (ref 3.87–5.11)
RDW: 13.7 % (ref 11.5–15.5)
WBC: 13.2 10*3/uL — ABNORMAL HIGH (ref 4.0–10.5)
nRBC: 0 % (ref 0.0–0.2)

## 2019-06-03 LAB — BASIC METABOLIC PANEL
Anion gap: 9 (ref 5–15)
BUN: 30 mg/dL — ABNORMAL HIGH (ref 8–23)
CO2: 27 mmol/L (ref 22–32)
Calcium: 8.9 mg/dL (ref 8.9–10.3)
Chloride: 98 mmol/L (ref 98–111)
Creatinine, Ser: 0.93 mg/dL (ref 0.44–1.00)
GFR calc Af Amer: >60 mL/min (ref >60)
GFR calc non Af Amer: 60 mL/min — ABNORMAL LOW (ref >60)
Glucose, Bld: 130 mg/dL — ABNORMAL HIGH (ref 70–99)
Potassium: 3.5 mmol/L (ref 3.5–5.1)
Sodium: 134 mmol/L — ABNORMAL LOW (ref 135–145)

## 2019-06-03 LAB — GLUCOSE, CAPILLARY
Glucose-Capillary: 122 mg/dL — ABNORMAL HIGH (ref 70–99)
Glucose-Capillary: 177 mg/dL — ABNORMAL HIGH (ref 70–99)

## 2019-06-03 MED ORDER — APIXABAN 5 MG PO TABS: 5.0000 mg | ORAL_TABLET | Freq: Two times a day (BID) | ORAL | 0 refills | Status: DC

## 2019-06-03 NOTE — Progress Notes (Signed)
Cardiovascular and Pulmonary Nurse Navigator Note:    78 year old female with hx of HTN, HLD, COPD, DM2, CKD, seizures, OSA on CPAP nightly who presented to the ED with SOB and 1 - 2+ pitting edema in the lower extremities. Right > left.  Lower extremity doppler ultrasound negative for DVT.   Patient admitted with diagnosis on acute on chronic respiratory failure with hypoxia and acute on chronic congestive heart failure.  Echo performed this admission which revealed normal EF of 60-65%.  Patient is now requiring home oxygen.  CM RN setting up home oxygen and home health RN, PT, OT.    Patient with new onset of a-fib this admission.  Patient is being discharged home on eliquis and remains on metoprolol.   CHF Education:??  Educational session with patient completed. Patient sitting up in recliner finishing her lunch.  Patient alert and oriented.   Provided patient with "Living Better with Heart Failure" packet. Briefly reviewed definition of heart failure and signs and symptoms of an exacerbation. Potential causes of HF discussed. Explained to patient that HF is a chronic illness which requires self-assessment / self-management along with help from the cardiologist/PCP.  *Reviewed importance of and reason behind checking weight daily in the AM, after using the bathroom, but before getting dressed.  NOTE:  Patient has NOT been weighing herself.  Patient has functioning scale and plans to start weighing herself daily.   *Reviewed with patient the following information:  *Discussed when to call the Dr= weight gain of >2-3lb overnight of 5lb in a week,  *Discussed yellow zone= call MD: weight gain of >2-3lb overnight of 5lb in a week, increased swelling, increased SOB when lying down, chest discomfort, dizziness, increased fatigue  *Red Zone= call 911: struggle to breath, fainting or near fainting, significant chest pain  *Heart Failure Zone magnet given and reviewed with patient.  *Diet -  Reviewed low sodium diet-provided handout of recommended and not recommended foods. Dietitian Consultation for diet education has been ordered and is still pending. Handouts on Sodium Content of Foods given and reviewed with patient.  Patient stated she likes salt but does not add salt to her food. Patient uses pepper to season her food.   Reviewed with patient the importance of reading food labels and adjusting the sodium content of her meals so as not to consume more than 2000 mg of sodium per day.  Patient described eating frozen rice bowls with vegetables.  This RN encouraged patient to again at the food label for this rice bowl to see how much sodium is in a serving.  Patient also described eating take out for their meals.  We talked about ordering grilled chicken with no added salt and ways to make better choices.   *Discussed fluid intake with patient as well. Patient not currently on a prescribed fluid restriction this admission, but Dr. Candiss Norse has instructed patient to restrict her fluids to 48 ounces per day.    *Instructed patient to take medications as prescribed for heart failure. Explained briefly why pt is on the cardiac medications (either lower blood pressure and /or heart rate; make you feel better, live longer or keep you out of the hospital) and discussed monitoring and side effects.  Cardiac medications written in patient's Living Better with Heart Failure booklet on medication pages.    *Discussed exercise. Patient does not currently exercise.  Physical Therapy Evaluation suggesting HH OT and PT.  Explained to patient after she finishes [facility] PT/OT the recommendation  for her would be outpatient Pulmonary Rehab.  Informed patient she is a candidate for this program due to OSA, COPD, and CHF.  Overview of program provided.  Instructed patient to speak with her PCP or Pulmonologist about ordering this for her.  Pulmonary Rehab brochure provided.   ? *Smoking Cessation- Patient is a FORMER  smoker.    *[facility] Failure [facility] - Explained the purpose of the HF Clinic. ?Explained to patient the HF Clinic does not replace PCP nor Cardiologist, but is an additional resource to helping patient manage heart failure at home. Patient does not want to be followed in the [facility].  Note placed in [facility] - Physician Sticky Note - informing treatment team of this.   Again, the 5 Steps to Living Better with Heart Failure were reviewed with patient.  Patient was engaged in this educational session and asked pertinent questions.  Patient thanked me for providing the above information. ?  Roanna Epley, RN, BSN, [place]  [facility] Cardiac & Pulmonary Rehab  Cardiovascular & Pulmonary Nurse Navigator  Direct Line: [phone]  Department Phone #: [phone] Fax: [phone]  Email Address: [email or web address]

## 2019-06-03 NOTE — Evaluation (Signed)
Occupational Therapy Evaluation Patient Details Name: Angelica Juarez MRN: [id number] DOB: [birth year] Today's Date: 06/03/2019    History of Present Illness 78 y.o. female with a known history of hypertension, hyperlipidemia, COPD, type 2 diabetes, CKD, history of seizures, OSA on CPAP who presented to the ED with shortness of breath over the last couple days.Chest x-ray revealed mild interstitial edema and the patient was treated with intravenous furosemide and Solu-Medrol with clinical improvement.  ECG revealed atrial fibrillation with controlled rate of 80 bpm   Clinical Impression   Ms. Giannattasio was seen for OT evaluation this date. Pt was independent in all ADLs and mobility, living in a 1 story home with her significant other. Pt not on O2 at home prior to this admission. Pt reports becoming easily fatigued or out of breath with minimal exertion, requiring assist for bathing and toileting which prompted her to come to the hospital. Pt currently requires min assist for LB ADL tasks and supervision for upper body ADLs and functional mobility with regular cues for PLB due to poor activity tolerance. Pt educated in energy conservation conservation strategies including pursed lip breathing, activity pacing, home/routines modifications, work simplification, AE/DME, prioritizing of meaningful occupations, and falls prevention. Handout provided. Pt verbalized understanding but would benefit from additional skilled OT services to maximize recall and carryover of learned techniques and facilitate implementation of learned techniques into daily routines. Upon discharge, recommend [facility] services.       Follow Up Recommendations  Home health OT    Equipment Recommendations  None recommended by OT    Recommendations for Other Services       Precautions / Restrictions Precautions Precautions: Fall Precaution Comments: monitor O2 Restrictions Weight Bearing Restrictions: No      Mobility Bed  Mobility Overal bed mobility: Needs Assistance Bed Mobility: Supine to Sit     Supine to sit: HOB elevated;Modified independent (Device/Increase time)        Transfers Overall transfer level: Needs assistance Equipment used: None Transfers: Sit to/from Stand Sit to Stand: Supervision         General transfer comment: Requires asssit for mgt of O2 during transfers. Ambulated in room w/ supervision for safety. At times reaching for bed rail to stead self, overall good safety awareness.    Balance Overall balance assessment: Needs assistance Sitting-balance support: Feet supported Sitting balance-Leahy Scale: Good Sitting balance - Comments: Steady reaching within BOS.   Standing balance support: Single extremity supported;During functional activity Standing balance-Leahy Scale: Fair                             ADL either performed or assessed with clinical judgement   ADL Overall ADL's : Needs assistance/impaired Eating/Feeding: Sitting;Independent   Grooming: Sitting;Independent   Upper Body Bathing: Sitting;Minimal assistance;Min guard   Lower Body Bathing: Sitting/lateral leans;Min guard;Minimal assistance   Upper Body Dressing : Sitting;Independent   Lower Body Dressing: Sit to/from stand;Min guard   Toilet Transfer: RW;Min Radio broadcast assistant Details (indicate cue type and reason): Assist for mgt O2 Toileting- Clothing Manipulation and Hygiene: Min guard;Set up;Sit to/from stand   Tub/ Shower Transfer: Supervision/safety;Min Chief Executive Officer Details (indicate cue type and reason): Assist for mgt O2 Functional mobility during ADLs: Min guard;Supervision/safety General ADL Comments: Assist for mgt O2 during functional mobility.     Vision Baseline Vision/History: Wears glasses;Macular Degeneration Wears Glasses: At all times Patient Visual Report: No change from baseline  Perception     Praxis       Pertinent Vitals/Pain Pain Assessment: No/denies pain     Hand Dominance     Extremity/Trunk Assessment Upper Extremity Assessment Upper Extremity Assessment: Generalized weakness   Lower Extremity Assessment Lower Extremity Assessment: Generalized weakness;Defer to PT evaluation   Cervical / Trunk Assessment Cervical / Trunk Assessment: Normal   Communication Communication Communication: No difficulties   Cognition Arousal/Alertness: Awake/alert Behavior During Therapy: WFL for tasks assessed/performed Overall Cognitive Status: Within Functional Limits for tasks assessed                                 General Comments: Pleasant, cooperative, following VCs consistently.   General Comments  SpO2 >90 while in bed at rest on 2.5L After AMB dropped to 85, but rebounded to >93 with rest break and cues for PLB.    Exercises Other Exercises Other Exercises: Pt educated on ECS including PLB, safe use of AE for improved functional independence and to minimize fatigue with activity, falls prevention strategies and routines modifications to support endurance and activity tolerance upon DC.   Shoulder Instructions      Home Living Family/patient expects to be discharged to:: Private residence Living Arrangements: Spouse/significant other Available Help at Discharge: Family;Available 24 hours/day Type of Home: House Home Access: Stairs to enter [employer] of Steps: 3 Entrance Stairs-Rails: Right Home Layout: One level     Bathroom Shower/Tub: Occupational psychologist: Handicapped height     Home Equipment: Environmental consultant - 4 wheels;Walker - 2 wheels;Shower seat - built in;Grab bars - tub/shower;Adaptive equipment Adaptive Equipment: Reacher Additional Comments: Pt has suction grab-bars, doesn't feel very confident using them.      Prior Functioning/Environment Level of Independence: Independent        Comments: Pt has a housekeeper come  twice a month to assist with cleaning. Pt and significant other eat out for all meals.        OT Problem List: Decreased strength;Cardiopulmonary status limiting activity;Decreased activity tolerance;Decreased knowledge of use of DME or AE;Impaired balance (sitting and/or standing)      OT Treatment/Interventions: Self-care/ADL training;Therapeutic exercise;Therapeutic activities;Energy conservation;DME and/or AE instruction;Patient/family education    OT Goals(Current goals can be found in the care plan section) Acute Rehab OT Goals Patient Stated Goal: to go home OT Goal Formulation: With patient Time For Goal Achievement: 06/17/19 ADL Goals Pt Will Perform Upper Body Dressing: with modified independence;sitting Pt Will Perform Lower Body Dressing: with modified independence;sit to/from stand(With LRAD for improved safety and functional independence) Pt Will Transfer to Toilet: ambulating;with modified independence(With LRAD for improved safety and functional independence) Additional ADL Goal #1: Pt will independently verbalize a plan to implement at least 3 learned energy conservation strategies into her daily routines/occupations for improved safety and functional independence upon hospital DC.  OT Frequency: Min 1X/week   Barriers to D/C: Decreased caregiver support          Co-evaluation              AM-PAC OT "6 Clicks" Daily Activity     Outcome Measure Help from another person eating meals?: None Help from another person taking care of personal grooming?: None Help from another person toileting, which includes using toliet, bedpan, or urinal?: A Little Help from another person bathing (including washing, rinsing, drying)?: A Little Help from another person to put on and taking off regular upper body  clothing?: None Help from another person to put on and taking off regular lower body clothing?: A Little 6 Click Score: 21   End of Session Equipment Utilized During  Treatment: Gait belt Nurse Communication: Mobility status  Activity Tolerance: Patient tolerated treatment well Patient left: in chair;with call bell/phone within reach;with chair alarm set  OT Visit Diagnosis: Other abnormalities of gait and mobility (R26.89);Muscle weakness (generalized) (M62.81)                Time: 4481-8563 OT Time Calculation (min): 33 min Charges:  OT General Charges $OT Visit: 1 Visit OT Evaluation $OT Eval Low Complexity: 1 Low OT Treatments $Self Care/Home Management : 8-22 mins  Shara Blazing, M.S., OTR/L Ascom: [phone] 06/03/19, 10:03 AM

## 2019-06-03 NOTE — Progress Notes (Signed)
IV and tele removed from patient. Discharge instructions given to patient. Verbalized understanding. No acute distress at this time. Husband to transport patient home.

## 2019-06-03 NOTE — Progress Notes (Signed)
[facility]  SUBJECTIVE: Patient sitting on side of bed, reports feeling much better   Vitals:   06/02/19 1949 06/02/19 2049 06/03/19 0354 06/03/19 0801  BP:  138/60 126/88 132/65  Pulse:  69 (!) 107 64  Resp:    18  Temp:  97.9 F (36.6 C) (!) 97.5 F (36.4 C) 98.8 F (37.1 C)  TempSrc:  Oral Oral Oral  SpO2: (!) 83% 97% 95% 96%  Weight:   103.6 kg   Height:         Intake/Output Summary (Last 24 hours) at 06/03/2019 0841 Last data filed at 06/03/2019 0355 Gross per 24 hour  Intake 1080 ml  Output 2100 ml  Net -1020 ml      PHYSICAL EXAM  General: Well developed, well nourished, in no acute distress HEENT:  Normocephalic and atramatic Neck:  No JVD.  Lungs: Clear bilaterally to auscultation and percussion. Heart: HRRR . Normal S1 and S2 without gallops or murmurs.  Abdomen: Bowel sounds are positive, abdomen soft and non-tender  Msk:  Back normal, normal gait. Normal strength and tone for age. Extremities: No clubbing, cyanosis or edema.   Neuro: Alert and oriented X 3. Psych:  Good affect, responds appropriately   LABS: Basic Metabolic Panel: Recent Labs    06/02/19 0508 06/03/19 0455  NA 139 134*  K 3.9 3.5  CL 101 98  CO2 28 27  GLUCOSE 128* 130*  BUN 19 30*  CREATININE 0.81 0.93  CALCIUM 9.6 8.9   Liver Function Tests: Recent Labs    06/01/19 1027  AST 31  ALT 32  ALKPHOS 76  BILITOT 1.3*  PROT 6.9  ALBUMIN 4.4   No results for input(s): LIPASE, AMYLASE in the last 72 hours. CBC: Recent Labs    06/01/19 1027 06/02/19 0508 06/03/19 0455  WBC 10.6* 15.1* 13.2*  NEUTROABS 8.9*  --   --   HGB 14.1 13.2 12.7  HCT 42.9 40.3 38.2  MCV 102.4* 101.5* 100.3*  PLT 326 323 295   Cardiac Enzymes: No results for input(s): CKTOTAL, CKMB, CKMBINDEX, TROPONINI in the last 72 hours. BNP: Invalid input(s): POCBNP D-Dimer: No results for input(s): DDIMER in the last 72 hours. Hemoglobin A1C: No results for input(s): HGBA1C in the last 72  hours. Fasting Lipid Panel: No results for input(s): CHOL, HDL, LDLCALC, TRIG, CHOLHDL, LDLDIRECT in the last 72 hours. Thyroid Function Tests: No results for input(s): TSH, T4TOTAL, T3FREE, THYROIDAB in the last 72 hours.  Invalid input(s): FREET3 Anemia Panel: No results for input(s): VITAMINB12, FOLATE, FERRITIN, TIBC, IRON, RETICCTPCT in the last 72 hours.  Dg Chest 2 View  Result Date: 06/03/2019 CLINICAL DATA:  Congestive heart failure. EXAM: CHEST - 2 VIEW COMPARISON:  06/01/2019 FINDINGS: Stable top-normal heart size. Diminishment in pulmonary edema since the prior study with venous hypertensive changes remaining. Small bilateral pleural effusions remain, left greater than right. No focal airspace disease. IMPRESSION: Diminishment in pulmonary edema since the prior study. Small bilateral pleural effusions remain, left greater than right. Electronically Signed   By: Aletta Edouard M.D.   On: 06/03/2019 07:55   US Venous Img Lower Unilateral Right  Result Date: 06/02/2019 CLINICAL DATA:  Swelling EXAM: RIGHT LOWER EXTREMITY VENOUS DOPPLER ULTRASOUND TECHNIQUE: Gray-scale sonography with compression, as well as color and duplex ultrasound, were performed to evaluate the deep venous system from the level of the common femoral vein through the popliteal and proximal calf veins. COMPARISON:  04/30/2014 FINDINGS: Normal compressibility of the common femoral,  superficial femoral, and popliteal veins, as well as the proximal calf veins. No filling defects to suggest DVT on grayscale or color Doppler imaging. Doppler waveforms show normal direction of venous flow, normal respiratory phasicity and response to augmentation. Survey views of the contralateral common femoral vein are unremarkable. IMPRESSION: No femoropopliteal and no calf DVT in the visualized calf veins. If clinical symptoms are inconsistent or if there are persistent or worsening symptoms, further imaging (possibly involving the iliac  veins) may be warranted. Electronically Signed   By: Lucrezia Europe M.D.   On: 06/02/2019 08:12   Dg Chest Portable 1 View  Result Date: 06/01/2019 CLINICAL DATA:  Shortness of breath beginning yesterday. EXAM: PORTABLE CHEST 1 VIEW COMPARISON:  08/17/2016 FINDINGS: Cardiomegaly. Aortic atherosclerosis. Pulmonary venous hypertension with mild interstitial edema. Bilateral effusions with dependent atelectasis. IMPRESSION: Congestive heart failure. Cardiomegaly. Aortic atherosclerosis. Pulmonary venous hypertension and mild interstitial edema. Bilateral effusions and lower lung atelectasis. Electronically Signed   By: Nelson Chimes M.D.   On: 06/01/2019 11:02     Echo LVEF 60 to 65%  TELEMETRY: Atrial fibrillation 80 bpm:  ASSESSMENT AND PLAN:  Active Problems:   Acute respiratory failure with hypoxia (HCC)    1.  Acute on chronic diastolic congestive heart failure, improved 2.  New onset atrial fibrillation with controlled ventricular rate, chads Vascor 5, on Eliquis for stroke prevention 3.  COPD exacerbation  Recommendations  1.  Agree with current therapy 2.  Continue diuresis 3.  Carefully monitor renal status 4.  Continue Eliquis for stroke prevention 5.  Ambulate, patient does well, consider discharge later today 6.  Follow-up with Dr. Ubaldo Glassing 1 week  Sign off for now, please call with any questions   Isaias Cowman, MD, PhD, [facility] 06/03/2019 8:41 AM

## 2019-06-03 NOTE — Discharge Summary (Addendum)
[facility] NAME: Angelica Juarez    MR#:  [id number]  DATE OF BIRTH:  02/08/1941  DATE OF ADMISSION:  06/01/2019 ADMITTING PHYSICIAN: Sela Hua, MD  DATE OF DISCHARGE: 06/03/2019   PRIMARY CARE PHYSICIAN: Sofie Hartigan, MD    ADMISSION DIAGNOSIS:  Acute on chronic respiratory failure with hypoxia (HCC) [J96.21] Acute on chronic congestive heart failure, unspecified heart failure type ([place]) [I50.9]  DISCHARGE DIAGNOSIS:  Active Problems:   Acute on chronic respiratory failure with hypoxia (HCC)   Severe COPD  SECONDARY DIAGNOSIS:   Past Medical History:  Diagnosis Date  . Allergy    Seasonal  . Arthritis   . 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 Chronic kidney disease    chronic renal insufficiency  . Colon polyps 05/21/14  . COPD (chronic obstructive pulmonary disease) ([place])   . Diabetes mellitus without complication ([place])   . Dyspnea   . Fracture closed, humerus, shaft    right   . History of kidney stones    40 years ago  . Hyperlipidemia 04/30/14  . Hypertension   . Impingement syndrome of right shoulder 12/29/15  . Personal history of radiation therapy   . Pneumonia    hx  . Rotator cuff tear 04/21/16   right  . Seizures ([place])   . Sleep apnea     HOSPITAL COURSE:    Acute respiratory failure with hypoxia (HCC)  Acute on chronic respi failure- hypoxic - severe COPD secondary to acute on chronic diastolic congestive heart failure and COPD exacerbation. Follows with Dr. Ubaldo Glassing as an outpatient. [facility] Cardiology consult appreciated. -Continue IV diuresis -ECHO reviewed, confirms normal EF but diastolic heart failure -Solu-MedrolIV -Continue home inhalers -DuoNebs PRN -Wean O2 as able -Would ambulate with pulse ox prior to discharge to decide on need of home oxygen.  she did qualify for home oxygen. - felt better after diuresis- will discharge with advise to follow with Dr.  Ubaldo Glassing  Severe COPD- Oxygen at home arranged,  Treatment as above  New onset atrial fibrillation-noted on EKG on admission.HR is around 100.CHADSVASC is a 5. -Continue home metoprolol -Start Eliquis  [facility] Cardiology consult appreciated. -Cardiac monitoring  Lower extremityedema- R >L. Likely due to CHF exacerbation. -Negative right lower extremity Doppler ultrasound to rule out DVT  Hypertension-blood pressures mildly elevated in the ED -Continue home clonidine, hydralazine, metoprolol,nifedipine, valsartan, HCTZ  Type 2 diabetes-stable -SensitiveSSI  Hyperlipidemia-stable -Continue home Lipitor  History of seizures-no recent seizure-like activity -Continue home Keppra  OSA-stable -CPAP nightly  DISCHARGE CONDITIONS:   Stable.  CONSULTS OBTAINED:  Treatment Team:  Isaias Cowman, MD  DRUG ALLERGIES:   Allergies  Allergen Reactions  . Iodinated Diagnostic Agents Anaphylaxis    Other reaction(s): Other (See Comments) Throat swells and extreme hives  . Latex Itching  . Phenobarbital Hives  . Tape Rash    silicones    DISCHARGE MEDICATIONS:   Allergies as of 06/03/2019      Reactions   Iodinated Diagnostic Agents Anaphylaxis   Other reaction(s): Other (See Comments) Throat swells and extreme hives   Latex Itching   Phenobarbital Hives   Tape Rash   silicones      Medication List    TAKE these medications   apixaban 5 MG Tabs tablet Commonly known as: ELIQUIS Take 1 tablet (5 mg total) by mouth 2 (two) times daily.   aspirin 81 MG tablet Take 81 mg  by mouth daily.   atorvastatin 40 MG tablet Commonly known as: LIPITOR Take 40 mg by mouth daily.   Calcium Carbonate-Vitamin D 600-200 MG-UNIT Caps Take 1 capsule by mouth daily.   cloNIDine 0.2 MG tablet Commonly known as: CATAPRES Take 0.2 mg by mouth 2 (two) times daily.   diphenhydrAMINE 25 mg capsule Commonly known as: BENADRYL Take 25 mg by mouth daily.    EPINEPHrine 0.3 mg/0.3 mL Soaj injection Commonly known as: EPI-PEN Inject 0.3 mg into the muscle as needed for anaphylaxis.   fluticasone 50 MCG/ACT nasal spray Commonly known as: FLONASE Place 2 sprays into both nostrils daily.   Fluticasone-Umeclidin-Vilant 100-62.5-25 MCG/INH Aepb Commonly known as: Trelegy Ellipta Inhale 1 puff into the lungs daily.   furosemide 20 MG tablet Commonly known as: LASIX Take 1 tablet (20 mg total) by mouth 2 (two) times daily.   hydrALAZINE 100 MG tablet Commonly known as: APRESOLINE Take 50 mg by mouth 3 (three) times daily.   hydroxyurea 500 MG capsule Commonly known as: HYDREA Take 1 capsule (500 mg total) by mouth daily. May take with food to minimize GI side effects.   levETIRAcetam 750 MG tablet Commonly known as: KEPPRA Take 750 mg by mouth 2 (two) times daily.   metFORMIN 500 MG tablet Commonly known as: GLUCOPHAGE Take 500 mg by mouth 2 (two) times daily with a meal.   metoprolol succinate 100 MG 24 hr tablet Commonly known as: TOPROL-XL Take 100 mg by mouth daily.   montelukast 10 MG tablet Commonly known as: SINGULAIR TAKE 1 TABLET BY MOUTH EVERY DAY   Multi-Vitamins Tabs Take 1 tablet by mouth daily.   Nifedical XL 30 MG 24 hr tablet Generic drug: NIFEdipine Take 30 mg by mouth daily.   OVER THE COUNTER MEDICATION Place 1 drop into both eyes daily. Allergy eye drops   sitaGLIPtin 25 MG tablet Commonly known as: JANUVIA Take 1 tablet by mouth daily.   traZODone 50 MG tablet Commonly known as: DESYREL Take 50-100 mg by mouth at bedtime as needed for sleep.   valsartan-hydrochlorothiazide 320-25 MG tablet Commonly known as: DIOVAN-HCT Take 1 tablet by mouth daily.   Ventolin HFA 108 (90 Base) MCG/ACT inhaler Generic drug: albuterol INHALE 2 PUFFS INTO THE LUNGS EVERY 6 HOURS AS NEEDED What changed: See the new instructions.   albuterol 1.25 MG/3ML nebulizer solution Commonly known as: ACCUNEB Take 3 mLs  (1.25 mg total) by nebulization every 6 (six) hours as needed. wheezing What changed: Another medication with the same name was changed. Make sure you understand how and when to take each.   VITAMIN B COMPLEX PO Take 1 tablet by mouth daily.   vitamin C 500 MG tablet Commonly known as: ASCORBIC ACID Take 500 mg by mouth 2 (two) times daily.            Durable Medical Equipment  (From admission, onward)         Start     Ordered   06/03/19 1125  DME Oxygen  Once    Comments: COPD and Diastolic CHF  Question Answer Comment  Length of Need Lifetime   Mode or (Route) Nasal cannula   Liters per Minute 2   Frequency Continuous (stationary and portable oxygen unit needed)   Oxygen conserving device Yes   Oxygen delivery system Gas      06/03/19 1125           DISCHARGE INSTRUCTIONS:    Follow with Cardiology clinic in 1-2  weeks.  If you experience worsening of your admission symptoms, develop shortness of breath, life threatening emergency, suicidal or homicidal thoughts you must seek medical attention immediately by calling 911 or calling your MD immediately  if symptoms less severe.  You Must read complete instructions/literature along with all the possible adverse reactions/side effects for all the Medicines you take and that have been prescribed to you. Take any new Medicines after you have completely understood and accept all the possible adverse reactions/side effects.   Please note  You were cared for by a hospitalist during your hospital stay. If you have any questions about your discharge medications or the care you received while you were in the hospital after you are discharged, you can call the unit and asked to speak with the hospitalist on call if the hospitalist that took care of you is not available. Once you are discharged, your primary care physician will handle any further medical issues. Please note that NO REFILLS for any discharge medications will be  authorized once you are discharged, as it is imperative that you return to your primary care physician (or establish a relationship with a primary care physician if you do not have one) for your aftercare needs so that they can reassess your need for medications and monitor your lab values.    Today   CHIEF COMPLAINT:   Chief Complaint  Patient presents with  . Shortness of Breath    HISTORY OF PRESENT ILLNESS:  Annaleigh Steinmeyer  is a 78 y.o. female with a known history of hypertension, hyperlipidemia, COPD, type 2 diabetes, CKD, history of seizures, OSA on CPAP who presented to the ED with shortness of breath over the last couple days.  Patient states that she has had periods of significant shortness of breath in the past, mostly related to the weather and her allergies.  She notes that the shortness of breath is worse with ambulation.  She denies any chest pain.  She endorses cough productive of yellow sputum.  She denies any fevers or chills.  She endorses chronic orthopnea.  On arrival to the ED, she had markedly increased work of breathing and was placed on BiPAP for approximately 2 hours.  She was then transitioned to 5 L nasal cannula and was maintaining her O2 sats greater than 92%.  Labs were significant for BNP 395, WBC 10.6.  UA was unremarkable.  Chest x-ray with mild interstitial edema and small bilateral pleural effusions.  She was given a dose of Lasix and a dose of IV Solu-Medrol.  Hospitalists were called for admission.   VITAL SIGNS:  Blood pressure 132/65, pulse 64, temperature 98.8 F (37.1 C), temperature source Oral, resp. rate 18, height 5\' 7"  (1.702 m), weight 103.6 kg, SpO2 96 %.  I/O:    Intake/Output Summary (Last 24 hours) at 06/03/2019 1125 Last data filed at 06/03/2019 2595 Gross per 24 hour  Intake 1080 ml  Output 2100 ml  Net -1020 ml    PHYSICAL EXAMINATION:  GENERAL:  78 y.o.-year-old patient lying in the bed with no acute distress.  EYES: Pupils equal,  round, reactive to light and accommodation. No scleral icterus. Extraocular muscles intact.  HEENT: Head atraumatic, normocephalic. Oropharynx and nasopharynx clear.  NECK:  Supple, no jugular venous distention. No thyroid enlargement, no tenderness.  LUNGS: Normal breath sounds bilaterally, no wheezing, rales,rhonchi or crepitation. No use of accessory muscles of respiration.  CARDIOVASCULAR: S1, S2 normal. No murmurs, rubs, or gallops.  ABDOMEN: Soft, non-tender, non-distended.  Bowel sounds present. No organomegaly or mass.  EXTREMITIES: No pedal edema, cyanosis, or clubbing.  NEUROLOGIC: Cranial nerves II through XII are intact. Muscle strength 5/5 in all extremities. Sensation intact. Gait not checked.  PSYCHIATRIC: The patient is alert and oriented x 3.  SKIN: No obvious rash, lesion, or ulcer.   DATA REVIEW:   CBC Recent Labs  Lab 06/03/19 0455  WBC 13.2*  HGB 12.7  HCT 38.2  PLT 295    Chemistries  Recent Labs  Lab 06/01/19 1027  06/03/19 0455  NA 142   < > 134*  K 4.0   < > 3.5  CL 104   < > 98  CO2 27   < > 27  GLUCOSE 129*   < > 130*  BUN 15   < > 30*  CREATININE 0.90   < > 0.93  CALCIUM 9.8   < > 8.9  AST 31  --   --   ALT 32  --   --   ALKPHOS 76  --   --   BILITOT 1.3*  --   --    < > = values in this interval not displayed.    Cardiac Enzymes No results for input(s): TROPONINI in the last 168 hours.  Microbiology Results  Results for orders placed or performed during the hospital encounter of 06/01/19  SARS Coronavirus 2 (CEPHEID - Performed in [place] hospital lab), Hosp Order     Status: None   Collection Time: 06/01/19 10:46 AM   Specimen: Nasopharyngeal Swab  Result Value Ref Range Status   SARS Coronavirus 2 NEGATIVE NEGATIVE Final    Comment: (NOTE) If result is NEGATIVE SARS-CoV-2 target nucleic acids are NOT DETECTED. The SARS-CoV-2 RNA is generally detectable in upper and lower  respiratory specimens during the acute phase of  infection. The lowest  concentration of SARS-CoV-2 viral copies this assay can detect is 250  copies / mL. A negative result does not preclude SARS-CoV-2 infection  and should not be used as the sole basis for treatment or other  patient management decisions.  A negative result may occur with  improper specimen collection / handling, submission of specimen other  than nasopharyngeal swab, presence of viral mutation(s) within the  areas targeted by this assay, and inadequate number of viral copies  (<250 copies / mL). A negative result must be combined with clinical  observations, patient history, and epidemiological information. If result is POSITIVE SARS-CoV-2 target nucleic acids are DETECTED. The SARS-CoV-2 RNA is generally detectable in upper and lower  respiratory specimens dur ing the acute phase of infection.  Positive  results are indicative of active infection with SARS-CoV-2.  Clinical  correlation with patient history and other diagnostic information is  necessary to determine patient infection status.  Positive results do  not rule out bacterial infection or co-infection with other viruses. If result is PRESUMPTIVE POSTIVE SARS-CoV-2 nucleic acids MAY BE PRESENT.   A presumptive positive result was obtained on the submitted specimen  and confirmed on repeat testing.  While 2019 novel coronavirus  (SARS-CoV-2) nucleic acids may be present in the submitted sample  additional confirmatory testing may be necessary for epidemiological  and / or clinical management purposes  to differentiate between  SARS-CoV-2 and other Sarbecovirus currently known to infect humans.  If clinically indicated additional testing with an alternate test  methodology [phone]) is advised. The SARS-CoV-2 RNA is generally  detectable in upper and lower respiratory sp ecimens during the  acute  phase of infection. The expected result is Negative. Fact Sheet for Patients:   StrictlyIdeas.no Fact Sheet for Healthcare Providers: BankingDealers.co.za This test is not yet approved or cleared by the [place] FDA and has been authorized for detection and/or diagnosis of SARS-CoV-2 by FDA under an Emergency Use Authorization (EUA).  This EUA will remain in effect (meaning this test can be used) for the duration of the COVID-19 declaration under Section 564(b)(1) of the Act, 21 U.S.C. section 360bbb-3(b)(1), unless the authorization is terminated or revoked sooner. Performed at [facility], [address] [place], [place] [address]     RADIOLOGY:  Dg Chest 2 View  Result Date: 06/03/2019 CLINICAL DATA:  Congestive heart failure. EXAM: CHEST - 2 VIEW COMPARISON:  06/01/2019 FINDINGS: Stable top-normal heart size. Diminishment in pulmonary edema since the prior study with venous hypertensive changes remaining. Small bilateral pleural effusions remain, left greater than right. No focal airspace disease. IMPRESSION: Diminishment in pulmonary edema since the prior study. Small bilateral pleural effusions remain, left greater than right. Electronically Signed   By: Aletta Edouard M.D.   On: 06/03/2019 07:55   US Venous Img Lower Unilateral Right  Result Date: 06/02/2019 CLINICAL DATA:  Swelling EXAM: RIGHT LOWER EXTREMITY VENOUS DOPPLER ULTRASOUND TECHNIQUE: Gray-scale sonography with compression, as well as color and duplex ultrasound, were performed to evaluate the deep venous system from the level of the common femoral vein through the popliteal and proximal calf veins. COMPARISON:  04/30/2014 FINDINGS: Normal compressibility of the common femoral, superficial femoral, and popliteal veins, as well as the proximal calf veins. No filling defects to suggest DVT on grayscale or color Doppler imaging. Doppler waveforms show normal direction of venous flow, normal respiratory phasicity and response to augmentation.  Survey views of the contralateral common femoral vein are unremarkable. IMPRESSION: No femoropopliteal and no calf DVT in the visualized calf veins. If clinical symptoms are inconsistent or if there are persistent or worsening symptoms, further imaging (possibly involving the iliac veins) may be warranted. Electronically Signed   By: Lucrezia Europe M.D.   On: 06/02/2019 08:12    EKG:   Orders placed or performed during the hospital encounter of 06/01/19  . ED EKG  . ED EKG      Management plans discussed with the patient, family and they are in agreement.  CODE STATUS:     Code Status Orders  (From admission, onward)         Start     Ordered   06/01/19 1754  Full code  Continuous     06/01/19 1753        Code Status History    Date Active Date Inactive Code Status Order ID Comments User Context   08/06/2016 1639 08/08/2016 1545 Full Code [id number]  Henreitta Leber, MD Inpatient   Advance Care Planning Activity    Advance Directive Documentation     Most Recent Value  Type of Advance Directive  Healthcare Power of [place], Living will  Pre-existing out of facility DNR order (yellow form or pink MOST form)  -  "MOST" Form in Place?  -      TOTAL TIME TAKING CARE OF THIS PATIENT: 35 minutes.    Vaughan Basta M.D on 06/03/2019 at 11:25 AM  Between 7am to 6pm - Pager - [phone]  After 6pm go to [email or web address] - password [email or web address] [facility]  Office  [phone]  CC: Primary care physician; Sofie Hartigan, MD  Note: This dictation was prepared with Dragon dictation along with smaller phrase technology. Any transcriptional errors that result from this process are unintentional.

## 2019-06-03 NOTE — Progress Notes (Signed)
Pt had great effort with PFT. Hard copy placed on chart.

## 2019-06-03 NOTE — TOC Transition Note (Addendum)
Transition of Care [facility]) - CM/SW Discharge Note   Patient Details  Name: Angelica Juarez MRN: [id number] Date of Birth: [birth year]  [facility] CM/SW Contact:  Latanya Maudlin, RN Phone Number: 06/03/2019, 11:36 AM   Clinical Narrative: Patient to be discharged per MD order. Orders in place for home health services. Patient also requiring chronic O2. CMS Medicare.gov Compare Post Acute Care list reviewed with patient and she has no preference of DME agency or home health agency. [facility] with [facility], Corene Cornea able to accept referral. Notified Brad with [facility] of need for new O2, [facility] will be able to provide and we will plan for bedside delivery of oxygen. Patient has RW and requires no other DME.  Update- patient in need of NIV. Brad from [facility] will obtain. Narrative in place and signed.      Final next level of care: [place] Barriers to Discharge: No Barriers Identified   Patient Goals and CMS Choice Patient states their goals for this hospitalization and ongoing recovery are:: just to make sure everyhitng is good to go CMS Medicare.gov Compare Post Acute Care list provided to:: Patient Choice offered to / list presented to : Patient  Discharge Placement                       Discharge Plan and Services                DME Arranged: Oxygen DME Agency: [facility] Date DME Agency Contacted: 06/03/19 Time DME Agency Contacted: 2060 Representative spoke with at DME Agency: brad HH Arranged: RN, PT, OT [place] Agency: [place] ([place]) Date [place]: 06/03/19 Time [place]: Anna Representative spoke with at [place]: [place] ([place]) Interventions     Readmission Risk Interventions Readmission Risk Prevention Plan 06/03/2019  Transportation Screening Complete  PCP or Specialist Appt within 5-7 Days Complete  Home Care Screening Complete  Medication  Review (RN CM) Complete  Some recent data might be hidden

## 2019-06-03 NOTE — Progress Notes (Signed)
Physical Therapy Treatment Patient Details Name: OWEN PAGNOTTA MRN: [id number] DOB: 23-Mar-1941 Today's Date: 06/03/2019    History of Present Illness 78 y.o. female with a known history of hypertension, hyperlipidemia, COPD, type 2 diabetes, CKD, history of seizures, OSA on CPAP who presented to the ED with shortness of breath over the last couple days.Chest x-ray revealed mild interstitial edema and the patient was treated with intravenous furosemide and Solu-Medrol with clinical improvement.  ECG revealed atrial fibrillation with controlled rate of 80 bpm    PT Comments    Pt recently returned from walk with RN without AD.  Voiced some concerns regarding unsteady gait.  She agreed to walker trial and was able to walk around unit with walker and min guard/supervision.  While gait is improved some imbalances remain.  She has a walker and rollator at home from prior knee surgeries and she agreed to use them upon discharge.  Time spent on education for O2 cord management.  Voiced understanding.   Follow Up Recommendations  Home health PT     Equipment Recommendations  Rolling walker with 5" wheels    Recommendations for Other Services       Precautions / Restrictions Precautions Precautions: Fall Precaution Comments: monitor O2 Restrictions Weight Bearing Restrictions: No    Mobility  Bed Mobility Overal bed mobility: Needs Assistance Bed Mobility: Supine to Sit     Supine to sit: HOB elevated;Modified independent (Device/Increase time)     General bed mobility comments: in recliner  Transfers Overall transfer level: Needs assistance Equipment used: None;Rolling walker (2 wheeled) Transfers: Sit to/from Stand Sit to Stand: Supervision         General transfer comment: Verbal cues for O2 cord awareness  Ambulation/Gait Ambulation/Gait assistance: Supervision;Min guard Gait Distance (Feet): 200 Feet Assistive device: Rolling walker (2 wheeled)     Gait velocity  interpretation: <1.31 ft/sec, indicative of household ambulator General Gait Details: Pt felt more confident and steadier with RW vs no AD. Has RW and rollator at home from prior TKR surgeries.   Stairs             Wheelchair Mobility    Modified Rankin (Stroke Patients Only)       Balance Overall balance assessment: Needs assistance Sitting-balance support: Feet supported Sitting balance-Leahy Scale: Good Sitting balance - Comments: Steady reaching within BOS.   Standing balance support: Bilateral upper extremity supported Standing balance-Leahy Scale: Fair                              Cognition Arousal/Alertness: Awake/alert Behavior During Therapy: WFL for tasks assessed/performed Overall Cognitive Status: Within Functional Limits for tasks assessed                                 General Comments: Pleasant, cooperative, following VCs consistently.      Exercises Other Exercises Other Exercises: safety and O2 cord management Other Exercises: Pt educated on ECS including PLB, safe use of AE for improved functional independence and to minimize fatigue with activity, falls prevention strategies and routines modifications to support endurance and activity tolerance upon DC.    General Comments General comments (skin integrity, edema, etc.): SpO2 >90 while in bed at rest on 2.5L After AMB dropped to 85, but rebounded to >93 with rest break and cues for PLB.      Pertinent Vitals/Pain Pain Assessment:  No/denies pain    Home Living Family/patient expects to be discharged to:: Private residence Living Arrangements: Spouse/significant other Available Help at Discharge: Family;Available 24 hours/day Type of Home: House Home Access: Stairs to enter Entrance Stairs-Rails: Right Home Layout: One level Home Equipment: Walker - 4 wheels;Walker - 2 wheels;Shower seat - built in;Grab bars - tub/shower;Adaptive equipment Additional Comments: Pt  has suction grab-bars, doesn't feel very confident using them.    Prior Function Level of Independence: Independent      Comments: Pt has a housekeeper come twice a month to assist with cleaning. Pt and significant other eat out for all meals.   PT Goals (current goals can now be found in the care plan section) Acute Rehab PT Goals Patient Stated Goal: to go home Progress towards PT goals: Progressing toward goals    Frequency    Min 2X/week      PT Plan Current plan remains appropriate    Co-evaluation              AM-PAC PT "6 Clicks" Mobility   Outcome Measure  Help needed turning from your back to your side while in a flat bed without using bedrails?: A Little Help needed moving from lying on your back to sitting on the side of a flat bed without using bedrails?: A Little Help needed moving to and from a bed to a chair (including a wheelchair)?: None Help needed standing up from a chair using your arms (e.g., wheelchair or bedside chair)?: None Help needed to walk in hospital room?: A Little Help needed climbing 3-5 steps with a railing? : A Little 6 Click Score: 20    End of Session Equipment Utilized During Treatment: Gait belt;Oxygen Activity Tolerance: Patient tolerated treatment well Patient left: in chair;with call bell/phone within reach Nurse Communication: Mobility status       Time: 1740-8144 PT Time Calculation (min) (ACUTE ONLY): 15 min  Charges:  $Gait Training: 8-22 mins                    Chesley Noon, PTA 06/03/19, 12:01 PM

## 2019-06-03 NOTE — Progress Notes (Signed)
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 Patient requires the use of NIV both QHS and daytime to help with exacerbation periods. The use of the NIV will treat patient's high PC02 levels and can reduce risk of exacerbations and future hospitalizations when used at night and during the day. Pt will need these advanced settings in conjunction with her current medication regimen; BIPAP is not an option due to its functional limitations and the severity of the patient's condition. Failure to have NIV available for use over a 24 hour period could result in serious injury or death.

## 2019-06-07 ENCOUNTER — Telehealth: Payer: Self-pay

## 2019-06-07 NOTE — Telephone Encounter (Signed)
GAVE VERBAL ORDER HOME HEALTH NURSING [facility] [phone] 1 TIMES A WEEK FOR 1 WEEK AND 2 TIMES A WEEK  FOR 1 WEEK AND 1 TIMES A WEEK FOR 4 WEEK AND 1 TIMES A WEEK FOR EVERY OTHER WEEK FOR 4 WEEKS AND ALSO NURSE ADVISED THAT PT NEED HOSPITAL FOLLOW

## 2019-06-13 ENCOUNTER — Ambulatory Visit: Payer: Medicare Other

## 2019-06-19 ENCOUNTER — Ambulatory Visit (INDEPENDENT_AMBULATORY_CARE_PROVIDER_SITE_OTHER): Payer: Medicare Other | Admitting: Adult Health

## 2019-06-19 ENCOUNTER — Other Ambulatory Visit: Payer: Self-pay

## 2019-06-19 VITALS — BP 107/76 | HR 64 | Resp 16 | Ht 67.0 in | Wt 225.0 lb

## 2019-06-19 DIAGNOSIS — J449 Chronic obstructive pulmonary disease, unspecified: Secondary | ICD-10-CM | POA: Diagnosis not present

## 2019-06-19 DIAGNOSIS — J301 Allergic rhinitis due to pollen: Secondary | ICD-10-CM | POA: Diagnosis not present

## 2019-06-19 DIAGNOSIS — G4734 Idiopathic sleep related nonobstructive alveolar hypoventilation: Secondary | ICD-10-CM | POA: Diagnosis not present

## 2019-06-19 DIAGNOSIS — G4733 Obstructive sleep apnea (adult) (pediatric): Secondary | ICD-10-CM

## 2019-06-19 DIAGNOSIS — J9611 Chronic respiratory failure with hypoxia: Secondary | ICD-10-CM

## 2019-06-19 DIAGNOSIS — I509 Heart failure, unspecified: Secondary | ICD-10-CM

## 2019-06-19 DIAGNOSIS — Z9989 Dependence on other enabling machines and devices: Secondary | ICD-10-CM

## 2019-06-19 NOTE — Progress Notes (Addendum)
[facility], [place], [place] [address]  Pulmonary Sleep Medicine   Office Visit Note  Patient Name: Angelica Juarez DOB: [birth year] MRN [id number]  Date of Service: 06/19/2019  Complaints/HPI: PT is here for hospital follow up.  She reports she went to the hospital for difficulty breathing.  She had been short of breath that was getting progressively worse.  She was getting light headed, and so she went to [facility] clinic, who then sent her to the emergency room. Her admitting diagnoses was acute on chronic respiratory failure with hypoxia and acute on chronic CHF.  She was admitted for 2 days, and [facility] provided her with a new CPAP (that is compatible for night time oxygen) and a portable concentrator.  She does not like the portable concentrator because the battery only lasts one hour.  She also already has a CPAP machine that is current maintained and provided by [facility].  Her oxygen level today in office was 83% on room air, it came up to 93% with 2.5 lpm of oxygen.    ROS  General: (-) fever, (-) chills, (-) night sweats, (-) weakness Skin: (-) rashes, (-) itching,. Eyes: (-) visual changes, (-) redness, (-) itching. Nose and Sinuses: (-) nasal stuffiness or itchiness, (-) postnasal drip, (-) nosebleeds, (-) sinus trouble. Mouth and Throat: (-) sore throat, (-) hoarseness. Neck: (-) swollen glands, (-) enlarged thyroid, (-) neck pain. Respiratory: - cough, (-) bloody sputum, - shortness of breath, - wheezing. Cardiovascular: - ankle swelling, (-) chest pain. Lymphatic: (-) lymph node enlargement. Neurologic: (-) numbness, (-) tingling. Psychiatric: (-) anxiety, (-) depression   Current Medication: Outpatient Encounter Medications as of 06/19/2019  Medication Sig  . albuterol (ACCUNEB) 1.25 MG/3ML nebulizer solution Take 3 mLs (1.25 mg total) by nebulization every 6 (six) hours as needed. wheezing  . apixaban (ELIQUIS) 5 MG TABS tablet  Take 1 tablet (5 mg total) by mouth 2 (two) times daily.  Marland Kitchen aspirin 81 MG tablet Take 81 mg by mouth daily.  Marland Kitchen atorvastatin (LIPITOR) 40 MG tablet Take 40 mg by mouth daily.  . B Complex Vitamins (VITAMIN B COMPLEX PO) Take 1 tablet by mouth daily.  . Calcium Carbonate-Vitamin D 600-200 MG-UNIT CAPS Take 1 capsule by mouth daily.  . cloNIDine (CATAPRES) 0.2 MG tablet Take 0.2 mg by mouth 2 (two) times daily.  . diphenhydrAMINE (BENADRYL) 25 mg capsule Take 25 mg by mouth daily.  Marland Kitchen EPINEPHrine 0.3 mg/0.3 mL IJ SOAJ injection Inject 0.3 mg into the muscle as needed for anaphylaxis.   . fluticasone (FLONASE) 50 MCG/ACT nasal spray Place 2 sprays into both nostrils daily.  . Fluticasone-Umeclidin-Vilant (TRELEGY ELLIPTA) 100-62.5-25 MCG/INH AEPB Inhale 1 puff into the lungs daily.  . furosemide (LASIX) 20 MG tablet Take 1 tablet (20 mg total) by mouth 2 (two) times daily.  . hydrALAZINE (APRESOLINE) 100 MG tablet Take 50 mg by mouth 3 (three) times daily.   . hydroxyurea (HYDREA) 500 MG capsule Take 1 capsule (500 mg total) by mouth daily. May take with food to minimize GI side effects.  . levETIRAcetam (KEPPRA) 750 MG tablet Take 750 mg by mouth 2 (two) times daily.  . metFORMIN (GLUCOPHAGE) 500 MG tablet Take 500 mg by mouth 2 (two) times daily with a meal.  . metoprolol succinate (TOPROL-XL) 100 MG 24 hr tablet Take 100 mg by mouth daily.  . montelukast (SINGULAIR) 10 MG tablet TAKE 1 TABLET BY MOUTH EVERY DAY (Patient taking differently:  Take 10 mg by mouth daily. )  . Multiple Vitamin (MULTI-VITAMINS) TABS Take 1 tablet by mouth daily.  Marland Kitchen NIFEdipine (NIFEDICAL XL) 30 MG 24 hr tablet Take 30 mg by mouth daily.  Marland Kitchen OVER THE COUNTER MEDICATION Place 1 drop into both eyes daily. Allergy eye drops  . OXYGEN Inhale 2 L into the lungs.  . sitaGLIPtin (JANUVIA) 25 MG tablet Take 1 tablet by mouth daily.  . traZODone (DESYREL) 50 MG tablet Take 50-100 mg by mouth at bedtime as needed for sleep.   .  valsartan-hydrochlorothiazide (DIOVAN-HCT) 320-25 MG tablet Take 1 tablet by mouth daily.  . VENTOLIN HFA 108 (90 Base) MCG/ACT inhaler INHALE 2 PUFFS INTO THE LUNGS EVERY 6 HOURS AS NEEDED (Patient taking differently: Inhale 2 puffs into the lungs every 6 (six) hours as needed for wheezing or shortness of breath. )  . vitamin C (ASCORBIC ACID) 500 MG tablet Take 500 mg by mouth 2 (two) times daily.   No facility-administered encounter medications on file as of 06/19/2019.     Surgical History: Past Surgical History:  Procedure Laterality Date  . ABDOMINAL HYSTERECTOMY    . 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 EYE SURGERY     bilateral cataracts  . JOINT REPLACEMENT     bilateral knee replacement  . NASAL SEPTUM SURGERY    . OOPHORECTOMY    . REPLACEMENT TOTAL KNEE Bilateral 2004  . SHOULDER ARTHROSCOPY WITH BICEPSTENOTOMY Right 05/25/2016   Procedure: SHOULDER ARTHROSCOPY WITH BICEPSTENOTOMY;  Surgeon: Leanor Kail, MD;  Location: [facility] ORS;  Service: Orthopedics;  Laterality: Right;  . SHOULDER ARTHROSCOPY WITH DISTAL CLAVICLE RESECTION Right 05/25/2016   Procedure: SHOULDER ARTHROSCOPY WITH DISTAL CLAVICLE RESECTION;  Surgeon: Leanor Kail, MD;  Location: [facility] ORS;  Service: Orthopedics;  Laterality: Right;  . SHOULDER ARTHROSCOPY WITH OPEN ROTATOR CUFF REPAIR Right 05/25/2016   Procedure: SHOULDER ARTHROSCOPY WITH OPEN ROTATOR CUFF REPAIR;  Surgeon: Leanor Kail, MD;  Location: [facility] ORS;  Service: Orthopedics;  Laterality: Right;  . SUBACROMIAL DECOMPRESSION Right 05/25/2016   Procedure: SUBACROMIAL DECOMPRESSION;  Surgeon: Leanor Kail, MD;  Location: [facility] ORS;  Service: Orthopedics;  Laterality: Right;    Medical History: Past Medical History:  Diagnosis Date  . Allergy    Seasonal  . Arthritis   . 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 Chronic kidney disease    chronic renal insufficiency  . Colon polyps 05/21/14  . COPD (chronic obstructive  pulmonary disease) ([employer])   . Diabetes mellitus without complication ([place])   . Dyspnea   . Fracture closed, humerus, shaft    right   . History of kidney stones    40 years ago  . Hyperlipidemia 04/30/14  . Hypertension   . Impingement syndrome of right shoulder 12/29/15  . Personal history of radiation therapy   . Pneumonia    hx  . Rotator cuff tear 04/21/16   right  . Seizures ([place])   . Sleep apnea     Family History: Family History  Problem Relation Age of Onset  . Breast cancer Paternal Aunt 6  . Diabetes Father   . Heart disease Father     Social History: Social History   Socioeconomic History  . Marital status: Single    Spouse name: Not on file  . Number of children: Not on file  . Years of education: Not on file  . Highest education level: Not on file  Occupational History  .  Not on file  Social Needs  . Financial resource strain: Not on file  . Food insecurity    Worry: Not on file    Inability: Not on file  . Transportation needs    Medical: Not on file    Non-medical: Not on file  Tobacco Use  . Smoking status: Former Smoker    Packs/day: 1.00    Years: 40.00    Pack years: 40.00    Quit date: 05/12/2005    Years since quitting: 14.1  . Smokeless tobacco: Never Used  Substance and Sexual Activity  . Alcohol use: No  . Drug use: No  . Sexual activity: Not on file  Lifestyle  . Physical activity    Days per week: Not on file    Minutes per session: Not on file  . Stress: Not on file  Relationships  . Social Herbalist on phone: Not on file    Gets together: Not on file    Attends religious service: Not on file    Active member of club or organization: Not on file    Attends meetings of clubs or organizations: Not on file    Relationship status: Not on file  . Intimate partner violence    Fear of current or ex partner: Not on file    Emotionally abused: Not on file    Physically abused: Not on file    Forced sexual activity: Not  on file  Other Topics Concern  . Not on file  Social History Narrative  . Not on file    Vital Signs: Blood pressure 107/76, pulse 64, resp. rate 16, height 5\' 7"  (1.702 m), weight 225 lb (102.1 kg), SpO2 93 %.  Examination: General Appearance: The patient is well-developed, well-nourished, and in no distress. Skin: Gross inspection of skin unremarkable. Head: normocephalic, no gross deformities. Eyes: no gross deformities noted. ENT: ears appear grossly normal no exudates. Neck: Supple. No thyromegaly. No LAD. Respiratory: clear bilateraly. Cardiovascular: Normal S1 and S2 without murmur or rub. Extremities: No cyanosis. pulses are equal. Neurologic: Alert and oriented. No involuntary movements.  LABS: Recent Results (from the past 2160 hour(s))  Comprehensive metabolic panel     Status: Abnormal   Collection Time: 05/09/19  1:25 PM  Result Value Ref Range   Sodium 139 135 - 145 mmol/L   Potassium 4.1 3.5 - 5.1 mmol/L   Chloride 104 98 - 111 mmol/L   CO2 25 22 - 32 mmol/L   Glucose, Bld 101 (H) 70 - 99 mg/dL   BUN 16 8 - 23 mg/dL   Creatinine, Ser 0.96 0.44 - 1.00 mg/dL   Calcium 9.8 8.9 - 10.3 mg/dL   Total Protein 6.5 6.5 - 8.1 g/dL   Albumin 4.0 3.5 - 5.0 g/dL   AST 43 (H) 15 - 41 U/L   ALT 56 (H) 0 - 44 U/L   Alkaline Phosphatase 77 38 - 126 U/L   Total Bilirubin 1.2 0.3 - 1.2 mg/dL   GFR calc non Af Amer 57 (L) >60 mL/min   GFR calc Af Amer >60 >60 mL/min   Anion gap 10 5 - 15    Comment: Performed at [facility], [address], [place], [place] [address]  CBC with Differential/Platelet     Status: Abnormal   Collection Time: 05/09/19  1:25 PM  Result Value Ref Range   WBC 12.2 (H) 4.0 - 10.5 K/uL   RBC 4.03 3.87 - 5.11  MIL/uL   Hemoglobin 13.8 12.0 - 15.0 g/dL   HCT 39.7 36.0 - 46.0 %   MCV 98.5 80.0 - 100.0 fL   MCH 34.2 (H) 26.0 - 34.0 pg   MCHC 34.8 30.0 - 36.0 g/dL   RDW 12.9 11.5 - 15.5 %   Platelets 353 150 - 400 K/uL   nRBC 0.0 0.0  - 0.2 %   Neutrophils Relative % 79 %   Neutro Abs 9.6 (H) 1.7 - 7.7 K/uL   Lymphocytes Relative 6 %   Lymphs Abs 0.7 0.7 - 4.0 K/uL   Monocytes Relative 9 %   Monocytes Absolute 1.1 (H) 0.1 - 1.0 K/uL   Eosinophils Relative 4 %   Eosinophils Absolute 0.5 0.0 - 0.5 K/uL   Basophils Relative 1 %   Basophils Absolute 0.1 0.0 - 0.1 K/uL   Immature Granulocytes 1 %   Abs Immature Granulocytes 0.10 (H) 0.00 - 0.07 K/uL    Comment: Performed at [facility], [address] [place], [place] [address]  Hepatic function panel     Status: None   Collection Time: 05/23/19  2:27 PM  Result Value Ref Range   Total Protein 6.5 6.5 - 8.1 g/dL   Albumin 3.9 3.5 - 5.0 g/dL   AST 32 15 - 41 U/L   ALT 34 0 - 44 U/L   Alkaline Phosphatase 86 38 - 126 U/L   Total Bilirubin 0.9 0.3 - 1.2 mg/dL   Bilirubin, Direct 0.2 0.0 - 0.2 mg/dL   Indirect Bilirubin 0.7 0.3 - 0.9 mg/dL    Comment: Performed at [facility], [address], [place], [place] [address]  CBC with Differential/Platelet     Status: Abnormal   Collection Time: 05/23/19  2:27 PM  Result Value Ref Range   WBC 13.8 (H) 4.0 - 10.5 K/uL   RBC 4.01 3.87 - 5.11 MIL/uL   Hemoglobin 13.6 12.0 - 15.0 g/dL   HCT 40.2 36.0 - 46.0 %   MCV 100.2 (H) 80.0 - 100.0 fL   MCH 33.9 26.0 - 34.0 pg   MCHC 33.8 30.0 - 36.0 g/dL   RDW 13.6 11.5 - 15.5 %   Platelets 296 150 - 400 K/uL   nRBC 0.0 0.0 - 0.2 %   Neutrophils Relative % 82 %   Neutro Abs 11.4 (H) 1.7 - 7.7 K/uL   Lymphocytes Relative 5 %   Lymphs Abs 0.7 0.7 - 4.0 K/uL   Monocytes Relative 7 %   Monocytes Absolute 0.9 0.1 - 1.0 K/uL   Eosinophils Relative 4 %   Eosinophils Absolute 0.5 0.0 - 0.5 K/uL   Basophils Relative 1 %   Basophils Absolute 0.1 0.0 - 0.1 K/uL   Immature Granulocytes 1 %   Abs Immature Granulocytes 0.14 (H) 0.00 - 0.07 K/uL    Comment: Performed at [facility], [address] [place], [place] [address]  Comprehensive  metabolic panel     Status: Abnormal   Collection Time: 06/01/19 10:27 AM  Result Value Ref Range   Sodium 142 135 - 145 mmol/L   Potassium 4.0 3.5 - 5.1 mmol/L   Chloride 104 98 - 111 mmol/L   CO2 27 22 - 32 mmol/L   Glucose, Bld 129 (H) 70 - 99 mg/dL   BUN 15 8 - 23 mg/dL   Creatinine, Ser 0.90 0.44 - 1.00 mg/dL   Calcium 9.8 8.9 - 10.3 mg/dL   Total Protein 6.9 6.5 -  8.1 g/dL   Albumin 4.4 3.5 - 5.0 g/dL   AST 31 15 - 41 U/L   ALT 32 0 - 44 U/L   Alkaline Phosphatase 76 38 - 126 U/L   Total Bilirubin 1.3 (H) 0.3 - 1.2 mg/dL   GFR calc non Af Amer >60 >60 mL/min   GFR calc Af Amer >60 >60 mL/min   Anion gap 11 5 - 15    Comment: Performed at [facility], [address] [place], [place] [address]  CBC with Differential     Status: Abnormal   Collection Time: 06/01/19 10:27 AM  Result Value Ref Range   WBC 10.6 (H) 4.0 - 10.5 K/uL   RBC 4.19 3.87 - 5.11 MIL/uL   Hemoglobin 14.1 12.0 - 15.0 g/dL   HCT 42.9 36.0 - 46.0 %   MCV 102.4 (H) 80.0 - 100.0 fL   MCH 33.7 26.0 - 34.0 pg   MCHC 32.9 30.0 - 36.0 g/dL   RDW 14.3 11.5 - 15.5 %   Platelets 326 150 - 400 K/uL   nRBC 0.0 0.0 - 0.2 %   Neutrophils Relative % 84 %   Neutro Abs 8.9 (H) 1.7 - 7.7 K/uL   Lymphocytes Relative 4 %   Lymphs Abs 0.4 (L) 0.7 - 4.0 K/uL   Monocytes Relative 7 %   Monocytes Absolute 0.8 0.1 - 1.0 K/uL   Eosinophils Relative 3 %   Eosinophils Absolute 0.3 0.0 - 0.5 K/uL   Basophils Relative 1 %   Basophils Absolute 0.1 0.0 - 0.1 K/uL   Immature Granulocytes 1 %   Abs Immature Granulocytes 0.09 (H) 0.00 - 0.07 K/uL    Comment: Performed at [facility], [place], [place] [address]  Troponin I (High Sensitivity)     Status: None   Collection Time: 06/01/19 10:27 AM  Result Value Ref Range   Troponin I (High Sensitivity) 9 <18 ng/L    Comment: (NOTE) Elevated high sensitivity troponin I (hsTnI) values and significant  changes across serial measurements may  suggest ACS but many other  chronic and acute conditions are known to elevate hsTnI results.  Refer to the "Links" section for chest pain algorithms and additional  guidance. Performed at [facility], [address] [place], [place] [address]   Brain natriuretic peptide     Status: Abnormal   Collection Time: 06/01/19 10:28 AM  Result Value Ref Range   B Natriuretic Peptide 359.0 (H) 0.0 - 100.0 pg/mL    Comment: Performed at [facility], [address] , [place] [address]  Lactic acid, plasma     Status: None   Collection Time: 06/01/19 10:46 AM  Result Value Ref Range   Lactic Acid, Venous 1.3 0.5 - 1.9 mmol/L    Comment: Performed at [facility], [address] [place], [place] [address]  Urinalysis, Complete w Microscopic     Status: Abnormal   Collection Time: 06/01/19 10:46 AM  Result Value Ref Range   Color, Urine YELLOW (A) YELLOW   APPearance HAZY (A) CLEAR   Specific Gravity, Urine 1.006 1.005 - 1.030   pH 6.0 5.0 - 8.0   Glucose, UA NEGATIVE NEGATIVE mg/dL   Hgb urine dipstick NEGATIVE NEGATIVE   Bilirubin Urine NEGATIVE NEGATIVE   Ketones, ur NEGATIVE NEGATIVE mg/dL   Protein, ur 30 (A) NEGATIVE mg/dL   Nitrite NEGATIVE NEGATIVE   Leukocytes,Ua TRACE (A) NEGATIVE   RBC / HPF 0-5  0 - 5 RBC/hpf   WBC, UA 0-5 0 - 5 WBC/hpf   Bacteria, UA NONE SEEN NONE SEEN   Squamous Epithelial / LPF 0-5 0 - 5   Mucus PRESENT     Comment: Performed at [facility], [address], [place], [place] [address]  SARS Coronavirus 2 (CEPHEID - Performed in [place] hospital lab), Hosp Order     Status: None   Collection Time: 06/01/19 10:46 AM   Specimen: Nasopharyngeal Swab  Result Value Ref Range   SARS Coronavirus 2 NEGATIVE NEGATIVE    Comment: (NOTE) If result is NEGATIVE SARS-CoV-2 target nucleic acids are NOT DETECTED. The SARS-CoV-2 RNA is generally detectable in upper and lower  respiratory specimens during the acute  phase of infection. The lowest  concentration of SARS-CoV-2 viral copies this assay can detect is 250  copies / mL. A negative result does not preclude SARS-CoV-2 infection  and should not be used as the sole basis for treatment or other  patient management decisions.  A negative result may occur with  improper specimen collection / handling, submission of specimen other  than nasopharyngeal swab, presence of viral mutation(s) within the  areas targeted by this assay, and inadequate number of viral copies  (<250 copies / mL). A negative result must be combined with clinical  observations, patient history, and epidemiological information. If result is POSITIVE SARS-CoV-2 target nucleic acids are DETECTED. The SARS-CoV-2 RNA is generally detectable in upper and lower  respiratory specimens dur ing the acute phase of infection.  Positive  results are indicative of active infection with SARS-CoV-2.  Clinical  correlation with patient history and other diagnostic information is  necessary to determine patient infection status.  Positive results do  not rule out bacterial infection or co-infection with other viruses. If result is PRESUMPTIVE POSTIVE SARS-CoV-2 nucleic acids MAY BE PRESENT.   A presumptive positive result was obtained on the submitted specimen  and confirmed on repeat testing.  While 2019 novel coronavirus  (SARS-CoV-2) nucleic acids may be present in the submitted sample  additional confirmatory testing may be necessary for epidemiological  and / or clinical management purposes  to differentiate between  SARS-CoV-2 and other Sarbecovirus currently known to infect humans.  If clinically indicated additional testing with an alternate test  methodology [phone]) is advised. The SARS-CoV-2 RNA is generally  detectable in upper and lower respiratory sp ecimens during the acute  phase of infection. The expected result is Negative. Fact Sheet for Patients:   StrictlyIdeas.no Fact Sheet for Healthcare Providers: BankingDealers.co.za This test is not yet approved or cleared by the [place] FDA and has been authorized for detection and/or diagnosis of SARS-CoV-2 by FDA under an Emergency Use Authorization (EUA).  This EUA will remain in effect (meaning this test can be used) for the duration of the COVID-19 declaration under Section 564(b)(1) of the Act, 21 U.S.C. section 360bbb-3(b)(1), unless the authorization is terminated or revoked sooner. Performed at [facility], [address] [place], [place] [address]   Lactic acid, plasma     Status: None   Collection Time: 06/01/19 12:42 PM  Result Value Ref Range   Lactic Acid, Venous 0.9 0.5 - 1.9 mmol/L    Comment: Performed at [facility], [address] [place], [place] [address]  Troponin I (High Sensitivity)     Status: None   Collection Time: 06/01/19 12:42 PM  Result Value Ref Range   Troponin I (High Sensitivity) 8 <18  ng/L    Comment: (NOTE) Elevated high sensitivity troponin I (hsTnI) values and significant  changes across serial measurements may suggest ACS but many other  chronic and acute conditions are known to elevate hsTnI results.  Refer to the "Links" section for chest pain algorithms and additional  guidance. Performed at [facility], [address] [place], [place] [address]   Glucose, capillary     Status: Abnormal   Collection Time: 06/01/19  9:08 PM  Result Value Ref Range   Glucose-Capillary 334 (H) 70 - 99 mg/dL  Basic metabolic panel     Status: Abnormal   Collection Time: 06/02/19  5:08 AM  Result Value Ref Range   Sodium 139 135 - 145 mmol/L   Potassium 3.9 3.5 - 5.1 mmol/L   Chloride 101 98 - 111 mmol/L   CO2 28 22 - 32 mmol/L   Glucose, Bld 128 (H) 70 - 99 mg/dL   BUN 19 8 - 23 mg/dL   Creatinine, Ser 0.81 0.44 - 1.00 mg/dL   Calcium 9.6 8.9 - 10.3 mg/dL   GFR calc  non Af Amer >60 >60 mL/min   GFR calc Af Amer >60 >60 mL/min   Anion gap 10 5 - 15    Comment: Performed at [facility], [address] [place], [place] [address]  CBC     Status: Abnormal   Collection Time: 06/02/19  5:08 AM  Result Value Ref Range   WBC 15.1 (H) 4.0 - 10.5 K/uL   RBC 3.97 3.87 - 5.11 MIL/uL   Hemoglobin 13.2 12.0 - 15.0 g/dL   HCT 40.3 36.0 - 46.0 %   MCV 101.5 (H) 80.0 - 100.0 fL   MCH 33.2 26.0 - 34.0 pg   MCHC 32.8 30.0 - 36.0 g/dL   RDW 13.9 11.5 - 15.5 %   Platelets 323 150 - 400 K/uL   nRBC 0.0 0.0 - 0.2 %    Comment: Performed at [facility], [address] [place], [place] [address]  ECHOCARDIOGRAM COMPLETE     Status: None   Collection Time: 06/02/19  7:00 AM  Result Value Ref Range   Weight 3,664 oz   Height 67 in   BP 163/76 mmHg  Glucose, capillary     Status: Abnormal   Collection Time: 06/02/19  8:04 AM  Result Value Ref Range   Glucose-Capillary 131 (H) 70 - 99 mg/dL  Glucose, capillary     Status: Abnormal   Collection Time: 06/02/19 11:42 AM  Result Value Ref Range   Glucose-Capillary 197 (H) 70 - 99 mg/dL  Glucose, capillary     Status: Abnormal   Collection Time: 06/02/19  4:31 PM  Result Value Ref Range   Glucose-Capillary 132 (H) 70 - 99 mg/dL  Glucose, capillary     Status: Abnormal   Collection Time: 06/02/19  8:51 PM  Result Value Ref Range   Glucose-Capillary 180 (H) 70 - 99 mg/dL  CBC     Status: Abnormal   Collection Time: 06/03/19  4:55 AM  Result Value Ref Range   WBC 13.2 (H) 4.0 - 10.5 K/uL   RBC 3.81 (L) 3.87 - 5.11 MIL/uL   Hemoglobin 12.7 12.0 - 15.0 g/dL   HCT 38.2 36.0 - 46.0 %   MCV 100.3 (H) 80.0 - 100.0 fL   MCH 33.3 26.0 - 34.0 pg   MCHC 33.2 30.0 - 36.0 g/dL   RDW 13.7 11.5 - 15.5 %   Platelets 295  150 - 400 K/uL   nRBC 0.0 0.0 - 0.2 %    Comment: Performed at [facility], [address] [place], [place] [address]  Basic metabolic panel     Status: Abnormal    Collection Time: 06/03/19  4:55 AM  Result Value Ref Range   Sodium 134 (L) 135 - 145 mmol/L   Potassium 3.5 3.5 - 5.1 mmol/L   Chloride 98 98 - 111 mmol/L   CO2 27 22 - 32 mmol/L   Glucose, Bld 130 (H) 70 - 99 mg/dL   BUN 30 (H) 8 - 23 mg/dL   Creatinine, Ser 0.93 0.44 - 1.00 mg/dL   Calcium 8.9 8.9 - 10.3 mg/dL   GFR calc non Af Amer 60 (L) >60 mL/min   GFR calc Af Amer >60 >60 mL/min   Anion gap 9 5 - 15    Comment: Performed at [facility], [address] [place],  [address]  Glucose, capillary     Status: Abnormal   Collection Time: 06/03/19  8:02 AM  Result Value Ref Range   Glucose-Capillary 122 (H) 70 - 99 mg/dL  Glucose, capillary     Status: Abnormal   Collection Time: 06/03/19 11:23 AM  Result Value Ref Range   Glucose-Capillary 177 (H) 70 - 99 mg/dL    Radiology: Dg Chest 2 View  Result Date: 06/03/2019 CLINICAL DATA:  Congestive heart failure. EXAM: CHEST - 2 VIEW COMPARISON:  06/01/2019 FINDINGS: Stable top-normal heart size. Diminishment in pulmonary edema since the prior study with venous hypertensive changes remaining. Small bilateral pleural effusions remain, left greater than right. No focal airspace disease. IMPRESSION: Diminishment in pulmonary edema since the prior study. Small bilateral pleural effusions remain, left greater than right. Electronically Signed   By: Aletta Edouard M.D.   On: 06/03/2019 07:55   US Venous Img Lower Unilateral Right  Result Date: 06/02/2019 CLINICAL DATA:  Swelling EXAM: RIGHT LOWER EXTREMITY VENOUS DOPPLER ULTRASOUND TECHNIQUE: Gray-scale sonography with compression, as well as color and duplex ultrasound, were performed to evaluate the deep venous system from the level of the common femoral vein through the popliteal and proximal calf veins. COMPARISON:  04/30/2014 FINDINGS: Normal compressibility of the common femoral, superficial femoral, and popliteal veins, as well as the proximal calf veins. No filling  defects to suggest DVT on grayscale or color Doppler imaging. Doppler waveforms show normal direction of venous flow, normal respiratory phasicity and response to augmentation. Survey views of the contralateral common femoral vein are unremarkable. IMPRESSION: No femoropopliteal and no calf DVT in the visualized calf veins. If clinical symptoms are inconsistent or if there are persistent or worsening symptoms, further imaging (possibly involving the iliac veins) may be warranted. Electronically Signed   By: Lucrezia Europe M.D.   On: 06/02/2019 08:12    No results found.  Dg Chest 2 View  Result Date: 06/03/2019 CLINICAL DATA:  Congestive heart failure. EXAM: CHEST - 2 VIEW COMPARISON:  06/01/2019 FINDINGS: Stable top-normal heart size. Diminishment in pulmonary edema since the prior study with venous hypertensive changes remaining. Small bilateral pleural effusions remain, left greater than right. No focal airspace disease. IMPRESSION: Diminishment in pulmonary edema since the prior study. Small bilateral pleural effusions remain, left greater than right. Electronically Signed   By: Aletta Edouard M.D.   On: 06/03/2019 07:55   US Venous Img Lower Unilateral Right  Result Date: 06/02/2019 CLINICAL DATA:  Swelling EXAM: RIGHT LOWER EXTREMITY VENOUS DOPPLER ULTRASOUND TECHNIQUE: Gray-scale sonography  with compression, as well as color and duplex ultrasound, were performed to evaluate the deep venous system from the level of the common femoral vein through the popliteal and proximal calf veins. COMPARISON:  04/30/2014 FINDINGS: Normal compressibility of the common femoral, superficial femoral, and popliteal veins, as well as the proximal calf veins. No filling defects to suggest DVT on grayscale or color Doppler imaging. Doppler waveforms show normal direction of venous flow, normal respiratory phasicity and response to augmentation. Survey views of the contralateral common femoral vein are unremarkable.  IMPRESSION: No femoropopliteal and no calf DVT in the visualized calf veins. If clinical symptoms are inconsistent or if there are persistent or worsening symptoms, further imaging (possibly involving the iliac veins) may be warranted. Electronically Signed   By: Lucrezia Europe M.D.   On: 06/02/2019 08:12   Dg Chest Portable 1 View  Result Date: 06/01/2019 CLINICAL DATA:  Shortness of breath beginning yesterday. EXAM: PORTABLE CHEST 1 VIEW COMPARISON:  08/17/2016 FINDINGS: Cardiomegaly. Aortic atherosclerosis. Pulmonary venous hypertension with mild interstitial edema. Bilateral effusions with dependent atelectasis. IMPRESSION: Congestive heart failure. Cardiomegaly. Aortic atherosclerosis. Pulmonary venous hypertension and mild interstitial edema. Bilateral effusions and lower lung atelectasis. Electronically Signed   By: Nelson Chimes M.D.   On: 06/01/2019 11:02      Assessment and Plan: Patient Active Problem List   Diagnosis Date Noted  . C. difficile diarrhea 11/24/2018  . Diarrhea 11/18/2018  . Nausea without vomiting 11/18/2018  . Goals of care, counseling/discussion 09/12/2018  . 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 History of repair of right rotator cuff 08/25/2016  . Acute respiratory failure with hypoxia ([place]) 08/06/2016  . Nontraumatic tear of right rotator cuff 04/21/2016  . Atypical chest pain 04/07/2016  . Impingement syndrome of right shoulder 12/29/2015  . Primary osteoarthritis of first carpometacarpal joint of left hand 10/27/2015  . Right shoulder pain 10/27/2015  . OSA on CPAP 10/14/2015  . COPD with asthma ([place]) 07/13/2015  . Type 2 diabetes mellitus with microalbuminuria, without long-term current use of insulin ([place]) 07/13/2015  . Essential hypertension 03/05/2015  . Difficulty sleeping 11/26/2014  . CRI (chronic renal insufficiency) 07/31/2014  . Seizure disorder ([place]) 07/31/2014  . History of colonic polyps 05/21/2014  . Hyperlipidemia, unspecified  04/30/2014    1. OSA on CPAP Stable, continue present management.  Due to increased CO2 levels home CPAP/BiPAP has been rendered ineffective.  Patient needs a noninvasive ventilator.  2. Chronic obstructive pulmonary disease, unspecified COPD type ([place]) Stable, continue present mangement.   3. Nocturnal hypoxia Continue to use oxygen at night as directed, with cpap.   4. Seasonal allergic rhinitis due to pollen Stable, continue present management.   5. Other congestive heart failure ([place]) Treated and currently stable, improving.  6. Chronic respiratory failure with hypoxia ([facility]) Oxygen level was 83% initially on room air today when checked.  It came up to 93% on 2.5 LPM of oxygen. PT will need NIV replaced from [facility], through [facility] at her request.   CPAP AND HOME BIPAP IS NOT A OPTION DUE TO MACHINES LIMITATIONS. USE FOR NIV , THE USE OF NIV WILL TREAT THE PATIENTS HIGH PCO2 LEVELS AND CAN REDUCE THE RISK OF EXACERBATIONS AND FUTURE HOSPITALIZATIONS WHEN USED AT NIGHT AND DURING THE DAY.  General Counseling: I have discussed the findings of the evaluation and examination with Arbie Cookey.  I have also discussed any further diagnostic evaluation thatmay be needed or ordered today. Emmeline verbalizes understanding of  the findings of todays visit. We also reviewed her medications today and discussed drug interactions and side effects including but not limited excessive drowsiness and altered mental states. We also discussed that there is always a risk not just to her but also people around her. she has been encouraged to call the office with any questions or concerns that should arise related to todays visit.    Time spent: 25  I have personally obtained a history, examined the patient, evaluated laboratory and imaging results, formulated the assessment and plan and placed orders.    Allyne Gee, MD [facility] Pulmonary and Critical Care Sleep medicine

## 2019-06-25 ENCOUNTER — Telehealth: Payer: Self-pay

## 2019-06-25 NOTE — Telephone Encounter (Signed)
Faxed [facility] orders to pick up equipment asap, she was already established with [facility] and once d.c. from [facility] she was given supplies with [facility], I sent order to pickup them up.  Gave [facility] orders rx for a new NIV machine. beth

## 2019-07-08 DIAGNOSIS — I4819 Other persistent atrial fibrillation: Secondary | ICD-10-CM | POA: Insufficient documentation

## 2019-07-08 DIAGNOSIS — I340 Nonrheumatic mitral (valve) insufficiency: Secondary | ICD-10-CM | POA: Insufficient documentation

## 2019-07-09 ENCOUNTER — Telehealth: Payer: Self-pay

## 2019-07-09 DIAGNOSIS — I509 Heart failure, unspecified: Secondary | ICD-10-CM | POA: Insufficient documentation

## 2019-07-09 NOTE — Telephone Encounter (Signed)
PLAN OF CARE SIGNED AND PLACED IN [facility] FOLDER.

## 2019-08-01 ENCOUNTER — Ambulatory Visit: Payer: Medicare Other | Admitting: Internal Medicine

## 2019-08-06 NOTE — Progress Notes (Signed)
Patient is coming in for follow up she is doing well.  

## 2019-08-06 NOTE — Progress Notes (Signed)
[facility]  [address], [address] [place], [place] [address] Phone: [phone]  Fax: [phone]   Clinic Day:  08/08/2019  Referring physician: Sofie Hartigan, MD  Chief Complaint: DENAIJA MARALDO is a 78 y.o. female with polycythemia rubra vera ([place])  who is seen for 3 month assessment on hydroxyurea.  HPI: The patient was last seen in the hematology clinic on 05/09/2019. At that time, she was doing well. She denied any B symptoms. Exam revealed no adenopathy or hepatosplenomegaly. Hematocrit 39.7 (goal </= 42). Hemoglobin 13.8. Platelet counts were 353,000 (goal <400,000). She continued on hydroxyurea 500 mg a day.   She was admitted to [facility] from 06/01/2019 - 06/03/2019 with acute on chronic respiratory failure with hypoxia secondary to acute on chronic diastolic congestive heart failure and COPD exacerbation. She was given IV diuresis, and Solu-Medrol. Symptoms improved after diuresis. She was given DuoNeb as needed. She was started on Eliquis.  Echo on 06/02/2019 showed an EF of 60-65%.  CXR on 06/01/2019 revealed congestive heart failure. Cardiomegaly. Pulmonary venous hypertension and mild interstitial edema. Bilateral effusions and lower lung atelectasis.   Right lower extremity venous doppler ultrasound on 06/02/2019 revealed no femoropopliteal and no calf DVT in the visualized calf veins.   CXR on 06/03/2019 showed a diminishment in pulmonary edema since the prior study. Small bilateral pleural effusions remained, left greater than right.   She was seen in pulmonology by Mirna Mires on 06/19/2019 for a hospital follow up. Her oxygen level was 83% and increased to 93% after 2.5 LPM of oxygen during the visit. [place], NP noted the CPAP and home BIPAP was not an option due to the machines limitations. Patient needed a non invasive ventilation (NIV) for her high PCO2 levels and to reduce risk of exacerbation.   Labs on 05/23/2019: Hematocrit 40.2,  hemoglobin 13.6, platelets 296,000, WBC 13,800 (ANC 11,400). Hepatic function panel was normal.    During the interim, she has done well. Her potassium is 3.0 in the clinic today. Patient reports eating baked potatoes and other potassium rich foods. Sh has orthopnea, and shortness of breath. She has difficulty sleeping at night, and she sleeps on her side. She denies fevers, sweats, and infections.  She has been taking hydroxyurea 500 mg daily. She is on Eliquis 5 mg BID and Lasix 20 mg BID. She has an appointment at [facility] on 08/13/2019 with Dr. Bertell Maria for a heart valve problem.    Past Medical History:  Diagnosis Date  . Allergy    Seasonal  . Arthritis   . 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 Chronic kidney disease    chronic renal insufficiency  . Colon polyps 05/21/14  . COPD (chronic obstructive pulmonary disease) ([place])   . Diabetes mellitus without complication ([place])   . Dyspnea   . Fracture closed, humerus, shaft    right   . History of kidney stones    40 years ago  . Hyperlipidemia 04/30/14  . Hypertension   . Impingement syndrome of right shoulder 12/29/15  . Personal history of radiation therapy   . Pneumonia    hx  . Rotator cuff tear 04/21/16   right  . Seizures ([place])   . Sleep apnea     Past Surgical History:  Procedure Laterality Date  . ABDOMINAL HYSTERECTOMY    . 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 EYE SURGERY     bilateral cataracts  .  JOINT REPLACEMENT     bilateral knee replacement  . NASAL SEPTUM SURGERY    . OOPHORECTOMY    . REPLACEMENT TOTAL KNEE Bilateral 2004  . SHOULDER ARTHROSCOPY WITH BICEPSTENOTOMY Right 05/25/2016   Procedure: SHOULDER ARTHROSCOPY WITH BICEPSTENOTOMY;  Surgeon: Leanor Kail, MD;  Location: [facility] ORS;  Service: Orthopedics;  Laterality: Right;  . SHOULDER ARTHROSCOPY WITH DISTAL CLAVICLE RESECTION Right 05/25/2016   Procedure: SHOULDER ARTHROSCOPY WITH DISTAL CLAVICLE RESECTION;  Surgeon: Leanor Kail, MD;  Location: [facility] ORS;  Service: Orthopedics;  Laterality: Right;  . SHOULDER ARTHROSCOPY WITH OPEN ROTATOR CUFF REPAIR Right 05/25/2016   Procedure: SHOULDER ARTHROSCOPY WITH OPEN ROTATOR CUFF REPAIR;  Surgeon: Leanor Kail, MD;  Location: [facility] ORS;  Service: Orthopedics;  Laterality: Right;  . SUBACROMIAL DECOMPRESSION Right 05/25/2016   Procedure: SUBACROMIAL DECOMPRESSION;  Surgeon: Leanor Kail, MD;  Location: [facility] ORS;  Service: Orthopedics;  Laterality: Right;    Family History  Problem Relation Age of Onset  . Breast cancer Paternal Aunt 51  . Diabetes Father   . Heart disease Father     Social History:  reports that she quit smoking about 14 years ago. She has a 40.00 pack-year smoking history. She has never used smokeless tobacco. She reports that she does not drink alcohol or use drugs. Patient moved to [place] in 2001 from [place]. Patient is a former 1 ppd smoker x 40 years; quit in 2006. She is retired from [employer]. Patient denies known exposures to radiation on toxins. Her significant other is [employer].  She lives in [place].  She is alone today.   Allergies:  Allergies  Allergen Reactions  . Iodinated Diagnostic Agents Anaphylaxis    Other reaction(s): Other (See Comments) Throat swells and extreme hives  . Latex Itching  . Phenobarbital Hives  . Tape Rash    silicones    Current Medications: Current Outpatient Medications  Medication Sig Dispense Refill  . albuterol (ACCUNEB) 1.25 MG/3ML nebulizer solution Take 3 mLs (1.25 mg total) by nebulization every 6 (six) hours as needed. wheezing 75 mL 3  . apixaban (ELIQUIS) 5 MG TABS tablet Take 1 tablet (5 mg total) by mouth 2 (two) times daily. 60 tablet 0  . aspirin 81 MG tablet Take 81 mg by mouth daily.    Marland Kitchen atorvastatin (LIPITOR) 40 MG tablet Take 40 mg by mouth daily.    . B Complex Vitamins (VITAMIN B COMPLEX PO) Take 1 tablet by mouth daily.    . Calcium Carbonate-Vitamin D 600-200 MG-UNIT CAPS  Take 1 capsule by mouth daily.    . cloNIDine (CATAPRES) 0.2 MG tablet Take 0.2 mg by mouth 2 (two) times daily.    . diphenhydrAMINE (BENADRYL) 25 mg capsule Take 25 mg by mouth daily.    Marland Kitchen EPINEPHrine 0.3 mg/0.3 mL IJ SOAJ injection Inject 0.3 mg into the muscle as needed for anaphylaxis.     Marland Kitchen Fluticasone-Umeclidin-Vilant (TRELEGY ELLIPTA) 100-62.5-25 MCG/INH AEPB Inhale 1 puff into the lungs daily. 1 each 4  . furosemide (LASIX) 20 MG tablet Take 1 tablet (20 mg total) by mouth 2 (two) times daily. 60 tablet 0  . hydrALAZINE (APRESOLINE) 100 MG tablet Take 50 mg by mouth 3 (three) times daily.     . hydroxyurea (HYDREA) 500 MG capsule Take 1 capsule (500 mg total) by mouth daily. May take with food to minimize GI side effects. 90 capsule 0  . levETIRAcetam (KEPPRA) 750 MG tablet Take 750 mg by mouth 2 (two) times daily.    Marland Kitchen  metFORMIN (GLUCOPHAGE) 500 MG tablet Take 500 mg by mouth 2 (two) times daily with a meal.    . metoprolol succinate (TOPROL-XL) 100 MG 24 hr tablet Take 100 mg by mouth daily.    . montelukast (SINGULAIR) 10 MG tablet TAKE 1 TABLET BY MOUTH EVERY DAY (Patient taking differently: Take 10 mg by mouth daily. ) 90 tablet 0  . Multiple Vitamin (MULTI-VITAMINS) TABS Take 1 tablet by mouth daily.    Marland Kitchen NIFEdipine (NIFEDICAL XL) 30 MG 24 hr tablet Take 30 mg by mouth daily.    Marland Kitchen OVER THE COUNTER MEDICATION Place 1 drop into both eyes daily. Allergy eye drops    . OXYGEN Inhale 2 L into the lungs.    . sitaGLIPtin (JANUVIA) 25 MG tablet Take 1 tablet by mouth daily.    . traZODone (DESYREL) 50 MG tablet Take 50-100 mg by mouth at bedtime as needed for sleep.   0  . valsartan-hydrochlorothiazide (DIOVAN-HCT) 320-25 MG tablet Take 1 tablet by mouth daily.  0  . VENTOLIN HFA 108 (90 Base) MCG/ACT inhaler INHALE 2 PUFFS INTO THE LUNGS EVERY 6 HOURS AS NEEDED (Patient taking differently: Inhale 2 puffs into the lungs every 6 (six) hours as needed for wheezing or shortness of breath. )  54 g 1  . vitamin C (ASCORBIC ACID) 500 MG tablet Take 500 mg by mouth 2 (two) times daily.    . fluticasone (FLONASE) 50 MCG/ACT nasal spray Place 2 sprays into both nostrils daily. (Patient not taking: Reported on 08/08/2019) 16 g 2   No current facility-administered medications for this visit.     Review of Systems  Constitutional: Positive for weight loss (4 lbs since 04/2019). Negative for chills, diaphoresis, fever and malaise/fatigue.       Doing well.  HENT: Negative.  Negative for congestion, hearing loss, nosebleeds, sinus pain and sore throat.   Eyes: Negative.  Negative for blurred vision, double vision and photophobia.       Macular degeneration left eye, receives injection.   Respiratory: Positive for shortness of breath. Negative for cough, hemoptysis and sputum production.        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 COPD.On 2 liters of oxygen.  Cardiovascular: Positive for orthopnea (chronic). Negative for chest pain, palpitations, leg swelling and PND.       Notes "valve issues" and f/u at [facility].  Gastrointestinal: Negative.  Negative for abdominal pain, blood in stool, constipation, diarrhea, melena, nausea and vomiting.  Genitourinary: Negative.  Negative for dysuria, frequency, hematuria and urgency.  Musculoskeletal: Negative for back pain (DDD), falls, joint pain (OA in shoulders) and neck pain.  Skin: Negative.  Negative for itching and rash.  Neurological: Negative.  Negative for dizziness, tremors, sensory change, speech change, weakness and headaches.  Endo/Heme/Allergies: Positive for environmental allergies (season; recieves allergy injections every 4 weeks). Does not bruise/bleed easily.  Psychiatric/Behavioral: Negative for depression and memory loss. The patient has insomnia (difficulty sleeping). The patient is not nervous/anxious.   All other systems reviewed and are negative.  Performance status (ECOG):  1  Vitals Blood pressure (!) 145/62, pulse  73, temperature 98.5 F (36.9 C), temperature source Tympanic, resp. rate 18, height 5\' 7"  (1.702 m), weight 221 lb 9 oz (100.5 kg), SpO2 90 %.  Physical Exam  Constitutional: She is oriented to person, place, and time. She appears well-developed and well-nourished. No distress.  HENT:  Head: Normocephalic and atraumatic.  Mouth/Throat: Oropharynx is clear and moist. No oropharyngeal exudate.  Short styled gray hair.  Mask.  Concow in place with portable oxygen tank.  Eyes: Pupils are equal, round, and reactive to light. Conjunctivae and EOM are normal. No scleral icterus.  Glasses.  Brown eyes.  Neck: Normal range of motion. Neck supple.  Cardiovascular: Normal rate, regular rhythm and normal heart sounds.  No murmur heard. Pulmonary/Chest: Effort normal and breath sounds normal. No respiratory distress. She has no wheezes. She has no rales.  Abdominal: Soft. Bowel sounds are normal. She exhibits no distension. There is no abdominal tenderness.  Musculoskeletal: Normal range of motion.        General: Edema (ankles) present.  Lymphadenopathy:    She has no cervical adenopathy.    She has no axillary adenopathy.       Right: No supraclavicular adenopathy present.       Left: No supraclavicular adenopathy present.  Neurological: She is alert and oriented to person, place, and time.  Skin: Skin is warm and dry. She is not diaphoretic. No erythema.  Psychiatric: She has a normal mood and affect. Her behavior is normal. Judgment and thought content normal.  Nursing note and vitals reviewed.   Appointment on 08/08/2019  Component Date Value Ref Range Status  . Sodium 08/08/2019 140  135 - 145 mmol/L Final  . Potassium 08/08/2019 3.0* 3.5 - 5.1 mmol/L Final  . Chloride 08/08/2019 104  98 - 111 mmol/L Final  . CO2 08/08/2019 27  22 - 32 mmol/L Final  . Glucose, Bld 08/08/2019 149* 70 - 99 mg/dL Final  . BUN 08/08/2019 15  8 - 23 mg/dL Final  . Creatinine, Ser 08/08/2019 1.03* 0.44 - 1.00  mg/dL Final  . Calcium 08/08/2019 9.3  8.9 - 10.3 mg/dL Final  . Total Protein 08/08/2019 6.6  6.5 - 8.1 g/dL Final  . Albumin 08/08/2019 4.1  3.5 - 5.0 g/dL Final  . AST 08/08/2019 28  15 - 41 U/L Final  . ALT 08/08/2019 24  0 - 44 U/L Final  . Alkaline Phosphatase 08/08/2019 72  38 - 126 U/L Final  . Total Bilirubin 08/08/2019 0.9  0.3 - 1.2 mg/dL Final  . GFR calc non Af Amer 08/08/2019 53* >60 mL/min Final  . GFR calc Af Amer 08/08/2019 >60  >60 mL/min Final  . Anion gap 08/08/2019 9  5 - 15 Final   Performed at [facility], [address] [place], [place] [address]  . WBC 08/08/2019 11.6* 4.0 - 10.5 K/uL Final  . RBC 08/08/2019 4.08  3.87 - 5.11 MIL/uL Final  . Hemoglobin 08/08/2019 13.6  12.0 - 15.0 g/dL Final  . HCT 08/08/2019 38.8  36.0 - 46.0 % Final  . MCV 08/08/2019 95.1  80.0 - 100.0 fL Final  . MCH 08/08/2019 33.3  26.0 - 34.0 pg Final  . MCHC 08/08/2019 35.1  30.0 - 36.0 g/dL Final  . RDW 08/08/2019 13.3  11.5 - 15.5 % Final  . Platelets 08/08/2019 272  150 - 400 K/uL Final  . nRBC 08/08/2019 0.0  0.0 - 0.2 % Final  . Neutrophils Relative % 08/08/2019 80  % Final  . Neutro Abs 08/08/2019 9.4* 1.7 - 7.7 K/uL Final  . Lymphocytes Relative 08/08/2019 6  % Final  . Lymphs Abs 08/08/2019 0.7  0.7 - 4.0 K/uL Final  . Monocytes Relative 08/08/2019 9  % Final  . Monocytes Absolute 08/08/2019 1.0  0.1 - 1.0 K/uL Final  . Eosinophils Relative 08/08/2019 3  % Final  .  Eosinophils Absolute 08/08/2019 0.3  0.0 - 0.5 K/uL Final  . Basophils Relative 08/08/2019 1  % Final  . Basophils Absolute 08/08/2019 0.1  0.0 - 0.1 K/uL Final  . Immature Granulocytes 08/08/2019 1  % Final  . Abs Immature Granulocytes 08/08/2019 0.09* 0.00 - 0.07 K/uL Final   Performed at [facility], [address], [place], [place] [address]    Assessment:  ALLINSON PORTELLI is a 78 y.o. female withpolycythemia rubra vera.She has had sleep apnea x 7 years and uses CPAP. She has a 40  pack year smoking history (stopped 13 years ago). She denies and cardiac history.  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 There was no significant dysplasia or increased blasts. Overall features were c/w polycythemia rubra vera.  Work up on 08/08/2018 revealed a WBC of 16,700 ([place] 13,800). Hemoglobin 15.3, hematocrit 45.5, MCV 87.9, and platelets 552,000. Erythropoietin level was normal at 2.9 mIU/mL. BCR/ABL demonstrated no BCR or ABL gene rearrangements. Carbon monoxide level was normal at 3.0% (0-3.6 %). 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   She began a phlebotomy programon 09/07/2018 (last 03/14/2019). Hematocrit goalis <=42.  She began hydroxyurea500 mg a day on 09/12/2018.  She was diagnosed with C difficile + diarrheaon 10/10/2018; completed 10 day course of oral vancomycin. 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  Symptomatically, she is doing well.  She has underlying cardiac issues.  Exam reveals no adenopathy or hepatosplenomegaly.  Potassium is 3.0.  Plan: 1.   Labs today: CBC with diff, CMP. 2.   Polycythemia rubra vera Hematocrit 38.8. Hemoglobin 13.6.  Platelets 272,000 Hematocrit goal </= 42.  Platelet goal < 400,000. No phlebotomy needed today. Continue hydroxyurea 500 mg a day. 3.   Hypokalemia             Potassium 3.0.  Etiology secondary to Lasix.               Contact Dr Ubaldo Glassing or Dr Ellison Hughs- done. 4.   RTC in 3 months for MD assessment and labs (CBC with diff, CMP).  I discussed the assessment and treatment plan with the patient.  The patient was provided an opportunity to ask questions and all were answered.  The patient agreed with the plan and demonstrated an understanding of the instructions.  The patient was advised to call back if the symptoms worsen or if the condition fails to improve as anticipated.  I  provided 16 minutes of face-to-face time during this this encounter and > 50% was spent counseling as documented under my assessment and plan.    Lequita Asal, MD, PhD    08/08/2019, 2:44 PM  I, Selena Batten, am acting as scribe for [employer]. Mike Gip, MD, PhD.  I, Melissa C. Mike Gip, MD, have reviewed the above documentation for accuracy and completeness, and I agree with the above.

## 2019-08-08 ENCOUNTER — Encounter: Payer: Self-pay | Admitting: Hematology and Oncology

## 2019-08-08 ENCOUNTER — Telehealth: Payer: Self-pay

## 2019-08-08 ENCOUNTER — Inpatient Hospital Stay: Payer: Medicare Other

## 2019-08-08 ENCOUNTER — Encounter: Payer: Self-pay | Admitting: Internal Medicine

## 2019-08-08 ENCOUNTER — Ambulatory Visit (INDEPENDENT_AMBULATORY_CARE_PROVIDER_SITE_OTHER): Payer: Medicare Other | Admitting: Internal Medicine

## 2019-08-08 ENCOUNTER — Inpatient Hospital Stay: Payer: Medicare Other | Attending: Hematology and Oncology | Admitting: Hematology and Oncology

## 2019-08-08 ENCOUNTER — Other Ambulatory Visit: Payer: Self-pay

## 2019-08-08 VITALS — BP 126/70 | Resp 16 | Ht 67.0 in | Wt 222.0 lb

## 2019-08-08 VITALS — BP 145/62 | HR 73 | Temp 98.5°F | Resp 18 | Ht 67.0 in | Wt 221.6 lb

## 2019-08-08 DIAGNOSIS — D45 Polycythemia vera: Secondary | ICD-10-CM | POA: Diagnosis not present

## 2019-08-08 DIAGNOSIS — Z888 Allergy status to other drugs, medicaments and biological substances status: Secondary | ICD-10-CM | POA: Diagnosis not present

## 2019-08-08 DIAGNOSIS — J441 Chronic obstructive pulmonary disease with (acute) exacerbation: Secondary | ICD-10-CM | POA: Insufficient documentation

## 2019-08-08 DIAGNOSIS — Z7901 Long term (current) use of anticoagulants: Secondary | ICD-10-CM | POA: Diagnosis not present

## 2019-08-08 DIAGNOSIS — R609 Edema, unspecified: Secondary | ICD-10-CM | POA: Diagnosis not present

## 2019-08-08 DIAGNOSIS — Z87442 Personal history of urinary calculi: Secondary | ICD-10-CM | POA: Insufficient documentation

## 2019-08-08 DIAGNOSIS — E119 Type 2 diabetes mellitus without complications: Secondary | ICD-10-CM | POA: Diagnosis not present

## 2019-08-08 DIAGNOSIS — J301 Allergic rhinitis due to pollen: Secondary | ICD-10-CM

## 2019-08-08 DIAGNOSIS — Z803 Family history of malignant neoplasm of breast: Secondary | ICD-10-CM | POA: Diagnosis not present

## 2019-08-08 DIAGNOSIS — G4730 Sleep apnea, unspecified: Secondary | ICD-10-CM | POA: Diagnosis not present

## 2019-08-08 DIAGNOSIS — Z79899 Other long term (current) drug therapy: Secondary | ICD-10-CM | POA: Insufficient documentation

## 2019-08-08 DIAGNOSIS — E876 Hypokalemia: Secondary | ICD-10-CM | POA: Diagnosis not present

## 2019-08-08 DIAGNOSIS — J449 Chronic obstructive pulmonary disease, unspecified: Secondary | ICD-10-CM

## 2019-08-08 DIAGNOSIS — E785 Hyperlipidemia, unspecified: Secondary | ICD-10-CM | POA: Insufficient documentation

## 2019-08-08 DIAGNOSIS — I130 Hypertensive heart and chronic kidney disease with heart failure and stage 1 through stage 4 chronic kidney disease, or unspecified chronic kidney disease: Secondary | ICD-10-CM | POA: Diagnosis not present

## 2019-08-08 DIAGNOSIS — Z7984 Long term (current) use of oral hypoglycemic drugs: Secondary | ICD-10-CM | POA: Diagnosis not present

## 2019-08-08 DIAGNOSIS — J9611 Chronic respiratory failure with hypoxia: Secondary | ICD-10-CM

## 2019-08-08 DIAGNOSIS — F449 Dissociative and conversion disorder, unspecified: Secondary | ICD-10-CM | POA: Insufficient documentation

## 2019-08-08 DIAGNOSIS — I5032 Chronic diastolic (congestive) heart failure: Secondary | ICD-10-CM | POA: Diagnosis not present

## 2019-08-08 DIAGNOSIS — J90 Pleural effusion, not elsewhere classified: Secondary | ICD-10-CM | POA: Diagnosis not present

## 2019-08-08 DIAGNOSIS — Z7982 Long term (current) use of aspirin: Secondary | ICD-10-CM | POA: Insufficient documentation

## 2019-08-08 DIAGNOSIS — R0602 Shortness of breath: Secondary | ICD-10-CM | POA: Diagnosis not present

## 2019-08-08 DIAGNOSIS — G4733 Obstructive sleep apnea (adult) (pediatric): Secondary | ICD-10-CM

## 2019-08-08 DIAGNOSIS — Z87891 Personal history of nicotine dependence: Secondary | ICD-10-CM | POA: Insufficient documentation

## 2019-08-08 DIAGNOSIS — Z9989 Dependence on other enabling machines and devices: Secondary | ICD-10-CM

## 2019-08-08 DIAGNOSIS — Z8719 Personal history of other diseases of the digestive system: Secondary | ICD-10-CM | POA: Insufficient documentation

## 2019-08-08 LAB — CBC WITH DIFFERENTIAL/PLATELET
Abs Immature Granulocytes: 0.09 10*3/uL — ABNORMAL HIGH (ref 0.00–0.07)
Basophils Absolute: 0.1 10*3/uL (ref 0.0–0.1)
Basophils Relative: 1 %
Eosinophils Absolute: 0.3 10*3/uL (ref 0.0–0.5)
Eosinophils Relative: 3 %
HCT: 38.8 % (ref 36.0–46.0)
Hemoglobin: 13.6 g/dL (ref 12.0–15.0)
Immature Granulocytes: 1 %
Lymphocytes Relative: 6 %
Lymphs Abs: 0.7 10*3/uL (ref 0.7–4.0)
MCH: 33.3 pg (ref 26.0–34.0)
MCHC: 35.1 g/dL (ref 30.0–36.0)
MCV: 95.1 fL (ref 80.0–100.0)
Monocytes Absolute: 1 10*3/uL (ref 0.1–1.0)
Monocytes Relative: 9 %
Neutro Abs: 9.4 10*3/uL — ABNORMAL HIGH (ref 1.7–7.7)
Neutrophils Relative %: 80 %
Platelets: 272 10*3/uL (ref 150–400)
RBC: 4.08 MIL/uL (ref 3.87–5.11)
RDW: 13.3 % (ref 11.5–15.5)
WBC: 11.6 10*3/uL — ABNORMAL HIGH (ref 4.0–10.5)
nRBC: 0 % (ref 0.0–0.2)

## 2019-08-08 LAB — COMPREHENSIVE METABOLIC PANEL
ALT: 24 U/L (ref 0–44)
AST: 28 U/L (ref 15–41)
Albumin: 4.1 g/dL (ref 3.5–5.0)
Alkaline Phosphatase: 72 U/L (ref 38–126)
Anion gap: 9 (ref 5–15)
BUN: 15 mg/dL (ref 8–23)
CO2: 27 mmol/L (ref 22–32)
Calcium: 9.3 mg/dL (ref 8.9–10.3)
Chloride: 104 mmol/L (ref 98–111)
Creatinine, Ser: 1.03 mg/dL — ABNORMAL HIGH (ref 0.44–1.00)
GFR calc Af Amer: >60 mL/min (ref >60)
GFR calc non Af Amer: 53 mL/min — ABNORMAL LOW (ref >60)
Glucose, Bld: 149 mg/dL — ABNORMAL HIGH (ref 70–99)
Potassium: 3 mmol/L — ABNORMAL LOW (ref 3.5–5.1)
Sodium: 140 mmol/L (ref 135–145)
Total Bilirubin: 0.9 mg/dL (ref 0.3–1.2)
Total Protein: 6.6 g/dL (ref 6.5–8.1)

## 2019-08-08 MED ORDER — FLUTICASONE PROPIONATE 50 MCG/ACT NA SUSP: 2.0000 | Freq: Every day | NASAL | 2 refills | Status: AC

## 2019-08-08 NOTE — Progress Notes (Signed)
[facility] [place], [place] [address]  Pulmonary Sleep Medicine   Office Visit Note  Patient Name: Angelica Juarez DOB: 1941-06-03 MRN [id number]  Date of Service: 08/08/2019  Complaints/HPI: Pt is here for follow up.  She is on oxygen 2 lpm continuously.  She is constantly having the have the company change her tanks out.  She is interested in a Marine scientist.  She did a 6 min walk today.  Her o2 saturation dropped to 83% with one lap, and when she was back on oxygen it rebounded to 95%.    ROS  General: (-) fever, (-) chills, (-) night sweats, (-) weakness Skin: (-) rashes, (-) itching,. Eyes: (-) visual changes, (-) redness, (-) itching. Nose and Sinuses: (-) nasal stuffiness or itchiness, (-) postnasal drip, (-) nosebleeds, (-) sinus trouble. Mouth and Throat: (-) sore throat, (-) hoarseness. Neck: (-) swollen glands, (-) enlarged thyroid, (-) neck pain. Respiratory: - cough, (-) bloody sputum, + shortness of breath, - wheezing. Cardiovascular: - ankle swelling, (-) chest pain. Lymphatic: (-) lymph node enlargement. Neurologic: (-) numbness, (-) tingling. Psychiatric: (-) anxiety, (-) depression   Current Medication: Outpatient Encounter Medications as of 08/08/2019  Medication Sig  . albuterol (ACCUNEB) 1.25 MG/3ML nebulizer solution Take 3 mLs (1.25 mg total) by nebulization every 6 (six) hours as needed. wheezing  . apixaban (ELIQUIS) 5 MG TABS tablet Take 1 tablet (5 mg total) by mouth 2 (two) times daily.  Marland Kitchen aspirin 81 MG tablet Take 81 mg by mouth daily.  Marland Kitchen atorvastatin (LIPITOR) 40 MG tablet Take 40 mg by mouth daily.  . B Complex Vitamins (VITAMIN B COMPLEX PO) Take 1 tablet by mouth daily.  . Calcium Carbonate-Vitamin D 600-200 MG-UNIT CAPS Take 1 capsule by mouth daily.  . cloNIDine (CATAPRES) 0.2 MG tablet Take 0.2 mg by mouth 2 (two) times daily.  . diphenhydrAMINE (BENADRYL) 25 mg capsule Take 25 mg by mouth daily.  Marland Kitchen EPINEPHrine  0.3 mg/0.3 mL IJ SOAJ injection Inject 0.3 mg into the muscle as needed for anaphylaxis.   . fluticasone (FLONASE) 50 MCG/ACT nasal spray Place 2 sprays into both nostrils daily.  . Fluticasone-Umeclidin-Vilant (TRELEGY ELLIPTA) 100-62.5-25 MCG/INH AEPB Inhale 1 puff into the lungs daily.  . furosemide (LASIX) 20 MG tablet Take 1 tablet (20 mg total) by mouth 2 (two) times daily.  . hydrALAZINE (APRESOLINE) 100 MG tablet Take 50 mg by mouth 3 (three) times daily.   . hydroxyurea (HYDREA) 500 MG capsule Take 1 capsule (500 mg total) by mouth daily. May take with food to minimize GI side effects.  . levETIRAcetam (KEPPRA) 750 MG tablet Take 750 mg by mouth 2 (two) times daily.  . metFORMIN (GLUCOPHAGE) 500 MG tablet Take 500 mg by mouth 2 (two) times daily with a meal.  . metoprolol succinate (TOPROL-XL) 100 MG 24 hr tablet Take 100 mg by mouth daily.  . montelukast (SINGULAIR) 10 MG tablet TAKE 1 TABLET BY MOUTH EVERY DAY (Patient taking differently: Take 10 mg by mouth daily. )  . Multiple Vitamin (MULTI-VITAMINS) TABS Take 1 tablet by mouth daily.  Marland Kitchen NIFEdipine (NIFEDICAL XL) 30 MG 24 hr tablet Take 30 mg by mouth daily.  Marland Kitchen OVER THE COUNTER MEDICATION Place 1 drop into both eyes daily. Allergy eye drops  . OXYGEN Inhale 2 L into the lungs.  . sitaGLIPtin (JANUVIA) 25 MG tablet Take 1 tablet by mouth daily.  . traZODone (DESYREL) 50 MG tablet Take 50-100 mg by mouth  at bedtime as needed for sleep.   . valsartan-hydrochlorothiazide (DIOVAN-HCT) 320-25 MG tablet Take 1 tablet by mouth daily.  . VENTOLIN HFA 108 (90 Base) MCG/ACT inhaler INHALE 2 PUFFS INTO THE LUNGS EVERY 6 HOURS AS NEEDED (Patient taking differently: Inhale 2 puffs into the lungs every 6 (six) hours as needed for wheezing or shortness of breath. )  . vitamin C (ASCORBIC ACID) 500 MG tablet Take 500 mg by mouth 2 (two) times daily.   No facility-administered encounter medications on file as of 08/08/2019.     Surgical  History: Past Surgical History:  Procedure Laterality Date  . ABDOMINAL HYSTERECTOMY    . 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 EYE SURGERY     bilateral cataracts  . JOINT REPLACEMENT     bilateral knee replacement  . NASAL SEPTUM SURGERY    . OOPHORECTOMY    . REPLACEMENT TOTAL KNEE Bilateral 2004  . SHOULDER ARTHROSCOPY WITH BICEPSTENOTOMY Right 05/25/2016   Procedure: SHOULDER ARTHROSCOPY WITH BICEPSTENOTOMY;  Surgeon: Leanor Kail, MD;  Location: [facility] ORS;  Service: Orthopedics;  Laterality: Right;  . SHOULDER ARTHROSCOPY WITH DISTAL CLAVICLE RESECTION Right 05/25/2016   Procedure: SHOULDER ARTHROSCOPY WITH DISTAL CLAVICLE RESECTION;  Surgeon: Leanor Kail, MD;  Location: [facility] ORS;  Service: Orthopedics;  Laterality: Right;  . SHOULDER ARTHROSCOPY WITH OPEN ROTATOR CUFF REPAIR Right 05/25/2016   Procedure: SHOULDER ARTHROSCOPY WITH OPEN ROTATOR CUFF REPAIR;  Surgeon: Leanor Kail, MD;  Location: [facility] ORS;  Service: Orthopedics;  Laterality: Right;  . SUBACROMIAL DECOMPRESSION Right 05/25/2016   Procedure: SUBACROMIAL DECOMPRESSION;  Surgeon: Leanor Kail, MD;  Location: [facility] ORS;  Service: Orthopedics;  Laterality: Right;    Medical History: Past Medical History:  Diagnosis Date  . Allergy    Seasonal  . Arthritis   . 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 Chronic kidney disease    chronic renal insufficiency  . Colon polyps 05/21/14  . COPD (chronic obstructive pulmonary disease) ([place])   . Diabetes mellitus without complication ([place])   . Dyspnea   . Fracture closed, humerus, shaft    right   . History of kidney stones    40 years ago  . Hyperlipidemia 04/30/14  . Hypertension   . Impingement syndrome of right shoulder 12/29/15  . Personal history of radiation therapy   . Pneumonia    hx  . Rotator cuff tear 04/21/16   right  . Seizures ([place])   . Sleep apnea     Family History: Family History  Problem Relation Age of Onset  .  Breast cancer Paternal Aunt 64  . Diabetes Father   . Heart disease Father     Social History: Social History   Socioeconomic History  . Marital status: Single    Spouse name: Not on file  . Number of children: Not on file  . Years of education: Not on file  . Highest education level: Not on file  Occupational History  . Not on file  Social Needs  . Financial resource strain: Not on file  . Food insecurity    Worry: Not on file    Inability: Not on file  . Transportation needs    Medical: Not on file    Non-medical: Not on file  Tobacco Use  . Smoking status: Former Smoker    Packs/day: 1.00    Years: 40.00    Pack years: 40.00    Quit date: 05/12/2005  Years since quitting: 14.2  . Smokeless tobacco: Never Used  Substance and Sexual Activity  . Alcohol use: No  . Drug use: No  . Sexual activity: Not on file  Lifestyle  . Physical activity    Days per week: Not on file    Minutes per session: Not on file  . Stress: Not on file  Relationships  . Social Herbalist on phone: Not on file    Gets together: Not on file    Attends religious service: Not on file    Active member of club or organization: Not on file    Attends meetings of clubs or organizations: Not on file    Relationship status: Not on file  . Intimate partner violence    Fear of current or ex partner: Not on file    Emotionally abused: Not on file    Physically abused: Not on file    Forced sexual activity: Not on file  Other Topics Concern  . Not on file  Social History Narrative  . Not on file    Vital Signs: Blood pressure 126/70, resp. rate 16, height 5\' 7"  (1.702 m), weight 222 lb (100.7 kg), SpO2 98 %.  Examination: General Appearance: The patient is well-developed, well-nourished, and in no distress. Skin: Gross inspection of skin unremarkable. Head: normocephalic, no gross deformities. Eyes: no gross deformities noted. ENT: ears appear grossly normal no exudates. Neck:  Supple. No thyromegaly. No LAD. Respiratory: clear bilaterally. Cardiovascular: Normal S1 and S2 without murmur or rub. Extremities: No cyanosis. pulses are equal. Neurologic: Alert and oriented. No involuntary movements.  LABS: Recent Results (from the past 2160 hour(s))  Hepatic function panel     Status: None   Collection Time: 05/23/19  2:27 PM  Result Value Ref Range   Total Protein 6.5 6.5 - 8.1 g/dL   Albumin 3.9 3.5 - 5.0 g/dL   AST 32 15 - 41 U/L   ALT 34 0 - 44 U/L   Alkaline Phosphatase 86 38 - 126 U/L   Total Bilirubin 0.9 0.3 - 1.2 mg/dL   Bilirubin, Direct 0.2 0.0 - 0.2 mg/dL   Indirect Bilirubin 0.7 0.3 - 0.9 mg/dL    Comment: Performed at [facility], [address], [place], [place] [address]  CBC with Differential/Platelet     Status: Abnormal   Collection Time: 05/23/19  2:27 PM  Result Value Ref Range   WBC 13.8 (H) 4.0 - 10.5 K/uL   RBC 4.01 3.87 - 5.11 MIL/uL   Hemoglobin 13.6 12.0 - 15.0 g/dL   HCT 40.2 36.0 - 46.0 %   MCV 100.2 (H) 80.0 - 100.0 fL   MCH 33.9 26.0 - 34.0 pg   MCHC 33.8 30.0 - 36.0 g/dL   RDW 13.6 11.5 - 15.5 %   Platelets 296 150 - 400 K/uL   nRBC 0.0 0.0 - 0.2 %   Neutrophils Relative % 82 %   Neutro Abs 11.4 (H) 1.7 - 7.7 K/uL   Lymphocytes Relative 5 %   Lymphs Abs 0.7 0.7 - 4.0 K/uL   Monocytes Relative 7 %   Monocytes Absolute 0.9 0.1 - 1.0 K/uL   Eosinophils Relative 4 %   Eosinophils Absolute 0.5 0.0 - 0.5 K/uL   Basophils Relative 1 %   Basophils Absolute 0.1 0.0 - 0.1 K/uL   Immature Granulocytes 1 %   Abs Immature Granulocytes 0.14 (H) 0.00 - 0.07 K/uL    Comment: Performed  at [facility], [address], [place], [place] [address]  Comprehensive metabolic panel     Status: Abnormal   Collection Time: 06/01/19 10:27 AM  Result Value Ref Range   Sodium 142 135 - 145 mmol/L   Potassium 4.0 3.5 - 5.1 mmol/L   Chloride 104 98 - 111 mmol/L   CO2 27 22 - 32 mmol/L   Glucose, Bld 129 (H) 70 -  99 mg/dL   BUN 15 8 - 23 mg/dL   Creatinine, Ser 0.90 0.44 - 1.00 mg/dL   Calcium 9.8 8.9 - 10.3 mg/dL   Total Protein 6.9 6.5 - 8.1 g/dL   Albumin 4.4 3.5 - 5.0 g/dL   AST 31 15 - 41 U/L   ALT 32 0 - 44 U/L   Alkaline Phosphatase 76 38 - 126 U/L   Total Bilirubin 1.3 (H) 0.3 - 1.2 mg/dL   GFR calc non Af Amer >60 >60 mL/min   GFR calc Af Amer >60 >60 mL/min   Anion gap 11 5 - 15    Comment: Performed at [facility], [address] [place], [place] [address]  CBC with Differential     Status: Abnormal   Collection Time: 06/01/19 10:27 AM  Result Value Ref Range   WBC 10.6 (H) 4.0 - 10.5 K/uL   RBC 4.19 3.87 - 5.11 MIL/uL   Hemoglobin 14.1 12.0 - 15.0 g/dL   HCT 42.9 36.0 - 46.0 %   MCV 102.4 (H) 80.0 - 100.0 fL   MCH 33.7 26.0 - 34.0 pg   MCHC 32.9 30.0 - 36.0 g/dL   RDW 14.3 11.5 - 15.5 %   Platelets 326 150 - 400 K/uL   nRBC 0.0 0.0 - 0.2 %   Neutrophils Relative % 84 %   Neutro Abs 8.9 (H) 1.7 - 7.7 K/uL   Lymphocytes Relative 4 %   Lymphs Abs 0.4 (L) 0.7 - 4.0 K/uL   Monocytes Relative 7 %   Monocytes Absolute 0.8 0.1 - 1.0 K/uL   Eosinophils Relative 3 %   Eosinophils Absolute 0.3 0.0 - 0.5 K/uL   Basophils Relative 1 %   Basophils Absolute 0.1 0.0 - 0.1 K/uL   Immature Granulocytes 1 %   Abs Immature Granulocytes 0.09 (H) 0.00 - 0.07 K/uL    Comment: Performed at [facility], [place], [place] [address]  Troponin I (High Sensitivity)     Status: None   Collection Time: 06/01/19 10:27 AM  Result Value Ref Range   Troponin I (High Sensitivity) 9 <18 ng/L    Comment: (NOTE) Elevated high sensitivity troponin I (hsTnI) values and significant  changes across serial measurements may suggest ACS but many other  chronic and acute conditions are known to elevate hsTnI results.  Refer to the "Links" section for chest pain algorithms and additional  guidance. Performed at [facility], [address] [place], [place]  [address]   Brain natriuretic peptide     Status: Abnormal   Collection Time: 06/01/19 10:28 AM  Result Value Ref Range   B Natriuretic Peptide 359.0 (H) 0.0 - 100.0 pg/mL    Comment: Performed at [facility], [address] [place], [place] [address]  Lactic acid, plasma     Status: None   Collection Time: 06/01/19 10:46 AM  Result Value Ref Range   Lactic Acid, Venous 1.3 0.5 - 1.9 mmol/L    Comment: Performed at [facility], [address] [place], [place] [address]  Urinalysis, Complete w Microscopic     Status: Abnormal   Collection Time: 06/01/19 10:46 AM  Result Value Ref Range   Color, Urine YELLOW (A) YELLOW   APPearance HAZY (A) CLEAR   Specific Gravity, Urine 1.006 1.005 - 1.030   pH 6.0 5.0 - 8.0   Glucose, UA NEGATIVE NEGATIVE mg/dL   Hgb urine dipstick NEGATIVE NEGATIVE   Bilirubin Urine NEGATIVE NEGATIVE   Ketones, ur NEGATIVE NEGATIVE mg/dL   Protein, ur 30 (A) NEGATIVE mg/dL   Nitrite NEGATIVE NEGATIVE   Leukocytes,Ua TRACE (A) NEGATIVE   RBC / HPF 0-5 0 - 5 RBC/hpf   WBC, UA 0-5 0 - 5 WBC/hpf   Bacteria, UA NONE SEEN NONE SEEN   Squamous Epithelial / LPF 0-5 0 - 5   Mucus PRESENT     Comment: Performed at [facility], [address], [place], [place] [address]  SARS Coronavirus 2 (CEPHEID - Performed in [place] hospital lab), Hosp Order     Status: None   Collection Time: 06/01/19 10:46 AM   Specimen: Nasopharyngeal Swab  Result Value Ref Range   SARS Coronavirus 2 NEGATIVE NEGATIVE    Comment: (NOTE) If result is NEGATIVE SARS-CoV-2 target nucleic acids are NOT DETECTED. The SARS-CoV-2 RNA is generally detectable in upper and lower  respiratory specimens during the acute phase of infection. The lowest  concentration of SARS-CoV-2 viral copies this assay can detect is 250  copies / mL. A negative result does not preclude SARS-CoV-2 infection  and should not be used as the sole basis for treatment or other  patient  management decisions.  A negative result may occur with  improper specimen collection / handling, submission of specimen other  than nasopharyngeal swab, presence of viral mutation(s) within the  areas targeted by this assay, and inadequate number of viral copies  (<250 copies / mL). A negative result must be combined with clinical  observations, patient history, and epidemiological information. If result is POSITIVE SARS-CoV-2 target nucleic acids are DETECTED. The SARS-CoV-2 RNA is generally detectable in upper and lower  respiratory specimens dur ing the acute phase of infection.  Positive  results are indicative of active infection with SARS-CoV-2.  Clinical  correlation with patient history and other diagnostic information is  necessary to determine patient infection status.  Positive results do  not rule out bacterial infection or co-infection with other viruses. If result is PRESUMPTIVE POSTIVE SARS-CoV-2 nucleic acids MAY BE PRESENT.   A presumptive positive result was obtained on the submitted specimen  and confirmed on repeat testing.  While 2019 novel coronavirus  (SARS-CoV-2) nucleic acids may be present in the submitted sample  additional confirmatory testing may be necessary for epidemiological  and / or clinical management purposes  to differentiate between  SARS-CoV-2 and other Sarbecovirus currently known to infect humans.  If clinically indicated additional testing with an alternate test  methodology [phone]) is advised. The SARS-CoV-2 RNA is generally  detectable in upper and lower respiratory sp ecimens during the acute  phase of infection. The expected result is Negative. Fact Sheet for Patients:  StrictlyIdeas.no Fact Sheet for Healthcare Providers: BankingDealers.co.za This test is not yet approved or cleared by the [place] FDA and has been authorized for detection and/or diagnosis of SARS-CoV-2 by FDA under  an Emergency Use Authorization (EUA).  This EUA will remain in effect (meaning this test can be used) for the duration of the COVID-19 declaration under  Section 564(b)(1) of the Act, 21 U.S.C. section 360bbb-3(b)(1), unless the authorization is terminated or revoked sooner. Performed at [facility], [address] [place], [place] [address]   Lactic acid, plasma     Status: None   Collection Time: 06/01/19 12:42 PM  Result Value Ref Range   Lactic Acid, Venous 0.9 0.5 - 1.9 mmol/L    Comment: Performed at [facility], [place], [place] [address]  Troponin I (High Sensitivity)     Status: None   Collection Time: 06/01/19 12:42 PM  Result Value Ref Range   Troponin I (High Sensitivity) 8 <18 ng/L    Comment: (NOTE) Elevated high sensitivity troponin I (hsTnI) values and significant  changes across serial measurements may suggest ACS but many other  chronic and acute conditions are known to elevate hsTnI results.  Refer to the "Links" section for chest pain algorithms and additional  guidance. Performed at [facility], [address] [place], [place] [address]   Glucose, capillary     Status: Abnormal   Collection Time: 06/01/19  9:08 PM  Result Value Ref Range   Glucose-Capillary 334 (H) 70 - 99 mg/dL  Basic metabolic panel     Status: Abnormal   Collection Time: 06/02/19  5:08 AM  Result Value Ref Range   Sodium 139 135 - 145 mmol/L   Potassium 3.9 3.5 - 5.1 mmol/L   Chloride 101 98 - 111 mmol/L   CO2 28 22 - 32 mmol/L   Glucose, Bld 128 (H) 70 - 99 mg/dL   BUN 19 8 - 23 mg/dL   Creatinine, Ser 0.81 0.44 - 1.00 mg/dL   Calcium 9.6 8.9 - 10.3 mg/dL   GFR calc non Af Amer >60 >60 mL/min   GFR calc Af Amer >60 >60 mL/min   Anion gap 10 5 - 15    Comment: Performed at [facility], [address] [place], [place] [address]  CBC     Status: Abnormal   Collection Time: 06/02/19  5:08 AM  Result Value Ref Range   WBC  15.1 (H) 4.0 - 10.5 K/uL   RBC 3.97 3.87 - 5.11 MIL/uL   Hemoglobin 13.2 12.0 - 15.0 g/dL   HCT 40.3 36.0 - 46.0 %   MCV 101.5 (H) 80.0 - 100.0 fL   MCH 33.2 26.0 - 34.0 pg   MCHC 32.8 30.0 - 36.0 g/dL   RDW 13.9 11.5 - 15.5 %   Platelets 323 150 - 400 K/uL   nRBC 0.0 0.0 - 0.2 %    Comment: Performed at [facility], [address] [place], [place] [address]  ECHOCARDIOGRAM COMPLETE     Status: None   Collection Time: 06/02/19  7:00 AM  Result Value Ref Range   Weight 3,664 oz   Height 67 in   BP 163/76 mmHg  Glucose, capillary     Status: Abnormal   Collection Time: 06/02/19  8:04 AM  Result Value Ref Range   Glucose-Capillary 131 (H) 70 - 99 mg/dL  Glucose, capillary     Status: Abnormal   Collection Time: 06/02/19 11:42 AM  Result Value Ref Range   Glucose-Capillary 197 (H) 70 - 99 mg/dL  Glucose, capillary     Status: Abnormal   Collection Time: 06/02/19  4:31 PM  Result Value Ref Range   Glucose-Capillary 132 (H) 70 - 99 mg/dL  Glucose, capillary     Status: Abnormal   Collection Time: 06/02/19  8:51 PM  Result Value Ref Range   Glucose-Capillary 180 (H) 70 - 99 mg/dL  CBC     Status: Abnormal   Collection Time: 06/03/19  4:55 AM  Result Value Ref Range   WBC 13.2 (H) 4.0 - 10.5 K/uL   RBC 3.81 (L) 3.87 - 5.11 MIL/uL   Hemoglobin 12.7 12.0 - 15.0 g/dL   HCT 38.2 36.0 - 46.0 %   MCV 100.3 (H) 80.0 - 100.0 fL   MCH 33.3 26.0 - 34.0 pg   MCHC 33.2 30.0 - 36.0 g/dL   RDW 13.7 11.5 - 15.5 %   Platelets 295 150 - 400 K/uL   nRBC 0.0 0.0 - 0.2 %    Comment: Performed at [facility], [address] [place], [place] [id number]  Basic metabolic panel     Status: Abnormal   Collection Time: 06/03/19  4:55 AM  Result Value Ref Range   Sodium 134 (L) 135 - 145 mmol/L   Potassium 3.5 3.5 - 5.1 mmol/L   Chloride 98 98 - 111 mmol/L   CO2 27 22 - 32 mmol/L   Glucose, Bld 130 (H) 70 - 99 mg/dL   BUN 30 (H) 8 - 23 mg/dL   Creatinine, Ser 0.93 0.44 -  1.00 mg/dL   Calcium 8.9 8.9 - 10.3 mg/dL   GFR calc non Af Amer 60 (L) >60 mL/min   GFR calc Af Amer >60 >60 mL/min   Anion gap 9 5 - 15    Comment: Performed at [facility], [address] [place], [place] [address]  Glucose, capillary     Status: Abnormal   Collection Time: 06/03/19  8:02 AM  Result Value Ref Range   Glucose-Capillary 122 (H) 70 - 99 mg/dL  Glucose, capillary     Status: Abnormal   Collection Time: 06/03/19 11:23 AM  Result Value Ref Range   Glucose-Capillary 177 (H) 70 - 99 mg/dL    Radiology: Dg Chest 2 View  Result Date: 06/03/2019 CLINICAL DATA:  Congestive heart failure. EXAM: CHEST - 2 VIEW COMPARISON:  06/01/2019 FINDINGS: Stable top-normal heart size. Diminishment in pulmonary edema since the prior study with venous hypertensive changes remaining. Small bilateral pleural effusions remain, left greater than right. No focal airspace disease. IMPRESSION: Diminishment in pulmonary edema since the prior study. Small bilateral pleural effusions remain, left greater than right. Electronically Signed   By: Aletta Edouard M.D.   On: 06/03/2019 07:55   US Venous Img Lower Unilateral Right  Result Date: 06/02/2019 CLINICAL DATA:  Swelling EXAM: RIGHT LOWER EXTREMITY VENOUS DOPPLER ULTRASOUND TECHNIQUE: Gray-scale sonography with compression, as well as color and duplex ultrasound, were performed to evaluate the deep venous system from the level of the common femoral vein through the popliteal and proximal calf veins. COMPARISON:  04/30/2014 FINDINGS: Normal compressibility of the common femoral, superficial femoral, and popliteal veins, as well as the proximal calf veins. No filling defects to suggest DVT on grayscale or color Doppler imaging. Doppler waveforms show normal direction of venous flow, normal respiratory phasicity and response to augmentation. Survey views of the contralateral common femoral vein are unremarkable. IMPRESSION: No femoropopliteal and no  calf DVT in the visualized calf veins. If clinical symptoms are inconsistent or if there are persistent or worsening symptoms, further imaging (possibly involving the iliac veins) may be warranted. Electronically Signed   By: Lucrezia Europe M.D.   On: 06/02/2019 08:12    No results found.  No results found.  Assessment and Plan: Patient Active Problem List   Diagnosis Date Noted  . C. difficile diarrhea 11/24/2018  . Diarrhea 11/18/2018  . Nausea without vomiting 11/18/2018  . Goals of care, counseling/discussion 09/12/2018  . 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 History of repair of right rotator cuff 08/25/2016  . Acute respiratory failure with hypoxia ([place]) 08/06/2016  . Nontraumatic tear of right rotator cuff 04/21/2016  . Atypical chest pain 04/07/2016  . Impingement syndrome of right shoulder 12/29/2015  . Primary osteoarthritis of first carpometacarpal joint of left hand 10/27/2015  . Right shoulder pain 10/27/2015  . OSA on CPAP 10/14/2015  . COPD with asthma ([place]) 07/13/2015  . Type 2 diabetes mellitus with microalbuminuria, without long-term current use of insulin ([place]) 07/13/2015  . Essential hypertension 03/05/2015  . Difficulty sleeping 11/26/2014  . CRI (chronic renal insufficiency) 07/31/2014  . Seizure disorder ([place]) 07/31/2014  . History of colonic polyps 05/21/2014  . Hyperlipidemia, unspecified 04/30/2014    1. OSA on CPAP Patient scheduled using CPAP and will continue to use CPAP at night.  2. Chronic obstructive pulmonary disease, unspecified COPD type ([place]) Symptoms COPD are well controlled at this time.  Patient will continue to use inhalers as prescribed.  3. Chronic respiratory failure with hypoxia ([facility]) Stable currently patient will continue to use oxygen continuously at 3 L/min.  4. Seasonal allergic rhinitis due to pollen Refill patient's Flonase.  She religiously takes the fries during the day with her allergy medications.  She has good  relief of symptoms when on this regimen. - fluticasone (FLONASE) 50 MCG/ACT nasal spray; Place 2 sprays into both nostrils daily. (Patient not taking: Reported on 08/08/2019)  Dispense: 16 g; Refill: 2  5. SOB (shortness of breath) Patient felt 6-minute walk without oxygen will place order for portable concentrator to see because of patient with obtaining 1. - Spirometry with Graph - 6 minute walk  General Counseling: I have discussed the findings of the evaluation and examination with Arbie Cookey.  I have also discussed any further diagnostic evaluation thatmay be needed or ordered today. Oriah verbalizes understanding of the findings of todays visit. We also reviewed her medications today and discussed drug interactions and side effects including but not limited excessive drowsiness and altered mental states. We also discussed that there is always a risk not just to her but also people around her. she has been encouraged to call the office with any questions or concerns that should arise related to todays visit.    Time spent: 25 This patient was seen by Orson Gear AGNP-C in Collaboration with Dr. Devona Konig as a part of collaborative care agreement.   I have personally obtained a history, examined the patient, evaluated laboratory and imaging results, formulated the assessment and plan and placed orders.    Allyne Gee, MD [facility] Pulmonary and Critical Care Sleep medicine

## 2019-08-08 NOTE — Progress Notes (Signed)
No new changes noted today 

## 2019-08-08 NOTE — Telephone Encounter (Signed)
spoke with dr Ubaldo Glassing office ( nurse Nira Conn) to inform her that the patient was taken lasix and her potassium was 3 today with labs. Ms Nira Conn gave the order that they will start Potassium 20 meq 1 tab po daily and she will get the script to the pharmacy today. The patient was made aware.

## 2019-08-09 ENCOUNTER — Telehealth: Payer: Self-pay

## 2019-08-09 DIAGNOSIS — R809 Proteinuria, unspecified: Secondary | ICD-10-CM | POA: Insufficient documentation

## 2019-08-09 MED ORDER — HYDROXYUREA 500 MG PO CAPS: 500.0000 mg | ORAL_CAPSULE | Freq: Every day | ORAL | 0 refills | Status: DC

## 2019-08-09 NOTE — Telephone Encounter (Signed)
CMN SIGNED AND PLACED IN [employer] PAT[employer] FOLDER. °

## 2019-08-12 ENCOUNTER — Other Ambulatory Visit: Payer: Self-pay

## 2019-09-03 ENCOUNTER — Ambulatory Visit: Payer: Medicare Other

## 2019-09-03 ENCOUNTER — Ambulatory Visit
Admission: EM | Admit: 2019-09-03 | Discharge: 2019-09-03 | Disposition: A | Discharge status: Home / Self Care | Payer: Medicare Other | Attending: Family Medicine | Admitting: Family Medicine

## 2019-09-03 ENCOUNTER — Other Ambulatory Visit: Payer: Self-pay

## 2019-09-03 ENCOUNTER — Encounter: Payer: Self-pay | Admitting: Emergency Medicine

## 2019-09-03 DIAGNOSIS — W108XXA Fall (on) (from) other stairs and steps, initial encounter: Secondary | ICD-10-CM | POA: Diagnosis not present

## 2019-09-03 DIAGNOSIS — M25571 Pain in right ankle and joints of right foot: Secondary | ICD-10-CM | POA: Diagnosis present

## 2019-09-03 DIAGNOSIS — M79671 Pain in right foot: Secondary | ICD-10-CM | POA: Insufficient documentation

## 2019-09-03 NOTE — ED Triage Notes (Addendum)
Patient here today c/o ? Sprain ankle with swelling stated that she was going to her patio stepped down and twisted her right  ankle x 3d ago   Has a lot pain unable to sleep and apply weight.  OTC:[place]

## 2019-09-03 NOTE — ED Provider Notes (Signed)
[facility] ____________________________________________  Time seen: Approximately 11:58 AM  I have reviewed the triage vital signs and the nursing notes.   HISTORY  Chief Complaint No chief complaint on file.  HPI Angelica Juarez is a 78 y.o. female presenting for evaluation of right ankle pain after injury. Reports 3 days ago, she was walking to her patio which has a small step down, and when she stepped down she twisted her foot and felt pain. Report pain and swelling with difficulty weight bearing since. Tried over the counter ibuprofen without resolution. Denies pain radiation or paresthesias. Reports right leg otherwise nontender. Reports otherwise doing well.   Sofie Hartigan, MD: PCP    Past Medical History:  Diagnosis Date  . Allergy    Seasonal  . Arthritis   . 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 Chronic kidney disease    chronic renal insufficiency  . Colon polyps 05/21/14  . COPD (chronic obstructive pulmonary disease) ([place])   . Diabetes mellitus without complication ([place])   . Dyspnea   . Fracture closed, humerus, shaft    right   . History of kidney stones    40 years ago  . Hyperlipidemia 04/30/14  . Hypertension   . Impingement syndrome of right shoulder 12/29/15  . Personal history of radiation therapy   . Pneumonia    hx  . Rotator cuff tear 04/21/16   right  . Seizures ([place])   . Sleep apnea     Patient Active Problem List   Diagnosis Date Noted  . C. difficile diarrhea 11/24/2018  . Diarrhea 11/18/2018  . Nausea without vomiting 11/18/2018  . Goals of care, counseling/discussion 09/12/2018  . 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 History of repair of right rotator cuff 08/25/2016  . Acute respiratory failure with hypoxia ([place]) 08/06/2016  . Nontraumatic tear of right rotator cuff 04/21/2016  . Atypical chest pain 04/07/2016  . Impingement syndrome of right shoulder 12/29/2015  . Primary osteoarthritis of first  carpometacarpal joint of left hand 10/27/2015  . Right shoulder pain 10/27/2015  . OSA on CPAP 10/14/2015  . 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 Type 2 diabetes mellitus with microalbuminuria, without long-term current use of insulin ([place]) 07/13/2015  . Essential hypertension 03/05/2015  . Difficulty sleeping 11/26/2014  . CRI (chronic renal insufficiency) 07/31/2014  . Seizure disorder ([place]) 07/31/2014  . History of colonic polyps 05/21/2014  . Hyperlipidemia, unspecified 04/30/2014    Past Surgical History:  Procedure Laterality Date  . ABDOMINAL HYSTERECTOMY    . 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 EYE SURGERY     bilateral cataracts  . JOINT REPLACEMENT     bilateral knee replacement  . NASAL SEPTUM SURGERY    . OOPHORECTOMY    . REPLACEMENT TOTAL KNEE Bilateral 2004  . SHOULDER ARTHROSCOPY WITH BICEPSTENOTOMY Right 05/25/2016   Procedure: SHOULDER ARTHROSCOPY WITH BICEPSTENOTOMY;  Surgeon: Leanor Kail, MD;  Location: [facility] ORS;  Service: Orthopedics;  Laterality: Right;  . SHOULDER ARTHROSCOPY WITH DISTAL CLAVICLE RESECTION Right 05/25/2016   Procedure: SHOULDER ARTHROSCOPY WITH DISTAL CLAVICLE RESECTION;  Surgeon: Leanor Kail, MD;  Location: [facility] ORS;  Service: Orthopedics;  Laterality: Right;  . SHOULDER ARTHROSCOPY WITH OPEN ROTATOR CUFF REPAIR Right 05/25/2016   Procedure: SHOULDER ARTHROSCOPY WITH OPEN ROTATOR CUFF REPAIR;  Surgeon: Leanor Kail, MD;  Location: [facility] OR[facility];  Service: Orthopedics;  Laterality: Right;  . SUBACROMIAL DECOMPRESSION Right 05/25/2016  Procedure: SUBACROMIAL DECOMPRESSION;  Surgeon: Leanor Kail, MD;  Location: [facility] OR[facility];  Service: Orthopedics;  Laterality: Right;     No current facility-administered medications for this encounter.   Current Outpatient Medications:  .  albuterol (ACCUNEB) 1.25 MG/3ML nebulizer solution, Take 3 mLs (1.25 mg total) by nebulization every 6 (six) hours as needed. wheezing, Disp: 75 mL,  Rfl: 3 .  apixaban (ELIQUIS) 5 MG TABS tablet, Take 1 tablet (5 mg total) by mouth 2 (two) times daily., Disp: 60 tablet, Rfl: 0 .  aspirin 81 MG tablet, Take 81 mg by mouth daily., Disp: , Rfl:  .  atorvastatin (LIPITOR) 40 MG tablet, Take 40 mg by mouth daily., Disp: , Rfl:  .  B Complex Vitamins (VITAMIN B COMPLEX PO), Take 1 tablet by mouth daily., Disp: , Rfl:  .  Calcium Carbonate-Vitamin D 600-200 MG-UNIT CAPS, Take 1 capsule by mouth daily., Disp: , Rfl:  .  cloNIDine (CATAPRES) 0.2 MG tablet, Take 0.2 mg by mouth 2 (two) times daily., Disp: , Rfl:  .  diphenhydrAMINE (BENADRYL) 25 mg capsule, Take 25 mg by mouth daily., Disp: , Rfl:  .  EPINEPHrine 0.3 mg/0.3 mL IJ SOAJ injection, Inject 0.3 mg into the muscle as needed for anaphylaxis. , Disp: , Rfl:  .  fluticasone (FLONASE) 50 MCG/ACT nasal spray, Place 2 sprays into both nostrils daily., Disp: 16 g, Rfl: 2 .  Fluticasone-Umeclidin-Vilant (TRELEGY ELLIPTA) 100-62.5-25 MCG/INH AEPB, Inhale 1 puff into the lungs daily., Disp: 1 each, Rfl: 4 .  furosemide (LASIX) 20 MG tablet, Take 1 tablet (20 mg total) by mouth 2 (two) times daily., Disp: 60 tablet, Rfl: 0 .  hydrALAZINE (APRESOLINE) 100 MG tablet, Take 50 mg by mouth 3 (three) times daily. , Disp: , Rfl:  .  hydroxyurea (HYDREA) 500 MG capsule, Take 1 capsule (500 mg total) by mouth daily. May take with food to minimize GI side effects., Disp: 90 capsule, Rfl: 0 .  levETIRAcetam (KEPPRA) 750 MG tablet, Take 750 mg by mouth 2 (two) times daily., Disp: , Rfl:  .  metFORMIN (GLUCOPHAGE) 500 MG tablet, Take 500 mg by mouth 2 (two) times daily with a meal., Disp: , Rfl:  .  metoprolol succinate (TOPROL-XL) 100 MG 24 hr tablet, Take 100 mg by mouth daily., Disp: , Rfl:  .  montelukast (SINGULAIR) 10 MG tablet, TAKE 1 TABLET BY MOUTH EVERY DAY (Patient taking differently: Take 10 mg by mouth daily. ), Disp: 90 tablet, Rfl: 0 .  NIFEdipine (NIFEDICAL XL) 30 MG 24 hr tablet, Take 30 mg by mouth  daily., Disp: , Rfl:  .  OVER THE COUNTER MEDICATION, Place 1 drop into both eyes daily. Allergy eye drops, Disp: , Rfl:  .  OXYGEN, Inhale 2 L into the lungs., Disp: , Rfl:  .  sitaGLIPtin (JANUVIA) 25 MG tablet, Take 1 tablet by mouth daily., Disp: , Rfl:  .  traZODone (DESYREL) 50 MG tablet, Take 50-100 mg by mouth at bedtime as needed for sleep. , Disp: , Rfl: 0 .  valsartan-hydrochlorothiazide (DIOVAN-HCT) 320-25 MG tablet, Take 1 tablet by mouth daily., Disp: , Rfl: 0 .  VENTOLIN HFA 108 (90 Base) MCG/ACT inhaler, INHALE 2 PUFFS INTO THE LUNGS EVERY 6 HOURS AS NEEDED (Patient taking differently: Inhale 2 puffs into the lungs every 6 (six) hours as needed for wheezing or shortness of breath. ), Disp: 54 g, Rfl: 1 .  vitamin C (ASCORBIC ACID) 500 MG tablet, Take 500 mg by mouth 2 (  two) times daily., Disp: , Rfl:  .  Multiple Vitamin (MULTI-VITAMINS) TABS, Take 1 tablet by mouth daily., Disp: , Rfl:   Allergies Iodinated diagnostic agents, Latex, Phenobarbital, and Tape  Family History  Problem Relation Age of Onset  . Breast cancer Paternal Aunt 50  . Diabetes Father   . Heart disease Father     Social History Social History   Tobacco Use  . Smoking status: Former Smoker    Packs/day: 1.00    Years: 40.00    Pack years: 40.00    Quit date: 05/12/2005    Years since quitting: 14.3  . Smokeless tobacco: Never Used  Substance Use Topics  . Alcohol use: No  . Drug use: No    Review of Systems Constitutional: No fever ENT: No sore throat. Cardiovascular: Denies chest pain. Respiratory: Denies shortness of breath. Musculoskeletal: Positive right ankle pain. Skin: Negative for rash.   ____________________________________________   PHYSICAL EXAM:  VITAL SIGNS: ED Triage Vitals  Enc Vitals Group     BP 09/03/19 1142 139/68     Pulse Rate 09/03/19 1142 94     Resp 09/03/19 1142 18     Temp 09/03/19 1142 99.4 F (37.4 C)     Temp Source 09/03/19 1142 Oral     SpO2  09/03/19 1142 99 %     Weight 09/03/19 1137 216 lb (98 kg)     Height 09/03/19 1137 5\' 8"  (1.727 m)     Head Circumference --      Peak Flow --      Pain Score 09/03/19 1148 10     Pain Loc --      Pain Edu? --      Excl. in [place]? --     Constitutional: Alert and oriented. Well appearing and in no acute distress. Eyes: Conjunctivae are normal.  ENT      Head: Normocephalic and atraumatic. Cardiovascular:Good peripheral circulation. Respiratory: Normal respiratory effort without tachypnea nor retractions. Musculoskeletal: Gait not tested due to pain. Bilateral pedal pulses equal and easily palpated. Except: Right ankle and mid dorsal foot diffuse tenderness to direct palpation with localized edema, pain with ankle rotation and dorsiflexion, normal distal sensation and capillary refill, skin intact, right lower extremity otherwise nontender. Neurologic:  Normal speech and language.  Skin:  Skin is warm, dry and intact. No rash noted. Psychiatric: Mood and affect are normal. Speech and behavior are normal. Patient exhibits appropriate insight and judgment   ___________________________________________   LABS (all labs ordered are listed, but only abnormal results are displayed)  Labs Reviewed - No data to display  RADIOLOGY  Dg Ankle Complete Right  Result Date: 09/03/2019 CLINICAL DATA:  Pain and swelling. Stepped down hard on right foot and now with lateral pain. EXAM: RIGHT ANKLE - COMPLETE 3+ VIEW COMPARISON:  None. FINDINGS: The ankle mortise is maintained. Mild ankle joint degenerative changes. No definite joint effusion. Small bony densities projecting off the distal tip of the medial malleolus are likely old avulsion injuries or unfused secondary ossification centers. I do not see any definite findings for an acute ankle fracture. No osteochondral lesion. Arterial calcifications are noted. There are also some calcifications along the plantar fascial attachment on the calcaneus.  IMPRESSION: Mild ankle joint degenerative changes but no acute ankle fracture. Electronically Signed   By: Marijo Sanes M.D.   On: 09/03/2019 12:29   Dg Foot Complete Right  Result Date: 09/03/2019 CLINICAL DATA:  Right lateral foot pain. EXAM: RIGHT  FOOT COMPLETE - 3+ VIEW COMPARISON:  None. FINDINGS: The joint spaces are fairly well maintained. Mild degenerative changes. No acute fracture is identified. No destructive bony changes. IMPRESSION: No acute fracture. Electronically Signed   By: Marijo Sanes M.D.   On: 09/03/2019 12:31   ____________________________________________   PROCEDURES Procedures     INITIAL IMPRESSION / ASSESSMENT AND PLAN / ED COURSE  Pertinent labs & imaging results that were available during my care of the patient were reviewed by me and considered in my medical decision making (see chart for details).  Appearing patient.  No acute distress.  Right ankle and foot pain post mechanical injury that occurred 3 days ago.  Right foot and right ankle x-rays obtained.  X-ray as above per radiologist, no acute fracture, mild ankle joint degenerative changes but no acute ankle fracture.  Suspect sprain injuries.  Velcro ASO splint applied for support, standard walker given.  Instructed to elevate ice, over-the-counter Tylenol as needed, gradual increase of activity as tolerated.  Supportive care.  Discussed follow up with Primary care physician this week. Discussed follow up and return parameters including no resolution or any worsening concerns. Patient verbalized understanding and agreed to plan.   ____________________________________________   FINAL CLINICAL IMPRESSION(S) / ED DIAGNOSES  Final diagnoses:  Acute right ankle pain  Right foot pain     ED Discharge Orders    None       Note: This dictation was prepared with Dragon dictation along with smaller phrase technology. Any transcriptional errors that result from this process are unintentional.          Marylene Land, NP 09/03/19 1407

## 2019-09-03 NOTE — Discharge Instructions (Signed)
Take over-the-counter Tylenol as needed.  Wear splint and use walker.  Gradually apply weight as tolerated.  Ice and elevate.  Monitor.  Follow up with your primary care physician this week as needed. Return to Urgent care for new or worsening concerns.

## 2019-09-09 ENCOUNTER — Ambulatory Visit
Admission: EM | Admit: 2019-09-09 | Discharge: 2019-09-09 | Disposition: A | Discharge status: Home / Self Care | Payer: Medicare Other | Attending: Internal Medicine | Admitting: Internal Medicine

## 2019-09-09 ENCOUNTER — Other Ambulatory Visit: Payer: Self-pay

## 2019-09-09 DIAGNOSIS — W108XXA Fall (on) (from) other stairs and steps, initial encounter: Secondary | ICD-10-CM

## 2019-09-09 DIAGNOSIS — S93401A Sprain of unspecified ligament of right ankle, initial encounter: Secondary | ICD-10-CM

## 2019-09-09 DIAGNOSIS — S93401D Sprain of unspecified ligament of right ankle, subsequent encounter: Secondary | ICD-10-CM

## 2019-09-09 MED ORDER — PREDNISONE 20 MG PO TABS: 20.0000 mg | ORAL_TABLET | Freq: Every day | ORAL | 0 refills | Status: AC

## 2019-09-09 NOTE — ED Provider Notes (Signed)
[facility]    CSN: [id number] Arrival date & time: 09/09/19  1402      History   Chief Complaint Chief Complaint  Patient presents with   Ankle Pain    HPI Angelica Juarez is a 78 y.o. female with a history of chronic kidney disease stage III, diabetes mellitus type 2 comes to urgent care with worsening swelling of the right ankle.  Patient was seen here about a week ago for right ankle sprain.  She cannot tolerate NSAIDs and has been using Tylenol for pain control.  Patient says over the past few days she has noticed worsening swelling of the right ankle and increasing pain.  Pain is currently severe.  Oral pain medications are not relieving pain.  Is aggravated by movement.  She currently is elevating and icing the ankle with no improvement.  She denies any reinjury to the ankle.  No erythema of the ankle.  X-ray done on previous visit was negative for any acute fractures.   HPI  Past Medical History:  Diagnosis Date   Allergy    Seasonal   Arthritis    Cancer ([place])    fallopian tubes- radiation   Chronic kidney disease    chronic renal insufficiency   Colon polyps 05/21/14   COPD (chronic obstructive pulmonary disease) (HCC)    Diabetes mellitus without complication ([place])    Dyspnea    Fracture closed, humerus, shaft    right    History of kidney stones    40 years ago   Hyperlipidemia 04/30/14   Hypertension    Impingement syndrome of right shoulder 12/29/15   Personal history of radiation therapy    Pneumonia    hx   Rotator cuff tear 04/21/16   right   Seizures ([place])    Sleep apnea     Patient Active Problem List   Diagnosis Date Noted   C. 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   EYE SURGERY     bilateral cataracts   JOINT REPLACEMENT     bilateral knee replacement   NASAL SEPTUM SURGERY     OOPHORECTOMY     REPLACEMENT TOTAL KNEE Bilateral 2004   SHOULDER ARTHROSCOPY WITH BICEPSTENOTOMY Right 05/25/2016   Procedure: SHOULDER ARTHROSCOPY WITH BICEPSTENOTOMY;  Surgeon: Leanor Kail, MD;  Location: [facility];  Service: Orthopedics;  Laterality: Right;   SHOULDER ARTHROSCOPY WITH DISTAL CLAVICLE RESECTION Right 05/25/2016   Procedure: SHOULDER ARTHROSCOPY WITH DISTAL CLAVICLE RESECTION;  Surgeon:  Leanor Kail, MD;  Location: [facility];  Service: Orthopedics;  Laterality: Right;   SHOULDER ARTHROSCOPY WITH OPEN ROTATOR CUFF REPAIR Right 05/25/2016   Procedure: SHOULDER ARTHROSCOPY WITH OPEN ROTATOR CUFF REPAIR;  Surgeon: Leanor Kail, MD;  Location: [facility];  Service: Orthopedics;  Laterality: Right;   SUBACROMIAL DECOMPRESSION Right 05/25/2016   Procedure: SUBACROMIAL DECOMPRESSION;  Surgeon: Leanor Kail, MD;  Location: [facility];  Service: Orthopedics;  Laterality: Right;    OB History   No obstetric history on file.      Home  Medications    Prior to Admission medications   Medication Sig Start Date End Date Taking? Authorizing Provider  albuterol (ACCUNEB) 1.25 MG/3ML nebulizer solution Take 3 mLs (1.25 mg total) by nebulization every 6 (six) hours as needed. wheezing 05/03/19  Yes Allyne Gee, MD  apixaban (ELIQUIS) 5 MG TABS tablet Take 1 tablet (5 mg total) by mouth 2 (two) times daily. 06/03/19  Yes Vaughan Basta, MD  aspirin 81 MG tablet Take 81 mg by mouth daily.   Yes [provider]  atorvastatin (LIPITOR) 40 MG tablet Take 40 mg by mouth daily. 04/04/16  Yes [provider]  B Complex Vitamins (VITAMIN B COMPLEX PO) Take 1 tablet by mouth daily.   Yes [provider]  Calcium Carbonate-Vitamin D 600-200 MG-UNIT CAPS Take 1 capsule by mouth daily.   Yes [provider]  cloNIDine (CATAPRES) 0.2 MG tablet Take 0.2 mg by mouth 2 (two) times daily.   Yes [provider]  fluticasone (FLONASE) 50 MCG/ACT nasal spray Place 2 sprays into both nostrils daily. 08/08/19  Yes Scar[place], Audie Clear, NP  Fluticasone-Umeclidin-Vilant (TRELEGY ELLIPTA) 100-62.5-25 MCG/INH AEPB Inhale 1 puff into the lungs daily. 08/16/18  Yes Allyne Gee, MD  furosemide (LASIX) 20 MG tablet Take 1 tablet (20 mg total) by mouth 2 (two) times daily. 08/08/16  Yes Demetrios Loll, MD  hydrALAZINE (APRESOLINE) 100 MG tablet Take 50 mg by mouth 3 (three) times daily.  02/04/16  Yes [provider]  diphenhydrAMINE (BENADRYL) 25 mg capsule Take 25 mg by mouth daily.    [provider]  EPINEPHrine 0.3 mg/0.3 mL IJ SOAJ injection Inject 0.3 mg into the muscle as needed for anaphylaxis.  01/24/19   [provider]  levETIRAcetam (KEPPRA) 750 MG tablet Take 750 mg by mouth 2 (two) times daily. 03/14/16   [provider]  metFORMIN (GLUCOPHAGE) 500 MG tablet Take 500 mg by mouth 2 (two) times daily with a meal. 12/25/15   [provider]  metoprolol succinate  (TOPROL-XL) 100 MG 24 hr tablet Take 100 mg by mouth daily. 06/25/18   [provider]  montelukast (SINGULAIR) 10 MG tablet TAKE 1 TABLET BY MOUTH EVERY DAY Patient taking differently: Take 10 mg by mouth daily.  07/02/15   Kathrine Haddock, NP  Multiple Vitamin (MULTI-VITAMINS) TABS Take 1 tablet by mouth daily.    [provider]  NIFEdipine (NIFEDICAL XL) 30 MG 24 hr tablet Take 30 mg by mouth daily. 09/30/15   [provider]  OVER THE COUNTER MEDICATION Place 1 drop into both eyes daily. Allergy eye drops    [provider]  OXYGEN Inhale 2 L into the lungs.    [provider]  predniSONE (DELTASONE) 20 MG tablet Take 1 tablet (20 mg total) by mouth daily for 5 days. 09/09/19 09/14/19  Chase Picket, MD  sitaGLIPtin (JANUVIA) 25 MG tablet Take 1 tablet by mouth daily. 01/01/18  [provider]  traZODone (DESYREL) 50 MG tablet Take 50-100 mg by mouth at bedtime as needed for sleep.     [provider]  valsartan-hydrochlorothiazide (DIOVAN-HCT) 320-25 MG tablet Take 1 tablet by mouth daily. 04/14/16   [provider]  VENTOLIN HFA 108 (90 Base) MCG/ACT inhaler INHALE 2 PUFFS INTO THE LUNGS EVERY 6 HOURS AS NEEDED Patient taking differently: Inhale 2 puffs into the lungs every 6 (six) hours as needed for wheezing or shortness of breath.  02/18/19   Kendell Bane, NP  vitamin C (ASCORBIC ACID) 500 MG tablet Take 500 mg by mouth 2 (two) times daily.    [provider]    Family History Family History  Problem Relation Age of Onset   Breast cancer Paternal Aunt 50   Diabetes Father    Heart disease Father     Social History Social History   Tobacco Use   Smoking status: Former Smoker    Packs/day: 1.00    Years: 40.00    Pack years: 40.00    Quit date: 05/12/2005    Years since quitting: 14.3   Smokeless tobacco: Never Used  Substance Use Topics   Alcohol use: No   Drug use: No      Allergies   Iodinated diagnostic agents, Latex, Phenobarbital, and Tape   Review of Systems Review of Systems  Constitutional: Positive for activity change. Negative for diaphoresis, fatigue and fever.  HENT: Negative.   Respiratory: Negative.   Cardiovascular: Negative.   Gastrointestinal: Negative.   Genitourinary: Negative.   Musculoskeletal: Positive for arthralgias and joint swelling. Negative for myalgias.  Skin: Positive for color change. Negative for rash and wound.  Neurological: Negative.      Physical Exam Triage Vital Signs ED Triage Vitals  Enc Vitals Group     BP 09/09/19 1425 116/64     Pulse Rate 09/09/19 1425 67     Resp 09/09/19 1425 18     Temp 09/09/19 1425 98.3 F (36.8 C)     Temp Source 09/09/19 1425 Oral     SpO2 09/09/19 1425 97 %     Weight 09/09/19 1426 213 lb 13.5 oz (97 kg)     Height 09/09/19 1426 5\' 8"  (1.727 m)     Head Circumference --      Peak Flow --      Pain Score 09/09/19 1426 9     Pain Loc --      Pain Edu? --      Excl. in [place]? --    No data found.  Updated Vital Signs BP 116/64 (BP Location: Left Arm)    Pulse 67    Temp 98.3 F (36.8 C) (Oral)    Resp 18    Ht 5\' 8"  (1.727 m)    Wt 97 kg    SpO2 97%    BMI 32.52 kg/m   Visual Acuity Right Eye Distance:   Left Eye Distance:   Bilateral Distance:    Right Eye Near:   Left Eye Near:    Bilateral Near:     Physical Exam Vitals signs and nursing note reviewed.  Constitutional:      General: She is in acute distress.     Appearance: She is not ill-appearing.  Cardiovascular:     Rate and Rhythm: Normal rate and regular rhythm.     Pulses: Normal pulses.     Heart sounds: Normal heart sounds.  Pulmonary:     Effort:  Pulmonary effort is normal. No respiratory distress.     Breath sounds: Normal breath sounds. No rhonchi or rales.  Abdominal:     General: Bowel sounds are normal. There is no distension.     Tenderness: There is no guarding or rebound.   Musculoskeletal:        General: Swelling, tenderness and signs of injury present. No deformity.  Skin:    Capillary Refill: Capillary refill takes less than 2 seconds.     Comments: Swelling of the right ankle.  No bruising.  Limited range of motion secondary to pain.  No rash.  Neurological:     General: No focal deficit present.     Mental Status: She is alert and oriented to person, place, and time.     Cranial Nerves: No cranial nerve deficit.     Sensory: No sensory deficit.      UC Treatments / Results  Labs (all labs ordered are listed, but only abnormal results are displayed) Labs Reviewed - No data to display  EKG   Radiology No results found.  Procedures Procedures (including critical care time)  Medications Ordered in UC Medications - No data to display  Initial Impression / Assessment and Plan / UC Course  I have reviewed the triage vital signs and the nursing notes.  Pertinent labs & imaging results that were available during my care of the patient were reviewed by me and considered in my medical decision making (see chart for details).     1.  Right ankle sprain: Slow progress is probably secondary to perpetuating inflammatory process following the ankle sprain.  Patient cannot tolerate NSAIDs.  I will therefore prescribe a short course of steroids for the patient.  She is advised to continue with elevation, icing, compression and gentle stretching exercises. Continue Tylenol as needed for pain. Final Clinical Impressions(s) / UC Diagnoses   Final diagnoses:  Sprain of right ankle, unspecified ligament, subsequent encounter   Discharge Instructions   None    ED Prescriptions    Medication Sig Dispense Auth. Provider   predniSONE (DELTASONE) 20 MG tablet Take 1 tablet (20 mg total) by mouth daily for 5 days. 5 tablet Kariya Lavergne, Myrene Galas, MD     PDMP not reviewed this encounter.   Chase Picket, MD 09/09/19 [phone]

## 2019-09-09 NOTE — ED Triage Notes (Signed)
Pt reports she was seen here X 1 week ago for sprain and right ankle. Reports continued severe pain and wants rechecked. Pt alert and oriented X4, cooperative, RR even and unlabored, color WNL. Pt in NAD. Has taken OTC tylenol without relief.

## 2019-10-24 ENCOUNTER — Telehealth: Payer: Self-pay

## 2019-10-24 NOTE — Telephone Encounter (Signed)
CMN SIGNED AND PLACED IN [employer] PAT[employer] FOLDER. °

## 2019-11-12 NOTE — Progress Notes (Signed)
[facility]  [address] [place], [place] [address] Phone: [phone]  Fax: [phone]   Telephone Visit:  11/14/2019  Referring physician: Sofie Hartigan, MD  I connected with Angelica Juarez on 11/14/2019 at 3:16 PM by telephone and verified that I was speaking with the correct person using 2 identifiers.  The patient was at home.  I discussed the limitations, risk, security and privacy concerns of performing an evaluation and management service by telephone and the availability of in person appointments.  I also discussed with the patient that there may be a patient responsible charge related to this service.  The patient expressed understanding and agreed to proceed.   Chief Complaint: Angelica Juarez is a 78 y.o. 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   HPI: The patient was last seen in the hematology clinic on 08/08/2019. At that time, she was doing well. She had underlying cardiac issues. Exam revealed no adenopathy or hepatosplenomegaly. Hematocrit 38.8 (goal </= 42), hemoglobin 13.6, platelets 272,000 (goal < 400,000), WBC 11,600 (ANC 9,400). Potassium was 3.0. The patient required no phlebotomy. She continued hydroxyurea 500 mg a day.  She began potassium 20 meq 1 tablet daily per Dr. Bethanne Ginger request.   She had a follow up with Dr. Ubaldo Glassing on 08/28/2019. She was in atrial fibrillation. She denied any orthopnea or PND. She felt fatigued and had shortness of breath with mild peripheral edema. She remained on Eliquis.   Labs on 11/13/2019 included a hematocrit 40.6, hemoglobin 14.2, MCV 96.4, platelets 326,000, WBC 10,500 (ANC 8,500). Sodium 134.  Potassium 3.3.   During the interim, she has done well. Her weight is down 4 pounds. She has shortness of breath. She uses a CPAP machine of and on, and she is in 2 liters/min of oxygen. She denies any orthopnea. Her insomnia is better.  Her potassium was 3.3. She denies any  diarrhea. She is taking oral potassium 20 meq once daily. She remains on hydrea 500 mg a day and Eliquis 5 mg BID.   Past Medical History:  Diagnosis Date  . Allergy    Seasonal  . Arthritis   . 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 Chronic kidney disease    chronic renal insufficiency  . Colon polyps 05/21/14  . COPD (chronic obstructive pulmonary disease) ([place])   . Diabetes mellitus without complication ([place])   . Dyspnea   . Fracture closed, humerus, shaft    right   . History of kidney stones    40 years ago  . Hyperlipidemia 04/30/14  . Hypertension   . Impingement syndrome of right shoulder 12/29/15  . Personal history of radiation therapy   . Pneumonia    hx  . Rotator cuff tear 04/21/16   right  . Seizures ([place])   . Sleep apnea     Past Surgical History:  Procedure Laterality Date  . ABDOMINAL HYSTERECTOMY    . 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 EYE SURGERY     bilateral cataracts  . JOINT REPLACEMENT     bilateral knee replacement  . NASAL SEPTUM SURGERY    . OOPHORECTOMY    . REPLACEMENT TOTAL KNEE Bilateral 2004  . SHOULDER ARTHROSCOPY WITH BICEPSTENOTOMY Right 05/25/2016   Procedure: SHOULDER ARTHROSCOPY WITH BICEPSTENOTOMY;  Surgeon: Leanor Kail, MD;  Location: [facility] ORS;  Service: Orthopedics;  Laterality: Right;  . SHOULDER ARTHROSCOPY WITH  DISTAL CLAVICLE RESECTION Right 05/25/2016   Procedure: SHOULDER ARTHROSCOPY WITH DISTAL CLAVICLE RESECTION;  Surgeon: Leanor Kail, MD;  Location: [facility] ORS;  Service: Orthopedics;  Laterality: Right;  . SHOULDER ARTHROSCOPY WITH OPEN ROTATOR CUFF REPAIR Right 05/25/2016   Procedure: SHOULDER ARTHROSCOPY WITH OPEN ROTATOR CUFF REPAIR;  Surgeon: Leanor Kail, MD;  Location: [facility] ORS;  Service: Orthopedics;  Laterality: Right;  . SUBACROMIAL DECOMPRESSION Right 05/25/2016   Procedure: SUBACROMIAL DECOMPRESSION;  Surgeon: Leanor Kail, MD;  Location: [facility] ORS;  Service: Orthopedics;   Laterality: Right;    Family History  Problem Relation Age of Onset  . Breast cancer Paternal Aunt 62  . Diabetes Father   . Heart disease Father     Social History:  reports that she quit smoking about 14 years ago. She has a 40.00 pack-year smoking history. She has never used smokeless tobacco. She reports that she does not drink alcohol or use drugs. Patient moved to [place] in 2001 from [place]. Patient is a former 1 ppd smoker x 40 years; quit in 2006. She is retired from [employer]. Patient denies known exposures to radiation on toxins. Her significant other is [employer]. She lives in [place]. The patient is alone today.  Participants in the patient's visit and their role in the encounter included the patient Vito Berger, CMA, today.  The intake visit was provided by Vito Berger, CMA.  Allergies:  Allergies  Allergen Reactions  . Iodinated Diagnostic Agents Anaphylaxis    Other reaction(s): Other (See Comments) Throat swells and extreme hives  . Latex Itching  . Phenobarbital Hives  . Tape Rash    silicones    Current Medications: Current Outpatient Medications  Medication Sig Dispense Refill  . albuterol (ACCUNEB) 1.25 MG/3ML nebulizer solution Take 3 mLs (1.25 mg total) by nebulization every 6 (six) hours as needed. wheezing 75 mL 3  . apixaban (ELIQUIS) 5 MG TABS tablet Take 1 tablet (5 mg total) by mouth 2 (two) times daily. 60 tablet 0  . aspirin 81 MG tablet Take 81 mg by mouth daily.    Marland Kitchen atorvastatin (LIPITOR) 40 MG tablet Take 40 mg by mouth daily.    . B Complex Vitamins (VITAMIN B COMPLEX PO) Take 1 tablet by mouth daily.    . diphenhydrAMINE (BENADRYL) 25 mg capsule Take 25 mg by mouth daily.    . fluticasone (FLONASE) 50 MCG/ACT nasal spray Place 2 sprays into both nostrils daily. 16 g 2  . Fluticasone-Umeclidin-Vilant (TRELEGY ELLIPTA) 100-62.5-25 MCG/INH AEPB Inhale 1 puff into the lungs daily. 1 each 4  . furosemide (LASIX) 20 MG tablet Take 1 tablet  (20 mg total) by mouth 2 (two) times daily. 60 tablet 0  . hydrALAZINE (APRESOLINE) 100 MG tablet Take 50 mg by mouth 2 (two) times daily.     Marland Kitchen levETIRAcetam (KEPPRA) 750 MG tablet Take 750 mg by mouth 2 (two) times daily.    . metFORMIN (GLUCOPHAGE) 500 MG tablet Take 500 mg by mouth 2 (two) times daily with a meal.    . metoprolol succinate (TOPROL-XL) 100 MG 24 hr tablet Take 100 mg by mouth daily.    . montelukast (SINGULAIR) 10 MG tablet TAKE 1 TABLET BY MOUTH EVERY DAY (Patient taking differently: Take 10 mg by mouth daily. ) 90 tablet 0  . Multiple Vitamin (MULTI-VITAMINS) TABS Take 1 tablet by mouth daily.    Marland Kitchen NIFEdipine (NIFEDICAL XL) 30 MG 24 hr tablet Take 30 mg by mouth daily.    Marland Kitchen  OVER THE COUNTER MEDICATION Place 1 drop into both eyes daily. Allergy eye drops    . OXYGEN Inhale 2 L into the lungs.    . sitaGLIPtin (JANUVIA) 25 MG tablet Take 1 tablet by mouth daily.    . traZODone (DESYREL) 50 MG tablet Take 50-100 mg by mouth at bedtime as needed for sleep.   0  . valsartan-hydrochlorothiazide (DIOVAN-HCT) 320-25 MG tablet Take 1 tablet by mouth daily.  0  . VENTOLIN HFA 108 (90 Base) MCG/ACT inhaler INHALE 2 PUFFS INTO THE LUNGS EVERY 6 HOURS AS NEEDED (Patient taking differently: Inhale 2 puffs into the lungs every 6 (six) hours as needed for wheezing or shortness of breath. ) 54 g 1  . vitamin C (ASCORBIC ACID) 500 MG tablet Take 500 mg by mouth 2 (two) times daily.    . Calcium Carbonate-Vitamin D 600-200 MG-UNIT CAPS Take 1 capsule by mouth daily.    . cloNIDine (CATAPRES) 0.2 MG tablet Take 0.2 mg by mouth 2 (two) times daily.    Marland Kitchen EPINEPHrine 0.3 mg/0.3 mL IJ SOAJ injection Inject 0.3 mg into the muscle as needed for anaphylaxis.      No current facility-administered medications for this visit.    Review of Systems  Constitutional: Positive for weight loss (4 lbs). Negative for chills, diaphoresis, fever and malaise/fatigue.       Doing well.  HENT: Negative.   Negative for congestion, hearing loss, nosebleeds, sinus pain and sore throat.   Eyes: Negative.  Negative for blurred vision, double vision and photophobia.       Macular degeneration left eye, receives injection.   Respiratory: Positive for shortness of breath. Negative for cough, hemoptysis and sputum production.        Sleep apnea - uses nocturnal CPAP. COPD on 2 liters/min of oxygen.  Cardiovascular: Negative for chest pain, palpitations, orthopnea, leg swelling and PND.       Notes "valve issues" and f/u at [facility].  Gastrointestinal: Negative.  Negative for abdominal pain, blood in stool, constipation, diarrhea, melena, nausea and vomiting.  Genitourinary: Negative.  Negative for dysuria, frequency, hematuria and urgency.  Musculoskeletal: Negative for back pain (DDD), falls, joint pain (OA in shoulders) and neck pain.  Skin: Negative.  Negative for itching and rash.  Neurological: Negative.  Negative for dizziness, tremors, sensory change, speech change, weakness and headaches.  Endo/Heme/Allergies: Positive for environmental allergies (season; recieves allergy injections every 4 weeks). Does not bruise/bleed easily.  Psychiatric/Behavioral: Negative for depression and memory loss. The patient is not nervous/anxious and does not have insomnia (difficulty sleeping; better).   All other systems reviewed and are negative.  Performance status (ECOG): 1  Physical Exam  Nursing note reviewed. Constitutional: She is oriented to person, place, and time.  Neurological: She is alert and oriented to person, place, and time.  Psychiatric: She has a normal mood and affect. Her behavior is normal. Judgment and thought content normal.     Appointment on 11/13/2019  Component Date Value Ref Range Status  . WBC 11/13/2019 10.5  4.0 - 10.5 K/uL Final  . RBC 11/13/2019 4.21  3.87 - 5.11 MIL/uL Final  . Hemoglobin 11/13/2019 14.2  12.0 - 15.0 g/dL Final  . HCT 11/13/2019 40.6  36.0 - 46.0 % Final  .  MCV 11/13/2019 96.4  80.0 - 100.0 fL Final  . MCH 11/13/2019 33.7  26.0 - 34.0 pg Final  . MCHC 11/13/2019 35.0  30.0 - 36.0 g/dL Final  . RDW 11/13/2019 13.5  11.5 - 15.5 % Final  . Platelets 11/13/2019 326  150 - 400 K/uL Final  . nRBC 11/13/2019 0.0  0.0 - 0.2 % Final  . Neutrophils Relative % 11/13/2019 80  % Final  . Neutro Abs 11/13/2019 8.5* 1.7 - 7.7 K/uL Final  . Lymphocytes Relative 11/13/2019 7  % Final  . Lymphs Abs 11/13/2019 0.7  0.7 - 4.0 K/uL Final  . Monocytes Relative 11/13/2019 8  % Final  . Monocytes Absolute 11/13/2019 0.8  0.1 - 1.0 K/uL Final  . Eosinophils Relative 11/13/2019 3  % Final  . Eosinophils Absolute 11/13/2019 0.3  0.0 - 0.5 K/uL Final  . Basophils Relative 11/13/2019 1  % Final  . Basophils Absolute 11/13/2019 0.1  0.0 - 0.1 K/uL Final  . Immature Granulocytes 11/13/2019 1  % Final  . Abs Immature Granulocytes 11/13/2019 0.06  0.00 - 0.07 K/uL Final   Performed at [facility], [address], [place], [place] [address]  . Sodium 11/13/2019 134* 135 - 145 mmol/L Final  . Potassium 11/13/2019 3.3* 3.5 - 5.1 mmol/L Final  . Chloride 11/13/2019 102  98 - 111 mmol/L Final  . CO2 11/13/2019 24  22 - 32 mmol/L Final  . Glucose, Bld 11/13/2019 226* 70 - 99 mg/dL Final  . BUN 11/13/2019 15  8 - 23 mg/dL Final  . Creatinine, Ser 11/13/2019 0.97  0.44 - 1.00 mg/dL Final  . Calcium 11/13/2019 9.3  8.9 - 10.3 mg/dL Final  . Total Protein 11/13/2019 6.6  6.5 - 8.1 g/dL Final  . Albumin 11/13/2019 3.8  3.5 - 5.0 g/dL Final  . AST 11/13/2019 28  15 - 41 U/L Final  . ALT 11/13/2019 26  0 - 44 U/L Final  . Alkaline Phosphatase 11/13/2019 83  38 - 126 U/L Final  . Total Bilirubin 11/13/2019 1.0  0.3 - 1.2 mg/dL Final  . GFR calc non Af Amer 11/13/2019 57* >60 mL/min Final  . GFR calc Af Amer 11/13/2019 >60  >60 mL/min Final  . Anion gap 11/13/2019 8  5 - 15 Final   Performed at [facility], [address] [place], [place] [address]     Assessment:  Angelica Juarez is a 78 y.o. female withpolycythemia rubra vera.She has had sleep apnea x 7 years and uses CPAP. She has a 40 pack year smoking history (stopped 13 years ago). She denies and cardiac history.  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 There was no significant dysplasia or increased blasts. Overall features were c/w polycythemia rubra vera.  Work up on 08/08/2018 revealed a WBC of 16,700 ([place] 13,800). Hemoglobin 15.3, hematocrit 45.5, MCV 87.9, and platelets 552,000. Erythropoietin level was normal at 2.9 mIU/mL. BCR/ABL demonstrated no BCR or ABL gene rearrangements. Carbon monoxide level was normal at 3.0% (0-3.6 %). 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   She began a phlebotomy programon 09/07/2018 (last04/30/2020). Hematocrit goalis <=42.  She began hydroxyurea500 mg a day on 09/12/2018.  She was diagnosed with C difficile + diarrheaon 10/10/2018; completed 10 day course of oral vancomycin. 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  Symptomatically, she is doing well.  She denies any B symptoms.  Plan: 1.   Review labs from 11/13/2019. 2.Polycythemia rubra vera Hematocrit40.6. Hemoglobin14.2.Platelets326,000 Hematocrit goal </= 42. Platelet goal< 400,000. No phlebotomy needed today. Continue hydroxyurea 500 mg a day. 3. Hypokalemia Potassium 3.3.  Etiology secondary to Lasix. Contact Dr Ubaldo Glassing or Dr Ellison Hughs- done. 4.   RTC in 3 months for MD assessment and labs (CBC with diff, CMP).  I discussed the assessment and treatment plan with the patient.  The patient was provided an opportunity to ask questions and all were answered.  The patient agreed with the plan and demonstrated an understanding of the instructions.  The patient was advised to call back if the  symptoms worsen or if the condition fails to improve as anticipated.  I provided 15 minutes (3:16 PM - 3:31 PM) of non face-to-face visit time during this this encounter and > 50% was spent counseling as documented under my assessment and plan.  I provided these services from the [facility] office.   Lequita Asal, MD, PhD    11/14/2019, 3:16 PM  I, Selena Batten, am acting as scribe for [employer]. Mike Gip, MD, PhD.  I, Briseida Gittings C. Mike Gip, MD, have reviewed the above documentation for accuracy and completeness, and I agree with the above.

## 2019-11-13 ENCOUNTER — Other Ambulatory Visit: Payer: Self-pay

## 2019-11-13 ENCOUNTER — Inpatient Hospital Stay: Payer: Medicare Other | Attending: Hematology and Oncology

## 2019-11-13 ENCOUNTER — Encounter: Payer: Self-pay | Admitting: Hematology and Oncology

## 2019-11-13 DIAGNOSIS — Z86010 Personal history of colonic polyps: Secondary | ICD-10-CM | POA: Diagnosis not present

## 2019-11-13 DIAGNOSIS — E785 Hyperlipidemia, unspecified: Secondary | ICD-10-CM | POA: Insufficient documentation

## 2019-11-13 DIAGNOSIS — J449 Chronic obstructive pulmonary disease, unspecified: Secondary | ICD-10-CM | POA: Insufficient documentation

## 2019-11-13 DIAGNOSIS — E876 Hypokalemia: Secondary | ICD-10-CM | POA: Insufficient documentation

## 2019-11-13 DIAGNOSIS — I4891 Unspecified atrial fibrillation: Secondary | ICD-10-CM | POA: Diagnosis not present

## 2019-11-13 DIAGNOSIS — Z803 Family history of malignant neoplasm of breast: Secondary | ICD-10-CM | POA: Diagnosis not present

## 2019-11-13 DIAGNOSIS — Z7984 Long term (current) use of oral hypoglycemic drugs: Secondary | ICD-10-CM | POA: Diagnosis not present

## 2019-11-13 DIAGNOSIS — Z79899 Other long term (current) drug therapy: Secondary | ICD-10-CM | POA: Diagnosis not present

## 2019-11-13 DIAGNOSIS — G4700 Insomnia, unspecified: Secondary | ICD-10-CM | POA: Insufficient documentation

## 2019-11-13 DIAGNOSIS — E119 Type 2 diabetes mellitus without complications: Secondary | ICD-10-CM | POA: Diagnosis not present

## 2019-11-13 DIAGNOSIS — Z7901 Long term (current) use of anticoagulants: Secondary | ICD-10-CM | POA: Insufficient documentation

## 2019-11-13 DIAGNOSIS — Z87442 Personal history of urinary calculi: Secondary | ICD-10-CM | POA: Insufficient documentation

## 2019-11-13 DIAGNOSIS — M129 Arthropathy, unspecified: Secondary | ICD-10-CM | POA: Diagnosis not present

## 2019-11-13 DIAGNOSIS — R609 Edema, unspecified: Secondary | ICD-10-CM | POA: Diagnosis not present

## 2019-11-13 DIAGNOSIS — I129 Hypertensive chronic kidney disease with stage 1 through stage 4 chronic kidney disease, or unspecified chronic kidney disease: Secondary | ICD-10-CM | POA: Diagnosis not present

## 2019-11-13 DIAGNOSIS — D45 Polycythemia vera: Secondary | ICD-10-CM | POA: Insufficient documentation

## 2019-11-13 DIAGNOSIS — G4730 Sleep apnea, unspecified: Secondary | ICD-10-CM | POA: Diagnosis not present

## 2019-11-13 LAB — COMPREHENSIVE METABOLIC PANEL
ALT: 26 U/L (ref 0–44)
AST: 28 U/L (ref 15–41)
Albumin: 3.8 g/dL (ref 3.5–5.0)
Alkaline Phosphatase: 83 U/L (ref 38–126)
Anion gap: 8 (ref 5–15)
BUN: 15 mg/dL (ref 8–23)
CO2: 24 mmol/L (ref 22–32)
Calcium: 9.3 mg/dL (ref 8.9–10.3)
Chloride: 102 mmol/L (ref 98–111)
Creatinine, Ser: 0.97 mg/dL (ref 0.44–1.00)
GFR calc Af Amer: >60 mL/min (ref >60)
GFR calc non Af Amer: 57 mL/min — ABNORMAL LOW (ref >60)
Glucose, Bld: 226 mg/dL — ABNORMAL HIGH (ref 70–99)
Potassium: 3.3 mmol/L — ABNORMAL LOW (ref 3.5–5.1)
Sodium: 134 mmol/L — ABNORMAL LOW (ref 135–145)
Total Bilirubin: 1 mg/dL (ref 0.3–1.2)
Total Protein: 6.6 g/dL (ref 6.5–8.1)

## 2019-11-13 LAB — CBC WITH DIFFERENTIAL/PLATELET
Abs Immature Granulocytes: 0.06 10*3/uL (ref 0.00–0.07)
Basophils Absolute: 0.1 10*3/uL (ref 0.0–0.1)
Basophils Relative: 1 %
Eosinophils Absolute: 0.3 10*3/uL (ref 0.0–0.5)
Eosinophils Relative: 3 %
HCT: 40.6 % (ref 36.0–46.0)
Hemoglobin: 14.2 g/dL (ref 12.0–15.0)
Immature Granulocytes: 1 %
Lymphocytes Relative: 7 %
Lymphs Abs: 0.7 10*3/uL (ref 0.7–4.0)
MCH: 33.7 pg (ref 26.0–34.0)
MCHC: 35 g/dL (ref 30.0–36.0)
MCV: 96.4 fL (ref 80.0–100.0)
Monocytes Absolute: 0.8 10*3/uL (ref 0.1–1.0)
Monocytes Relative: 8 %
Neutro Abs: 8.5 10*3/uL — ABNORMAL HIGH (ref 1.7–7.7)
Neutrophils Relative %: 80 %
Platelets: 326 10*3/uL (ref 150–400)
RBC: 4.21 MIL/uL (ref 3.87–5.11)
RDW: 13.5 % (ref 11.5–15.5)
WBC: 10.5 10*3/uL (ref 4.0–10.5)
nRBC: 0 % (ref 0.0–0.2)

## 2019-11-13 NOTE — Progress Notes (Signed)
No new changes noted today. The patient name and DOB has been verified by phone today. 

## 2019-11-14 ENCOUNTER — Encounter: Payer: Self-pay | Admitting: Hematology and Oncology

## 2019-11-14 ENCOUNTER — Inpatient Hospital Stay (HOSPITAL_BASED_OUTPATIENT_CLINIC_OR_DEPARTMENT_OTHER): Payer: Medicare Other | Admitting: Hematology and Oncology

## 2019-11-14 ENCOUNTER — Other Ambulatory Visit: Payer: Medicare Other

## 2019-11-14 ENCOUNTER — Telehealth: Payer: Self-pay

## 2019-11-14 DIAGNOSIS — E876 Hypokalemia: Secondary | ICD-10-CM | POA: Diagnosis not present

## 2019-11-14 DIAGNOSIS — D45 Polycythemia vera: Secondary | ICD-10-CM

## 2019-11-14 NOTE — Telephone Encounter (Signed)
Lab results has been sent over to Dr Bethanne Ginger office and also they have been informed that the patient need a refill on her potassium.

## 2019-12-19 ENCOUNTER — Other Ambulatory Visit: Payer: Self-pay

## 2019-12-19 ENCOUNTER — Telehealth: Payer: Self-pay

## 2019-12-19 ENCOUNTER — Other Ambulatory Visit: Payer: Self-pay | Admitting: Hematology and Oncology

## 2019-12-19 DIAGNOSIS — D45 Polycythemia vera: Secondary | ICD-10-CM

## 2019-12-19 NOTE — Telephone Encounter (Signed)
spoke with the patient to inform her that Hydruea has been sent to the pharmacy. The patient was understanding and agreeable.

## 2019-12-19 NOTE — Telephone Encounter (Signed)
Spoke with the patient to see if she is still taking hydroydurea 500 mg 1 tab po daily. The reports she is currently still taking it and only have 3 pills left Dr Mike Gip has been informed.

## 2019-12-20 ENCOUNTER — Inpatient Hospital Stay: Payer: Medicare Other

## 2019-12-20 ENCOUNTER — Inpatient Hospital Stay: Payer: Medicare Other | Attending: Hematology and Oncology

## 2019-12-20 DIAGNOSIS — D45 Polycythemia vera: Secondary | ICD-10-CM | POA: Diagnosis present

## 2019-12-20 LAB — CBC WITH DIFFERENTIAL/PLATELET
Abs Immature Granulocytes: 0.06 10*3/uL (ref 0.00–0.07)
Basophils Absolute: 0.1 10*3/uL (ref 0.0–0.1)
Basophils Relative: 1 %
Eosinophils Absolute: 0.4 10*3/uL (ref 0.0–0.5)
Eosinophils Relative: 4 %
HCT: 40.3 % (ref 36.0–46.0)
Hemoglobin: 14.2 g/dL (ref 12.0–15.0)
Immature Granulocytes: 1 %
Lymphocytes Relative: 7 %
Lymphs Abs: 0.8 10*3/uL (ref 0.7–4.0)
MCH: 33.8 pg (ref 26.0–34.0)
MCHC: 35.2 g/dL (ref 30.0–36.0)
MCV: 96 fL (ref 80.0–100.0)
Monocytes Absolute: 1.4 10*3/uL — ABNORMAL HIGH (ref 0.1–1.0)
Monocytes Relative: 13 %
Neutro Abs: 7.9 10*3/uL — ABNORMAL HIGH (ref 1.7–7.7)
Neutrophils Relative %: 74 %
Platelets: 337 10*3/uL (ref 150–400)
RBC: 4.2 MIL/uL (ref 3.87–5.11)
RDW: 13.9 % (ref 11.5–15.5)
WBC: 10.7 10*3/uL — ABNORMAL HIGH (ref 4.0–10.5)
nRBC: 0 % (ref 0.0–0.2)

## 2019-12-20 NOTE — Progress Notes (Signed)
No phlebotomy required today. HCT is 40.3 .

## 2019-12-24 ENCOUNTER — Ambulatory Visit (INDEPENDENT_AMBULATORY_CARE_PROVIDER_SITE_OTHER): Payer: Medicare Other | Admitting: Internal Medicine

## 2019-12-24 ENCOUNTER — Ambulatory Visit: Payer: PRIVATE HEALTH INSURANCE

## 2019-12-24 ENCOUNTER — Encounter: Payer: Self-pay | Admitting: Adult Health

## 2019-12-24 ENCOUNTER — Other Ambulatory Visit: Payer: Self-pay

## 2019-12-24 VITALS — BP 99/87 | HR 65 | Temp 94.4°F | Resp 16 | Ht 67.0 in | Wt 211.0 lb

## 2019-12-24 DIAGNOSIS — J449 Chronic obstructive pulmonary disease, unspecified: Secondary | ICD-10-CM | POA: Diagnosis not present

## 2019-12-24 DIAGNOSIS — G4733 Obstructive sleep apnea (adult) (pediatric): Secondary | ICD-10-CM | POA: Diagnosis not present

## 2019-12-24 DIAGNOSIS — R197 Diarrhea, unspecified: Secondary | ICD-10-CM

## 2019-12-24 DIAGNOSIS — J9611 Chronic respiratory failure with hypoxia: Secondary | ICD-10-CM | POA: Diagnosis not present

## 2019-12-24 DIAGNOSIS — J301 Allergic rhinitis due to pollen: Secondary | ICD-10-CM | POA: Diagnosis not present

## 2019-12-24 DIAGNOSIS — R110 Nausea: Secondary | ICD-10-CM

## 2019-12-24 DIAGNOSIS — Z9989 Dependence on other enabling machines and devices: Secondary | ICD-10-CM

## 2019-12-24 NOTE — Progress Notes (Signed)
[facility] [place], [place] [address]  Internal MEDICINE  Office Visit Note  Patient Name: Angelica Juarez  [id number]  [id number]  Date of Service: 12/24/2019  Chief Complaint  Patient presents with  . Allergies    reorder allergy injections  . COPD    issues with O2  . Nausea    feeling nausa    HPI  Pt is seen today for issues with her oxygen. She feels that her portable oxygen concentrator is making a funny noise. It is on and running in the office, and does not sound bad currently. She is also complaining of some nausea and diarrhea for three days.  She reports the diarrhea Juarez first, and then the nausea started.  She took some Pepto bismol this morning, and still has some nausea.    Current Medication: Outpatient Encounter Medications as of 12/24/2019  Medication Sig  . albuterol (ACCUNEB) 1.25 MG/3ML nebulizer solution Take 3 mLs (1.25 mg total) by nebulization every 6 (six) hours as needed. wheezing  . apixaban (ELIQUIS) 5 MG TABS tablet Take 1 tablet (5 mg total) by mouth 2 (two) times daily.  Marland Kitchen aspirin 81 MG tablet Take 81 mg by mouth daily.  Marland Kitchen atorvastatin (LIPITOR) 40 MG tablet Take 40 mg by mouth daily.  . B Complex Vitamins (VITAMIN B COMPLEX PO) Take 1 tablet by mouth daily.  . Calcium Carbonate-Vitamin D 600-200 MG-UNIT CAPS Take 1 capsule by mouth daily.  . cloNIDine (CATAPRES) 0.2 MG tablet Take 0.2 mg by mouth 2 (two) times daily.  . diphenhydrAMINE (BENADRYL) 25 mg capsule Take 25 mg by mouth daily.  Marland Kitchen EPINEPHrine 0.3 mg/0.3 mL IJ SOAJ injection Inject 0.3 mg into the muscle as needed for anaphylaxis.   . fluticasone (FLONASE) 50 MCG/ACT nasal spray Place 2 sprays into both nostrils daily.  . Fluticasone-Umeclidin-Vilant (TRELEGY ELLIPTA) 100-62.5-25 MCG/INH AEPB Inhale 1 puff into the lungs daily.  . furosemide (LASIX) 20 MG tablet Take 1 tablet (20 mg total) by mouth 2 (two) times daily.  . hydrALAZINE (APRESOLINE) 100 MG tablet Take 50  mg by mouth 2 (two) times daily.   . hydroxyurea (HYDREA) 500 MG capsule TAKE 1 CAPSULE(500 MG) BY MOUTH DAILY. MAY TAKE WITH FOOD TO MINIMIZE GI SIDE EFFECTS  . levETIRAcetam (KEPPRA) 750 MG tablet Take 750 mg by mouth 2 (two) times daily.  . metFORMIN (GLUCOPHAGE) 500 MG tablet Take 500 mg by mouth 2 (two) times daily with a meal.  . metoprolol succinate (TOPROL-XL) 100 MG 24 hr tablet Take 100 mg by mouth daily.  . montelukast (SINGULAIR) 10 MG tablet TAKE 1 TABLET BY MOUTH EVERY DAY (Patient taking differently: Take 10 mg by mouth daily. )  . Multiple Vitamin (MULTI-VITAMINS) TABS Take 1 tablet by mouth daily.  Marland Kitchen NIFEdipine (NIFEDICAL XL) 30 MG 24 hr tablet Take 30 mg by mouth daily.  Marland Kitchen OVER THE COUNTER MEDICATION Place 1 drop into both eyes daily. Allergy eye drops  . OXYGEN Inhale 2 L into the lungs.  . sitaGLIPtin (JANUVIA) 25 MG tablet Take 1 tablet by mouth daily.  . traZODone (DESYREL) 50 MG tablet Take 50-100 mg by mouth at bedtime as needed for sleep.   . valsartan-hydrochlorothiazide (DIOVAN-HCT) 320-25 MG tablet Take 1 tablet by mouth daily.  . VENTOLIN HFA 108 (90 Base) MCG/ACT inhaler INHALE 2 PUFFS INTO THE LUNGS EVERY 6 HOURS AS NEEDED (Patient taking differently: Inhale 2 puffs into the lungs every 6 (six) hours as needed  for wheezing or shortness of breath. )  . vitamin C (ASCORBIC ACID) 500 MG tablet Take 500 mg by mouth 2 (two) times daily.   No facility-administered encounter medications on file as of 12/24/2019.    Surgical History: Past Surgical History:  Procedure Laterality Date  . ABDOMINAL HYSTERECTOMY    . 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 EYE SURGERY     bilateral cataracts  . JOINT REPLACEMENT     bilateral knee replacement  . NASAL SEPTUM SURGERY    . OOPHORECTOMY    . REPLACEMENT TOTAL KNEE Bilateral 2004  . SHOULDER ARTHROSCOPY WITH BICEPSTENOTOMY Right 05/25/2016   Procedure: SHOULDER ARTHROSCOPY WITH BICEPSTENOTOMY;  Surgeon:  Leanor Kail, MD;  Location: [facility] ORS;  Service: Orthopedics;  Laterality: Right;  . SHOULDER ARTHROSCOPY WITH DISTAL CLAVICLE RESECTION Right 05/25/2016   Procedure: SHOULDER ARTHROSCOPY WITH DISTAL CLAVICLE RESECTION;  Surgeon: Leanor Kail, MD;  Location: [facility] OR[facility];  Service: Orthopedics;  Laterality: Right;  . SHOULDER ARTHROSCOPY WITH OPEN ROTATOR CUFF REPAIR Right 05/25/2016   Procedure: SHOULDER ARTHROSCOPY WITH OPEN ROTATOR CUFF REPAIR;  Surgeon: Leanor Kail, MD;  Location: [facility] ORS;  Service: Orthopedics;  Laterality: Right;  . SUBACROMIAL DECOMPRESSION Right 05/25/2016   Procedure: SUBACROMIAL DECOMPRESSION;  Surgeon: Leanor Kail, MD;  Location: [facility] ORS;  Service: Orthopedics;  Laterality: Right;    Medical History: Past Medical History:  Diagnosis Date  . Allergy    Seasonal  . Arthritis   . 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 Chronic kidney disease    chronic renal insufficiency  . Colon polyps 05/21/14  . COPD (chronic obstructive pulmonary disease) ([place])   . Diabetes mellitus without complication ([place])   . Dyspnea   . Fracture closed, humerus, shaft    right   . History of kidney stones    40 years ago  . Hyperlipidemia 04/30/14  . Hypertension   . Impingement syndrome of right shoulder 12/29/15  . Personal history of radiation therapy   . Pneumonia    hx  . Rotator cuff tear 04/21/16   right  . Seizures ([place])   . Sleep apnea     Family History: Family History  Problem Relation Age of Onset  . Breast cancer Paternal Aunt 48  . Diabetes Father   . Heart disease Father     Social History   Socioeconomic History  . Marital status: Single    Spouse name: Not on file  . Number of children: Not on file  . Years of education: Not on file  . Highest education level: Not on file  Occupational History  . Not on file  Tobacco Use  . Smoking status: Former Smoker    Packs/day: 1.00    Years: 40.00    Pack years: 40.00    Quit date:  05/12/2005    Years since quitting: 14.6  . Smokeless tobacco: Never Used  Substance and Sexual Activity  . Alcohol use: No  . Drug use: No  . Sexual activity: Not on file  Other Topics Concern  . Not on file  Social History Narrative  . Not on file   Social Determinants of Health   Financial Resource Strain:   . Difficulty of Paying Living Expenses: Not on file  Food Insecurity:   . Worried About Charity fundraiser in the Last Year: Not on file  . Ran Out of Food in the Last Year: Not on file  Transportation Needs:   . Film/video editor (Medical): Not on file  . Lack of Transportation (Non-Medical): Not on file  Physical Activity:   . Days of Exercise per Week: Not on file  . Minutes of Exercise per Session: Not on file  Stress:   . Feeling of Stress : Not on file  Social Connections:   . Frequency of Communication with Friends and Family: Not on file  . Frequency of Social Gatherings with Friends and Family: Not on file  . Attends Religious Services: Not on file  . Active Member of Clubs or Organizations: Not on file  . Attends Archivist Meetings: Not on file  . Marital Status: Not on file  Intimate Partner Violence:   . Fear of Current or Ex-Partner: Not on file  . Emotionally Abused: Not on file  . Physically Abused: Not on file  . Sexually Abused: Not on file      Review of Systems  Constitutional: Negative for chills, fatigue and unexpected weight change.  HENT: Negative for congestion, rhinorrhea, sneezing and sore throat.   Eyes: Negative for photophobia, pain and redness.  Respiratory: Negative for cough, chest tightness and shortness of breath.   Cardiovascular: Negative for chest pain and palpitations.  Gastrointestinal: Negative for abdominal pain, constipation, diarrhea, nausea and vomiting.  Endocrine: Negative.   Genitourinary: Negative for dysuria and frequency.  Musculoskeletal: Negative for arthralgias, back pain, joint swelling  and neck pain.  Skin: Negative for rash.  Allergic/Immunologic: Negative.   Neurological: Negative for tremors and numbness.  Hematological: Negative for adenopathy. Does not bruise/bleed easily.  Psychiatric/Behavioral: Negative for behavioral problems and sleep disturbance. The patient is not nervous/anxious.     Vital Signs: BP 99/87   Pulse 65   Temp (!) 94.4 F (34.7 C)   Resp 16   Ht 5\' 7"  (1.702 m)   Wt 211 lb (95.7 kg)   SpO2 98%   BMI 33.05 kg/m    Physical Exam Vitals and nursing note reviewed.  Constitutional:      General: She is not in acute distress.    Appearance: She is well-developed. She is not diaphoretic.  HENT:     Head: Normocephalic and atraumatic.     Mouth/Throat:     Pharynx: No oropharyngeal exudate.  Eyes:     Pupils: Pupils are equal, round, and reactive to light.  Neck:     Thyroid: No thyromegaly.     Vascular: No JVD.     Trachea: No tracheal deviation.  Cardiovascular:     Rate and Rhythm: Normal rate and regular rhythm.     Heart sounds: Normal heart sounds. No murmur. No friction rub. No gallop.   Pulmonary:     Effort: Pulmonary effort is normal. No respiratory distress.     Breath sounds: Normal breath sounds. No wheezing or rales.  Chest:     Chest wall: No tenderness.  Abdominal:     Palpations: Abdomen is soft.     Tenderness: There is no abdominal tenderness. There is no guarding.  Musculoskeletal:        General: Normal range of motion.     Cervical back: Normal range of motion and neck supple.  Lymphadenopathy:     Cervical: No cervical adenopathy.  Skin:    General: Skin is warm and dry.  Neurological:     Mental Status: She is alert and oriented to person, place, and time.     Cranial Nerves: No cranial nerve  deficit.  Psychiatric:        Behavior: Behavior normal.        Thought Content: Thought content normal.        Judgment: Judgment normal.    Assessment/Plan: 1. OSA on CPAP Continue to wear cpap as  prescribed.  2. Chronic obstructive pulmonary disease, unspecified COPD type (Home) Stable, continue nebulizer and oxygen as directed.   3. Chronic respiratory failure with hypoxia (HCC) Continue oxygen and supportive care.   4. Seasonal allergic rhinitis due to pollen Worsening symptoms since stopping allergy injections.  She will need new RX and to restart  5. Nausea without vomiting Use zofran as directed.  6. Diarrhea, unspecified type Drink plenty of fluids  General Counseling: Nickolette verbalizes understanding of the findings of todays visit and agrees with plan of treatment. I have discussed any further diagnostic evaluation that may be needed or ordered today. We also reviewed her medications today. she has been encouraged to call the office with any questions or concerns that should arise related to todays visit.    No orders of the defined types were placed in this encounter.   No orders of the defined types were placed in this encounter.   Time spent: 25 Minutes   This patient Juarez seen by Orson Gear AGNP-C in Collaboration with Dr Lavera Guise as a part of collaborative care agreement     Kendell Bane AGNP-C Internal medicine

## 2020-01-15 DIAGNOSIS — R109 Unspecified abdominal pain: Secondary | ICD-10-CM | POA: Insufficient documentation

## 2020-01-16 ENCOUNTER — Other Ambulatory Visit: Payer: Self-pay | Admitting: Gerontology

## 2020-01-16 ENCOUNTER — Other Ambulatory Visit (HOSPITAL_COMMUNITY): Payer: Self-pay | Admitting: Gerontology

## 2020-01-16 ENCOUNTER — Ambulatory Visit
Admission: RE | Admit: 2020-01-16 | Discharge: 2020-01-16 | Disposition: A | Discharge status: Home / Self Care | Payer: Medicare Other | Source: Ambulatory Visit | Attending: Gerontology | Admitting: Gerontology

## 2020-01-16 ENCOUNTER — Other Ambulatory Visit: Payer: Self-pay

## 2020-01-16 DIAGNOSIS — R1084 Generalized abdominal pain: Secondary | ICD-10-CM | POA: Diagnosis present

## 2020-01-16 DIAGNOSIS — R319 Hematuria, unspecified: Secondary | ICD-10-CM

## 2020-01-23 ENCOUNTER — Encounter: Payer: Self-pay | Admitting: Emergency Medicine

## 2020-01-23 ENCOUNTER — Other Ambulatory Visit: Payer: Self-pay

## 2020-01-23 ENCOUNTER — Emergency Department
Admission: EM | Admit: 2020-01-23 | Discharge: 2020-01-23 | Disposition: A | Discharge status: Home / Self Care | Payer: Medicare Other | Attending: Emergency Medicine | Admitting: Emergency Medicine

## 2020-01-23 DIAGNOSIS — Z5321 Procedure and treatment not carried out due to patient leaving prior to being seen by health care provider: Secondary | ICD-10-CM | POA: Insufficient documentation

## 2020-01-23 DIAGNOSIS — R040 Epistaxis: Secondary | ICD-10-CM | POA: Insufficient documentation

## 2020-01-23 LAB — COMPREHENSIVE METABOLIC PANEL
ALT: 28 U/L (ref 0–44)
AST: 29 U/L (ref 15–41)
Albumin: 3.7 g/dL (ref 3.5–5.0)
Alkaline Phosphatase: 105 U/L (ref 38–126)
Anion gap: 8 (ref 5–15)
BUN: 13 mg/dL (ref 8–23)
CO2: 25 mmol/L (ref 22–32)
Calcium: 9.3 mg/dL (ref 8.9–10.3)
Chloride: 107 mmol/L (ref 98–111)
Creatinine, Ser: 0.67 mg/dL (ref 0.44–1.00)
GFR calc Af Amer: >60 mL/min (ref >60)
GFR calc non Af Amer: >60 mL/min (ref >60)
Glucose, Bld: 131 mg/dL — ABNORMAL HIGH (ref 70–99)
Potassium: 3.9 mmol/L (ref 3.5–5.1)
Sodium: 140 mmol/L (ref 135–145)
Total Bilirubin: 1 mg/dL (ref 0.3–1.2)
Total Protein: 6.6 g/dL (ref 6.5–8.1)

## 2020-01-23 LAB — CBC
HCT: 35.7 % — ABNORMAL LOW (ref 36.0–46.0)
Hemoglobin: 12.2 g/dL (ref 12.0–15.0)
MCH: 33.4 pg (ref 26.0–34.0)
MCHC: 34.2 g/dL (ref 30.0–36.0)
MCV: 97.8 fL (ref 80.0–100.0)
Platelets: 384 10*3/uL (ref 150–400)
RBC: 3.65 MIL/uL — ABNORMAL LOW (ref 3.87–5.11)
RDW: 14.1 % (ref 11.5–15.5)
WBC: 11.7 10*3/uL — ABNORMAL HIGH (ref 4.0–10.5)
nRBC: 0 % (ref 0.0–0.2)

## 2020-01-23 MED ORDER — OXYMETAZOLINE HCL 0.05 % NA SOLN
1.0000 | Freq: Once | NASAL | Status: AC
  Administered 2020-01-23: 1 via NASAL
  Filled 2020-01-23: qty 30

## 2020-01-23 NOTE — ED Notes (Signed)
Afrin given. Able to keep the nasal clamp off as she was no longer bleeding. Advised pt not to blow her nose or clear her throat too aggressively. And if she started bleeding again to inform the first nurse.

## 2020-01-23 NOTE — ED Triage Notes (Signed)
Nosebleed since 8:30pm. Is on eliquis. Clamp in place.

## 2020-02-04 ENCOUNTER — Telehealth: Payer: Self-pay

## 2020-02-04 NOTE — Telephone Encounter (Signed)
Confirmed appointment on 02/06/2020 and screened for covid. klh  °

## 2020-02-06 ENCOUNTER — Ambulatory Visit (INDEPENDENT_AMBULATORY_CARE_PROVIDER_SITE_OTHER): Payer: Medicare Other | Admitting: Internal Medicine

## 2020-02-06 ENCOUNTER — Encounter: Payer: Self-pay | Admitting: Internal Medicine

## 2020-02-06 ENCOUNTER — Other Ambulatory Visit: Payer: Self-pay

## 2020-02-06 VITALS — BP 163/79 | HR 99 | Temp 96.5°F | Ht 67.0 in | Wt 219.8 lb

## 2020-02-06 DIAGNOSIS — G4733 Obstructive sleep apnea (adult) (pediatric): Secondary | ICD-10-CM | POA: Diagnosis not present

## 2020-02-06 DIAGNOSIS — J449 Chronic obstructive pulmonary disease, unspecified: Secondary | ICD-10-CM | POA: Diagnosis not present

## 2020-02-06 DIAGNOSIS — R0602 Shortness of breath: Secondary | ICD-10-CM | POA: Diagnosis not present

## 2020-02-06 DIAGNOSIS — J301 Allergic rhinitis due to pollen: Secondary | ICD-10-CM

## 2020-02-06 DIAGNOSIS — Z9989 Dependence on other enabling machines and devices: Secondary | ICD-10-CM

## 2020-02-06 MED ORDER — ANORO ELLIPTA 62.5-25 MCG/INH IN AEPB: 1.0000 | INHALATION_SPRAY | Freq: Every day | RESPIRATORY_TRACT | 3 refills | Status: AC

## 2020-02-06 NOTE — Progress Notes (Signed)
[facility] [place], [place] [address]  Pulmonary Sleep Medicine   Office Visit Note  Patient Name: Angelica Juarez DOB: 1941-10-01 MRN [id number]  Date of Service: 02/06/2020  Complaints/HPI: Patient is here for routine pulmonary follow-up. She currently wears 2LPM via [place] at all times during the day for chronic respiratory failure with hypoxia. She feels that this is maintaining her at this time, will increase to 3LPM during long periods of ambulation. She has been bleeding 2LPM through her CPAP machine and is having issues with comfort and is not able to wear this new machine. This is new for her as she had CPAP for OSA without supplemental oxygen in the past and had no issues with that machine in the past. With this new machine with supplemental oxygen she is only wearing it a few hours at night without getting any sleep. There are many nights she doesn't sleep or only gets 3-4 hours a night. Has been on Trellegy inhaler for COPD, does not like this inhaler. She feels it is not controlling her symptoms, she would like to go back to [facility] as she was on this previously. Today her FEV1 on spirometry is 1.5. She reports her shortness of breath has been stable, denies chest pain or palpitations. Would like to discuss restarting her allergy injections at next visit.  ROS  General: (-) fever, (-) chills, (-) night sweats, (-) weakness Skin: (-) rashes, (-) itching,. Eyes: (-) visual changes, (-) redness, (-) itching. Nose and Sinuses: (-) nasal stuffiness or itchiness, (-) postnasal drip, (-) nosebleeds, (-) sinus trouble. Mouth and Throat: (-) sore throat, (-) hoarseness. Neck: (-) swollen glands, (-) enlarged thyroid, (-) neck pain. Respiratory: - cough, (-) bloody sputum, + shortness of breath, - wheezing. Cardiovascular: - ankle swelling, (-) chest pain. Lymphatic: (-) lymph node enlargement. Neurologic: (-) numbness, (-) tingling. Psychiatric: (-) anxiety, (-)  depression   Current Medication: Outpatient Encounter Medications as of 02/06/2020  Medication Sig  . albuterol (ACCUNEB) 1.25 MG/3ML nebulizer solution Take 3 mLs (1.25 mg total) by nebulization every 6 (six) hours as needed. wheezing  . apixaban (ELIQUIS) 5 MG TABS tablet Take 1 tablet (5 mg total) by mouth 2 (two) times daily.  Marland Kitchen aspirin 81 MG tablet Take 81 mg by mouth daily.  Marland Kitchen atorvastatin (LIPITOR) 40 MG tablet Take 40 mg by mouth daily.  . B Complex Vitamins (VITAMIN B COMPLEX PO) Take 1 tablet by mouth daily.  . diphenhydrAMINE (BENADRYL) 25 mg capsule Take 25 mg by mouth daily.  Marland Kitchen EPINEPHrine 0.3 mg/0.3 mL IJ SOAJ injection Inject 0.3 mg into the muscle as needed for anaphylaxis.   . fluticasone (FLONASE) 50 MCG/ACT nasal spray Place 2 sprays into both nostrils daily.  . Fluticasone-Umeclidin-Vilant (TRELEGY ELLIPTA) 100-62.5-25 MCG/INH AEPB Inhale 1 puff into the lungs daily.  . furosemide (LASIX) 20 MG tablet Take 1 tablet (20 mg total) by mouth 2 (two) times daily.  . hydrALAZINE (APRESOLINE) 100 MG tablet Take 50 mg by mouth 2 (two) times daily.   . hydroxyurea (HYDREA) 500 MG capsule TAKE 1 CAPSULE(500 MG) BY MOUTH DAILY. MAY TAKE WITH FOOD TO MINIMIZE GI SIDE EFFECTS  . levETIRAcetam (KEPPRA) 750 MG tablet Take 750 mg by mouth 2 (two) times daily.  . metFORMIN (GLUCOPHAGE) 500 MG tablet Take 500 mg by mouth 2 (two) times daily with a meal.  . montelukast (SINGULAIR) 10 MG tablet TAKE 1 TABLET BY MOUTH EVERY DAY (Patient taking differently: Take 10 mg  by mouth daily. )  . Multiple Vitamin (MULTI-VITAMINS) TABS Take 1 tablet by mouth daily.  Marland Kitchen NIFEdipine (NIFEDICAL XL) 30 MG 24 hr tablet Take 30 mg by mouth daily.  Marland Kitchen OVER THE COUNTER MEDICATION Place 1 drop into both eyes daily. Allergy eye drops  . OXYGEN Inhale 2 L into the lungs.  . sitaGLIPtin (JANUVIA) 25 MG tablet Take 1 tablet by mouth daily.  . valsartan-hydrochlorothiazide (DIOVAN-HCT) 320-25 MG tablet Take 1 tablet by  mouth daily.  . VENTOLIN HFA 108 (90 Base) MCG/ACT inhaler INHALE 2 PUFFS INTO THE LUNGS EVERY 6 HOURS AS NEEDED (Patient taking differently: Inhale 2 puffs into the lungs every 6 (six) hours as needed for wheezing or shortness of breath. )  . vitamin C (ASCORBIC ACID) 500 MG tablet Take 500 mg by mouth 2 (two) times daily.  . [DISCONTINUED] Calcium Carbonate-Vitamin D 600-200 MG-UNIT CAPS Take 1 capsule by mouth daily.  . [DISCONTINUED] cloNIDine (CATAPRES) 0.2 MG tablet Take 0.2 mg by mouth 2 (two) times daily.  . [DISCONTINUED] metoprolol succinate (TOPROL-XL) 100 MG 24 hr tablet Take 100 mg by mouth daily.  . [DISCONTINUED] traZODone (DESYREL) 50 MG tablet Take 50-100 mg by mouth at bedtime as needed for sleep.    No facility-administered encounter medications on file as of 02/06/2020.    Surgical History: Past Surgical History:  Procedure Laterality Date  . ABDOMINAL HYSTERECTOMY    . 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 EYE SURGERY     bilateral cataracts  . JOINT REPLACEMENT     bilateral knee replacement  . NASAL SEPTUM SURGERY    . OOPHORECTOMY    . REPLACEMENT TOTAL KNEE Bilateral 2004  . SHOULDER ARTHROSCOPY WITH BICEPSTENOTOMY Right 05/25/2016   Procedure: SHOULDER ARTHROSCOPY WITH BICEPSTENOTOMY;  Surgeon: Leanor Kail, MD;  Location: [facility] ORS;  Service: Orthopedics;  Laterality: Right;  . SHOULDER ARTHROSCOPY WITH DISTAL CLAVICLE RESECTION Right 05/25/2016   Procedure: SHOULDER ARTHROSCOPY WITH DISTAL CLAVICLE RESECTION;  Surgeon: Leanor Kail, MD;  Location: [facility] ORS;  Service: Orthopedics;  Laterality: Right;  . SHOULDER ARTHROSCOPY WITH OPEN ROTATOR CUFF REPAIR Right 05/25/2016   Procedure: SHOULDER ARTHROSCOPY WITH OPEN ROTATOR CUFF REPAIR;  Surgeon: Leanor Kail, MD;  Location: [facility] ORS;  Service: Orthopedics;  Laterality: Right;  . SUBACROMIAL DECOMPRESSION Right 05/25/2016   Procedure: SUBACROMIAL DECOMPRESSION;  Surgeon: Leanor Kail, MD;   Location: [facility] ORS;  Service: Orthopedics;  Laterality: Right;    Medical History: Past Medical History:  Diagnosis Date  . Allergy    Seasonal  . Arthritis   . 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 Chronic kidney disease    chronic renal insufficiency  . Colon polyps 05/21/14  . COPD (chronic obstructive pulmonary disease) ([place])   . Diabetes mellitus without complication ([place])   . Dyspnea   . Fracture closed, humerus, shaft    right   . History of kidney stones    40 years ago  . Hyperlipidemia 04/30/14  . Hypertension   . Impingement syndrome of right shoulder 12/29/15  . Personal history of radiation therapy   . Pneumonia    hx  . Rotator cuff tear 04/21/16   right  . Seizures ([place])   . Sleep apnea     Family History: Family History  Problem Relation Age of Onset  . Breast cancer Paternal Aunt 7  . Diabetes Father   . Heart disease Father     Social History: Social  History   Socioeconomic History  . Marital status: Single    Spouse name: Not on file  . Number of children: Not on file  . Years of education: Not on file  . Highest education level: Not on file  Occupational History  . Not on file  Tobacco Use  . Smoking status: Former Smoker    Packs/day: 1.00    Years: 40.00    Pack years: 40.00    Quit date: 05/12/2005    Years since quitting: 14.7  . Smokeless tobacco: Never Used  Substance and Sexual Activity  . Alcohol use: No  . Drug use: No  . Sexual activity: Not on file  Other Topics Concern  . Not on file  Social History Narrative  . Not on file   Social Determinants of Health   Financial Resource Strain:   . Difficulty of Paying Living Expenses:   Food Insecurity:   . Worried About Charity fundraiser in the Last Year:   . Arboriculturist in the Last Year:   Transportation Needs:   . Film/video editor (Medical):   Marland Kitchen Lack of Transportation (Non-Medical):   Physical Activity:   . Days of Exercise per Week:   . Minutes of  Exercise per Session:   Stress:   . Feeling of Stress :   Social Connections:   . Frequency of Communication with Friends and Family:   . Frequency of Social Gatherings with Friends and Family:   . Attends Religious Services:   . Active Member of Clubs or Organizations:   . Attends Archivist Meetings:   Marland Kitchen Marital Status:   Intimate Partner Violence:   . Fear of Current or Ex-Partner:   . Emotionally Abused:   Marland Kitchen Physically Abused:   . Sexually Abused:     Vital Signs: Blood pressure (!) 163/79, pulse 99, temperature (!) 96.5 F (35.8 C), height 5\' 7"  (1.702 m), weight 219 lb 12.8 oz (99.7 kg), SpO2 94 %.  Examination: General Appearance: The patient is well-developed, well-nourished, and in no distress. Skin: Gross inspection of skin unremarkable. Head: normocephalic, no gross deformities. Eyes: no gross deformities noted. ENT: ears appear grossly normal no exudates. Neck: Supple. No thyromegaly. No LAD. Respiratory: Diminished lung sounds bilaterally. Cardiovascular: Normal S1 and S2 without murmur or rub. Extremities: No cyanosis. pulses are equal. Neurologic: Alert and oriented. No involuntary movements.  LABS: Recent Results (from the past 2160 hour(s))  CBC with Differential/Platelet     Status: Abnormal   Collection Time: 11/13/19  9:15 AM  Result Value Ref Range   WBC 10.5 4.0 - 10.5 K/uL   RBC 4.21 3.87 - 5.11 MIL/uL   Hemoglobin 14.2 12.0 - 15.0 g/dL   HCT 40.6 36.0 - 46.0 %   MCV 96.4 80.0 - 100.0 fL   MCH 33.7 26.0 - 34.0 pg   MCHC 35.0 30.0 - 36.0 g/dL   RDW 13.5 11.5 - 15.5 %   Platelets 326 150 - 400 K/uL   nRBC 0.0 0.0 - 0.2 %   Neutrophils Relative % 80 %   Neutro Abs 8.5 (H) 1.7 - 7.7 K/uL   Lymphocytes Relative 7 %   Lymphs Abs 0.7 0.7 - 4.0 K/uL   Monocytes Relative 8 %   Monocytes Absolute 0.8 0.1 - 1.0 K/uL   Eosinophils Relative 3 %   Eosinophils Absolute 0.3 0.0 - 0.5 K/uL   Basophils Relative 1 %   Basophils Absolute 0.1 0.0 -  0.1 K/uL   Immature Granulocytes 1 %   Abs Immature Granulocytes 0.06 0.00 - 0.07 K/uL    Comment: Performed at [facility], [address], [place], [place] [address]  Comprehensive metabolic panel     Status: Abnormal   Collection Time: 11/13/19  9:15 AM  Result Value Ref Range   Sodium 134 (L) 135 - 145 mmol/L   Potassium 3.3 (L) 3.5 - 5.1 mmol/L   Chloride 102 98 - 111 mmol/L   CO2 24 22 - 32 mmol/L   Glucose, Bld 226 (H) 70 - 99 mg/dL   BUN 15 8 - 23 mg/dL   Creatinine, Ser 0.97 0.44 - 1.00 mg/dL   Calcium 9.3 8.9 - 10.3 mg/dL   Total Protein 6.6 6.5 - 8.1 g/dL   Albumin 3.8 3.5 - 5.0 g/dL   AST 28 15 - 41 U/L   ALT 26 0 - 44 U/L   Alkaline Phosphatase 83 38 - 126 U/L   Total Bilirubin 1.0 0.3 - 1.2 mg/dL   GFR calc non Af Amer 57 (L) >60 mL/min   GFR calc Af Amer >60 >60 mL/min   Anion gap 8 5 - 15    Comment: Performed at [facility], [address], [place], [place] [address]  CBC with Differential/Platelet     Status: Abnormal   Collection Time: 12/20/19 10:38 AM  Result Value Ref Range   WBC 10.7 (H) 4.0 - 10.5 K/uL   RBC 4.20 3.87 - 5.11 MIL/uL   Hemoglobin 14.2 12.0 - 15.0 g/dL   HCT 40.3 36.0 - 46.0 %   MCV 96.0 80.0 - 100.0 fL   MCH 33.8 26.0 - 34.0 pg   MCHC 35.2 30.0 - 36.0 g/dL   RDW 13.9 11.5 - 15.5 %   Platelets 337 150 - 400 K/uL   nRBC 0.0 0.0 - 0.2 %   Neutrophils Relative % 74 %   Neutro Abs 7.9 (H) 1.7 - 7.7 K/uL   Lymphocytes Relative 7 %   Lymphs Abs 0.8 0.7 - 4.0 K/uL   Monocytes Relative 13 %   Monocytes Absolute 1.4 (H) 0.1 - 1.0 K/uL   Eosinophils Relative 4 %   Eosinophils Absolute 0.4 0.0 - 0.5 K/uL   Basophils Relative 1 %   Basophils Absolute 0.1 0.0 - 0.1 K/uL   Immature Granulocytes 1 %   Abs Immature Granulocytes 0.06 0.00 - 0.07 K/uL    Comment: Performed at [facility], [address], [place], [place] [address]  CBC     Status: Abnormal   Collection Time: 01/23/20 10:46 PM  Result  Value Ref Range   WBC 11.7 (H) 4.0 - 10.5 K/uL   RBC 3.65 (L) 3.87 - 5.11 MIL/uL   Hemoglobin 12.2 12.0 - 15.0 g/dL   HCT 35.7 (L) 36.0 - 46.0 %   MCV 97.8 80.0 - 100.0 fL   MCH 33.4 26.0 - 34.0 pg   MCHC 34.2 30.0 - 36.0 g/dL   RDW 14.1 11.5 - 15.5 %   Platelets 384 150 - 400 K/uL   nRBC 0.0 0.0 - 0.2 %    Comment: Performed at [facility], [address] [place], [place] [address]  Comprehensive metabolic panel     Status: Abnormal   Collection Time: 01/23/20 10:46 PM  Result Value Ref Range   Sodium 140 135 - 145 mmol/L   Potassium 3.9 3.5 - 5.1 mmol/L   Chloride 107 98 -  111 mmol/L   CO2 25 22 - 32 mmol/L   Glucose, Bld 131 (H) 70 - 99 mg/dL    Comment: Glucose reference range applies only to samples taken after fasting for at least 8 hours.   BUN 13 8 - 23 mg/dL   Creatinine, Ser 0.67 0.44 - 1.00 mg/dL   Calcium 9.3 8.9 - 10.3 mg/dL   Total Protein 6.6 6.5 - 8.1 g/dL   Albumin 3.7 3.5 - 5.0 g/dL   AST 29 15 - 41 U/L   ALT 28 0 - 44 U/L   Alkaline Phosphatase 105 38 - 126 U/L   Total Bilirubin 1.0 0.3 - 1.2 mg/dL   GFR calc non Af Amer >60 >60 mL/min   GFR calc Af Amer >60 >60 mL/min   Anion gap 8 5 - 15    Comment: Performed at [facility], [address] [place], [place] [address]    Radiology: No results found.  No results found.  CT ABDOMEN PELVIS WO CONTRAST  Result Date: 01/16/2020 CLINICAL DATA:  Generalized abdominal pain. Left flank pain and hematuria. Worsening symptoms over the past 4 days. EXAM: CT ABDOMEN AND PELVIS WITHOUT CONTRAST TECHNIQUE: Multidetector CT imaging of the abdomen and pelvis was performed following the standard protocol without IV contrast. COMPARISON:  None. FINDINGS: Lower chest: Small left and trace right pleural effusion. Associated left lower lobe patchy opacity, favor atelectasis. Upper normal heart size with coronary artery calcifications. Hepatobiliary: Nodular hepatic contours suggesting cirrhosis. Possible  hyperdense lesion in the right lobe of the liver measuring 10 mm, series 2, image 26. Gallbladder appears present, no calcified gallstone. No biliary dilatation. There are surgical clips adjacent to the gallbladder fundus. Pancreas: No ductal dilatation or inflammation. Spleen: Mildly enlarged spanning 14 cm cranial caudal. Capsular calcifications peripherally. No evidence of focal lesion on noncontrast exam. Adrenals/Urinary Tract: Left adrenal thickening with possible myelolipoma. The right adrenal gland is normal. Hydronephrosis. No evidence of renal calculi. Both ureters are decompressed without stones along the course. Simple cyst in the upper left kidney measures 4.7 cm. Streak artifact from retroperitoneal surgical clips partially obscures detailed assessment. Urinary bladder is partially distended. No bladder wall thickening. No bladder stone. Stomach/Bowel: Stomach is nondistended. 10 mm duodenal lipoma, incidental. Normal positioning of the ligament of Treitz. No small bowel obstruction or inflammation. Low lying cecum in the mid pelvis. The appendix is not confidently visualized, no evidence of appendicitis. Moderate volume of stool in the proximal colon. Distal descending and sigmoid diverticulosis. No acute diverticulitis. Vascular/Lymphatic: Moderate to advanced aortic atherosclerosis. Moderate branch atherosclerosis. Retroperitoneal and pelvic surgical clips suggest prior lymph node dissection. No evidence of abdominopelvic adenopathy. Reproductive: Status post hysterectomy. No adnexal masses. Other: Free air or free fluid. Postsurgical changes the anterior abdominal wall. No body wall hernia. Musculoskeletal: Multilevel degenerative change in the spine. There are no acute or suspicious osseous abnormalities. IMPRESSION: 1. No renal stones or obstructive uropathy. No acute findings in the abdomen/pelvis or explanation for abdominal pain. 2. Simple cyst in the upper left kidney, no suspicious lesion or  explanation for hematuria on noncontrast exam. Consider contrast-enhanced exam for further evaluation of hematuria based on clinical concern. 3. Nodular hepatic contours suggesting cirrhosis. Possible 10 mm hyperdense lesion in the right lobe of the liver. Recommend nonemergent characterization with hepatic protocol MRI. 4. Mild splenomegaly. 5. Small left and trace right pleural effusions. 6. Distal colonic diverticulosis without diverticulitis. Aortic Atherosclerosis (ICD10-I70.0). Electronically Signed   By: Aurther Loft.D.  On: 01/16/2020 16:59      Assessment and Plan: Patient Active Problem List   Diagnosis Date Noted  . Hypokalemia 11/14/2019  . C. difficile diarrhea 11/24/2018  . Diarrhea 11/18/2018  . Nausea without vomiting 11/18/2018  . Goals of care, counseling/discussion 09/12/2018  . 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 History of repair of right rotator cuff 08/25/2016  . Acute respiratory failure with hypoxia ([place]) 08/06/2016  . Nontraumatic tear of right rotator cuff 04/21/2016  . Atypical chest pain 04/07/2016  . Impingement syndrome of right shoulder 12/29/2015  . Primary osteoarthritis of first carpometacarpal joint of left hand 10/27/2015  . Right shoulder pain 10/27/2015  . OSA on CPAP 10/14/2015  . COPD with asthma ([place]) 07/13/2015  . Type 2 diabetes mellitus with microalbuminuria, without long-term current use of insulin ([place]) 07/13/2015  . Essential hypertension 03/05/2015  . Difficulty sleeping 11/26/2014  . CRI (chronic renal insufficiency) 07/31/2014  . Seizure disorder ([place]) 07/31/2014  . History of colonic polyps 05/21/2014  . Hyperlipidemia, unspecified 04/30/2014    1. Chronic obstructive pulmonary disease, unspecified COPD type ([place]) Would like to go back to using Anoro inhaler instead of Trellegy at this time for symptom management. Stop Trellegy now and start Anoro, will monitor symptom management. - umeclidinium-vilanterol (ANORO  ELLIPTA) 62.5-25 MCG/INH AEPB; Inhale 1 puff into the lungs daily.  Dispense: 180 each; Refill: 3  2. SOB (shortness of breath) FEV1 1.5 today, has remained stable over time. Continue with 2LPM [place] supplemental oxygen at this time. Continue to monitor. - Spirometry with Graph  3. OSA on CPAP Non-compliance with new machine with supplemental oxygen bleeding through. New tubing and air pressures are uncomfortable and prevent her from sleeping. Will continue to work with pressure settings and adjust as needed for comfort. Encouraged compliance, will follow-up and monitor compliance as settings are adjusted.  4. Seasonal allergic rhinitis due to pollen Stable at this time on current therapy. Would like to discuss restarting her allergy injections, last injection was around Thanksgiving.  General Counseling: I have discussed the findings of the evaluation and examination with Arbie Cookey.  I have also discussed any further diagnostic evaluation thatmay be needed or ordered today. Jaelah verbalizes understanding of the findings of todays visit. We also reviewed her medications today and discussed drug interactions and side effects including but not limited excessive drowsiness and altered mental states. We also discussed that there is always a risk not just to her but also people around her. she has been encouraged to call the office with any questions or concerns that should arise related to todays visit.  Orders Placed This Encounter  Procedures  . Spirometry with Graph    Order Specific Question:   Where should this test be performed?    Answer:   [facility]     Time spent: 25 This patient was seen by Orson Gear AGNP-C in Collaboration with Dr. Devona Konig as a part of collaborative care agreement.  I have personally obtained a history, examined the patient, evaluated laboratory and imaging results, formulated the assessment and plan and placed orders.    Allyne Gee, MD  [facility] Pulmonary and Critical Care Sleep medicine

## 2020-03-17 ENCOUNTER — Telehealth: Payer: Self-pay

## 2020-03-17 NOTE — Telephone Encounter (Signed)
Called lmom informing patient of appointment on 03/19/2020. klh 

## 2020-03-18 NOTE — Progress Notes (Signed)
[facility]  [address] [place], [place] [address] Phone: [phone]  Fax: [phone]   Clinic Day:  03/19/2020  Referring physician: Sofie Hartigan, MD  Chief Complaint: Angelica Juarez is a 79 y.o. 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   HPI: The patient was last seen in the hematology clinic on 11/14/2019 via telephone. At that time, she was doing well. She denied any B symptoms. Hematocrit was 40.6, hemoglobin 14.2, platelets 326,000, WBC 10,500 (ANC 8,500). Sodium was 134. Potassium was 3.3. She did not require a phlebotomy. Patient continued on hydroxyurea 500 mg a day.   She was last seen in pulmonology by Dr. Versie Starks on 02/06/2020. Her shortness of breath was stable. She reported no chest pain or shortness of breath. He will continue to monitor the patient.   She saw Dr. Holley Raring on 12/16/2019. Kidney function was stable. She had good glycemic control. Follow up was planned for 04/14/2020.   Labs on 12/20/2019 showed hematocrit 40.3, hemoglobin 14.2, platelets 337,000, WBC 10,700 (ANC 7,900).  Monocyte count was 1,400.    During the interim, the patient was doing "ok". She notes some on and off diarrhea for more than 1 month. Her PCP is following her diarrhea and noted if it persist she will be referred to GI. She notes having multiple stool studies that were all negative. She feels like none of her medications are the cause for her diarrhea. The patient denies being on any new medication. She does eat out a lot. She denies having a history of IBS.  She reports getting her first COVID-19 vaccine on 02/28/2020 and last COVID-19 vaccine 2 weeks ago without symptoms. She notes some facial bumps on her face with associated itching x 3 weeks. She has cats and she thought she had ring worm. She notes that she is allergic to cats.   She had not been on her CPAP machine nightly. She is on 2 liters of oxygen. She  is no longer on sleeping medication. She continues to receive eye injections. She denies having any recent falls. She remains on hydroxyurea 500 mg a day. The patient was sad because lost her daughter last month to a heart attack. She notes that she is having a difficult time.    Past Medical History:  Diagnosis Date  . Allergy    Seasonal  . Arthritis   . 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 Chronic kidney disease    chronic renal insufficiency  . Colon polyps 05/21/14  . COPD (chronic obstructive pulmonary disease) ([place])   . Diabetes mellitus without complication ([place])   . Dyspnea   . Fracture closed, humerus, shaft    right   . History of kidney stones    40 years ago  . Hyperlipidemia 04/30/14  . Hypertension   . Impingement syndrome of right shoulder 12/29/15  . Personal history of radiation therapy   . Pneumonia    hx  . Rotator cuff tear 04/21/16   right  . Seizures ([place])   . Sleep apnea     Past Surgical History:  Procedure Laterality Date  . ABDOMINAL HYSTERECTOMY    . 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 EYE SURGERY     bilateral cataracts  . JOINT REPLACEMENT     bilateral knee replacement  . NASAL SEPTUM SURGERY    . OOPHORECTOMY    .  REPLACEMENT TOTAL KNEE Bilateral 2004  . SHOULDER ARTHROSCOPY WITH BICEPSTENOTOMY Right 05/25/2016   Procedure: SHOULDER ARTHROSCOPY WITH BICEPSTENOTOMY;  Surgeon: Leanor Kail, MD;  Location: [facility] ORS;  Service: Orthopedics;  Laterality: Right;  . SHOULDER ARTHROSCOPY WITH DISTAL CLAVICLE RESECTION Right 05/25/2016   Procedure: SHOULDER ARTHROSCOPY WITH DISTAL CLAVICLE RESECTION;  Surgeon: Leanor Kail, MD;  Location: [facility] ORS;  Service: Orthopedics;  Laterality: Right;  . SHOULDER ARTHROSCOPY WITH OPEN ROTATOR CUFF REPAIR Right 05/25/2016   Procedure: SHOULDER ARTHROSCOPY WITH OPEN ROTATOR CUFF REPAIR;  Surgeon: Leanor Kail, MD;  Location: [facility] ORS;  Service: Orthopedics;  Laterality: Right;  .  SUBACROMIAL DECOMPRESSION Right 05/25/2016   Procedure: SUBACROMIAL DECOMPRESSION;  Surgeon: Leanor Kail, MD;  Location: [facility] ORS;  Service: Orthopedics;  Laterality: Right;    Family History  Problem Relation Age of Onset  . Breast cancer Paternal Aunt 71  . Diabetes Father   . Heart disease Father     Social History:  reports that she quit smoking about 14 years ago. She has a 40.00 pack-year smoking history. She has never used smokeless tobacco. She reports that she does not drink alcohol or use drugs. Patient moved to [place] in 2001 from [place]. Patient is a former 1 ppd smoker x 40 years; quit in 2006. She is retired from [employer]. Patient denies known exposures to radiation on toxins. Her significant other is [employer]. She lives in [place]. She lost her daughter to a heart attack last month. The patient is alone today.   Allergies:  Allergies  Allergen Reactions  . Iodinated Diagnostic Agents Anaphylaxis    Other reaction(s): Other (See Comments) Throat swells and extreme hives  . Latex Itching  . Phenobarbital Hives  . Tape Rash    silicones    Current Medications: Current Outpatient Medications  Medication Sig Dispense Refill  . albuterol (ACCUNEB) 1.25 MG/3ML nebulizer solution Take 3 mLs (1.25 mg total) by nebulization every 6 (six) hours as needed. wheezing 75 mL 3  . apixaban (ELIQUIS) 5 MG TABS tablet Take 1 tablet (5 mg total) by mouth 2 (two) times daily. 60 tablet 0  . aspirin 81 MG tablet Take 81 mg by mouth daily.    Marland Kitchen atorvastatin (LIPITOR) 40 MG tablet Take 40 mg by mouth daily.    . B Complex Vitamins (VITAMIN B COMPLEX PO) Take 1 tablet by mouth daily.    . diphenhydrAMINE (BENADRYL) 25 mg capsule Take 25 mg by mouth daily.    . fluticasone (FLONASE) 50 MCG/ACT nasal spray Place 2 sprays into both nostrils daily. 16 g 2  . Fluticasone-Umeclidin-Vilant (TRELEGY ELLIPTA) 100-62.5-25 MCG/INH AEPB Inhale 1 puff into the lungs daily. 1 each 4  . furosemide  (LASIX) 20 MG tablet Take 1 tablet (20 mg total) by mouth 2 (two) times daily. 60 tablet 0  . hydrALAZINE (APRESOLINE) 100 MG tablet Take 50 mg by mouth 2 (two) times daily.     . hydroxyurea (HYDREA) 500 MG capsule TAKE 1 CAPSULE(500 MG) BY MOUTH DAILY. MAY TAKE WITH FOOD TO MINIMIZE GI SIDE EFFECTS 90 capsule 0  . levETIRAcetam (KEPPRA) 750 MG tablet Take 750 mg by mouth 2 (two) times daily.    . metFORMIN (GLUCOPHAGE) 500 MG tablet Take 500 mg by mouth 2 (two) times daily with a meal.    . montelukast (SINGULAIR) 10 MG tablet TAKE 1 TABLET BY MOUTH EVERY DAY (Patient taking differently: Take 10 mg by mouth daily. ) 90 tablet 0  . Multiple Vitamin (  MULTI-VITAMINS) TABS Take 1 tablet by mouth daily.    Marland Kitchen NIFEdipine (NIFEDICAL XL) 30 MG 24 hr tablet Take 30 mg by mouth daily.    Marland Kitchen OVER THE COUNTER MEDICATION Place 1 drop into both eyes daily. Allergy eye drops    . OXYGEN Inhale 2 L into the lungs.    . sitaGLIPtin (JANUVIA) 25 MG tablet Take 1 tablet by mouth daily.    Marland Kitchen umeclidinium-vilanterol (ANORO ELLIPTA) 62.5-25 MCG/INH AEPB Inhale 1 puff into the lungs daily. 180 each 3  . valsartan-hydrochlorothiazide (DIOVAN-HCT) 320-25 MG tablet Take 1 tablet by mouth daily.  0  . VENTOLIN HFA 108 (90 Base) MCG/ACT inhaler INHALE 2 PUFFS INTO THE LUNGS EVERY 6 HOURS AS NEEDED (Patient taking differently: Inhale 2 puffs into the lungs every 6 (six) hours as needed for wheezing or shortness of breath. ) 54 g 1  . vitamin C (ASCORBIC ACID) 500 MG tablet Take 500 mg by mouth 2 (two) times daily.    Marland Kitchen EPINEPHrine 0.3 mg/0.3 mL IJ SOAJ injection Inject 0.3 mg into the muscle as needed for anaphylaxis.      No current facility-administered medications for this visit.    Review of Systems  Constitutional: Negative for chills, diaphoresis, fever, malaise/fatigue and weight loss (stable).       Doing "ok". Using portable oxygen.  HENT: Negative.  Negative for congestion, hearing loss, nosebleeds, sinus pain  and sore throat.   Eyes: Negative.  Negative for blurred vision, double vision and photophobia.       Macular degeneration left eye, receives injection.   Respiratory: Negative for cough, hemoptysis, sputum production and shortness of breath.        Sleep apnea - not using nocturnal CPAP. COPD on 2 liters/min of oxygen.  Cardiovascular: Negative for chest pain, palpitations, orthopnea, leg swelling and PND.       Notes "valve issues" and f/u at [facility]  Gastrointestinal: Positive for diarrhea (on anf off x 1 month). Negative for abdominal pain, blood in stool, constipation, melena, nausea and vomiting.  Genitourinary: Negative.  Negative for dysuria, frequency, hematuria and urgency.  Musculoskeletal: Negative for back pain (DDD), falls, joint pain (OA in shoulders) and neck pain.  Skin: Positive for itching (facial bumps; x 3week). Negative for rash.  Neurological: Negative.  Negative for dizziness, tremors, sensory change, speech change, weakness and headaches.  Endo/Heme/Allergies: Positive for environmental allergies (season; recieves allergy injections every 4 weeks). Does not bruise/bleed easily.  Psychiatric/Behavioral: Negative for depression and memory loss. The patient is not nervous/anxious and does not have insomnia.        Tearful because her daughter passed.  All other systems reviewed and are negative.  Performance status (ECOG): 1  Vitals Blood pressure (!) 147/80, pulse 100, temperature 98.2 F (36.8 C), temperature source Tympanic, resp. rate 18, height 5\' 7"  (1.702 m), weight 220 lb 7.4 oz (100 kg), SpO2 98 %.  Physical Exam  Nursing note and vitals reviewed. Constitutional: She is oriented to person, place, and time. She appears well-developed and well-nourished. No distress.  HENT:  Head: Normocephalic and atraumatic.  Mouth/Throat: Oropharynx is clear and moist. No oropharyngeal exudate.  Short gray hair. Mask.  Eyes: Pupils are equal, round, and reactive to light.  Conjunctivae and EOM are normal. No scleral icterus.  Glasses.  Cardiovascular: Normal rate, regular rhythm and normal heart sounds.  No murmur heard. Respiratory: Effort normal and breath sounds normal. No respiratory distress. She has no wheezes. She has no rales.  She exhibits no tenderness.  GI: Soft. Bowel sounds are normal. She exhibits no distension. There is abdominal tenderness.  Musculoskeletal:        General: No tenderness or edema. Normal range of motion.     Cervical back: Normal range of motion and neck supple.  Lymphadenopathy:    She has no cervical adenopathy.    She has no axillary adenopathy.  Neurological: She is alert and oriented to person, place, and time.  Skin: Skin is warm and dry. She is not diaphoretic.  Psychiatric: She has a normal mood and affect. Her behavior is normal. Judgment and thought content normal.  Tearful.     Appointment on 03/19/2020  Component Date Value Ref Range Status  . Sodium 03/19/2020 138  135 - 145 mmol/L Final  . Potassium 03/19/2020 3.7  3.5 - 5.1 mmol/L Final  . Chloride 03/19/2020 103  98 - 111 mmol/L Final  . CO2 03/19/2020 26  22 - 32 mmol/L Final  . Glucose, Bld 03/19/2020 181* 70 - 99 mg/dL Final   Glucose reference range applies only to samples taken after fasting for at least 8 hours.  . BUN 03/19/2020 19  8 - 23 mg/dL Final  . Creatinine, Ser 03/19/2020 0.93  0.44 - 1.00 mg/dL Final  . Calcium 03/19/2020 9.4  8.9 - 10.3 mg/dL Final  . Total Protein 03/19/2020 6.5  6.5 - 8.1 g/dL Final  . Albumin 03/19/2020 3.5  3.5 - 5.0 g/dL Final  . AST 03/19/2020 26  15 - 41 U/L Final  . ALT 03/19/2020 23  0 - 44 U/L Final  . Alkaline Phosphatase 03/19/2020 91  38 - 126 U/L Final  . Total Bilirubin 03/19/2020 0.8  0.3 - 1.2 mg/dL Final  . GFR calc non Af Amer 03/19/2020 60* >60 mL/min Final  . GFR calc Af Amer 03/19/2020 >60  >60 mL/min Final  . Anion gap 03/19/2020 9  5 - 15 Final   Performed at [facility],  [address], [place], [place] [address]  . WBC 03/19/2020 13.8* 4.0 - 10.5 K/uL Final  . RBC 03/19/2020 4.32  3.87 - 5.11 MIL/uL Final  . Hemoglobin 03/19/2020 14.3  12.0 - 15.0 g/dL Final  . HCT 03/19/2020 40.8  36.0 - 46.0 % Final  . MCV 03/19/2020 94.4  80.0 - 100.0 fL Final  . MCH 03/19/2020 33.1  26.0 - 34.0 pg Final  . MCHC 03/19/2020 35.0  30.0 - 36.0 g/dL Final  . RDW 03/19/2020 12.6  11.5 - 15.5 % Final  . Platelets 03/19/2020 433* 150 - 400 K/uL Final  . nRBC 03/19/2020 0.0  0.0 - 0.2 % Final  . Neutrophils Relative % 03/19/2020 81  % Final  . Neutro Abs 03/19/2020 11.3* 1.7 - 7.7 K/uL Final  . Lymphocytes Relative 03/19/2020 5  % Final  . Lymphs Abs 03/19/2020 0.7  0.7 - 4.0 K/uL Final  . Monocytes Relative 03/19/2020 8  % Final  . Monocytes Absolute 03/19/2020 1.1* 0.1 - 1.0 K/uL Final  . Eosinophils Relative 03/19/2020 4  % Final  . Eosinophils Absolute 03/19/2020 0.5  0.0 - 0.5 K/uL Final  . Basophils Relative 03/19/2020 1  % Final  . Basophils Absolute 03/19/2020 0.2* 0.0 - 0.1 K/uL Final  . Immature Granulocytes 03/19/2020 1  % Final  . Abs Immature Granulocytes 03/19/2020 0.14* 0.00 - 0.07 K/uL Final   Performed at [facility], [address], [place], [place] [address]  Assessment:  Angelica Juarez is a 79 y.o. female withpolycythemia rubra vera.She has had sleep apnea x 7 years and uses CPAP. She has a 40 pack year smoking history (stopped 13 years ago). She denies and cardiac history.  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 There was no significant dysplasia or increased blasts. Overall features were c/w polycythemia rubra vera.  Work up on 08/08/2018 revealed a WBC of 16,700 ([place] 13,800). Hemoglobin 15.3, hematocrit 45.5, MCV 87.9, and platelets 552,000. Erythropoietin level was normal at 2.9 mIU/mL. BCR/ABL demonstrated no BCR or ABL gene rearrangements. Carbon monoxide  level was normal at 3.0% (0-3.6 %). 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   She began a phlebotomy programon 09/07/2018 (last04/30/2020). Hematocrit goalis <=42.  She began hydroxyurea500 mg a day on 09/12/2018.  She was diagnosed with C difficile + diarrheaon 10/10/2018; completed 10 day course of oral vancomycin. 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  She received her first COVID-19 vaccine on 02/28/2020 and last COVID-19 vaccine 2 weeks ago  Symptomatically, she feels okay.  She denies any fevers, sweats or weight loss.  She has no adenopathy or hepatosplenomegaly.  Plan: 1.   Labs today: CBC with diff, CMP. 2.Polycythemia rubra vera Hematocrit40.8. Hemoglobin10.3.Platelets33,000 Hematocrit goal <= 42. Platelet goal< 400,000. No phlebotomy today. Continue hydroxyurea 500 mg a day. 3.   RTC monthly x2 for labs (CBC with diff). 4.   RTC in 3 months for MD assessment and labs (CBC with diff, CMP).  I discussed the assessment and treatment plan with the patient.  The patient was provided an opportunity to ask questions and all were answered.  The patient agreed with the plan and demonstrated an understanding of the instructions.  The patient was advised to call back if the symptoms worsen or if the condition fails to improve as anticipated.  I provided 20 minutes of face-to-face visit time during this encounter and > 50% was spent counseling as documented under my assessment and plan.  I provided these services from the [facility] office.   Lequita Asal, MD, PhD    03/19/2020, 1:36 PM  I, Selena Batten, am acting as scribe for [employer]. Mike Gip, MD, PhD.  I, Lekesha Claw C. Mike Gip, MD, have reviewed the above documentation for accuracy and completeness, and I agree with the above.

## 2020-03-19 ENCOUNTER — Inpatient Hospital Stay (HOSPITAL_BASED_OUTPATIENT_CLINIC_OR_DEPARTMENT_OTHER): Payer: Medicare Other | Admitting: Hematology and Oncology

## 2020-03-19 ENCOUNTER — Encounter: Payer: Self-pay | Admitting: Hematology and Oncology

## 2020-03-19 ENCOUNTER — Inpatient Hospital Stay: Payer: Medicare Other | Attending: Hematology and Oncology

## 2020-03-19 ENCOUNTER — Inpatient Hospital Stay: Payer: Medicare Other

## 2020-03-19 ENCOUNTER — Other Ambulatory Visit: Payer: Self-pay

## 2020-03-19 ENCOUNTER — Ambulatory Visit: Payer: PRIVATE HEALTH INSURANCE | Admitting: Internal Medicine

## 2020-03-19 VITALS — BP 147/80 | HR 100 | Temp 98.2°F | Resp 18 | Ht 67.0 in | Wt 220.5 lb

## 2020-03-19 DIAGNOSIS — D45 Polycythemia vera: Secondary | ICD-10-CM | POA: Diagnosis present

## 2020-03-19 LAB — COMPREHENSIVE METABOLIC PANEL
ALT: 23 U/L (ref 0–44)
AST: 26 U/L (ref 15–41)
Albumin: 3.5 g/dL (ref 3.5–5.0)
Alkaline Phosphatase: 91 U/L (ref 38–126)
Anion gap: 9 (ref 5–15)
BUN: 19 mg/dL (ref 8–23)
CO2: 26 mmol/L (ref 22–32)
Calcium: 9.4 mg/dL (ref 8.9–10.3)
Chloride: 103 mmol/L (ref 98–111)
Creatinine, Ser: 0.93 mg/dL (ref 0.44–1.00)
GFR calc Af Amer: >60 mL/min (ref >60)
GFR calc non Af Amer: 60 mL/min — ABNORMAL LOW (ref >60)
Glucose, Bld: 181 mg/dL — ABNORMAL HIGH (ref 70–99)
Potassium: 3.7 mmol/L (ref 3.5–5.1)
Sodium: 138 mmol/L (ref 135–145)
Total Bilirubin: 0.8 mg/dL (ref 0.3–1.2)
Total Protein: 6.5 g/dL (ref 6.5–8.1)

## 2020-03-19 LAB — CBC WITH DIFFERENTIAL/PLATELET
Abs Immature Granulocytes: 0.14 10*3/uL — ABNORMAL HIGH (ref 0.00–0.07)
Basophils Absolute: 0.2 10*3/uL — ABNORMAL HIGH (ref 0.0–0.1)
Basophils Relative: 1 %
Eosinophils Absolute: 0.5 10*3/uL (ref 0.0–0.5)
Eosinophils Relative: 4 %
HCT: 40.8 % (ref 36.0–46.0)
Hemoglobin: 14.3 g/dL (ref 12.0–15.0)
Immature Granulocytes: 1 %
Lymphocytes Relative: 5 %
Lymphs Abs: 0.7 10*3/uL (ref 0.7–4.0)
MCH: 33.1 pg (ref 26.0–34.0)
MCHC: 35 g/dL (ref 30.0–36.0)
MCV: 94.4 fL (ref 80.0–100.0)
Monocytes Absolute: 1.1 10*3/uL — ABNORMAL HIGH (ref 0.1–1.0)
Monocytes Relative: 8 %
Neutro Abs: 11.3 10*3/uL — ABNORMAL HIGH (ref 1.7–7.7)
Neutrophils Relative %: 81 %
Platelets: 433 10*3/uL — ABNORMAL HIGH (ref 150–400)
RBC: 4.32 MIL/uL (ref 3.87–5.11)
RDW: 12.6 % (ref 11.5–15.5)
WBC: 13.8 10*3/uL — ABNORMAL HIGH (ref 4.0–10.5)
nRBC: 0 % (ref 0.0–0.2)

## 2020-03-19 MED ORDER — HYDROXYUREA 500 MG PO CAPS: ORAL_CAPSULE | ORAL | 1 refills | Status: DC

## 2020-03-19 NOTE — Progress Notes (Signed)
No new changes noted today 

## 2020-03-19 NOTE — Patient Instructions (Addendum)
  Increase hydroxyurea to 1 pill by mouth for 6 days/week (Tuesdays-Sundays) and 1 pill twice a day for 1 day/week (Mondays).

## 2020-03-27 ENCOUNTER — Telehealth: Payer: Self-pay

## 2020-03-27 NOTE — Telephone Encounter (Signed)
Confirmed appointment on 03/31/2020 and screened for covid. klh 

## 2020-03-31 ENCOUNTER — Ambulatory Visit: Payer: PRIVATE HEALTH INSURANCE | Admitting: Internal Medicine

## 2020-03-31 ENCOUNTER — Telehealth: Payer: Self-pay

## 2020-03-31 NOTE — Telephone Encounter (Signed)
Called lmom informing patient of appointment on 04/02/2020. klh

## 2020-04-02 ENCOUNTER — Ambulatory Visit: Payer: Medicare Other | Admitting: Internal Medicine

## 2020-04-08 ENCOUNTER — Other Ambulatory Visit: Payer: Self-pay | Admitting: *Deleted

## 2020-04-08 DIAGNOSIS — D45 Polycythemia vera: Secondary | ICD-10-CM

## 2020-04-14 ENCOUNTER — Ambulatory Visit (INDEPENDENT_AMBULATORY_CARE_PROVIDER_SITE_OTHER): Payer: Medicare Other | Admitting: Internal Medicine

## 2020-04-14 ENCOUNTER — Other Ambulatory Visit: Payer: Self-pay | Admitting: Family Medicine

## 2020-04-14 ENCOUNTER — Encounter: Payer: Self-pay | Admitting: Internal Medicine

## 2020-04-14 ENCOUNTER — Other Ambulatory Visit: Payer: Self-pay

## 2020-04-14 VITALS — BP 146/71 | HR 97 | Temp 97.1°F | Resp 16 | Ht 67.0 in | Wt 224.0 lb

## 2020-04-14 DIAGNOSIS — G4733 Obstructive sleep apnea (adult) (pediatric): Secondary | ICD-10-CM

## 2020-04-14 DIAGNOSIS — J9611 Chronic respiratory failure with hypoxia: Secondary | ICD-10-CM | POA: Diagnosis not present

## 2020-04-14 DIAGNOSIS — J301 Allergic rhinitis due to pollen: Secondary | ICD-10-CM

## 2020-04-14 DIAGNOSIS — Z1231 Encounter for screening mammogram for malignant neoplasm of breast: Secondary | ICD-10-CM

## 2020-04-14 DIAGNOSIS — R0602 Shortness of breath: Secondary | ICD-10-CM

## 2020-04-14 DIAGNOSIS — Z9989 Dependence on other enabling machines and devices: Secondary | ICD-10-CM

## 2020-04-14 NOTE — Progress Notes (Signed)
[facility] [place], [place] [address]  Pulmonary Sleep Medicine   Office Visit Note  Patient Name: Angelica Juarez DOB: 1941/07/04 MRN [id number]  Date of Service: 04/14/2020  Complaints/HPI: Allergies seem to be acting up again. She was on allergy injections and stopped in October. Patient states that she was considering restarting. She completed 5 years of the shots She states that they did help her as far as symptoms. She has not been retested for the allergies since that time. .She is on oxygen and has been using it regularly. She has not had SOB since she has been on oxygen as far as her allergies are concerned.she states that she has about 5 cats that live here.  She does have some support and rhinitis but has not been retested for allergies.  Recommend getting a follow-up test to determine whether she still needs to be on shots.  As far as her sleep apnea is concerned she is on the CPAP device and states she received a new machine and her OSA has been under good control.  Denies any cough no sinus problems being on the CPAP  ROS  General: (-) fever, (-) chills, (-) night sweats, (-) weakness Skin: (-) rashes, (-) itching,. Eyes: (-) visual changes, (-) redness, (-) itching. Nose and Sinuses: (-) nasal stuffiness or itchiness, (-) postnasal drip, (-) nosebleeds, (-) sinus trouble. Mouth and Throat: (-) sore throat, (-) hoarseness. Neck: (-) swollen glands, (-) enlarged thyroid, (-) neck pain. Respiratory: - cough, (-) bloody sputum, + shortness of breath, - wheezing. Cardiovascular: - ankle swelling, (-) chest pain. Lymphatic: (-) lymph node enlargement. Neurologic: (-) numbness, (-) tingling. Psychiatric: (-) anxiety, (-) depression   Current Medication: Outpatient Encounter Medications as of 04/14/2020  Medication Sig  . albuterol (ACCUNEB) 1.25 MG/3ML nebulizer solution Take 3 mLs (1.25 mg total) by nebulization every 6 (six) hours as needed. wheezing  .  apixaban (ELIQUIS) 5 MG TABS tablet Take 1 tablet (5 mg total) by mouth 2 (two) times daily.  Marland Kitchen aspirin 81 MG tablet Take 81 mg by mouth daily.  Marland Kitchen atorvastatin (LIPITOR) 40 MG tablet Take 40 mg by mouth daily.  . B Complex Vitamins (VITAMIN B COMPLEX PO) Take 1 tablet by mouth daily.  . diphenhydrAMINE (BENADRYL) 25 mg capsule Take 25 mg by mouth daily.  Marland Kitchen EPINEPHrine 0.3 mg/0.3 mL IJ SOAJ injection Inject 0.3 mg into the muscle as needed for anaphylaxis.   . fluticasone (FLONASE) 50 MCG/ACT nasal spray Place 2 sprays into both nostrils daily.  . Fluticasone-Umeclidin-Vilant (TRELEGY ELLIPTA) 100-62.5-25 MCG/INH AEPB Inhale 1 puff into the lungs daily.  . furosemide (LASIX) 20 MG tablet Take 1 tablet (20 mg total) by mouth 2 (two) times daily.  . hydrALAZINE (APRESOLINE) 100 MG tablet Take 50 mg by mouth 2 (two) times daily.   . hydroxyurea (HYDREA) 500 MG capsule Take 1 pill twice a day on Mondays only and 1 pill a day by mouth on Tuesdays-Sundays. May take with food to minimize GI side effects.  . levETIRAcetam (KEPPRA) 750 MG tablet Take 750 mg by mouth 2 (two) times daily.  . metFORMIN (GLUCOPHAGE) 500 MG tablet Take 500 mg by mouth 2 (two) times daily with a meal.  . montelukast (SINGULAIR) 10 MG tablet TAKE 1 TABLET BY MOUTH EVERY DAY (Patient taking differently: Take 10 mg by mouth daily. )  . Multiple Vitamin (MULTI-VITAMINS) TABS Take 1 tablet by mouth daily.  Marland Kitchen NIFEdipine (NIFEDICAL XL) 30 MG  24 hr tablet Take 30 mg by mouth daily.  Marland Kitchen OVER THE COUNTER MEDICATION Place 1 drop into both eyes daily. Allergy eye drops  . OXYGEN Inhale 2 L into the lungs.  . sitaGLIPtin (JANUVIA) 25 MG tablet Take 1 tablet by mouth daily.  Marland Kitchen umeclidinium-vilanterol (ANORO ELLIPTA) 62.5-25 MCG/INH AEPB Inhale 1 puff into the lungs daily.  . valsartan-hydrochlorothiazide (DIOVAN-HCT) 320-25 MG tablet Take 1 tablet by mouth daily.  . VENTOLIN HFA 108 (90 Base) MCG/ACT inhaler INHALE 2 PUFFS INTO THE LUNGS  EVERY 6 HOURS AS NEEDED (Patient taking differently: Inhale 2 puffs into the lungs every 6 (six) hours as needed for wheezing or shortness of breath. )  . vitamin C (ASCORBIC ACID) 500 MG tablet Take 500 mg by mouth 2 (two) times daily.   No facility-administered encounter medications on file as of 04/14/2020.    Surgical History: Past Surgical History:  Procedure Laterality Date  . ABDOMINAL HYSTERECTOMY    . 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 EYE SURGERY     bilateral cataracts  . JOINT REPLACEMENT     bilateral knee replacement  . NASAL SEPTUM SURGERY    . OOPHORECTOMY    . REPLACEMENT TOTAL KNEE Bilateral 2004  . SHOULDER ARTHROSCOPY WITH BICEPSTENOTOMY Right 05/25/2016   Procedure: SHOULDER ARTHROSCOPY WITH BICEPSTENOTOMY;  Surgeon: Leanor Kail, MD;  Location: [facility] ORS;  Service: Orthopedics;  Laterality: Right;  . SHOULDER ARTHROSCOPY WITH DISTAL CLAVICLE RESECTION Right 05/25/2016   Procedure: SHOULDER ARTHROSCOPY WITH DISTAL CLAVICLE RESECTION;  Surgeon: Leanor Kail, MD;  Location: [facility] ORS;  Service: Orthopedics;  Laterality: Right;  . SHOULDER ARTHROSCOPY WITH OPEN ROTATOR CUFF REPAIR Right 05/25/2016   Procedure: SHOULDER ARTHROSCOPY WITH OPEN ROTATOR CUFF REPAIR;  Surgeon: Leanor Kail, MD;  Location: [facility] ORS;  Service: Orthopedics;  Laterality: Right;  . SUBACROMIAL DECOMPRESSION Right 05/25/2016   Procedure: SUBACROMIAL DECOMPRESSION;  Surgeon: Leanor Kail, MD;  Location: [facility] ORS;  Service: Orthopedics;  Laterality: Right;    Medical History: Past Medical History:  Diagnosis Date  . Allergy    Seasonal  . Arthritis   . 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 Chronic kidney disease    chronic renal insufficiency  . Colon polyps 05/21/14  . COPD (chronic obstructive pulmonary disease) ([place])   . Diabetes mellitus without complication ([place])   . Dyspnea   . Fracture closed, humerus, shaft    right   . History of kidney stones     40 years ago  . Hyperlipidemia 04/30/14  . Hypertension   . Impingement syndrome of right shoulder 12/29/15  . Personal history of radiation therapy   . Pneumonia    hx  . Rotator cuff tear 04/21/16   right  . Seizures ([place])   . Sleep apnea     Family History: Family History  Problem Relation Age of Onset  . Breast cancer Paternal Aunt 28  . Diabetes Father   . Heart disease Father     Social History: Social History   Socioeconomic History  . Marital status: Single    Spouse name: Not on file  . Number of children: Not on file  . Years of education: Not on file  . Highest education level: Not on file  Occupational History  . Not on file  Tobacco Use  . Smoking status: Former Smoker    Packs/day: 1.00    Years: 40.00    Pack years: 40.00  Quit date: 05/12/2005    Years since quitting: 14.9  . Smokeless tobacco: Never Used  Substance and Sexual Activity  . Alcohol use: No  . Drug use: No  . Sexual activity: Not on file  Other Topics Concern  . Not on file  Social History Narrative  . Not on file   Social Determinants of Health   Financial Resource Strain:   . Difficulty of Paying Living Expenses:   Food Insecurity:   . Worried About Charity fundraiser in the Last Year:   . Arboriculturist in the Last Year:   Transportation Needs:   . Film/video editor (Medical):   Marland Kitchen Lack of Transportation (Non-Medical):   Physical Activity:   . Days of Exercise per Week:   . Minutes of Exercise per Session:   Stress:   . Feeling of Stress :   Social Connections:   . Frequency of Communication with Friends and Family:   . Frequency of Social Gatherings with Friends and Family:   . Attends Religious Services:   . Active Member of Clubs or Organizations:   . Attends Archivist Meetings:   Marland Kitchen Marital Status:   Intimate Partner Violence:   . Fear of Current or Ex-Partner:   . Emotionally Abused:   Marland Kitchen Physically Abused:   . Sexually Abused:     Vital  Signs: Blood pressure (!) 146/71, pulse 97, temperature (!) 97.1 F (36.2 C), resp. rate 16, height 5\' 7"  (1.702 m), weight 224 lb (101.6 kg), SpO2 95 %.  Examination: General Appearance: The patient is well-developed, well-nourished, and in no distress. Skin: Gross inspection of skin unremarkable. Head: normocephalic, no gross deformities. Eyes: no gross deformities noted. ENT: ears appear grossly normal no exudates. Neck: Supple. No thyromegaly. No LAD. Respiratory: no rhonchi  noted. Cardiovascular: Normal S1 and S2 without murmur or rub. Extremities: No cyanosis. pulses are equal. Neurologic: Alert and oriented. No involuntary movements.  LABS: Recent Results (from the past 2160 hour(s))  CBC     Status: Abnormal   Collection Time: 01/23/20 10:46 PM  Result Value Ref Range   WBC 11.7 (H) 4.0 - 10.5 K/uL   RBC 3.65 (L) 3.87 - 5.11 MIL/uL   Hemoglobin 12.2 12.0 - 15.0 g/dL   HCT 35.7 (L) 36.0 - 46.0 %   MCV 97.8 80.0 - 100.0 fL   MCH 33.4 26.0 - 34.0 pg   MCHC 34.2 30.0 - 36.0 g/dL   RDW 14.1 11.5 - 15.5 %   Platelets 384 150 - 400 K/uL   nRBC 0.0 0.0 - 0.2 %    Comment: Performed at [facility], [address] [place], [place] [address]  Comprehensive metabolic panel     Status: Abnormal   Collection Time: 01/23/20 10:46 PM  Result Value Ref Range   Sodium 140 135 - 145 mmol/L   Potassium 3.9 3.5 - 5.1 mmol/L   Chloride 107 98 - 111 mmol/L   CO2 25 22 - 32 mmol/L   Glucose, Bld 131 (H) 70 - 99 mg/dL    Comment: Glucose reference range applies only to samples taken after fasting for at least 8 hours.   BUN 13 8 - 23 mg/dL   Creatinine, Ser 0.67 0.44 - 1.00 mg/dL   Calcium 9.3 8.9 - 10.3 mg/dL   Total Protein 6.6 6.5 - 8.1 g/dL   Albumin 3.7 3.5 - 5.0 g/dL   AST 29 15 - 41 U/L   ALT  28 0 - 44 U/L   Alkaline Phosphatase 105 38 - 126 U/L   Total Bilirubin 1.0 0.3 - 1.2 mg/dL   GFR calc non Af Amer >60 >60 mL/min   GFR calc Af Amer >60 >60 mL/min   Anion  gap 8 5 - 15    Comment: Performed at [facility], [address] [place], [place] [address]  Comprehensive metabolic panel     Status: Abnormal   Collection Time: 03/19/20  1:03 PM  Result Value Ref Range   Sodium 138 135 - 145 mmol/L   Potassium 3.7 3.5 - 5.1 mmol/L   Chloride 103 98 - 111 mmol/L   CO2 26 22 - 32 mmol/L   Glucose, Bld 181 (H) 70 - 99 mg/dL    Comment: Glucose reference range applies only to samples taken after fasting for at least 8 hours.   BUN 19 8 - 23 mg/dL   Creatinine, Ser 0.93 0.44 - 1.00 mg/dL   Calcium 9.4 8.9 - 10.3 mg/dL   Total Protein 6.5 6.5 - 8.1 g/dL   Albumin 3.5 3.5 - 5.0 g/dL   AST 26 15 - 41 U/L   ALT 23 0 - 44 U/L   Alkaline Phosphatase 91 38 - 126 U/L   Total Bilirubin 0.8 0.3 - 1.2 mg/dL   GFR calc non Af Amer 60 (L) >60 mL/min   GFR calc Af Amer >60 >60 mL/min   Anion gap 9 5 - 15    Comment: Performed at [facility], [address] [place] [address]  CBC with Differential/Platelet     Status: Abnormal   Collection Time: 03/19/20  1:03 PM  Result Value Ref Range   WBC 13.8 (H) 4.0 - 10.5 K/uL   RBC 4.32 3.87 - 5.11 MIL/uL   Hemoglobin 14.3 12.0 - 15.0 g/dL   HCT 40.8 36.0 - 46.0 %   MCV 94.4 80.0 - 100.0 fL   MCH 33.1 26.0 - 34.0 pg   MCHC 35.0 30.0 - 36.0 g/dL   RDW 12.6 11.5 - 15.5 %   Platelets 433 (H) 150 - 400 K/uL   nRBC 0.0 0.0 - 0.2 %   Neutrophils Relative % 81 %   Neutro Abs 11.3 (H) 1.7 - 7.7 K/uL   Lymphocytes Relative 5 %   Lymphs Abs 0.7 0.7 - 4.0 K/uL   Monocytes Relative 8 %   Monocytes Absolute 1.1 (H) 0.1 - 1.0 K/uL   Eosinophils Relative 4 %   Eosinophils Absolute 0.5 0.0 - 0.5 K/uL   Basophils Relative 1 %   Basophils Absolute 0.2 (H) 0.0 - 0.1 K/uL   Immature Granulocytes 1 %   Abs Immature Granulocytes 0.14 (H) 0.00 - 0.07 K/uL    Comment: Performed at [facility], [address] [place], [place] [address]    Radiology: No results found.  No  results found.  No results found.    Assessment and Plan: Patient Active Problem List   Diagnosis Date Noted  . Hypokalemia 11/14/2019  . C. difficile diarrhea 11/24/2018  . Diarrhea 11/18/2018  . Nausea without vomiting 11/18/2018  . Goals of care, counseling/discussion 09/12/2018  . 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 History of repair of right rotator cuff 08/25/2016  . Acute respiratory failure with hypoxia ([place]) 08/06/2016  . Nontraumatic tear of right rotator cuff 04/21/2016  . Atypical chest pain 04/07/2016  . Impingement syndrome of right shoulder 12/29/2015  . Primary  osteoarthritis of first carpometacarpal joint of left hand 10/27/2015  . Right shoulder pain 10/27/2015  . OSA on CPAP 10/14/2015  . COPD with asthma ([place]) 07/13/2015  . Type 2 diabetes mellitus with microalbuminuria, without long-term current use of insulin ([place]) 07/13/2015  . Essential hypertension 03/05/2015  . Difficulty sleeping 11/26/2014  . CRI (chronic renal insufficiency) 07/31/2014  . Seizure disorder ([place]) 07/31/2014  . History of colonic polyps 05/21/2014  . Hyperlipidemia, unspecified 04/30/2014    1. COPD moderate to severe disease plan is clinically is doing follow-up pulmonary function test.  Last PFT that was done was back in 2019 there has been some decline in her breathing so it will be a good idea to reassess.  She will continue with her current medical management.  In addition she needs to continue with oxygen.  She does have significant shortness of breath and symptoms with any type of exertion.  In addition she uses the oxygen at nighttime while asleep on the CPAP 2. Allergic Rhinitis follow-up allergy test will be ordered to determine if she had a good response to the allergy shots that she was on.  Based on the results we will determine whether she needs to go back on the shots. 3. OSA on CPAP on CPAP right now doing well on the current pressures.  Plan will be to continue  with CPAP as ordered   General Counseling: I have discussed the findings of the evaluation and examination with Arbie Cookey.  I have also discussed any further diagnostic evaluation thatmay be needed or ordered today. Grissel verbalizes understanding of the findings of todays visit. We also reviewed her medications today and discussed drug interactions and side effects including but not limited excessive drowsiness and altered mental states. We also discussed that there is always a risk not just to her but also people around her. she has been encouraged to call the office with any questions or concerns that should arise related to todays visit.  Orders Placed This Encounter  Procedures  . Allergy Test    Order Specific Question:   Allergy test to perform    Answer:   [place]  . DG Chest 2 View    Standing Status:   Future    Standing Expiration Date:   04/14/2021    Order Specific Question:   Reason for Exam (SYMPTOM  OR DIAGNOSIS REQUIRED)    Answer:   sob    Order Specific Question:   Preferred imaging location?    Answer[facility]    [facility]    Order Specific Question:   Radiology Contrast Protocol - do NOT remove file path    Answer:   [email or web address]  . 6 minute walk    Order Specific Question:   Where should this test be performed?    Answer:   other  . Pulmonary function test    Standing Status:   Future    Standing Expiration Date:   04/14/2021    Order Specific Question:   Where should this test be performed?    Answer:   [facility]     Time spent: 104min  I have personally obtained a history, examined the patient, evaluated laboratory and imaging results, formulated the assessment and plan and placed orders.    Allyne Gee, MD [facility] Pulmonary and Critical Care Sleep medicine

## 2020-04-14 NOTE — Patient Instructions (Signed)

## 2020-04-16 ENCOUNTER — Inpatient Hospital Stay: Payer: Medicare Other | Attending: Hematology and Oncology

## 2020-04-16 ENCOUNTER — Other Ambulatory Visit: Payer: Self-pay

## 2020-04-16 DIAGNOSIS — D45 Polycythemia vera: Secondary | ICD-10-CM | POA: Insufficient documentation

## 2020-04-16 LAB — CBC WITH DIFFERENTIAL/PLATELET
Abs Immature Granulocytes: 0.07 10*3/uL (ref 0.00–0.07)
Basophils Absolute: 0.1 10*3/uL (ref 0.0–0.1)
Basophils Relative: 1 %
Eosinophils Absolute: 0.3 10*3/uL (ref 0.0–0.5)
Eosinophils Relative: 3 %
HCT: 40.5 % (ref 36.0–46.0)
Hemoglobin: 14 g/dL (ref 12.0–15.0)
Immature Granulocytes: 1 %
Lymphocytes Relative: 5 %
Lymphs Abs: 0.6 10*3/uL — ABNORMAL LOW (ref 0.7–4.0)
MCH: 32.7 pg (ref 26.0–34.0)
MCHC: 34.6 g/dL (ref 30.0–36.0)
MCV: 94.6 fL (ref 80.0–100.0)
Monocytes Absolute: 1 10*3/uL (ref 0.1–1.0)
Monocytes Relative: 9 %
Neutro Abs: 9.2 10*3/uL — ABNORMAL HIGH (ref 1.7–7.7)
Neutrophils Relative %: 81 %
Platelets: 330 10*3/uL (ref 150–400)
RBC: 4.28 MIL/uL (ref 3.87–5.11)
RDW: 14.2 % (ref 11.5–15.5)
WBC: 11.3 10*3/uL — ABNORMAL HIGH (ref 4.0–10.5)
nRBC: 0 % (ref 0.0–0.2)

## 2020-04-28 ENCOUNTER — Encounter: Payer: Self-pay | Admitting: Gastroenterology

## 2020-04-28 ENCOUNTER — Ambulatory Visit (INDEPENDENT_AMBULATORY_CARE_PROVIDER_SITE_OTHER): Payer: Medicare Other | Admitting: Gastroenterology

## 2020-04-28 ENCOUNTER — Other Ambulatory Visit: Payer: Self-pay

## 2020-04-28 VITALS — BP 151/80 | HR 94 | Temp 97.8°F | Ht 67.0 in | Wt 226.2 lb

## 2020-04-28 DIAGNOSIS — K529 Noninfective gastroenteritis and colitis, unspecified: Secondary | ICD-10-CM

## 2020-04-28 MED ORDER — NA SULFATE-K SULFATE-MG SULF 17.5-3.13-1.6 GM/177ML PO SOLN: 354.0000 mL | Freq: Once | ORAL | 0 refills | Status: AC

## 2020-04-28 NOTE — Progress Notes (Signed)
Cephas Darby, MD [address]  [place]  [place], [place] [address]  Main: [phone]  Fax: [phone]    Gastroenterology Consultation  Referring Provider:     Sofie Hartigan, MD Primary Care Physician:  Sofie Hartigan, MD Primary Gastroenterologist:  Dr. Cephas Darby Reason for Consultation:     Chronic diarrhea        HPI:   Angelica Juarez is a 79 y.o. female referred by Dr. Ellison Hughs Chrissie Noa, MD  for consultation & management of chronic diarrhea.  Patient has history of COPD on 2 L home oxygen, A. fib on Eliquis, metabolic syndrome.  Patient reports that she has been experiencing approximately 5 months history of nonbloody diarrhea, describes bowel movements as loose, 5-6 times daily.  She denies abdominal bloating, abdominal cramps.  Her weight has been stable.  She denies nocturnal diarrhea.  She underwent stool studies which were negative for infection.  She had history of C. difficile in 2020.  She does acknowledge drinking diet soda daily.  She does not follow low-sodium diet as well.  Patient has tried Imodium once, did not help Labs revealed mild leukocytosis  Patient is accompanied by her husband today She does not smoke or drink alcohol  NSAIDs: None  Antiplts/Anticoagulants/Anti thrombotics: Eliquis for history of A. fib  GI Procedures: As colonoscopy more than 10 years ago  Past Medical History:  Diagnosis Date   Allergy    Seasonal   Arthritis    Cancer ([place])    fallopian tubes- radiation   Chronic kidney disease    chronic renal insufficiency   Colon polyps 05/21/14   COPD (chronic obstructive pulmonary disease) (HCC)    Diabetes mellitus without complication ([place])    Dyspnea    Fracture closed, humerus, shaft    right    History of kidney stones    40 years ago   Hyperlipidemia 04/30/14   Hypertension    Impingement syndrome of right shoulder 12/29/15   Personal history of radiation therapy    Pneumonia    hx    Rotator cuff tear 04/21/16   right   Seizures ([place])    Sleep apnea     Past Surgical History:  Procedure Laterality Date   ABDOMINAL HYSTERECTOMY     EXPLORATORY LAPAROTOMY     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   EYE SURGERY     bilateral cataracts   JOINT REPLACEMENT     bilateral knee replacement   NASAL SEPTUM SURGERY     OOPHORECTOMY     REPLACEMENT TOTAL KNEE Bilateral 2004   SHOULDER ARTHROSCOPY WITH BICEPSTENOTOMY Right 05/25/2016   Procedure: SHOULDER ARTHROSCOPY WITH BICEPSTENOTOMY;  Surgeon: Leanor Kail, MD;  Location: [facility] ORS;  Service: Orthopedics;  Laterality: Right;   SHOULDER ARTHROSCOPY WITH DISTAL CLAVICLE RESECTION Right 05/25/2016   Procedure: SHOULDER ARTHROSCOPY WITH DISTAL CLAVICLE RESECTION;  Surgeon: Leanor Kail, MD;  Location: [facility];  Service: Orthopedics;  Laterality: Right;   SHOULDER ARTHROSCOPY WITH OPEN ROTATOR CUFF REPAIR Right 05/25/2016   Procedure: SHOULDER ARTHROSCOPY WITH OPEN ROTATOR CUFF REPAIR;  Surgeon: Leanor Kail, MD;  Location: [facility];  Service: Orthopedics;  Laterality: Right;   SUBACROMIAL DECOMPRESSION Right 05/25/2016   Procedure: SUBACROMIAL DECOMPRESSION;  Surgeon: Leanor Kail, MD;  Location: [facility];  Service: Orthopedics;  Laterality: Right;    Current Outpatient Medications:    albuterol (ACCUNEB) 1.25 MG/3ML nebulizer solution, Take 3 mLs (1.25 mg total) by nebulization every 6 (six) hours as  needed. wheezing, Disp: 75 mL, Rfl: 3   apixaban (ELIQUIS) 5 MG TABS tablet, Take 1 tablet (5 mg total) by mouth 2 (two) times daily., Disp: 60 tablet, Rfl: 0   aspirin 81 MG tablet, Take 81 mg by mouth daily., Disp: , Rfl:    atorvastatin (LIPITOR) 40 MG tablet, Take 40 mg by mouth daily., Disp: , Rfl:    B Complex Vitamins (VITAMIN B COMPLEX PO), Take 1 tablet by mouth daily., Disp: , Rfl:    diphenhydrAMINE (BENADRYL) 25 mg capsule, Take 25 mg by mouth daily., Disp: , Rfl:    EPINEPHrine 0.3 mg/0.3 mL IJ SOAJ  injection, Inject 0.3 mg into the muscle as needed for anaphylaxis. , Disp: , Rfl:    fluticasone (FLONASE) 50 MCG/ACT nasal spray, Place 2 sprays into both nostrils daily., Disp: 16 g, Rfl: 2   Fluticasone-Umeclidin-Vilant (TRELEGY ELLIPTA) 100-62.5-25 MCG/INH AEPB, Inhale 1 puff into the lungs daily., Disp: 1 each, Rfl: 4   furosemide (LASIX) 20 MG tablet, Take 1 tablet (20 mg total) by mouth 2 (two) times daily., Disp: 60 tablet, Rfl: 0   hydrALAZINE (APRESOLINE) 100 MG tablet, Take 50 mg by mouth 2 (two) times daily. , Disp: , Rfl:    hydroxyurea (HYDREA) 500 MG capsule, Take 1 pill twice a day on Mondays only and 1 pill a day by mouth on Tuesdays-Sundays. May take with food to minimize GI side effects., Disp: 100 capsule, Rfl: 1   levETIRAcetam (KEPPRA) 750 MG tablet, Take 750 mg by mouth 2 (two) times daily., Disp: , Rfl:    metFORMIN (GLUCOPHAGE) 500 MG tablet, Take 500 mg by mouth 2 (two) times daily with a meal., Disp: , Rfl:    montelukast (SINGULAIR) 10 MG tablet, TAKE 1 TABLET BY MOUTH EVERY DAY (Patient taking differently: Take 10 mg by mouth daily. ), Disp: 90 tablet, Rfl: 0   Multiple Vitamin (MULTI-VITAMINS) TABS, Take 1 tablet by mouth daily., Disp: , Rfl:    NIFEdipine (NIFEDICAL XL) 30 MG 24 hr tablet, Take 30 mg by mouth daily., Disp: , Rfl:    OVER THE COUNTER MEDICATION, Place 1 drop into both eyes daily. Allergy eye drops, Disp: , Rfl:    OXYGEN, Inhale 2 L into the lungs., Disp: , Rfl:    sitaGLIPtin (JANUVIA) 25 MG tablet, Take 1 tablet by mouth daily., Disp: , Rfl:    umeclidinium-vilanterol (ANORO ELLIPTA) 62.5-25 MCG/INH AEPB, Inhale 1 puff into the lungs daily., Disp: 180 each, Rfl: 3   valsartan-hydrochlorothiazide (DIOVAN-HCT) 320-25 MG tablet, Take 1 tablet by mouth daily., Disp: , Rfl: 0   VENTOLIN HFA 108 (90 Base) MCG/ACT inhaler, INHALE 2 PUFFS INTO THE LUNGS EVERY 6 HOURS AS NEEDED (Patient taking differently: Inhale 2 puffs into the lungs every 6  (six) hours as needed for wheezing or shortness of breath. ), Disp: 54 g, Rfl: 1   vitamin C (ASCORBIC ACID) 500 MG tablet, Take 500 mg by mouth 2 (two) times daily., Disp: , Rfl:    Na Sulfate-K Sulfate-Mg Sulf 17.5-3.13-1.6 GM/177ML SOLN, Take 354 mLs by mouth once for 1 dose., Disp: 354 mL, Rfl: 0    Family History  Problem Relation Age of Onset   Breast cancer Paternal Aunt 71   Diabetes Father    Heart disease Father      Social History   Tobacco Use   Smoking status: Former Smoker    Packs/day: 1.00    Years: 40.00    Pack years: 40.00  Quit date: 05/12/2005    Years since quitting: 14.9   Smokeless tobacco: Never Used  Vaping Use   Vaping Use: Never used  Substance Use Topics   Alcohol use: No   Drug use: No    Allergies as of 04/28/2020 - Review Complete 04/28/2020  Allergen Reaction Noted   Iodinated diagnostic agents Anaphylaxis 10/02/2012   Latex Itching 05/13/2016   Phenobarbital Hives 05/12/2016   Tape Rash 05/12/2016    Review of Systems:    All systems reviewed and negative except where noted in HPI.   Physical Exam:  BP (!) 151/80 (BP Location: Left Arm, Patient Position: Sitting, Cuff Size: Large)    Pulse 94    Temp 97.8 F (36.6 C) (Oral)    Ht 5\' 7"  (1.702 m)    Wt 226 lb 4 oz (102.6 kg)    BMI 35.44 kg/m  No LMP recorded. Patient is postmenopausal.  General:   Alert,  Well-developed, well-nourished, pleasant and cooperative in NAD Head:  Normocephalic and atraumatic. Eyes:  Sclera clear, no icterus.   Conjunctiva pink. Ears:  Normal auditory acuity. Nose:  No deformity, discharge, or lesions. Mouth:  No deformity or lesions,oropharynx pink & moist. Neck:  Supple; no masses or thyromegaly. Lungs:  Respirations even and unlabored.  Clear throughout to auscultation.   No wheezes, crackles, or rhonchi. No acute distress. Heart: Irregular rhythm, regular rate; no murmurs, clicks, rubs, or gallops. Abdomen:  Normal bowel sounds.  Soft, non-tender and non-distended without masses, hepatosplenomegaly or hernias noted.  No guarding or rebound tenderness.   Rectal: Not performed Msk:  Symmetrical without gross deformities. Good, equal movement & strength bilaterally. Pulses:  Normal pulses noted. Extremities:  No clubbing, 1+edema.  No cyanosis. Neurologic:  Alert and oriented x3;  grossly normal neurologically. Skin:  Intact without significant lesions or rashes. No jaundice. Psych:  Alert and cooperative. Normal mood and affect.  Imaging Studies: Reviewed  Assessment and Plan:   NIMRIT KEHRES is a 79 y.o. female with extobacco use, COPD on home oxygen, metabolic syndrome, A. fib on Eliquis, preserved EF is seen in consultation for chronic diarrhea  Chronic nonbloody diarrhea Stool studies are negative for infection Recommend EGD and colonoscopy with gastric, duodenal biopsies, TI evaluation and random colon biopsies Check pancreatic fecal elastase levels and fecal calprotectin levels   Follow up in 4 to 6 weeks   Cephas Darby, MD

## 2020-04-30 ENCOUNTER — Telehealth: Payer: Self-pay

## 2020-04-30 ENCOUNTER — Other Ambulatory Visit: Payer: Self-pay

## 2020-04-30 ENCOUNTER — Ambulatory Visit
Admission: RE | Admit: 2020-04-30 | Discharge: 2020-04-30 | Disposition: A | Discharge status: Home / Self Care | Payer: Medicare Other | Source: Ambulatory Visit | Attending: Family Medicine | Admitting: Family Medicine

## 2020-04-30 DIAGNOSIS — Z1231 Encounter for screening mammogram for malignant neoplasm of breast: Secondary | ICD-10-CM | POA: Insufficient documentation

## 2020-04-30 NOTE — Telephone Encounter (Signed)
Patient verbalized understanding of blood thinner request.

## 2020-04-30 NOTE — Telephone Encounter (Signed)
Dr. Ubaldo Glassing office faxed [place] the blood thinner request for the Eliquis. Patient should stop the Eliquis 2 days prior to procedure and restart immediately after the procedure.  Called patient on both numbers and left a message for call back

## 2020-04-30 NOTE — Telephone Encounter (Signed)
Confirmed appointment on 05/04/2020 and screened for covid. klh 

## 2020-05-04 ENCOUNTER — Ambulatory Visit: Payer: Medicare Other | Admitting: Internal Medicine

## 2020-05-06 ENCOUNTER — Ambulatory Visit (INDEPENDENT_AMBULATORY_CARE_PROVIDER_SITE_OTHER): Payer: Medicare Other | Admitting: Internal Medicine

## 2020-05-06 ENCOUNTER — Other Ambulatory Visit: Payer: Self-pay

## 2020-05-06 DIAGNOSIS — R0602 Shortness of breath: Secondary | ICD-10-CM | POA: Diagnosis not present

## 2020-05-06 LAB — CALPROTECTIN, FECAL: Calprotectin, Fecal: 175 ug/g — ABNORMAL HIGH (ref 0–120)

## 2020-05-06 LAB — PULMONARY FUNCTION TEST

## 2020-05-06 LAB — PANCREATIC ELASTASE, FECAL: Pancreatic Elastase, Fecal: 428 ug Elast./g (ref >200)

## 2020-05-08 ENCOUNTER — Telehealth: Payer: Self-pay

## 2020-05-08 MED ORDER — MESALAMINE 1.2 G PO TBEC
2.4000 g | DELAYED_RELEASE_TABLET | Freq: Two times a day (BID) | ORAL | 1 refills | Status: DC
Start: 2020-05-08 — End: 2020-10-06

## 2020-05-08 NOTE — Telephone Encounter (Signed)
Patient  verbalized unentertaining and wants medication sent to [facility]

## 2020-05-08 NOTE — Telephone Encounter (Signed)
-----   Message from Lin Landsman, MD sent at 05/08/2020 11:30 AM EDT ----- Elevated fecal calprotectin levels which is a marker of inflammatory bowel disease.  Will confirm on the colonoscopy.  In the meantime, given that she is having diarrhea, recommend trial of Lialda 2.4 g twice daily or Apriso 750 mg twice daily depending on her insurance  Rohini Vanga

## 2020-05-14 ENCOUNTER — Other Ambulatory Visit: Payer: Self-pay

## 2020-05-14 ENCOUNTER — Inpatient Hospital Stay: Payer: Medicare Other | Attending: Hematology and Oncology

## 2020-05-14 ENCOUNTER — Telehealth: Payer: Self-pay

## 2020-05-14 DIAGNOSIS — R0989 Other specified symptoms and signs involving the circulatory and respiratory systems: Secondary | ICD-10-CM | POA: Insufficient documentation

## 2020-05-14 DIAGNOSIS — Z86010 Personal history of colonic polyps: Secondary | ICD-10-CM | POA: Diagnosis not present

## 2020-05-14 DIAGNOSIS — R42 Dizziness and giddiness: Secondary | ICD-10-CM | POA: Diagnosis not present

## 2020-05-14 DIAGNOSIS — Z7982 Long term (current) use of aspirin: Secondary | ICD-10-CM | POA: Insufficient documentation

## 2020-05-14 DIAGNOSIS — I129 Hypertensive chronic kidney disease with stage 1 through stage 4 chronic kidney disease, or unspecified chronic kidney disease: Secondary | ICD-10-CM | POA: Insufficient documentation

## 2020-05-14 DIAGNOSIS — G4730 Sleep apnea, unspecified: Secondary | ICD-10-CM | POA: Insufficient documentation

## 2020-05-14 DIAGNOSIS — Z803 Family history of malignant neoplasm of breast: Secondary | ICD-10-CM | POA: Insufficient documentation

## 2020-05-14 DIAGNOSIS — J449 Chronic obstructive pulmonary disease, unspecified: Secondary | ICD-10-CM | POA: Diagnosis not present

## 2020-05-14 DIAGNOSIS — J90 Pleural effusion, not elsewhere classified: Secondary | ICD-10-CM | POA: Insufficient documentation

## 2020-05-14 DIAGNOSIS — Z7984 Long term (current) use of oral hypoglycemic drugs: Secondary | ICD-10-CM | POA: Insufficient documentation

## 2020-05-14 DIAGNOSIS — Z79899 Other long term (current) drug therapy: Secondary | ICD-10-CM | POA: Diagnosis not present

## 2020-05-14 DIAGNOSIS — E119 Type 2 diabetes mellitus without complications: Secondary | ICD-10-CM | POA: Diagnosis not present

## 2020-05-14 DIAGNOSIS — Z7901 Long term (current) use of anticoagulants: Secondary | ICD-10-CM | POA: Diagnosis not present

## 2020-05-14 DIAGNOSIS — N189 Chronic kidney disease, unspecified: Secondary | ICD-10-CM | POA: Insufficient documentation

## 2020-05-14 DIAGNOSIS — D45 Polycythemia vera: Secondary | ICD-10-CM | POA: Diagnosis present

## 2020-05-14 DIAGNOSIS — E785 Hyperlipidemia, unspecified: Secondary | ICD-10-CM | POA: Diagnosis not present

## 2020-05-14 DIAGNOSIS — Z87891 Personal history of nicotine dependence: Secondary | ICD-10-CM | POA: Insufficient documentation

## 2020-05-14 LAB — COMPREHENSIVE METABOLIC PANEL
ALT: 42 U/L (ref 0–44)
AST: 39 U/L (ref 15–41)
Albumin: 3.5 g/dL (ref 3.5–5.0)
Alkaline Phosphatase: 104 U/L (ref 38–126)
Anion gap: 9 (ref 5–15)
BUN: 29 mg/dL — ABNORMAL HIGH (ref 8–23)
CO2: 25 mmol/L (ref 22–32)
Calcium: 9.3 mg/dL (ref 8.9–10.3)
Chloride: 104 mmol/L (ref 98–111)
Creatinine, Ser: 1.16 mg/dL — ABNORMAL HIGH (ref 0.44–1.00)
GFR calc Af Amer: 53 mL/min — ABNORMAL LOW (ref >60)
GFR calc non Af Amer: 45 mL/min — ABNORMAL LOW (ref >60)
Glucose, Bld: 292 mg/dL — ABNORMAL HIGH (ref 70–99)
Potassium: 3.5 mmol/L (ref 3.5–5.1)
Sodium: 138 mmol/L (ref 135–145)
Total Bilirubin: 0.8 mg/dL (ref 0.3–1.2)
Total Protein: 6.3 g/dL — ABNORMAL LOW (ref 6.5–8.1)

## 2020-05-14 LAB — CBC WITH DIFFERENTIAL/PLATELET
Abs Immature Granulocytes: 0.08 10*3/uL — ABNORMAL HIGH (ref 0.00–0.07)
Basophils Absolute: 0.1 10*3/uL (ref 0.0–0.1)
Basophils Relative: 1 %
Eosinophils Absolute: 0.3 10*3/uL (ref 0.0–0.5)
Eosinophils Relative: 3 %
HCT: 38.2 % (ref 36.0–46.0)
Hemoglobin: 13.2 g/dL (ref 12.0–15.0)
Immature Granulocytes: 1 %
Lymphocytes Relative: 5 %
Lymphs Abs: 0.6 10*3/uL — ABNORMAL LOW (ref 0.7–4.0)
MCH: 33.2 pg (ref 26.0–34.0)
MCHC: 34.6 g/dL (ref 30.0–36.0)
MCV: 96 fL (ref 80.0–100.0)
Monocytes Absolute: 0.9 10*3/uL (ref 0.1–1.0)
Monocytes Relative: 8 %
Neutro Abs: 9.3 10*3/uL — ABNORMAL HIGH (ref 1.7–7.7)
Neutrophils Relative %: 82 %
Platelets: 359 10*3/uL (ref 150–400)
RBC: 3.98 MIL/uL (ref 3.87–5.11)
RDW: 15 % (ref 11.5–15.5)
WBC: 11.3 10*3/uL — ABNORMAL HIGH (ref 4.0–10.5)
nRBC: 0 % (ref 0.0–0.2)

## 2020-05-14 NOTE — Telephone Encounter (Signed)
Spoke with patient regarding recent lab results(Blood sugar) Patient did state that she had not taken her medication. Patient also stated that she is taking a medication for an intestine infection and she read that one of the side affects to this medication is blood sugar increase. Labs have been routed to PCP.

## 2020-05-21 ENCOUNTER — Telehealth: Payer: Self-pay

## 2020-05-21 NOTE — Telephone Encounter (Signed)
Confirmed appointment on 05/25/2020 and screened for covid. klh 

## 2020-05-21 NOTE — Procedures (Signed)
[facility] [place], [address]  DATE OF SERVICE: May 06, 2020  Complete Pulmonary Function Testing Interpretation:  FINDINGS:  Forced vital capacity is mildly decreased.  FEV1 is 1.57 L which is 71% of predicted and is mildly decreased.  FEV1 FVC ratio is mildly decreased.  Postbronchodilator there is no significant improvement in the FEV1 however clinical improvement may occur in the absence of spirometric improvement.  Total lung capacity is normal residual volume is normal residual volume total lung capacity ratio is increased.  DLCO is mildly decreased.  DLCO corrected for alveolar volume is normal.  IMPRESSION:  This pulmonary function study is consistent with mild obstructive lung disease.  There does not appear to be significant bronchodilator response and DLCO was also decreased.  Allyne Gee, MD [facility] Pulmonary Critical Care Medicine Sleep Medicine

## 2020-05-25 ENCOUNTER — Ambulatory Visit (INDEPENDENT_AMBULATORY_CARE_PROVIDER_SITE_OTHER): Payer: Medicare Other | Admitting: Internal Medicine

## 2020-05-25 ENCOUNTER — Telehealth: Payer: Self-pay

## 2020-05-25 ENCOUNTER — Other Ambulatory Visit: Payer: Self-pay

## 2020-05-25 ENCOUNTER — Ambulatory Visit: Payer: PRIVATE HEALTH INSURANCE | Admitting: Internal Medicine

## 2020-05-25 VITALS — BP 124/61 | HR 100 | Temp 97.3°F | Resp 16 | Ht 67.0 in | Wt 231.6 lb

## 2020-05-25 DIAGNOSIS — J301 Allergic rhinitis due to pollen: Secondary | ICD-10-CM

## 2020-05-25 NOTE — Telephone Encounter (Signed)
Per patient after allergy test she will not go forward with injections and maintain allergy symptoms with otc meds. Angelica Juarez

## 2020-05-25 NOTE — Procedures (Signed)
    [facility] Allergy MQT Recording Form  [place] [address] [place], [place] [address] Phone [phone] Fax [phone]   Patient Name: Angelica Juarez Age: 79 y.o. Sex: female Date of Service: @SERVICEDATE @   Performing Provider: Allyne Gee MD [facility]         Battery A Back   Site Antigen [facility] Flare  A1 Positive Control 7 5  A2 Negative Control 0 0  A3 American Elm 0 0  A4 Maple Box Elder 0 0  A5 Grass Mix 0 0  A6 Dock Sorrel Mix 0 0  A7 Russian Thistle 0 0  A8 Ragweed Mix 0 0  A9 English Pantain 0 0  A10 Oak Mix  0 0   Battery B Wheal Flare  B1 Lambs Quarters 0 0  B2 Cottonwood 0 0  B3 Pigweed Mix 0 0  B4 Acacia 0 0  B5 Pine Mix 9 10  B6 Privet 0 0  B7 White/Red Mulberry 0 0  B8 Western Water Hemp 0 0  B9 Guatemala Grass 0 0  B10 Melalucea 0 0   Battery C Wheal Flare  C1 Red River Birch 0 0  C2 Eastern Sycamore 0 0  C3 Bahai Grass 0 0  C4 American Beech 0 0  C5 Ash Mix 0 0  C6 Black Willow 0 0  C7 Hickory 0 0  C8 Black Walnut 0 0  C9 Red Cedar 0 0  C10 Sweet Gum  0 0   Battery D Wheal Flare  D1 Cultivated Oat 0 0  D2 Dog Fennel 0 0  D3 Common Mugwort 0 0  D4 Marsh Elder 0 0  D5 Johnson 0 0  D6 Hackberry Tree 0 0  D7 Bayberry Tree 0 0  D8 Cypress, Bald Tree 0 0  D9 Aspergillus Fumigatus 7 9  D10 Alternia  0 0   Battery E Wheal Flare  E1 Dreschlere 0 0  E2 Fusarium Mix 0 0  E3 Cladosporum Sph 0 0  E4 Bipolaris 0 0  E5 Penicillin chrys 7 9  E6 Cladosporum Herb 0 0  E7 Candida 0 0  E8 Aureobasidium 0 0  E9 Rhizopus 0 0  E10 Botrytis  0 0   Battery F Wheal Flare  F1 Aspergillus [place] 0 0  F2 Dust Mite Mix 0 0  F3 Cockroach Mix 0 0  F4 Cat Hair 0 0  F5 Dog Mixed breeds 0 0  F6 Feather Mix 7 8

## 2020-05-28 ENCOUNTER — Other Ambulatory Visit: Payer: Self-pay

## 2020-05-28 ENCOUNTER — Other Ambulatory Visit
Admission: RE | Admit: 2020-05-28 | Discharge: 2020-05-28 | Disposition: A | Discharge status: Home / Self Care | Payer: Medicare Other | Source: Ambulatory Visit | Attending: Gastroenterology | Admitting: Gastroenterology

## 2020-05-28 DIAGNOSIS — Z20822 Contact with and (suspected) exposure to covid-19: Secondary | ICD-10-CM | POA: Diagnosis present

## 2020-05-28 LAB — SARS CORONAVIRUS 2 (TAT 6-24 HRS): SARS Coronavirus 2: NEGATIVE

## 2020-06-01 ENCOUNTER — Encounter: Payer: Self-pay | Admitting: Gastroenterology

## 2020-06-01 ENCOUNTER — Ambulatory Visit: Payer: Medicare Other | Admitting: Anesthesiology

## 2020-06-01 ENCOUNTER — Ambulatory Visit
Admission: RE | Admit: 2020-06-01 | Discharge: 2020-06-01 | Disposition: A | Discharge status: Home / Self Care | Payer: Medicare Other | Attending: Gastroenterology | Admitting: Gastroenterology

## 2020-06-01 ENCOUNTER — Encounter
Admission: RE | Disposition: A | Discharge status: Home / Self Care | Payer: Self-pay | Source: Home / Self Care | Attending: Gastroenterology

## 2020-06-01 ENCOUNTER — Other Ambulatory Visit: Payer: Self-pay

## 2020-06-01 DIAGNOSIS — Z87442 Personal history of urinary calculi: Secondary | ICD-10-CM | POA: Insufficient documentation

## 2020-06-01 DIAGNOSIS — Z91040 Latex allergy status: Secondary | ICD-10-CM | POA: Insufficient documentation

## 2020-06-01 DIAGNOSIS — Z7982 Long term (current) use of aspirin: Secondary | ICD-10-CM | POA: Insufficient documentation

## 2020-06-01 DIAGNOSIS — Z90710 Acquired absence of both cervix and uterus: Secondary | ICD-10-CM | POA: Diagnosis not present

## 2020-06-01 DIAGNOSIS — K635 Polyp of colon: Secondary | ICD-10-CM

## 2020-06-01 DIAGNOSIS — D175 Benign lipomatous neoplasm of intra-abdominal organs: Secondary | ICD-10-CM | POA: Insufficient documentation

## 2020-06-01 DIAGNOSIS — Z86010 Personal history of colonic polyps: Secondary | ICD-10-CM | POA: Insufficient documentation

## 2020-06-01 DIAGNOSIS — K6389 Other specified diseases of intestine: Secondary | ICD-10-CM

## 2020-06-01 DIAGNOSIS — R197 Diarrhea, unspecified: Secondary | ICD-10-CM | POA: Diagnosis not present

## 2020-06-01 DIAGNOSIS — Z794 Long term (current) use of insulin: Secondary | ICD-10-CM | POA: Diagnosis not present

## 2020-06-01 DIAGNOSIS — Z91048 Other nonmedicinal substance allergy status: Secondary | ICD-10-CM | POA: Insufficient documentation

## 2020-06-01 DIAGNOSIS — N189 Chronic kidney disease, unspecified: Secondary | ICD-10-CM | POA: Insufficient documentation

## 2020-06-01 DIAGNOSIS — D123 Benign neoplasm of transverse colon: Secondary | ICD-10-CM | POA: Diagnosis not present

## 2020-06-01 DIAGNOSIS — K529 Noninfective gastroenteritis and colitis, unspecified: Secondary | ICD-10-CM | POA: Diagnosis not present

## 2020-06-01 DIAGNOSIS — J449 Chronic obstructive pulmonary disease, unspecified: Secondary | ICD-10-CM | POA: Insufficient documentation

## 2020-06-01 DIAGNOSIS — Z96653 Presence of artificial knee joint, bilateral: Secondary | ICD-10-CM | POA: Diagnosis not present

## 2020-06-01 DIAGNOSIS — K644 Residual hemorrhoidal skin tags: Secondary | ICD-10-CM | POA: Diagnosis not present

## 2020-06-01 DIAGNOSIS — R569 Unspecified convulsions: Secondary | ICD-10-CM | POA: Insufficient documentation

## 2020-06-01 DIAGNOSIS — Z7951 Long term (current) use of inhaled steroids: Secondary | ICD-10-CM | POA: Insufficient documentation

## 2020-06-01 DIAGNOSIS — K228 Other specified diseases of esophagus: Secondary | ICD-10-CM

## 2020-06-01 DIAGNOSIS — Z79899 Other long term (current) drug therapy: Secondary | ICD-10-CM | POA: Insufficient documentation

## 2020-06-01 DIAGNOSIS — Z888 Allergy status to other drugs, medicaments and biological substances status: Secondary | ICD-10-CM | POA: Insufficient documentation

## 2020-06-01 DIAGNOSIS — E1122 Type 2 diabetes mellitus with diabetic chronic kidney disease: Secondary | ICD-10-CM | POA: Diagnosis not present

## 2020-06-01 DIAGNOSIS — Z923 Personal history of irradiation: Secondary | ICD-10-CM | POA: Diagnosis not present

## 2020-06-01 DIAGNOSIS — R0602 Shortness of breath: Secondary | ICD-10-CM | POA: Insufficient documentation

## 2020-06-01 DIAGNOSIS — K5730 Diverticulosis of large intestine without perforation or abscess without bleeding: Secondary | ICD-10-CM | POA: Diagnosis not present

## 2020-06-01 DIAGNOSIS — Z90721 Acquired absence of ovaries, unilateral: Secondary | ICD-10-CM | POA: Insufficient documentation

## 2020-06-01 DIAGNOSIS — I129 Hypertensive chronic kidney disease with stage 1 through stage 4 chronic kidney disease, or unspecified chronic kidney disease: Secondary | ICD-10-CM | POA: Insufficient documentation

## 2020-06-01 DIAGNOSIS — D122 Benign neoplasm of ascending colon: Secondary | ICD-10-CM | POA: Insufficient documentation

## 2020-06-01 DIAGNOSIS — Z87891 Personal history of nicotine dependence: Secondary | ICD-10-CM | POA: Diagnosis not present

## 2020-06-01 DIAGNOSIS — Z833 Family history of diabetes mellitus: Secondary | ICD-10-CM | POA: Insufficient documentation

## 2020-06-01 DIAGNOSIS — Z8544 Personal history of malignant neoplasm of other female genital organs: Secondary | ICD-10-CM | POA: Diagnosis not present

## 2020-06-01 DIAGNOSIS — K5289 Other specified noninfective gastroenteritis and colitis: Secondary | ICD-10-CM | POA: Insufficient documentation

## 2020-06-01 DIAGNOSIS — E785 Hyperlipidemia, unspecified: Secondary | ICD-10-CM | POA: Diagnosis not present

## 2020-06-01 DIAGNOSIS — Z803 Family history of malignant neoplasm of breast: Secondary | ICD-10-CM | POA: Insufficient documentation

## 2020-06-01 DIAGNOSIS — Z9981 Dependence on supplemental oxygen: Secondary | ICD-10-CM | POA: Insufficient documentation

## 2020-06-01 DIAGNOSIS — G4730 Sleep apnea, unspecified: Secondary | ICD-10-CM | POA: Insufficient documentation

## 2020-06-01 DIAGNOSIS — M1990 Unspecified osteoarthritis, unspecified site: Secondary | ICD-10-CM | POA: Diagnosis not present

## 2020-06-01 DIAGNOSIS — Z8249 Family history of ischemic heart disease and other diseases of the circulatory system: Secondary | ICD-10-CM | POA: Insufficient documentation

## 2020-06-01 DIAGNOSIS — Z91041 Radiographic dye allergy status: Secondary | ICD-10-CM | POA: Insufficient documentation

## 2020-06-01 HISTORY — PX: ESOPHAGOGASTRODUODENOSCOPY (EGD) WITH PROPOFOL: SHX5813

## 2020-06-01 HISTORY — PX: COLONOSCOPY WITH PROPOFOL: SHX5780

## 2020-06-01 LAB — GLUCOSE, CAPILLARY: Glucose-Capillary: 155 mg/dL — ABNORMAL HIGH (ref 70–99)

## 2020-06-01 SURGERY — COLONOSCOPY WITH PROPOFOL
Anesthesia: General

## 2020-06-01 MED ORDER — PROPOFOL 10 MG/ML IV BOLUS
INTRAVENOUS | Status: DC | PRN
  Administered 2020-06-01: 80 mg via INTRAVENOUS

## 2020-06-01 MED ORDER — SODIUM CHLORIDE 0.9 % IV SOLN: INTRAVENOUS | Status: DC

## 2020-06-01 MED ORDER — PROPOFOL 500 MG/50ML IV EMUL
INTRAVENOUS | Status: DC | PRN
  Administered 2020-06-01: 120 ug/kg/min via INTRAVENOUS

## 2020-06-01 MED ORDER — SPOT INK MARKER SYRINGE KIT
PACK | SUBMUCOSAL | Status: DC | PRN
  Administered 2020-06-01: 3 mL via SUBMUCOSAL

## 2020-06-01 MED ORDER — PROPOFOL 10 MG/ML IV BOLUS
INTRAVENOUS | Status: AC
  Filled 2020-06-01: qty 20

## 2020-06-01 MED ORDER — APIXABAN 5 MG PO TABS: 5.0000 mg | ORAL_TABLET | Freq: Two times a day (BID) | ORAL | 0 refills | Status: DC

## 2020-06-01 MED ORDER — PROPOFOL 500 MG/50ML IV EMUL
INTRAVENOUS | Status: AC
  Filled 2020-06-01: qty 50

## 2020-06-01 MED ORDER — LIDOCAINE HCL (CARDIAC) PF 100 MG/5ML IV SOSY
PREFILLED_SYRINGE | INTRAVENOUS | Status: DC | PRN
  Administered 2020-06-01: 50 mg via INTRAVENOUS

## 2020-06-01 NOTE — Transfer of Care (Signed)
Immediate Anesthesia Transfer of Care Note  Patient: Angelica Juarez  Procedure(s) Performed: COLONOSCOPY WITH PROPOFOL (N/A ) ESOPHAGOGASTRODUODENOSCOPY (EGD) WITH PROPOFOL (N/A )  Patient Location: Endoscopy Unit  Anesthesia Type:General  Level of Consciousness: drowsy and patient cooperative  Airway & Oxygen Therapy: Patient Spontanous Breathing and Patient connected to nasal cannula oxygen  Post-op Assessment: Report given to RN and Post -op Vital signs reviewed and stable  Post vital signs: Reviewed and stable  Last Vitals:  Vitals Value Taken Time  BP 145/72 06/01/20 1142  Temp    Pulse 94 06/01/20 1143  Resp 26 06/01/20 1143  SpO2 97 % 06/01/20 1143  Vitals shown include unvalidated device data.  Last Pain:  Vitals:   06/01/20 0933  TempSrc: Temporal  PainSc: 0-No pain         Complications: No complications documented.

## 2020-06-01 NOTE — Anesthesia Preprocedure Evaluation (Signed)
Anesthesia Evaluation  Patient identified by MRN, date of birth, ID band Patient awake    Reviewed: Allergy & Precautions, H&P , NPO status , Patient's Chart, lab work & pertinent test results  History of Anesthesia Complications Negative for: history of anesthetic complications  Airway Mallampati: III  TM Distance: <3 FB Neck ROM: limited    Dental  (+) Upper Dentures, Lower Dentures   Pulmonary shortness of breath, asthma , sleep apnea and Continuous Positive Airway Pressure Ventilation , pneumonia, COPD, former smoker,    Pulmonary exam normal        Cardiovascular hypertension, (-) Past MI Normal cardiovascular exam     Neuro/Psych Seizures -, Well Controlled,  negative psych ROS   GI/Hepatic negative GI ROS, Neg liver ROS, neg GERD  ,  Endo/Other  diabetes, Type 2, Insulin Dependent  Renal/GU CRFRenal disease  negative genitourinary   Musculoskeletal  (+) Arthritis ,   Abdominal   Peds  Hematology negative hematology ROS (+)   Anesthesia Other Findings Past Medical History: No date: Allergy     Comment:  Seasonal No date: Arthritis No date: 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 No date: Chronic kidney disease     Comment:  chronic renal insufficiency 05/21/14: Colon polyps No date: COPD (chronic obstructive pulmonary disease) (HCC) No date: Diabetes mellitus without complication (HCC) No date: Dyspnea No date: Fracture closed, humerus, shaft     Comment:  right  No date: History of kidney stones     Comment:  40 years ago 04/30/14: Hyperlipidemia No date: Hypertension 12/29/15: Impingement syndrome of right shoulder No date: Personal history of radiation therapy No date: Pneumonia     Comment:  hx 04/21/16: Rotator cuff tear     Comment:  right No date: Seizures ([place]) No date: Sleep apnea  Past Surgical History: No date: ABDOMINAL HYSTERECTOMY No date: EXPLORATORY LAPAROTOMY      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 No date: EYE SURGERY     Comment:  bilateral cataracts No date: JOINT REPLACEMENT     Comment:  bilateral knee replacement No date: NASAL SEPTUM SURGERY No date: OOPHORECTOMY 2004: REPLACEMENT TOTAL KNEE; Bilateral 05/25/2016: SHOULDER ARTHROSCOPY WITH BICEPSTENOTOMY; Right     Comment:  Procedure: SHOULDER ARTHROSCOPY WITH BICEPSTENOTOMY;                Surgeon: Leanor Kail, MD;  Location: [facility] ORS;                Service: Orthopedics;  Laterality: Right; 05/25/2016: SHOULDER ARTHROSCOPY WITH DISTAL CLAVICLE RESECTION; Right     Comment:  Procedure: SHOULDER ARTHROSCOPY WITH DISTAL CLAVICLE               RESECTION;  Surgeon: Leanor Kail, MD;  Location: [facility]              ORS;  Service: Orthopedics;  Laterality: Right; 05/25/2016: SHOULDER ARTHROSCOPY WITH OPEN ROTATOR CUFF REPAIR; Right     Comment:  Procedure: SHOULDER ARTHROSCOPY WITH OPEN ROTATOR CUFF               REPAIR;  Surgeon: Leanor Kail, MD;  Location: [facility]               ORS;  Service: Orthopedics;  Laterality: Right; 05/25/2016: SUBACROMIAL DECOMPRESSION; Right     Comment:  Procedure: SUBACROMIAL DECOMPRESSION;  Surgeon: Leanor Kail, MD;  Location: [facility] ORS;  Service: Orthopedics;              Laterality: Right;  BMI    Body Mass Index: 36.29 kg/m      Reproductive/Obstetrics negative OB ROS                             Anesthesia Physical Anesthesia Plan  ASA: IV  Anesthesia Plan: General   Post-op Pain Management:    Induction: Intravenous  PONV Risk Score and Plan: Propofol infusion and TIVA  Airway Management Planned: Natural Airway and Nasal Cannula  Additional Equipment:   Intra-op Plan:   Post-operative Plan:   Informed Consent: I have reviewed the patients History and Physical, chart, labs and discussed the procedure including the risks, benefits and alternatives for the proposed anesthesia with the patient or  authorized representative who has indicated his/her understanding and acceptance.     Dental Advisory Given  Plan Discussed with: Anesthesiologist, CRNA and Surgeon  Anesthesia Plan Comments: (Patient consented for risks of anesthesia including but not limited to:  - adverse reactions to medications - risk of intubation if required - damage to eyes, teeth, lips or other oral mucosa - nerve damage due to positioning  - sore throat or hoarseness - Damage to heart, brain, nerves, lungs, other parts of body or loss of life  Patient voiced understanding.)        Anesthesia Quick Evaluation

## 2020-06-01 NOTE — Op Note (Addendum)
[facility] Gastroenterology Patient Name: Angelica Juarez Procedure Date: 06/01/2020 10:02 AM MRN: [id number] Account #: [id number] Date of Birth: 1941-11-09 Admit Type: Outpatient Age: 79 Room: [facility] ENDO ROOM 1 Gender: Female Note Status: Finalized Procedure:             Colonoscopy Indications:           Chronic diarrhea, Clinically significant diarrhea of                         unexplained origin, elevated fecal calprotectin levels Providers:             Lin Landsman MD, MD Referring MD:          Sofie Hartigan (Referring MD) Medicines:             Monitored Anesthesia Care Complications:         No immediate complications. Estimated blood loss:                         Minimal. Procedure:             Pre-Anesthesia Assessment:                        - Prior to the procedure, a History and Physical was                         performed, and patient medications and allergies were                         reviewed. The patient is competent. The risks and                         benefits of the procedure and the sedation options and                         risks were discussed with the patient. All questions                         were answered and informed consent was obtained.                         Patient identification and proposed procedure were                         verified by the physician, the nurse, the                         anesthesiologist, the anesthetist and the technician                         in the pre-procedure area in the procedure room in the                         endoscopy suite. Mental Status Examination: alert and                         oriented. Airway Examination: normal oropharyngeal  airway and neck mobility. Respiratory Examination:                         clear to auscultation. CV Examination: normal.                         Prophylactic Antibiotics: The patient does not require                          prophylactic antibiotics. Prior Anticoagulants: The                         patient has taken Eliquis (apixaban), last dose was 3                         days prior to procedure. ASA Grade Assessment: III - A                         patient with severe systemic disease. After reviewing                         the risks and benefits, the patient was deemed in                         satisfactory condition to undergo the procedure. The                         anesthesia plan was to use monitored anesthesia care                         (MAC). Immediately prior to administration of                         medications, the patient was re-assessed for adequacy                         to receive sedatives. The heart rate, respiratory                         rate, oxygen saturations, blood pressure, adequacy of                         pulmonary ventilation, and response to care were                         monitored throughout the procedure. The physical                         status of the patient was re-assessed after the                         procedure.                        After obtaining informed consent, the colonoscope was                         passed under direct vision. Throughout the procedure,  the patient's blood pressure, pulse, and oxygen                         saturations were monitored continuously. The                         Colonoscope was introduced through the anus and                         advanced to the the terminal ileum, with                         identification of the appendiceal orifice and IC                         valve. The colonoscopy was performed with difficulty                         due to significant looping and the patient's body                         habitus. Successful completion of the procedure was                         aided by applying abdominal pressure. The patient                         tolerated the  procedure well. The quality of the bowel                         preparation was evaluated using the BBPS [facility] Bowel                         Preparation Scale) with scores of: Right Colon = 3,                         Transverse Colon = 3 and Left Colon = 3 (entire mucosa                         seen well with no residual staining, small fragments                         of stool or opaque liquid). The total BBPS score                         equals 9. Findings:      The perianal and digital rectal examinations were normal. Pertinent       negatives include normal sphincter tone and no palpable rectal lesions.      The terminal ileum appeared normal.      A 20 mm polyp was found in the ascending colon. The polyp was       carpet-like and flat. Preparations were made for mucosal resection.       Chromoscopy with methylene blue was done to mark the borders of the       lesion. Eleview was injected to raise the lesion. Snare mucosal       resection was performed. Resection and retrieval were  complete. To       prevent bleeding after mucosal resection, four hemostatic clips were       successfully placed (MR conditional). There was no bleeding during, or       at the end, of the procedure.      A 3 mm polyp was found in the ascending colon. The polyp was sessile.       The polyp was removed with a cold biopsy forceps. Resection and       retrieval were complete.      A 5 mm polyp was found in the transverse colon. The polyp was sessile.       The polyp was removed with a cold snare. Resection and retrieval were       complete.      A 20 mm polyp was found in the transverse colon. The polyp was       carpet-like and sessile. Preparations were made for mucosal resection.       Chromoscopy with methylene blue was done to mark the borders of the       lesion. Eleview was injected to raise the lesion. Snare mucosal       resection was performed. Resection and retrieval were complete. To        prevent bleeding after mucosal resection, three hemostatic clips were       successfully placed (MR conditional). There was no bleeding during, or       at the end, of the procedure. Area was tattooed with an injection of       Spot (carbon black).      A patchy area of mildly erythematous mucosa was found in the descending       colon, in the transverse colon and in the ascending colon. Biopsies were       taken with a cold forceps for histology.      Multiple diverticula were found in the sigmoid colon.      Non-bleeding external hemorrhoids were found during retroflexion. The       hemorrhoids were medium-sized. Impression:            - The examined portion of the ileum was normal.                        - One 20 mm polyp in the ascending colon, removed with                         mucosal resection. Resected and retrieved. Clips (MR                         conditional) were placed.                        - One 3 mm polyp in the ascending colon, removed with                         a cold biopsy forceps. Resected and retrieved.                        - One 5 mm polyp in the transverse colon, removed with                         a cold snare. Resected and  retrieved.                        - One 20 mm polyp in the transverse colon, removed                         with mucosal resection. Resected and retrieved. Clips                         (MR conditional) were placed.                        - Erythematous mucosa in the descending colon, in the                         transverse colon and in the ascending colon. Biopsied.                        - Diverticulosis in the sigmoid colon.                        - Non-bleeding external hemorrhoids.                        - Mucosal resection was performed. Resection and                         retrieval were complete.                        - Mucosal resection was performed. Resection and                         retrieval were  complete. Recommendation:        - Discharge patient to home (with escort).                        - Resume previous diet today.                        - Continue present medications.                        - Await pathology results.                        - Repeat colonoscopy in 1-3 years for surveillance of                         multiple polyps.                        - Return to my office as previously scheduled.                        - Resume Eliquis (apixaban) at prior dose in 3 days.                         Refer to managing physician for further adjustment of  therapy. Procedure Code(s):     --- Professional ---                        [phone], Colonoscopy, flexible; with endoscopic mucosal                         resection                        45385, 61, Colonoscopy, flexible; with removal of                         tumor(s), polyp(s), or other lesion(s) by snare                         technique                        45380, 72, Colonoscopy, flexible; with biopsy, single                         or multiple Diagnosis Code(s):     --- Professional ---                        K64.4, Residual hemorrhoidal skin tags                        K63.5, Polyp of colon                        K63.89, Other specified diseases of intestine                        K52.9, Noninfective gastroenteritis and colitis,                         unspecified                        R19.7, Diarrhea, unspecified                        K57.30, Diverticulosis of large intestine without                         perforation or abscess without bleeding CPT copyright 2019 American Medical Association. All rights reserved. The codes documented in this report are preliminary and upon coder review may  be revised to meet current compliance requirements. Dr. Ulyess Mort Lin Landsman MD, MD 06/01/2020 11:37:20 AM This report has been signed electronically. Number of Addenda: 0 Note  Initiated On: 06/01/2020 10:02 AM Scope Withdrawal Time: 0 hours 50 minutes 31 seconds  Total Procedure Duration: 0 hours 54 minutes 55 seconds  Estimated Blood Loss:  Estimated blood loss was minimal.      [facility]

## 2020-06-01 NOTE — Op Note (Addendum)
[facility] Gastroenterology Patient Name: Angelica Juarez Procedure Date: 06/01/2020 10:02 AM MRN: [id number] Account #: [id number] Date of Birth: 1941/08/22 Admit Type: Outpatient Age: 79 Room: [facility] ENDO ROOM 1 Gender: Female Note Status: Finalized Procedure:             Upper GI endoscopy Indications:           Diarrhea Providers:             Lin Landsman MD, MD Referring MD:          Sofie Hartigan (Referring MD) Medicines:             Monitored Anesthesia Care Complications:         No immediate complications. Estimated blood loss:                         Minimal. Procedure:             Pre-Anesthesia Assessment:                        - Prior to the procedure, a History and Physical was                         performed, and patient medications and allergies were                         reviewed. The patient is competent. The risks and                         benefits of the procedure and the sedation options and                         risks were discussed with the patient. All questions                         were answered and informed consent was obtained.                         Patient identification and proposed procedure were                         verified by the physician, the nurse, the                         anesthesiologist, the anesthetist and the technician                         in the pre-procedure area in the procedure room in the                         endoscopy suite. Mental Status Examination: alert and                         oriented. Airway Examination: normal oropharyngeal                         airway and neck mobility. Respiratory Examination:  clear to auscultation. CV Examination: normal.                         Prophylactic Antibiotics: The patient does not require                         prophylactic antibiotics. Prior Anticoagulants: The                         patient has taken Eliquis  (apixaban), last dose was 3                         days prior to procedure. ASA Grade Assessment: III - A                         patient with severe systemic disease. After reviewing                         the risks and benefits, the patient was deemed in                         satisfactory condition to undergo the procedure. The                         anesthesia plan was to use monitored anesthesia care                         (MAC). Immediately prior to administration of                         medications, the patient was re-assessed for adequacy                         to receive sedatives. The heart rate, respiratory                         rate, oxygen saturations, blood pressure, adequacy of                         pulmonary ventilation, and response to care were                         monitored throughout the procedure. The physical                         status of the patient was re-assessed after the                         procedure.                        After obtaining informed consent, the endoscope was                         passed under direct vision. Throughout the procedure,                         the patient's blood pressure, pulse, and oxygen  saturations were monitored continuously. The Endoscope                         was introduced through the mouth, and advanced to the                         second part of duodenum. The upper GI endoscopy was                         accomplished without difficulty. The patient tolerated                         the procedure well. 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       Biopsies were taken with a cold forceps for histology.      The duodenal bulb and second portion of the duodenum were normal.       Biopsies for histology were taken with a cold forceps for evaluation of       celiac disease.      The entire examined stomach was normal.      The  cardia and gastric fundus were normal on retroflexion.      Esophagogastric landmarks were identified: the gastroesophageal junction       was found at 42 cm from the incisors.      The Z-line was variable and was found 42 cm from the incisors. Biopsies       were taken with a cold forceps for histology.      The examined esophagus was normal. Impression:            - Duodenal lipoma. Biopsied.                        - Normal duodenal bulb and second portion of the                         duodenum. Biopsied.                        - Normal stomach.                        - Esophagogastric landmarks identified.                        - Z-line variable, 42 cm from the incisors. Biopsied.                        - Normal esophagus. Recommendation:        - Await pathology results.                        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 All rights reserved. The codes documented in this report are preliminary and upon coder review may  be revised to meet current compliance requirements. Dr. Ulyess Mort Lin Landsman MD, MD 06/01/2020 10:33:27 AM This report has been signed electronically. Number of Addenda: 0 Note Initiated On: 06/01/2020 10:02 AM Estimated Blood Loss:  Estimated blood loss was minimal.      [facility]

## 2020-06-01 NOTE — H&P (Signed)
Angelica Darby, MD [address]  [place]  [place], [place] [address]  Main: [phone]  Fax: [phone] Pager: [phone]  Primary Care Physician:  Sofie Hartigan, MD Primary Gastroenterologist:  Dr. Cephas Juarez  Pre-Procedure History & Physical: HPI:  Angelica Juarez is a 79 y.o. female is here for an endoscopy and colonoscopy.   Past Medical History:  Diagnosis Date  . Allergy    Seasonal  . Arthritis   . 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 Chronic kidney disease    chronic renal insufficiency  . Colon polyps 05/21/14  . COPD (chronic obstructive pulmonary disease) ([place])   . Diabetes mellitus without complication ([place])   . Dyspnea   . Fracture closed, humerus, shaft    right   . History of kidney stones    40 years ago  . Hyperlipidemia 04/30/14  . Hypertension   . Impingement syndrome of right shoulder 12/29/15  . Personal history of radiation therapy   . Pneumonia    hx  . Rotator cuff tear 04/21/16   right  . Seizures ([place])   . Sleep apnea     Past Surgical History:  Procedure Laterality Date  . ABDOMINAL HYSTERECTOMY    . 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 EYE SURGERY     bilateral cataracts  . JOINT REPLACEMENT     bilateral knee replacement  . NASAL SEPTUM SURGERY    . OOPHORECTOMY    . REPLACEMENT TOTAL KNEE Bilateral 2004  . SHOULDER ARTHROSCOPY WITH BICEPSTENOTOMY Right 05/25/2016   Procedure: SHOULDER ARTHROSCOPY WITH BICEPSTENOTOMY;  Surgeon: Leanor Kail, MD;  Location: [facility] ORS;  Service: Orthopedics;  Laterality: Right;  . SHOULDER ARTHROSCOPY WITH DISTAL CLAVICLE RESECTION Right 05/25/2016   Procedure: SHOULDER ARTHROSCOPY WITH DISTAL CLAVICLE RESECTION;  Surgeon: Leanor Kail, MD;  Location: [facility] ORS;  Service: Orthopedics;  Laterality: Right;  . SHOULDER ARTHROSCOPY WITH OPEN ROTATOR CUFF REPAIR Right 05/25/2016   Procedure: SHOULDER ARTHROSCOPY WITH OPEN ROTATOR CUFF REPAIR;  Surgeon: Leanor Kail, MD;  Location: [facility] ORS;  Service: Orthopedics;  Laterality: Right;  . SUBACROMIAL DECOMPRESSION Right 05/25/2016   Procedure: SUBACROMIAL DECOMPRESSION;  Surgeon: Leanor Kail, MD;  Location: [facility] ORS;  Service: Orthopedics;  Laterality: Right;    Prior to Admission medications   Medication Sig Start Date End Date Taking? Authorizing Provider  aspirin 81 MG tablet Take 81 mg by mouth daily.   Yes [provider]  atorvastatin (LIPITOR) 40 MG tablet Take 40 mg by mouth daily. 04/04/16  Yes [provider]  B Complex Vitamins (VITAMIN B COMPLEX PO) Take 1 tablet by mouth daily.   Yes [provider]  fluticasone (FLONASE) 50 MCG/ACT nasal spray Place 2 sprays into both nostrils daily. 08/08/19  Yes Scarboro, Audie Clear, NP  Fluticasone-Umeclidin-Vilant (TRELEGY ELLIPTA) 100-62.5-25 MCG/INH AEPB Inhale 1 puff into the lungs daily. 08/16/18  Yes Allyne Gee, MD  furosemide (LASIX) 20 MG tablet Take 1 tablet (20 mg total) by mouth 2 (two) times daily. 08/08/16  Yes Demetrios Loll, MD  hydrALAZINE (APRESOLINE) 100 MG tablet Take 50 mg by mouth 2 (two) times daily.  02/04/16  Yes [provider]  hydroxyurea (HYDREA) 500 MG capsule Take 1 pill twice a day on Mondays only and 1 pill a day by mouth on Tuesdays-Sundays. May take with food to minimize GI side effects. 03/19/20  Yes Lequita Asal, MD  levETIRAcetam (KEPPRA) 750  MG tablet Take 750 mg by mouth 2 (two) times daily. 03/14/16  Yes [provider]  mesalamine (LIALDA) 1.2 g EC tablet Take 2 tablets (2.4 g total) by mouth 2 (two) times daily. 05/08/20 06/07/20 Yes Tuck Dulworth, Tally Due, MD  metFORMIN (GLUCOPHAGE) 500 MG tablet Take 500 mg by mouth 2 (two) times daily with a meal. 12/25/15  Yes [provider]  Multiple Vitamin (MULTI-VITAMINS) TABS Take 1 tablet by mouth daily.   Yes [provider]  NIFEdipine (NIFEDICAL XL) 30 MG 24 hr tablet Take 30 mg by mouth daily. 09/30/15  Yes  [provider]  sitaGLIPtin (JANUVIA) 25 MG tablet Take 1 tablet by mouth daily. 01/01/18  Yes [provider]  umeclidinium-vilanterol (ANORO ELLIPTA) 62.5-25 MCG/INH AEPB Inhale 1 puff into the lungs daily. 02/06/20  Yes Scarboro, Audie Clear, NP  valsartan-hydrochlorothiazide (DIOVAN-HCT) 320-25 MG tablet Take 1 tablet by mouth daily. 04/14/16  Yes [provider]  VENTOLIN HFA 108 (90 Base) MCG/ACT inhaler INHALE 2 PUFFS INTO THE LUNGS EVERY 6 HOURS AS NEEDED Patient taking differently: Inhale 2 puffs into the lungs every 6 (six) hours as needed for wheezing or shortness of breath.  02/18/19  Yes Scarboro, Audie Clear, NP  vitamin C (ASCORBIC ACID) 500 MG tablet Take 500 mg by mouth 2 (two) times daily.   Yes [provider]  albuterol (ACCUNEB) 1.25 MG/3ML nebulizer solution Take 3 mLs (1.25 mg total) by nebulization every 6 (six) hours as needed. wheezing 05/03/19   Allyne Gee, MD  apixaban (ELIQUIS) 5 MG TABS tablet Take 1 tablet (5 mg total) by mouth 2 (two) times daily. 06/03/19   Vaughan Basta, MD  diphenhydrAMINE (BENADRYL) 25 mg capsule Take 25 mg by mouth daily.    [provider]  EPINEPHrine 0.3 mg/0.3 mL IJ SOAJ injection Inject 0.3 mg into the muscle as needed for anaphylaxis.  01/24/19   [provider]  montelukast (SINGULAIR) 10 MG tablet TAKE 1 TABLET BY MOUTH EVERY DAY Patient taking differently: Take 10 mg by mouth daily.  07/02/15   Kathrine Haddock, NP  OVER THE COUNTER MEDICATION Place 1 drop into both eyes daily. Allergy eye drops    [provider]  OXYGEN Inhale 2 L into the lungs.    [provider]    Allergies as of 04/28/2020 - Review Complete 04/28/2020  Allergen Reaction Noted  . Iodinated diagnostic agents Anaphylaxis 10/02/2012  . Latex Itching 05/13/2016  . Phenobarbital Hives 05/12/2016  . Tape Rash 05/12/2016    Family History  Problem Relation Age of Onset  . Breast cancer Paternal Aunt  73  . Diabetes Father   . Heart disease Father   . Breast cancer Daughter 22    Social History   Socioeconomic History  . Marital status: Single    Spouse name: Not on file  . Number of children: Not on file  . Years of education: Not on file  . Highest education level: Not on file  Occupational History  . Not on file  Tobacco Use  . Smoking status: Former Smoker    Packs/day: 1.00    Years: 40.00    Pack years: 40.00    Quit date: 05/12/2005    Years since quitting: 15.0  . Smokeless tobacco: Never Used  Vaping Use  . Vaping Use: Never used  Substance and Sexual Activity  . Alcohol use: No  . Drug use: No  . Sexual activity: Not on file  Other Topics  Concern  . Not on file  Social History Narrative  . Not on file   Social Determinants of Health   Financial Resource Strain:   . Difficulty of Paying Living Expenses:   Food Insecurity:   . Worried About Charity fundraiser in the Last Year:   . Arboriculturist in the Last Year:   Transportation Needs:   . Film/video editor (Medical):   Marland Kitchen Lack of Transportation (Non-Medical):   Physical Activity:   . Days of Exercise per Week:   . Minutes of Exercise per Session:   Stress:   . Feeling of Stress :   Social Connections:   . Frequency of Communication with Friends and Family:   . Frequency of Social Gatherings with Friends and Family:   . Attends Religious Services:   . Active Member of Clubs or Organizations:   . Attends Archivist Meetings:   Marland Kitchen Marital Status:   Intimate Partner Violence:   . Fear of Current or Ex-Partner:   . Emotionally Abused:   Marland Kitchen Physically Abused:   . Sexually Abused:     Review of Systems: See HPI, otherwise negative ROS  Physical Exam: BP (!) 180/71   Pulse 84   Temp 97.9 F (36.6 C) (Temporal)   Resp (!) 22   Ht 5\' 7"  (1.702 m)   Wt 105.1 kg   SpO2 98%   BMI 36.29 kg/m  General:   Alert,  pleasant and cooperative in NAD Head:  Normocephalic and  atraumatic. Neck:  Supple; no masses or thyromegaly. Lungs:  Clear throughout to auscultation.    Heart:  Regular rate and rhythm. Abdomen:  Soft, nontender and nondistended. Normal bowel sounds, without guarding, and without rebound.   Neurologic:  Alert and  oriented x4;  grossly normal neurologically.  Impression/Plan: Sander Nephew is here for an endoscopy and colonoscopy to be performed for chronic diarrhea, elevated fecal calprotectin levels  Risks, benefits, limitations, and alternatives regarding  endoscopy and colonoscopy have been reviewed with the patient.  Questions have been answered.  All parties agreeable.   Sherri Sear, MD  06/01/2020, 9:58 AM

## 2020-06-01 NOTE — Anesthesia Postprocedure Evaluation (Signed)
Anesthesia Post Note  Patient: Angelica Juarez  Procedure(s) Performed: COLONOSCOPY WITH PROPOFOL (N/A ) ESOPHAGOGASTRODUODENOSCOPY (EGD) WITH PROPOFOL (N/A )  Patient location during evaluation: Endoscopy Anesthesia Type: General Level of consciousness: awake and alert Pain management: pain level controlled Vital Signs Assessment: post-procedure vital signs reviewed and stable Respiratory status: spontaneous breathing, nonlabored ventilation, respiratory function stable and patient connected to nasal cannula oxygen Cardiovascular status: blood pressure returned to baseline and stable Postop Assessment: no apparent nausea or vomiting Anesthetic complications: no   No complications documented.   Last Vitals:  Vitals:   06/01/20 1200 06/01/20 1210  BP: (!) 149/91 (!) 149/91  Pulse: 86 82  Resp: 20 19  Temp:    SpO2: 100% 98%    Last Pain:  Vitals:   06/01/20 0933  TempSrc: Temporal  PainSc: 0-No pain                 Precious Haws Marna Weniger

## 2020-06-02 ENCOUNTER — Encounter: Payer: Self-pay | Admitting: Gastroenterology

## 2020-06-02 LAB — SURGICAL PATHOLOGY

## 2020-06-03 ENCOUNTER — Telehealth: Payer: Self-pay

## 2020-06-03 NOTE — Telephone Encounter (Signed)
-----   Message from Lin Landsman, MD sent at 06/03/2020  3:05 PM EDT ----- Attempted to call patient regarding pathology results.  She did not answer.  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  Decrease Lialda to 2 pills a day.  I should see her for follow-up in 4 to 6 weeksRohini Vanga

## 2020-06-03 NOTE — Telephone Encounter (Signed)
Called and left a message for call back  

## 2020-06-04 MED ORDER — BUDESONIDE 3 MG PO CPEP: 9.0000 mg | ORAL_CAPSULE | Freq: Every day | ORAL | 2 refills | Status: DC

## 2020-06-04 NOTE — Telephone Encounter (Signed)
Patient verbalized understanding of results. She said to send medication to [facility] in [place] and made follow up appointment. Sent RX to pharmacy

## 2020-06-09 ENCOUNTER — Other Ambulatory Visit: Payer: Self-pay

## 2020-06-09 DIAGNOSIS — D45 Polycythemia vera: Secondary | ICD-10-CM

## 2020-06-10 ENCOUNTER — Other Ambulatory Visit: Payer: Self-pay

## 2020-06-10 ENCOUNTER — Encounter: Payer: Self-pay | Admitting: Hematology and Oncology

## 2020-06-10 NOTE — Progress Notes (Signed)
[facility]  [address] [place], [place] [address] Phone: [phone]  Fax: [phone]   Clinic Day:  06/11/2020  Referring physician: Sofie Hartigan, MD  Chief Complaint: Angelica Juarez is a 79 y.o. 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   HPI: The patient was last seen in the hematology clinic on 03/19/2020 via telephone. At that time, she felt okay.  She denied any fevers, sweats or weight loss.  She had no adenopathy or hepatosplenomegaly. Hematocrit was 40.8, hemoglobin 14.3, platelets 433,000, WBC 13,800 (ANC 11,300). CrCl was 60 ml/min.  Screening mammogram on 04/30/2020 revealed no evidence of malignancy.  Pulmonary function testing on 05/06/2020 was consistent with mild obstructive lung disease. There did not appear to be significant bronchodilator response and DLCO was also decreased.  The patient saw Dr. Marius Ditch on 04/28/2020 with diarrhea x 5 months. She had undergone stool studies which were negative for infection. She has a history of C. Difficile in 2020.   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 Colonoscopy on 06/01/2020 revealed erythematous mucosa in the descending, transverse, and ascending colon and diverticulosis in the sigmoid colon. There were non-bleeding external hemorrhoids. Four polyps were removed (tubular adenomas). Pathology revealed diffuse microscopic colitis.  Labs followed: 04/16/2020: Hematocrit 40.5, hemoglobin 14.0, platelets 330,000, WBC 11,300 (ANC 9,200). 05/14/2020: Hematocrit 38.2, hemoglobin 13.2, platelets 359,000, WBC 11,300 (ANC 9,300). Creatinine 1.16 (CrCl 45 ml/min).  During the interim, she has been "surviving".  She has had on and off diarrhea for awhile. She takes two medications for this, but they do not help. Her diet is okay. She has been lightheaded this morning and has not been sleeping well. She did not take her blood pressure medication this  morning.  The patient is currently on 2 or 3 liters/min oxygen. She uses a CPAP machine at night. She is no longer receiving allergy injections. She takes a daily multivitamin, Vitamin C, and a "hair skin and nails" vitamin. She states that one of her doctors told her to start drinking a probiotic drink.  She denies use of any herbal products.   Past Medical History:  Diagnosis Date  . Allergy    Seasonal  . Arthritis   . 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 Chronic kidney disease    chronic renal insufficiency  . Colon polyps 05/21/14  . COPD (chronic obstructive pulmonary disease) ([place])   . Diabetes mellitus without complication ([place])   . Dyspnea   . Fracture closed, humerus, shaft    right   . History of kidney stones    40 years ago  . Hyperlipidemia 04/30/14  . Hypertension   . Impingement syndrome of right shoulder 12/29/15  . Personal history of radiation therapy   . Pneumonia    hx  . Rotator cuff tear 04/21/16   right  . Seizures ([place])   . Sleep apnea     Past Surgical History:  Procedure Laterality Date  . ABDOMINAL HYSTERECTOMY    . COLONOSCOPY WITH PROPOFOL N/A 06/01/2020   Procedure: COLONOSCOPY WITH PROPOFOL;  Surgeon: Lin Landsman, MD;  Location: [facility] ENDOSCOPY;  Service: Gastroenterology;  Laterality: N/A;  . ESOPHAGOGASTRODUODENOSCOPY (EGD) WITH PROPOFOL N/A 06/01/2020   Procedure: ESOPHAGOGASTRODUODENOSCOPY (EGD) WITH PROPOFOL;  Surgeon: Lin Landsman, MD;  Location: [facility] ENDOSCOPY;  Service: Gastroenterology;  Laterality: N/A;  . 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 EYE SURGERY  bilateral cataracts  . JOINT REPLACEMENT     bilateral knee replacement  . NASAL SEPTUM SURGERY    . OOPHORECTOMY    . REPLACEMENT TOTAL KNEE Bilateral 2004  . SHOULDER ARTHROSCOPY WITH BICEPSTENOTOMY Right 05/25/2016   Procedure: SHOULDER ARTHROSCOPY WITH BICEPSTENOTOMY;  Surgeon: Leanor Kail, MD;  Location: [facility] ORS;  Service:  Orthopedics;  Laterality: Right;  . SHOULDER ARTHROSCOPY WITH DISTAL CLAVICLE RESECTION Right 05/25/2016   Procedure: SHOULDER ARTHROSCOPY WITH DISTAL CLAVICLE RESECTION;  Surgeon: Leanor Kail, MD;  Location: [facility] ORS;  Service: Orthopedics;  Laterality: Right;  . SHOULDER ARTHROSCOPY WITH OPEN ROTATOR CUFF REPAIR Right 05/25/2016   Procedure: SHOULDER ARTHROSCOPY WITH OPEN ROTATOR CUFF REPAIR;  Surgeon: Leanor Kail, MD;  Location: [facility] ORS;  Service: Orthopedics;  Laterality: Right;  . SUBACROMIAL DECOMPRESSION Right 05/25/2016   Procedure: SUBACROMIAL DECOMPRESSION;  Surgeon: Leanor Kail, MD;  Location: [facility] ORS;  Service: Orthopedics;  Laterality: Right;    Family History  Problem Relation Age of Onset  . Breast cancer Paternal Aunt 31  . Diabetes Father   . Heart disease Father   . Breast cancer Daughter 32    Social History:  reports that she quit smoking about 15 years ago. She has a 40.00 pack-year smoking history. She has never used smokeless tobacco. She reports that she does not drink alcohol and does not use drugs. Patient moved to [place] in 2001 from [place]. Patient is a former 1 ppd smoker x 40 years; quit in 2006. She is retired from [employer]. Patient denies known exposures to radiation on toxins. Her significant other is [employer]. She lives in [place]. She lost her daughter to a heart attack last month. The patient is alone today.   Allergies:  Allergies  Allergen Reactions  . Iodinated Diagnostic Agents Anaphylaxis    Other reaction(s): Other (See Comments) Throat swells and extreme hives  . Latex Itching  . Phenobarbital Hives  . Tape Rash    silicones    Current Medications: Current Outpatient Medications  Medication Sig Dispense Refill  . albuterol (ACCUNEB) 1.25 MG/3ML nebulizer solution Take 3 mLs (1.25 mg total) by nebulization every 6 (six) hours as needed. wheezing 75 mL 3  . apixaban (ELIQUIS) 5 MG TABS tablet Take 1 tablet (5 mg total) by mouth  2 (two) times daily. 60 tablet 0  . aspirin 81 MG tablet Take 81 mg by mouth daily.    Marland Kitchen atorvastatin (LIPITOR) 40 MG tablet Take 40 mg by mouth daily.    . B Complex Vitamins (VITAMIN B COMPLEX PO) Take 1 tablet by mouth daily.    . budesonide (ENTOCORT EC) 3 MG 24 hr capsule Take 3 capsules (9 mg total) by mouth daily. 90 capsule 2  . diphenhydrAMINE (BENADRYL) 25 mg capsule Take 25 mg by mouth daily.    . fluticasone (FLONASE) 50 MCG/ACT nasal spray Place 2 sprays into both nostrils daily. 16 g 2  . furosemide (LASIX) 20 MG tablet Take 1 tablet (20 mg total) by mouth 2 (two) times daily. 60 tablet 0  . hydrALAZINE (APRESOLINE) 100 MG tablet Take 50 mg by mouth 2 (two) times daily.     . hydroxyurea (HYDREA) 500 MG capsule Take 1 pill twice a day on Mondays only and 1 pill a day by mouth on Tuesdays-Sundays. May take with food to minimize GI side effects. 100 capsule 1  . levETIRAcetam (KEPPRA) 750 MG tablet Take 750 mg by mouth 2 (two) times daily.    Marland Kitchen  metFORMIN (GLUCOPHAGE) 500 MG tablet Take 500 mg by mouth 2 (two) times daily with a meal.    . montelukast (SINGULAIR) 10 MG tablet TAKE 1 TABLET BY MOUTH EVERY DAY (Patient taking differently: Take 10 mg by mouth daily. ) 90 tablet 0  . Multiple Vitamin (MULTI-VITAMINS) TABS Take 1 tablet by mouth daily.    Marland Kitchen NIFEdipine (NIFEDICAL XL) 30 MG 24 hr tablet Take 30 mg by mouth daily.    . OXYGEN Inhale 2 L into the lungs.    . sitaGLIPtin (JANUVIA) 25 MG tablet Take 1 tablet by mouth daily.    Marland Kitchen umeclidinium-vilanterol (ANORO ELLIPTA) 62.5-25 MCG/INH AEPB Inhale 1 puff into the lungs daily. 180 each 3  . valsartan-hydrochlorothiazide (DIOVAN-HCT) 320-25 MG tablet Take 1 tablet by mouth daily.  0  . VENTOLIN HFA 108 (90 Base) MCG/ACT inhaler INHALE 2 PUFFS INTO THE LUNGS EVERY 6 HOURS AS NEEDED (Patient taking differently: Inhale 2 puffs into the lungs every 6 (six) hours as needed for wheezing or shortness of breath. ) 54 g 1  . vitamin C  (ASCORBIC ACID) 500 MG tablet Take 500 mg by mouth 2 (two) times daily.    Marland Kitchen EPINEPHrine 0.3 mg/0.3 mL IJ SOAJ injection Inject 0.3 mg into the muscle as needed for anaphylaxis.  (Patient not taking: Reported on 06/10/2020)    . Fluticasone-Umeclidin-Vilant (TRELEGY ELLIPTA) 100-62.5-25 MCG/INH AEPB Inhale 1 puff into the lungs daily. (Patient not taking: Reported on 06/10/2020) 1 each 4  . mesalamine (LIALDA) 1.2 g EC tablet Take 2 tablets (2.4 g total) by mouth 2 (two) times daily. 120 tablet 1  . OVER THE COUNTER MEDICATION Place 1 drop into both eyes daily. Allergy eye drops (Patient not taking: Reported on 06/10/2020)     No current facility-administered medications for this visit.    Review of Systems  Constitutional: Negative for chills, diaphoresis, fever, malaise/fatigue and weight loss (stable).       Doing "ok". Using portable oxygen.  HENT: Negative.  Negative for congestion, ear discharge, ear pain, hearing loss, nosebleeds, sinus pain, sore throat and tinnitus.   Eyes: Negative.  Negative for blurred vision.       Macular degeneration left eye, receives injection.   Respiratory: Negative for cough, hemoptysis, sputum production and shortness of breath.        Sleep apnea - uses CPAP. COPD on 2-3 liters/min of oxygen.  Cardiovascular: Negative for chest pain, palpitations and leg swelling.  Gastrointestinal: Positive for diarrhea (on and off, on two meds). Negative for abdominal pain, blood in stool, constipation, heartburn, melena, nausea and vomiting.       Diet is okay.  Genitourinary: Negative.  Negative for dysuria, frequency, hematuria and urgency.  Musculoskeletal: Negative for back pain (DDD), falls, joint pain (OA in shoulders), myalgias and neck pain.  Skin: Negative for itching and rash.  Neurological: Positive for dizziness (lightheaded this morning). Negative for tremors, sensory change, speech change, weakness and headaches.  Endo/Heme/Allergies: Positive for  environmental allergies (seasonal). Does not bruise/bleed easily.  Psychiatric/Behavioral: Negative for depression and memory loss. The patient has insomnia. The patient is not nervous/anxious.   All other systems reviewed and are negative.  Performance status (ECOG): 1  Vitals Blood pressure (!) 137/60, pulse 80, temperature (!) 96.8 F (36 C), temperature source Tympanic, resp. rate 22, weight (!) 236 lb 15.9 oz (107.5 kg), SpO2 97 %.  Physical Exam Vitals and nursing note reviewed.  Constitutional:      General: She is  not in acute distress.    Appearance: She is well-developed. She is not diaphoretic.     Comments: Patient examined in chair due to dizziness.  HENT:     Head: Normocephalic and atraumatic.     Comments: Short gray hair.    Mouth/Throat:     Mouth: Mucous membranes are moist.     Pharynx: Oropharynx is clear.  Eyes:     General: No scleral icterus.    Extraocular Movements: Extraocular movements intact.     Conjunctiva/sclera: Conjunctivae normal.     Pupils: Pupils are equal, round, and reactive to light.     Comments: Glasses.  Brown eyes.  Cardiovascular:     Rate and Rhythm: Normal rate and regular rhythm.     Heart sounds: Normal heart sounds. No murmur heard.   Pulmonary:     Effort: Pulmonary effort is normal. No respiratory distress.     Breath sounds: Wheezing present.     Comments: Right lower lobe crackles. Chest:     Chest wall: No tenderness.  Abdominal:     General: Bowel sounds are normal. There is no distension.     Palpations: Abdomen is soft. There is no mass.     Tenderness: There is no abdominal tenderness. There is no guarding or rebound.  Musculoskeletal:        General: No swelling or tenderness. Normal range of motion.     Cervical back: Normal range of motion and neck supple.  Lymphadenopathy:     Head:     Right side of head: No preauricular, posterior auricular or occipital adenopathy.     Left side of head: No preauricular,  posterior auricular or occipital adenopathy.     Cervical: No cervical adenopathy.     Upper Body:     Right upper body: No supraclavicular or axillary adenopathy.     Left upper body: No supraclavicular or axillary adenopathy.     Lower Body: No right inguinal adenopathy. No left inguinal adenopathy.  Skin:    General: Skin is warm and dry.  Neurological:     Mental Status: She is alert and oriented to person, place, and time.  Psychiatric:        Behavior: Behavior normal.        Thought Content: Thought content normal.        Judgment: Judgment normal.     Appointment on 06/11/2020  Component Date Value Ref Range Status  . Sodium 06/11/2020 138  135 - 145 mmol/L Final  . Potassium 06/11/2020 3.5  3.5 - 5.1 mmol/L Final  . Chloride 06/11/2020 109  98 - 111 mmol/L Final  . CO2 06/11/2020 19* 22 - 32 mmol/L Final  . Glucose, Bld 06/11/2020 101* 70 - 99 mg/dL Final   Glucose reference range applies only to samples taken after fasting for at least 8 hours.  . BUN 06/11/2020 30* 8 - 23 mg/dL Final  . Creatinine, Ser 06/11/2020 1.08* 0.44 - 1.00 mg/dL Final  . Calcium 06/11/2020 9.3  8.9 - 10.3 mg/dL Final  . GFR calc non Af Amer 06/11/2020 49* >60 mL/min Final  . GFR calc Af Amer 06/11/2020 57* >60 mL/min Final  . Anion gap 06/11/2020 10  5 - 15 Final   Performed at [facility], [address] [place],  [address]  . WBC 06/11/2020 13.2* 4.0 - 10.5 K/uL Final  . RBC 06/11/2020 3.28* 3.87 - 5.11 MIL/uL Final  . Hemoglobin 06/11/2020 11.5* 12.0 -  15.0 g/dL Final  . HCT 06/11/2020 33.1* 36 - 46 % Final  . MCV 06/11/2020 100.9* 80.0 - 100.0 fL Final  . MCH 06/11/2020 35.1* 26.0 - 34.0 pg Final  . MCHC 06/11/2020 34.7  30.0 - 36.0 g/dL Final  . RDW 06/11/2020 15.8* 11.5 - 15.5 % Final  . Platelets 06/11/2020 366  150 - 400 K/uL Final  . nRBC 06/11/2020 0.0  0.0 - 0.2 % Final  . Neutrophils Relative % 06/11/2020 81  % Final  . Neutro Abs 06/11/2020 10.7* 1.7 -  7.7 K/uL Final  . Lymphocytes Relative 06/11/2020 5  % Final  . Lymphs Abs 06/11/2020 0.6* 0.7 - 4.0 K/uL Final  . Monocytes Relative 06/11/2020 9  % Final  . Monocytes Absolute 06/11/2020 1.2* 0 - 1 K/uL Final  . Eosinophils Relative 06/11/2020 2  % Final  . Eosinophils Absolute 06/11/2020 0.3  0 - 0 K/uL Final  . Basophils Relative 06/11/2020 1  % Final  . Basophils Absolute 06/11/2020 0.1  0 - 0 K/uL Final  . Immature Granulocytes 06/11/2020 2  % Final  . Abs Immature Granulocytes 06/11/2020 0.21* 0.00 - 0.07 K/uL Final   Performed at [facility], [address], [place], [place] [address]    Assessment:  HIRA TRENT is a 79 y.o. female withpolycythemia rubra vera.She has had sleep apnea x 7 years and uses CPAP. She has a 40 pack year smoking history (stopped 13 years ago). She denies and cardiac history.  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 There was no significant dysplasia or increased blasts. Overall features were c/w polycythemia rubra vera.  Work up on 08/08/2018 revealed a WBC of 16,700 ([place] 13,800). Hemoglobin 15.3, hematocrit 45.5, MCV 87.9, and platelets 552,000. Erythropoietin level was normal at 2.9 mIU/mL. BCR/ABL demonstrated no BCR or ABL gene rearrangements. Carbon monoxide level was normal at 3.0% (0-3.6 %). 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   She began a phlebotomy programon 09/07/2018 (last04/30/2020). Hematocrit goalis <=42.  She began hydroxyurea500 mg a day on 09/12/2018.  She was diagnosed with C difficile + diarrheaon 10/10/2018; completed 10 day course of oral vancomycin. 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  Colonoscopy on 06/01/2020 revealed erythematous mucosa in the descending, transverse, and ascending colon and diverticulosis in the  sigmoid colon. There were non-bleeding external hemorrhoids. Four polyps were removed (tubular adenomas). Pathology revealed diffuse microscopic colitis.  She received her first COVID-19 vaccine on 02/28/2020 and last COVID-19 vaccine 2 weeks ago  Symptomatically, she feels lightheaded today.  Exam reveals right lower lobe crackles and some wheezing.  Plan: 1.   Labs today: CBC with diff, CMP. 2.Polycythemia rubra vera Hematocrit33.1. Hemoglobin11.5.Platelets366,000. Hematocrit goal <= 42. Platelet goal< 400,000. No phlebotomy today. Continue hydroxyurea 500 mg a day. 3.   Macrocytic anemia  Hemoglobin has decreased from 14.3-11.8 in the past 3 months   Patient denies any bleeding.  Check ferritin, iron studies, B12, folate, and TSH. 4.   Lightheadedness  RN: check orthostatics.   Dr. Bethanne Ginger office was contacted.    Patient was directed to the ER. 5.   Right lower lobe crackles   CXR (PA and lateral) today.    Return after CXR to discuss with pt. 6.   RTC in 1 month for MD assessment and labs (CBC with diff +/- others).  RTC in  1 month for MD assess and labs (CBC with diff +/- others).  Addendum: CXR revealed cardiomegaly with small pleural effusions bilaterally.  There was no demonstrable pulmonary edema or adenopathy.  I discussed the assessment and treatment plan with the patient.  The patient was provided an opportunity to ask questions and all were answered.  The patient agreed with the plan and demonstrated an understanding of the instructions.  The patient was advised to call back if the symptoms worsen or if the condition fails to improve as anticipated.  I provided 19 minutes of face-to-face visit time during this encounter and > 50% was spent counseling as documented under my assessment and plan.  I provided these services from the [facility] office.  An additional 10+ minutes were spent reviewing her chart (Epic and Care Everywhere) including notes, labs, and imaging  studies.  Dr. Bethanne Ginger office was contacted.  Chest x-ray findings were discussed with the patient as well as the overall plan.   Angelica Asal, MD, PhD    06/11/2020, 10:58 AM  I, Angelica Juarez, am acting as Education administrator for [employer]. Angelica Gip, MD, PhD.  I, Angelica Juarez C. Angelica Gip, MD, have reviewed the above documentation for accuracy and completeness, and I agree with the above.

## 2020-06-10 NOTE — Progress Notes (Signed)
No new changes noted today. The patient Name and DOB has been verified by phone today. 

## 2020-06-11 ENCOUNTER — Other Ambulatory Visit: Payer: Self-pay

## 2020-06-11 ENCOUNTER — Inpatient Hospital Stay: Payer: Medicare Other

## 2020-06-11 ENCOUNTER — Inpatient Hospital Stay (HOSPITAL_BASED_OUTPATIENT_CLINIC_OR_DEPARTMENT_OTHER): Payer: Medicare Other | Admitting: Hematology and Oncology

## 2020-06-11 ENCOUNTER — Inpatient Hospital Stay
Admission: EM | Admit: 2020-06-11 | Discharge: 2020-06-13 | DRG: 291 | Disposition: A | Discharge status: Home / Self Care | Payer: Medicare Other | Attending: Internal Medicine | Admitting: Internal Medicine

## 2020-06-11 ENCOUNTER — Ambulatory Visit
Admission: RE | Admit: 2020-06-11 | Discharge: 2020-06-11 | Disposition: A | Discharge status: Home / Self Care | Payer: Medicare Other | Source: Ambulatory Visit | Attending: Hematology and Oncology | Admitting: Hematology and Oncology

## 2020-06-11 ENCOUNTER — Telehealth: Payer: Self-pay

## 2020-06-11 ENCOUNTER — Encounter: Payer: Self-pay | Admitting: Hematology and Oncology

## 2020-06-11 ENCOUNTER — Ambulatory Visit
Admission: RE | Admit: 2020-06-11 | Discharge: 2020-06-11 | Disposition: A | Discharge status: Home / Self Care | Payer: Medicare Other | Source: Home / Self Care | Attending: Hematology and Oncology | Admitting: Hematology and Oncology

## 2020-06-11 VITALS — BP 137/60 | HR 80 | Temp 96.8°F | Resp 22 | Wt 237.0 lb

## 2020-06-11 DIAGNOSIS — G4730 Sleep apnea, unspecified: Secondary | ICD-10-CM | POA: Diagnosis present

## 2020-06-11 DIAGNOSIS — Z86010 Personal history of colonic polyps: Secondary | ICD-10-CM | POA: Diagnosis not present

## 2020-06-11 DIAGNOSIS — R0989 Other specified symptoms and signs involving the circulatory and respiratory systems: Secondary | ICD-10-CM

## 2020-06-11 DIAGNOSIS — I129 Hypertensive chronic kidney disease with stage 1 through stage 4 chronic kidney disease, or unspecified chronic kidney disease: Secondary | ICD-10-CM | POA: Diagnosis not present

## 2020-06-11 DIAGNOSIS — Z87891 Personal history of nicotine dependence: Secondary | ICD-10-CM

## 2020-06-11 DIAGNOSIS — D649 Anemia, unspecified: Secondary | ICD-10-CM

## 2020-06-11 DIAGNOSIS — J90 Pleural effusion, not elsewhere classified: Secondary | ICD-10-CM | POA: Diagnosis not present

## 2020-06-11 DIAGNOSIS — Z923 Personal history of irradiation: Secondary | ICD-10-CM

## 2020-06-11 DIAGNOSIS — R42 Dizziness and giddiness: Secondary | ICD-10-CM | POA: Diagnosis not present

## 2020-06-11 DIAGNOSIS — N189 Chronic kidney disease, unspecified: Secondary | ICD-10-CM | POA: Diagnosis not present

## 2020-06-11 DIAGNOSIS — I130 Hypertensive heart and chronic kidney disease with heart failure and stage 1 through stage 4 chronic kidney disease, or unspecified chronic kidney disease: Principal | ICD-10-CM | POA: Diagnosis present

## 2020-06-11 DIAGNOSIS — E1122 Type 2 diabetes mellitus with diabetic chronic kidney disease: Secondary | ICD-10-CM | POA: Diagnosis present

## 2020-06-11 DIAGNOSIS — I509 Heart failure, unspecified: Secondary | ICD-10-CM

## 2020-06-11 DIAGNOSIS — I5033 Acute on chronic diastolic (congestive) heart failure: Secondary | ICD-10-CM | POA: Diagnosis present

## 2020-06-11 DIAGNOSIS — E785 Hyperlipidemia, unspecified: Secondary | ICD-10-CM | POA: Diagnosis present

## 2020-06-11 DIAGNOSIS — E119 Type 2 diabetes mellitus without complications: Secondary | ICD-10-CM | POA: Diagnosis not present

## 2020-06-11 DIAGNOSIS — Z7982 Long term (current) use of aspirin: Secondary | ICD-10-CM | POA: Diagnosis not present

## 2020-06-11 DIAGNOSIS — R0602 Shortness of breath: Secondary | ICD-10-CM | POA: Diagnosis not present

## 2020-06-11 DIAGNOSIS — Z7984 Long term (current) use of oral hypoglycemic drugs: Secondary | ICD-10-CM

## 2020-06-11 DIAGNOSIS — N1831 Chronic kidney disease, stage 3a: Secondary | ICD-10-CM | POA: Diagnosis present

## 2020-06-11 DIAGNOSIS — I4819 Other persistent atrial fibrillation: Secondary | ICD-10-CM

## 2020-06-11 DIAGNOSIS — D72829 Elevated white blood cell count, unspecified: Secondary | ICD-10-CM | POA: Diagnosis present

## 2020-06-11 DIAGNOSIS — I10 Essential (primary) hypertension: Secondary | ICD-10-CM | POA: Diagnosis present

## 2020-06-11 DIAGNOSIS — J9611 Chronic respiratory failure with hypoxia: Secondary | ICD-10-CM | POA: Diagnosis present

## 2020-06-11 DIAGNOSIS — Z96653 Presence of artificial knee joint, bilateral: Secondary | ICD-10-CM | POA: Diagnosis present

## 2020-06-11 DIAGNOSIS — Z9981 Dependence on supplemental oxygen: Secondary | ICD-10-CM

## 2020-06-11 DIAGNOSIS — D45 Polycythemia vera: Secondary | ICD-10-CM | POA: Diagnosis present

## 2020-06-11 DIAGNOSIS — Z79899 Other long term (current) drug therapy: Secondary | ICD-10-CM

## 2020-06-11 DIAGNOSIS — J441 Chronic obstructive pulmonary disease with (acute) exacerbation: Secondary | ICD-10-CM | POA: Diagnosis not present

## 2020-06-11 DIAGNOSIS — G40909 Epilepsy, unspecified, not intractable, without status epilepticus: Secondary | ICD-10-CM | POA: Diagnosis present

## 2020-06-11 DIAGNOSIS — Z803 Family history of malignant neoplasm of breast: Secondary | ICD-10-CM | POA: Diagnosis not present

## 2020-06-11 DIAGNOSIS — Z7901 Long term (current) use of anticoagulants: Secondary | ICD-10-CM

## 2020-06-11 DIAGNOSIS — J449 Chronic obstructive pulmonary disease, unspecified: Secondary | ICD-10-CM | POA: Diagnosis not present

## 2020-06-11 DIAGNOSIS — Z9989 Dependence on other enabling machines and devices: Secondary | ICD-10-CM

## 2020-06-11 DIAGNOSIS — Z7951 Long term (current) use of inhaled steroids: Secondary | ICD-10-CM

## 2020-06-11 DIAGNOSIS — E1129 Type 2 diabetes mellitus with other diabetic kidney complication: Secondary | ICD-10-CM

## 2020-06-11 DIAGNOSIS — Z9111 Patient's noncompliance with dietary regimen: Secondary | ICD-10-CM

## 2020-06-11 DIAGNOSIS — Z8589 Personal history of malignant neoplasm of other organs and systems: Secondary | ICD-10-CM

## 2020-06-11 DIAGNOSIS — Z20822 Contact with and (suspected) exposure to covid-19: Secondary | ICD-10-CM | POA: Diagnosis present

## 2020-06-11 DIAGNOSIS — Z90710 Acquired absence of both cervix and uterus: Secondary | ICD-10-CM

## 2020-06-11 DIAGNOSIS — I482 Chronic atrial fibrillation, unspecified: Secondary | ICD-10-CM | POA: Diagnosis present

## 2020-06-11 DIAGNOSIS — R809 Proteinuria, unspecified: Secondary | ICD-10-CM

## 2020-06-11 LAB — CBC WITH DIFFERENTIAL/PLATELET
Abs Immature Granulocytes: 0.21 10*3/uL — ABNORMAL HIGH (ref 0.00–0.07)
Abs Immature Granulocytes: 0.24 10*3/uL — ABNORMAL HIGH (ref 0.00–0.07)
Basophils Absolute: 0.1 10*3/uL (ref 0.0–0.1)
Basophils Absolute: 0.1 10*3/uL (ref 0.0–0.1)
Basophils Relative: 1 %
Basophils Relative: 1 %
Eosinophils Absolute: 0.3 10*3/uL (ref 0.0–0.5)
Eosinophils Absolute: 0.3 10*3/uL (ref 0.0–0.5)
Eosinophils Relative: 2 %
Eosinophils Relative: 2 %
HCT: 33.1 % — ABNORMAL LOW (ref 36.0–46.0)
HCT: 32.9 % — ABNORMAL LOW (ref 36.0–46.0)
Hemoglobin: 11.5 g/dL — ABNORMAL LOW (ref 12.0–15.0)
Hemoglobin: 11.8 g/dL — ABNORMAL LOW (ref 12.0–15.0)
Immature Granulocytes: 2 %
Immature Granulocytes: 2 %
Lymphocytes Relative: 5 %
Lymphocytes Relative: 5 %
Lymphs Abs: 0.6 10*3/uL — ABNORMAL LOW (ref 0.7–4.0)
Lymphs Abs: 0.6 10*3/uL — ABNORMAL LOW (ref 0.7–4.0)
MCH: 35.1 pg — ABNORMAL HIGH (ref 26.0–34.0)
MCH: 35.6 pg — ABNORMAL HIGH (ref 26.0–34.0)
MCHC: 34.7 g/dL (ref 30.0–36.0)
MCHC: 35.9 g/dL (ref 30.0–36.0)
MCV: 100.9 fL — ABNORMAL HIGH (ref 80.0–100.0)
MCV: 99.4 fL (ref 80.0–100.0)
Monocytes Absolute: 1.2 10*3/uL — ABNORMAL HIGH (ref 0.1–1.0)
Monocytes Absolute: 1.1 10*3/uL — ABNORMAL HIGH (ref 0.1–1.0)
Monocytes Relative: 9 %
Monocytes Relative: 8 %
Neutro Abs: 10.7 10*3/uL — ABNORMAL HIGH (ref 1.7–7.7)
Neutro Abs: 10.5 10*3/uL — ABNORMAL HIGH (ref 1.7–7.7)
Neutrophils Relative %: 81 %
Neutrophils Relative %: 82 %
Platelets: 366 10*3/uL (ref 150–400)
Platelets: 362 10*3/uL (ref 150–400)
RBC: 3.28 MIL/uL — ABNORMAL LOW (ref 3.87–5.11)
RBC: 3.31 MIL/uL — ABNORMAL LOW (ref 3.87–5.11)
RDW: 15.8 % — ABNORMAL HIGH (ref 11.5–15.5)
RDW: 15.7 % — ABNORMAL HIGH (ref 11.5–15.5)
WBC: 13.2 10*3/uL — ABNORMAL HIGH (ref 4.0–10.5)
WBC: 12.8 10*3/uL — ABNORMAL HIGH (ref 4.0–10.5)
nRBC: 0 % (ref 0.0–0.2)
nRBC: 0 % (ref 0.0–0.2)

## 2020-06-11 LAB — GLUCOSE, CAPILLARY: Glucose-Capillary: 316 mg/dL — ABNORMAL HIGH (ref 70–99)

## 2020-06-11 LAB — BASIC METABOLIC PANEL
Anion gap: 11 (ref 5–15)
BUN: 30 mg/dL — ABNORMAL HIGH (ref 8–23)
CO2: 20 mmol/L — ABNORMAL LOW (ref 22–32)
Calcium: 9.6 mg/dL (ref 8.9–10.3)
Chloride: 108 mmol/L (ref 98–111)
Creatinine, Ser: 1.07 mg/dL — ABNORMAL HIGH (ref 0.44–1.00)
GFR calc Af Amer: 58 mL/min — ABNORMAL LOW (ref >60)
GFR calc non Af Amer: 50 mL/min — ABNORMAL LOW (ref >60)
Glucose, Bld: 165 mg/dL — ABNORMAL HIGH (ref 70–99)
Potassium: 3.4 mmol/L — ABNORMAL LOW (ref 3.5–5.1)
Sodium: 139 mmol/L (ref 135–145)

## 2020-06-11 LAB — VITAMIN B12: Vitamin B-12: 420 pg/mL (ref 180–914)

## 2020-06-11 LAB — BRAIN NATRIURETIC PEPTIDE: B Natriuretic Peptide: 355.2 pg/mL — ABNORMAL HIGH (ref 0.0–100.0)

## 2020-06-11 LAB — BASIC METABOLIC PANEL WITH GFR
Anion gap: 10 (ref 5–15)
BUN: 30 mg/dL — ABNORMAL HIGH (ref 8–23)
CO2: 19 mmol/L — ABNORMAL LOW (ref 22–32)
Calcium: 9.3 mg/dL (ref 8.9–10.3)
Chloride: 109 mmol/L (ref 98–111)
Creatinine, Ser: 1.08 mg/dL — ABNORMAL HIGH (ref 0.44–1.00)
GFR calc Af Amer: 57 mL/min — ABNORMAL LOW
GFR calc non Af Amer: 49 mL/min — ABNORMAL LOW
Glucose, Bld: 101 mg/dL — ABNORMAL HIGH (ref 70–99)
Potassium: 3.5 mmol/L (ref 3.5–5.1)
Sodium: 138 mmol/L (ref 135–145)

## 2020-06-11 LAB — SARS CORONAVIRUS 2 BY RT PCR (HOSPITAL ORDER, PERFORMED IN ~~LOC~~ HOSPITAL LAB): SARS Coronavirus 2: NEGATIVE

## 2020-06-11 LAB — IRON AND TIBC
Iron: 60 ug/dL (ref 28–170)
Saturation Ratios: 19 % (ref 10.4–31.8)
TIBC: 316 ug/dL (ref 250–450)
UIBC: 256 ug/dL

## 2020-06-11 LAB — TSH: TSH: 3.906 u[IU]/mL (ref 0.350–4.500)

## 2020-06-11 LAB — TROPONIN I (HIGH SENSITIVITY): Troponin I (High Sensitivity): 14 ng/L (ref <18)

## 2020-06-11 LAB — FERRITIN: Ferritin: 107 ng/mL (ref 11–307)

## 2020-06-11 LAB — FOLATE: Folate: 34 ng/mL (ref >5.9)

## 2020-06-11 MED ORDER — HYDROXYUREA 500 MG PO CAPS: 500.0000 mg | ORAL_CAPSULE | ORAL | Status: DC

## 2020-06-11 MED ORDER — SODIUM CHLORIDE 0.9 % IV SOLN: 250.0000 mL | INTRAVENOUS | Status: DC | PRN

## 2020-06-11 MED ORDER — ASPIRIN EC 81 MG PO TBEC
81.0000 mg | DELAYED_RELEASE_TABLET | Freq: Every day | ORAL | Status: DC
  Administered 2020-06-12 – 2020-06-13 (×2): 81 mg via ORAL
  Filled 2020-06-11 (×2): qty 1

## 2020-06-11 MED ORDER — INSULIN ASPART 100 UNIT/ML ~~LOC~~ SOLN
0.0000 [IU] | Freq: Three times a day (TID) | SUBCUTANEOUS | Status: DC
  Administered 2020-06-12: 3 [IU] via SUBCUTANEOUS
  Administered 2020-06-12: 5 [IU] via SUBCUTANEOUS
  Administered 2020-06-12: 3 [IU] via SUBCUTANEOUS
  Administered 2020-06-13: 2 [IU] via SUBCUTANEOUS
  Administered 2020-06-13: 3 [IU] via SUBCUTANEOUS
  Filled 2020-06-11 (×4): qty 1

## 2020-06-11 MED ORDER — IPRATROPIUM-ALBUTEROL 0.5-2.5 (3) MG/3ML IN SOLN: 3.0000 mL | RESPIRATORY_TRACT | Status: DC | PRN

## 2020-06-11 MED ORDER — SODIUM CHLORIDE 0.9 % IV SOLN
500.0000 mg | INTRAVENOUS | Status: DC
  Administered 2020-06-11: 500 mg via INTRAVENOUS
  Filled 2020-06-11: qty 500

## 2020-06-11 MED ORDER — FUROSEMIDE 10 MG/ML IJ SOLN
40.0000 mg | Freq: Once | INTRAMUSCULAR | Status: AC
  Administered 2020-06-11: 40 mg via INTRAVENOUS
  Filled 2020-06-11: qty 4

## 2020-06-11 MED ORDER — ALBUTEROL SULFATE (2.5 MG/3ML) 0.083% IN NEBU
2.5000 mg | INHALATION_SOLUTION | Freq: Once | RESPIRATORY_TRACT | Status: AC
  Administered 2020-06-11: 2.5 mg via RESPIRATORY_TRACT
  Filled 2020-06-11: qty 3

## 2020-06-11 MED ORDER — NIFEDIPINE ER OSMOTIC RELEASE 30 MG PO TB24
30.0000 mg | ORAL_TABLET | Freq: Every day | ORAL | Status: DC
  Administered 2020-06-12 – 2020-06-13 (×2): 30 mg via ORAL
  Filled 2020-06-11 (×3): qty 1

## 2020-06-11 MED ORDER — METHYLPREDNISOLONE SODIUM SUCC 40 MG IJ SOLR
40.0000 mg | Freq: Two times a day (BID) | INTRAMUSCULAR | Status: DC
  Administered 2020-06-12: 40 mg via INTRAVENOUS
  Filled 2020-06-11: qty 1

## 2020-06-11 MED ORDER — LEVETIRACETAM 750 MG PO TABS: 750.0000 mg | ORAL_TABLET | Freq: Two times a day (BID) | ORAL | Status: DC

## 2020-06-11 MED ORDER — ONDANSETRON HCL 4 MG/2ML IJ SOLN: 4.0000 mg | Freq: Four times a day (QID) | INTRAMUSCULAR | Status: DC | PRN

## 2020-06-11 MED ORDER — SODIUM CHLORIDE 0.9% FLUSH: 3.0000 mL | INTRAVENOUS | Status: DC | PRN

## 2020-06-11 MED ORDER — LEVETIRACETAM 500 MG PO TABS
500.0000 mg | ORAL_TABLET | Freq: Two times a day (BID) | ORAL | Status: DC
  Administered 2020-06-11 – 2020-06-13 (×4): 500 mg via ORAL
  Filled 2020-06-11 (×5): qty 1

## 2020-06-11 MED ORDER — SODIUM CHLORIDE 0.9% FLUSH
3.0000 mL | Freq: Two times a day (BID) | INTRAVENOUS | Status: DC
  Administered 2020-06-11 – 2020-06-13 (×4): 3 mL via INTRAVENOUS

## 2020-06-11 MED ORDER — PREDNISONE 20 MG PO TABS
60.0000 mg | ORAL_TABLET | Freq: Once | ORAL | Status: AC
  Administered 2020-06-11: 60 mg via ORAL
  Filled 2020-06-11: qty 3

## 2020-06-11 MED ORDER — HYDROCHLOROTHIAZIDE 25 MG PO TABS
25.0000 mg | ORAL_TABLET | Freq: Every day | ORAL | Status: DC
  Administered 2020-06-12 – 2020-06-13 (×2): 25 mg via ORAL
  Filled 2020-06-11 (×2): qty 1

## 2020-06-11 MED ORDER — APIXABAN 5 MG PO TABS
5.0000 mg | ORAL_TABLET | Freq: Two times a day (BID) | ORAL | Status: DC
  Administered 2020-06-11 – 2020-06-13 (×4): 5 mg via ORAL
  Filled 2020-06-11 (×4): qty 1

## 2020-06-11 MED ORDER — INSULIN ASPART 100 UNIT/ML ~~LOC~~ SOLN
0.0000 [IU] | Freq: Every day | SUBCUTANEOUS | Status: DC
  Administered 2020-06-11: 4 [IU] via SUBCUTANEOUS
  Filled 2020-06-11: qty 1

## 2020-06-11 MED ORDER — ACETAMINOPHEN 325 MG PO TABS: 650.0000 mg | ORAL_TABLET | ORAL | Status: DC | PRN

## 2020-06-11 MED ORDER — POTASSIUM CHLORIDE CRYS ER 20 MEQ PO TBCR
20.0000 meq | EXTENDED_RELEASE_TABLET | Freq: Two times a day (BID) | ORAL | Status: AC
  Administered 2020-06-11 – 2020-06-12 (×3): 20 meq via ORAL
  Filled 2020-06-11 (×5): qty 1

## 2020-06-11 MED ORDER — UMECLIDINIUM-VILANTEROL 62.5-25 MCG/INH IN AEPB
1.0000 | INHALATION_SPRAY | Freq: Every day | RESPIRATORY_TRACT | Status: DC
  Administered 2020-06-12 – 2020-06-13 (×2): 1 via RESPIRATORY_TRACT
  Filled 2020-06-11: qty 14

## 2020-06-11 MED ORDER — MONTELUKAST SODIUM 10 MG PO TABS
10.0000 mg | ORAL_TABLET | Freq: Every day | ORAL | Status: DC
  Administered 2020-06-12: 10 mg via ORAL
  Filled 2020-06-11 (×2): qty 1

## 2020-06-11 MED ORDER — IPRATROPIUM-ALBUTEROL 0.5-2.5 (3) MG/3ML IN SOLN
9.0000 mL | Freq: Once | RESPIRATORY_TRACT | Status: AC
  Administered 2020-06-11: 9 mL via RESPIRATORY_TRACT
  Filled 2020-06-11: qty 3

## 2020-06-11 MED ORDER — VALSARTAN-HYDROCHLOROTHIAZIDE 320-25 MG PO TABS: 1.0000 | ORAL_TABLET | Freq: Every day | ORAL | Status: DC

## 2020-06-11 MED ORDER — ATORVASTATIN CALCIUM 20 MG PO TABS
40.0000 mg | ORAL_TABLET | Freq: Every day | ORAL | Status: DC
  Administered 2020-06-12 – 2020-06-13 (×2): 40 mg via ORAL
  Filled 2020-06-11 (×2): qty 2

## 2020-06-11 MED ORDER — IRBESARTAN 150 MG PO TABS
300.0000 mg | ORAL_TABLET | Freq: Every day | ORAL | Status: DC
  Administered 2020-06-12 – 2020-06-13 (×2): 300 mg via ORAL
  Filled 2020-06-11 (×2): qty 2

## 2020-06-11 MED ORDER — FUROSEMIDE 10 MG/ML IJ SOLN
40.0000 mg | Freq: Two times a day (BID) | INTRAMUSCULAR | Status: DC
  Administered 2020-06-12 – 2020-06-13 (×3): 40 mg via INTRAVENOUS
  Filled 2020-06-11 (×3): qty 4

## 2020-06-11 NOTE — ED Notes (Signed)
Assumed care of patient , patient reports increased short of breath, that began yesterday and is worse today. Patient dysnic with exertion. O2 dependant at home. Sating 99% on 2lnc. Auditory whezes noted with insp/exp.

## 2020-06-11 NOTE — Telephone Encounter (Signed)
The patient labs has been routed to her PCP office.

## 2020-06-11 NOTE — ED Provider Notes (Addendum)
[facility] Emergency Department Provider Note   ____________________________________________   First MD Initiated Contact with Patient 06/11/20 1755     (approximate)  I have reviewed the triage vital signs and the nursing notes.   HISTORY  Chief Complaint Shortness of Breath    HPI Angelica Juarez is a 79 y.o. female with possible history of hypertension, hyperlipidemia, CKD, diabetes, seizures, atrial fibrillation on Eliquis, CHF, and COPD on 2 L presents to the ED complaining of shortness of breath.  Patient reports that she has had increasing difficulty breathing for the past 3 to 4 days and has been having to use her inhaler more than usual.  She denies any associated fevers or cough, has not had any chest pain.  She has noticed some increasing swelling in her legs, has had some trouble breathing when laying flat but states this is normal for her.  She additionally complains of diarrhea that has been ongoing for multiple months, she reports a history of C. difficile but has been tested for this 6 times recently with negative testing.  She now states she has not had a bowel movement in the past 2 days and denies any abdominal pain.        Past Medical History:  Diagnosis Date  . Allergy    Seasonal  . Arthritis   . 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 Chronic kidney disease    chronic renal insufficiency  . Colon polyps 05/21/14  . COPD (chronic obstructive pulmonary disease) ([place])   . Diabetes mellitus without complication ([place])   . Dyspnea   . Fracture closed, humerus, shaft    right   . History of kidney stones    40 years ago  . Hyperlipidemia 04/30/14  . Hypertension   . Impingement syndrome of right shoulder 12/29/15  . Personal history of radiation therapy   . Pneumonia    hx  . Rotator cuff tear 04/21/16   right  . Seizures ([place])   . Sleep apnea     Patient Active Problem List   Diagnosis Date Noted  . Normocytic anemia  06/11/2020  . Chronic diarrhea of unknown origin   . Acute left flank pain 01/15/2020  . Hypokalemia 11/14/2019  . Proteinuria 08/09/2019  . Chronic heart failure with preserved ejection fraction ([place]) 07/09/2019  . Nonrheumatic mitral valve regurgitation 07/08/2019  . Other persistent atrial fibrillation ([place]) 07/08/2019  . C. difficile diarrhea 11/24/2018  . Diarrhea 11/18/2018  . Nausea without vomiting 11/18/2018  . Goals of care, counseling/discussion 09/12/2018  . 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 History of repair of right rotator cuff 08/25/2016  . Acute respiratory failure with hypoxia ([place]) 08/06/2016  . Nontraumatic tear of right rotator cuff 04/21/2016  . Atypical chest pain 04/07/2016  . Impingement syndrome of right shoulder 12/29/2015  . Primary osteoarthritis of first carpometacarpal joint of left hand 10/27/2015  . Right shoulder pain 10/27/2015  . OSA on CPAP 10/14/2015  . COPD with asthma ([place]) 07/13/2015  . Type 2 diabetes mellitus with microalbuminuria, without long-term current use of insulin ([place]) 07/13/2015  . Essential hypertension 03/05/2015  . Difficulty sleeping 11/26/2014  . CRI (chronic renal insufficiency) 07/31/2014  . Seizure disorder ([place]) 07/31/2014  . History of colonic polyps 05/21/2014  . Hyperlipidemia, unspecified 04/30/2014    Past Surgical History:  Procedure Laterality Date  . ABDOMINAL HYSTERECTOMY    . COLONOSCOPY WITH PROPOFOL N/A 06/01/2020   Procedure: COLONOSCOPY  WITH PROPOFOL;  Surgeon: Lin Landsman, MD;  Location: [facility] ENDOSCOPY;  Service: Gastroenterology;  Laterality: N/A;  . ESOPHAGOGASTRODUODENOSCOPY (EGD) WITH PROPOFOL N/A 06/01/2020   Procedure: ESOPHAGOGASTRODUODENOSCOPY (EGD) WITH PROPOFOL;  Surgeon: Lin Landsman, MD;  Location: [facility] ENDOSCOPY;  Service: Gastroenterology;  Laterality: N/A;  . 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 EYE SURGERY     bilateral cataracts  . JOINT  REPLACEMENT     bilateral knee replacement  . NASAL SEPTUM SURGERY    . OOPHORECTOMY    . REPLACEMENT TOTAL KNEE Bilateral 2004  . SHOULDER ARTHROSCOPY WITH BICEPSTENOTOMY Right 05/25/2016   Procedure: SHOULDER ARTHROSCOPY WITH BICEPSTENOTOMY;  Surgeon: Leanor Kail, MD;  Location: [facility] ORS;  Service: Orthopedics;  Laterality: Right;  . SHOULDER ARTHROSCOPY WITH DISTAL CLAVICLE RESECTION Right 05/25/2016   Procedure: SHOULDER ARTHROSCOPY WITH DISTAL CLAVICLE RESECTION;  Surgeon: Leanor Kail, MD;  Location: [facility] ORS;  Service: Orthopedics;  Laterality: Right;  . SHOULDER ARTHROSCOPY WITH OPEN ROTATOR CUFF REPAIR Right 05/25/2016   Procedure: SHOULDER ARTHROSCOPY WITH OPEN ROTATOR CUFF REPAIR;  Surgeon: Leanor Kail, MD;  Location: [facility] ORS;  Service: Orthopedics;  Laterality: Right;  . SUBACROMIAL DECOMPRESSION Right 05/25/2016   Procedure: SUBACROMIAL DECOMPRESSION;  Surgeon: Leanor Kail, MD;  Location: [facility] ORS;  Service: Orthopedics;  Laterality: Right;    Prior to Admission medications   Medication Sig Start Date End Date Taking? Authorizing Provider  albuterol (ACCUNEB) 1.25 MG/3ML nebulizer solution Take 3 mLs (1.25 mg total) by nebulization every 6 (six) hours as needed. wheezing 05/03/19   Allyne Gee, MD  apixaban (ELIQUIS) 5 MG TABS tablet Take 1 tablet (5 mg total) by mouth 2 (two) times daily. 06/04/20   Lin Landsman, MD  aspirin 81 MG tablet Take 81 mg by mouth daily.    [provider]  atorvastatin (LIPITOR) 40 MG tablet Take 40 mg by mouth daily. 04/04/16   [provider]  B Complex Vitamins (VITAMIN B COMPLEX PO) Take 1 tablet by mouth daily.    [provider]  budesonide (ENTOCORT EC) 3 MG 24 hr capsule Take 3 capsules (9 mg total) by mouth daily. 06/04/20   Lin Landsman, MD  diphenhydrAMINE (BENADRYL) 25 mg capsule Take 25 mg by mouth daily.    [provider]  EPINEPHrine 0.3 mg/0.3 mL IJ SOAJ injection Inject 0.3  mg into the muscle as needed for anaphylaxis.  Patient not taking: Reported on 06/10/2020 01/24/19   [provider]  fluticasone (FLONASE) 50 MCG/ACT nasal spray Place 2 sprays into both nostrils daily. 08/08/19   Kendell Bane, NP  Fluticasone-Umeclidin-Vilant (TRELEGY ELLIPTA) 100-62.5-25 MCG/INH AEPB Inhale 1 puff into the lungs daily. Patient not taking: Reported on 06/10/2020 08/16/18   Allyne Gee, MD  furosemide (LASIX) 20 MG tablet Take 1 tablet (20 mg total) by mouth 2 (two) times daily. 08/08/16   Demetrios Loll, MD  hydrALAZINE (APRESOLINE) 100 MG tablet Take 50 mg by mouth 2 (two) times daily.  02/04/16   [provider]  hydroxyurea (HYDREA) 500 MG capsule Take 1 pill twice a day on Mondays only and 1 pill a day by mouth on Tuesdays-Sundays. May take with food to minimize GI side effects. 03/19/20   Lequita Asal, MD  levETIRAcetam (KEPPRA) 750 MG tablet Take 750 mg by mouth 2 (two) times daily. 03/14/16   [provider]  mesalamine (LIALDA) 1.2 g EC tablet Take 2 tablets (2.4 g total) by  mouth 2 (two) times daily. 05/08/20 06/07/20  Lin Landsman, MD  metFORMIN (GLUCOPHAGE) 500 MG tablet Take 500 mg by mouth 2 (two) times daily with a meal. 12/25/15   [provider]  montelukast (SINGULAIR) 10 MG tablet TAKE 1 TABLET BY MOUTH EVERY DAY Patient taking differently: Take 10 mg by mouth daily.  07/02/15   Kathrine Haddock, NP  Multiple Vitamin (MULTI-VITAMINS) TABS Take 1 tablet by mouth daily.    [provider]  NIFEdipine (NIFEDICAL XL) 30 MG 24 hr tablet Take 30 mg by mouth daily. 09/30/15   [provider]  OVER THE COUNTER MEDICATION Place 1 drop into both eyes daily. Allergy eye drops Patient not taking: Reported on 06/10/2020    [provider]  OXYGEN Inhale 2 L into the lungs.    [provider]  sitaGLIPtin (JANUVIA) 25 MG tablet Take 1 tablet by mouth daily. 01/01/18   [provider]    umeclidinium-vilanterol (ANORO ELLIPTA) 62.5-25 MCG/INH AEPB Inhale 1 puff into the lungs daily. 02/06/20   Kendell Bane, NP  valsartan-hydrochlorothiazide (DIOVAN-HCT) 320-25 MG tablet Take 1 tablet by mouth daily. 04/14/16   [provider]  VENTOLIN HFA 108 (90 Base) MCG/ACT inhaler INHALE 2 PUFFS INTO THE LUNGS EVERY 6 HOURS AS NEEDED Patient taking differently: Inhale 2 puffs into the lungs every 6 (six) hours as needed for wheezing or shortness of breath.  02/18/19   Kendell Bane, NP  vitamin C (ASCORBIC ACID) 500 MG tablet Take 500 mg by mouth 2 (two) times daily.    [provider]    Allergies Iodinated diagnostic agents, Latex, Phenobarbital, and Tape  Family History  Problem Relation Age of Onset  . Breast cancer Paternal Aunt 24  . Diabetes Father   . Heart disease Father   . Breast cancer Daughter 65    Social History Social History   Tobacco Use  . Smoking status: Former Smoker    Packs/day: 1.00    Years: 40.00    Pack years: 40.00    Quit date: 05/12/2005    Years since quitting: 15.0  . Smokeless tobacco: Never Used  Vaping Use  . Vaping Use: Never used  Substance Use Topics  . Alcohol use: No  . Drug use: No    Review of Systems  Constitutional: No fever/chills Eyes: No visual changes. ENT: No sore throat. Cardiovascular: Denies chest pain. Respiratory: Positive for shortness of breath. Gastrointestinal: No abdominal pain.  No nausea, no vomiting.  Positive for diarrhea.  No constipation. Genitourinary: Negative for dysuria. Musculoskeletal: Negative for back pain. Skin: Negative for rash. Neurological: Negative for headaches, focal weakness or numbness.  ____________________________________________   PHYSICAL EXAM:  VITAL SIGNS: ED Triage Vitals [06/11/20 1608]  Enc Vitals Group     BP (!) 148/62     Pulse Rate 86     Resp 22     Temp 98.2 F (36.8 C)     Temp Source Oral     SpO2 95 %     Weight (!) 230 lb  (104.3 kg)     Height 5\' 7"  (1.702 m)     Head Circumference      Peak Flow      Pain Score 0     Pain Loc      Pain Edu?      Excl. in [place]?     Constitutional: Alert and oriented. Eyes: Conjunctivae are normal. Head: Atraumatic. Nose: No congestion/rhinnorhea. Mouth/Throat: Mucous  membranes are moist. Neck: Normal ROM Cardiovascular: Normal rate, regular rhythm. Grossly normal heart sounds. Respiratory: Tachypneic with increased respiratory effort.  No retractions. Lungs with poor air movement and wheezing throughout, crackles to bilateral bases. Gastrointestinal: Soft and nontender. No distention. Genitourinary: deferred Musculoskeletal: No lower extremity tenderness, 1+ pitting edema to knees bilaterally. Neurologic:  Normal speech and language. No gross focal neurologic deficits are appreciated. Skin:  Skin is warm, dry and intact. No rash noted. Psychiatric: Mood and affect are normal. Speech and behavior are normal.  ____________________________________________   LABS (all labs ordered are listed, but only abnormal results are displayed)  Labs Reviewed  BASIC METABOLIC PANEL - Abnormal; Notable for the following components:      Result Value   Potassium 3.4 (*)    CO2 20 (*)    Glucose, Bld 165 (*)    BUN 30 (*)    Creatinine, Ser 1.07 (*)    GFR calc non Af Amer 50 (*)    GFR calc Af Amer 58 (*)    All other components within normal limits  CBC WITH DIFFERENTIAL/PLATELET - Abnormal; Notable for the following components:   WBC 12.8 (*)    RBC 3.31 (*)    Hemoglobin 11.8 (*)    HCT 32.9 (*)    MCH 35.6 (*)    RDW 15.7 (*)    Neutro Abs 10.5 (*)    Lymphs Abs 0.6 (*)    Monocytes Absolute 1.1 (*)    Abs Immature Granulocytes 0.24 (*)    All other components within normal limits  BRAIN NATRIURETIC PEPTIDE - Abnormal; Notable for the following components:   B Natriuretic Peptide 355.2 (*)    All other components within normal limits  SARS CORONAVIRUS 2 BY RT  PCR (HOSPITAL ORDER, [place] [facility])  TROPONIN I (HIGH SENSITIVITY)  TROPONIN I (HIGH SENSITIVITY)   ____________________________________________  EKG  ED ECG REPORT I, Blake Divine, the attending physician, personally viewed and interpreted this ECG.   Date: 06/11/2020  EKG Time: 16:39  Rate: 80  Rhythm: Atrial fibrillation, PVC  Axis: Normal  Intervals:none  ST&T Change: None   PROCEDURES  Procedure(s) performed (including Critical Care):  .Critical Care Performed by: Blake Divine, MD Authorized by: Blake Divine, MD   Critical care provider statement:    Critical care time (minutes):  45   Critical care time was exclusive of:  Separately billable procedures and treating other patients and teaching time   Critical care was necessary to treat or prevent imminent or life-threatening deterioration of the following conditions:  Respiratory failure   Critical care was time spent personally by me on the following activities:  Discussions with consultants, evaluation of patient's response to treatment, examination of patient, ordering and performing treatments and interventions, ordering and review of laboratory studies, ordering and review of radiographic studies, pulse oximetry, re-evaluation of patient's condition, obtaining history from patient or surrogate and review of old charts   I assumed direction of critical care for this patient from another provider in my specialty: no       ____________________________________________   INITIAL IMPRESSION / ASSESSMENT AND PLAN / ED COURSE       79 year old female with past medical history of hypertension, hyperlipidemia, CKD, diabetes, seizures, atrial fibrillation on Eliquis, CHF, and COPD on 2 L presents to the ED complaining of increasing difficulty breathing over the past 3 to 4 days, has been using her inhaler more than usual.  She has poor  air movement and wheezing on exam, we will treat with  prednisone and breathing treatments.  She also notes some lower extremity edema with crackles on exam, no overt pulmonary edema on chest x-ray but she does have small bilateral pleural effusions likely related to CHF.  She has no fever or cough to suggest parapneumonic effusions.  EKG is unremarkable and she denies any chest pain, troponin within normal limits.  Remainder of lab work is unchanged from patient's baseline, we will reassess following diuresis and treatment for COPD.  She remained stable on her usual 2 L nasal cannula.  Patient reports she feels better following breathing treatments and Lasix, she was able to diuresis some but still has wheezing and crackles on reexamination. When she ambulated to go to the bathroom on her usual 2 L, she had oxygen desaturation to 87%. Patient initially was requesting to be discharged home, but now wishes to stay for admission.      ____________________________________________   FINAL CLINICAL IMPRESSION(S) / ED DIAGNOSES  Final diagnoses:  COPD exacerbation ([place])  Acute on chronic congestive heart failure, unspecified heart failure type [facility])     ED Discharge Orders    None       Note:  This document was prepared using Dragon voice recognition software and may include unintentional dictation errors.   Blake Divine, MD 06/11/20 Patrecia Pour    Blake Divine, MD 06/29/20 [phone]

## 2020-06-11 NOTE — Telephone Encounter (Signed)
Spoke with Dr Ubaldo Glassing office to inform them of the patient blood pressure and the orthostatics that was done in the office. The patient was having dizziness while in the office today. I reach out to dr Ubaldo Glassing office again and they informed me to tell the patient to go to the ED now. I spoke with the patient to inform her that Dr Ubaldo Glassing nurse has informed me that you will need to go over to the ED . The patient was understanding and agreeable.

## 2020-06-11 NOTE — ED Notes (Signed)
Pt given snack- crackers and peanut butter and drink. Pt denies any further requests at this time.

## 2020-06-11 NOTE — ED Triage Notes (Signed)
Pt arrives via POV after being sent by Dr. Mike Gip for fluid on the lung. Pt reports she went to her follow up at cancer center and was then sent for xray due to MD hearing "liquid in lung". Per pt a call was made to cardiologist because her heart is enlarged and was told to come to the ED. Pt on 2L [place] chronically. Pt has trouble speaking in complete sentences but states this is baseline for her. PT skin warm and dry and in NAD.

## 2020-06-11 NOTE — ED Notes (Signed)
Pt ambulated to restroom. O2 sats declined to 87% and pt had increased work of breathing. Pt bumped to 3L [place] from 2 and improved quickly (3 minutes) to 99%

## 2020-06-11 NOTE — H&P (Signed)
History and Physical    [name] SFK:[id number] DOB: 02-21-1941 DOA: 06/11/2020  PCP: Sofie Hartigan, MD   Patient coming from: Home   Chief Complaint: SOB   HPI: Angelica Juarez is a 79 y.o. female with medical history significant for polycythemia vera, mild renal insufficiency, COPD with chronic hypoxic respiratory failure, chronic heart failure with preserved EF, seizure disorder, hypertension, and type 2 diabetes mellitus, now presenting to the emergency department with increased shortness of breath, cough, wheezing, and leg swelling bilaterally.  Patient reports approximately 1 week of increased dyspnea, particularly with any exertion, notes a mild cough with some scant sputum production, denies fevers or chest pain, reports new bilateral lower leg swelling, and was told that there was increased heart size and fluid in her lungs on an outpatient chest x-ray.  She reports continued adherence with her medications, notes orthopnea but states that this is chronic, and reports that she had been increasing her fluid intake recently due to diarrhea.  ED Course: Upon arrival to the ED, patient is found to be afebrile, saturating in the mid 80s on her usual 2 L/min with minimal exertion, mildly tachypneic, and with blood pressure 170/85.  EKG features atrial fibrillation with PVCs.  Chest x-ray with cardiomegaly and small bilateral pleural effusions.  Chemistry panel notable for potassium 3.4 and creatinine 1.07.  CBC features a chronic leukocytosis to 12,800 and a mild normocytic anemia.  Troponin is normal and BNP elevated to 355.  Patient was treated with albuterol, DuoNeb's, prednisone, and Lasix in the ED.  Review of Systems:  All other systems reviewed and apart from HPI, are negative.  Past Medical History:  Diagnosis Date  . Allergy    Seasonal  . Arthritis   . 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 Chronic kidney disease    chronic renal insufficiency  . Colon polyps 05/21/14    . COPD (chronic obstructive pulmonary disease) ([place])   . Diabetes mellitus without complication ([place])   . Dyspnea   . Fracture closed, humerus, shaft    right   . History of kidney stones    40 years ago  . Hyperlipidemia 04/30/14  . Hypertension   . Impingement syndrome of right shoulder 12/29/15  . Personal history of radiation therapy   . Pneumonia    hx  . Rotator cuff tear 04/21/16   right  . Seizures ([place])   . Sleep apnea     Past Surgical History:  Procedure Laterality Date  . ABDOMINAL HYSTERECTOMY    . COLONOSCOPY WITH PROPOFOL N/A 06/01/2020   Procedure: COLONOSCOPY WITH PROPOFOL;  Surgeon: Lin Landsman, MD;  Location: [facility] ENDOSCOPY;  Service: Gastroenterology;  Laterality: N/A;  . ESOPHAGOGASTRODUODENOSCOPY (EGD) WITH PROPOFOL N/A 06/01/2020   Procedure: ESOPHAGOGASTRODUODENOSCOPY (EGD) WITH PROPOFOL;  Surgeon: Lin Landsman, MD;  Location: [facility] ENDOSCOPY;  Service: Gastroenterology;  Laterality: N/A;  . 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 EYE SURGERY     bilateral cataracts  . JOINT REPLACEMENT     bilateral knee replacement  . NASAL SEPTUM SURGERY    . OOPHORECTOMY    . REPLACEMENT TOTAL KNEE Bilateral 2004  . SHOULDER ARTHROSCOPY WITH BICEPSTENOTOMY Right 05/25/2016   Procedure: SHOULDER ARTHROSCOPY WITH BICEPSTENOTOMY;  Surgeon: Leanor Kail, MD;  Location: [facility] ORS;  Service: Orthopedics;  Laterality: Right;  . SHOULDER ARTHROSCOPY WITH DISTAL CLAVICLE RESECTION Right 05/25/2016   Procedure: SHOULDER ARTHROSCOPY WITH DISTAL CLAVICLE RESECTION;  Surgeon: Leanor Kail, MD;  Location: [facility] ORS;  Service: Orthopedics;  Laterality: Right;  . SHOULDER ARTHROSCOPY WITH OPEN ROTATOR CUFF REPAIR Right 05/25/2016   Procedure: SHOULDER ARTHROSCOPY WITH OPEN ROTATOR CUFF REPAIR;  Surgeon: Leanor Kail, MD;  Location: [facility] ORS;  Service: Orthopedics;  Laterality: Right;  . SUBACROMIAL DECOMPRESSION Right 05/25/2016   Procedure:  SUBACROMIAL DECOMPRESSION;  Surgeon: Leanor Kail, MD;  Location: [facility] ORS;  Service: Orthopedics;  Laterality: Right;    Social History:   reports that she quit smoking about 15 years ago. She has a 40.00 pack-year smoking history. She has never used smokeless tobacco. She reports that she does not drink alcohol and does not use drugs.  Allergies  Allergen Reactions  . Iodinated Diagnostic Agents Anaphylaxis    Other reaction(s): Other (See Comments) Throat swells and extreme hives  . Latex Itching  . Phenobarbital Hives  . Tape Rash    silicones    Family History  Problem Relation Age of Onset  . Breast cancer Paternal Aunt 54  . Diabetes Father   . Heart disease Father   . Breast cancer Daughter 35     Prior to Admission medications   Medication Sig Start Date End Date Taking? Authorizing Provider  apixaban (ELIQUIS) 5 MG TABS tablet Take 1 tablet (5 mg total) by mouth 2 (two) times daily. 06/04/20  Yes Vanga, Tally Due, MD  aspirin 81 MG tablet Take 81 mg by mouth daily.   Yes [provider]  atorvastatin (LIPITOR) 40 MG tablet Take 40 mg by mouth daily. 04/04/16  Yes [provider]  B Complex Vitamins (VITAMIN B COMPLEX PO) Take 1 tablet by mouth daily.   Yes [provider]  budesonide (ENTOCORT EC) 3 MG 24 hr capsule Take 3 capsules (9 mg total) by mouth daily. 06/04/20  Yes Vanga, Tally Due, MD  diphenhydrAMINE (BENADRYL) 25 mg capsule Take 25 mg by mouth daily.   Yes [provider]  furosemide (LASIX) 20 MG tablet Take 1 tablet (20 mg total) by mouth 2 (two) times daily. Patient taking differently: Take 20 mg by mouth daily.  08/08/16  Yes Demetrios Loll, MD  hydroxyurea (HYDREA) 500 MG capsule Take 1 pill twice a day on Mondays only and 1 pill a day by mouth on Tuesdays-Sundays. May take with food to minimize GI side effects. 03/19/20  Yes Corcoran, Drue Second, MD  levETIRAcetam (KEPPRA) 500 MG tablet Take 500 mg by mouth 2 (two)  times daily. 06/05/20  Yes [provider]  metFORMIN (GLUCOPHAGE) 500 MG tablet Take 500 mg by mouth 2 (two) times daily with a meal. 12/25/15  Yes [provider]  montelukast (SINGULAIR) 10 MG tablet TAKE 1 TABLET BY MOUTH EVERY DAY Patient taking differently: Take 10 mg by mouth daily.  07/02/15  Yes Kathrine Haddock, NP  Multiple Vitamin (MULTI-VITAMINS) TABS Take 1 tablet by mouth daily.   Yes [provider]  NIFEdipine (NIFEDICAL XL) 30 MG 24 hr tablet Take 30 mg by mouth daily. 09/30/15  Yes [provider]  OXYGEN Inhale 2 L into the lungs.   Yes [provider]  sitaGLIPtin (JANUVIA) 25 MG tablet Take 1 tablet by mouth daily. 01/01/18  Yes [provider]  umeclidinium-vilanterol (ANORO ELLIPTA) 62.5-25 MCG/INH AEPB Inhale 1 puff into the lungs daily. 02/06/20  Yes Scarboro, Audie Clear, NP  valsartan-hydrochlorothiazide (DIOVAN-HCT) 320-25 MG tablet Take 1 tablet by mouth daily. 04/14/16  Yes [provider]  vitamin C (ASCORBIC ACID)  500 MG tablet Take 500 mg by mouth 2 (two) times daily.   Yes [provider]  albuterol (ACCUNEB) 1.25 MG/3ML nebulizer solution Take 3 mLs (1.25 mg total) by nebulization every 6 (six) hours as needed. wheezing 05/03/19   Devona Konig A, MD  EPINEPHrine 0.3 mg/0.3 mL IJ SOAJ injection Inject 0.3 mg into the muscle as needed for anaphylaxis.  Patient not taking: Reported on 06/10/2020 01/24/19   [provider]  fluticasone (FLONASE) 50 MCG/ACT nasal spray Place 2 sprays into both nostrils daily. 08/08/19   Kendell Bane, NP  mesalamine (LIALDA) 1.2 g EC tablet Take 2 tablets (2.4 g total) by mouth 2 (two) times daily. 05/08/20 06/07/20  Lin Landsman, MD  VENTOLIN HFA 108 (90 Base) MCG/ACT inhaler INHALE 2 PUFFS INTO THE LUNGS EVERY 6 HOURS AS NEEDED Patient taking differently: Inhale 2 puffs into the lungs every 6 (six) hours as needed for wheezing or shortness of breath.  02/18/19    Kendell Bane, NP    Physical Exam: Vitals:   06/11/20 1608 06/11/20 1815 06/11/20 1816  BP: (!) 148/62  (!) 171/85  Pulse: 86  87  Resp: 22  (!) 24  Temp: 98.2 F (36.8 C)    TempSrc: Oral    SpO2: 95% 99% 99%  Weight: (!) 104.3 kg    Height: 5\' 7"  (1.702 m)      Constitutional: NAD, calm  Eyes: PERTLA, lids and conjunctivae normal ENMT: Mucous membranes are moist. Posterior pharynx clear of any exudate or lesions.   Neck: normal, supple, no masses, no thyromegaly Respiratory: Diminished breath sounds bilaterally with prolonged expiratory phase. Mild tachypnea. No accessory muscle use.  Cardiovascular: Rate ~60 and irregularly irregular. Mild bilateral lower extremity edema.   Abdomen: No distension, no tenderness, soft. Bowel sounds active.  Musculoskeletal: no clubbing / cyanosis. No joint deformity upper and lower extremities.   Skin: no significant rashes, lesions, ulcers. Warm, dry, well-perfused. Neurologic: CN 2-12 grossly intact. Sensation intact. Moving all extremities.  Psychiatric: Alert and oriented to person, place, and situation. Very pleasant and cooperative.    Labs and Imaging on Admission: I have personally reviewed following labs and imaging studies  CBC: Recent Labs  Lab 06/11/20 1003 06/11/20 1645  WBC 13.2* 12.8*  NEUTROABS 10.7* 10.5*  HGB 11.5* 11.8*  HCT 33.1* 32.9*  MCV 100.9* 99.4  PLT 366 485   Basic Metabolic Panel: Recent Labs  Lab 06/11/20 1003 06/11/20 1645  NA 138 139  K 3.5 3.4*  CL 109 108  CO2 19* 20*  GLUCOSE 101* 165*  BUN 30* 30*  CREATININE 1.08* 1.07*  CALCIUM 9.3 9.6   GFR: Estimated Creatinine Clearance: 54.7 mL/min (A) (by C-G formula based on SCr of 1.07 mg/dL (H)). Liver Function Tests: No results for input(s): AST, ALT, ALKPHOS, BILITOT, PROT, ALBUMIN in the last 168 hours. No results for input(s): LIPASE, AMYLASE in the last 168 hours. No results for input(s): AMMONIA in the last 168 hours. Coagulation  Profile: No results for input(s): INR, PROTIME in the last 168 hours. Cardiac Enzymes: No results for input(s): CKTOTAL, CKMB, CKMBINDEX, TROPONINI in the last 168 hours. BNP (last 3 results) No results for input(s): PROBNP in the last 8760 hours. HbA1C: No results for input(s): HGBA1C in the last 72 hours. CBG: No results for input(s): GLUCAP in the last 168 hours. Lipid Profile: No results for input(s): CHOL, HDL, LDLCALC, TRIG, CHOLHDL, LDLDIRECT in the last 72 hours. Thyroid Function Tests: Recent  Labs    06/11/20 1003  TSH 3.906   Anemia Panel: Recent Labs    06/11/20 1003  VITAMINB12 420  FOLATE 34.0  FERRITIN 107  TIBC 316  IRON 60   Urine analysis:    Component Value Date/Time   COLORURINE YELLOW (A) 06/01/2019 1046   APPEARANCEUR HAZY (A) 06/01/2019 1046   LABSPEC 1.006 06/01/2019 1046   PHURINE 6.0 06/01/2019 1046   GLUCOSEU NEGATIVE 06/01/2019 1046   HGBUR NEGATIVE 06/01/2019 1046   BILIRUBINUR NEGATIVE 06/01/2019 1046   KETONESUR NEGATIVE 06/01/2019 1046   PROTEINUR 30 (A) 06/01/2019 1046   NITRITE NEGATIVE 06/01/2019 1046   LEUKOCYTESUR TRACE (A) 06/01/2019 1046   Sepsis Labs: @LABRCNTIP (procalcitonin:4,lacticidven:4) )No results found for this or any previous visit (from the past 240 hour(s)).   Radiological Exams on Admission: DG Chest 2 View  Result Date: 06/11/2020 CLINICAL DATA:  Wheezing.  History of fallopian tube carcinoma EXAM: CHEST - 2 VIEW COMPARISON:  June 03, 2019 FINDINGS: There are small pleural effusions bilaterally, slightly larger on the left than on the right. There is no appreciable edema or airspace opacity. There is cardiomegaly. The pulmonary vascularity is normal. No adenopathy. There is aortic atherosclerosis. No bone lesions. IMPRESSION: Cardiomegaly with small pleural effusions bilaterally. A degree of congestive heart failure cannot be excluded. Note, however, that there is no demonstrable pulmonary edema. No adenopathy.  Aortic Atherosclerosis (ICD10-I70.0). Electronically Signed   By: Lowella Grip III M.D.   On: 06/11/2020 11:58    EKG: Independently reviewed. Atrial fibrillation, PVCs.   Assessment/Plan   1. COPD with acute exacerbation; chronic hypoxic respiratory    - Presents with increased SOB, mild productive cough, improved with Lasix, steroids, and breathing treatments in ED but continues to be dyspneic and hypoxic with minimal exertion  - Check sputum culture, continue systemic steroids, start azithromycin, continue breathing treatments   2. Acute on chronic HFpEF  - Presents with increased SOB and leg swelling, noted to have small bilateral effusions and increased heart size on CXR  - EF was preserved on echo from February 2020  - She was given 40 mg IV Lasix in ED and is diuresing  - Continue diuresis with Lasix 40 mg IV q12h, update echo, follow weights and I/Os, continue ARB, monitor electrolytes and renal function while diuresing    3. Atrial fibrillation  - In rate-controlled atrial fibrillation on admission  - CHADS-VASc is 51 (age x2, gender, HTN, DM, CHF)  - Continue Eliquis    4. CKD IIIa  - SCr is 1.07 on admission, similar to priors  - Renally-dose medications, monitor with daily BMP during diuresis    5. Seizure disorder  - Patient reports last seizure was 40 yrs ago and planning to come off of [place] soon but still taking 500 mg BID now, will continue    6. Type II DM  - A1c was 6.6% earlier this month  - She will be on systemic steroids as above  - Check CBGs and use SSI for now    7. Hypertension  - BP elevated in ED, anticipate improvement with diuresis, continue ARB and nifedipine, continue diuresis   8. Polycythemia  - Counts stable, continue Hydrea     DVT prophylaxis: Eliquis  Code Status: Full  Family Communication: Discussed with patient  Disposition Plan:  Patient is from: home  Anticipated d/c is to: Home  Anticipated d/c date is: 7/30 or  06/13/20 Patient currently: Dyspneic and hypoxic with minimal exertion  Consults  called: None Admission status: Observation     Vianne Bulls, MD [facility]  06/11/2020, 8:41 PM

## 2020-06-12 ENCOUNTER — Other Ambulatory Visit: Payer: Self-pay

## 2020-06-12 ENCOUNTER — Observation Stay
Admit: 2020-06-12 | Discharge: 2020-06-12 | Disposition: A | Discharge status: Home / Self Care | Payer: Medicare Other | Attending: Family Medicine | Admitting: Family Medicine

## 2020-06-12 DIAGNOSIS — Z7951 Long term (current) use of inhaled steroids: Secondary | ICD-10-CM | POA: Diagnosis not present

## 2020-06-12 DIAGNOSIS — J441 Chronic obstructive pulmonary disease with (acute) exacerbation: Secondary | ICD-10-CM | POA: Diagnosis not present

## 2020-06-12 DIAGNOSIS — N1831 Chronic kidney disease, stage 3a: Secondary | ICD-10-CM | POA: Diagnosis not present

## 2020-06-12 DIAGNOSIS — Z9981 Dependence on supplemental oxygen: Secondary | ICD-10-CM | POA: Diagnosis not present

## 2020-06-12 DIAGNOSIS — Z9989 Dependence on other enabling machines and devices: Secondary | ICD-10-CM

## 2020-06-12 DIAGNOSIS — I509 Heart failure, unspecified: Secondary | ICD-10-CM | POA: Diagnosis not present

## 2020-06-12 DIAGNOSIS — Z7984 Long term (current) use of oral hypoglycemic drugs: Secondary | ICD-10-CM | POA: Diagnosis not present

## 2020-06-12 DIAGNOSIS — I130 Hypertensive heart and chronic kidney disease with heart failure and stage 1 through stage 4 chronic kidney disease, or unspecified chronic kidney disease: Secondary | ICD-10-CM | POA: Diagnosis present

## 2020-06-12 DIAGNOSIS — D72829 Elevated white blood cell count, unspecified: Secondary | ICD-10-CM | POA: Diagnosis present

## 2020-06-12 DIAGNOSIS — Z7982 Long term (current) use of aspirin: Secondary | ICD-10-CM | POA: Diagnosis not present

## 2020-06-12 DIAGNOSIS — Z20822 Contact with and (suspected) exposure to covid-19: Secondary | ICD-10-CM | POA: Diagnosis present

## 2020-06-12 DIAGNOSIS — D649 Anemia, unspecified: Secondary | ICD-10-CM | POA: Diagnosis present

## 2020-06-12 DIAGNOSIS — R0602 Shortness of breath: Secondary | ICD-10-CM | POA: Diagnosis present

## 2020-06-12 DIAGNOSIS — I5033 Acute on chronic diastolic (congestive) heart failure: Secondary | ICD-10-CM | POA: Diagnosis present

## 2020-06-12 DIAGNOSIS — Z87891 Personal history of nicotine dependence: Secondary | ICD-10-CM | POA: Diagnosis not present

## 2020-06-12 DIAGNOSIS — J9611 Chronic respiratory failure with hypoxia: Secondary | ICD-10-CM | POA: Diagnosis present

## 2020-06-12 DIAGNOSIS — Z7901 Long term (current) use of anticoagulants: Secondary | ICD-10-CM | POA: Diagnosis not present

## 2020-06-12 DIAGNOSIS — Z96653 Presence of artificial knee joint, bilateral: Secondary | ICD-10-CM | POA: Diagnosis present

## 2020-06-12 DIAGNOSIS — E785 Hyperlipidemia, unspecified: Secondary | ICD-10-CM | POA: Diagnosis present

## 2020-06-12 DIAGNOSIS — Z79899 Other long term (current) drug therapy: Secondary | ICD-10-CM | POA: Diagnosis not present

## 2020-06-12 DIAGNOSIS — I482 Chronic atrial fibrillation, unspecified: Secondary | ICD-10-CM | POA: Diagnosis present

## 2020-06-12 DIAGNOSIS — D45 Polycythemia vera: Secondary | ICD-10-CM | POA: Diagnosis present

## 2020-06-12 DIAGNOSIS — I10 Essential (primary) hypertension: Secondary | ICD-10-CM | POA: Diagnosis not present

## 2020-06-12 DIAGNOSIS — Z90710 Acquired absence of both cervix and uterus: Secondary | ICD-10-CM | POA: Diagnosis not present

## 2020-06-12 DIAGNOSIS — G4730 Sleep apnea, unspecified: Secondary | ICD-10-CM | POA: Diagnosis present

## 2020-06-12 DIAGNOSIS — E1122 Type 2 diabetes mellitus with diabetic chronic kidney disease: Secondary | ICD-10-CM | POA: Diagnosis present

## 2020-06-12 DIAGNOSIS — G4733 Obstructive sleep apnea (adult) (pediatric): Secondary | ICD-10-CM

## 2020-06-12 DIAGNOSIS — G40909 Epilepsy, unspecified, not intractable, without status epilepticus: Secondary | ICD-10-CM | POA: Diagnosis present

## 2020-06-12 DIAGNOSIS — Z9111 Patient's noncompliance with dietary regimen: Secondary | ICD-10-CM | POA: Diagnosis not present

## 2020-06-12 LAB — ECHOCARDIOGRAM COMPLETE
AR max vel: 1.1 cm2
AV Area VTI: 1.18 cm2
AV Area mean vel: 1.09 cm2
AV Mean grad: 9 mmHg
AV Peak grad: 16.8 mmHg
Ao pk vel: 2.05 m/s
Area-P 1/2: 4.24 cm2
Height: 67 in
S' Lateral: 3.37 cm
Weight: 3680 oz

## 2020-06-12 LAB — BASIC METABOLIC PANEL
Anion gap: 9 (ref 5–15)
BUN: 30 mg/dL — ABNORMAL HIGH (ref 8–23)
CO2: 20 mmol/L — ABNORMAL LOW (ref 22–32)
Calcium: 8.9 mg/dL (ref 8.9–10.3)
Chloride: 108 mmol/L (ref 98–111)
Creatinine, Ser: 1 mg/dL (ref 0.44–1.00)
GFR calc Af Amer: >60 mL/min (ref >60)
GFR calc non Af Amer: 54 mL/min — ABNORMAL LOW (ref >60)
Glucose, Bld: 284 mg/dL — ABNORMAL HIGH (ref 70–99)
Potassium: 3.7 mmol/L (ref 3.5–5.1)
Sodium: 137 mmol/L (ref 135–145)

## 2020-06-12 LAB — CBC
HCT: 33.3 % — ABNORMAL LOW (ref 36.0–46.0)
Hemoglobin: 11.4 g/dL — ABNORMAL LOW (ref 12.0–15.0)
MCH: 34.9 pg — ABNORMAL HIGH (ref 26.0–34.0)
MCHC: 34.2 g/dL (ref 30.0–36.0)
MCV: 101.8 fL — ABNORMAL HIGH (ref 80.0–100.0)
Platelets: 342 10*3/uL (ref 150–400)
RBC: 3.27 MIL/uL — ABNORMAL LOW (ref 3.87–5.11)
RDW: 15.3 % (ref 11.5–15.5)
WBC: 11.4 10*3/uL — ABNORMAL HIGH (ref 4.0–10.5)
nRBC: 0 % (ref 0.0–0.2)

## 2020-06-12 LAB — GLUCOSE, CAPILLARY
Glucose-Capillary: 261 mg/dL — ABNORMAL HIGH (ref 70–99)
Glucose-Capillary: 234 mg/dL — ABNORMAL HIGH (ref 70–99)
Glucose-Capillary: 211 mg/dL — ABNORMAL HIGH (ref 70–99)
Glucose-Capillary: 194 mg/dL — ABNORMAL HIGH (ref 70–99)

## 2020-06-12 LAB — MAGNESIUM: Magnesium: 1.6 mg/dL — ABNORMAL LOW (ref 1.7–2.4)

## 2020-06-12 MED ORDER — HYDROXYUREA 500 MG PO CAPS: 500.0000 mg | ORAL_CAPSULE | ORAL | Status: DC

## 2020-06-12 MED ORDER — PREDNISONE 20 MG PO TABS
50.0000 mg | ORAL_TABLET | Freq: Every day | ORAL | Status: DC
  Administered 2020-06-13: 50 mg via ORAL
  Filled 2020-06-12: qty 1

## 2020-06-12 MED ORDER — HYDROXYUREA 500 MG PO CAPS
500.0000 mg | ORAL_CAPSULE | ORAL | Status: DC
  Administered 2020-06-12 – 2020-06-13 (×2): 500 mg via ORAL
  Filled 2020-06-12 (×2): qty 1

## 2020-06-12 NOTE — Progress Notes (Addendum)
[place] [facility] [place] NAME: Angelica Juarez    MR#:  [id number]  DATE OF BIRTH:  [birth year]  SUBJECTIVE:  came in with increasing shortness of breath. Husband in the room. Patient wondering when she can go home no chest pain  REVIEW OF SYSTEMS:   Review of Systems  Constitutional: Negative for chills, fever and weight loss.  HENT: Negative for ear discharge, ear pain and nosebleeds.   Eyes: Negative for blurred vision, pain and discharge.  Respiratory: Positive for shortness of breath. Negative for sputum production, wheezing and stridor.   Cardiovascular: Positive for leg swelling. Negative for chest pain, palpitations, orthopnea and PND.  Gastrointestinal: Negative for abdominal pain, diarrhea, nausea and vomiting.  Genitourinary: Negative for frequency and urgency.  Musculoskeletal: Negative for back pain and joint pain.  Neurological: Positive for weakness. Negative for sensory change, speech change and focal weakness.  Psychiatric/Behavioral: Negative for depression and hallucinations. The patient is not nervous/anxious.    Tolerating Diet:yes Tolerating PT: ambulatory  DRUG ALLERGIES:   Allergies  Allergen Reactions  . Iodinated Diagnostic Agents Anaphylaxis    Other reaction(s): Other (See Comments) Throat swells and extreme hives  . Latex Itching  . Phenobarbital Hives  . Tape Rash    silicones    VITALS:  Blood pressure (!) 163/81, pulse 95, temperature 98.2 F (36.8 C), temperature source Oral, resp. rate 22, height 5\' 7"  (1.702 m), weight (!) 104.3 kg, SpO2 94 %.  PHYSICAL EXAMINATION:   Physical Exam  GENERAL:  79 y.o.-year-old patient lying in the bed with no acute distress.  EYES: Pupils equal, round, reactive to light and accommodation. No scleral icterus.   HEENT: Head atraumatic, normocephalic. Oropharynx and nasopharynx clear.  NECK:  Supple, no jugular venous distention. No thyroid enlargement, no tenderness.   LUNGS: Normal breath sounds bilaterally, no wheezing ++ rales bilaterally with scattered rhonchi. No use of accessory muscles of respiration.  CARDIOVASCULAR: S1, S2 normal. No murmurs, rubs, or gallops.  ABDOMEN: Soft, nontender, nondistended. Bowel sounds present. No organomegaly or mass.  EXTREMITIES: No cyanosis, clubbing  ++edema b/l.    NEUROLOGIC: Cranial nerves II through XII are intact. No focal Motor or sensory deficits b/l.   PSYCHIATRIC:  patient is alert and oriented x 3.  SKIN: No obvious rash, lesion, or ulcer.   LABORATORY PANEL:  CBC Recent Labs  Lab 06/12/20 0415  WBC 11.4*  HGB 11.4*  HCT 33.3*  PLT 342    Chemistries  Recent Labs  Lab 06/12/20 0415  NA 137  K 3.7  CL 108  CO2 20*  GLUCOSE 284*  BUN 30*  CREATININE 1.00  CALCIUM 8.9  MG 1.6*   Cardiac Enzymes No results for input(s): TROPONINI in the last 168 hours. RADIOLOGY:  DG Chest 2 View  Result Date: 06/11/2020 CLINICAL DATA:  Wheezing.  History of fallopian tube carcinoma EXAM: CHEST - 2 VIEW COMPARISON:  June 03, 2019 FINDINGS: There are small pleural effusions bilaterally, slightly larger on the left than on the right. There is no appreciable edema or airspace opacity. There is cardiomegaly. The pulmonary vascularity is normal. No adenopathy. There is aortic atherosclerosis. No bone lesions. IMPRESSION: Cardiomegaly with small pleural effusions bilaterally. A degree of congestive heart failure cannot be excluded. Note, however, that there is no demonstrable pulmonary edema. No adenopathy. Aortic Atherosclerosis (ICD10-I70.0). Electronically Signed   By: Lowella Grip III M.D.   On: 06/11/2020 11:58   ECHOCARDIOGRAM COMPLETE  Result  Date: 06/12/2020    ECHOCARDIOGRAM REPORT   Patient Name:   Angelica Juarez Date of Exam: 06/12/2020 Medical Rec #:  [id number]   Height:       67.0 in Accession #:    [id number]  Weight:       230.0 lb Date of Birth:  1940-12-13   BSA:          2.146 m Patient Age:     79 years    BP:           181/70 mmHg Patient Gender: F           HR:           84 bpm. Exam Location:  [facility] Procedure: 2D Echo, Color Doppler and Cardiac Doppler Indications:     I50.31 CHF-Acute Diastolic  History:         Patient has prior history of Echocardiogram examinations. COPD;                  Risk Factors:Sleep Apnea, Hypertension, Dyslipidemia and                  Diabetes.  Sonographer:     Charmayne Sheer RDCS (AE) Referring Phys:  [id number] Ilene Qua OPYD Diagnosing Phys: Serafina Royals MD  Sonographer Comments: Suboptimal apical window and no subcostal window. IMPRESSIONS  1. Left ventricular ejection fraction, by estimation, is 50 to 55%. The left ventricle has low normal function. The left ventricle has no regional wall motion abnormalities. Left ventricular diastolic parameters were normal.  2. Right ventricular systolic function is normal. The right ventricular size is normal. There is severely elevated pulmonary artery systolic pressure.  3. Left atrial size was moderately dilated.  4. Right atrial size was moderately dilated.  5. The mitral valve is normal in structure. Mild to moderate mitral valve regurgitation.  6. Tricuspid valve regurgitation is mild to moderate.  7. The aortic valve is normal in structure. Aortic valve regurgitation is not visualized. FINDINGS  Left Ventricle: Left ventricular ejection fraction, by estimation, is 50 to 55%. The left ventricle has low normal function. The left ventricle has no regional wall motion abnormalities. The left ventricular internal cavity size was normal in size. There is no left ventricular hypertrophy. Left ventricular diastolic parameters were normal. Right Ventricle: The right ventricular size is normal. No increase in right ventricular wall thickness. Right ventricular systolic function is normal. There is severely elevated pulmonary artery systolic pressure. The tricuspid regurgitant velocity is 3.98 m/s, and with an assumed right atrial  pressure of 10 mmHg, the estimated right ventricular systolic pressure is 09.2 mmHg. Left Atrium: Left atrial size was moderately dilated. Right Atrium: Right atrial size was moderately dilated. Pericardium: There is no evidence of pericardial effusion. Mitral Valve: The mitral valve is normal in structure. Mild to moderate mitral valve regurgitation. MV peak gradient, 9.4 mmHg. The mean mitral valve gradient is 3.0 mmHg. Tricuspid Valve: The tricuspid valve is normal in structure. Tricuspid valve regurgitation is mild to moderate. Aortic Valve: The aortic valve is normal in structure. Aortic valve regurgitation is not visualized. Aortic valve mean gradient measures 9.0 mmHg. Aortic valve peak gradient measures 16.8 mmHg. Aortic valve area, by VTI measures 1.18 cm. Pulmonic Valve: The pulmonic valve was normal in structure. Pulmonic valve regurgitation is not visualized. Aorta: The aortic root and ascending aorta are structurally normal, with no evidence of dilitation. IAS/Shunts: No atrial level shunt detected by color flow Doppler.  LEFT VENTRICLE  PLAX 2D LVIDd:         5.65 cm  Diastology LVIDs:         3.37 cm  LV e' lateral:   8.92 cm/s LV PW:         1.32 cm  LV E/e' lateral: 15.4 LV IVS:        0.94 cm  LV e' medial:    9.57 cm/s LVOT diam:     1.80 cm  LV E/e' medial:  14.3 LV SV:         48 LV SV Index:   22 LVOT Area:     2.54 cm  RIGHT VENTRICLE RV Basal diam:  3.52 cm LEFT ATRIUM           Index       RIGHT ATRIUM           Index LA diam:      5.40 cm 2.52 cm/m  RA Area:     25.40 cm LA Vol (A4C): 56.1 ml 26.14 ml/m RA Volume:   80.30 ml  37.42 ml/m  AORTIC VALVE                    PULMONIC VALVE AV Area (Vmax):    1.10 cm     PV Vmax:       1.42 m/s AV Area (Vmean):   1.09 cm     PV Vmean:      83.800 cm/s AV Area (VTI):     1.18 cm     PV VTI:        0.261 m AV Vmax:           205.00 cm/s  PV Peak grad:  8.1 mmHg AV Vmean:          142.000 cm/s PV Mean grad:  3.0 mmHg AV VTI:            0.402  m AV Peak Grad:      16.8 mmHg AV Mean Grad:      9.0 mmHg LVOT Vmax:         88.80 cm/s LVOT Vmean:        61.100 cm/s LVOT VTI:          0.187 m LVOT/AV VTI ratio: 0.47  AORTA Ao Root diam: 2.90 cm MITRAL VALVE                TRICUSPID VALVE MV Area (PHT): 4.24 cm     TR Peak grad:   63.4 mmHg MV Peak grad:  9.4 mmHg     TR Vmax:        398.00 cm/s MV Mean grad:  3.0 mmHg MV Vmax:       1.53 m/s     SHUNTS MV Vmean:      77.0 cm/s    Systemic VTI:  0.19 m MV Decel Time: 179 msec     Systemic Diam: 1.80 cm MV E velocity: 137.00 cm/s MV A velocity: 59.60 cm/s MV E/A ratio:  2.30 Serafina Royals MD Electronically signed by Serafina Royals MD Signature Date/Time: 06/12/2020/12:21:50 PM    Final    ASSESSMENT AND PLAN:  Angelica Juarez is a 79 y.o. female with medical history significant for polycythemia vera, mild renal insufficiency, COPD with chronic hypoxic respiratory failure, chronic heart failure with preserved EF, seizure disorder, hypertension, and type 2 diabetes mellitus, now presenting to the emergency department with increased shortness of breath, cough, wheezing, and leg swelling  bilaterally.  Patient reports approximately 1 week of increased dyspnea, particularly with any exertion, notes a mild cough with some scant sputum production, denies fevers or chest pain, reports new bilateral lower leg swelling   1. COPD with acute exacerbation; chronic hypoxic respiratory    - Presents with increased SOB, mild productive cough -chest x-ray no evidence of pneumonia. Patient afebrile. -Discontinue IV antibiotics and observe -continue PO taper prednisone, inhalers and oxygen  2. Acute on chronic HFpEF -- due to dietary discretion. - Presents with increased SOB and leg swelling, noted to have small bilateral effusions and increased heart size on CXR  - EF was preserved on echo from February 2020  - She was given 40 mg IV Lasix in ED and is diuresing  - Continue diuresis with Lasix 40 mg IV q12h, update  echo, follow weights and I/Os, continue ARB, monitor electrolytes and renal function while diuresing   -patient reports she and her husband eats both the meals out in the restaurant every day. -Dietitian to see  3. Atrial fibrillation  - In rate-controlled atrial fibrillation on admission  - CHADS-VASc is 66 (age x2, gender, HTN, DM, CHF)  - Continue Eliquis    4. CKD IIIa  - SCr is 1.07 on admission, similar to priors  - Renally-dose medications, monitor with daily BMP during diuresis    5. Seizure disorder  - Patient reports last seizure was 40 yrs ago and planning to come off of [place] soon but still taking 500 mg BID now, will continue    6. Type II DM  - A1c was 6.6% earlier this month  - She will be on systemic steroids as above  - Check CBGs and use SSI for now    7. Hypertension  - BP elevated in ED, anticipate improvement with diuresis, continue ARB and nifedipine, continue diuresis   8. Polycythemia  - Counts stable, continue Hydrea   -follows with Dr. Mike Gip   DVT prophylaxis: Eliquis  Code Status: Full  Family Communication: Discussed with patient  and husband in the ER Disposition Plan:  Patient is from: home  Anticipated d/c is to: Home  Anticipated d/c date is: 7/30 or 06/13/20 Patient currently: Dyspneic and hypoxic with minimal exertion  Consults called: None Admission status:  inpatient    patient requires IV Lasix. She is still hypoxic which shortness of breath and exam has bilateral rales and being treated for congestive heart failure       TOTAL TIME TAKING CARE OF THIS PATIENT: 25 minutes.  >50% time spent on counselling and coordination of care  Note: This dictation was prepared with Dragon dictation along with smaller phrase technology. Any transcriptional errors that result from this process are unintentional.  Fritzi Mandes M.D    [facility]   CC: Primary care physician; Sofie Hartigan, MDPatient ID: Angelica Juarez,  female   DOB: 1941/03/01, 79 y.o.   MRN: [id number]

## 2020-06-12 NOTE — Plan of Care (Signed)
Nutrition Education Note  RD consulted for nutrition education regarding CHF.  79 y.o. female with medical history significant for polycythemia vera, mild renal insufficiency, COPD with chronic hypoxic respiratory failure, chronic heart failure with preserved EF, seizure disorder, hypertension, and type 2 diabetes mellitus, now presenting to the emergency department with increased shortness of breath, cough, wheezing and leg swelling bilaterally.   RD provided "Low Sodium Nutrition Therapy" handout from the Academy of [employer]. Reviewed patient's dietary recall. Provided examples on ways to decrease sodium intake in diet. Discouraged intake of processed foods and use of salt shaker. Encouraged fresh fruits and vegetables as well as whole grain sources of carbohydrates to maximize fiber intake.   RD discussed why it is important for patient to adhere to diet recommendations, and emphasized the role of fluids, foods to avoid, and importance of weighing self daily. Teach back method used.  Expect good compliance.  Body mass index is 36.02 kg/m. Pt meets criteria for obesity based on current BMI.  Current diet order is HH/CHO, patient is consuming approximately 100% of meals at this time. Labs and medications reviewed. No further nutrition interventions warranted at this time. RD contact information provided. If additional nutrition issues arise, please re-consult RD.   Koleen Distance MS, RD, LDN Please refer to [facility] for RD and/or RD on-call/weekend/after hours pager

## 2020-06-12 NOTE — Progress Notes (Signed)
Inpatient Diabetes Program Recommendations  AACE/ADA: New Consensus Statement on Inpatient Glycemic Control (2015)  Target Ranges:  Prepandial:   less than 140 mg/dL      Peak postprandial:   less than 180 mg/dL (1-2 hours)      Critically ill patients:  140 - 180 mg/dL   Results for Angelica Juarez, Angelica Juarez (MRN [id number]) as of 06/12/2020 13:45  Ref. Range 06/12/2020 09:18 06/12/2020 13:17  Glucose-Capillary Latest Ref Range: 70 - 99 mg/dL 261 (H) 234 (H)    Home DM Meds: Metformin 500 mg BID       Januvia 25 mg Daily   Current DM Meds: Novolog Sensitive Correction Scale/ SSI (0-9 units) TID AC + HS    Current A1c= 6.6% (tested at PCP office 05/29/2020)    MD- Note patient Started Solumedrol 40 mg BID this AM.  CBGs 200s today.    Please consider starting Lantus 10 units QHS while patient getting steroids (0.1 units/kg)     --Will follow patient during hospitalization--  Wyn Quaker RN, MSN, CDE Diabetes Coordinator Inpatient Glycemic Control Team Team Pager: [phone] (8a-5p)

## 2020-06-12 NOTE — Progress Notes (Signed)
*  PRELIMINARY RESULTS* Echocardiogram 2D Echocardiogram has been performed.  Angelica Juarez 06/12/2020, 11:11 AM

## 2020-06-13 LAB — BASIC METABOLIC PANEL
Anion gap: 10 (ref 5–15)
BUN: 27 mg/dL — ABNORMAL HIGH (ref 8–23)
CO2: 27 mmol/L (ref 22–32)
Calcium: 9.3 mg/dL (ref 8.9–10.3)
Chloride: 105 mmol/L (ref 98–111)
Creatinine, Ser: 0.95 mg/dL (ref 0.44–1.00)
GFR calc Af Amer: >60 mL/min (ref >60)
GFR calc non Af Amer: 58 mL/min — ABNORMAL LOW (ref >60)
Glucose, Bld: 138 mg/dL — ABNORMAL HIGH (ref 70–99)
Potassium: 3.1 mmol/L — ABNORMAL LOW (ref 3.5–5.1)
Sodium: 142 mmol/L (ref 135–145)

## 2020-06-13 LAB — CBC
HCT: 34.4 % — ABNORMAL LOW (ref 36.0–46.0)
Hemoglobin: 12.2 g/dL (ref 12.0–15.0)
MCH: 35.5 pg — ABNORMAL HIGH (ref 26.0–34.0)
MCHC: 35.5 g/dL (ref 30.0–36.0)
MCV: 100 fL (ref 80.0–100.0)
Platelets: 333 10*3/uL (ref 150–400)
RBC: 3.44 MIL/uL — ABNORMAL LOW (ref 3.87–5.11)
RDW: 15.6 % — ABNORMAL HIGH (ref 11.5–15.5)
WBC: 13.4 10*3/uL — ABNORMAL HIGH (ref 4.0–10.5)
nRBC: 0 % (ref 0.0–0.2)

## 2020-06-13 LAB — GLUCOSE, CAPILLARY
Glucose-Capillary: 155 mg/dL — ABNORMAL HIGH (ref 70–99)
Glucose-Capillary: 223 mg/dL — ABNORMAL HIGH (ref 70–99)

## 2020-06-13 MED ORDER — FUROSEMIDE 10 MG/ML IJ SOLN
40.0000 mg | Freq: Once | INTRAMUSCULAR | Status: AC
  Administered 2020-06-13: 40 mg via INTRAVENOUS
  Filled 2020-06-13: qty 4

## 2020-06-13 MED ORDER — MAGNESIUM OXIDE 400 (241.3 MG) MG PO TABS
400.0000 mg | ORAL_TABLET | Freq: Two times a day (BID) | ORAL | Status: DC
  Administered 2020-06-13: 400 mg via ORAL
  Filled 2020-06-13: qty 1

## 2020-06-13 MED ORDER — POTASSIUM CHLORIDE CRYS ER 20 MEQ PO TBCR
40.0000 meq | EXTENDED_RELEASE_TABLET | Freq: Once | ORAL | Status: AC
  Administered 2020-06-13: 40 meq via ORAL
  Filled 2020-06-13: qty 2

## 2020-06-13 MED ORDER — FUROSEMIDE 20 MG PO TABS: 20.0000 mg | ORAL_TABLET | Freq: Two times a day (BID) | ORAL | 0 refills | Status: DC

## 2020-06-13 NOTE — Progress Notes (Signed)
PHARMACY CONSULT NOTE - FOLLOW UP  Pharmacy Consult for Electrolyte Monitoring and Replacement   Recent Labs: Potassium (mmol/L)  Date Value  06/13/2020 3.1 (L)  02/20/2014 4.2   Magnesium (mg/dL)  Date Value  06/12/2020 1.6 (L)  09/18/2012 2.0   Calcium (mg/dL)  Date Value  06/13/2020 9.3   Calcium, Total (mg/dL)  Date Value  02/20/2014 8.8   Albumin (g/dL)  Date Value  05/14/2020 3.5  09/17/2012 2.6 (L)   Phosphorus (mg/dL)  Date Value  09/17/2012 2.2 (L)   Sodium (mmol/L)  Date Value  06/13/2020 142  02/20/2014 139   Assessment: Pharmacy consulted for electrolyte monitoring and replacement for 79 yo female admitted with increasing SOB and Chest pain.   Goal of Therapy:  Electrolyte WNL  Plan:  7/31: Mg: 1.6 yesterday and K: 3.1 this morning. Will order MagOx 400mg  BID and KCL 71mEq x 1 PO.   Will F/U with AM labs and continue to replace as needed.   Pernell Dupre, PharmD, BCPS Clinical Pharmacist 06/13/2020 11:39 AM

## 2020-06-13 NOTE — Discharge Summary (Addendum)
[facility] NAME: Angelica Juarez    MR#:  [id number]  DATE OF BIRTH:  1941-09-02  DATE OF ADMISSION:  06/11/2020 ADMITTING PHYSICIAN: Fritzi Mandes, MD  DATE OF DISCHARGE: 06/13/2020  PRIMARY CARE PHYSICIAN: Sofie Hartigan, MD    ADMISSION DIAGNOSIS:  COPD exacerbation ([place]) [J44.1] COPD with acute exacerbation ([place]) [J44.1] Acute on chronic congestive heart failure, unspecified heart failure type ([place]) [I50.9]  DISCHARGE DIAGNOSIS:  acute on chronic diastolic congestive heart failure chronic respiratory failure on chronic home oxygen due to chronic COPD SECONDARY DIAGNOSIS:   Past Medical History:  Diagnosis Date  . Allergy    Seasonal  . Arthritis   . Cancer [facility])    fallopian tubes- radiation  . Chronic kidney disease    chronic renal insufficiency  . Colon polyps 05/21/14  . COPD (chronic obstructive pulmonary disease) ([employer])   . Diabetes mellitus without complication ([place])   . Dyspnea   . Fracture closed, humerus, shaft    right   . History of kidney stones    40 years ago  . Hyperlipidemia 04/30/14  . Hypertension   . Impingement syndrome of right shoulder 12/29/15  . Personal history of radiation therapy   . Pneumonia    hx  . Rotator cuff tear 04/21/16   right  . Seizures ([place])   . Sleep apnea     HOSPITAL COURSE:   Angelica Juarez a 79 y.o.femalewith medical history significant forpolycythemia vera, mild renal insufficiency, COPD with chronic hypoxic respiratory failure, chronic heart failure with preserved EF, seizure disorder, hypertension, and type 2 diabetes mellitus, now presenting to the emergency department with increased shortness of breath, cough, wheezing, and leg swelling bilaterally. Patient reports approximately 1 week of increased dyspnea, particularly with any exertion, notes a mild cough with some scant sputum production, denies fevers or chest pain, reports new bilateral lower leg  swelling   1.Acute on chronic HFpEF-- due to dietary discretion. -Presents with increased SOB and leg swelling, noted to have small bilateral effusions and increased heart size on CXR -EF was preserved on echo from February 2020 -She was given 40 mg IV Lasix in ED and  Diuresed well-- clincally improved. No crackles/rales-- change to Lasix 20 mg BID -continue ARB, monitor electrolytes and renal function while diuresing -patient reports she and her husband eats both the meals out in the restaurant every day. -Dietitian input noted  2. COPD with acute exacerbation; chronic hypoxic respiratory -Presents with increased SOB, mild productive cough -chest x-ray no evidence of pneumonia. Patient afebrile. -Discontinue IV antibiotics and observe -received IV steroid. Patient currently not wheezing. Inhalers and oxygen -stop prednisone. She is already on Budesonide for G.I. issues -follow-up Dr. Yancey Flemings as outpatient  3.Atrial fibrillationchronic -In rate-controlled atrial fibrillation on admission -CHADS-VASc is 34 (age x2, gender, HTN, DM, CHF) -Continue Eliquis  4.CKD IIIa -SCr is 1.07 on admission, similar to priors -Renally-dose medications, monitor with daily BMP during diuresis -creatinine stable  5.Seizure disorder -Patient reports last seizure was 40 yrs ago and planning to come off of [place] soon but still taking 500 mg BID now, will continue  6.Type II DM -A1c was 6.6% earlier this month -resume home PO meds -Check CBGs and use SSI for now  7.Hypertension -BP elevated in ED, anticipate improvement with diuresis, continue ARB and nifedipine, continue diuresis  8.Polycythemia -Counts stable, continue Hydrea -follows with Dr. Mike Gip   DVT prophylaxis:Eliquis Code Status:Full Family Communication:Discussed with patient  and husband in the ER Disposition Plan: Patient is from:home Anticipated d/c is  LP:FXTK Anticipated d/c date is: 06/13/20 Patient currently: improving. Back to baseline with her chronic respiratory issues Consults called:None Admission status: inpatient     CONSULTS OBTAINED:    DRUG ALLERGIES:   Allergies  Allergen Reactions  . Iodinated Diagnostic Agents Anaphylaxis    Other reaction(s): Other (See Comments) Throat swells and extreme hives  . Latex Itching  . Phenobarbital Hives  . Tape Rash    silicones    DISCHARGE MEDICATIONS:   Allergies as of 06/13/2020      Reactions   Iodinated Diagnostic Agents Anaphylaxis   Other reaction(s): Other (See Comments) Throat swells and extreme hives   Latex Itching   Phenobarbital Hives   Tape Rash   silicones      Medication List    STOP taking these medications   EPINEPHrine 0.3 mg/0.3 mL Soaj injection Commonly known as: EPI-PEN     TAKE these medications   Anoro Ellipta 62.5-25 MCG/INH Aepb Generic drug: umeclidinium-vilanterol Inhale 1 puff into the lungs daily.   apixaban 5 MG Tabs tablet Commonly known as: ELIQUIS Take 1 tablet (5 mg total) by mouth 2 (two) times daily.   aspirin 81 MG tablet Take 81 mg by mouth daily.   atorvastatin 40 MG tablet Commonly known as: LIPITOR Take 40 mg by mouth daily.   budesonide 3 MG 24 hr capsule Commonly known as: ENTOCORT EC Take 3 capsules (9 mg total) by mouth daily.   diphenhydrAMINE 25 mg capsule Commonly known as: BENADRYL Take 25 mg by mouth daily.   fluticasone 50 MCG/ACT nasal spray Commonly known as: FLONASE Place 2 sprays into both nostrils daily.   furosemide 20 MG tablet Commonly known as: LASIX Take 1 tablet (20 mg total) by mouth 2 (two) times daily. What changed: when to take this   hydroxyurea 500 MG capsule Commonly known as: HYDREA Take 1 pill twice a day on Mondays only and 1 pill a day by mouth on Tuesdays-Sundays. May take with food to minimize GI side effects.   levETIRAcetam 500 MG tablet Commonly  known as: KEPPRA Take 500 mg by mouth 2 (two) times daily.   mesalamine 1.2 g EC tablet Commonly known as: Lialda Take 2 tablets (2.4 g total) by mouth 2 (two) times daily.   metFORMIN 500 MG tablet Commonly known as: GLUCOPHAGE Take 500 mg by mouth 2 (two) times daily with a meal.   montelukast 10 MG tablet Commonly known as: SINGULAIR TAKE 1 TABLET BY MOUTH EVERY DAY   Multi-Vitamins Tabs Take 1 tablet by mouth daily.   Nifedical XL 30 MG 24 hr tablet Generic drug: NIFEdipine Take 30 mg by mouth daily.   OXYGEN Inhale 2 L into the lungs.   sitaGLIPtin 25 MG tablet Commonly known as: JANUVIA Take 1 tablet by mouth daily.   valsartan-hydrochlorothiazide 320-25 MG tablet Commonly known as: DIOVAN-HCT Take 1 tablet by mouth daily.   Ventolin HFA 108 (90 Base) MCG/ACT inhaler Generic drug: albuterol INHALE 2 PUFFS INTO THE LUNGS EVERY 6 HOURS AS NEEDED What changed: See the new instructions.   albuterol 1.25 MG/3ML nebulizer solution Commonly known as: ACCUNEB Take 3 mLs (1.25 mg total) by nebulization every 6 (six) hours as needed. wheezing What changed: Another medication with the same name was changed. Make sure you understand how and when to take each.   VITAMIN B COMPLEX PO Take 1 tablet by mouth daily.  vitamin C 500 MG tablet Commonly known as: ASCORBIC ACID Take 500 mg by mouth 2 (two) times daily.       If you experience worsening of your admission symptoms, develop shortness of breath, life threatening emergency, suicidal or homicidal thoughts you must seek medical attention immediately by calling 911 or calling your MD immediately  if symptoms less severe.  You Must read complete instructions/literature along with all the possible adverse reactions/side effects for all the Medicines you take and that have been prescribed to you. Take any new Medicines after you have completely understood and accept all the possible adverse reactions/side effects.    Please note  You were cared for by a hospitalist during your hospital stay. If you have any questions about your discharge medications or the care you received while you were in the hospital after you are discharged, you can call the unit and asked to speak with the hospitalist on call if the hospitalist that took care of you is not available. Once you are discharged, your primary care physician will handle any further medical issues. Please note that NO REFILLS for any discharge medications will be authorized once you are discharged, as it is imperative that you return to your primary care physician (or establish a relationship with a primary care physician if you do not have one) for your aftercare needs so that they can reassess your need for medications and monitor your lab values. Today   SUBJECTIVE   I feel a lot better. Slept good. Urine output more than 2 1/2 L breathing comfortably  VITAL SIGNS:  Blood pressure (!) 152/75, pulse 101, temperature 97.8 F (36.6 C), resp. rate 18, height 5\' 7"  (1.702 m), weight (!) 106.6 kg, SpO2 94 %.  I/O:    Intake/Output Summary (Last 24 hours) at 06/13/2020 1206 Last data filed at 06/13/2020 5277 Gross per 24 hour  Intake 0 ml  Output 2600 ml  Net -2600 ml    PHYSICAL EXAMINATION:  GENERAL:  79 y.o.-year-old patient lying in the bed with no acute distress.  EYES: Pupils equal, round, reactive to light and accommodation. No scleral icterus.  HEENT: Head atraumatic, normocephalic. Oropharynx and nasopharynx clear.  NECK:  Supple, no jugular venous distention. No thyroid enlargement, no tenderness.  LUNGS: decreased breath sounds bilaterally, no wheezing, no rales,rhonchi or crepitation. No use of accessory muscles of respiration.  CARDIOVASCULAR: S1, S2 normal. No murmurs, rubs, or gallops.  ABDOMEN: Soft, non-tender, non-distended. Bowel sounds present. No organomegaly or mass.  EXTREMITIES: + pedal edema,  No cyanosis, or clubbing.   NEUROLOGIC: Cranial nerves II through XII are intact. Muscle strength 5/5 in all extremities. Sensation intact. Gait not checked.  PSYCHIATRIC:  patient is alert and oriented x 3.  SKIN: No obvious rash, lesion, or ulcer.   DATA REVIEW:   CBC  Recent Labs  Lab 06/13/20 0531  WBC 13.4*  HGB 12.2  HCT 34.4*  PLT 333    Chemistries  Recent Labs  Lab 06/12/20 0415 06/12/20 0415 06/13/20 0531  NA 137   < > 142  K 3.7   < > 3.1*  CL 108   < > 105  CO2 20*   < > 27  GLUCOSE 284*   < > 138*  BUN 30*   < > 27*  CREATININE 1.00   < > 0.95  CALCIUM 8.9   < > 9.3  MG 1.6*  --   --    < > = values in  this interval not displayed.    Microbiology Results   Recent Results (from the past 240 hour(s))  SARS Coronavirus 2 by RT PCR (hospital order, performed in [facility] hospital lab) Nasopharyngeal Nasopharyngeal Swab     Status: None   Collection Time: 06/11/20  8:13 PM   Specimen: Nasopharyngeal Swab  Result Value Ref Range Status   SARS Coronavirus 2 NEGATIVE NEGATIVE Final    Comment: (NOTE) SARS-CoV-2 target nucleic acids are NOT DETECTED.  The SARS-CoV-2 RNA is generally detectable in upper and lower respiratory specimens during the acute phase of infection. The lowest concentration of SARS-CoV-2 viral copies this assay can detect is 250 copies / mL. A negative result does not preclude SARS-CoV-2 infection and should not be used as the sole basis for treatment or other patient management decisions.  A negative result may occur with improper specimen collection / handling, submission of specimen other than nasopharyngeal swab, presence of viral mutation(s) within the areas targeted by this assay, and inadequate number of viral copies (<250 copies / mL). A negative result must be combined with clinical observations, patient history, and epidemiological information.  Fact Sheet for Patients:   StrictlyIdeas.no  Fact Sheet for Healthcare  Providers: BankingDealers.co.za  This test is not yet approved or  cleared by the [place] FDA and has been authorized for detection and/or diagnosis of SARS-CoV-2 by FDA under an Emergency Use Authorization (EUA).  This EUA will remain in effect (meaning this test can be used) for the duration of the COVID-19 declaration under Section 564(b)(1) of the Act, 21 U.S.C. section 360bbb-3(b)(1), unless the authorization is terminated or revoked sooner.  Performed at [facility], [address] [place], [place] [address]     RADIOLOGY:  ECHOCARDIOGRAM COMPLETE  Result Date: 06/12/2020    ECHOCARDIOGRAM REPORT   Patient Name:   JINNA WEINMAN Mcclanahan Date of Exam: 06/12/2020 Medical Rec #:  [id number]   Height:       67.0 in Accession #:    [id number]  Weight:       230.0 lb Date of Birth:  08-28-1941   BSA:          2.146 m Patient Age:    79 years    BP:           181/70 mmHg Patient Gender: F           HR:           84 bpm. Exam Location:  [facility] Procedure: 2D Echo, Color Doppler and Cardiac Doppler Indications:     I50.31 CHF-Acute Diastolic  History:         Patient has prior history of Echocardiogram examinations. COPD;                  Risk Factors:Sleep Apnea, Hypertension, Dyslipidemia and                  Diabetes.  Sonographer:     Charmayne Sheer RDCS (AE) Referring Phys:  [id number] Ilene Qua OPYD Diagnosing Phys: Serafina Royals MD  Sonographer Comments: Suboptimal apical window and no subcostal window. IMPRESSIONS  1. Left ventricular ejection fraction, by estimation, is 50 to 55%. The left ventricle has low normal function. The left ventricle has no regional wall motion abnormalities. Left ventricular diastolic parameters were normal.  2. Right ventricular systolic function is normal. The right ventricular size is normal. There is severely elevated pulmonary artery systolic pressure.  3. Left atrial size was moderately dilated.  4. Right atrial size was moderately dilated.   5. The mitral valve is normal in structure. Mild to moderate mitral valve regurgitation.  6. Tricuspid valve regurgitation is mild to moderate.  7. The aortic valve is normal in structure. Aortic valve regurgitation is not visualized. FINDINGS  Left Ventricle: Left ventricular ejection fraction, by estimation, is 50 to 55%. The left ventricle has low normal function. The left ventricle has no regional wall motion abnormalities. The left ventricular internal cavity size was normal in size. There is no left ventricular hypertrophy. Left ventricular diastolic parameters were normal. Right Ventricle: The right ventricular size is normal. No increase in right ventricular wall thickness. Right ventricular systolic function is normal. There is severely elevated pulmonary artery systolic pressure. The tricuspid regurgitant velocity is 3.98 m/s, and with an assumed right atrial pressure of 10 mmHg, the estimated right ventricular systolic pressure is 00.7 mmHg. Left Atrium: Left atrial size was moderately dilated. Right Atrium: Right atrial size was moderately dilated. Pericardium: There is no evidence of pericardial effusion. Mitral Valve: The mitral valve is normal in structure. Mild to moderate mitral valve regurgitation. MV peak gradient, 9.4 mmHg. The mean mitral valve gradient is 3.0 mmHg. Tricuspid Valve: The tricuspid valve is normal in structure. Tricuspid valve regurgitation is mild to moderate. Aortic Valve: The aortic valve is normal in structure. Aortic valve regurgitation is not visualized. Aortic valve mean gradient measures 9.0 mmHg. Aortic valve peak gradient measures 16.8 mmHg. Aortic valve area, by VTI measures 1.18 cm. Pulmonic Valve: The pulmonic valve was normal in structure. Pulmonic valve regurgitation is not visualized. Aorta: The aortic root and ascending aorta are structurally normal, with no evidence of dilitation. IAS/Shunts: No atrial level shunt detected by color flow Doppler.  LEFT VENTRICLE  PLAX 2D LVIDd:         5.65 cm  Diastology LVIDs:         3.37 cm  LV e' lateral:   8.92 cm/s LV PW:         1.32 cm  LV E/e' lateral: 15.4 LV IVS:        0.94 cm  LV e' medial:    9.57 cm/s LVOT diam:     1.80 cm  LV E/e' medial:  14.3 LV SV:         48 LV SV Index:   22 LVOT Area:     2.54 cm  RIGHT VENTRICLE RV Basal diam:  3.52 cm LEFT ATRIUM           Index       RIGHT ATRIUM           Index LA diam:      5.40 cm 2.52 cm/m  RA Area:     25.40 cm LA Vol (A4C): 56.1 ml 26.14 ml/m RA Volume:   80.30 ml  37.42 ml/m  AORTIC VALVE                    PULMONIC VALVE AV Area (Vmax):    1.10 cm     PV Vmax:       1.42 m/s AV Area (Vmean):   1.09 cm     PV Vmean:      83.800 cm/s AV Area (VTI):     1.18 cm     PV VTI:        0.261 m AV Vmax:           205.00 cm/s  PV Peak grad:  8.1  mmHg AV Vmean:          142.000 cm/s PV Mean grad:  3.0 mmHg AV VTI:            0.402 m AV Peak Grad:      16.8 mmHg AV Mean Grad:      9.0 mmHg LVOT Vmax:         88.80 cm/s LVOT Vmean:        61.100 cm/s LVOT VTI:          0.187 m LVOT/AV VTI ratio: 0.47  AORTA Ao Root diam: 2.90 cm MITRAL VALVE                TRICUSPID VALVE MV Area (PHT): 4.24 cm     TR Peak grad:   63.4 mmHg MV Peak grad:  9.4 mmHg     TR Vmax:        398.00 cm/s MV Mean grad:  3.0 mmHg MV Vmax:       1.53 m/s     SHUNTS MV Vmean:      77.0 cm/s    Systemic VTI:  0.19 m MV Decel Time: 179 msec     Systemic Diam: 1.80 cm MV E velocity: 137.00 cm/s MV A velocity: 59.60 cm/s MV E/A ratio:  2.30 Serafina Royals MD Electronically signed by Serafina Royals MD Signature Date/Time: 06/12/2020/12:21:50 PM    Final      CODE STATUS:     Code Status Orders  (From admission, onward)         Start     Ordered   06/11/20 2049  Full code  Continuous        06/11/20 2048        Code Status History    Date Active Date Inactive Code Status Order ID Comments User Context   06/01/2019 1753 06/03/2019 1801 Full Code [id number]  Sela Hua, MD Inpatient   08/06/2016  1639 08/08/2016 1545 Full Code [id number]  Henreitta Leber, MD Inpatient   Advance Care Planning Activity    Advance Directive Documentation     Most Recent Value  Type of Advance Directive Healthcare Power of Attorney  Pre-existing out of facility DNR order (yellow form or pink MOST form) --  "MOST" Form in Place? --       TOTAL TIME TAKING CARE OF THIS PATIENT: **35* minutes.    Fritzi Mandes M.D  [facility]    CC: Primary care physician; Sofie Hartigan, MD

## 2020-06-13 NOTE — Progress Notes (Signed)
Patient d/c home. Husband to transport patient home. Discharge instructions given to patient, verbalized understanding.

## 2020-06-13 NOTE — Discharge Instructions (Signed)
Use your oxygen, inhalers and nebulizer as before. °

## 2020-06-22 ENCOUNTER — Emergency Department
Admission: EM | Admit: 2020-06-22 | Discharge: 2020-06-22 | Disposition: A | Discharge status: Home / Self Care | Payer: Medicare Other | Attending: Emergency Medicine | Admitting: Emergency Medicine

## 2020-06-22 ENCOUNTER — Other Ambulatory Visit: Payer: Self-pay

## 2020-06-22 ENCOUNTER — Encounter: Payer: Self-pay | Admitting: Emergency Medicine

## 2020-06-22 ENCOUNTER — Emergency Department: Payer: Medicare Other

## 2020-06-22 DIAGNOSIS — R42 Dizziness and giddiness: Secondary | ICD-10-CM | POA: Diagnosis not present

## 2020-06-22 DIAGNOSIS — Z5321 Procedure and treatment not carried out due to patient leaving prior to being seen by health care provider: Secondary | ICD-10-CM | POA: Diagnosis not present

## 2020-06-22 DIAGNOSIS — R0602 Shortness of breath: Secondary | ICD-10-CM | POA: Insufficient documentation

## 2020-06-22 DIAGNOSIS — I509 Heart failure, unspecified: Secondary | ICD-10-CM | POA: Diagnosis not present

## 2020-06-22 LAB — CBC
HCT: 34.3 % — ABNORMAL LOW (ref 36.0–46.0)
Hemoglobin: 11.2 g/dL — ABNORMAL LOW (ref 12.0–15.0)
MCH: 34.8 pg — ABNORMAL HIGH (ref 26.0–34.0)
MCHC: 32.7 g/dL (ref 30.0–36.0)
MCV: 106.5 fL — ABNORMAL HIGH (ref 80.0–100.0)
Platelets: 294 10*3/uL (ref 150–400)
RBC: 3.22 MIL/uL — ABNORMAL LOW (ref 3.87–5.11)
RDW: 15.1 % (ref 11.5–15.5)
WBC: 12.3 10*3/uL — ABNORMAL HIGH (ref 4.0–10.5)
nRBC: 0 % (ref 0.0–0.2)

## 2020-06-22 LAB — BASIC METABOLIC PANEL
Anion gap: 9 (ref 5–15)
BUN: 24 mg/dL — ABNORMAL HIGH (ref 8–23)
CO2: 22 mmol/L (ref 22–32)
Calcium: 9.3 mg/dL (ref 8.9–10.3)
Chloride: 108 mmol/L (ref 98–111)
Creatinine, Ser: 1.04 mg/dL — ABNORMAL HIGH (ref 0.44–1.00)
GFR calc Af Amer: >60 mL/min (ref >60)
GFR calc non Af Amer: 52 mL/min — ABNORMAL LOW (ref >60)
Glucose, Bld: 143 mg/dL — ABNORMAL HIGH (ref 70–99)
Potassium: 3.7 mmol/L (ref 3.5–5.1)
Sodium: 139 mmol/L (ref 135–145)

## 2020-06-22 LAB — TROPONIN I (HIGH SENSITIVITY): Troponin I (High Sensitivity): 15 ng/L (ref <18)

## 2020-06-22 NOTE — ED Notes (Signed)
Patient reports feeling much better and wanting to go home.  This RN encouraged patient to return to the ED if needed and to follow up with PCP/Cardiology.  Patient verbalized understanding.  This RN removed IV that EMS placed to patient's left hand.

## 2020-06-22 NOTE — ED Triage Notes (Signed)
Patient is complaining of shortness of breath and states she feels like there is an elephant sitting on her ribs.  Patient was recently admitted for CHF.

## 2020-06-22 NOTE — ED Triage Notes (Signed)
Ems from  Home.  Recently discharged after CHF admission.  Short of breath 28-30/min.  A fib with hisotry of same with pvcs.  Has not taken lasix since 9pm.  Feels dizzy when standing.  20g l hand  [id number].

## 2020-06-25 NOTE — Progress Notes (Deleted)
   Patient ID: Angelica Juarez, female    DOB: October 02, 1941, 79 y.o.   MRN: [id number]  HPI  Angelica Juarez is a 79 y/o female with a history of  Echo report from 06/12/20 reviewed and showed an EF of 50-55% along with severely elevated PA pressure, moderate LAE and mild/moderate MR/TR.    Review of Systems    Physical Exam    Assessment & Plan:  1: Chronic heart failure with preserved ejection fraction along with structural changes- - NYHA class  2:

## 2020-06-26 ENCOUNTER — Ambulatory Visit: Payer: Medicare Other | Admitting: Family

## 2020-06-30 ENCOUNTER — Ambulatory Visit: Payer: Medicare Other | Admitting: Family

## 2020-07-01 ENCOUNTER — Telehealth: Payer: Self-pay

## 2020-07-01 NOTE — Telephone Encounter (Signed)
Patient called and left a voicemail on my phone that she was having some side effects to medication that Dr. Marius Ditch prescribed for her. Tried to call patient back and the man that answer said she would give [place] a call back when she could.

## 2020-07-08 ENCOUNTER — Inpatient Hospital Stay: Payer: Medicare Other | Attending: Hematology and Oncology

## 2020-07-08 ENCOUNTER — Other Ambulatory Visit: Payer: Self-pay

## 2020-07-08 DIAGNOSIS — I509 Heart failure, unspecified: Secondary | ICD-10-CM | POA: Insufficient documentation

## 2020-07-08 DIAGNOSIS — M129 Arthropathy, unspecified: Secondary | ICD-10-CM | POA: Insufficient documentation

## 2020-07-08 DIAGNOSIS — Z87442 Personal history of urinary calculi: Secondary | ICD-10-CM | POA: Diagnosis not present

## 2020-07-08 DIAGNOSIS — Z7984 Long term (current) use of oral hypoglycemic drugs: Secondary | ICD-10-CM | POA: Insufficient documentation

## 2020-07-08 DIAGNOSIS — Z87891 Personal history of nicotine dependence: Secondary | ICD-10-CM | POA: Diagnosis not present

## 2020-07-08 DIAGNOSIS — N189 Chronic kidney disease, unspecified: Secondary | ICD-10-CM | POA: Insufficient documentation

## 2020-07-08 DIAGNOSIS — R42 Dizziness and giddiness: Secondary | ICD-10-CM | POA: Insufficient documentation

## 2020-07-08 DIAGNOSIS — Z803 Family history of malignant neoplasm of breast: Secondary | ICD-10-CM | POA: Diagnosis not present

## 2020-07-08 DIAGNOSIS — I130 Hypertensive heart and chronic kidney disease with heart failure and stage 1 through stage 4 chronic kidney disease, or unspecified chronic kidney disease: Secondary | ICD-10-CM | POA: Diagnosis not present

## 2020-07-08 DIAGNOSIS — Z7982 Long term (current) use of aspirin: Secondary | ICD-10-CM | POA: Diagnosis not present

## 2020-07-08 DIAGNOSIS — G4730 Sleep apnea, unspecified: Secondary | ICD-10-CM | POA: Insufficient documentation

## 2020-07-08 DIAGNOSIS — D539 Nutritional anemia, unspecified: Secondary | ICD-10-CM | POA: Diagnosis not present

## 2020-07-08 DIAGNOSIS — Z86010 Personal history of colonic polyps: Secondary | ICD-10-CM | POA: Insufficient documentation

## 2020-07-08 DIAGNOSIS — Z79899 Other long term (current) drug therapy: Secondary | ICD-10-CM | POA: Insufficient documentation

## 2020-07-08 DIAGNOSIS — Z7901 Long term (current) use of anticoagulants: Secondary | ICD-10-CM | POA: Diagnosis not present

## 2020-07-08 DIAGNOSIS — E119 Type 2 diabetes mellitus without complications: Secondary | ICD-10-CM | POA: Diagnosis not present

## 2020-07-08 DIAGNOSIS — J449 Chronic obstructive pulmonary disease, unspecified: Secondary | ICD-10-CM | POA: Insufficient documentation

## 2020-07-08 DIAGNOSIS — J90 Pleural effusion, not elsewhere classified: Secondary | ICD-10-CM | POA: Insufficient documentation

## 2020-07-08 DIAGNOSIS — D45 Polycythemia vera: Secondary | ICD-10-CM | POA: Diagnosis not present

## 2020-07-08 LAB — CBC WITH DIFFERENTIAL/PLATELET
Abs Immature Granulocytes: 0.08 10*3/uL — ABNORMAL HIGH (ref 0.00–0.07)
Basophils Absolute: 0.1 10*3/uL (ref 0.0–0.1)
Basophils Relative: 1 %
Eosinophils Absolute: 0.3 10*3/uL (ref 0.0–0.5)
Eosinophils Relative: 3 %
HCT: 34.7 % — ABNORMAL LOW (ref 36.0–46.0)
Hemoglobin: 11.5 g/dL — ABNORMAL LOW (ref 12.0–15.0)
Immature Granulocytes: 1 %
Lymphocytes Relative: 5 %
Lymphs Abs: 0.5 10*3/uL — ABNORMAL LOW (ref 0.7–4.0)
MCH: 34.4 pg — ABNORMAL HIGH (ref 26.0–34.0)
MCHC: 33.1 g/dL (ref 30.0–36.0)
MCV: 103.9 fL — ABNORMAL HIGH (ref 80.0–100.0)
Monocytes Absolute: 1 10*3/uL (ref 0.1–1.0)
Monocytes Relative: 9 %
Neutro Abs: 8.9 10*3/uL — ABNORMAL HIGH (ref 1.7–7.7)
Neutrophils Relative %: 81 %
Platelets: 289 10*3/uL (ref 150–400)
RBC: 3.34 MIL/uL — ABNORMAL LOW (ref 3.87–5.11)
RDW: 14.4 % (ref 11.5–15.5)
WBC: 11 10*3/uL — ABNORMAL HIGH (ref 4.0–10.5)
nRBC: 0 % (ref 0.0–0.2)

## 2020-07-08 NOTE — Progress Notes (Signed)
[facility]  [address] [place], [place] [address] Phone: [phone]  Fax: [phone]   Clinic Day:  07/09/2020  Referring physician: Sofie Hartigan, MD  Chief Complaint: Angelica Juarez is a 79 y.o. 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   HPI: The patient was last seen in the hematology clinic on 06/11/2020. At that time, she felt lightheaded.  Exam revealed right lower lobe crackles and some wheezing. Hematocrit was 33.1, hemoglobin 11.5, MCV 100.9, platelets 366,000, WBC 13,200.  BUN was 30 and creatinine 1.08 (CrCl 49 ml/min). Ferritin was 107 with an iron saturation of 19% and a TIBC of 316. Vitamin B12 was 420 and folate 34.0. TSH was 3.906.  CXR on 06/11/2020 revealed cardiomegaly with small pleural effusions bilaterally. A degree of congestive heart failure could not be excluded. There was no demonstrable pulmonary edema. There was no adenopathy. There was aortic atherosclerosis.  The patient was admitted to [facility] from 06/11/2020 - 06/13/2020 for COPD exacerbation and acute on chronic heart failure. Echocardiogram revealed an EF of 50-55%. She received Lasix. She was discharged and instructed to continue Eliquis and Lasix dose increased to 20 mg BID.  Labs on 06/22/2020 revealed a hematocrit of 34.3, hemoglobin 11.2, MCV 106.5, platelets 294,000, WBC 12,300. BUN 24 and creatinine 1.04 (CrCl 52 ml/min).  During the interim, she has been okay. She is currently on 2 liters/min oxygen. She feels that her symptoms are the same. She still gets short of breath on exertion and has diarrhea. She developed some new right foot soreness and denies any trauma or injury to the area. She denies chest pain and palpitations. Her lightheadedness has improved. She still has insomnia and did not sleep at all last night.   Past Medical History:  Diagnosis Date  . Allergy    Seasonal  . Arthritis   . 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 Chronic kidney disease    chronic renal insufficiency  . Colon polyps 05/21/14  . COPD (chronic obstructive pulmonary disease) ([place])   . Diabetes mellitus without complication ([place])   . Dyspnea   . Fracture closed, humerus, shaft    right   . History of kidney stones    40 years ago  . Hyperlipidemia 04/30/14  . Hypertension   . Impingement syndrome of right shoulder 12/29/15  . Personal history of radiation therapy   . Pneumonia    hx  . Rotator cuff tear 04/21/16   right  . Seizures ([place])   . Sleep apnea     Past Surgical History:  Procedure Laterality Date  . ABDOMINAL HYSTERECTOMY    . COLONOSCOPY WITH PROPOFOL N/A 06/01/2020   Procedure: COLONOSCOPY WITH PROPOFOL;  Surgeon: Lin Landsman, MD;  Location: [facility] ENDOSCOPY;  Service: Gastroenterology;  Laterality: N/A;  . ESOPHAGOGASTRODUODENOSCOPY (EGD) WITH PROPOFOL N/A 06/01/2020   Procedure: ESOPHAGOGASTRODUODENOSCOPY (EGD) WITH PROPOFOL;  Surgeon: Lin Landsman, MD;  Location: [facility] ENDOSCOPY;  Service: Gastroenterology;  Laterality: N/A;  . 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 EYE SURGERY     bilateral cataracts  . JOINT REPLACEMENT     bilateral knee replacement  . NASAL SEPTUM SURGERY    . OOPHORECTOMY    . REPLACEMENT TOTAL KNEE Bilateral 2004  . SHOULDER ARTHROSCOPY WITH BICEPSTENOTOMY Right 05/25/2016   Procedure: SHOULDER ARTHROSCOPY WITH BICEPSTENOTOMY;  Surgeon: Leanor Kail, MD;  Location: [facility] ORS;  Service: Orthopedics;  Laterality: Right;  . SHOULDER ARTHROSCOPY WITH DISTAL CLAVICLE RESECTION Right 05/25/2016   Procedure: SHOULDER ARTHROSCOPY WITH DISTAL CLAVICLE RESECTION;  Surgeon: Leanor Kail, MD;  Location: [facility] ORS;  Service: Orthopedics;  Laterality: Right;  . SHOULDER ARTHROSCOPY WITH OPEN ROTATOR CUFF REPAIR Right 05/25/2016   Procedure: SHOULDER ARTHROSCOPY WITH OPEN ROTATOR CUFF REPAIR;  Surgeon: Leanor Kail, MD;  Location: [facility] ORS;   Service: Orthopedics;  Laterality: Right;  . SUBACROMIAL DECOMPRESSION Right 05/25/2016   Procedure: SUBACROMIAL DECOMPRESSION;  Surgeon: Leanor Kail, MD;  Location: [facility] ORS;  Service: Orthopedics;  Laterality: Right;    Family History  Problem Relation Age of Onset  . Breast cancer Paternal Aunt 110  . Diabetes Father   . Heart disease Father   . Breast cancer Daughter 84    Social History:  reports that she quit smoking about 15 years ago. She has a 40.00 pack-year smoking history. She has never used smokeless tobacco. She reports that she does not drink alcohol and does not use drugs. Patient moved to [place] in 2001 from [place]. Patient is a former 1 ppd smoker x 40 years; quit in 2006. She is retired from [employer]. Patient denies known exposures to radiation on toxins. Her significant other is [employer]. She lives in [place]. She lost her daughter to a heart attack last month. The patient is alone today.   Allergies:  Allergies  Allergen Reactions  . Iodinated Diagnostic Agents Anaphylaxis    Other reaction(s): Other (See Comments) Throat swells and extreme hives  . Latex Itching  . Phenobarbital Hives  . Tape Rash    silicones    Current Medications: Current Outpatient Medications  Medication Sig Dispense Refill  . albuterol (ACCUNEB) 1.25 MG/3ML nebulizer solution Take 3 mLs (1.25 mg total) by nebulization every 6 (six) hours as needed. wheezing 75 mL 3  . apixaban (ELIQUIS) 5 MG TABS tablet Take 1 tablet (5 mg total) by mouth 2 (two) times daily. 60 tablet 0  . aspirin 81 MG tablet Take 81 mg by mouth daily.    Marland Kitchen atorvastatin (LIPITOR) 40 MG tablet Take 40 mg by mouth daily.    . B Complex Vitamins (VITAMIN B COMPLEX PO) Take 1 tablet by mouth daily.    . budesonide (ENTOCORT EC) 3 MG 24 hr capsule Take 3 capsules (9 mg total) by mouth daily. 90 capsule 2  . diphenhydrAMINE (BENADRYL) 25 mg capsule Take 25 mg by mouth daily.    . fluticasone (FLONASE) 50 MCG/ACT nasal  spray Place 2 sprays into both nostrils daily. 16 g 2  . furosemide (LASIX) 20 MG tablet Take 1 tablet (20 mg total) by mouth 2 (two) times daily. 60 tablet 0  . hydroxyurea (HYDREA) 500 MG capsule Take 1 pill twice a day on Mondays only and 1 pill a day by mouth on Tuesdays-Sundays. May take with food to minimize GI side effects. 100 capsule 1  . levETIRAcetam (KEPPRA) 500 MG tablet Take 500 mg by mouth 2 (two) times daily.    . mesalamine (LIALDA) 1.2 g EC tablet Take 2 tablets (2.4 g total) by mouth 2 (two) times daily. 120 tablet 1  . metFORMIN (GLUCOPHAGE) 500 MG tablet Take 500 mg by mouth 2 (two) times daily with a meal.    . montelukast (SINGULAIR) 10 MG tablet TAKE 1 TABLET BY MOUTH EVERY DAY (Patient taking differently: Take 10 mg by mouth daily. ) 90 tablet 0  . Multiple Vitamin (MULTI-VITAMINS) TABS Take  1 tablet by mouth daily.    Marland Kitchen NIFEdipine (NIFEDICAL XL) 30 MG 24 hr tablet Take 30 mg by mouth daily.    . OXYGEN Inhale 2 L into the lungs.    . sitaGLIPtin (JANUVIA) 25 MG tablet Take 1 tablet by mouth daily.    Marland Kitchen umeclidinium-vilanterol (ANORO ELLIPTA) 62.5-25 MCG/INH AEPB Inhale 1 puff into the lungs daily. 180 each 3  . valsartan-hydrochlorothiazide (DIOVAN-HCT) 320-25 MG tablet Take 1 tablet by mouth daily.  0  . VENTOLIN HFA 108 (90 Base) MCG/ACT inhaler INHALE 2 PUFFS INTO THE LUNGS EVERY 6 HOURS AS NEEDED (Patient taking differently: Inhale 2 puffs into the lungs every 6 (six) hours as needed for wheezing or shortness of breath. ) 54 g 1  . vitamin C (ASCORBIC ACID) 500 MG tablet Take 500 mg by mouth 2 (two) times daily.     No current facility-administered medications for this visit.    Review of Systems  Constitutional: Positive for weight loss (4 lbs). Negative for chills, diaphoresis, fever and malaise/fatigue.       Doing "ok". Using portable oxygen.  HENT: Negative.  Negative for congestion, ear discharge, ear pain, hearing loss, nosebleeds, sinus pain, sore throat  and tinnitus.   Eyes: Negative.  Negative for blurred vision.       Macular degeneration left eye, receives injection.   Respiratory: Positive for shortness of breath (on exertion). Negative for cough, hemoptysis and sputum production.        Sleep apnea - uses CPAP. COPD on 2 liters/min of oxygen.  Cardiovascular: Negative for chest pain, palpitations and leg swelling.  Gastrointestinal: Positive for diarrhea (on and off, on two meds). Negative for abdominal pain, blood in stool, constipation, heartburn, melena, nausea and vomiting.  Genitourinary: Negative.  Negative for dysuria, frequency, hematuria and urgency.  Musculoskeletal: Negative for back pain (DDD), falls, joint pain (OA in shoulders), myalgias and neck pain.       Right foot soreness.  Skin: Negative for itching and rash.  Neurological: Positive for dizziness (lightheadedness, improved). Negative for tremors, sensory change, speech change, weakness and headaches.  Endo/Heme/Allergies: Does not bruise/bleed easily.  Psychiatric/Behavioral: Negative for depression and memory loss. The patient has insomnia. The patient is not nervous/anxious.   All other systems reviewed and are negative.  Performance status (ECOG): 1-2  Vitals Blood pressure (!) 128/56, pulse 66, temperature (!) 97.1 F (36.2 C), temperature source Tympanic, weight 232 lb 2.3 oz (105.3 kg), SpO2 95 %.  Physical Exam Vitals and nursing note reviewed.  Constitutional:      General: She is not in acute distress.    Appearance: She is well-developed. She is not diaphoretic.  HENT:     Head: Normocephalic and atraumatic.     Comments: Short gray hair.    Mouth/Throat:     Mouth: Mucous membranes are moist.     Pharynx: Oropharynx is clear.  Eyes:     General: No scleral icterus.    Extraocular Movements: Extraocular movements intact.     Conjunctiva/sclera: Conjunctivae normal.     Pupils: Pupils are equal, round, and reactive to light.     Comments:  Glasses.  Brown eyes.  Cardiovascular:     Rate and Rhythm: Normal rate and regular rhythm.     Heart sounds: Normal heart sounds. No murmur heard.   Pulmonary:     Effort: Pulmonary effort is normal. No respiratory distress.     Comments: Fine crackles on deep inspiration likely due to  atelectasis. Chest:     Chest wall: No tenderness.  Abdominal:     General: Bowel sounds are normal. There is no distension.     Palpations: Abdomen is soft. There is no hepatomegaly, splenomegaly or mass.     Tenderness: There is no abdominal tenderness. There is no guarding or rebound.  Musculoskeletal:        General: Tenderness (right foot) present. No swelling. Normal range of motion.     Cervical back: Normal range of motion and neck supple.     Right lower leg: Edema (right foot) present.  Lymphadenopathy:     Head:     Right side of head: No preauricular, posterior auricular or occipital adenopathy.     Left side of head: No preauricular, posterior auricular or occipital adenopathy.     Cervical: No cervical adenopathy.     Upper Body:     Right upper body: No supraclavicular or axillary adenopathy.     Left upper body: No supraclavicular or axillary adenopathy.     Lower Body: No right inguinal adenopathy. No left inguinal adenopathy.  Skin:    General: Skin is warm and dry.  Neurological:     Mental Status: She is alert and oriented to person, place, and time.  Psychiatric:        Behavior: Behavior normal.        Thought Content: Thought content normal.        Judgment: Judgment normal.     Appointment on 07/08/2020  Component Date Value Ref Range Status  . WBC 07/08/2020 11.0* 4.0 - 10.5 K/uL Final  . RBC 07/08/2020 3.34* 3.87 - 5.11 MIL/uL Final  . Hemoglobin 07/08/2020 11.5* 12.0 - 15.0 g/dL Final  . HCT 07/08/2020 34.7* 36 - 46 % Final  . MCV 07/08/2020 103.9* 80.0 - 100.0 fL Final  . MCH 07/08/2020 34.4* 26.0 - 34.0 pg Final  . MCHC 07/08/2020 33.1  30.0 - 36.0 g/dL Final    . RDW 07/08/2020 14.4  11.5 - 15.5 % Final  . Platelets 07/08/2020 289  150 - 400 K/uL Final  . nRBC 07/08/2020 0.0  0.0 - 0.2 % Final  . Neutrophils Relative % 07/08/2020 81  % Final  . Neutro Abs 07/08/2020 8.9* 1.7 - 7.7 K/uL Final  . Lymphocytes Relative 07/08/2020 5  % Final  . Lymphs Abs 07/08/2020 0.5* 0.7 - 4.0 K/uL Final  . Monocytes Relative 07/08/2020 9  % Final  . Monocytes Absolute 07/08/2020 1.0  0 - 1 K/uL Final  . Eosinophils Relative 07/08/2020 3  % Final  . Eosinophils Absolute 07/08/2020 0.3  0 - 0 K/uL Final  . Basophils Relative 07/08/2020 1  % Final  . Basophils Absolute 07/08/2020 0.1  0 - 0 K/uL Final  . Immature Granulocytes 07/08/2020 1  % Final  . Abs Immature Granulocytes 07/08/2020 0.08* 0.00 - 0.07 K/uL Final   Performed at [facility], [address], [place], [place] [address]    Assessment:  Angelica Juarez is a 79 y.o. female withpolycythemia rubra vera.She has had sleep apnea x 7 years and uses CPAP. She has a 40 pack year smoking history (stopped 13 years ago). She denies and cardiac history.  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 There was no significant dysplasia or increased blasts. Overall features were c/w polycythemia rubra vera.  Work up on 08/08/2018 revealed a WBC of 16,700 (Rosa Sanchez 13,800). Hemoglobin 15.3,  hematocrit 45.5, MCV 87.9, and platelets 552,000. Erythropoietin level was normal at 2.9 mIU/mL. BCR/ABL demonstrated no BCR or ABL gene rearrangements. Carbon monoxide level was normal at 3.0% (0-3.6 %). 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   She began a phlebotomy programon 09/07/2018 (last04/30/2020). Hematocrit goalis <=42.  She began hydroxyurea500 mg a day on 09/12/2018.  She was diagnosed with C difficile + diarrheaon 10/10/2018; completed 10 day course of oral vancomycin. 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  Colonoscopy on 06/01/2020 revealed erythematous mucosa in the descending, transverse, and ascending colon and diverticulosis in the sigmoid colon. There were non-bleeding external hemorrhoids. Four polyps were removed (tubular adenomas). Pathology revealed diffuse microscopic colitis.  The patient was admitted to [facility] from 06/11/2020 - 06/13/2020 for COPD exacerbation and acute on chronic heart failure. Echocardiogram revealed an EF of 50-55%. She received Lasix.  She received her first COVID-19 vaccine on 02/28/2020 and last COVID-19 vaccine 2 weeks ago  Symptomatically, she has felt "ok". She notes shortness of breath on exertion. She developed some new right foot soreness without trauma. Exam is unremarkable.  Plan: 1.   Review CBC from 06/11/2020 and 07/08/2020. 2.Polycythemia rubra vera Hematocrit34.7. Hemoglobin11.5.Platelets289,000. Hematocrit goal <= 42. Platelet goal< 400,000. Continue hydroxyurea 500 mg a day. Continue to monitor every 3 months. 3.   Macrocytic anemia  Hemoglobin 11.5 with MCV 103.9.  Patient denies any bleeding.  Ferritin 107 with an iron saturation of 19% and a TIBC of 316 on 06/11/2020.  B12 420 and folate 34.0 on 06/11/2020.  TSH 3.906 on 06/11/2020.  Etiology likely secondary to hydroxyurea. 4.   RTC in 3 months for MD assessment and labs (CBC with diff, CMP).  I discussed the assessment and treatment plan with the patient.  The patient was provided an opportunity to ask questions and all were answered.  The patient agreed with the plan and demonstrated an understanding of the instructions.  The patient was advised to call back if the symptoms worsen or if the condition fails to improve as anticipated.  I provided 11 minutes of face-to-face time during this this encounter and > 50% was spent counseling as documented under my assessment and plan.  An  additional 5 minutes were spent reviewing her chart (Epic and Care Everywhere) including notes, labs, and imaging studies.    Lequita Asal, MD, PhD    07/09/2020, 10:33 AM  I, Mirian Mo Tufford, am acting as Education administrator for [employer]. Mike Gip, MD, PhD.  I, Caitlyne Ingham C. Mike Gip, MD, have reviewed the above documentation for accuracy and completeness, and I agree with the above.

## 2020-07-09 ENCOUNTER — Other Ambulatory Visit: Payer: Medicare Other

## 2020-07-09 ENCOUNTER — Inpatient Hospital Stay (HOSPITAL_BASED_OUTPATIENT_CLINIC_OR_DEPARTMENT_OTHER): Payer: Medicare Other | Admitting: Hematology and Oncology

## 2020-07-09 ENCOUNTER — Encounter: Payer: Self-pay | Admitting: Hematology and Oncology

## 2020-07-09 VITALS — BP 128/56 | HR 66 | Temp 97.1°F | Wt 232.1 lb

## 2020-07-09 DIAGNOSIS — D45 Polycythemia vera: Secondary | ICD-10-CM

## 2020-07-09 DIAGNOSIS — D539 Nutritional anemia, unspecified: Secondary | ICD-10-CM | POA: Diagnosis not present

## 2020-07-09 NOTE — Progress Notes (Signed)
Patient c/o right foot pain (5) started on 07/08/2020

## 2020-07-13 ENCOUNTER — Ambulatory Visit: Payer: Medicare Other | Admitting: Hematology and Oncology

## 2020-07-13 ENCOUNTER — Other Ambulatory Visit: Payer: Medicare Other

## 2020-07-28 ENCOUNTER — Other Ambulatory Visit: Payer: Self-pay

## 2020-07-30 ENCOUNTER — Ambulatory Visit (INDEPENDENT_AMBULATORY_CARE_PROVIDER_SITE_OTHER): Payer: Medicare Other | Admitting: Gastroenterology

## 2020-07-30 DIAGNOSIS — K529 Noninfective gastroenteritis and colitis, unspecified: Secondary | ICD-10-CM

## 2020-08-25 ENCOUNTER — Telehealth: Payer: Self-pay | Admitting: Gastroenterology

## 2020-08-25 ENCOUNTER — Telehealth: Payer: Self-pay

## 2020-08-25 NOTE — Telephone Encounter (Signed)
Returned patient call. Patient would like to reschedule her appt. Message sent to front desk to have appt rescheduled.

## 2020-08-25 NOTE — Telephone Encounter (Signed)
Returned patient's call to reschedule.. Patient stated that she would call our office back when she wakes up.per orders patient.

## 2020-09-14 ENCOUNTER — Encounter: Payer: Self-pay | Admitting: Gastroenterology

## 2020-09-14 ENCOUNTER — Other Ambulatory Visit: Payer: Self-pay

## 2020-09-14 ENCOUNTER — Ambulatory Visit (INDEPENDENT_AMBULATORY_CARE_PROVIDER_SITE_OTHER): Payer: Medicare Other | Admitting: Gastroenterology

## 2020-09-14 VITALS — BP 167/91 | HR 130 | Temp 97.3°F | Ht 67.0 in

## 2020-09-14 DIAGNOSIS — R198 Other specified symptoms and signs involving the digestive system and abdomen: Secondary | ICD-10-CM

## 2020-09-14 DIAGNOSIS — I5032 Chronic diastolic (congestive) heart failure: Secondary | ICD-10-CM | POA: Diagnosis not present

## 2020-09-14 DIAGNOSIS — K52832 Lymphocytic colitis: Secondary | ICD-10-CM

## 2020-09-14 MED ORDER — FUROSEMIDE 40 MG PO TABS
40.0000 mg | ORAL_TABLET | Freq: Every day | ORAL | 0 refills | Status: AC
Start: 2020-09-14 — End: ?

## 2020-09-14 NOTE — Progress Notes (Signed)
Cephas Darby, MD [address]  [place]  [place],  [address]  Main: [phone]  Fax: [phone]    Gastroenterology Consultation  Referring Provider:     Sofie Hartigan, MD Primary Care Physician:  Sofie Hartigan, MD Primary Gastroenterologist:  Dr. Cephas Darby Reason for Consultation:     Chronic diarrhea        HPI:   Angelica Juarez is a 79 y.o. female referred by Dr. Sofie Hartigan, MD  for consultation & management of chronic diarrhea.  Patient has history of COPD on 2 L home oxygen, A. fib on Eliquis, metabolic syndrome.  Patient reports that she has been experiencing approximately 5 months history of nonbloody diarrhea, describes bowel movements as loose, 5-6 times daily.  She denies abdominal bloating, abdominal cramps.  Her weight has been stable.  She denies nocturnal diarrhea.  She underwent stool studies which were negative for infection.  She had history of C. difficile in 2020.  She does acknowledge drinking diet soda daily.  She does not follow low-sodium diet as well.  Patient has tried Imodium once, did not help Labs revealed mild leukocytosis  Patient is accompanied by her husband today She does not smoke or drink alcohol  Follow-up visit 09/14/2020 Patient is here for follow-up of diarrhea.  Patient underwent upper endoscopy as well as colonoscopy which confirmed microscopic colitis.  She has several colon polyps that were adenomatous without high-grade dysplasia.  I started her on budesonide 9 mg daily for 3 months, she finished the course and her diarrhea resolved.  I also started her on mesalamine prior to colonoscopy given her elevated fecal calprotectin levels.  She is no longer on mesalamine as well.  Currently, she reports that she has been having irregular bowel habits, also confirmed by her husband.  Her last bowel movement was 2 days ago.  She is concerned about abdominal bloating/distention, unable to eat, worsening shortness of  breath, worsening swelling of legs, worsening fatigue.  She does admit to eating chips, hamburger yesterday. She was hospitalized in July after upper endoscopy as well as colonoscopy secondary to acute on chronic CHF, secondary to dietary noncompliance.  Patient's said she also ran out of Lasix, 2 days ago.  She has appointment to see Dr. Ubaldo Glassing day after tomorrow  NSAIDs: None  Antiplts/Anticoagulants/Anti thrombotics: Eliquis for history of A. fib  GI Procedures:colonoscopy more than 10 years ago EGD and colonoscopy 06/01/2020 DIAGNOSIS:  A. DUODENUM, SUBMUCOSAL LESION; COLD BIOPSY:  - DUODENAL MUCOSA WITH INTACT VILLI.  - SCANT SUBMUCOSAL ADIPOSE TISSUE COMPATIBLE WITH LIPOMA, BUT NOT  SPECIFIC FOR THE DIAGNOSIS.  - NEGATIVE FOR ACTIVE INFLAMMATION, INTRAEPITHELIAL LYMPHOCYTOSIS, AND  INFECTIOUS AGENTS.   B. DUODENUM; COLD BIOPSY:  - DUODENAL MUCOSA WITH INTACT VILLI.  - NEGATIVE FOR ACTIVE INFLAMMATION, INTRAEPITHELIAL LYMPHOCYTOSIS, AND  INFECTIOUS AGENTS.   C. GASTROESOPHAGEAL JUNCTION; COLD BIOPSY:  - COLUMNAR-LINED MUCOSA WITH FOCAL INTESTINAL METAPLASIA AND MODERATE  CHRONIC INFLAMMATION.  - STRATIFIED SQUAMOUS EPITHELIUM WITH A VERY RARE EOSINOPHIL, FEWER THAN  1 PER HIGH-POWER FIELD, AND ABSENT REACTIVE EPITHELIAL CHANGES.  - CLINICAL CORRELATION IS NEEDED TO DETERMINE THE SIGNIFICANCE OF  INTESTINAL METAPLASIA AT THIS SITE.  - NEGATIVE FOR DYSPLASIA AND MALIGNANCY.   D. COLON POLYP X 2, ASCENDING; HOT SNARE AND COLD BIOPSY:  - TUBULAR ADENOMAS, MULTIPLE FRAGMENTS.  - NEGATIVE FOR HIGH-GRADE DYSPLASIA AND MALIGNANCY.   E. RIGHT COLON; RANDOM COLD BIOPSY:  - MICROSCOPIC COLITIS, SEE COMMENT.  -  NEGATIVE FOR DYSPLASIA AND MALIGNANCY.   F. COLON POLYP, TRANSVERSE; COLD SNARE:  - TUBULAR ADENOMA, MULTIPLE FRAGMENTS.  - NEGATIVE FOR HIGH-GRADE DYSPLASIA AND MALIGNANCY.   G. COLON POLYP, TRANSVERSE; HOT SNARE:  - TUBULAR ADENOMA, MULTIPLE FRAGMENTS.  - NEGATIVE  FOR HIGH-GRADE DYSPLASIA AND MALIGNANCY.   H. LEFT COLON; COLD BIOPSY:  - MICROSCOPIC COLITIS,SEE COMMENT.  - NEGATIVE FOR DYSPLASIA AND MALIGNANCY.   Past Medical History:  Diagnosis Date  . Allergy    Seasonal  . Arthritis   . 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 Chronic kidney disease    chronic renal insufficiency  . Colon polyps 05/21/14  . COPD (chronic obstructive pulmonary disease) ([place])   . Diabetes mellitus without complication ([place])   . Dyspnea   . Fracture closed, humerus, shaft    right   . History of kidney stones    40 years ago  . Hyperlipidemia 04/30/14  . Hypertension   . Impingement syndrome of right shoulder 12/29/15  . Personal history of radiation therapy   . Pneumonia    hx  . Rotator cuff tear 04/21/16   right  . Seizures ([place])   . Sleep apnea     Past Surgical History:  Procedure Laterality Date  . ABDOMINAL HYSTERECTOMY    . COLONOSCOPY WITH PROPOFOL N/A 06/01/2020   Procedure: COLONOSCOPY WITH PROPOFOL;  Surgeon: Lin Landsman, MD;  Location[facility] ENDOSCOPY;  Service: Gastroenterology;  Laterality: N/A;  . ESOPHAGOGASTRODUODENOSCOPY (EGD) WITH PROPOFOL N/A 06/01/2020   Procedure: ESOPHAGOGASTRODUODENOSCOPY (EGD) WITH PROPOFOL;  Surgeon: Lin Landsman, MD;  Location: [facility] ENDOSCOPY;  Service: Gastroenterology;  Laterality: N/A;  . 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 EYE SURGERY     bilateral cataracts  . JOINT REPLACEMENT     bilateral knee replacement  . NASAL SEPTUM SURGERY    . OOPHORECTOMY    . REPLACEMENT TOTAL KNEE Bilateral 2004  . SHOULDER ARTHROSCOPY WITH BICEPSTENOTOMY Right 05/25/2016   Procedure: SHOULDER ARTHROSCOPY WITH BICEPSTENOTOMY;  Surgeon: Leanor Kail, MD;  Location: [facility] ORS;  Service: Orthopedics;  Laterality: Right;  . SHOULDER ARTHROSCOPY WITH DISTAL CLAVICLE RESECTION Right 05/25/2016   Procedure: SHOULDER ARTHROSCOPY WITH DISTAL CLAVICLE RESECTION;  Surgeon: Leanor Kail,  MD;  Location: [facility] ORS;  Service: Orthopedics;  Laterality: Right;  . SHOULDER ARTHROSCOPY WITH OPEN ROTATOR CUFF REPAIR Right 05/25/2016   Procedure: SHOULDER ARTHROSCOPY WITH OPEN ROTATOR CUFF REPAIR;  Surgeon: Leanor Kail, MD;  Location: [facility] ORS;  Service: Orthopedics;  Laterality: Right;  . SUBACROMIAL DECOMPRESSION Right 05/25/2016   Procedure: SUBACROMIAL DECOMPRESSION;  Surgeon: Leanor Kail, MD;  Location: [facility] ORS;  Service: Orthopedics;  Laterality: Right;    Current Outpatient Medications:  .  albuterol (ACCUNEB) 1.25 MG/3ML nebulizer solution, Take 3 mLs (1.25 mg total) by nebulization every 6 (six) hours as needed. wheezing, Disp: 75 mL, Rfl: 3 .  apixaban (ELIQUIS) 5 MG TABS tablet, Take 1 tablet (5 mg total) by mouth 2 (two) times daily., Disp: 60 tablet, Rfl: 0 .  aspirin 81 MG tablet, Take 81 mg by mouth daily., Disp: , Rfl:  .  atorvastatin (LIPITOR) 40 MG tablet, Take 40 mg by mouth daily., Disp: , Rfl:  .  B Complex Vitamins (VITAMIN B COMPLEX PO), Take 1 tablet by mouth daily., Disp: , Rfl:  .  budesonide (ENTOCORT EC) 3 MG 24 hr capsule, Take 3 capsules (9 mg total) by mouth daily., Disp: 90 capsule, Rfl: 2 .  diphenhydrAMINE (BENADRYL) 25 mg capsule, Take 25 mg by mouth daily., Disp: , Rfl:  .  fluticasone (FLONASE) 50 MCG/ACT nasal spray, Place 2 sprays into both nostrils daily., Disp: 16 g, Rfl: 2 .  furosemide (LASIX) 20 MG tablet, Take 1 tablet (20 mg total) by mouth 2 (two) times daily., Disp: 60 tablet, Rfl: 0 .  hydroxyurea (HYDREA) 500 MG capsule, Take 1 pill twice a day on Mondays only and 1 pill a day by mouth on Tuesdays-Sundays. May take with food to minimize GI side effects., Disp: 100 capsule, Rfl: 1 .  lamoTRIgine (LAMICTAL) 25 MG tablet, Wk1:1tab night wk2:1tab 2xday wk3:1tab AM 2tabsPM wk4:2tabs 2xday wk5:2tabs AM 3tabs PM wk6:3tabs 2xday wk7:3tabs AM 4tabs PM, wk8:4tab2xday, Disp: , Rfl:  .  levETIRAcetam (KEPPRA) 500 MG tablet, Take 500 mg by mouth  2 (two) times daily., Disp: , Rfl:  .  metFORMIN (GLUCOPHAGE) 500 MG tablet, Take 500 mg by mouth 2 (two) times daily with a meal., Disp: , Rfl:  .  montelukast (SINGULAIR) 10 MG tablet, TAKE 1 TABLET BY MOUTH EVERY DAY (Patient taking differently: Take 10 mg by mouth daily. ), Disp: 90 tablet, Rfl: 0 .  Multiple Vitamin (MULTI-VITAMINS) TABS, Take 1 tablet by mouth daily., Disp: , Rfl:  .  NIFEdipine (NIFEDICAL XL) 30 MG 24 hr tablet, Take 30 mg by mouth daily., Disp: , Rfl:  .  OXYGEN, Inhale 2 L into the lungs., Disp: , Rfl:  .  potassium chloride SA (KLOR-CON) 20 MEQ tablet, Take 20 mEq by mouth daily., Disp: , Rfl:  .  sitaGLIPtin (JANUVIA) 25 MG tablet, Take 1 tablet by mouth daily., Disp: , Rfl:  .  traZODone (DESYREL) 100 MG tablet, Take by mouth., Disp: , Rfl:  .  umeclidinium-vilanterol (ANORO ELLIPTA) 62.5-25 MCG/INH AEPB, Inhale 1 puff into the lungs daily., Disp: 180 each, Rfl: 3 .  valsartan-hydrochlorothiazide (DIOVAN-HCT) 320-25 MG tablet, Take 1 tablet by mouth daily., Disp: , Rfl: 0 .  VENTOLIN HFA 108 (90 Base) MCG/ACT inhaler, INHALE 2 PUFFS INTO THE LUNGS EVERY 6 HOURS AS NEEDED (Patient taking differently: Inhale 2 puffs into the lungs every 6 (six) hours as needed for wheezing or shortness of breath. ), Disp: 54 g, Rfl: 1 .  vitamin C (ASCORBIC ACID) 500 MG tablet, Take 500 mg by mouth 2 (two) times daily., Disp: , Rfl:  .  furosemide (LASIX) 40 MG tablet, Take 1 tablet (40 mg total) by mouth daily., Disp: 7 tablet, Rfl: 0 .  mesalamine (LIALDA) 1.2 g EC tablet, Take 2 tablets (2.4 g total) by mouth 2 (two) times daily., Disp: 120 tablet, Rfl: 1    Family History  Problem Relation Age of Onset  . Breast cancer Paternal Aunt 65  . Diabetes Father   . Heart disease Father   . Breast cancer Daughter 65     Social History   Tobacco Use  . Smoking status: Former Smoker    Packs/day: 1.00    Years: 40.00    Pack years: 40.00    Quit date: 05/12/2005    Years since  quitting: 15.3  . Smokeless tobacco: Never Used  Vaping Use  . Vaping Use: Never used  Substance Use Topics  . Alcohol use: No  . Drug use: No    Allergies as of 09/14/2020 - Review Complete 09/14/2020  Allergen Reaction Noted  . Iodinated diagnostic agents Anaphylaxis 10/02/2012  . Latex Itching 05/13/2016  . Phenobarbital Hives 05/12/2016  . Tape Rash 05/12/2016  Review of Systems:    All systems reviewed and negative except where noted in HPI.   Physical Exam:  BP (!) 167/91 (BP Location: Left Arm, Patient Position: Sitting, Cuff Size: Large)   Pulse (!) 130   Temp (!) 97.3 F (36.3 C) (Oral)   Ht 5\' 7"  (1.702 m)   BMI 36.36 kg/m  No LMP recorded. Patient is postmenopausal.  General:   Alert, mild distress secondary to shortness of breath Head:  Normocephalic and atraumatic. Eyes:  Sclera clear, no icterus.   Conjunctiva pink. Ears:  Normal auditory acuity. Nose:  No deformity, discharge, or lesions. Mouth:  No deformity or lesions,oropharynx pink & moist. Neck:  Supple; no masses or thyromegaly. Lungs:  Respirations even and unlabored.  Clear throughout to auscultation.   No wheezes, crackles, or rhonchi. No acute distress. Heart: Irregular rhythm, tachycardia; no murmurs, clicks, rubs, or gallops. Abdomen:  Normal bowel sounds. Soft, non-tender and distended without masses, hepatosplenomegaly or hernias noted.  No guarding or rebound tenderness.   Rectal: Not performed Msk:  Symmetrical without gross deformities. Good, equal movement & strength bilaterally. Pulses:  Normal pulses noted. Extremities:  No clubbing, 3+edema.  No cyanosis. Neurologic:  Alert and oriented x3;  grossly normal neurologically. Skin:  Intact without significant lesions or rashes. No jaundice. Psych:  Alert and cooperative. Normal mood and affect.  Imaging Studies: Reviewed  Assessment and Plan:   [name] is a 79 y.o. female with extobacco use, COPD on home oxygen, metabolic  syndrome, A. fib on Eliquis, diastolic heart failure is seen in consultation for chronic diarrhea, confirmed diagnosis of microscopic colitis.   Chronic nonbloody diarrhea: Currently resolved Stool studies are negative for infection Pancreatic fecal elastase levels were normal.  Mildly elevated fecal calprotectin levels EGD and colonoscopy with gastric, duodenal biopsies, TI evaluation and random colon biopsies confirmed microscopic colitis.  No evidence of celiac disease Patient is treated with budesonide 9 mg daily for 3 months for microscopic colitis as well as mesalamine 2.4 g daily for elevated fecal calprotectin levels Do not recommend to continue further treatment at this time as diarrhea has resolved  Irregular bowel habits Recommend MiraLAX 1-2 times daily  Volume overload/anasarca Combination of dietary noncompliance and medication noncompliance Refill Lasix 40 mg for 1 week only Reiterated regarding strict low-sodium diet Follow-up with Dr. Ubaldo Glassing day after tomorrow  Follow up if diarrhea recurs   Cephas Darby, MD

## 2020-09-28 ENCOUNTER — Emergency Department: Payer: Medicare Other

## 2020-09-28 ENCOUNTER — Other Ambulatory Visit: Payer: Self-pay

## 2020-09-28 ENCOUNTER — Inpatient Hospital Stay
Admission: EM | Admit: 2020-09-28 | Discharge: 2020-10-06 | DRG: 602 | Disposition: A | Discharge status: Skilled Nursing Facility | Payer: Medicare Other | Attending: Internal Medicine | Admitting: Internal Medicine

## 2020-09-28 DIAGNOSIS — I959 Hypotension, unspecified: Secondary | ICD-10-CM | POA: Diagnosis present

## 2020-09-28 DIAGNOSIS — Z8544 Personal history of malignant neoplasm of other female genital organs: Secondary | ICD-10-CM

## 2020-09-28 DIAGNOSIS — L129 Pemphigoid, unspecified: Secondary | ICD-10-CM | POA: Diagnosis present

## 2020-09-28 DIAGNOSIS — Z7901 Long term (current) use of anticoagulants: Secondary | ICD-10-CM

## 2020-09-28 DIAGNOSIS — N1831 Chronic kidney disease, stage 3a: Secondary | ICD-10-CM | POA: Diagnosis present

## 2020-09-28 DIAGNOSIS — Z87891 Personal history of nicotine dependence: Secondary | ICD-10-CM

## 2020-09-28 DIAGNOSIS — E1129 Type 2 diabetes mellitus with other diabetic kidney complication: Secondary | ICD-10-CM | POA: Diagnosis present

## 2020-09-28 DIAGNOSIS — E1122 Type 2 diabetes mellitus with diabetic chronic kidney disease: Secondary | ICD-10-CM | POA: Diagnosis present

## 2020-09-28 DIAGNOSIS — R21 Rash and other nonspecific skin eruption: Secondary | ICD-10-CM | POA: Diagnosis present

## 2020-09-28 DIAGNOSIS — L03116 Cellulitis of left lower limb: Secondary | ICD-10-CM | POA: Diagnosis not present

## 2020-09-28 DIAGNOSIS — Z7982 Long term (current) use of aspirin: Secondary | ICD-10-CM

## 2020-09-28 DIAGNOSIS — D45 Polycythemia vera: Secondary | ICD-10-CM | POA: Diagnosis present

## 2020-09-28 DIAGNOSIS — G40909 Epilepsy, unspecified, not intractable, without status epilepticus: Secondary | ICD-10-CM | POA: Diagnosis present

## 2020-09-28 DIAGNOSIS — J9611 Chronic respiratory failure with hypoxia: Secondary | ICD-10-CM | POA: Diagnosis present

## 2020-09-28 DIAGNOSIS — Z91040 Latex allergy status: Secondary | ICD-10-CM

## 2020-09-28 DIAGNOSIS — Z23 Encounter for immunization: Secondary | ICD-10-CM

## 2020-09-28 DIAGNOSIS — I509 Heart failure, unspecified: Secondary | ICD-10-CM

## 2020-09-28 DIAGNOSIS — D75839 Thrombocytosis, unspecified: Secondary | ICD-10-CM | POA: Diagnosis present

## 2020-09-28 DIAGNOSIS — J449 Chronic obstructive pulmonary disease, unspecified: Secondary | ICD-10-CM | POA: Diagnosis present

## 2020-09-28 DIAGNOSIS — K52839 Microscopic colitis, unspecified: Secondary | ICD-10-CM | POA: Diagnosis present

## 2020-09-28 DIAGNOSIS — Z888 Allergy status to other drugs, medicaments and biological substances status: Secondary | ICD-10-CM

## 2020-09-28 DIAGNOSIS — Z91041 Radiographic dye allergy status: Secondary | ICD-10-CM

## 2020-09-28 DIAGNOSIS — I5033 Acute on chronic diastolic (congestive) heart failure: Secondary | ICD-10-CM | POA: Diagnosis present

## 2020-09-28 DIAGNOSIS — I480 Paroxysmal atrial fibrillation: Secondary | ICD-10-CM | POA: Diagnosis present

## 2020-09-28 DIAGNOSIS — Z7984 Long term (current) use of oral hypoglycemic drugs: Secondary | ICD-10-CM

## 2020-09-28 DIAGNOSIS — I10 Essential (primary) hypertension: Secondary | ICD-10-CM | POA: Diagnosis present

## 2020-09-28 DIAGNOSIS — Z833 Family history of diabetes mellitus: Secondary | ICD-10-CM

## 2020-09-28 DIAGNOSIS — Z8249 Family history of ischemic heart disease and other diseases of the circulatory system: Secondary | ICD-10-CM

## 2020-09-28 DIAGNOSIS — Z803 Family history of malignant neoplasm of breast: Secondary | ICD-10-CM

## 2020-09-28 DIAGNOSIS — E119 Type 2 diabetes mellitus without complications: Secondary | ICD-10-CM

## 2020-09-28 DIAGNOSIS — D6489 Other specified anemias: Secondary | ICD-10-CM | POA: Diagnosis present

## 2020-09-28 DIAGNOSIS — E785 Hyperlipidemia, unspecified: Secondary | ICD-10-CM | POA: Diagnosis present

## 2020-09-28 DIAGNOSIS — N170 Acute kidney failure with tubular necrosis: Secondary | ICD-10-CM

## 2020-09-28 DIAGNOSIS — Z96653 Presence of artificial knee joint, bilateral: Secondary | ICD-10-CM | POA: Diagnosis present

## 2020-09-28 DIAGNOSIS — Z79899 Other long term (current) drug therapy: Secondary | ICD-10-CM

## 2020-09-28 DIAGNOSIS — Z20822 Contact with and (suspected) exposure to covid-19: Secondary | ICD-10-CM | POA: Diagnosis present

## 2020-09-28 DIAGNOSIS — N179 Acute kidney failure, unspecified: Secondary | ICD-10-CM | POA: Diagnosis present

## 2020-09-28 DIAGNOSIS — D631 Anemia in chronic kidney disease: Secondary | ICD-10-CM | POA: Diagnosis present

## 2020-09-28 DIAGNOSIS — I130 Hypertensive heart and chronic kidney disease with heart failure and stage 1 through stage 4 chronic kidney disease, or unspecified chronic kidney disease: Secondary | ICD-10-CM | POA: Diagnosis present

## 2020-09-28 DIAGNOSIS — G4730 Sleep apnea, unspecified: Secondary | ICD-10-CM | POA: Diagnosis present

## 2020-09-28 DIAGNOSIS — R809 Proteinuria, unspecified: Secondary | ICD-10-CM | POA: Diagnosis present

## 2020-09-28 LAB — CBC
HCT: 36.5 % (ref 36.0–46.0)
Hemoglobin: 11.3 g/dL — ABNORMAL LOW (ref 12.0–15.0)
MCH: 28 pg (ref 26.0–34.0)
MCHC: 31 g/dL (ref 30.0–36.0)
MCV: 90.6 fL (ref 80.0–100.0)
Platelets: 571 10*3/uL — ABNORMAL HIGH (ref 150–400)
RBC: 4.03 MIL/uL (ref 3.87–5.11)
RDW: 15 % (ref 11.5–15.5)
WBC: 14.4 10*3/uL — ABNORMAL HIGH (ref 4.0–10.5)
nRBC: 0 % (ref 0.0–0.2)

## 2020-09-28 LAB — BASIC METABOLIC PANEL
Anion gap: 12 (ref 5–15)
BUN: 16 mg/dL (ref 8–23)
CO2: 28 mmol/L (ref 22–32)
Calcium: 9.8 mg/dL (ref 8.9–10.3)
Chloride: 99 mmol/L (ref 98–111)
Creatinine, Ser: 0.93 mg/dL (ref 0.44–1.00)
GFR, Estimated: >60 mL/min (ref >60)
Glucose, Bld: 135 mg/dL — ABNORMAL HIGH (ref 70–99)
Potassium: 3.6 mmol/L (ref 3.5–5.1)
Sodium: 139 mmol/L (ref 135–145)

## 2020-09-28 LAB — CBC WITH DIFFERENTIAL/PLATELET
Abs Immature Granulocytes: 0.16 10*3/uL — ABNORMAL HIGH (ref 0.00–0.07)
Basophils Absolute: 0.2 10*3/uL — ABNORMAL HIGH (ref 0.0–0.1)
Basophils Relative: 1 %
Eosinophils Absolute: 0.3 10*3/uL (ref 0.0–0.5)
Eosinophils Relative: 3 %
HCT: 36.2 % (ref 36.0–46.0)
Hemoglobin: 11.3 g/dL — ABNORMAL LOW (ref 12.0–15.0)
Immature Granulocytes: 1 %
Lymphocytes Relative: 3 %
Lymphs Abs: 0.3 10*3/uL — ABNORMAL LOW (ref 0.7–4.0)
MCH: 28.3 pg (ref 26.0–34.0)
MCHC: 31.2 g/dL (ref 30.0–36.0)
MCV: 90.5 fL (ref 80.0–100.0)
Monocytes Absolute: 1.1 10*3/uL — ABNORMAL HIGH (ref 0.1–1.0)
Monocytes Relative: 8 %
Neutro Abs: 11.6 10*3/uL — ABNORMAL HIGH (ref 1.7–7.7)
Neutrophils Relative %: 84 %
Platelets: 545 10*3/uL — ABNORMAL HIGH (ref 150–400)
RBC: 4 MIL/uL (ref 3.87–5.11)
RDW: 15 % (ref 11.5–15.5)
WBC: 13.7 10*3/uL — ABNORMAL HIGH (ref 4.0–10.5)
nRBC: 0 % (ref 0.0–0.2)

## 2020-09-28 LAB — RESPIRATORY PANEL BY RT PCR (FLU A&B, COVID)
Influenza A by PCR: NEGATIVE
Influenza B by PCR: NEGATIVE
SARS Coronavirus 2 by RT PCR: NEGATIVE

## 2020-09-28 LAB — CREATININE, SERUM
Creatinine, Ser: 0.92 mg/dL (ref 0.44–1.00)
GFR, Estimated: >60 mL/min (ref >60)

## 2020-09-28 LAB — LACTIC ACID, PLASMA
Lactic Acid, Venous: 1.2 mmol/L (ref 0.5–1.9)
Lactic Acid, Venous: 1.6 mmol/L (ref 0.5–1.9)

## 2020-09-28 LAB — TROPONIN I (HIGH SENSITIVITY)
Troponin I (High Sensitivity): 34 ng/L — ABNORMAL HIGH (ref <18)
Troponin I (High Sensitivity): 39 ng/L — ABNORMAL HIGH (ref <18)

## 2020-09-28 LAB — GLUCOSE, CAPILLARY: Glucose-Capillary: 104 mg/dL — ABNORMAL HIGH (ref 70–99)

## 2020-09-28 MED ORDER — SODIUM CHLORIDE 0.9 % IV BOLUS
500.0000 mL | Freq: Once | INTRAVENOUS | Status: AC
  Administered 2020-09-28: 500 mL via INTRAVENOUS

## 2020-09-28 MED ORDER — NIFEDIPINE ER OSMOTIC RELEASE 30 MG PO TB24
30.0000 mg | ORAL_TABLET | Freq: Every day | ORAL | Status: DC
  Administered 2020-09-29 – 2020-10-06 (×8): 30 mg via ORAL
  Filled 2020-09-28 (×10): qty 1

## 2020-09-28 MED ORDER — HYDROXYUREA 500 MG PO CAPS: 500.0000 mg | ORAL_CAPSULE | ORAL | Status: DC

## 2020-09-28 MED ORDER — MONTELUKAST SODIUM 10 MG PO TABS
10.0000 mg | ORAL_TABLET | Freq: Every day | ORAL | Status: DC
  Administered 2020-09-28 – 2020-10-05 (×8): 10 mg via ORAL
  Filled 2020-09-28 (×9): qty 1

## 2020-09-28 MED ORDER — LEVETIRACETAM 500 MG PO TABS
500.0000 mg | ORAL_TABLET | Freq: Two times a day (BID) | ORAL | Status: DC
  Administered 2020-09-28 – 2020-10-06 (×16): 500 mg via ORAL
  Filled 2020-09-28 (×16): qty 1

## 2020-09-28 MED ORDER — BUDESONIDE 3 MG PO CPEP
9.0000 mg | ORAL_CAPSULE | Freq: Every day | ORAL | Status: DC
  Administered 2020-09-29: 9 mg via ORAL
  Filled 2020-09-28: qty 3

## 2020-09-28 MED ORDER — ASPIRIN EC 81 MG PO TBEC
81.0000 mg | DELAYED_RELEASE_TABLET | Freq: Every day | ORAL | Status: DC
  Administered 2020-09-29 – 2020-10-06 (×8): 81 mg via ORAL
  Filled 2020-09-28 (×9): qty 1

## 2020-09-28 MED ORDER — VALSARTAN-HYDROCHLOROTHIAZIDE 320-25 MG PO TABS: 1.0000 | ORAL_TABLET | Freq: Every day | ORAL | Status: DC

## 2020-09-28 MED ORDER — INSULIN ASPART 100 UNIT/ML ~~LOC~~ SOLN
0.0000 [IU] | Freq: Three times a day (TID) | SUBCUTANEOUS | Status: DC
  Administered 2020-09-29 – 2020-10-01 (×5): 2 [IU] via SUBCUTANEOUS
  Administered 2020-10-02: 3 [IU] via SUBCUTANEOUS
  Administered 2020-10-05: 2 [IU] via SUBCUTANEOUS
  Filled 2020-09-28 (×7): qty 1

## 2020-09-28 MED ORDER — LAMOTRIGINE 25 MG PO TABS
25.0000 mg | ORAL_TABLET | Freq: Every day | ORAL | Status: DC
  Administered 2020-09-28 – 2020-09-29 (×2): 25 mg via ORAL
  Filled 2020-09-28 (×2): qty 1

## 2020-09-28 MED ORDER — ATORVASTATIN CALCIUM 20 MG PO TABS
40.0000 mg | ORAL_TABLET | Freq: Every day | ORAL | Status: DC
  Administered 2020-09-28 – 2020-10-05 (×8): 40 mg via ORAL
  Filled 2020-09-28 (×8): qty 2

## 2020-09-28 MED ORDER — VANCOMYCIN HCL IN DEXTROSE 1-5 GM/200ML-% IV SOLN
1000.0000 mg | Freq: Once | INTRAVENOUS | Status: AC
  Administered 2020-09-28: 1000 mg via INTRAVENOUS
  Filled 2020-09-28: qty 200

## 2020-09-28 MED ORDER — HYDROXYUREA 500 MG PO CAPS
500.0000 mg | ORAL_CAPSULE | Freq: Every day | ORAL | Status: DC
  Filled 2020-09-28: qty 1

## 2020-09-28 MED ORDER — VANCOMYCIN HCL 1500 MG/300ML IV SOLN
1500.0000 mg | INTRAVENOUS | Status: DC
  Administered 2020-09-29: 1500 mg via INTRAVENOUS
  Filled 2020-09-28 (×2): qty 300

## 2020-09-28 MED ORDER — B COMPLEX-C PO TABS
ORAL_TABLET | Freq: Every day | ORAL | Status: DC
  Administered 2020-09-29 – 2020-10-05 (×6): 1 via ORAL
  Filled 2020-09-28 (×9): qty 1

## 2020-09-28 MED ORDER — UMECLIDINIUM-VILANTEROL 62.5-25 MCG/INH IN AEPB
1.0000 | INHALATION_SPRAY | Freq: Every day | RESPIRATORY_TRACT | Status: DC
  Administered 2020-09-29 – 2020-10-05 (×7): 1 via RESPIRATORY_TRACT
  Filled 2020-09-28: qty 14

## 2020-09-28 MED ORDER — HYDROCHLOROTHIAZIDE 25 MG PO TABS
25.0000 mg | ORAL_TABLET | Freq: Every day | ORAL | Status: DC
  Administered 2020-09-28 – 2020-09-30 (×3): 25 mg via ORAL
  Filled 2020-09-28 (×3): qty 1

## 2020-09-28 MED ORDER — UMECLIDINIUM-VILANTEROL 62.5-25 MCG/INH IN AEPB: 1.0000 | INHALATION_SPRAY | Freq: Every day | RESPIRATORY_TRACT | Status: DC

## 2020-09-28 MED ORDER — HEPARIN SODIUM (PORCINE) 5000 UNIT/ML IJ SOLN: 5000.0000 [IU] | Freq: Three times a day (TID) | INTRAMUSCULAR | Status: DC

## 2020-09-28 MED ORDER — IRBESARTAN 150 MG PO TABS
300.0000 mg | ORAL_TABLET | Freq: Every day | ORAL | Status: DC
  Administered 2020-09-28 – 2020-09-30 (×3): 300 mg via ORAL
  Filled 2020-09-28 (×4): qty 2

## 2020-09-28 MED ORDER — FLUTICASONE PROPIONATE 50 MCG/ACT NA SUSP
2.0000 | Freq: Every day | NASAL | Status: DC
  Administered 2020-09-29 – 2020-10-06 (×8): 2 via NASAL
  Filled 2020-09-28: qty 16

## 2020-09-28 MED ORDER — FLUTICASONE PROPIONATE 50 MCG/ACT NA SUSP: 2.0000 | Freq: Every day | NASAL | Status: DC

## 2020-09-28 MED ORDER — FUROSEMIDE 20 MG PO TABS: 20.0000 mg | ORAL_TABLET | Freq: Two times a day (BID) | ORAL | Status: DC

## 2020-09-28 MED ORDER — ALBUTEROL SULFATE (2.5 MG/3ML) 0.083% IN NEBU
2.5000 mg | INHALATION_SOLUTION | Freq: Four times a day (QID) | RESPIRATORY_TRACT | Status: DC | PRN
  Administered 2020-10-01 – 2020-10-03 (×2): 2.5 mg via RESPIRATORY_TRACT
  Filled 2020-09-28 (×2): qty 3

## 2020-09-28 MED ORDER — MORPHINE SULFATE (PF) 4 MG/ML IV SOLN
4.0000 mg | INTRAVENOUS | Status: DC | PRN
  Administered 2020-09-29 – 2020-10-01 (×7): 4 mg via INTRAVENOUS
  Filled 2020-09-28 (×7): qty 1

## 2020-09-28 MED ORDER — ALBUTEROL SULFATE HFA 108 (90 BASE) MCG/ACT IN AERS: 2.0000 | INHALATION_SPRAY | Freq: Four times a day (QID) | RESPIRATORY_TRACT | Status: DC | PRN

## 2020-09-28 MED ORDER — SODIUM CHLORIDE 0.9 % IV SOLN
2.0000 g | Freq: Two times a day (BID) | INTRAVENOUS | Status: DC
  Administered 2020-09-29 – 2020-09-30 (×4): 2 g via INTRAVENOUS
  Filled 2020-09-28 (×6): qty 2

## 2020-09-28 MED ORDER — APIXABAN 5 MG PO TABS
5.0000 mg | ORAL_TABLET | Freq: Two times a day (BID) | ORAL | Status: DC
  Administered 2020-09-28 – 2020-10-06 (×16): 5 mg via ORAL
  Filled 2020-09-28 (×16): qty 1

## 2020-09-28 MED ORDER — ASCORBIC ACID 500 MG PO TABS
500.0000 mg | ORAL_TABLET | Freq: Two times a day (BID) | ORAL | Status: DC
  Administered 2020-09-28 – 2020-10-06 (×16): 500 mg via ORAL
  Filled 2020-09-28 (×16): qty 1

## 2020-09-28 MED ORDER — SODIUM CHLORIDE 0.9 % IV SOLN
2.0000 g | Freq: Once | INTRAVENOUS | Status: AC
  Administered 2020-09-28: 2 g via INTRAVENOUS
  Filled 2020-09-28: qty 20

## 2020-09-28 MED ORDER — INFLUENZA VAC A&B SA ADJ QUAD 0.5 ML IM PRSY
0.5000 mL | PREFILLED_SYRINGE | INTRAMUSCULAR | Status: AC
  Administered 2020-10-04: 0.5 mL via INTRAMUSCULAR
  Filled 2020-09-28: qty 0.5

## 2020-09-28 MED ORDER — MESALAMINE 1.2 G PO TBEC
2.4000 g | DELAYED_RELEASE_TABLET | Freq: Two times a day (BID) | ORAL | Status: DC
  Administered 2020-09-29: 2.4 g via ORAL
  Filled 2020-09-28 (×2): qty 2

## 2020-09-28 MED ORDER — TRAZODONE HCL 50 MG PO TABS
50.0000 mg | ORAL_TABLET | Freq: Every day | ORAL | Status: DC
  Administered 2020-09-28 – 2020-10-05 (×8): 50 mg via ORAL
  Filled 2020-09-28 (×9): qty 1

## 2020-09-28 MED ORDER — SODIUM CHLORIDE 0.9 % IV SOLN
INTRAVENOUS | Status: DC | PRN
  Administered 2020-09-29 – 2020-10-01 (×3): 250 mL via INTRAVENOUS

## 2020-09-28 NOTE — Assessment & Plan Note (Addendum)
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  Seizure precautions. Pt does not know if she is on two seizure meds or one. We will cont keprra and lamictal.

## 2020-09-28 NOTE — Assessment & Plan Note (Signed)
Home regimen with metformin and sitaglipitan and cont ssi/ glycemic protocol.

## 2020-09-28 NOTE — Consult Note (Signed)
PHARMACY -  BRIEF ANTIBIOTIC NOTE   Pharmacy has received consult(s) for cellulitis from an ED provider.  The patient's profile has been reviewed for ht/wt/allergies/indication/available labs.    One time order(s) placed for vancomycin  Further antibiotics/pharmacy consults should be ordered by admitting physician if indicated.                       Thank you, Angelica Juarez 09/28/2020  5:55 PM

## 2020-09-28 NOTE — Assessment & Plan Note (Addendum)
    Anti-infectives (From admission, onward)   Start     Dose/Rate Route Frequency Ordered Stop   09/28/20 2000  vancomycin (VANCOCIN) IVPB 1000 mg/200 mL premix        1,000 mg 200 mL/hr over 60 Minutes Intravenous  Once 09/28/20 1754     09/28/20 1730  vancomycin (VANCOCIN) IVPB 1000 mg/200 mL premix        1,000 mg 200 mL/hr over 60 Minutes Intravenous  Once 09/28/20 1726 09/28/20 1923   09/28/20 1730  cefTRIAXone (ROCEPHIN) 2 g in sodium chloride 0.9 % 100 mL IVPB        2 g 200 mL/hr over 30 Minutes Intravenous  Once 09/28/20 1726 09/28/20 1831     Media Information   Document Information  Photos    09/28/2020 19:37  Attached To:  Hospital Encounter on 09/28/20  Source Information  Para Skeans, MD  [facility]   Media Information   Document Information  Photos    09/28/2020 19:38  Attached To:  Hospital Encounter on 09/28/20  Source Information  Para Skeans, MD  [facility]   Media Information   Document Information  Photos    09/28/2020 19:38  Attached To:  Hospital Encounter on 09/28/20  Source Information  Para Skeans, MD  [facility]   -By the cellulitis order set, this would actually be considered moderate  non-purulent cellulitis and iv abx  treatment is optimal method of treatment with wound care.  -Admission in this patient is warranted due to:  -outpatient IV therapy is not appropriate because of progression of infection and clinical presentation is judged to require immediate initiation of IV antibiotics. -high-risk comorbid condition is indicated by very poorly controlled DM (A1c pending ).

## 2020-09-28 NOTE — ED Provider Notes (Signed)
[facility] Emergency Department Provider Note  ____________________________________________  Time seen: Approximately 7:17 PM  I have reviewed the triage vital signs and the nursing notes.   HISTORY  Chief Complaint Wound Infection    HPI Angelica Juarez is a 79 y.o. female with a history of CHF COPD diabetes paroxysmal atrial fibrillation who comes the ED complaining of open wounds on the left lower leg and foot with thick discharge, onset 2 days ago and worsening.  It is constant without aggravating or alleviating factors.  She has bilateral leg pain, left greater than right.  She has a history of peripheral edema which is improved with Lasix overall and better than it has been in the past.  Denies chest pain shortness of breath fevers or chills.  Reports compliance with her medications.      Past Medical History:  Diagnosis Date  . Allergy    Seasonal  . Arthritis   . 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 Chronic kidney disease    chronic renal insufficiency  . Colon polyps 05/21/14  . COPD (chronic obstructive pulmonary disease) ([place])   . Diabetes mellitus without complication ([place])   . Dyspnea   . Fracture closed, humerus, shaft    right   . History of kidney stones    40 years ago  . Hyperlipidemia 04/30/14  . Hypertension   . Impingement syndrome of right shoulder 12/29/15  . Personal history of radiation therapy   . Pneumonia    hx  . Rotator cuff tear 04/21/16   right  . Seizures ([place])   . Sleep apnea      Patient Active Problem List   Diagnosis Date Noted  . Normocytic anemia 06/11/2020  . COPD with acute exacerbation ([place]) 06/11/2020  . Chronic diarrhea of unknown origin   . Acute left flank pain 01/15/2020  . Hypokalemia 11/14/2019  . Proteinuria 08/09/2019  . Acute on chronic congestive heart failure ([place]) 07/09/2019  . Nonrheumatic mitral valve regurgitation 07/08/2019  . Other persistent atrial fibrillation ([place])  07/08/2019  . C. difficile diarrhea 11/24/2018  . Diarrhea 11/18/2018  . Nausea without vomiting 11/18/2018  . Goals of care, counseling/discussion 09/12/2018  . 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 History of repair of right rotator cuff 08/25/2016  . Acute respiratory failure with hypoxia ([place]) 08/06/2016  . Nontraumatic tear of right rotator cuff 04/21/2016  . Atypical chest pain 04/07/2016  . Impingement syndrome of right shoulder 12/29/2015  . Primary osteoarthritis of first carpometacarpal joint of left hand 10/27/2015  . Right shoulder pain 10/27/2015  . OSA on CPAP 10/14/2015  . COPD with asthma ([place]) 07/13/2015  . Type 2 diabetes mellitus with microalbuminuria, without long-term current use of insulin ([place]) 07/13/2015  . Essential hypertension 03/05/2015  . Difficulty sleeping 11/26/2014  . Chronic kidney disease, stage 3a 07/31/2014  . Seizure disorder ([place]) 07/31/2014  . History of colonic polyps 05/21/2014  . Hyperlipidemia, unspecified 04/30/2014     Past Surgical History:  Procedure Laterality Date  . ABDOMINAL HYSTERECTOMY    . COLONOSCOPY WITH PROPOFOL N/A 06/01/2020   Procedure: COLONOSCOPY WITH PROPOFOL;  Surgeon: Lin Landsman, MD;  Location: [facility] ENDOSCOPY;  Service: Gastroenterology;  Laterality: N/A;  . ESOPHAGOGASTRODUODENOSCOPY (EGD) WITH PROPOFOL N/A 06/01/2020   Procedure: ESOPHAGOGASTRODUODENOSCOPY (EGD) WITH PROPOFOL;  Surgeon: Lin Landsman, MD;  Location: [facility] ENDOSCOPY;  Service: Gastroenterology;  Laterality: N/A;  . 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 EYE SURGERY     bilateral cataracts  . JOINT REPLACEMENT     bilateral knee replacement  . NASAL SEPTUM SURGERY    . OOPHORECTOMY    . REPLACEMENT TOTAL KNEE Bilateral 2004  . SHOULDER ARTHROSCOPY WITH BICEPSTENOTOMY Right 05/25/2016   Procedure: SHOULDER ARTHROSCOPY WITH BICEPSTENOTOMY;  Surgeon: Leanor Kail, MD;  Location: [facility] ORS;  Service: Orthopedics;   Laterality: Right;  . SHOULDER ARTHROSCOPY WITH DISTAL CLAVICLE RESECTION Right 05/25/2016   Procedure: SHOULDER ARTHROSCOPY WITH DISTAL CLAVICLE RESECTION;  Surgeon: Leanor Kail, MD;  Location: [facility] ORS;  Service: Orthopedics;  Laterality: Right;  . SHOULDER ARTHROSCOPY WITH OPEN ROTATOR CUFF REPAIR Right 05/25/2016   Procedure: SHOULDER ARTHROSCOPY WITH OPEN ROTATOR CUFF REPAIR;  Surgeon: Leanor Kail, MD;  Location: [facility] ORS;  Service: Orthopedics;  Laterality: Right;  . SUBACROMIAL DECOMPRESSION Right 05/25/2016   Procedure: SUBACROMIAL DECOMPRESSION;  Surgeon: Leanor Kail, MD;  Location: [facility] ORS;  Service: Orthopedics;  Laterality: Right;     Prior to Admission medications   Medication Sig Start Date End Date Taking? Authorizing Provider  albuterol (ACCUNEB) 1.25 MG/3ML nebulizer solution Take 3 mLs (1.25 mg total) by nebulization every 6 (six) hours as needed. wheezing 05/03/19   Allyne Gee, MD  apixaban (ELIQUIS) 5 MG TABS tablet Take 1 tablet (5 mg total) by mouth 2 (two) times daily. 06/04/20   Lin Landsman, MD  aspirin 81 MG tablet Take 81 mg by mouth daily.    [provider]  atorvastatin (LIPITOR) 40 MG tablet Take 40 mg by mouth daily. 04/04/16   [provider]  B Complex Vitamins (VITAMIN B COMPLEX PO) Take 1 tablet by mouth daily.    [provider]  budesonide (ENTOCORT EC) 3 MG 24 hr capsule Take 3 capsules (9 mg total) by mouth daily. 06/04/20   Lin Landsman, MD  diphenhydrAMINE (BENADRYL) 25 mg capsule Take 25 mg by mouth daily.    [provider]  fluticasone (FLONASE) 50 MCG/ACT nasal spray Place 2 sprays into both nostrils daily. 08/08/19   Kendell Bane, NP  furosemide (LASIX) 20 MG tablet Take 1 tablet (20 mg total) by mouth 2 (two) times daily. 06/13/20   Fritzi Mandes, MD  furosemide (LASIX) 40 MG tablet Take 1 tablet (40 mg total) by mouth daily. 09/14/20   Lin Landsman, MD  hydroxyurea (HYDREA) 500 MG  capsule Take 1 pill twice a day on Mondays only and 1 pill a day by mouth on Tuesdays-Sundays. May take with food to minimize GI side effects. 03/19/20   Lequita Asal, MD  lamoTRIgine (LAMICTAL) 25 MG tablet Wk1:1tab night wk2:1tab 2xday wk3:1tab AM 2tabsPM wk4:2tabs 2xday wk5:2tabs AM 3tabs PM wk6:3tabs 2xday wk7:3tabs AM 4tabs PM, wk8:4tab2xday 08/20/20 09/17/20  [provider]  levETIRAcetam (KEPPRA) 500 MG tablet Take 500 mg by mouth 2 (two) times daily. 06/05/20   [provider]  mesalamine (LIALDA) 1.2 g EC tablet Take 2 tablets (2.4 g total) by mouth 2 (two) times daily. 05/08/20 07/09/20  Lin Landsman, MD  metFORMIN (GLUCOPHAGE) 500 MG tablet Take 500 mg by mouth 2 (two) times daily with a meal. 12/25/15   [provider]  montelukast (SINGULAIR) 10 MG tablet TAKE 1 TABLET BY MOUTH EVERY DAY Patient taking differently: Take 10 mg by mouth daily.  07/02/15   Kathrine Haddock, NP  Multiple Vitamin (MULTI-VITAMINS) TABS Take 1 tablet by mouth daily.    [provider]  NIFEdipine (NIFEDICAL  XL) 30 MG 24 hr tablet Take 30 mg by mouth daily. 09/30/15   [provider]  OXYGEN Inhale 2 L into the lungs.    [provider]  potassium chloride SA (KLOR-CON) 20 MEQ tablet Take 20 mEq by mouth daily. 08/23/20   [provider]  sitaGLIPtin (JANUVIA) 25 MG tablet Take 1 tablet by mouth daily. 01/01/18   [provider]  traZODone (DESYREL) 100 MG tablet Take by mouth. 07/22/20   [provider]  umeclidinium-vilanterol (ANORO ELLIPTA) 62.5-25 MCG/INH AEPB Inhale 1 puff into the lungs daily. 02/06/20   Kendell Bane, NP  valsartan-hydrochlorothiazide (DIOVAN-HCT) 320-25 MG tablet Take 1 tablet by mouth daily. 04/14/16   [provider]  VENTOLIN HFA 108 (90 Base) MCG/ACT inhaler INHALE 2 PUFFS INTO THE LUNGS EVERY 6 HOURS AS NEEDED Patient taking differently: Inhale 2 puffs into the lungs every 6 (six) hours as  needed for wheezing or shortness of breath.  02/18/19   Kendell Bane, NP  vitamin C (ASCORBIC ACID) 500 MG tablet Take 500 mg by mouth 2 (two) times daily.    [provider]     Allergies Iodinated diagnostic agents, Latex, Phenobarbital, and Tape   Family History  Problem Relation Age of Onset  . Breast cancer Paternal Aunt 16  . Diabetes Father   . Heart disease Father   . Breast cancer Daughter 66    Social History Social History   Tobacco Use  . Smoking status: Former Smoker    Packs/day: 1.00    Years: 40.00    Pack years: 40.00    Quit date: 05/12/2005    Years since quitting: 15.3  . Smokeless tobacco: Never Used  Vaping Use  . Vaping Use: Never used  Substance Use Topics  . Alcohol use: No  . Drug use: No    Review of Systems  Constitutional:   No fever or chills.  ENT:   No sore throat. No rhinorrhea. Cardiovascular:   No chest pain or syncope. Respiratory:   No dyspnea or cough. Gastrointestinal:   Negative for abdominal pain, vomiting and diarrhea.  Musculoskeletal:   Bilateral leg swelling and pain, left greater than right All other systems reviewed and are negative except as documented above in ROS and HPI.  ____________________________________________   PHYSICAL EXAM:  VITAL SIGNS: ED Triage Vitals [09/28/20 1522]  Enc Vitals Group     BP (!) 156/64     Pulse Rate (!) 108     Resp 20     Temp 98.9 F (37.2 C)     Temp Source Oral     SpO2 95 %     Weight 190 lb (86.2 kg)     Height 5\' 7"  (1.702 m)     Head Circumference      Peak Flow      Pain Score 8     Pain Loc      Pain Edu?      Excl. in [place]?     Vital signs reviewed, nursing assessments reviewed.   Constitutional:   Alert and oriented. Non-toxic appearance. Eyes:   Conjunctivae are normal. EOMI.  ENT      Head:   Normocephalic and atraumatic.      Nose:   Wearing a mask.      Mouth/Throat:   Wearing a mask.      Neck:   No meningismus. Full  ROM. Hematological/Lymphatic/Immunilogical:   No cervical lymphadenopathy. Cardiovascular:   Irregularly  irregular rhythm, heart rate 105. Symmetric bilateral radial and DP pulses.  No murmurs. Cap refill less than 2 seconds. Respiratory:   Normal respiratory effort without tachypnea/retractions. Breath sounds are clear and equal bilaterally. No wheezes/rales/rhonchi. Gastrointestinal:   Soft and nontender. Non distended. There is no CVA tenderness.  No rebound, rigidity, or guarding.  Musculoskeletal:   Normal range of motion in all extremities. No joint effusions.  Bilateral calf tenderness, left greater than right.  There is bilateral lower extremity edema, 2+ on the left and 1+ on the right.  Left calf circumference greater than right. Neurologic:   Normal speech and language.  Motor grossly intact. No acute focal neurologic deficits are appreciated.  Skin:    Skin is warm, dry with dry scaly skin cracked rash on the right lower leg with some hyperpigmentation, irregular shaped.  The left lower leg shows erythema extending from the distal foot up to the proximal shin, wrapping around the sides, irregular borders, with purulent exudate and honey crusting over open wounds consistent with strep infection  ____________________________________________    LABS (pertinent positives/negatives) (all labs ordered are listed, but only abnormal results are displayed) Labs Reviewed  CBC WITH DIFFERENTIAL/PLATELET - Abnormal; Notable for the following components:      Result Value   WBC 13.7 (*)    Hemoglobin 11.3 (*)    Platelets 545 (*)    Neutro Abs 11.6 (*)    Lymphs Abs 0.3 (*)    Monocytes Absolute 1.1 (*)    Basophils Absolute 0.2 (*)    Abs Immature Granulocytes 0.16 (*)    All other components within normal limits  BASIC METABOLIC PANEL - Abnormal; Notable for the following components:   Glucose, Bld 135 (*)    All other components within normal limits  TROPONIN I (HIGH SENSITIVITY) -  Abnormal; Notable for the following components:   Troponin I (High Sensitivity) 34 (*)    All other components within normal limits  TROPONIN I (HIGH SENSITIVITY) - Abnormal; Notable for the following components:   Troponin I (High Sensitivity) 39 (*)    All other components within normal limits  CULTURE, BLOOD (ROUTINE X 2)  CULTURE, BLOOD (ROUTINE X 2)  RESPIRATORY PANEL BY RT PCR (FLU A&B, COVID)  LACTIC ACID, PLASMA  LACTIC ACID, PLASMA  URINALYSIS, COMPLETE (UACMP) WITH MICROSCOPIC   ____________________________________________   EKG    ____________________________________________    RADIOLOGY  DG Chest 2 View  Result Date: 09/28/2020 CLINICAL DATA:  79 year old female with possible foot infection. EXAM: CHEST - 2 VIEW COMPARISON:  Chest radiograph dated 06/22/2020. FINDINGS: There is mild cardiomegaly with mild vascular congestion. Small left pleural effusion and left lung base atelectasis or infiltrate. Trace right pleural effusion may be present. No pneumothorax. Osteopenia with degenerative changes of the spine. No acute osseous pathology. IMPRESSION: 1. Cardiomegaly with mild vascular congestion. 2. Small left pleural effusion and left lung base atelectasis or infiltrate. Electronically Signed   By: Anner Crete M.D.   On: 09/28/2020 16:07   US Venous Img Lower Bilateral  Result Date: 09/28/2020 CLINICAL DATA:  79 year old female with bilateral lower extremity edema. EXAM: Bilateral LOWER EXTREMITY VENOUS DOPPLER ULTRASOUND TECHNIQUE: Gray-scale sonography with compression, as well as color and duplex ultrasound, were performed to evaluate the deep venous system(s) from the level of the common femoral vein through the popliteal and proximal calf veins. COMPARISON:  None. FINDINGS: VENOUS Normal compressibility of the common femoral, superficial femoral, and popliteal veins, as well as the  visualized calf veins. Visualized portions of profunda femoral vein and great  saphenous vein unremarkable. No filling defects to suggest DVT on grayscale or color Doppler imaging. Doppler waveforms show normal direction of venous flow, normal respiratory plasticity and response to augmentation. Limited views of the contralateral common femoral vein are unremarkable. OTHER None. Limitations: There is mild diffuse subcutaneous edema. No fluid collection. IMPRESSION: Negative. Electronically Signed   By: Anner Crete M.D.   On: 09/28/2020 18:40    ____________________________________________   PROCEDURES Procedures  ____________________________________________    CLINICAL IMPRESSION / ASSESSMENT AND PLAN / ED COURSE  Medications ordered in the ED: Medications  vancomycin (VANCOCIN) IVPB 1000 mg/200 mL premix (1,000 mg Intravenous New Bag/Given 09/28/20 1823)  morphine 4 MG/ML injection 4 mg (has no administration in time range)  vancomycin (VANCOCIN) IVPB 1000 mg/200 mL premix (has no administration in time range)  cefTRIAXone (ROCEPHIN) 2 g in sodium chloride 0.9 % 100 mL IVPB (0 g Intravenous Stopped 09/28/20 1831)  sodium chloride 0.9 % bolus 500 mL (500 mLs Intravenous New Bag/Given 09/28/20 1753)    Pertinent labs & imaging results that were available during my care of the patient were reviewed by me and considered in my medical decision making (see chart for details).  ONEKA PARADA was evaluated in Emergency Department on 09/28/2020 for the symptoms described in the history of present illness. She was evaluated in the context of the global COVID-19 pandemic, which necessitated consideration that the patient might be at risk for infection with the SARS-CoV-2 virus that causes COVID-19. Institutional protocols and algorithms that pertain to the evaluation of patients at risk for COVID-19 are in a state of rapid change based on information released by regulatory bodies including the CDC and federal and state organizations. These policies and algorithms were  followed during the patient's care in the ED.   Patient presents with cellulitis of the left leg.  She is not septic, afebrile with normal vital signs except for tachycardia which I think is related to her underlying paroxysmal atrial fibrillation.  Ultrasounds bilateral lower extremities negative for DVT.  With her comorbidities including age and diabetes, I recommended IV antibiotics and hospitalization until improving to which patient agrees.  Lactate is normal.  Vancomycin and ceftriaxone given.  Clinically she is not volume overloaded currently.  Covid screening ordered     ____________________________________________   FINAL CLINICAL IMPRESSION(S) / ED DIAGNOSES    Final diagnoses:  Cellulitis of left lower leg  Chronic congestive heart failure, unspecified heart failure type ([place])  Chronic obstructive pulmonary disease, unspecified COPD type ([place])  Type 2 diabetes mellitus without complication, without long-term current use of insulin (HCC)  Paroxysmal atrial fibrillation [facility])     ED Discharge Orders    None      Portions of this note were generated with dragon dictation software. Dictation errors may occur despite best attempts at proofreading.   Carrie Mew, MD 09/28/20 [id number]

## 2020-09-28 NOTE — ED Notes (Signed)
Report given to Tricia RN 

## 2020-09-28 NOTE — H&P (Signed)
History and Physical    Angelica Juarez:[id number] DOB: July 07, 1941 DOA: 09/28/2020  PCP: Sofie Hartigan, MD    Patient coming from:  home   Chief Complaint:  Left leg cellulitis.   HPI: Angelica Juarez is a 79 y.o. female with medical history significant of dm II, HTN, A.fib on Eliquis,microscopic colitis s/p budesonide therapy- GI Dr.Vanga seen in ed for left leg swelling and found to have cellulitis and venous doppler is negative. Pt reports that her legs were swollen and was given diuretic and her right leg swelling resolved but left leg swelling progressed and now is sloughing off skin with ulcerative area. The left leg startred to get progressively red and worse from last week wednesday. Pt has not seen pcp until today morning. Pt also has rash which started  As itchiness since this summer. It has been off and on and is not sure if it is flea bite or shingles.    ED Course:  Vitals:   09/28/20 1522 09/28/20 1800  BP: (!) 156/64 (!) 161/86  Pulse: (!) 108 (!) 102  Resp: 20 (!) 33  Temp: 98.9 F (37.2 C)   TempSrc: Oral   SpO2: 95% 100%  Weight: 86.2 kg   Height: 5\' 7"  (1.702 m)   In ed pt is alert and oriented and received iv abx. Venous doppler negative for dvt.    Review of Systems:  Review of Systems  Respiratory: Positive for shortness of breath.   Skin: Positive for itching and rash.       Media Information   Document Information  Photos  09/28/2020 19:39 Attached To: Hospital Encounter on 09/28/20 Source Information  Para Skeans, MD  [facility]    All other systems reviewed and are negative.    Past Medical History:  Diagnosis Date  . Allergy    Seasonal  . Arthritis   . 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 Chronic kidney disease    chronic renal insufficiency  . Colon polyps 05/21/14  . COPD (chronic obstructive pulmonary disease) ([place])   . Diabetes mellitus without complication ([place])   . Dyspnea   . Fracture  closed, humerus, shaft    right   . History of kidney stones    40 years ago  . Hyperlipidemia 04/30/14  . Hypertension   . Impingement syndrome of right shoulder 12/29/15  . Personal history of radiation therapy   . Pneumonia    hx  . Rotator cuff tear 04/21/16   right  . Seizures ([place])   . Sleep apnea     Past Surgical History:  Procedure Laterality Date  . ABDOMINAL HYSTERECTOMY    . COLONOSCOPY WITH PROPOFOL N/A 06/01/2020   Procedure: COLONOSCOPY WITH PROPOFOL;  Surgeon: Lin Landsman, MD;  Location: [facility] ENDOSCOPY;  Service: Gastroenterology;  Laterality: N/A;  . ESOPHAGOGASTRODUODENOSCOPY (EGD) WITH PROPOFOL N/A 06/01/2020   Procedure: ESOPHAGOGASTRODUODENOSCOPY (EGD) WITH PROPOFOL;  Surgeon: Lin Landsman, MD;  Location: [facility] ENDOSCOPY;  Service: Gastroenterology;  Laterality: N/A;  . 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 EYE SURGERY     bilateral cataracts  . JOINT REPLACEMENT     bilateral knee replacement  . NASAL SEPTUM SURGERY    . OOPHORECTOMY    . REPLACEMENT TOTAL KNEE Bilateral 2004  . SHOULDER ARTHROSCOPY WITH BICEPSTENOTOMY Right 05/25/2016   Procedure: SHOULDER ARTHROSCOPY WITH BICEPSTENOTOMY;  Surgeon: Leanor Kail, MD;  Location: [facility] ORS;  Service:  Orthopedics;  Laterality: Right;  . SHOULDER ARTHROSCOPY WITH DISTAL CLAVICLE RESECTION Right 05/25/2016   Procedure: SHOULDER ARTHROSCOPY WITH DISTAL CLAVICLE RESECTION;  Surgeon: Leanor Kail, MD;  Location: [facility] ORS;  Service: Orthopedics;  Laterality: Right;  . SHOULDER ARTHROSCOPY WITH OPEN ROTATOR CUFF REPAIR Right 05/25/2016   Procedure: SHOULDER ARTHROSCOPY WITH OPEN ROTATOR CUFF REPAIR;  Surgeon: Leanor Kail, MD;  Location: [facility] ORS;  Service: Orthopedics;  Laterality: Right;  . SUBACROMIAL DECOMPRESSION Right 05/25/2016   Procedure: SUBACROMIAL DECOMPRESSION;  Surgeon: Leanor Kail, MD;  Location: [facility] ORS;  Service: Orthopedics;  Laterality: Right;     reports that  she quit smoking about 15 years ago. She has a 40.00 pack-year smoking history. She has never used smokeless tobacco. She reports that she does not drink alcohol and does not use drugs.  Allergies  Allergen Reactions  . Iodinated Diagnostic Agents Anaphylaxis    Other reaction(s): Other (See Comments) Throat swells and extreme hives  . Latex Itching  . Phenobarbital Hives  . Tape Rash    silicones    Family History  Problem Relation Age of Onset  . Breast cancer Paternal Aunt 72  . Diabetes Father   . Heart disease Father   . Breast cancer Daughter 53    Prior to Admission medications   Medication Sig Start Date End Date Taking? Authorizing Provider  albuterol (ACCUNEB) 1.25 MG/3ML nebulizer solution Take 3 mLs (1.25 mg total) by nebulization every 6 (six) hours as needed. wheezing 05/03/19   Allyne Gee, MD  apixaban (ELIQUIS) 5 MG TABS tablet Take 1 tablet (5 mg total) by mouth 2 (two) times daily. 06/04/20   Lin Landsman, MD  aspirin 81 MG tablet Take 81 mg by mouth daily.    [provider]  atorvastatin (LIPITOR) 40 MG tablet Take 40 mg by mouth daily. 04/04/16   [provider]  B Complex Vitamins (VITAMIN B COMPLEX PO) Take 1 tablet by mouth daily.    [provider]  budesonide (ENTOCORT EC) 3 MG 24 hr capsule Take 3 capsules (9 mg total) by mouth daily. 06/04/20   Lin Landsman, MD  diphenhydrAMINE (BENADRYL) 25 mg capsule Take 25 mg by mouth daily.    [provider]  fluticasone (FLONASE) 50 MCG/ACT nasal spray Place 2 sprays into both nostrils daily. 08/08/19   Kendell Bane, NP  furosemide (LASIX) 20 MG tablet Take 1 tablet (20 mg total) by mouth 2 (two) times daily. 06/13/20   Fritzi Mandes, MD  furosemide (LASIX) 40 MG tablet Take 1 tablet (40 mg total) by mouth daily. 09/14/20   Lin Landsman, MD  hydroxyurea (HYDREA) 500 MG capsule Take 1 pill twice a day on Mondays only and 1 pill a day by mouth on  Tuesdays-Sundays. May take with food to minimize GI side effects. 03/19/20   Lequita Asal, MD  lamoTRIgine (LAMICTAL) 25 MG tablet Wk1:1tab night wk2:1tab 2xday wk3:1tab AM 2tabsPM wk4:2tabs 2xday wk5:2tabs AM 3tabs PM wk6:3tabs 2xday wk7:3tabs AM 4tabs PM, wk8:4tab2xday 08/20/20 09/17/20  [provider]  levETIRAcetam (KEPPRA) 500 MG tablet Take 500 mg by mouth 2 (two) times daily. 06/05/20   [provider]  mesalamine (LIALDA) 1.2 g EC tablet Take 2 tablets (2.4 g total) by mouth 2 (two) times daily. 05/08/20 07/09/20  Lin Landsman, MD  metFORMIN (GLUCOPHAGE) 500 MG tablet Take 500 mg by mouth 2 (two) times daily with a meal. 12/25/15   [provider]  montelukast (SINGULAIR)  10 MG tablet TAKE 1 TABLET BY MOUTH EVERY DAY Patient taking differently: Take 10 mg by mouth daily.  07/02/15   Kathrine Haddock, NP  Multiple Vitamin (MULTI-VITAMINS) TABS Take 1 tablet by mouth daily.    [provider]  NIFEdipine (NIFEDICAL XL) 30 MG 24 hr tablet Take 30 mg by mouth daily. 09/30/15   [provider]  OXYGEN Inhale 2 L into the lungs.    [provider]  potassium chloride SA (KLOR-CON) 20 MEQ tablet Take 20 mEq by mouth daily. 08/23/20   [provider]  sitaGLIPtin (JANUVIA) 25 MG tablet Take 1 tablet by mouth daily. 01/01/18   [provider]  traZODone (DESYREL) 100 MG tablet Take by mouth. 07/22/20   [provider]  umeclidinium-vilanterol (ANORO ELLIPTA) 62.5-25 MCG/INH AEPB Inhale 1 puff into the lungs daily. 02/06/20   Kendell Bane, NP  valsartan-hydrochlorothiazide (DIOVAN-HCT) 320-25 MG tablet Take 1 tablet by mouth daily. 04/14/16   [provider]  VENTOLIN HFA 108 (90 Base) MCG/ACT inhaler INHALE 2 PUFFS INTO THE LUNGS EVERY 6 HOURS AS NEEDED Patient taking differently: Inhale 2 puffs into the lungs every 6 (six) hours as needed for wheezing or shortness of breath.  02/18/19   Kendell Bane, NP    vitamin C (ASCORBIC ACID) 500 MG tablet Take 500 mg by mouth 2 (two) times daily.    [provider]    Physical Exam: Vitals:   09/28/20 1522 09/28/20 1800  BP: (!) 156/64 (!) 161/86  Pulse: (!) 108 (!) 102  Resp: 20 (!) 33  Temp: 98.9 F (37.2 C)   TempSrc: Oral   SpO2: 95% 100%  Weight: 86.2 kg   Height: 5\' 7"  (1.702 m)     Physical Exam Vitals and nursing note reviewed.  HENT:     Head: Normocephalic and atraumatic.     Right Ear: External ear normal.     Left Ear: External ear normal.  Eyes:     Extraocular Movements: Extraocular movements intact.  Cardiovascular:     Rate and Rhythm: Normal rate and regular rhythm.     Pulses: Normal pulses.     Heart sounds: Normal heart sounds.  Pulmonary:     Effort: Pulmonary effort is normal.     Breath sounds: Normal breath sounds.  Abdominal:     Palpations: Abdomen is soft.  Skin:    General: Skin is warm.  Neurological:     General: No focal deficit present.     Mental Status: She is alert and oriented to person, place, and time.  Psychiatric:        Mood and Affect: Mood normal.        Behavior: Behavior normal.     Labs on Admission: I have personally reviewed following labs and imaging studies  CBC: Recent Labs  Lab 09/28/20 1525  WBC 13.7*  NEUTROABS 11.6*  HGB 11.3*  HCT 36.2  MCV 90.5  PLT 160*   Basic Metabolic Panel: Recent Labs  Lab 09/28/20 1530  NA 139  K 3.6  CL 99  CO2 28  GLUCOSE 135*  BUN 16  CREATININE 0.93  CALCIUM 9.8   GFR: Estimated Creatinine Clearance: 57.1 mL/min (by C-G formula based on SCr of 0.93 mg/dL). Liver Function Tests: No results for input(s): AST, ALT, ALKPHOS, BILITOT, PROT, ALBUMIN in the last 168 hours. No results for input(s): LIPASE, AMYLASE in the last 168 hours. No results for input(s): AMMONIA in the last 168  hours. Coagulation Profile: No results for input(s): INR, PROTIME in the last 168 hours. Cardiac Enzymes: No results for input(s):  CKTOTAL, CKMB, CKMBINDEX, TROPONINI in the last 168 hours. BNP (last 3 results) No results for input(s): PROBNP in the last 8760 hours. HbA1C: No results for input(s): HGBA1C in the last 72 hours. CBG: No results for input(s): GLUCAP in the last 168 hours. Lipid Profile: No results for input(s): CHOL, HDL, LDLCALC, TRIG, CHOLHDL, LDLDIRECT in the last 72 hours. Thyroid Function Tests: No results for input(s): TSH, T4TOTAL, FREET4, T3FREE, THYROIDAB in the last 72 hours. Anemia Panel: No results for input(s): VITAMINB12, FOLATE, FERRITIN, TIBC, IRON, RETICCTPCT in the last 72 hours. Urine analysis:    Component Value Date/Time   COLORURINE YELLOW (A) 06/01/2019 1046   APPEARANCEUR HAZY (A) 06/01/2019 1046   LABSPEC 1.006 06/01/2019 1046   PHURINE 6.0 06/01/2019 1046   GLUCOSEU NEGATIVE 06/01/2019 1046   HGBUR NEGATIVE 06/01/2019 1046   BILIRUBINUR NEGATIVE 06/01/2019 1046   KETONESUR NEGATIVE 06/01/2019 1046   PROTEINUR 30 (A) 06/01/2019 1046   NITRITE NEGATIVE 06/01/2019 1046   LEUKOCYTESUR TRACE (A) 06/01/2019 1046   No intake or output data in the 24 hours ending 09/28/20 1955 Lab Results  Component Value Date   CREATININE 0.93 09/28/2020   CREATININE 1.04 (H) 06/22/2020   CREATININE 0.95 06/13/2020    COVID-19 Labs  No results for input(s): DDIMER, FERRITIN, LDH, CRP in the last 72 hours.  Lab Results  Component Value Date   [place] NEGATIVE 06/11/2020   [place] NEGATIVE 05/28/2020   [place] NEGATIVE 06/01/2019    Radiological Exams on Admission: DG Chest 2 View  Result Date: 09/28/2020 CLINICAL DATA:  79 year old female with possible foot infection. EXAM: CHEST - 2 VIEW COMPARISON:  Chest radiograph dated 06/22/2020. FINDINGS: There is mild cardiomegaly with mild vascular congestion. Small left pleural effusion and left lung base atelectasis or infiltrate. Trace right pleural effusion may be present. No pneumothorax. Osteopenia with degenerative  changes of the spine. No acute osseous pathology. IMPRESSION: 1. Cardiomegaly with mild vascular congestion. 2. Small left pleural effusion and left lung base atelectasis or infiltrate. Electronically Signed   By: Anner Crete M.D.   On: 09/28/2020 16:07   US Venous Img Lower Bilateral  Result Date: 09/28/2020 CLINICAL DATA:  79 year old female with bilateral lower extremity edema. EXAM: Bilateral LOWER EXTREMITY VENOUS DOPPLER ULTRASOUND TECHNIQUE: Gray-scale sonography with compression, as well as color and duplex ultrasound, were performed to evaluate the deep venous system(s) from the level of the common femoral vein through the popliteal and proximal calf veins. COMPARISON:  None. FINDINGS: VENOUS Normal compressibility of the common femoral, superficial femoral, and popliteal veins, as well as the visualized calf veins. Visualized portions of profunda femoral vein and great saphenous vein unremarkable. No filling defects to suggest DVT on grayscale or color Doppler imaging. Doppler waveforms show normal direction of venous flow, normal respiratory plasticity and response to augmentation. Limited views of the contralateral common femoral vein are unremarkable. OTHER None. Limitations: There is mild diffuse subcutaneous edema. No fluid collection. IMPRESSION: Negative. Electronically Signed   By: Anner Crete M.D.   On: 09/28/2020 18:40    EKG: Independently reviewed.      Assessment/Plan COPD with asthma ([place]) Assessment & Plan Stable pt is on ventolin,budesonide, Flonase,singulair.   Type 2 diabetes mellitus with microalbuminuria, without long-term current use of insulin (HCC) Assessment & Plan Home regimen with metformin and sitaglipitan and cont ssi/ glycemic protocol.   Seizure  disorder [facility]) Assessment & Plan Pt has not had seizures since about 50 years but pt is on antiepileptic meds and therefore is on keprra ,dr Brewing technologist.  Seizure precautions. Pt does not know if she is  on two seizure meds or one. We will cont keprra and lamictal.   Rash and nonspecific skin eruption Assessment & Plan Media Information   Document Information  Photos    09/28/2020 19:39  Attached To:  Hospital Encounter on 09/28/20  Source Information  Para Skeans, MD  [facility]   Rash appears to be intermittent and with similar appearance on left leg. ? 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 I have continued pt on iv abx but consider dermatology for skin biopsy etc. Cellulitis does not appear typical infectious rash.    Cellulitis of left leg Assessment & Plan    Anti-infectives (From admission, onward)   Start     Dose/Rate Route Frequency Ordered Stop   09/28/20 2000  vancomycin (VANCOCIN) IVPB 1000 mg/200 mL premix        1,000 mg 200 mL/hr over 60 Minutes Intravenous  Once 09/28/20 1754     09/28/20 1730  vancomycin (VANCOCIN) IVPB 1000 mg/200 mL premix        1,000 mg 200 mL/hr over 60 Minutes Intravenous  Once 09/28/20 1726 09/28/20 1923   09/28/20 1730  cefTRIAXone (ROCEPHIN) 2 g in sodium chloride 0.9 % 100 mL IVPB        2 g 200 mL/hr over 30 Minutes Intravenous  Once 09/28/20 1726 09/28/20 1831     Media Information   Document Information  Photos    09/28/2020 19:37  Attached To:  Hospital Encounter on 09/28/20  Source Information  Para Skeans, MD  [facility]   Media Information   Document Information  Photos    09/28/2020 19:38  Attached To:  Hospital Encounter on 09/28/20  Source Information  Para Skeans, MD  [facility]   Media Information   Document Information  Photos    09/28/2020 19:38  Attached To:  Hospital Encounter on 09/28/20  Source Information  Para Skeans, MD  [facility]   -By the cellulitis order set, this would actually be considered moderate  non-purulent cellulitis and iv abx  treatment is optimal method of treatment  with wound care.  -Admission in this patient is warranted due to:  -outpatient IV therapy is not appropriate because of progression of infection and clinical presentation is judged to require immediate initiation of IV antibiotics. -high-risk comorbid condition is indicated by very poorly controlled DM (A1c pending ).     DVT prophylaxis:  heparin   Code Status:  Full code   Family Communication:  None at bedside   Disposition Plan:  home   Consults called:  none  Admission status: Status is: Observation  Dispo: The patient is from: Home              Anticipated d/c is to: Home              Anticipated d/c date is: 2 days              Patient currently is not medically stable to d/c.   Para Skeans MD [facility] [phone] How to contact the [facility] Attending or Consulting provider [place] or covering provider during after hours [place], for this patient?    1. Check the care team in [facility] and look  for a) attending/consulting [place] provider listed and b) the [facility] team listed 2. Log into [email or web address] and use [place]'s universal password to access. If you do not have the password, please contact the hospital operator. 3. Locate the [facility] provider you are looking for under [facility] and page to a number that you can be directly reached. 4. If you still have difficulty reaching the provider, please page the [facility] (Director on Call) for the Hospitalists listed on amion for assistance. [email or web address] Password [facility] 09/28/2020, 7:55 PM

## 2020-09-28 NOTE — ED Notes (Signed)
Ultrasound at bedside

## 2020-09-28 NOTE — ED Notes (Signed)
Transport requested

## 2020-09-28 NOTE — Consult Note (Signed)
Pharmacy Antibiotic Note  Angelica Juarez is a 79 y.o. female admitted on 09/28/2020 with cellulitis.  Pharmacy has been consulted for cefepime and vancomycin dosing.  Plan: Cefepime 2g q12H   Pt received vancomycin 2000 mg loading dose. Will start vancomycin 1500 mg q24h per [place] [facility] (Wt 80-90 and CrCl 40-60). Plan to order vancomycin level in the next 2-3 days. Monitor renal function. Goal trough 10-15.   Height: 5\' 7"  (170.2 cm) Weight: 86.2 kg (190 lb) IBW/kg (Calculated) : 61.6  Temp (24hrs), Avg:98.9 F (37.2 C), Min:98.9 F (37.2 C), Max:98.9 F (37.2 C)  Recent Labs  Lab 09/28/20 1525 09/28/20 1530  WBC 13.7*  --   CREATININE  --  0.93  LATICACIDVEN 1.2  --     Estimated Creatinine Clearance: 57.1 mL/min (by C-G formula based on SCr of 0.93 mg/dL).    Allergies  Allergen Reactions  . Iodinated Diagnostic Agents Anaphylaxis    Other reaction(s): Other (See Comments) Throat swells and extreme hives  . Latex Itching  . Phenobarbital Hives  . Tape Rash    silicones    Antimicrobials this admission: 11/15 ceftriaxone x 1 11/15 cefepime >>  11/15 vancomycin >>   Dose adjustments this admission: none  Microbiology results: 11/15 BCx: pending  Thank you for allowing pharmacy to be a part of this patient's care.  Oswald Hillock 09/28/2020 8:03 PM

## 2020-09-28 NOTE — ED Triage Notes (Signed)
Pt to ED for chief complaint of left foot infection.  States she was taking lasix for edema to BLE, swelling went down in right foot and got worse in left foot. Left foot is now swollen with blisters and yellow discharge, states has looked this way since Saturday .   Hx COPD and diabetes. Pt wears 3L [place] continuously.   Discussed pt with Dr Ellender Hose

## 2020-09-28 NOTE — Assessment & Plan Note (Signed)
Stable pt is on ventolin,budesonide, Flonase,singulair.

## 2020-09-28 NOTE — Assessment & Plan Note (Addendum)
Media Information   Document Information  Photos    09/28/2020 19:39  Attached To:  Hospital Encounter on 09/28/20  Source Information  Para Skeans, MD  [facility]   Rash appears to be intermittent and with similar appearance on left leg. ? 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 I have continued pt on iv abx but consider dermatology for skin biopsy etc. Cellulitis does not appear typical infectious rash.

## 2020-09-29 ENCOUNTER — Encounter: Payer: Self-pay | Admitting: Internal Medicine

## 2020-09-29 DIAGNOSIS — Z91041 Radiographic dye allergy status: Secondary | ICD-10-CM | POA: Diagnosis not present

## 2020-09-29 DIAGNOSIS — Z20822 Contact with and (suspected) exposure to covid-19: Secondary | ICD-10-CM | POA: Diagnosis present

## 2020-09-29 DIAGNOSIS — D631 Anemia in chronic kidney disease: Secondary | ICD-10-CM | POA: Diagnosis present

## 2020-09-29 DIAGNOSIS — I5033 Acute on chronic diastolic (congestive) heart failure: Secondary | ICD-10-CM | POA: Diagnosis present

## 2020-09-29 DIAGNOSIS — E119 Type 2 diabetes mellitus without complications: Secondary | ICD-10-CM | POA: Diagnosis not present

## 2020-09-29 DIAGNOSIS — Z23 Encounter for immunization: Secondary | ICD-10-CM | POA: Diagnosis present

## 2020-09-29 DIAGNOSIS — J449 Chronic obstructive pulmonary disease, unspecified: Secondary | ICD-10-CM | POA: Diagnosis not present

## 2020-09-29 DIAGNOSIS — D75839 Thrombocytosis, unspecified: Secondary | ICD-10-CM | POA: Diagnosis present

## 2020-09-29 DIAGNOSIS — I10 Essential (primary) hypertension: Secondary | ICD-10-CM

## 2020-09-29 DIAGNOSIS — E1122 Type 2 diabetes mellitus with diabetic chronic kidney disease: Secondary | ICD-10-CM | POA: Diagnosis present

## 2020-09-29 DIAGNOSIS — I130 Hypertensive heart and chronic kidney disease with heart failure and stage 1 through stage 4 chronic kidney disease, or unspecified chronic kidney disease: Secondary | ICD-10-CM | POA: Diagnosis present

## 2020-09-29 DIAGNOSIS — L03116 Cellulitis of left lower limb: Secondary | ICD-10-CM | POA: Diagnosis not present

## 2020-09-29 DIAGNOSIS — J9611 Chronic respiratory failure with hypoxia: Secondary | ICD-10-CM | POA: Diagnosis present

## 2020-09-29 DIAGNOSIS — N179 Acute kidney failure, unspecified: Secondary | ICD-10-CM | POA: Diagnosis present

## 2020-09-29 DIAGNOSIS — I959 Hypotension, unspecified: Secondary | ICD-10-CM | POA: Diagnosis present

## 2020-09-29 DIAGNOSIS — Z8544 Personal history of malignant neoplasm of other female genital organs: Secondary | ICD-10-CM | POA: Diagnosis not present

## 2020-09-29 DIAGNOSIS — L129 Pemphigoid, unspecified: Secondary | ICD-10-CM | POA: Diagnosis present

## 2020-09-29 DIAGNOSIS — E785 Hyperlipidemia, unspecified: Secondary | ICD-10-CM | POA: Diagnosis present

## 2020-09-29 DIAGNOSIS — K52839 Microscopic colitis, unspecified: Secondary | ICD-10-CM | POA: Diagnosis present

## 2020-09-29 DIAGNOSIS — Z888 Allergy status to other drugs, medicaments and biological substances status: Secondary | ICD-10-CM | POA: Diagnosis not present

## 2020-09-29 DIAGNOSIS — Z91040 Latex allergy status: Secondary | ICD-10-CM | POA: Diagnosis not present

## 2020-09-29 DIAGNOSIS — G40909 Epilepsy, unspecified, not intractable, without status epilepticus: Secondary | ICD-10-CM | POA: Diagnosis present

## 2020-09-29 DIAGNOSIS — D45 Polycythemia vera: Secondary | ICD-10-CM | POA: Diagnosis present

## 2020-09-29 DIAGNOSIS — Z96653 Presence of artificial knee joint, bilateral: Secondary | ICD-10-CM | POA: Diagnosis present

## 2020-09-29 DIAGNOSIS — R21 Rash and other nonspecific skin eruption: Secondary | ICD-10-CM | POA: Diagnosis not present

## 2020-09-29 DIAGNOSIS — N1831 Chronic kidney disease, stage 3a: Secondary | ICD-10-CM | POA: Diagnosis present

## 2020-09-29 DIAGNOSIS — Z87891 Personal history of nicotine dependence: Secondary | ICD-10-CM | POA: Diagnosis not present

## 2020-09-29 LAB — GLUCOSE, CAPILLARY
Glucose-Capillary: 119 mg/dL — ABNORMAL HIGH (ref 70–99)
Glucose-Capillary: 149 mg/dL — ABNORMAL HIGH (ref 70–99)
Glucose-Capillary: 132 mg/dL — ABNORMAL HIGH (ref 70–99)
Glucose-Capillary: 174 mg/dL — ABNORMAL HIGH (ref 70–99)

## 2020-09-29 LAB — HEMOGLOBIN A1C
Hgb A1c MFr Bld: 5.7 % — ABNORMAL HIGH (ref 4.8–5.6)
Mean Plasma Glucose: 116.89 mg/dL

## 2020-09-29 MED ORDER — OXYCODONE HCL 5 MG PO TABS
5.0000 mg | ORAL_TABLET | Freq: Three times a day (TID) | ORAL | Status: AC | PRN
  Administered 2020-09-29 – 2020-10-01 (×4): 5 mg via ORAL
  Filled 2020-09-29 (×5): qty 1

## 2020-09-29 MED ORDER — FUROSEMIDE 10 MG/ML IJ SOLN
40.0000 mg | Freq: Once | INTRAMUSCULAR | Status: AC
  Administered 2020-09-29: 40 mg via INTRAVENOUS
  Filled 2020-09-29: qty 4

## 2020-09-29 MED ORDER — LAMOTRIGINE 25 MG PO TABS: 150.0000 mg | ORAL_TABLET | Freq: Two times a day (BID) | ORAL | Status: DC

## 2020-09-29 MED ORDER — LAMOTRIGINE 25 MG PO TABS: 125.0000 mg | ORAL_TABLET | Freq: Two times a day (BID) | ORAL | Status: DC

## 2020-09-29 MED ORDER — ACETAMINOPHEN 325 MG PO TABS
650.0000 mg | ORAL_TABLET | Freq: Four times a day (QID) | ORAL | Status: DC | PRN
  Administered 2020-10-01 – 2020-10-05 (×6): 650 mg via ORAL
  Filled 2020-09-29 (×7): qty 2

## 2020-09-29 MED ORDER — HYDROXYUREA 500 MG PO CAPS
500.0000 mg | ORAL_CAPSULE | ORAL | Status: DC
  Administered 2020-10-05 (×2): 500 mg via ORAL
  Filled 2020-09-29 (×2): qty 1

## 2020-09-29 MED ORDER — FUROSEMIDE 20 MG PO TABS
20.0000 mg | ORAL_TABLET | Freq: Two times a day (BID) | ORAL | Status: DC
  Administered 2020-09-30 (×2): 20 mg via ORAL
  Filled 2020-09-29 (×2): qty 1

## 2020-09-29 MED ORDER — HYDROXYUREA 500 MG PO CAPS
500.0000 mg | ORAL_CAPSULE | ORAL | Status: DC
  Administered 2020-09-29 – 2020-10-06 (×7): 500 mg via ORAL
  Filled 2020-09-29 (×7): qty 1

## 2020-09-29 MED ORDER — LAMOTRIGINE 100 MG PO TABS
100.0000 mg | ORAL_TABLET | Freq: Two times a day (BID) | ORAL | Status: DC
  Administered 2020-09-29 – 2020-10-02 (×6): 100 mg via ORAL
  Filled 2020-09-29 (×7): qty 1

## 2020-09-29 MED ORDER — FUROSEMIDE 10 MG/ML IJ SOLN
40.0000 mg | Freq: Two times a day (BID) | INTRAMUSCULAR | Status: AC
  Administered 2020-09-29: 40 mg via INTRAVENOUS
  Filled 2020-09-29 (×2): qty 4

## 2020-09-29 MED ORDER — LAMOTRIGINE 25 MG PO TABS: 25.0000 mg | ORAL_TABLET | Freq: Every day | ORAL | Status: DC

## 2020-09-29 MED ORDER — LAMOTRIGINE 100 MG PO TABS: 100.0000 mg | ORAL_TABLET | Freq: Two times a day (BID) | ORAL | Status: DC

## 2020-09-29 MED ORDER — LAMOTRIGINE 25 MG PO TABS: 125.0000 mg | ORAL_TABLET | Freq: Every day | ORAL | Status: DC

## 2020-09-29 MED ORDER — LAMOTRIGINE 100 MG PO TABS: 100.0000 mg | ORAL_TABLET | Freq: Every day | ORAL | Status: DC

## 2020-09-29 NOTE — Progress Notes (Addendum)
Progress Note    Angelica Juarez  YQI:[id number] DOB: 25-Aug-1941  DOA: 09/28/2020 PCP: Sofie Hartigan, MD      Brief Narrative:    Medical records reviewed and are as summarized below:  Angelica Juarez is a 79 y.o. 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  She presented to the hospital with increasing bilateral leg swelling and left leg pain.  Swelling had gotten worse to the point that she developed lesions and blisters on the legs associated with fluid started oozing from her legs.  She was admitted to the hospital for left leg cellulitis.  She was treated with empiric IV antibiotics.  She was also found to have acute exacerbation of chronic diastolic CHF.  She was treated with IV Lasix.     Assessment/Plan:   Principal Problem:   Cellulitis of left leg Active Problems:   Acute on chronic diastolic CHF (congestive heart failure) (HCC)   COPD with asthma (HCC)   Essential hypertension   Seizure disorder (HCC)   Type 2 diabetes mellitus with microalbuminuria, without long-term current use of insulin (HCC)   Rash and nonspecific skin eruption   Body mass index is 29.76 kg/m.    Left leg cellulitis: Continue empiric IV antibiotics.  Acute on chronic diastolic CHF: Treat with IV Lasix.  Monitor BMP, daily weight and urine output.  2D echo on 06/12/2020 showed EF estimated at 50 to 55%, normal LV diastolic parameters, mild to moderate mitral regurgitation, mild to moderate TR  COPD with asthma: Compensated.  Continue bronchodilators  Chronic hypoxemic respiratory failure: Continue 3 L/min oxygen via nasal cannula.  (her baseline).  Seizure disorder: Continue antiepileptics.  Information below (regarding antiepileptics) was copied directly from  Dr. Trena Platt (neurologit) progress note.  "Angelica Juarez - 07/22/2020 12:45 PM EDT  Formatting of this note might be different from the original. - Continue taking Keppra 500 mg twice daily, refilled. '  - Start taking Lamotrigine for seizures (25 mg at night, increase by 25 mg every week until you are taking 150 mg twice a day). Week 1: 1 tablet each night this week. Week 2: 1 tablet each morning and 1 tablet each night this week. Week 3: 1 tablet each morning and 2 tablets each night this week. Week 4: 2 tablets each morning and 2 tablets each night this week. Week 5: 2 tablets each morning and 3 tablets each night this week. Week 6: 3 tablets each morning and 3 tablets each night this week. Week 7: 3 tablets each morning and 4 tablets each night this week. Week 8: 4 tablets each morning and 4 tablets each night this week. Week 9: 4 tablets each morning and 5 tablets each night this week. Week 10: 5 tablets each morning and 5 tablets each night this week. Week 11: 5 tablets each morning and 6 tablets each night this week.  Week 12: 6 tabs each morning and 6 tabs each night this week."     Type II DM: Hemoglobin A1c is 5.7.  Itchy skin lesions (desquamated/erosive lesions with erythematous area on the left upper back), lesions on posterior right leg and anterior left foot: Etiology unclear.  She said she has had shoulder lesions on and off for about 2 weeks and lesions on extremities for about 4 days. Continue  to monitor. [facility] dermatology office but there was no response. Outpatient follow up with dermatologist recommended  Other comorbidities include hypertension, polycythemia vera, history of microscopic colitis status post treatment with budesonide and mesalamine      Diet Order            Diet heart healthy/carb modified Room service appropriate? Yes; Fluid consistency: Thin  Diet effective now                     Consultants:  None  Procedures:  None    Medications:   . apixaban  5 mg Oral BID  . vitamin C  500 mg Oral BID  . aspirin EC  81 mg Oral Daily  . atorvastatin  40 mg Oral Daily  . B-complex with vitamin C   Oral Daily  . fluticasone  2 spray Each Nare Daily  . furosemide  40 mg Intravenous BID  . [START ON 09/30/2020] furosemide  20 mg Oral BID  . hydrochlorothiazide  25 mg Oral Daily  . hydroxyurea  500 mg Oral Once per day on Sun Tue Wed Thu Fri Sat  . [START ON 10/05/2020] hydroxyurea  500 mg Oral 2 times per day every 7 days  . influenza vaccine adjuvanted  0.5 mL Intramuscular Tomorrow-1000  . insulin aspart  0-15 Units Subcutaneous TID WC  . irbesartan  300 mg Oral Daily  . [START ON 10/05/2020] lamoTRIgine  100 mg Oral BID   Followed by  . lamoTRIgine  100 mg Oral BID   Followed by  . [START ON 10/12/2020] lamoTRIgine  125 mg Oral BID   Followed by  . [START ON 10/19/2020] lamoTRIgine  125 mg Oral BID  . [START ON 10/26/2020] lamoTRIgine  150 mg Oral BID  . [START ON 10/05/2020] lamoTRIgine  25 mg Oral QHS  . [START ON 10/19/2020] lamoTRIgine  25 mg Oral QHS  . levETIRAcetam  500 mg Oral BID  . montelukast  10 mg Oral Daily  . NIFEdipine  30 mg Oral Daily  . traZODone  50 mg Oral QHS  . umeclidinium-vilanterol  1 puff Inhalation Daily   Continuous Infusions: . sodium chloride 250 mL (09/29/20 0051)  . ceFEPime (MAXIPIME) IV 2 g (09/29/20 0052)  . vancomycin       Anti-infectives (From admission, onward)   Start     Dose/Rate Route Frequency Ordered Stop   09/29/20 2000  vancomycin (VANCOREADY) IVPB 1500 mg/300 mL        1,500 mg 150 mL/hr over 120 Minutes Intravenous Every 24 hours 09/28/20 2007     09/28/20 2100  ceFEPIme (MAXIPIME) 2 g in sodium chloride 0.9 % 100 mL IVPB        2 g 200 mL/hr over 30 Minutes Intravenous Every 12 hours 09/28/20 2007     09/28/20 2000  vancomycin (VANCOCIN) IVPB 1000 mg/200 mL premix        1,000 mg 200  mL/hr over 60 Minutes Intravenous  Once 09/28/20 1754 09/28/20 2038   09/28/20 1730  vancomycin (VANCOCIN) IVPB 1000 mg/200 mL premix        1,000 mg 200 mL/hr over 60 Minutes Intravenous  Once 09/28/20 1726 09/28/20 1934   09/28/20 1730  cefTRIAXone (ROCEPHIN) 2 g in sodium chloride 0.9 % 100 mL IVPB        2 g 200 mL/hr over 30 Minutes Intravenous  Once 09/28/20 1726 09/28/20 1831  Family Communication/Anticipated D/C date and plan/Code Status   DVT prophylaxis:  apixaban (ELIQUIS) tablet 5 mg     Code Status: Full Code  Family Communication: Plan discussed with  Disposition Plan:    Status is: Observation  The patient will require care spanning > 2 midnights and should be moved to inpatient because: IV treatments appropriate due to intensity of illness or inability to take PO and Inpatient level of care appropriate due to severity of illness  Dispo: The patient is from: Home              Anticipated d/c is to: Home              Anticipated d/c date is: 2 days              Patient currently is not medically stable to d/c.           Subjective:   C/o bilateral leg swelling and pain in the left leg  Objective:    Vitals:   09/29/20 0413 09/29/20 1015 09/29/20 1106 09/29/20 1144  BP: (!) 138/54 (!) 121/48 (!) 120/55 112/61  Pulse: (!) 107 (!) 107 (!) 109 96  Resp: 20 20 18    Temp: 98.2 F (36.8 C) 97.7 F (36.5 C) 97.8 F (36.6 C) 97.6 F (36.4 C)  TempSrc: Oral Oral  Oral  SpO2: 94% 93% 92% 94%  Weight:      Height:       No data found.  No intake or output data in the 24 hours ending 09/29/20 1220 Filed Weights   09/28/20 1522  Weight: 86.2 kg    Exam:  GEN: NAD SKIN: Warm and dry. Desquamated/erosive lesions and erythematous area on left upper back/shouldern  EYES: No pallor or icterus ENT: MMM CV: RRR PULM: CTA B ABD: soft, ND, NT, +BS CNS: AAO x 3, non focal EXT: B/l leg edema (2+), left dista leg and foot erythema  with tenderness.  Desquamated lesions /unroofed blisters on posterior right leg and anterior left foot   Data Reviewed:   I have personally reviewed following labs and imaging studies:  Labs: Labs show the following:   Basic Metabolic Panel: Recent Labs  Lab 09/28/20 1530 09/28/20 2149  NA 139  --   K 3.6  --   CL 99  --   CO2 28  --   GLUCOSE 135*  --   BUN 16  --   CREATININE 0.93 0.92  CALCIUM 9.8  --    GFR Estimated Creatinine Clearance: 57.7 mL/min (by C-G formula based on SCr of 0.92 mg/dL). Liver Function Tests: No results for input(s): AST, ALT, ALKPHOS, BILITOT, PROT, ALBUMIN in the last 168 hours. No results for input(s): LIPASE, AMYLASE in the last 168 hours. No results for input(s): AMMONIA in the last 168 hours. Coagulation profile No results for input(s): INR, PROTIME in the last 168 hours.  CBC: Recent Labs  Lab 09/28/20 1525 09/28/20 2149  WBC 13.7* 14.4*  NEUTROABS 11.6*  --   HGB 11.3* 11.3*  HCT 36.2 36.5  MCV 90.5 90.6  PLT 545* 571*   Cardiac Enzymes: No results for input(s): CKTOTAL, CKMB, CKMBINDEX, TROPONINI in the last 168 hours. BNP (last 3 results) No results for input(s): PROBNP in the last 8760 hours. CBG: Recent Labs  Lab 09/28/20 2229 09/29/20 0828  GLUCAP 104* 119*   D-Dimer: No results for input(s): DDIMER in the last 72 hours. Hgb A1c: Recent Labs    09/28/20  2149  HGBA1C 5.7*   Lipid Profile: No results for input(s): CHOL, HDL, LDLCALC, TRIG, CHOLHDL, LDLDIRECT in the last 72 hours. Thyroid function studies: No results for input(s): TSH, T4TOTAL, T3FREE, THYROIDAB in the last 72 hours.  Invalid input(s): FREET3 Anemia work up: No results for input(s): VITAMINB12, FOLATE, FERRITIN, TIBC, IRON, RETICCTPCT in the last 72 hours. Sepsis Labs: Recent Labs  Lab 09/28/20 1525 09/28/20 2149  WBC 13.7* 14.4*  LATICACIDVEN 1.2 1.6    Microbiology Recent Results (from the past 240 hour(s))  Culture, blood  (Routine x 2)     Status: None (Preliminary result)   Collection Time: 09/28/20  3:29 PM   Specimen: BLOOD  Result Value Ref Range Status   Specimen Description BLOOD RIGHT ANTECUBITAL  Final   Special Requests   Final    BOTTLES DRAWN AEROBIC AND ANAEROBIC Blood Culture adequate volume   Culture   Final    NO GROWTH < 24 HOURS Performed at [facility] [address] [place], [place] [address]    Report Status PENDING  Incomplete  Culture, blood (Routine x 2)     Status: None (Preliminary result)   Collection Time: 09/28/20  5:47 PM   Specimen: BLOOD  Result Value Ref Range Status   Specimen Description BLOOD LEFT ANTECUBITAL  Final   Special Requests   Final    BOTTLES DRAWN AEROBIC AND ANAEROBIC Blood Culture results may not be optimal due to an excessive volume of blood received in culture bottles   Culture   Final    NO GROWTH < 24 HOURS Performed at [facility], [address], [place], [place] [address]    Report Status PENDING  Incomplete  Respiratory Panel by RT PCR (Flu A&B, Covid) - Nasopharyngeal Swab     Status: None   Collection Time: 09/28/20  6:37 PM   Specimen: Nasopharyngeal Swab  Result Value Ref Range Status   SARS Coronavirus 2 by RT PCR NEGATIVE NEGATIVE Final    Comment: (NOTE) SARS-CoV-2 target nucleic acids are NOT DETECTED.  The SARS-CoV-2 RNA is generally detectable in upper respiratoy specimens during the acute phase of infection. The lowest concentration of SARS-CoV-2 viral copies this assay can detect is 131 copies/mL. A negative result does not preclude SARS-Cov-2 infection and should not be used as the sole basis for treatment or other patient management decisions. A negative result may occur with  improper specimen collection/handling, submission of specimen other than nasopharyngeal swab, presence of viral mutation(s) within the areas targeted by this assay, and inadequate number of viral copies (<131 copies/mL). A  negative result must be combined with clinical observations, patient history, and epidemiological information. The expected result is Negative.  Fact Sheet for Patients:  PinkCheek.be  Fact Sheet for Healthcare Providers:  GravelBags.it  This test is no t yet approved or cleared by the [place] FDA and  has been authorized for detection and/or diagnosis of SARS-CoV-2 by FDA under an Emergency Use Authorization (EUA). This EUA will remain  in effect (meaning this test can be used) for the duration of the COVID-19 declaration under Section 564(b)(1) of the Act, 21 U.S.C. section 360bbb-3(b)(1), unless the authorization is terminated or revoked sooner.     Influenza A by PCR NEGATIVE NEGATIVE Final   Influenza B by PCR NEGATIVE NEGATIVE Final    Comment: (NOTE) The Xpert Xpress SARS-CoV-2/FLU/RSV assay is intended as an aid in  the diagnosis of influenza from Nasopharyngeal swab specimens and  should not  be used as a sole basis for treatment. Nasal washings and  aspirates are unacceptable for Xpert Xpress SARS-CoV-2/FLU/RSV  testing.  Fact Sheet for Patients: PinkCheek.be  Fact Sheet for Healthcare Providers: GravelBags.it  This test is not yet approved or cleared by the [place] FDA and  has been authorized for detection and/or diagnosis of SARS-CoV-2 by  FDA under an Emergency Use Authorization (EUA). This EUA will remain  in effect (meaning this test can be used) for the duration of the  Covid-19 declaration under Section 564(b)(1) of the Act, 21  U.S.C. section 360bbb-3(b)(1), unless the authorization is  terminated or revoked. Performed at [facility], [address] [place], [place] [address]   Aerobic Culture (superficial specimen)     Status: None (Preliminary result)   Collection Time: 09/28/20 11:22 PM   Specimen: SHOULDER   Result Value Ref Range Status   Specimen Description   Final    SHOULDER LEFT Performed at [facility], [place] [address], [place], [place] [address]    Special Requests   Final    NONE Performed at [facility], [address] [place], [place] [address]    Gram Stain   Final    NO WBC SEEN NO ORGANISMS SEEN Performed at [facility], [place] [address], [place], [place] [address]    Culture PENDING  Incomplete   Report Status PENDING  Incomplete  Aerobic Culture (superficial specimen)     Status: None (Preliminary result)   Collection Time: 09/28/20 11:22 PM   Specimen: Leg  Result Value Ref Range Status   Specimen Description   Final    LEG LEFT Performed at [facility], [address] [place], [place] [address]    Special Requests   Final    NONE Performed at [facility], [address] [place], [place] [address]    Gram Stain   Final    NO WBC SEEN FEW GRAM POSITIVE RODS Performed at [facility], [place] [address], [place], [place] [address]    Culture PENDING  Incomplete   Report Status PENDING  Incomplete    Procedures and diagnostic studies:  DG Chest 2 View  Result Date: 09/28/2020 CLINICAL DATA:  79 year old female with possible foot infection. EXAM: CHEST - 2 VIEW COMPARISON:  Chest radiograph dated 06/22/2020. FINDINGS: There is mild cardiomegaly with mild vascular congestion. Small left pleural effusion and left lung base atelectasis or infiltrate. Trace right pleural effusion may be present. No pneumothorax. Osteopenia with degenerative changes of the spine. No acute osseous pathology. IMPRESSION: 1. Cardiomegaly with mild vascular congestion. 2. Small left pleural effusion and left lung base atelectasis or infiltrate. Electronically Signed   By: Anner Crete M.D.   On: 09/28/2020 16:07   US Venous Img Lower Bilateral  Result Date: 09/28/2020 CLINICAL DATA:  79 year old female with bilateral lower  extremity edema. EXAM: Bilateral LOWER EXTREMITY VENOUS DOPPLER ULTRASOUND TECHNIQUE: Gray-scale sonography with compression, as well as color and duplex ultrasound, were performed to evaluate the deep venous system(s) from the level of the common femoral vein through the popliteal and proximal calf veins. COMPARISON:  None. FINDINGS: VENOUS Normal compressibility of the common femoral, superficial femoral, and popliteal veins, as well as the visualized calf veins. Visualized portions of profunda femoral vein and great saphenous vein unremarkable. No filling defects to suggest DVT on grayscale or color Doppler imaging. Doppler waveforms show normal direction of venous flow, normal respiratory plasticity and response  to augmentation. Limited views of the contralateral common femoral vein are unremarkable. OTHER None. Limitations: There is mild diffuse subcutaneous edema. No fluid collection. IMPRESSION: Negative. Electronically Signed   By: Anner Crete M.D.   On: 09/28/2020 18:40               LOS: 0 days   Tanessa Tidd  [facility]   Pager on [email or web address]. If 7PM-7AM, please contact night-coverage at [email or web address]     09/29/2020, 12:20 PM

## 2020-09-30 DIAGNOSIS — L03116 Cellulitis of left lower limb: Secondary | ICD-10-CM | POA: Diagnosis not present

## 2020-09-30 DIAGNOSIS — N179 Acute kidney failure, unspecified: Secondary | ICD-10-CM | POA: Diagnosis not present

## 2020-09-30 DIAGNOSIS — I5033 Acute on chronic diastolic (congestive) heart failure: Secondary | ICD-10-CM | POA: Diagnosis not present

## 2020-09-30 LAB — BASIC METABOLIC PANEL
Anion gap: 12 (ref 5–15)
BUN: 25 mg/dL — ABNORMAL HIGH (ref 8–23)
CO2: 28 mmol/L (ref 22–32)
Calcium: 9.4 mg/dL (ref 8.9–10.3)
Chloride: 99 mmol/L (ref 98–111)
Creatinine, Ser: 1.67 mg/dL — ABNORMAL HIGH (ref 0.44–1.00)
GFR, Estimated: 31 mL/min — ABNORMAL LOW (ref >60)
Glucose, Bld: 146 mg/dL — ABNORMAL HIGH (ref 70–99)
Potassium: 3.6 mmol/L (ref 3.5–5.1)
Sodium: 139 mmol/L (ref 135–145)

## 2020-09-30 LAB — CBC WITH DIFFERENTIAL/PLATELET
Abs Immature Granulocytes: 0.2 10*3/uL — ABNORMAL HIGH (ref 0.00–0.07)
Basophils Absolute: 0.1 10*3/uL (ref 0.0–0.1)
Basophils Relative: 1 %
Eosinophils Absolute: 0.4 10*3/uL (ref 0.0–0.5)
Eosinophils Relative: 3 %
HCT: 36.6 % (ref 36.0–46.0)
Hemoglobin: 11.1 g/dL — ABNORMAL LOW (ref 12.0–15.0)
Immature Granulocytes: 1 %
Lymphocytes Relative: 3 %
Lymphs Abs: 0.5 10*3/uL — ABNORMAL LOW (ref 0.7–4.0)
MCH: 27.9 pg (ref 26.0–34.0)
MCHC: 30.3 g/dL (ref 30.0–36.0)
MCV: 92 fL (ref 80.0–100.0)
Monocytes Absolute: 1.4 10*3/uL — ABNORMAL HIGH (ref 0.1–1.0)
Monocytes Relative: 9 %
Neutro Abs: 13.6 10*3/uL — ABNORMAL HIGH (ref 1.7–7.7)
Neutrophils Relative %: 83 %
Platelets: 531 10*3/uL — ABNORMAL HIGH (ref 150–400)
RBC: 3.98 MIL/uL (ref 3.87–5.11)
RDW: 15.3 % (ref 11.5–15.5)
WBC: 16.3 10*3/uL — ABNORMAL HIGH (ref 4.0–10.5)
nRBC: 0 % (ref 0.0–0.2)

## 2020-09-30 LAB — GLUCOSE, CAPILLARY
Glucose-Capillary: 112 mg/dL — ABNORMAL HIGH (ref 70–99)
Glucose-Capillary: 128 mg/dL — ABNORMAL HIGH (ref 70–99)
Glucose-Capillary: 130 mg/dL — ABNORMAL HIGH (ref 70–99)
Glucose-Capillary: 134 mg/dL — ABNORMAL HIGH (ref 70–99)

## 2020-09-30 LAB — MAGNESIUM: Magnesium: 1.8 mg/dL (ref 1.7–2.4)

## 2020-09-30 MED ORDER — VANCOMYCIN HCL 1500 MG/300ML IV SOLN
1500.0000 mg | INTRAVENOUS | Status: DC
  Administered 2020-10-01: 1500 mg via INTRAVENOUS
  Filled 2020-09-30: qty 300

## 2020-09-30 MED ORDER — SODIUM CHLORIDE 0.9% FLUSH
10.0000 mL | Freq: Two times a day (BID) | INTRAVENOUS | Status: DC
  Administered 2020-09-30 – 2020-10-06 (×11): 10 mL

## 2020-09-30 MED ORDER — SODIUM CHLORIDE 0.9% FLUSH: 10.0000 mL | INTRAVENOUS | Status: DC | PRN

## 2020-09-30 MED ORDER — SODIUM CHLORIDE 0.9 % IV SOLN
2.0000 g | Freq: Once | INTRAVENOUS | Status: AC
  Administered 2020-09-30: 2 g via INTRAVENOUS
  Filled 2020-09-30: qty 2

## 2020-09-30 NOTE — Progress Notes (Signed)
PT Cancellation Note  Patient Details Name: Angelica Juarez MRN: [id number] DOB: [birth year]   Cancelled Treatment:    Reason Eval/Treat Not Completed: Patient at procedure or test/unavailable (Consult received and chart reviewed.  Patient just finished with dressing changes; now with IV team for midline placement.  Will re-attempt after lunch as medically appropriate and available.)   Thompson Mckim H. Owens Shark, PT, DPT, NCS 09/30/20, 11:56 AM [phone]

## 2020-09-30 NOTE — Consult Note (Signed)
Pharmacy Antibiotic Note  Angelica Juarez is a 80 y.o. female admitted on 09/28/2020 with left leg cellulitis.  Pharmacy was consulted for cefepime and vancomycin dosing. Since admission her renal function markedly worsened overnight. This is day # 3 of broad-spectrum IV antibiotics. The most recent vancomycin dose was 09/29/20 at 2042.  Plan:  1) continue cefepime 2 grams IV every 12 hours   2) adjust vancomycin dose to 1500 mg IV every 48 hours  Ke: 0.027 h-1, T1/2: 25.5 h  Daily SCr while on vancomycin to assess renal function  Random vancomycin level in am to assess clearance  Height: 5\' 7"  (170.2 cm) Weight: 104.4 kg (230 lb 2.6 oz) IBW/kg (Calculated) : 61.6  Temp (24hrs), Avg:97.9 F (36.6 C), Min:97.5 F (36.4 C), Max:98.8 F (37.1 C)  Recent Labs  Lab 09/28/20 1525 09/28/20 1530 09/28/20 2149 09/30/20 0557  WBC 13.7*  --  14.4* 16.3*  CREATININE  --  0.93 0.92 1.67*  LATICACIDVEN 1.2  --  1.6  --     Antimicrobials this admission: 11/15 ceftriaxone x 1 11/15 cefepime >>  11/15 vancomycin >>   Microbiology results: 11/15 WCx (shoulder): NG (pending) 11/15 WCx (leg): NG (pending) 11/15 BCx: NG x 2 days 11/15 SARS CoV-2: negative 11/15 influenza A/B: negative  Thank you for allowing pharmacy to be a part of this patient's care.  Dallie Piles 09/30/2020 2:21 PM

## 2020-09-30 NOTE — Progress Notes (Signed)
Wound care completed to bilateral lower extremities per wound care order. Tolerated well. Primary RN notified and IV RN at bedside for IV insertion.

## 2020-09-30 NOTE — Plan of Care (Signed)
  Problem: Pain Managment: Goal: General experience of comfort will improve Outcome: Not Progressing   

## 2020-09-30 NOTE — Evaluation (Signed)
Occupational Therapy Evaluation Patient Details Name: Angelica Juarez MRN: [id number] DOB: [birth year] Today's Date: 09/30/2020    History of Present Illness Pt is a 79 y/o F with PMH: HTN, HLD, T2DM, CKD, CHF, pAfib, seixures, OSA on CPAP and COPD on 2Lnc chronically who presented to ED d/t open wounds on the left lower leg and foot with thick discharge, onset 2 days PTA and worsening.   Clinical Impression   Pt seen for OT evaluation this date in setting of acute hospitalization d/t L LE cellulitis. Pt lives with spouse in [facility] with 2 STE and reports using RW at baseline and being INDEP with BADLs with some assist from spouse for IADLs such as driving/getting groceries. Pt reports she was getting progressively weaker since starting "water pills" a few days before this admission and started to note swelling in LEs. This date, pt requires MIN/MOD A with ADL transfers with RW, MOD A with LB ADLs including dressing and SETUP with seated UB ADLs including bathing. Pt would benefit from continued skilled OT in acute setting and anticipate pt will require continued f/u at STR upon d/c.     Follow Up Recommendations  SNF    Equipment Recommendations  Tub/shower seat    Recommendations for Other Services       Precautions / Restrictions Precautions Precautions: Fall Restrictions Weight Bearing Restrictions: No      Mobility Bed Mobility Overal bed mobility: Needs Assistance Bed Mobility: Supine to Sit     Supine to sit: Min assist          Transfers Overall transfer level: Needs assistance Equipment used: Rolling walker (2 wheeled) Transfers: Sit to/from Stand Sit to Stand: Min assist;Mod assist              Balance Overall balance assessment: Needs assistance   Sitting balance-Leahy Scale: Good       Standing balance-Leahy Scale: Fair Standing balance comment: UE support with RW                           ADL either performed or assessed with clinical  judgement   ADL                                         General ADL Comments: MOD A for seated LB ADLs, SETUP for seated UB ADLs. MIN/MOD A for ADL transfers with RW.     Vision Baseline Vision/History: Wears glasses Wears Glasses: At all times Patient Visual Report: No change from baseline       Perception     Praxis      Pertinent Vitals/Pain Pain Assessment: Faces Faces Pain Scale: Hurts even more Pain Location: L LE/foot Pain Descriptors / Indicators: Tender;Sore Pain Intervention(s): Limited activity within patient's tolerance;Monitored during session;Repositioned (to recliner with LEs elevated)     Hand Dominance     Extremity/Trunk Assessment Upper Extremity Assessment Upper Extremity Assessment: Overall WFL for tasks assessed;Generalized weakness (ROM WFL, MMT grossly 4-/5)   Lower Extremity Assessment Lower Extremity Assessment: Generalized weakness;LLE deficits/detail LLE Deficits / Details: not formally assessed d/t open wounds       Communication Communication Communication: No difficulties   Cognition Arousal/Alertness: Awake/alert Behavior During Therapy: WFL for tasks assessed/performed Overall Cognitive Status: Within Functional Limits for tasks assessed  General Comments       Exercises Other Exercises Other Exercises: OT educates re: role of OT in acute setting, benefits of OOB Activity. Pt with moderate reception.   Shoulder Instructions      Home Living Family/patient expects to be discharged to:: Private residence Living Arrangements: Spouse/significant other Available Help at Discharge: Family;Available 24 hours/day Type of Home: House Home Access: Stairs to enter [employer] of Steps: 2 Entrance Stairs-Rails: Right Home Layout: One level     Bathroom Shower/Tub: Walk-in shower         Home Equipment: Environmental consultant - 2 wheels;Walker - 4 wheels;Bedside  commode;Shower seat (BSC over standard commode to elevate)   Additional Comments: on 2Lnc chronically      Prior Functioning/Environment Level of Independence: Needs assistance  Gait / Transfers Assistance Needed: typically uses 2WW at baseline ADL's / Homemaking Assistance Needed: able to perform BADLs I'ly, but requires assist for IADLs, states her husband drives and gets groceries and has a person that assists with cleaning 2x/month.            OT Problem List: Decreased strength;Decreased activity tolerance;Impaired balance (sitting and/or standing);Obesity;Pain;Decreased knowledge of use of DME or AE      OT Treatment/Interventions: Self-care/ADL training;DME and/or AE instruction;Therapeutic activities;Balance training;Energy conservation;Patient/family education;Therapeutic exercise    OT Goals(Current goals can be found in the care plan section) Acute Rehab OT Goals Patient Stated Goal: to go home OT Goal Formulation: With patient Time For Goal Achievement: 10/14/20 Potential to Achieve Goals: Good ADL Goals Pt Will Transfer to Toilet: with supervision Pt Will Perform Toileting - Clothing Manipulation and hygiene: with supervision Pt Will Perform Tub/Shower Transfer: with supervision (with LRAD)  OT Frequency: Min 1X/week   Barriers to D/C:            Co-evaluation              AM-PAC OT "6 Clicks" Daily Activity     Outcome Measure Help from another person eating meals?: None Help from another person taking care of personal grooming?: A Little Help from another person toileting, which includes using toliet, bedpan, or urinal?: A Lot Help from another person bathing (including washing, rinsing, drying)?: A Lot Help from another person to put on and taking off regular upper body clothing?: A Little Help from another person to put on and taking off regular lower body clothing?: A Lot 6 Click Score: 16   End of Session Equipment Utilized During Treatment:  Gait belt;Rolling walker;Oxygen Nurse Communication: Mobility status  Activity Tolerance: Patient tolerated treatment well Patient left: in chair;with call bell/phone within reach;with chair alarm set  OT Visit Diagnosis: Unsteadiness on feet (R26.81);Muscle weakness (generalized) (M62.81)                Time: 4982-6415 OT Time Calculation (min): 38 min Charges:  OT General Charges $OT Visit: 1 Visit OT Evaluation $OT Eval Moderate Complexity: 1 Mod OT Treatments $Self Care/Home Management : 8-22 mins $Therapeutic Activity: 8-22 mins  Gerrianne Scale, MS, OTR/L ascom [phone] 09/30/20, 3:33 PM

## 2020-09-30 NOTE — TOC Initial Note (Signed)
Transition of Care [facility]) - Initial/Assessment Note    Patient Details  Name: Angelica Juarez MRN: [id number] Date of Birth: 04/28/1941  [facility] CM/SW Contact:    Beverly Sessions, RN Phone Number: 09/30/2020, 9:52 AM  Clinical Narrative:                 Patient admitted with cellulitis of the leg and found to be in acute exacerbation of CHF. Patient states that she lives at home with her significant other who provides transportation to appointments  PCP [place] - denies issues obtaining medications Patient states that she wears 2L chronic O2 at home and has portable O2.  Does not recall the company  Patient in agreement for home health RN for management of CHF and COPD.  States she does not have a preference of agency.  Referral made and accepted by Corene Cornea with [facility]  Patient confirms that she has a scale in the home to monitor daily weights   Expected Discharge Plan: [place] Barriers to Discharge: Continued Medical Work up   Patient Goals and CMS Choice        Expected Discharge Plan and Services Expected Discharge Plan: [place]   Discharge Planning Services: CM Consult   Living arrangements for the past 2 months: Single Family Home                           HH Arranged: RN [facility] Agency: [place] ([place]) Date HH Agency Contacted: 09/30/20   Representative spoke with at [place]: Corene Cornea  Prior Living Arrangements/Services Living arrangements for the past 2 months: [place] Lives with:: Significant Other Patient language and need for interpreter reviewed:: Yes Do you feel safe going back to the place where you live?: Yes      Need for Family Participation in Patient Care: Yes (Comment) Care giver support system in place?: Yes (comment) Current home services: DME Criminal Activity/Legal Involvement Pertinent to Current Situation/Hospitalization: No  - Comment as needed  Activities of Daily Living Home Assistive Devices/Equipment: Oxygen, Eyeglasses, Walker (specify type), Dentures (specify type), CBG Meter, Blood pressure cuff, Cane (specify quad or straight), Shower chair without back, Grab bars in shower ADL Screening (condition at time of admission) Patient's cognitive ability adequate to safely complete daily activities?: Yes Is the patient deaf or have difficulty hearing?: No Does the patient have difficulty seeing, even when wearing glasses/contacts?: No Does the patient have difficulty concentrating, remembering, or making decisions?: No Patient able to express need for assistance with ADLs?: Yes Does the patient have difficulty dressing or bathing?: No Independently performs ADLs?: Yes (appropriate for developmental age) Does the patient have difficulty walking or climbing stairs?: Yes Weakness of Legs: None Weakness of Arms/Hands: None  Permission Sought/Granted                  Emotional Assessment       Orientation: : Oriented to Self, Oriented to Place, Oriented to  Time, Oriented to Situation Alcohol / Substance Use: Not Applicable Psych Involvement: No (comment)  Admission diagnosis:  Paroxysmal atrial fibrillation (HCC) [I48.0] Left leg cellulitis [L03.116] Cellulitis of left lower leg [L03.116] Type 2 diabetes mellitus without complication, without long-term current use of insulin (HCC) [E11.9] Chronic obstructive pulmonary disease, unspecified COPD type ([place]) [J44.9] Chronic congestive heart failure, unspecified heart failure type ([place]) [I50.9] Cellulitis of left leg [  Z61.096] Patient Active Problem List   Diagnosis Date Noted  . Acute on chronic diastolic CHF (congestive heart failure) ([place]) 09/29/2020  . Cellulitis of left leg 09/28/2020  . Rash and nonspecific skin eruption 09/28/2020  . Left leg cellulitis 09/28/2020  . Normocytic anemia 06/11/2020  . COPD with acute exacerbation ([place])  06/11/2020  . Chronic diarrhea of unknown origin   . Acute left flank pain 01/15/2020  . Hypokalemia 11/14/2019  . Proteinuria 08/09/2019  . Acute on chronic congestive heart failure ([place]) 07/09/2019  . Nonrheumatic mitral valve regurgitation 07/08/2019  . Other persistent atrial fibrillation ([place]) 07/08/2019  . C. difficile diarrhea 11/24/2018  . Diarrhea 11/18/2018  . Nausea without vomiting 11/18/2018  . Goals of care, counseling/discussion 09/12/2018  . 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 History of repair of right rotator cuff 08/25/2016  . Acute respiratory failure with hypoxia ([place]) 08/06/2016  . Nontraumatic tear of right rotator cuff 04/21/2016  . Atypical chest pain 04/07/2016  . Impingement syndrome of right shoulder 12/29/2015  . Primary osteoarthritis of first carpometacarpal joint of left hand 10/27/2015  . Right shoulder pain 10/27/2015  . OSA on CPAP 10/14/2015  . COPD with asthma ([place]) 07/13/2015  . Type 2 diabetes mellitus with microalbuminuria, without long-term current use of insulin ([place]) 07/13/2015  . Essential hypertension 03/05/2015  . Difficulty sleeping 11/26/2014  . Chronic kidney disease, stage 3a 07/31/2014  . Seizure disorder ([place]) 07/31/2014  . History of colonic polyps 05/21/2014  . Hyperlipidemia, unspecified 04/30/2014   PCP:  Sofie Hartigan, MD Pharmacy:   [facility] [place], [place] [facility] [address] [place] [address] Phone: [phone] Fax: [phone]     Social Determinants of Health (SDOH) Interventions    Readmission Risk Interventions Readmission Risk Prevention Plan 09/30/2020 06/03/2019  Transportation Screening Complete Complete  PCP or Specialist Appt within 5-7 Days - Complete  Home Care Screening - Complete  Medication Review (RN CM) - Complete  HRI or Home Care Consult Complete -  Palliative Care Screening Not Applicable -  Medication  Review (RN Care Manager) Complete -  Some recent data might be hidden

## 2020-09-30 NOTE — Consult Note (Signed)
[facility] Nurse Consult Note: Reason for Consult: CHF exacerbation.  Edema to bilateral lower legs and cellulitis to left lower leg.  Wound type: infectious Pressure Injury POA: NA Measurement:Scattered nonintact lesions to left leg and dorsal foot.  Edema noted to bilateral lower legs Scattered 1 cm round partial thickness lesions to upper posterior trunk/back Wound bed: ruddy red Drainage (amount, consistency, odor)moderate serosanguinous  No odor.   Periwound:edema, erythema and tenderness Dressing procedure/placement/frequency: Cleanse bilateral lower legs with soap and water and pat dry.  Apply Xerofrom gauze to open wounds cover with dry dressing (back) and dry gauze, kerlix to legs.  Ace wrap to both legs to manage edema. Change daily.  Will not follow at this time.  Please re-consult if needed.  Domenic Moras MSN, RN, FNP-BC CWON Wound, Ostomy, Continence Nurse Pager [phone]

## 2020-09-30 NOTE — Evaluation (Signed)
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  Admitted for management of L LE cellulitis, acute exacerbation of CHF.  Clinical Impression  Patient resting in bed upon arrival to room; husband at bedside.  Alert and oriented to basic information; follows commands and agreeable to session.  Does endorse pain, 6/10, in L > R LEs; meds requested and administered per RN during session.  L LE noted to be grossly edematous (moreso than R); bilat LE wrapped in ace-wrap mid-calf distally.  Generally weak and deconditioned throughout bilat LE extremities, endorsing soreness in L knee with movement.  Currently requiring min/mod assist for bed mobility; mod assist for sit/stand, basic transfers and short-distance gait (5') with RW.  Demonstrates 3-point, step to gait pattern; very limited weight acceptance/loading to L LE. Heavy WBing bilat UEs; unsafe/unable to complete without use of RW. Mod SOB with minimal exertion; sats 83-84% on 3L with gait/transfers, requiring 1-2 min seated rest and pursed lip breathing for recovery >90%. Unable to tolerate additional activity at this time due to pain, fatigue; will continue to assess/progress in subsequent sessions as able. Would benefit from skilled PT to address above deficits and promote optimal return to PLOF.; recommend transition to STR upon discharge from acute hospitalization.     Follow Up Recommendations SNF    Equipment Recommendations       Recommendations for Other Services       Precautions / Restrictions Precautions Precautions: Fall Restrictions Weight Bearing Restrictions: No      Mobility  Bed  Mobility Overal bed mobility: Needs Assistance Bed Mobility: Supine to Sit;Sit to Supine     Supine to sit: Min assist Sit to supine: Min assist;Mod assist   General bed mobility comments: assist for LE management    Transfers Overall transfer level: Needs assistance Equipment used: Rolling walker (2 wheeled) Transfers: Sit to/from Stand Sit to Stand: Mod assist         General transfer comment: requires elevated seating surface and consistent cuing for R foot placement  Ambulation/Gait Ambulation/Gait assistance: Min assist;Mod assist Gait Distance (Feet): 5 Feet Assistive device: Rolling walker (2 wheeled)       General Gait Details: 3-point, step to gait pattern; very limited weight acceptance/loading to L LE. Heavy WBing bilat UEs; unsafe/unable to complete without use of RW. Mod SOB with minimal exertion; sats 83-84% on 3L with gait/transfers, requiring 1-2 min seated rest and pursed lip breathing for recovery >90%  Stairs            Wheelchair Mobility    Modified Rankin (Stroke Patients Only)       Balance Overall balance assessment: Needs assistance Sitting-balance support: No upper extremity supported;Feet supported Sitting balance-Leahy Scale: Good     Standing balance support: Bilateral upper extremity supported Standing balance-Leahy Scale: Poor Standing balance comment: UE support with RW                             Pertinent Vitals/Pain Pain Assessment: 0-10 Pain Score: 6  Faces Pain Scale: Hurts even more Pain Location: bilat LEs Pain Descriptors / Indicators: Tender;Sore  Pain Intervention(s): Limited activity within patient's tolerance;Monitored during session;Repositioned;Patient requesting pain meds-RN notified;RN gave pain meds during session    [place] expects to be discharged to:: Private residence Living Arrangements: Spouse/significant other Available Help at Discharge: Family;Available 24  hours/day Type of Home: House Home Access: Stairs to enter Entrance Stairs-Rails: Right Entrance Stairs-Number of Steps: 2 Home Layout: One level Home Equipment: Walker - 2 wheels;Walker - 4 wheels;Shower seat Additional Comments: on 2Lnc chronically    Prior Function Level of Independence: Needs assistance   Gait / Transfers Assistance Needed: typically uses 2WW at baseline  ADL's / Homemaking Assistance Needed: able to perform BADLs I'ly, but requires assist for IADLs, states her husband drives and gets groceries and has a person that assists with cleaning 2x/month.  Comments: Recent use of RW for mobility due to progressive weakness, swelling and pain in bilat LEs.  Home O2 at 2L     Hand Dominance        Extremity/Trunk Assessment   Upper Extremity Assessment Upper Extremity Assessment: Overall WFL for tasks assessed (grossly at least 4/5 throughout)    Lower Extremity Assessment Lower Extremity Assessment: Generalized weakness (grossly 3+ to 4-/5 throughout; significant generalized edema L LE.  Mid-calf distally with ace wrap, dressings for wound care) LLE Deficits / Details: not formally assessed d/t open wounds       Communication   Communication: No difficulties  Cognition Arousal/Alertness: Awake/alert Behavior During Therapy: WFL for tasks assessed/performed Overall Cognitive Status: Within Functional Limits for tasks assessed                                        General Comments      Exercises Other Exercises Other Exercises: Educated in role of PT and progressive mobility; initiated cuing/education for transfer techniques, use of assist device and overall safety needs.  Do recommend continued use of RW for all activiites at this time. Patient/husband voiced understanding and agreement. Other Exercises: Toilet transfer, SPT with RW, min/mod assist for sit/stand from [facility], min/mod assist for standing balance with RW.  Very short, deliberate  steps; limited balance reactions noted during transfer   Assessment/Plan    PT Assessment Patient needs continued PT services  PT Problem List Decreased strength;Decreased range of motion;Decreased activity tolerance;Decreased balance;Decreased mobility;Decreased knowledge of use of DME;Decreased safety awareness;Decreased knowledge of precautions;Decreased skin integrity;Pain       PT Treatment Interventions DME instruction;Gait training;Functional mobility training;Therapeutic activities;Therapeutic exercise;Balance training;Patient/family education    PT Goals (Current goals can be found in the Care Plan section)  Acute Rehab PT Goals Patient Stated Goal: to go home PT Goal Formulation: With patient Time For Goal Achievement: 10/14/20 Potential to Achieve Goals: Good    Frequency Min 2X/week   Barriers to discharge        Co-evaluation               AM-PAC PT "6 Clicks" Mobility  Outcome Measure Help needed turning from your back to your side while in a flat bed without using bedrails?: A Little Help needed moving from lying on your back to sitting on the side of a flat bed without using bedrails?: A Lot Help needed moving to and from a bed to a chair (including a wheelchair)?: A Lot Help needed standing up from a chair using your arms (e.g., wheelchair or bedside chair)?: A Lot Help needed to walk  in hospital room?: A Lot Help needed climbing 3-5 steps with a railing? : A Lot 6 Click Score: 13    End of Session Equipment Utilized During Treatment: Gait belt Activity Tolerance: Patient limited by pain Patient left: in bed;with call bell/phone within reach;with bed alarm set;with family/visitor present Nurse Communication: Mobility status PT Visit Diagnosis: Muscle weakness (generalized) (M62.81);Difficulty in walking, not elsewhere classified (R26.2);Pain Pain - Right/Left: Left Pain - part of body: Leg    Time: 3762-8315 PT Time Calculation (min) (ACUTE ONLY):  41 min   Charges:   PT Evaluation $PT Eval Moderate Complexity: 1 Mod PT Treatments $Therapeutic Activity: 8-22 mins       Kadience Macchi H. Owens Shark, PT, DPT, NCS 09/30/20, 4:19 PM [phone]

## 2020-09-30 NOTE — Progress Notes (Signed)
PROGRESS NOTE    Angelica Juarez  OIZ:[id number] DOB: 05/14/1941 DOA: 09/28/2020 PCP: Sofie Hartigan, MD   Assessment & Plan:   Principal Problem:   Cellulitis of left leg Active Problems:   COPD with asthma ([place])   Essential hypertension   Seizure disorder ([place])   Type 2 diabetes mellitus with microalbuminuria, without long-term current use of insulin (HCC)   Rash and nonspecific skin eruption   Acute on chronic diastolic CHF (congestive heart failure) (HCC)   Left leg cellulitis: continue on IV cefepime, vanco   Leukocytosis: secondary to infection. Continue on IV abxs   Thrombocytosis: etiology unclear. Will continue to monitor  AKI: Cr is trending up from day prior. Will continue to monitor   Acute on chronic diastolic CHF: continue on IV lasix. Strict I/Os and daily weights. Echo on 06/12/2020 showed EF estimated at 50 to 55%, normal LV diastolic parameters, mild to moderate mitral regurgitation, mild to moderate TR. Keep MAP >65  COPD with asthma: w/o exacerbation. Continue on bronchodilators and encourage incentive spirometry   Chronic hypoxic respiratory failure: continue on supplemental oxygen, currently 3L [place] which is pt's baseline   Seizure disorder: continue on keppra, lamotrigine  DVT prophylaxis: eliquis  Code Status: full  Family Communication: Disposition Plan: depends on PT/OT recs   Status is: Inpatient  Remains inpatient appropriate because:Ongoing diagnostic testing needed not appropriate for outpatient work up, Unsafe d/c plan and IV treatments appropriate due to intensity of illness or inability to take PO   Dispo: The patient is from: Home              Anticipated d/c is to:  Home health vs SNF               Anticipated d/c date is: 3 days              Patient currently is not medically stable to d/c.     Consultants:      Procedures:    Antimicrobials: cefepime, vanco    Subjective: Pt c/o b/l leg  swelling  Objective: Vitals:   09/29/20 1932 09/29/20 2310 09/30/20 0443 09/30/20 0500  BP: (!) 99/47 (!) 105/58 (!) 93/58   Pulse: 97 100 96   Resp: 20 20    Temp: 97.9 F (36.6 C) 98.8 F (37.1 C) 97.8 F (36.6 C)   TempSrc: Oral Oral Oral   SpO2: 91% 90% 92%   Weight:    104.4 kg  Height:        Intake/Output Summary (Last 24 hours) at 09/30/2020 0828 Last data filed at 09/30/2020 0600 Gross per 24 hour  Intake 818.48 ml  Output 600 ml  Net 218.48 ml   Filed Weights   09/28/20 1522 09/30/20 0500  Weight: 86.2 kg 104.4 kg    Examination:  General exam: Appears calm and comfortable  Respiratory system: Clear to auscultation. Respiratory effort normal. Cardiovascular system: S1 & S2 +. No rubs, gallops or clicks. No pedal edema. Gastrointestinal system: Abdomen is nondistended, soft and nontender. Normal bowel sounds heard. Central nervous system: Alert and oriented. Moves all 4 extremities  Psychiatry: Judgement and insight appear normal. Flat mood and affect Skin:  B/l LE tenderness to palpation, edematous & erythema L>R. Scaling dry skin of b/l LE     Data Reviewed: I have personally reviewed following labs and imaging studies  CBC: Recent Labs  Lab 09/28/20 1525 09/28/20 2149 09/30/20 0557  WBC 13.7* 14.4* 16.3*  NEUTROABS 11.6*  --  13.6*  HGB 11.3* 11.3* 11.1*  HCT 36.2 36.5 36.6  MCV 90.5 90.6 92.0  PLT 545* 571* 350*   Basic Metabolic Panel: Recent Labs  Lab 09/28/20 1530 09/28/20 2149 09/30/20 0557  NA 139  --  139  K 3.6  --  3.6  CL 99  --  99  CO2 28  --  28  GLUCOSE 135*  --  146*  BUN 16  --  25*  CREATININE 0.93 0.92 1.67*  CALCIUM 9.8  --  9.4  MG  --   --  1.8   GFR: Estimated Creatinine Clearance: 35 mL/min (A) (by C-G formula based on SCr of 1.67 mg/dL (H)). Liver Function Tests: No results for input(s): AST, ALT, ALKPHOS, BILITOT, PROT, ALBUMIN in the last 168 hours. No results for input(s): LIPASE, AMYLASE in the last  168 hours. No results for input(s): AMMONIA in the last 168 hours. Coagulation Profile: No results for input(s): INR, PROTIME in the last 168 hours. Cardiac Enzymes: No results for input(s): CKTOTAL, CKMB, CKMBINDEX, TROPONINI in the last 168 hours. BNP (last 3 results) No results for input(s): PROBNP in the last 8760 hours. HbA1C: Recent Labs    09/28/20 2149  HGBA1C 5.7*   CBG: Recent Labs  Lab 09/29/20 0828 09/29/20 1149 09/29/20 1629 09/29/20 2110 09/30/20 0742  GLUCAP 119* 149* 132* 174* 112*   Lipid Profile: No results for input(s): CHOL, HDL, LDLCALC, TRIG, CHOLHDL, LDLDIRECT in the last 72 hours. Thyroid Function Tests: No results for input(s): TSH, T4TOTAL, FREET4, T3FREE, THYROIDAB in the last 72 hours. Anemia Panel: No results for input(s): VITAMINB12, FOLATE, FERRITIN, TIBC, IRON, RETICCTPCT in the last 72 hours. Sepsis Labs: Recent Labs  Lab 09/28/20 1525 09/28/20 2149  LATICACIDVEN 1.2 1.6    Recent Results (from the past 240 hour(s))  Culture, blood (Routine x 2)     Status: None (Preliminary result)   Collection Time: 09/28/20  3:29 PM   Specimen: BLOOD  Result Value Ref Range Status   Specimen Description BLOOD RIGHT ANTECUBITAL  Final   Special Requests   Final    BOTTLES DRAWN AEROBIC AND ANAEROBIC Blood Culture adequate volume   Culture   Final    NO GROWTH 2 DAYS Performed at [facility], [address], [place], [place] [address]    Report Status PENDING  Incomplete  Culture, blood (Routine x 2)     Status: None (Preliminary result)   Collection Time: 09/28/20  5:47 PM   Specimen: BLOOD  Result Value Ref Range Status   Specimen Description BLOOD LEFT ANTECUBITAL  Final   Special Requests   Final    BOTTLES DRAWN AEROBIC AND ANAEROBIC Blood Culture results may not be optimal due to an excessive volume of blood received in culture bottles   Culture   Final    NO GROWTH 2 DAYS Performed at [facility], [address], [place], [place] [address]    Report Status PENDING  Incomplete  Respiratory Panel by RT PCR (Flu A&B, Covid) - Nasopharyngeal Swab     Status: None   Collection Time: 09/28/20  6:37 PM   Specimen: Nasopharyngeal Swab  Result Value Ref Range Status   SARS Coronavirus 2 by RT PCR NEGATIVE NEGATIVE Final    Comment: (NOTE) SARS-CoV-2 target nucleic acids are NOT DETECTED.  The SARS-CoV-2 RNA is generally detectable in upper respiratoy specimens during the acute phase of infection. The lowest concentration of SARS-CoV-2 viral copies this assay can  detect is 131 copies/mL. A negative result does not preclude SARS-Cov-2 infection and should not be used as the sole basis for treatment or other patient management decisions. A negative result may occur with  improper specimen collection/handling, submission of specimen other than nasopharyngeal swab, presence of viral mutation(s) within the areas targeted by this assay, and inadequate number of viral copies (<131 copies/mL). A negative result must be combined with clinical observations, patient history, and epidemiological information. The expected result is Negative.  Fact Sheet for Patients:  PinkCheek.be  Fact Sheet for Healthcare Providers:  GravelBags.it  This test is no t yet approved or cleared by the [place] FDA and  has been authorized for detection and/or diagnosis of SARS-CoV-2 by FDA under an Emergency Use Authorization (EUA). This EUA will remain  in effect (meaning this test can be used) for the duration of the COVID-19 declaration under Section 564(b)(1) of the Act, 21 U.S.C. section 360bbb-3(b)(1), unless the authorization is terminated or revoked sooner.     Influenza A by PCR NEGATIVE NEGATIVE Final   Influenza B by PCR NEGATIVE NEGATIVE Final    Comment: (NOTE) The Xpert Xpress SARS-CoV-2/FLU/RSV assay is intended as an aid in  the diagnosis of  influenza from Nasopharyngeal swab specimens and  should not be used as a sole basis for treatment. Nasal washings and  aspirates are unacceptable for Xpert Xpress SARS-CoV-2/FLU/RSV  testing.  Fact Sheet for Patients: PinkCheek.be  Fact Sheet for Healthcare Providers: GravelBags.it  This test is not yet approved or cleared by the [place] FDA and  has been authorized for detection and/or diagnosis of SARS-CoV-2 by  FDA under an Emergency Use Authorization (EUA). This EUA will remain  in effect (meaning this test can be used) for the duration of the  Covid-19 declaration under Section 564(b)(1) of the Act, 21  U.S.C. section 360bbb-3(b)(1), unless the authorization is  terminated or revoked. Performed at [facility], [address] [place], [place] [address]   Aerobic Culture (superficial specimen)     Status: None (Preliminary result)   Collection Time: 09/28/20 11:22 PM   Specimen: SHOULDER  Result Value Ref Range Status   Specimen Description   Final    SHOULDER LEFT Performed at [facility], [place] [address], [place], [place] [address]    Special Requests   Final    NONE Performed at [facility], [address] [place], [place] [address]    Gram Stain   Final    NO WBC SEEN NO ORGANISMS SEEN Performed at [facility], [place] [address] [place], [place] [address]    Culture PENDING  Incomplete   Report Status PENDING  Incomplete  Aerobic Culture (superficial specimen)     Status: None (Preliminary result)   Collection Time: 09/28/20 11:22 PM   Specimen: Leg  Result Value Ref Range Status   Specimen Description   Final    LEG LEFT Performed at [facility], [address], [place], [place] [address]    Special Requests   Final    NONE Performed at [facility], [address] [place], [place] [address]    Gram Stain   Final    NO WBC SEEN FEW  GRAM POSITIVE RODS Performed at [facility], [place] [address], [place], [place] [address]    Culture PENDING  Incomplete   Report Status PENDING  Incomplete         Radiology Studies: DG Chest  2 View  Result Date: 09/28/2020 CLINICAL DATA:  79 year old female with possible foot infection. EXAM: CHEST - 2 VIEW COMPARISON:  Chest radiograph dated 06/22/2020. FINDINGS: There is mild cardiomegaly with mild vascular congestion. Small left pleural effusion and left lung base atelectasis or infiltrate. Trace right pleural effusion may be present. No pneumothorax. Osteopenia with degenerative changes of the spine. No acute osseous pathology. IMPRESSION: 1. Cardiomegaly with mild vascular congestion. 2. Small left pleural effusion and left lung base atelectasis or infiltrate. Electronically Signed   By: Anner Crete M.D.   On: 09/28/2020 16:07   US Venous Img Lower Bilateral  Result Date: 09/28/2020 CLINICAL DATA:  79 year old female with bilateral lower extremity edema. EXAM: Bilateral LOWER EXTREMITY VENOUS DOPPLER ULTRASOUND TECHNIQUE: Gray-scale sonography with compression, as well as color and duplex ultrasound, were performed to evaluate the deep venous system(s) from the level of the common femoral vein through the popliteal and proximal calf veins. COMPARISON:  None. FINDINGS: VENOUS Normal compressibility of the common femoral, superficial femoral, and popliteal veins, as well as the visualized calf veins. Visualized portions of profunda femoral vein and great saphenous vein unremarkable. No filling defects to suggest DVT on grayscale or color Doppler imaging. Doppler waveforms show normal direction of venous flow, normal respiratory plasticity and response to augmentation. Limited views of the contralateral common femoral vein are unremarkable. OTHER None. Limitations: There is mild diffuse subcutaneous edema. No fluid collection. IMPRESSION: Negative. Electronically Signed   By: Anner Crete M.D.   On: 09/28/2020 18:40        Scheduled Meds: . apixaban  5 mg Oral BID  . vitamin C  500 mg Oral BID  . aspirin EC  81 mg Oral Daily  . atorvastatin  40 mg Oral Daily  . B-complex with vitamin C   Oral Daily  . fluticasone  2 spray Each Nare Daily  . furosemide  20 mg Oral BID  . hydrochlorothiazide  25 mg Oral Daily  . hydroxyurea  500 mg Oral Once per day on Sun Tue Wed Thu Fri Sat  . [START ON 10/05/2020] hydroxyurea  500 mg Oral 2 times per day every 7 days  . influenza vaccine adjuvanted  0.5 mL Intramuscular Tomorrow-1000  . insulin aspart  0-15 Units Subcutaneous TID WC  . irbesartan  300 mg Oral Daily  . [START ON 10/05/2020] lamoTRIgine  100 mg Oral BID   Followed by  . lamoTRIgine  100 mg Oral BID   Followed by  . [START ON 10/12/2020] lamoTRIgine  125 mg Oral BID   Followed by  . [START ON 10/19/2020] lamoTRIgine  125 mg Oral BID  . [START ON 10/26/2020] lamoTRIgine  150 mg Oral BID  . [START ON 10/05/2020] lamoTRIgine  25 mg Oral QHS  . [START ON 10/19/2020] lamoTRIgine  25 mg Oral QHS  . levETIRAcetam  500 mg Oral BID  . montelukast  10 mg Oral Daily  . NIFEdipine  30 mg Oral Daily  . traZODone  50 mg Oral QHS  . umeclidinium-vilanterol  1 puff Inhalation Daily   Continuous Infusions: . sodium chloride 10 mL/hr at 09/30/20 0600  . ceFEPime (MAXIPIME) IV Stopped (09/29/20 2338)  . vancomycin Stopped (09/29/20 2242)     LOS: 1 day    Time spent: 33 mins     Wyvonnia Dusky, MD [facility] Pager [phone]  If 7PM-7AM, please contact night-coverage 09/30/2020, 8:28 AM

## 2020-10-01 DIAGNOSIS — L03116 Cellulitis of left lower limb: Secondary | ICD-10-CM | POA: Diagnosis not present

## 2020-10-01 DIAGNOSIS — I5033 Acute on chronic diastolic (congestive) heart failure: Secondary | ICD-10-CM | POA: Diagnosis not present

## 2020-10-01 DIAGNOSIS — N179 Acute kidney failure, unspecified: Secondary | ICD-10-CM | POA: Diagnosis not present

## 2020-10-01 LAB — BASIC METABOLIC PANEL
Anion gap: 13 (ref 5–15)
BUN: 35 mg/dL — ABNORMAL HIGH (ref 8–23)
CO2: 25 mmol/L (ref 22–32)
Calcium: 9 mg/dL (ref 8.9–10.3)
Chloride: 99 mmol/L (ref 98–111)
Creatinine, Ser: 2.49 mg/dL — ABNORMAL HIGH (ref 0.44–1.00)
GFR, Estimated: 19 mL/min — ABNORMAL LOW (ref >60)
Glucose, Bld: 126 mg/dL — ABNORMAL HIGH (ref 70–99)
Potassium: 3.6 mmol/L (ref 3.5–5.1)
Sodium: 137 mmol/L (ref 135–145)

## 2020-10-01 LAB — CBC
HCT: 34.5 % — ABNORMAL LOW (ref 36.0–46.0)
Hemoglobin: 10.7 g/dL — ABNORMAL LOW (ref 12.0–15.0)
MCH: 28.2 pg (ref 26.0–34.0)
MCHC: 31 g/dL (ref 30.0–36.0)
MCV: 91 fL (ref 80.0–100.0)
Platelets: 543 10*3/uL — ABNORMAL HIGH (ref 150–400)
RBC: 3.79 MIL/uL — ABNORMAL LOW (ref 3.87–5.11)
RDW: 15.6 % — ABNORMAL HIGH (ref 11.5–15.5)
WBC: 17.5 10*3/uL — ABNORMAL HIGH (ref 4.0–10.5)
nRBC: 0 % (ref 0.0–0.2)

## 2020-10-01 LAB — GLUCOSE, CAPILLARY
Glucose-Capillary: 115 mg/dL — ABNORMAL HIGH (ref 70–99)
Glucose-Capillary: 127 mg/dL — ABNORMAL HIGH (ref 70–99)
Glucose-Capillary: 119 mg/dL — ABNORMAL HIGH (ref 70–99)
Glucose-Capillary: 125 mg/dL — ABNORMAL HIGH (ref 70–99)
Glucose-Capillary: 116 mg/dL — ABNORMAL HIGH (ref 70–99)

## 2020-10-01 LAB — VANCOMYCIN, TROUGH: Vancomycin Tr: 16 ug/mL (ref 15–20)

## 2020-10-01 LAB — CREATININE, SERUM
Creatinine, Ser: 2.34 mg/dL — ABNORMAL HIGH (ref 0.44–1.00)
GFR, Estimated: 21 mL/min — ABNORMAL LOW (ref >60)

## 2020-10-01 LAB — RESP PANEL BY RT-PCR (FLU A&B, COVID) ARPGX2
Influenza A by PCR: NEGATIVE
Influenza B by PCR: NEGATIVE
SARS Coronavirus 2 by RT PCR: NEGATIVE

## 2020-10-01 MED ORDER — BISACODYL 5 MG PO TBEC
5.0000 mg | DELAYED_RELEASE_TABLET | Freq: Every day | ORAL | Status: DC | PRN
  Administered 2020-10-04: 5 mg via ORAL
  Filled 2020-10-01: qty 1

## 2020-10-01 MED ORDER — SODIUM CHLORIDE 0.9 % IV SOLN
2.0000 g | INTRAVENOUS | Status: DC
  Administered 2020-10-01: 2 g via INTRAVENOUS
  Filled 2020-10-01 (×2): qty 2

## 2020-10-01 MED ORDER — MIDODRINE HCL 5 MG PO TABS
10.0000 mg | ORAL_TABLET | Freq: Three times a day (TID) | ORAL | Status: DC
  Administered 2020-10-01 – 2020-10-05 (×12): 10 mg via ORAL
  Filled 2020-10-01 (×17): qty 2

## 2020-10-01 MED ORDER — DOCUSATE SODIUM 100 MG PO CAPS
200.0000 mg | ORAL_CAPSULE | Freq: Two times a day (BID) | ORAL | Status: DC
  Administered 2020-10-01 – 2020-10-06 (×11): 200 mg via ORAL
  Filled 2020-10-01 (×10): qty 2

## 2020-10-01 NOTE — NC FL2 (Signed)
[place] LEVEL OF CARE SCREENING TOOL     IDENTIFICATION  Patient Name: Angelica Juarez Birthdate: [birth year] Sex: female Admission Date (Current Location): 09/28/2020  [facility] [place] Number:      Facility and Address:         Provider Number: [id number]  Attending Physician Name and Address:  Wyvonnia Dusky, MD  Relative Name and Phone Number:       Current Level of Care: Hospital Recommended Level of Care: [place] Prior Approval Number:    Date Approved/Denied:   PASRR Number: [id number]  Discharge Plan: SNF    Current Diagnoses: Patient Active Problem List   Diagnosis Date Noted  . Acute on chronic diastolic CHF (congestive heart failure) ([place]) 09/29/2020  . Cellulitis of left leg 09/28/2020  . Rash and nonspecific skin eruption 09/28/2020  . Left leg cellulitis 09/28/2020  . Normocytic anemia 06/11/2020  . COPD with acute exacerbation ([place]) 06/11/2020  . Chronic diarrhea of unknown origin   . Acute left flank pain 01/15/2020  . Hypokalemia 11/14/2019  . Proteinuria 08/09/2019  . Acute on chronic congestive heart failure ([place]) 07/09/2019  . Nonrheumatic mitral valve regurgitation 07/08/2019  . Other persistent atrial fibrillation ([place]) 07/08/2019  . C. difficile diarrhea 11/24/2018  . Diarrhea 11/18/2018  . Nausea without vomiting 11/18/2018  . Goals of care, counseling/discussion 09/12/2018  . 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 History of repair of right rotator cuff 08/25/2016  . Acute respiratory failure with hypoxia ([place]) 08/06/2016  . Nontraumatic tear of right rotator cuff 04/21/2016  . Atypical chest pain 04/07/2016  . Impingement syndrome of right shoulder 12/29/2015  . Primary osteoarthritis of first carpometacarpal joint of left hand 10/27/2015  . Right shoulder pain 10/27/2015  . OSA on CPAP 10/14/2015  . COPD with asthma ([place]) 07/13/2015  . Type 2 diabetes mellitus with microalbuminuria,  without long-term current use of insulin ([place]) 07/13/2015  . Essential hypertension 03/05/2015  . Difficulty sleeping 11/26/2014  . Chronic kidney disease, stage 3a 07/31/2014  . Seizure disorder ([place]) 07/31/2014  . History of colonic polyps 05/21/2014  . Hyperlipidemia, unspecified 04/30/2014    Orientation RESPIRATION BLADDER Height & Weight     Time, Self, Situation, Place  O2 (3L [place]) Continent Weight: 104.8 kg Height:  5\' 7"  (170.2 cm)  BEHAVIORAL SYMPTOMS/MOOD NEUROLOGICAL BOWEL NUTRITION STATUS      Continent Diet (Heart Healthy Carb modified)  AMBULATORY STATUS COMMUNICATION OF NEEDS Skin   Extensive Assist Verbally Other (Comment) (cellulitis, rash)                       Personal Care Assistance Level of Assistance              Functional Limitations Info             SPECIAL CARE FACTORS FREQUENCY  PT (By licensed PT), OT (By licensed OT)                    Contractures Contractures Info: Not present    Additional Factors Info  Code Status, Allergies Code Status Info: Full Allergies Info: Iodinated Diagnostic Agents, Latex, Phenobarbital, Tape           Current Medications (10/01/2020):  This is the current hospital active medication list Current Facility-Administered Medications  Medication Dose Route Frequency Provider Last Rate Last Admin  . 0.9 %  sodium chloride infusion   Intravenous PRN Para Skeans, MD  5 mL/hr at 09/30/20 2045 250 mL at 09/30/20 2045  . acetaminophen (TYLENOL) tablet 650 mg  650 mg Oral Q6H PRN Jennye Boroughs, MD   650 mg at 10/01/20 0325  . albuterol (PROVENTIL) (2.5 MG/3ML) 0.083% nebulizer solution 2.5 mg  2.5 mg Nebulization Q6H PRN Para Skeans, MD   2.5 mg at 10/01/20 0026  . apixaban (ELIQUIS) tablet 5 mg  5 mg Oral BID Para Skeans, MD   5 mg at 09/30/20 2035  . ascorbic acid (VITAMIN C) tablet 500 mg  500 mg Oral BID Para Skeans, MD   500 mg at 09/30/20 2035  . aspirin EC tablet 81 mg  81 mg Oral Daily  Para Skeans, MD   81 mg at 09/30/20 1005  . atorvastatin (LIPITOR) tablet 40 mg  40 mg Oral Daily Para Skeans, MD   40 mg at 09/30/20 2035  . B-complex with vitamin C tablet   Oral Daily Para Skeans, MD   Given at 09/30/20 1008  . ceFEPIme (MAXIPIME) 2 g in sodium chloride 0.9 % 100 mL IVPB  2 g Intravenous Q24H Lorna Dibble, RPH      . fluticasone (FLONASE) 50 MCG/ACT nasal spray 2 spray  2 spray Each Nare Daily Oswald Hillock, RPH   2 spray at 09/30/20 1006  . hydroxyurea (HYDREA) capsule 500 mg  500 mg Oral Once per day on Sun Tue Wed Thu Fri Sat Dallie Piles, RPH   500 mg at 09/30/20 1009  . [START ON 10/05/2020] hydroxyurea (HYDREA) capsule 500 mg  500 mg Oral 2 times per day every 7 days Dallie Piles, [facility]      . influenza vaccine adjuvanted (FLUAD) injection 0.5 mL  0.5 mL Intramuscular Tomorrow-1000 Florina Ou V, MD      . insulin aspart (novoLOG) injection 0-15 Units  0-15 Units Subcutaneous TID WC Para Skeans, MD   2 Units at 09/30/20 1717  . [START ON 10/05/2020] lamoTRIgine (LAMICTAL) tablet 100 mg  100 mg Oral BID Dallie Piles, RPH       Followed by  . lamoTRIgine (LAMICTAL) tablet 100 mg  100 mg Oral BID Dallie Piles, RPH   100 mg at 09/30/20 2036   Followed by  . [START ON 10/12/2020] lamoTRIgine (LAMICTAL) tablet 125 mg  125 mg Oral BID Dallie Piles, RPH       Followed by  . [START ON 10/19/2020] lamoTRIgine (LAMICTAL) tablet 125 mg  125 mg Oral BID Dallie Piles, [facility]      . [START ON 10/26/2020] lamoTRIgine (LAMICTAL) tablet 150 mg  150 mg Oral BID Dallie Piles, [facility]      . [START ON 10/05/2020] lamoTRIgine (LAMICTAL) tablet 25 mg  25 mg Oral QHS Dallie Piles, [facility]      . [START ON 10/19/2020] lamoTRIgine (LAMICTAL) tablet 25 mg  25 mg Oral QHS Dallie Piles, RPH      . levETIRAcetam (KEPPRA) tablet 500 mg  500 mg Oral BID Para Skeans, MD   500 mg at 09/30/20 2036  . montelukast (SINGULAIR) tablet 10 mg  10 mg Oral Daily Para Skeans, MD   10  mg at 09/30/20 2035  . morphine 4 MG/ML injection 4 mg  4 mg Intravenous Q4H PRN Carrie Mew, MD   4 mg at 10/01/20 0615  . NIFEdipine (PROCARDIA-XL/NIFEDICAL-XL) 24 hr tablet 30 mg  30 mg Oral Daily Para Skeans,  MD   30 mg at 09/30/20 1004  . oxyCODONE (Oxy IR/ROXICODONE) immediate release tablet 5 mg  5 mg Oral Q8H PRN Jennye Boroughs, MD   5 mg at 10/01/20 0026  . sodium chloride flush (NS) 0.9 % injection 10-40 mL  10-40 mL Intracatheter Q12H Wyvonnia Dusky, MD   10 mL at 09/30/20 2356  . sodium chloride flush (NS) 0.9 % injection 10-40 mL  10-40 mL Intracatheter PRN Wyvonnia Dusky, MD      . traZODone (DESYREL) tablet 50 mg  50 mg Oral QHS Para Skeans, MD   50 mg at 09/30/20 2035  . umeclidinium-vilanterol (ANORO ELLIPTA) 62.5-25 MCG/INH 1 puff  1 puff Inhalation Daily Oswald Hillock, RPH   1 puff at 09/30/20 1006  . vancomycin (VANCOREADY) IVPB 1500 mg/300 mL  1,500 mg Intravenous Q48H Dallie Piles, [facility]         Discharge Medications: Please see discharge summary for a list of discharge medications.  Relevant Imaging Results:  Relevant Lab Results:   Additional Information ss# [id number]  Beverly Sessions, RN

## 2020-10-01 NOTE — TOC Progression Note (Signed)
Transition of Care [facility]) - Progression Note    Patient Details  Name: Angelica Juarez MRN: [id number] Date of Birth: 06-13-1941  Transition of Care [facility]) CM/SW Contact  Beverly Sessions, RN Phone Number: 10/01/2020, 2:22 PM  Clinical Narrative:     Bed offers presented.  Patient accepted bed at [facility] in [place].  Claiborne Billings at [employer] notified   MD to order repeat covid   Expected Discharge Plan: [place] Barriers to Discharge: Continued Medical Work up  Expected Discharge Plan and Services Expected Discharge Plan: [place]   Discharge Planning Services: CM Consult   Living arrangements for the past 2 months: [place]: RN [facility] Agency: [place] ([place]) Date [place]: 09/30/20   Representative spoke with at [place]: [place] ([place]) Interventions    Readmission Risk Interventions Readmission Risk Prevention Plan 09/30/2020 06/03/2019  Transportation Screening Complete Complete  PCP or Specialist Appt within 5-7 Days - Complete  Home Care Screening - Complete  Medication Review (RN CM) - Complete  HRI or Home Care Consult Complete -  Palliative Care Screening Not Applicable -  Medication Review (RN Care Manager) Complete -  Some recent data might be hidden

## 2020-10-01 NOTE — TOC Progression Note (Signed)
Transition of Care [facility]) - Progression Note    Patient Details  Name: Angelica Juarez MRN: [id number] Date of Birth: Feb 17, 1941  Transition of Care [facility]) CM/SW Contact  Beverly Sessions, RN Phone Number: 10/01/2020, 9:17 AM  Clinical Narrative:     PT has evaluated and recommends SNF.  Patient in agreement PASRR obtained. Fl2 sent for signature, bed search initiated   Expected Discharge Plan: [place] Barriers to Discharge: Continued Medical Work up  Expected Discharge Plan and Services Expected Discharge Plan: [place]   Discharge Planning Services: CM Consult   Living arrangements for the past 2 months: [place]: RN [facility] Agency: [place] ([facility]) Date [place]: 09/30/20   Representative spoke with at [place]: [place] ([place]) Interventions    Readmission Risk Interventions Readmission Risk Prevention Plan 09/30/2020 06/03/2019  Transportation Screening Complete Complete  PCP or Specialist Appt within 5-7 Days - Complete  Home Care Screening - Complete  Medication Review (RN CM) - Complete  HRI or Home Care Consult Complete -  Palliative Care Screening Not Applicable -  Medication Review (RN Care Manager) Complete -  Some recent data might be hidden

## 2020-10-01 NOTE — Progress Notes (Signed)
Mobility Specialist - Progress Note   10/01/20 1208  Mobility  Activity Transferred to/from [facility];Transferred:  Bed to chair  Level of Assistance Contact guard assist, steadying assist  Assistive Device Front wheel walker  Distance Ambulated (ft) 5 ft  Mobility Response Tolerated well  Mobility performed by Mobility specialist  $Mobility charge 1 Mobility    Pre-mobility: 94 HR, 90% SpO2 During mobility: 98 HR, 85% SpO2 Post-mobility: 91 HR, 91% SpO2   Pt on BSC w/ NT present in room upon arrival. Pt agreed to session. Pt states "I have a hard time using the bathroom". Pt only had an urinal output. Mobility specialist assisted w/ toilet hygiene. Pt able to S2S from [facility] to RW w/ CGA. Pt transferred from [facility] to recliner w/ CGA. VC required for sequencing and hand placement. O2 desat to 85% during session. Pt able to recover O2 after being seated and utilizing PLB for a couple of minutes. O2 sat up to 91% at the end of session. Pt on 3L O2 [place]. Pt left seated on the recliner w/ alarm set. All needs placed in reach.     Annis Lagoy Mobility Specialist  10/01/20, 12:13 PM

## 2020-10-01 NOTE — Consult Note (Signed)
Pharmacy Antibiotic Note  Angelica Juarez is a 79 y.o. female admitted on 09/28/2020 with left leg cellulitis.  Pharmacy was consulted for cefepime and vancomycin dosing. Since admission her renal function markedly worsened (creatinine: 0.92>1.67>2.49). This is day # 4 of broad-spectrum IV antibiotics. The most recent vancomycin dose was 09/29/20 at 2042 and vanc random level was 86mcg/ml (~34hr level). At current rate of worsening renal status, indication for cellulitis, and no isolated staph Aureus or GAS from wound ; anticipate 1500mg  q48h Vanc regimen to achieve trough in range of 10-5mcg/mL. Will CTM renal function trend and adjust in response.  Plan:  1) Adjusted to cefepime 2 grams IV every 24 hours   2) Adjusted vancomycin dose to 1500 mg IV every 48 hours  Ke: 0.024 h-1, T1/2: 28.5 h  Daily SCr while on vancomycin to assess renal function   Height: 5\' 7"  (170.2 cm) Weight: 104.8 kg (231 lb 0.7 oz) IBW/kg (Calculated) : 61.6  Temp (24hrs), Avg:98 F (36.7 C), Min:97.6 F (36.4 C), Max:98.5 F (36.9 C)  Recent Labs  Lab 09/28/20 1525 09/28/20 1530 09/28/20 2149 09/30/20 0557 10/01/20 0624 10/01/20 0841  WBC 13.7*  --  14.4* 16.3*  --  17.5*  CREATININE  --  0.93 0.92 1.67* 2.34* 2.49*  LATICACIDVEN 1.2  --  1.6  --   --   --   VANCOTROUGH  --   --   --   --  16  --     Antimicrobials this admission: 11/15 ceftriaxone x 1 11/15 cefepime >>  11/15 vancomycin >>   Microbiology results: 11/15 WCx (shoulder): NG (pending) 11/15 WCx (leg): Multi-org - no staph aureus / no group A strep 11/15 BCx: NG x 2 days 11/15 SARS CoV-2: negative 11/15 influenza A/B: negative  Thank you for allowing pharmacy to be a part of this patient's care.  Shanon Brow Angelica Juarez 10/01/2020 12:29 PM

## 2020-10-01 NOTE — Progress Notes (Signed)
PROGRESS NOTE    Angelica Juarez  OIN:[id number] DOB: [birth year] DOA: 09/28/2020 PCP: Sofie Hartigan, MD   Assessment & Plan:   Principal Problem:   Cellulitis of left leg Active Problems:   COPD with asthma ([place])   Essential hypertension   Seizure disorder ([place])   Type 2 diabetes mellitus with microalbuminuria, without long-term current use of insulin (HCC)   Rash and nonspecific skin eruption   Acute on chronic diastolic CHF (congestive heart failure) (HCC)   Left leg cellulitis: continue on IV cefepime, vanco. Will switch vanco to dapto or another po abx to cover MRSA if Cr continues to trend up tomorrow    Leukocytosis: secondary to infection. Continue on IV abxs  Thrombocytosis: etiology unclear. Will continue to monitor  AKI: Cr continues to trend up from day prior. Will hold home dose of ACE-I, lasix & HCTZ  Acute on chronic diastolic CHF: hold lasix secondary to AKI today & will re-evaluate tomorrow. Strict I/Os and daily weights. Echo on 06/12/2020 showed EF estimated at 50 to 55%, normal LV diastolic parameters, mild to moderate mitral regurgitation, mild to moderate TR.   Hypotension: etiology unclear. Will start midodrine   COPD with asthma: w/o exacerbation. Encourage incentive spirometry and continue on bronchodilators   Chronic hypoxic respiratory failure: continue on supplemental oxygen, currently 3L [place] which is pt's baseline   Seizure disorder: continue on keppra, lamotrigine   DVT prophylaxis: eliquis  Code Status: full  Family Communication: Disposition Plan: likely d/c to SNF   Status is: Inpatient  Remains inpatient appropriate because:Ongoing diagnostic testing needed not appropriate for outpatient work up, Unsafe d/c plan and IV treatments appropriate due to intensity of illness or inability to take PO   Dispo: The patient is from: Home              Anticipated d/c is to:  SNF               Anticipated d/c date is: 1 day or 2 days if Cr  improves & cellulitis improves              Patient currently is not medically stable to d/c.     Consultants:      Procedures:    Antimicrobials: cefepime, vanco    Subjective: Pt c/o knee pain   Objective: Vitals:   10/01/20 0416 10/01/20 0418 10/01/20 0947 10/01/20 1254  BP:   (!) 95/58 (!) 88/43  Pulse: 92  87 90  Resp:   (!) 22 20  Temp:   97.6 F (36.4 C) 97.8 F (36.6 C)  TempSrc:   Oral Oral  SpO2: 95%  92% 98%  Weight:  104.8 kg    Height:        Intake/Output Summary (Last 24 hours) at 10/01/2020 1415 Last data filed at 10/01/2020 0900 Gross per 24 hour  Intake 394.81 ml  Output --  Net 394.81 ml   Filed Weights   09/28/20 1522 09/30/20 0500 10/01/20 0418  Weight: 86.2 kg 104.4 kg 104.8 kg    Examination:  General exam: Appears calm & comfortable  Respiratory system: diminished breath sounds b/l. No rales  Cardiovascular system: S1/S2+. No rubs or gallops  Gastrointestinal system: Abdomen is nondistended, soft and nontender. Normal bowel sounds heard. Central nervous system: Alert and oriented. Moves all 4 extremities  Psychiatry: Judgement and insight appear normal. Flat mood and affect  Skin:  B/l LE tenderness to palpation, edematous & erythema L>R. Scaling dry skin  of b/l LE     Data Reviewed: I have personally reviewed following labs and imaging studies  CBC: Recent Labs  Lab 09/28/20 1525 09/28/20 2149 09/30/20 0557 10/01/20 0841  WBC 13.7* 14.4* 16.3* 17.5*  NEUTROABS 11.6*  --  13.6*  --   HGB 11.3* 11.3* 11.1* 10.7*  HCT 36.2 36.5 36.6 34.5*  MCV 90.5 90.6 92.0 91.0  PLT 545* 571* 531* 035*   Basic Metabolic Panel: Recent Labs  Lab 09/28/20 1530 09/28/20 2149 09/30/20 0557 10/01/20 0624 10/01/20 0841  NA 139  --  139  --  137  K 3.6  --  3.6  --  3.6  CL 99  --  99  --  99  CO2 28  --  28  --  25  GLUCOSE 135*  --  146*  --  126*  BUN 16  --  25*  --  35*  CREATININE 0.93 0.92 1.67* 2.34* 2.49*  CALCIUM 9.8   --  9.4  --  9.0  MG  --   --  1.8  --   --    GFR: Estimated Creatinine Clearance: 23.6 mL/min (A) (by C-G formula based on SCr of 2.49 mg/dL (H)). Liver Function Tests: No results for input(s): AST, ALT, ALKPHOS, BILITOT, PROT, ALBUMIN in the last 168 hours. No results for input(s): LIPASE, AMYLASE in the last 168 hours. No results for input(s): AMMONIA in the last 168 hours. Coagulation Profile: No results for input(s): INR, PROTIME in the last 168 hours. Cardiac Enzymes: No results for input(s): CKTOTAL, CKMB, CKMBINDEX, TROPONINI in the last 168 hours. BNP (last 3 results) No results for input(s): PROBNP in the last 8760 hours. HbA1C: Recent Labs    09/28/20 2149  HGBA1C 5.7*   CBG: Recent Labs  Lab 09/30/20 1232 09/30/20 1633 09/30/20 2156 10/01/20 0746 10/01/20 1140  GLUCAP 128* 130* 134* 115* 127*   Lipid Profile: No results for input(s): CHOL, HDL, LDLCALC, TRIG, CHOLHDL, LDLDIRECT in the last 72 hours. Thyroid Function Tests: No results for input(s): TSH, T4TOTAL, FREET4, T3FREE, THYROIDAB in the last 72 hours. Anemia Panel: No results for input(s): VITAMINB12, FOLATE, FERRITIN, TIBC, IRON, RETICCTPCT in the last 72 hours. Sepsis Labs: Recent Labs  Lab 09/28/20 1525 09/28/20 2149  LATICACIDVEN 1.2 1.6    Recent Results (from the past 240 hour(s))  Culture, blood (Routine x 2)     Status: None (Preliminary result)   Collection Time: 09/28/20  3:29 PM   Specimen: BLOOD  Result Value Ref Range Status   Specimen Description BLOOD RIGHT ANTECUBITAL  Final   Special Requests   Final    BOTTLES DRAWN AEROBIC AND ANAEROBIC Blood Culture adequate volume   Culture   Final    NO GROWTH 3 DAYS Performed at [facility], [address], [place], [place] [address]    Report Status PENDING  Incomplete  Culture, blood (Routine x 2)     Status: None (Preliminary result)   Collection Time: 09/28/20  5:47 PM   Specimen: BLOOD  Result Value Ref Range  Status   Specimen Description BLOOD LEFT ANTECUBITAL  Final   Special Requests   Final    BOTTLES DRAWN AEROBIC AND ANAEROBIC Blood Culture results may not be optimal due to an excessive volume of blood received in culture bottles   Culture   Final    NO GROWTH 3 DAYS Performed at [facility], [address], [place], [place] [address]    Report  Status PENDING  Incomplete  Respiratory Panel by RT PCR (Flu A&B, Covid) - Nasopharyngeal Swab     Status: None   Collection Time: 09/28/20  6:37 PM   Specimen: Nasopharyngeal Swab  Result Value Ref Range Status   SARS Coronavirus 2 by RT PCR NEGATIVE NEGATIVE Final    Comment: (NOTE) SARS-CoV-2 target nucleic acids are NOT DETECTED.  The SARS-CoV-2 RNA is generally detectable in upper respiratoy specimens during the acute phase of infection. The lowest concentration of SARS-CoV-2 viral copies this assay can detect is 131 copies/mL. A negative result does not preclude SARS-Cov-2 infection and should not be used as the sole basis for treatment or other patient management decisions. A negative result may occur with  improper specimen collection/handling, submission of specimen other than nasopharyngeal swab, presence of viral mutation(s) within the areas targeted by this assay, and inadequate number of viral copies (<131 copies/mL). A negative result must be combined with clinical observations, patient history, and epidemiological information. The expected result is Negative.  Fact Sheet for Patients:  PinkCheek.be  Fact Sheet for Healthcare Providers:  GravelBags.it  This test is no t yet approved or cleared by the [place] FDA and  has been authorized for detection and/or diagnosis of SARS-CoV-2 by FDA under an Emergency Use Authorization (EUA). This EUA will remain  in effect (meaning this test can be used) for the duration of the COVID-19 declaration under  Section 564(b)(1) of the Act, 21 U.S.C. section 360bbb-3(b)(1), unless the authorization is terminated or revoked sooner.     Influenza A by PCR NEGATIVE NEGATIVE Final   Influenza B by PCR NEGATIVE NEGATIVE Final    Comment: (NOTE) The Xpert Xpress SARS-CoV-2/FLU/RSV assay is intended as an aid in  the diagnosis of influenza from Nasopharyngeal swab specimens and  should not be used as a sole basis for treatment. Nasal washings and  aspirates are unacceptable for Xpert Xpress SARS-CoV-2/FLU/RSV  testing.  Fact Sheet for Patients: PinkCheek.be  Fact Sheet for Healthcare Providers: GravelBags.it  This test is not yet approved or cleared by the [place] FDA and  has been authorized for detection and/or diagnosis of SARS-CoV-2 by  FDA under an Emergency Use Authorization (EUA). This EUA will remain  in effect (meaning this test can be used) for the duration of the  Covid-19 declaration under Section 564(b)(1) of the Act, 21  U.S.C. section 360bbb-3(b)(1), unless the authorization is  terminated or revoked. Performed at [facility], [address] [place], [place] [address]   Aerobic Culture (superficial specimen)     Status: None (Preliminary result)   Collection Time: 09/28/20 11:22 PM   Specimen: SHOULDER  Result Value Ref Range Status   Specimen Description   Final    SHOULDER LEFT Performed at [facility], [place] [address], [place], [place] [address]    Special Requests   Final    NONE Performed at [facility], [place], [place] [address]    Gram Stain NO WBC SEEN NO ORGANISMS SEEN   Final   Culture   Final    CULTURE REINCUBATED FOR BETTER GROWTH Performed at [facility], [place] [address], [place], [place] [address]    Report Status PENDING  Incomplete  Aerobic Culture (superficial specimen)     Status: Abnormal   Collection Time: 09/28/20 11:22 PM    Specimen: Leg  Result Value Ref Range Status   Specimen Description   Final    LEG  LEFT Performed at [facility], [address] [place], [place] [address]    Special Requests   Final    NONE Performed at [facility], [place], [place] [address]    Gram Stain NO WBC SEEN FEW GRAM POSITIVE RODS   Final   Culture (A)  Final    MULTIPLE ORGANISMS PRESENT, NONE PREDOMINANT NO STAPHYLOCOCCUS AUREUS ISOLATED NO GROUP A STREP (S.PYOGENES) ISOLATED Performed at [facility], [address], [place], [place] [address]    Report Status 10/01/2020 FINAL  Final         Radiology Studies: No results found.      Scheduled Meds: . apixaban  5 mg Oral BID  . vitamin C  500 mg Oral BID  . aspirin EC  81 mg Oral Daily  . atorvastatin  40 mg Oral Daily  . B-complex with vitamin C   Oral Daily  . docusate sodium  200 mg Oral BID  . fluticasone  2 spray Each Nare Daily  . hydroxyurea  500 mg Oral Once per day on Sun Tue Wed Thu Fri Sat  . [START ON 10/05/2020] hydroxyurea  500 mg Oral 2 times per day every 7 days  . influenza vaccine adjuvanted  0.5 mL Intramuscular Tomorrow-1000  . insulin aspart  0-15 Units Subcutaneous TID WC  . [START ON 10/05/2020] lamoTRIgine  100 mg Oral BID   Followed by  . lamoTRIgine  100 mg Oral BID   Followed by  . [START ON 10/12/2020] lamoTRIgine  125 mg Oral BID   Followed by  . [START ON 10/19/2020] lamoTRIgine  125 mg Oral BID  . [START ON 10/26/2020] lamoTRIgine  150 mg Oral BID  . [START ON 10/05/2020] lamoTRIgine  25 mg Oral QHS  . [START ON 10/19/2020] lamoTRIgine  25 mg Oral QHS  . levETIRAcetam  500 mg Oral BID  . midodrine  10 mg Oral TID WC  . montelukast  10 mg Oral Daily  . NIFEdipine  30 mg Oral Daily  . sodium chloride flush  10-40 mL Intracatheter Q12H  . traZODone  50 mg Oral QHS  . umeclidinium-vilanterol  1 puff Inhalation Daily   Continuous Infusions: . sodium chloride 250 mL  (09/30/20 2045)  . ceFEPime (MAXIPIME) IV    . vancomycin       LOS: 2 days    Time spent: 34 mins     Wyvonnia Dusky, MD [facility] Pager [phone]  If 7PM-7AM, please contact night-coverage 10/01/2020, 2:15 PM

## 2020-10-02 DIAGNOSIS — L03116 Cellulitis of left lower limb: Principal | ICD-10-CM

## 2020-10-02 DIAGNOSIS — N179 Acute kidney failure, unspecified: Secondary | ICD-10-CM | POA: Diagnosis not present

## 2020-10-02 DIAGNOSIS — R21 Rash and other nonspecific skin eruption: Secondary | ICD-10-CM | POA: Diagnosis not present

## 2020-10-02 DIAGNOSIS — G40909 Epilepsy, unspecified, not intractable, without status epilepticus: Secondary | ICD-10-CM | POA: Diagnosis not present

## 2020-10-02 DIAGNOSIS — I5033 Acute on chronic diastolic (congestive) heart failure: Secondary | ICD-10-CM | POA: Diagnosis not present

## 2020-10-02 LAB — BASIC METABOLIC PANEL
Anion gap: 14 (ref 5–15)
BUN: 42 mg/dL — ABNORMAL HIGH (ref 8–23)
CO2: 23 mmol/L (ref 22–32)
Calcium: 9 mg/dL (ref 8.9–10.3)
Chloride: 99 mmol/L (ref 98–111)
Creatinine, Ser: 2.76 mg/dL — ABNORMAL HIGH (ref 0.44–1.00)
GFR, Estimated: 17 mL/min — ABNORMAL LOW (ref >60)
Glucose, Bld: 122 mg/dL — ABNORMAL HIGH (ref 70–99)
Potassium: 3.7 mmol/L (ref 3.5–5.1)
Sodium: 136 mmol/L (ref 135–145)

## 2020-10-02 LAB — CBC
HCT: 33.6 % — ABNORMAL LOW (ref 36.0–46.0)
Hemoglobin: 10.5 g/dL — ABNORMAL LOW (ref 12.0–15.0)
MCH: 28.1 pg (ref 26.0–34.0)
MCHC: 31.3 g/dL (ref 30.0–36.0)
MCV: 89.8 fL (ref 80.0–100.0)
Platelets: 566 10*3/uL — ABNORMAL HIGH (ref 150–400)
RBC: 3.74 MIL/uL — ABNORMAL LOW (ref 3.87–5.11)
RDW: 15.6 % — ABNORMAL HIGH (ref 11.5–15.5)
WBC: 17.5 10*3/uL — ABNORMAL HIGH (ref 4.0–10.5)
nRBC: 0 % (ref 0.0–0.2)

## 2020-10-02 LAB — GLUCOSE, CAPILLARY
Glucose-Capillary: 114 mg/dL — ABNORMAL HIGH (ref 70–99)
Glucose-Capillary: 182 mg/dL — ABNORMAL HIGH (ref 70–99)
Glucose-Capillary: 95 mg/dL (ref 70–99)
Glucose-Capillary: 88 mg/dL (ref 70–99)

## 2020-10-02 MED ORDER — PIPERACILLIN-TAZOBACTAM 3.375 G IVPB
3.3750 g | Freq: Two times a day (BID) | INTRAVENOUS | Status: DC
  Administered 2020-10-03: 3.375 g via INTRAVENOUS
  Filled 2020-10-02: qty 50

## 2020-10-02 MED ORDER — PIPERACILLIN-TAZOBACTAM 3.375 G IVPB
3.3750 g | Freq: Three times a day (TID) | INTRAVENOUS | Status: DC
  Administered 2020-10-02: 3.375 g via INTRAVENOUS
  Filled 2020-10-02: qty 50

## 2020-10-02 MED ORDER — DOXYCYCLINE HYCLATE 100 MG PO TABS
100.0000 mg | ORAL_TABLET | Freq: Two times a day (BID) | ORAL | Status: DC
  Administered 2020-10-02: 100 mg via ORAL
  Filled 2020-10-02: qty 1

## 2020-10-02 MED ORDER — LAMOTRIGINE 25 MG PO TABS
50.0000 mg | ORAL_TABLET | Freq: Two times a day (BID) | ORAL | Status: AC
  Administered 2020-10-02 – 2020-10-03 (×3): 50 mg via ORAL
  Filled 2020-10-02 (×3): qty 2

## 2020-10-02 MED ORDER — LAMOTRIGINE 25 MG PO TABS
25.0000 mg | ORAL_TABLET | Freq: Two times a day (BID) | ORAL | Status: DC
  Administered 2020-10-04 – 2020-10-05 (×3): 25 mg via ORAL
  Filled 2020-10-02 (×3): qty 1

## 2020-10-02 NOTE — Progress Notes (Signed)
Mobility Specialist - Progress Note   10/02/20 1500  Mobility  Activity Sat and stood x 3  Range of Motion/Exercises Right leg;Left leg (ankle pumps, hip iso, standing march, slr)  Level of Assistance Minimal assist, patient does 75% or more  Information systems manager Ambulated (ft) 0 ft  Mobility Response Tolerated well  Mobility performed by Mobility specialist  $Mobility charge 1 Mobility    Pre-mobility: 82 HR, 89% SpO2 During mobility: 87 HR, 92% SpO2 Post-mobility: 85 HR, 94% SpO2   Pt was lying in bed upon arrival utilizing 3L  O2. Pt agreed to session. Pt denied any pain, nausea, or fatigue. MD was present for most of session. Pt was able to get EOB with minA and extra time. Pt complained of feeling lightheaded upon sitting. Pt performed seated exercises: ankle pumps 1x10/leg, straight leg raises 2x10/leg, hip isometrics x10, and STSx3. O2 sat up to 94% during activity. Pt attempted STS from lowered bed height and presented difficulty elevating trunk from bedside. Mobility elevated bed height for next attempt. Pt stood to RW with minA. Repeated this process 3x, progressing to standing march for 20 secs on 3rd attempt. No LOB noted. VC needed for sequencing. Pt was able to transfer EOB-supine with modA. Noted pt's O2 desats to 88-89% with transfers. PLB technique utilized throughout session. NT was called to assist with repositioning pt to [facility]. Overall, pt tolerated session well. Pt was left in bed with LE elevated per MD orders. Pt has all needs in reach. Pt's spouse entered before exit. Nurse notified.    Kathee Delton Mobility Specialist 10/02/20, 3:53 PM

## 2020-10-02 NOTE — Plan of Care (Signed)
  Problem: Education: Goal: Knowledge of General Education information will improve Description: Including pain rating scale, medication(s)/side effects and non-pharmacologic comfort measures Outcome: Progressing   Problem: Clinical Measurements: Goal: Ability to maintain clinical measurements within normal limits will improve Outcome: Progressing Goal: Will remain free from infection Outcome: Not Progressing Goal: Diagnostic test results will improve Outcome: Not Progressing Goal: Respiratory complications will improve Outcome: Progressing   Problem: Activity: Goal: Risk for activity intolerance will decrease Outcome: Not Progressing   Problem: Safety: Goal: Ability to remain free from injury will improve Outcome: Progressing   Problem: Skin Integrity: Goal: Risk for impaired skin integrity will decrease Outcome: Progressing   Problem: Elimination: Goal: Will not experience complications related to urinary retention Outcome: Not Progressing   Problem: Elimination: Goal: Will not experience complications related to bowel motility Outcome: Not Progressing

## 2020-10-02 NOTE — TOC Progression Note (Addendum)
Transition of Care [facility]) - Progression Note    Patient Details  Name: Angelica Juarez MRN: [id number] Date of Birth: 08-Apr-1941  [facility] CM/SW Contact  Beverly Sessions, RN Phone Number: 10/02/2020, 12:18 PM  Clinical Narrative:    Per MD patient not medically ready for discharge.  Cr is still elevated Spoke with Claiborne Billings at [facility] , they will still be able to accept the patient over the weekend if patient is medically cleared.  Claiborne Billings states they do not have access to the [facility] on the weekend so discharge information would have to be faxed to the person on call.  She is to provide me with contact information this afternoon   Update:  If patient discharges over the week fax dc info to [phone] Call report to the main number and ask for the nurse on E hall    Expected Discharge Plan: [place] Barriers to Discharge: Continued Medical Work up  Expected Discharge Plan and Services Expected Discharge Plan: [place]   Discharge Planning Services: CM Consult   Living arrangements for the past 2 months: [place]: RN [facility] Agency: [place] ([place]) Date [place]: 09/30/20   Representative spoke with at [place]: [place] ([place]) Interventions    Readmission Risk Interventions Readmission Risk Prevention Plan 09/30/2020 06/03/2019  Transportation Screening Complete Complete  PCP or Specialist Appt within 5-7 Days - Complete  Home Care Screening - Complete  Medication Review (RN CM) - Complete  HRI or Home Care Consult Complete -  Palliative Care Screening Not Applicable -  Medication Review (RN Care Manager) Complete -  Some recent data might be hidden

## 2020-10-02 NOTE — Treatment Plan (Signed)
Wound culture collected and sent to lab.

## 2020-10-02 NOTE — Consult Note (Signed)
Pharmacy Antibiotic Note  Angelica Juarez is a 79 y.o. female admitted on 09/28/2020 with left leg cellulitis. PMH DM, HTN, A.fib on Eliquis, microscopic colitis s/p budesonide therapy. Patient was started on cefepime and vancomycin then switched to doxy for worsening Scr and is now on Zosyn alone. Since admission Scr trending up (creatinine: 0.92>1.67>2.49). Pt reports that her legs were swollen and was given diuretic and her right leg swelling resolved but left leg swelling progressed and now is sloughing off skin with ulcerative area. The left leg started to get progressively red and worse from last week Wednesday. Pharmacy has been consulted for Zosyn dosing.  Day 5 total antibiotics, WBC 16.3>17.5, afebrile. Scr continues to trend up. 11/15 WCx: Multi-org - no staph aureus / no group A strep; rare GNR.   Plan:  Received call from Dr. Delaine Lame.  MD requested dosing interval to be increased to q12h due to renal function and lack of illness severity at this time.  Pt given first dose started at 1742 today.  Ordered Zosyn 3.375 EI Q12hr to restart at 0600 tomorrow AM.  Pharmacy will continue to follow.  Renda Rolls, PharmD, [facility] 10/02/2020 5:56 PM

## 2020-10-02 NOTE — Consult Note (Addendum)
Pharmacy Antibiotic Note  Angelica Juarez is a 79 y.o. female admitted on 09/28/2020 with left leg cellulitis. PMH DM, HTN, A.fib on Eliquis, microscopic colitis s/p budesonide therapy. Patient was started on cefepime and vancomycin then switched to doxy for worsening Scr and is now on Zosyn alone. Since admission Scr trending up (creatinine: 0.92>1.67>2.49). Pt reports that her legs were swollen and was given diuretic and her right leg swelling resolved but left leg swelling progressed and now is sloughing off skin with ulcerative area. The left leg started to get progressively red and worse from last week Wednesday. Pharmacy has been consulted for Zosyn dosing.  Day 5 total antibiotics, WBC 16.3>17.5, afebrile. Scr continues to trend up. 11/15 WCx: Multi-org - no staph aureus / no group A strep; rare GNR.   Plan: Start Zosyn 3.375g IV q8h EI Monitor renal function and adjust dose as clinically indicated   Height: 5\' 7"  (170.2 cm) Weight: 107.2 kg (236 lb 5.3 oz) IBW/kg (Calculated) : 61.6  Temp (24hrs), Avg:97.6 F (36.4 C), Min:97.4 F (36.3 C), Max:97.8 F (36.6 C)  Recent Labs  Lab 09/28/20 1525 09/28/20 1530 09/28/20 2149 09/30/20 0557 10/01/20 0624 10/01/20 0841 10/02/20 0521  WBC 13.7*  --  14.4* 16.3*  --  17.5* 17.5*  CREATININE  --    < > 0.92 1.67* 2.34* 2.49* 2.76*  LATICACIDVEN 1.2  --  1.6  --   --   --   --   VANCOTROUGH  --   --   --   --  16  --   --    < > = values in this interval not displayed.    Antimicrobials this admission: 11/15 ceftriaxone x 1 11/15 cefepime >> 11/18 11/15 vancomycin >> 11/18 11/19 doxycycline x1  Microbiology results: 11/15 WCx (shoulder): NG (pending) 11/15 WCx (leg): Multi-org - no staph aureus / no group A strep; rare GNR 11/15 BCx: NGTD 11/15 SARS CoV-2: negative 11/15 influenza A/B: negative  Thank you for allowing pharmacy to be a part of this patient's care.  Sherilyn Banker, PharmD Pharmacy Resident   10/02/2020 3:37 PM

## 2020-10-02 NOTE — Consult Note (Signed)
NAME: Angelica Juarez  DOB: [birth year]  MRN: [id number]  Date/Time: 10/02/2020 2:29 PM  REQUESTING PROVIDER: Dr.Williams Subjective:  REASON FOR CONSULT: left leg cellulitis ? Angelica Juarez is a 79 y.o. female with a history of Polycythemia rubra, COPD, microscopic colitis, CHF, b/l TKA, was admitted on 11/15 with left leg swelling and blisters Pt is a poor historian, , her partner was able to give me some information She had developed swelling legs for a couple of weeks, the left started to get worse ,she was having redness and pain , there was a blister on the dorsum of the foot which the partner broke with a heated pin.Then the blister filled up again . One day she could not get up from the toilet because of the leg swelling and EMS came got her off the toilet.  On 11[facility]15 [facility] of worsening and was sent to the ED She also c/o of a rash on the upper back which is itchy and preceded the leg by a few weeks She has h/o seizures but none in the past 40 yrs. 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 Marius Ditch for 3 months and has been stopped since her diarrhea resolved  Past Medical History:  Diagnosis Date  . Allergy    Seasonal  . Arthritis   . 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 Chronic kidney disease    chronic renal insufficiency  . Colon polyps 05/21/14  . COPD (chronic obstructive pulmonary disease) ([place])   .  Diabetes mellitus without complication ([place])   . Dyspnea   . Fracture closed, humerus, shaft    right   . History of kidney stones    40 years ago  . Hyperlipidemia 04/30/14  . Hypertension   . Impingement syndrome of right shoulder 12/29/15  . Personal history of radiation therapy   . Pneumonia    hx  . Rotator cuff tear 04/21/16   right  . Seizures ([place])   . Sleep apnea     Past Surgical History:  Procedure Laterality Date  . ABDOMINAL HYSTERECTOMY    . COLONOSCOPY WITH PROPOFOL N/A 06/01/2020   Procedure: COLONOSCOPY WITH PROPOFOL;  Surgeon: Lin Landsman, MD;  Location: [facility] ENDOSCOPY;  Service: Gastroenterology;  Laterality: N/A;  . ESOPHAGOGASTRODUODENOSCOPY (EGD) WITH PROPOFOL N/A 06/01/2020   Procedure: ESOPHAGOGASTRODUODENOSCOPY (EGD) WITH PROPOFOL;  Surgeon: Lin Landsman, MD;  Location: [facility] ENDOSCOPY;  Service: Gastroenterology;  Laterality: N/A;  . 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 EYE SURGERY     bilateral cataracts  . JOINT REPLACEMENT     bilateral knee replacement  . NASAL SEPTUM SURGERY    . OOPHORECTOMY    . REPLACEMENT TOTAL KNEE Bilateral 2004  . SHOULDER ARTHROSCOPY WITH BICEPSTENOTOMY Right 05/25/2016   Procedure: SHOULDER ARTHROSCOPY WITH BICEPSTENOTOMY;  Surgeon: Leanor Kail, MD;  Location: [facility] ORS;  Service: Orthopedics;  Laterality: Right;  . SHOULDER ARTHROSCOPY WITH DISTAL CLAVICLE RESECTION Right 05/25/2016   Procedure: SHOULDER ARTHROSCOPY WITH DISTAL CLAVICLE RESECTION;  Surgeon: Leanor Kail, MD;  Location: [facility] ORS;  Service: Orthopedics;  Laterality: Right;  . SHOULDER ARTHROSCOPY WITH OPEN ROTATOR CUFF REPAIR Right 05/25/2016   Procedure: SHOULDER ARTHROSCOPY WITH OPEN ROTATOR CUFF REPAIR;  Surgeon: Leanor Kail, MD;  Location: [facility] ORS;  Service: Orthopedics;  Laterality: Right;  . SUBACROMIAL DECOMPRESSION Right 05/25/2016   Procedure: SUBACROMIAL DECOMPRESSION;  Surgeon: Leanor Kail, MD;  Location:  [facility] ORS;  Service: Orthopedics;  Laterality: Right;    Social History   Socioeconomic History  . Marital status: Single    Spouse name: Not on file  . Number of children: Not on file  . Years of education: Not on file  . Highest education level: Not on file  Occupational History  . Not on file  Tobacco Use  . Smoking status: Former Smoker    Packs/day: 1.00    Years: 40.00    Pack years: 40.00    Quit date: 05/12/2005    Years since quitting: 15.4  . Smokeless tobacco: Never Used  Vaping Use  . Vaping Use: Never used  Substance and Sexual Activity  . Alcohol use: No  . Drug use: No  . Sexual activity: Not on file  Other Topics Concern  . Not on file  Social History Narrative  . Not on file   Social Determinants of Health   Financial Resource Strain:   . Difficulty of Paying Living Expenses: Not on file  Food Insecurity:   . Worried About Charity fundraiser in the Last Year: Not on file  . Ran Out of Food in the Last Year: Not on file  Transportation Needs:   . Lack of Transportation (Medical): Not on file  . Lack of Transportation (Non-Medical): Not on file  Physical Activity:   . Days of Exercise per Week: Not on file  . Minutes of Exercise per Session: Not on file  Stress:   . Feeling of Stress : Not on file  Social Connections:   . Frequency of Communication with Friends and Family: Not on file  . Frequency of Social Gatherings with Friends and Family: Not on file  . Attends Religious Services: Not on file  . Active Member of Clubs or Organizations: Not on file  . Attends Archivist Meetings: Not on file  . Marital Status: Not on file  Intimate Partner Violence:   . Fear of Current or Ex-Partner: Not on file  . Emotionally Abused: Not on file  . Physically Abused: Not on file  . Sexually Abused: Not on file    Family History  Problem Relation Age of Onset  . Breast cancer Paternal Aunt 19  . Diabetes Father   . Heart disease Father   .  Breast cancer Daughter 57   Allergies  Allergen Reactions  . Iodinated Diagnostic Agents Anaphylaxis    Other reaction(s): Other (See Comments) Throat swells and extreme hives  . Latex Itching  . Phenobarbital Hives  . Tape Rash    silicones    ? Current Facility-Administered Medications  Medication Dose Route Frequency Provider Last Rate Last Admin  . 0.9 %  sodium chloride infusion   Intravenous PRN Para Skeans, MD   Stopped at 10/02/20 0414  . acetaminophen (TYLENOL) tablet 650 mg  650 mg Oral Q6H PRN Jennye Boroughs, MD   650 mg at 10/02/20 0937  .  albuterol (PROVENTIL) (2.5 MG/3ML) 0.083% nebulizer solution 2.5 mg  2.5 mg Nebulization Q6H PRN Para Skeans, MD   2.5 mg at 10/01/20 0026  . apixaban (ELIQUIS) tablet 5 mg  5 mg Oral BID Para Skeans, MD   5 mg at 10/02/20 8466  . ascorbic acid (VITAMIN C) tablet 500 mg  500 mg Oral BID Para Skeans, MD   500 mg at 10/02/20 5993  . aspirin EC tablet 81 mg  81 mg Oral Daily Para Skeans, MD   81 mg at 10/02/20 5701  . atorvastatin (LIPITOR) tablet 40 mg  40 mg Oral Daily Para Skeans, MD   40 mg at 10/01/20 1952  . B-complex with vitamin C tablet   Oral Daily Para Skeans, MD   1 tablet at 10/02/20 0940  . bisacodyl (DULCOLAX) EC tablet 5 mg  5 mg Oral Daily PRN Wyvonnia Dusky, MD      . ceFEPIme (MAXIPIME) 2 g in sodium chloride 0.9 % 100 mL IVPB  2 g Intravenous Q24H Lorna Dibble, RPH   Stopped at 10/01/20 2345  . docusate sodium (COLACE) capsule 200 mg  200 mg Oral BID Wyvonnia Dusky, MD   200 mg at 10/02/20 7793  . doxycycline (VIBRA-TABS) tablet 100 mg  100 mg Oral Q12H Wyvonnia Dusky, MD   100 mg at 10/02/20 9030  . fluticasone (FLONASE) 50 MCG/ACT nasal spray 2 spray  2 spray Each Nare Daily Oswald Hillock, RPH   2 spray at 10/02/20 0947  . hydroxyurea (HYDREA) capsule 500 mg  500 mg Oral Once per day on Sun Tue Wed Thu Fri Sat Dallie Piles, RPH   500 mg at 10/02/20 0923  . [START ON 10/05/2020]  hydroxyurea (HYDREA) capsule 500 mg  500 mg Oral 2 times per day every 7 days Dallie Piles, [facility]      . influenza vaccine adjuvanted (FLUAD) injection 0.5 mL  0.5 mL Intramuscular Tomorrow-1000 Florina Ou V, MD      . insulin aspart (novoLOG) injection 0-15 Units  0-15 Units Subcutaneous TID WC Para Skeans, MD   3 Units at 10/02/20 1322  . [START ON 10/05/2020] lamoTRIgine (LAMICTAL) tablet 100 mg  100 mg Oral BID Dallie Piles, RPH       Followed by  . lamoTRIgine (LAMICTAL) tablet 100 mg  100 mg Oral BID Dallie Piles, RPH   100 mg at 10/02/20 3007   Followed by  . [START ON 10/12/2020] lamoTRIgine (LAMICTAL) tablet 125 mg  125 mg Oral BID Dallie Piles, RPH       Followed by  . [START ON 10/19/2020] lamoTRIgine (LAMICTAL) tablet 125 mg  125 mg Oral BID Dallie Piles, [facility]      . [START ON 10/26/2020] lamoTRIgine (LAMICTAL) tablet 150 mg  150 mg Oral BID Dallie Piles, [facility]      . [START ON 10/05/2020] lamoTRIgine (LAMICTAL) tablet 25 mg  25 mg Oral QHS Dallie Piles, [facility]      . [START ON 10/19/2020] lamoTRIgine (LAMICTAL) tablet 25 mg  25 mg Oral QHS Dallie Piles, RPH      . levETIRAcetam (KEPPRA) tablet 500 mg  500 mg Oral BID Para Skeans, MD   500 mg at 10/02/20 0938  . midodrine (PROAMATINE) tablet 10 mg  10 mg Oral TID WC Wyvonnia Dusky, MD   10 mg at 10/02/20 1323  . montelukast (SINGULAIR)  tablet 10 mg  10 mg Oral Daily Para Skeans, MD   10 mg at 10/01/20 2021  . morphine 4 MG/ML injection 4 mg  4 mg Intravenous Q4H PRN Carrie Mew, MD   4 mg at 10/01/20 0615  . NIFEdipine (PROCARDIA-XL/NIFEDICAL-XL) 24 hr tablet 30 mg  30 mg Oral Daily Para Skeans, MD   30 mg at 10/02/20 0944  . sodium chloride flush (NS) 0.9 % injection 10-40 mL  10-40 mL Intracatheter Q12H Wyvonnia Dusky, MD   10 mL at 10/02/20 0945  . sodium chloride flush (NS) 0.9 % injection 10-40 mL  10-40 mL Intracatheter PRN Wyvonnia Dusky, MD      . traZODone (DESYREL) tablet 50 mg  50  mg Oral QHS Para Skeans, MD   50 mg at 10/01/20 2021  . umeclidinium-vilanterol (ANORO ELLIPTA) 62.5-25 MCG/INH 1 puff  1 puff Inhalation Daily Oswald Hillock, RPH   1 puff at 10/02/20 0947     Abtx:  Anti-infectives (From admission, onward)   Start     Dose/Rate Route Frequency Ordered Stop   10/02/20 1000  doxycycline (VIBRA-TABS) tablet 100 mg        100 mg Oral Every 12 hours 10/02/20 0834     10/01/20 2047  ceFEPIme (MAXIPIME) 2 g in sodium chloride 0.9 % 100 mL IVPB        2 g 200 mL/hr over 30 Minutes Intravenous Every 24 hours 10/01/20 0754     10/01/20 2000  vancomycin (VANCOREADY) IVPB 1500 mg/300 mL  Status:  Discontinued        1,500 mg 150 mL/hr over 120 Minutes Intravenous Every 48 hours 09/30/20 1435 10/02/20 0834   09/30/20 1200  ceFEPIme (MAXIPIME) 2 g in sodium chloride 0.9 % 100 mL IVPB        2 g 200 mL/hr over 30 Minutes Intravenous  Once 09/30/20 1036 09/30/20 1357   09/29/20 2000  vancomycin (VANCOREADY) IVPB 1500 mg/300 mL  Status:  Discontinued        1,500 mg 150 mL/hr over 120 Minutes Intravenous Every 24 hours 09/28/20 2007 09/30/20 1435   09/28/20 2100  ceFEPIme (MAXIPIME) 2 g in sodium chloride 0.9 % 100 mL IVPB  Status:  Discontinued        2 g 200 mL/hr over 30 Minutes Intravenous Every 12 hours 09/28/20 2007 10/01/20 0754   09/28/20 2000  vancomycin (VANCOCIN) IVPB 1000 mg/200 mL premix        1,000 mg 200 mL/hr over 60 Minutes Intravenous  Once 09/28/20 1754 09/28/20 2038   09/28/20 1730  vancomycin (VANCOCIN) IVPB 1000 mg/200 mL premix        1,000 mg 200 mL/hr over 60 Minutes Intravenous  Once 09/28/20 1726 09/28/20 1934   09/28/20 1730  cefTRIAXone (ROCEPHIN) 2 g in sodium chloride 0.9 % 100 mL IVPB        2 g 200 mL/hr over 30 Minutes Intravenous  Once 09/28/20 1726 09/28/20 1831      REVIEW OF SYSTEMS:  Const: negative fever, negative chills, negative weight loss Eyes: negative diplopia or visual changes, negative eye pain ENT: negative  coryza, negative sore throat Resp: ++ cough not productive , hemoptysis, has dyspnea, has home oxygen for COPD Cards: negative for chest pain, palpitations, has lower extremity edema GU: negative for frequency, dysuria and hematuria GI: Negative for abdominal pain, diarrhea, bleeding, constipation Skin: negative for rash and pruritus Heme: negative for easy bruising and gum/nose  bleeding MSgeneralized weakness Neurolo:negative for headaches, dizziness, vertigo, has memory problems  Psych: negative for feelings of anxiety, depression  Endocrine: negative for thyroid, diabetes Allergy/Immunology- as listed  Objective:  VITALS:  BP (!) 126/55   Pulse 72   Temp 97.8 F (36.6 C)   Resp 16   Ht 5\' 7"  (1.702 m)   Wt 107.2 kg   SpO2 95%   BMI 37.02 kg/m  PHYSICAL EXAM:  General: Alert, cooperative, no distress, appears stated age.  Head: Normocephalic, without obvious abnormality, atraumatic. Eyes: Conjunctivae clear, anicteric sclerae. Pupils are equal ENT Nares normal. No drainage or sinus tenderness. Geographic tongue    Neck: Supple, symmetrical, no adenopathy, thyroid: non tender no carotid bruit and no JVD. Back: No CVA tenderness. Lungs:b/l air entry Heart: Regular rate and rhythm, no murmur, rub or gallop. Abdomen: Soft, non-tender,not distended. Bowel sounds normal. No masses Extremities: b/l knee scars B/l leg edema left > rt Both legs have superficial erosions, ulceration because of deroofing of blisters- left front and on side Rt on the calf        Skin:upper back- plaque like lesions and some have a superficial erosion    Lymph: Cervical, supraclavicular normal. Neurologic: Grossly non-focal Pertinent Labs Lab Results CBC    Component Value Date/Time   WBC 17.5 (H) 10/02/2020 0521   RBC 3.74 (L) 10/02/2020 0521   HGB 10.5 (L) 10/02/2020 0521   HGB 13.3 02/20/2014 2351   HCT 33.6 (L) 10/02/2020 0521   HCT 41.0 02/20/2014 2351   PLT 566 (H)  10/02/2020 0521   PLT 397 02/20/2014 2351   MCV 89.8 10/02/2020 0521   MCV 96 02/20/2014 2351   MCH 28.1 10/02/2020 0521   MCHC 31.3 10/02/2020 0521   RDW 15.6 (H) 10/02/2020 0521   RDW 13.4 02/20/2014 2351   LYMPHSABS 0.5 (L) 09/30/2020 0557   LYMPHSABS 0.9 (L) 09/24/2012 1309   MONOABS 1.4 (H) 09/30/2020 0557   MONOABS 0.5 09/24/2012 1309   EOSABS 0.4 09/30/2020 0557   EOSABS 0.5 09/24/2012 1309   BASOSABS 0.1 09/30/2020 0557   BASOSABS 1 09/25/2012 0425   BASOSABS 0.1 09/24/2012 1309    CMP Latest Ref Rng & Units 10/02/2020 10/01/2020 10/01/2020  Glucose 70 - 99 mg/dL 122(H) 126(H) -  BUN 8 - 23 mg/dL 42(H) 35(H) -  Creatinine 0.44 - 1.00 mg/dL 2.76(H) 2.49(H) 2.34(H)  Sodium 135 - 145 mmol/L 136 137 -  Potassium 3.5 - 5.1 mmol/L 3.7 3.6 -  Chloride 98 - 111 mmol/L 99 99 -  CO2 22 - 32 mmol/L 23 25 -  Calcium 8.9 - 10.3 mg/dL 9.0 9.0 -  Total Protein 6.5 - 8.1 g/dL - - -  Total Bilirubin 0.3 - 1.2 mg/dL - - -  Alkaline Phos 38 - 126 U/L - - -  AST 15 - 41 U/L - - -  ALT 0 - 44 U/L - - -      Microbiology: Recent Results (from the past 240 hour(s))  Culture, blood (Routine x 2)     Status: None (Preliminary result)   Collection Time: 09/28/20  3:29 PM   Specimen: BLOOD  Result Value Ref Range Status   Specimen Description BLOOD RIGHT ANTECUBITAL  Final   Special Requests   Final    BOTTLES DRAWN AEROBIC AND ANAEROBIC Blood Culture adequate volume   Culture   Final    NO GROWTH 4 DAYS Performed at [facility], [address], [place], [place] [address]  Report Status PENDING  Incomplete  Culture, blood (Routine x 2)     Status: None (Preliminary result)   Collection Time: 09/28/20  5:47 PM   Specimen: BLOOD  Result Value Ref Range Status   Specimen Description BLOOD LEFT ANTECUBITAL  Final   Special Requests   Final    BOTTLES DRAWN AEROBIC AND ANAEROBIC Blood Culture results may not be optimal due to an excessive volume of blood received in  culture bottles   Culture   Final    NO GROWTH 4 DAYS Performed at [facility], [address], [place], [place] [address]    Report Status PENDING  Incomplete  Respiratory Panel by RT PCR (Flu A&B, Covid) - Nasopharyngeal Swab     Status: None   Collection Time: 09/28/20  6:37 PM   Specimen: Nasopharyngeal Swab  Result Value Ref Range Status   SARS Coronavirus 2 by RT PCR NEGATIVE NEGATIVE Final    Comment: (NOTE) SARS-CoV-2 target nucleic acids are NOT DETECTED.  The SARS-CoV-2 RNA is generally detectable in upper respiratoy specimens during the acute phase of infection. The lowest concentration of SARS-CoV-2 viral copies this assay can detect is 131 copies/mL. A negative result does not preclude SARS-Cov-2 infection and should not be used as the sole basis for treatment or other patient management decisions. A negative result may occur with  improper specimen collection/handling, submission of specimen other than nasopharyngeal swab, presence of viral mutation(s) within the areas targeted by this assay, and inadequate number of viral copies (<131 copies/mL). A negative result must be combined with clinical observations, patient history, and epidemiological information. The expected result is Negative.  Fact Sheet for Patients:  PinkCheek.be  Fact Sheet for Healthcare Providers:  GravelBags.it  This test is no t yet approved or cleared by the [place] FDA and  has been authorized for detection and/or diagnosis of SARS-CoV-2 by FDA under an Emergency Use Authorization (EUA). This EUA will remain  in effect (meaning this test can be used) for the duration of the COVID-19 declaration under Section 564(b)(1) of the Act, 21 U.S.C. section 360bbb-3(b)(1), unless the authorization is terminated or revoked sooner.     Influenza A by PCR NEGATIVE NEGATIVE Final   Influenza B by PCR NEGATIVE NEGATIVE Final     Comment: (NOTE) The Xpert Xpress SARS-CoV-2/FLU/RSV assay is intended as an aid in  the diagnosis of influenza from Nasopharyngeal swab specimens and  should not be used as a sole basis for treatment. Nasal washings and  aspirates are unacceptable for Xpert Xpress SARS-CoV-2/FLU/RSV  testing.  Fact Sheet for Patients: PinkCheek.be  Fact Sheet for Healthcare Providers: GravelBags.it  This test is not yet approved or cleared by the [place] FDA and  has been authorized for detection and/or diagnosis of SARS-CoV-2 by  FDA under an Emergency Use Authorization (EUA). This EUA will remain  in effect (meaning this test can be used) for the duration of the  Covid-19 declaration under Section 564(b)(1) of the Act, 21  U.S.C. section 360bbb-3(b)(1), unless the authorization is  terminated or revoked. Performed at [facility], [address] [place], [place] [address]   Aerobic Culture (superficial specimen)     Status: None (Preliminary result)   Collection Time: 09/28/20 11:22 PM   Specimen: SHOULDER  Result Value Ref Range Status   Specimen Description   Final    SHOULDER LEFT Performed at [facility], [place] [address] [place], [place] [address]    Special  Requests   Final    NONE Performed at [facility], [place], [place] [address]    Gram Stain NO WBC SEEN NO ORGANISMS SEEN   Final   Culture   Final    CULTURE REINCUBATED FOR BETTER GROWTH Performed at [facility], [place] [address], [place], [place] [address]    Report Status PENDING  Incomplete  Aerobic Culture (superficial specimen)     Status: Abnormal   Collection Time: 09/28/20 11:22 PM   Specimen: Leg  Result Value Ref Range Status   Specimen Description   Final    LEG LEFT Performed at [facility], [address] [place], [place] [address]    Special Requests   Final    NONE Performed at  [facility], [place], [place] [address]    Gram Stain NO WBC SEEN FEW GRAM POSITIVE RODS   Final   Culture (A)  Final    MULTIPLE ORGANISMS PRESENT, NONE PREDOMINANT NO STAPHYLOCOCCUS AUREUS ISOLATED NO GROUP A STREP (S.PYOGENES) ISOLATED Performed at [facility], [place] [address], [place],  [address]    Report Status 10/01/2020 FINAL  Final  Resp Panel by RT-PCR (Flu A&B, Covid) Nasopharyngeal Swab     Status: None   Collection Time: 10/01/20  6:35 PM   Specimen: Nasopharyngeal Swab; Nasopharyngeal(NP) swabs in vial transport medium  Result Value Ref Range Status   SARS Coronavirus 2 by RT PCR NEGATIVE NEGATIVE Final    Comment: (NOTE) SARS-CoV-2 target nucleic acids are NOT DETECTED.  The SARS-CoV-2 RNA is generally detectable in upper respiratory specimens during the acute phase of infection. The lowest concentration of SARS-CoV-2 viral copies this assay can detect is 138 copies/mL. A negative result does not preclude SARS-Cov-2 infection and should not be used as the sole basis for treatment or other patient management decisions. A negative result may occur with  improper specimen collection/handling, submission of specimen other than nasopharyngeal swab, presence of viral mutation(s) within the areas targeted by this assay, and inadequate number of viral copies(<138 copies/mL). A negative result must be combined with clinical observations, patient history, and epidemiological information. The expected result is Negative.  Fact Sheet for Patients:  EntrepreneurPulse.com.au  Fact Sheet for Healthcare Providers:  IncredibleEmployment.be  This test is no t yet approved or cleared by the [place] FDA and  has been authorized for detection and/or diagnosis of SARS-CoV-2 by FDA under an Emergency Use Authorization (EUA). This EUA will remain  in effect (meaning this test can be used) for the  duration of the COVID-19 declaration under Section 564(b)(1) of the Act, 21 U.S.C.section 360bbb-3(b)(1), unless the authorization is terminated  or revoked sooner.       Influenza A by PCR NEGATIVE NEGATIVE Final   Influenza B by PCR NEGATIVE NEGATIVE Final    Comment: (NOTE) The Xpert Xpress SARS-CoV-2/FLU/RSV plus assay is intended as an aid in the diagnosis of influenza from Nasopharyngeal swab specimens and should not be used as a sole basis for treatment. Nasal washings and aspirates are unacceptable for Xpert Xpress SARS-CoV-2/FLU/RSV testing.  Fact Sheet for Patients: EntrepreneurPulse.com.au  Fact Sheet for Healthcare Providers: IncredibleEmployment.be  This test is not yet approved or cleared by the [place] FDA and has been authorized for detection and/or diagnosis of SARS-CoV-2 by FDA under an Emergency Use Authorization (EUA). This EUA will remain in effect (meaning this test can be used) for the duration of the COVID-19  declaration under Section 564(b)(1) of the Act, 21 U.S.C. section 360bbb-3(b)(1), unless the authorization is terminated or revoked.  Performed at [facility] [place] [place], [place] [address]     IMAGING RESULTS:  I have personally reviewed the films ? Impression/Recommendation ? ?B/l leg edema with ulceration and erythema left leg likely due to excess edema Now has secondary infection D.D bullous lesions like pemhigus, pemphigoid or drug induced  Upper back plaque like lesions with erosions- r/o Pemphigus, pemphigoid or drug rash due to lamictal or mycoises fungoides??  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 ___________________________________________________ Discussed with patient,her husband, pharmacist requesting provider

## 2020-10-02 NOTE — Progress Notes (Signed)
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)   Essential hypertension   Seizure disorder ([place])   Type 2 diabetes mellitus with microalbuminuria, without long-term current use of insulin (H[facility])   Rash and nonspecific skin eruption   Acute on chronic diastolic CHF (congestive heart failure) ([facility])   Left leg cellulitis: improving slowly. Continue on IV cefepime & doxycycline. D/c IV vanco as Cr continues to rise. Will need to see dermatology as an outpatient   Leukocytosis: secondary to infection. Continue on IV abxs  Thrombocytosis: etiology unclear. Will continue to monitor   AKI: Cr is trending up daily. D/c IV vanco. Will continue to hold home dose of ACE-I, lasix & HCTZ   Acute on chronic diastolic CHF: hold lasix secondary to AKI. Strict I/Os and daily weights. Echo on 06/12/2020 showed EF estimated at 50 to 55%, normal LV diastolic parameters, mild to moderate mitral regurgitation, mild to moderate TR.   Hypotension: etiology unclear. Will continue on midodrine   COPD with asthma: w/o exacerbation. Continue on bronchodilators and encourage incentive spirometry   Chronic hypoxic respiratory failure: continue on supplemental oxygen, currently 3L [place] which is pt's baseline   Seizure disorder: continue on keppra, lamotrigine    DVT prophylaxis: eliquis  Code Status: full  Family Communication: Disposition Plan: likely d/c to SNF   Status is: Inpatient  Remains inpatient appropriate because:Ongoing diagnostic testing needed not appropriate for outpatient work up, Unsafe d/c plan and IV treatments appropriate due to intensity of illness or inability to take PO   Dispo: The patient is from: Home              Anticipated d/c is to:  SNF               Anticipated d/c date is: 1 day or 2 days if Cr  improves & cellulitis improves              Patient currently is not medically stable to d/c.     Consultants:      Procedures:    Antimicrobials: cefepime, doxycycline    Subjective: Pt c/o generalized weakness   Objective: Vitals:   10/01/20 2026 10/02/20 0031 10/02/20 0406 10/02/20 0511  BP: (!) 110/50 (!) 105/41 (!) 115/51   Pulse: 74 83 76   Resp:  20 20   Temp: (!) 97.5 F (36.4 C) 97.6 F (36.4 C) (!) 97.5 F (36.4 C)   TempSrc: Oral Oral Oral   SpO2: 96% 93% 91%   Weight:    107.2 kg  Height:        Intake/Output Summary (Last 24 hours) at 10/02/2020 0751 Last data filed at 10/02/2020 0500 Gross per 24 hour  Intake 747.37 ml  Output 0 ml  Net 747.37 ml   Filed Weights   09/30/20 0500 10/01/20 0418 10/02/20 0511  Weight: 104.4 kg 104.8 kg 107.2 kg    Examination:  General exam: Appears calm & comfortable   Respiratory system: decreased breath sounds b/l.  Cardiovascular system: S1 & S2+. No rubs or gallops  Gastrointestinal system: Abdomen is soft, obese, non-tender and hypoactive bowel sounds  Central nervous system: Alert and oriented. Moves all 4 extremities  Psychiatry: Judgement and insight appear normal. Flat mood and affect  Skin:  B/l LE tenderness to palpation,  edematous & erythema L>R. Scaling dry skin of b/l LE     Data Reviewed: I have personally reviewed following labs and imaging studies  CBC: Recent Labs  Lab 09/28/20 1525 09/28/20 2149 09/30/20 0557 10/01/20 0841 10/02/20 0521  WBC 13.7* 14.4* 16.3* 17.5* 17.5*  NEUTROABS 11.6*  --  13.6*  --   --   HGB 11.3* 11.3* 11.1* 10.7* 10.5*  HCT 36.2 36.5 36.6 34.5* 33.6*  MCV 90.5 90.6 92.0 91.0 89.8  PLT 545* 571* 531* 543* 093*   Basic Metabolic Panel: Recent Labs  Lab 09/28/20 1530 09/28/20 1530 09/28/20 2149 09/30/20 0557 10/01/20 0624 10/01/20 0841 10/02/20 0521  NA 139  --   --  139  --  137 136  K 3.6  --   --  3.6  --  3.6 3.7  CL 99  --   --  99  --  99  99  CO2 28  --   --  28  --  25 23  GLUCOSE 135*  --   --  146*  --  126* 122*  BUN 16  --   --  25*  --  35* 42*  CREATININE 0.93   < > 0.92 1.67* 2.34* 2.49* 2.76*  CALCIUM 9.8  --   --  9.4  --  9.0 9.0  MG  --   --   --  1.8  --   --   --    < > = values in this interval not displayed.   GFR: Estimated Creatinine Clearance: 21.5 mL/min (A) (by C-G formula based on SCr of 2.76 mg/dL (H)). Liver Function Tests: No results for input(s): AST, ALT, ALKPHOS, BILITOT, PROT, ALBUMIN in the last 168 hours. No results for input(s): LIPASE, AMYLASE in the last 168 hours. No results for input(s): AMMONIA in the last 168 hours. Coagulation Profile: No results for input(s): INR, PROTIME in the last 168 hours. Cardiac Enzymes: No results for input(s): CKTOTAL, CKMB, CKMBINDEX, TROPONINI in the last 168 hours. BNP (last 3 results) No results for input(s): PROBNP in the last 8760 hours. HbA1C: No results for input(s): HGBA1C in the last 72 hours. CBG: Recent Labs  Lab 10/01/20 1140 10/01/20 1422 10/01/20 1727 10/01/20 2120 10/02/20 0741  GLUCAP 127* 119* 125* 116* 114*   Lipid Profile: No results for input(s): CHOL, HDL, LDLCALC, TRIG, CHOLHDL, LDLDIRECT in the last 72 hours. Thyroid Function Tests: No results for input(s): TSH, T4TOTAL, FREET4, T3FREE, THYROIDAB in the last 72 hours. Anemia Panel: No results for input(s): VITAMINB12, FOLATE, FERRITIN, TIBC, IRON, RETICCTPCT in the last 72 hours. Sepsis Labs: Recent Labs  Lab 09/28/20 1525 09/28/20 2149  LATICACIDVEN 1.2 1.6    Recent Results (from the past 240 hour(s))  Culture, blood (Routine x 2)     Status: None (Preliminary result)   Collection Time: 09/28/20  3:29 PM   Specimen: BLOOD  Result Value Ref Range Status   Specimen Description BLOOD RIGHT ANTECUBITAL  Final   Special Requests   Final    BOTTLES DRAWN AEROBIC AND ANAEROBIC Blood Culture adequate volume   Culture   Final    NO GROWTH 4 DAYS Performed at  [facility], [address] [place], [place] [address]    Report Status PENDING  Incomplete  Culture, blood (Routine x 2)     Status: None (Preliminary result)   Collection Time: 09/28/20  5:47 PM   Specimen: BLOOD  Result Value Ref Range Status  Specimen Description BLOOD LEFT ANTECUBITAL  Final   Special Requests   Final    BOTTLES DRAWN AEROBIC AND ANAEROBIC Blood Culture results may not be optimal due to an excessive volume of blood received in culture bottles   Culture   Final    NO GROWTH 4 DAYS Performed at [facility], [address], [place], [place] [address]    Report Status PENDING  Incomplete  Respiratory Panel by RT PCR (Flu A&B, Covid) - Nasopharyngeal Swab     Status: None   Collection Time: 09/28/20  6:37 PM   Specimen: Nasopharyngeal Swab  Result Value Ref Range Status   SARS Coronavirus 2 by RT PCR NEGATIVE NEGATIVE Final    Comment: (NOTE) SARS-CoV-2 target nucleic acids are NOT DETECTED.  The SARS-CoV-2 RNA is generally detectable in upper respiratoy specimens during the acute phase of infection. The lowest concentration of SARS-CoV-2 viral copies this assay can detect is 131 copies/mL. A negative result does not preclude SARS-Cov-2 infection and should not be used as the sole basis for treatment or other patient management decisions. A negative result may occur with  improper specimen collection/handling, submission of specimen other than nasopharyngeal swab, presence of viral mutation(s) within the areas targeted by this assay, and inadequate number of viral copies (<131 copies/mL). A negative result must be combined with clinical observations, patient history, and epidemiological information. The expected result is Negative.  Fact Sheet for Patients:  PinkCheek.be  Fact Sheet for Healthcare Providers:  GravelBags.it  This test is no t yet approved or cleared by the [place] FDA and  has been authorized for detection and/or diagnosis of SARS-CoV-2 by FDA under an Emergency Use Authorization (EUA). This EUA will remain  in effect (meaning this test can be used) for the duration of the COVID-19 declaration under Section 564(b)(1) of the Act, 21 U.S.C. section 360bbb-3(b)(1), unless the authorization is terminated or revoked sooner.     Influenza A by PCR NEGATIVE NEGATIVE Final   Influenza B by PCR NEGATIVE NEGATIVE Final    Comment: (NOTE) The Xpert Xpress SARS-CoV-2/FLU/RSV assay is intended as an aid in  the diagnosis of influenza from Nasopharyngeal swab specimens and  should not be used as a sole basis for treatment. Nasal washings and  aspirates are unacceptable for Xpert Xpress SARS-CoV-2/FLU/RSV  testing.  Fact Sheet for Patients: PinkCheek.be  Fact Sheet for Healthcare Providers: GravelBags.it  This test is not yet approved or cleared by the [place] FDA and  has been authorized for detection and/or diagnosis of SARS-CoV-2 by  FDA under an Emergency Use Authorization (EUA). This EUA will remain  in effect (meaning this test can be used) for the duration of the  Covid-19 declaration under Section 564(b)(1) of the Act, 21  U.S.C. section 360bbb-3(b)(1), unless the authorization is  terminated or revoked. Performed at [facility], [address] [place], [place] [address]   Aerobic Culture (superficial specimen)     Status: None (Preliminary result)   Collection Time: 09/28/20 11:22 PM   Specimen: SHOULDER  Result Value Ref Range Status   Specimen Description   Final    SHOULDER LEFT Performed at [facility], [place] [address] [place], [place] [address]    Special Requests   Final    NONE Performed at [facility] [place], [place] [address]    Gram Stain NO WBC SEEN NO ORGANISMS SEEN   Final   Culture   Final  CULTURE  REINCUBATED FOR BETTER GROWTH Performed at [facility], [place] [address], [place], [place] [address]    Report Status PENDING  Incomplete  Aerobic Culture (superficial specimen)     Status: Abnormal   Collection Time: 09/28/20 11:22 PM   Specimen: Leg  Result Value Ref Range Status   Specimen Description   Final    LEG LEFT Performed at [facility], [address], [place], [place] [address]    Special Requests   Final    NONE Performed at [facility], [place], [place] [address]    Gram Stain NO WBC SEEN FEW GRAM POSITIVE RODS   Final   Culture (A)  Final    MULTIPLE ORGANISMS PRESENT, NONE PREDOMINANT NO STAPHYLOCOCCUS AUREUS ISOLATED NO GROUP A STREP (S.PYOGENES) ISOLATED Performed at [facility], [place] [address], [place], [place] [address]    Report Status 10/01/2020 FINAL  Final  Resp Panel by RT-PCR (Flu A&B, Covid) Nasopharyngeal Swab     Status: None   Collection Time: 10/01/20  6:35 PM   Specimen: Nasopharyngeal Swab; Nasopharyngeal(NP) swabs in vial transport medium  Result Value Ref Range Status   SARS Coronavirus 2 by RT PCR NEGATIVE NEGATIVE Final    Comment: (NOTE) SARS-CoV-2 target nucleic acids are NOT DETECTED.  The SARS-CoV-2 RNA is generally detectable in upper respiratory specimens during the acute phase of infection. The lowest concentration of SARS-CoV-2 viral copies this assay can detect is 138 copies/mL. A negative result does not preclude SARS-Cov-2 infection and should not be used as the sole basis for treatment or other patient management decisions. A negative result may occur with  improper specimen collection/handling, submission of specimen other than nasopharyngeal swab, presence of viral mutation(s) within the areas targeted by this assay, and inadequate number of viral copies(<138 copies/mL). A negative result must be combined with clinical observations, patient history, and  epidemiological information. The expected result is Negative.  Fact Sheet for Patients:  EntrepreneurPulse.com.au  Fact Sheet for Healthcare Providers:  IncredibleEmployment.be  This test is no t yet approved or cleared by the [place] FDA and  has been authorized for detection and/or diagnosis of SARS-CoV-2 by FDA under an Emergency Use Authorization (EUA). This EUA will remain  in effect (meaning this test can be used) for the duration of the COVID-19 declaration under Section 564(b)(1) of the Act, 21 U.S.C.section 360bbb-3(b)(1), unless the authorization is terminated  or revoked sooner.       Influenza A by PCR NEGATIVE NEGATIVE Final   Influenza B by PCR NEGATIVE NEGATIVE Final    Comment: (NOTE) The Xpert Xpress SARS-CoV-2/FLU/RSV plus assay is intended as an aid in the diagnosis of influenza from Nasopharyngeal swab specimens and should not be used as a sole basis for treatment. Nasal washings and aspirates are unacceptable for Xpert Xpress SARS-CoV-2/FLU/RSV testing.  Fact Sheet for Patients: EntrepreneurPulse.com.au  Fact Sheet for Healthcare Providers: IncredibleEmployment.be  This test is not yet approved or cleared by the [place] FDA and has been authorized for detection and/or diagnosis of SARS-CoV-2 by FDA under an Emergency Use Authorization (EUA). This EUA will remain in effect (meaning this test can be used) for the duration of the COVID-19 declaration under Section 564(b)(1) of the Act, 21 U.S.C. section 360bbb-3(b)(1), unless the authorization is terminated or revoked.  Performed at [facility], [address] [place], [place] [address]          Radiology Studies: No  results found.      Scheduled Meds: . apixaban  5 mg Oral BID  . vitamin C  500 mg Oral BID  . aspirin EC  81 mg Oral Daily  . atorvastatin  40 mg Oral Daily  . B-complex with  vitamin C   Oral Daily  . docusate sodium  200 mg Oral BID  . fluticasone  2 spray Each Nare Daily  . hydroxyurea  500 mg Oral Once per day on Sun Tue Wed Thu Fri Sat  . [START ON 10/05/2020] hydroxyurea  500 mg Oral 2 times per day every 7 days  . influenza vaccine adjuvanted  0.5 mL Intramuscular Tomorrow-1000  . insulin aspart  0-15 Units Subcutaneous TID WC  . [START ON 10/05/2020] lamoTRIgine  100 mg Oral BID   Followed by  . lamoTRIgine  100 mg Oral BID   Followed by  . [START ON 10/12/2020] lamoTRIgine  125 mg Oral BID   Followed by  . [START ON 10/19/2020] lamoTRIgine  125 mg Oral BID  . [START ON 10/26/2020] lamoTRIgine  150 mg Oral BID  . [START ON 10/05/2020] lamoTRIgine  25 mg Oral QHS  . [START ON 10/19/2020] lamoTRIgine  25 mg Oral QHS  . levETIRAcetam  500 mg Oral BID  . midodrine  10 mg Oral TID WC  . montelukast  10 mg Oral Daily  . NIFEdipine  30 mg Oral Daily  . sodium chloride flush  10-40 mL Intracatheter Q12H  . traZODone  50 mg Oral QHS  . umeclidinium-vilanterol  1 puff Inhalation Daily   Continuous Infusions: . sodium chloride Stopped (10/02/20 0414)  . ceFEPime (MAXIPIME) IV Stopped (10/01/20 2345)  . vancomycin Stopped (10/01/20 2219)     LOS: 3 days    Time spent: 32 mins     Wyvonnia Dusky, MD [facility] Pager [phone]  If 7PM-7AM, please contact night-coverage 10/02/2020, 7:51 AM

## 2020-10-02 NOTE — Progress Notes (Signed)
Patient is having difficulty starting stream so she was bladder scanned. There was a total of 286 in her bladder. Will continue to monitor for more output.  Angelica Juarez  10/02/2020  5:05 AM

## 2020-10-02 NOTE — Progress Notes (Addendum)
Physical Therapy Treatment Patient Details Name: Angelica Juarez MRN: [id number] DOB: [birth year] Today's Date: 10/02/2020    History of Present Illness Pt is a 79 y/o F with PMH: HTN, HLD, T2DM, CKD, CHF, pAfib, seixures, OSA on CPAP and COPD on 2Lnc chronically who presented to ED d/t open wounds on the left lower leg and foot with thick discharge, onset 2 days PTA and worsening.  Admitted for management of L LE cellulitis, acute exacerbation of CHF.    PT Comments    Pt in bed, inc of urine calling for assist upon arrival to get to [facility]  Pt with poor motor planning to transition to EOB reaching for end of bed and even for tray table grabbing coffee trying to sit up spilling it on tray.  Verbal and tactile cues for sequencing and to slow down and plan movements.  Min a to sit fully up but once sitting generally steady.  Stood to walker again unorganized trying to pull up on handles..  Cues to push from bed and despite cues unable to push from bed.  Walker held so she could pull up and transfer with min/mod a x 2 to commode.  Bathing and linen changes with tech assist.  Stood and transferred to recliner after for breakfast and MD consult.  Gait remains generally unsteady with +2 assist needed for safety and balance.  Remained in recliner after session.  Pt seemed generally confused initially but cleared during session.   Follow Up Recommendations  SNF     Equipment Recommendations       Recommendations for Other Services       Precautions / Restrictions Precautions Precautions: Fall    Mobility  Bed Mobility Overal bed mobility: Needs Assistance Bed Mobility: Supine to Sit     Supine to sit: Min assist     General bed mobility comments: poor organization and sequencing.  verbal and physical cues to assist.  Transfers Overall transfer level: Needs assistance Equipment used: Rolling walker (2 wheeled) Transfers: Sit to/from Stand Sit to Stand: Mod assist;Min assist;+2 physical  assistance;+2 safety/equipment         General transfer comment: reaching for walker, unable to push from bed and  Ambulation/Gait Ambulation/Gait assistance: Min assist;+2 physical assistance;+2 safety/equipment Gait Distance (Feet): 3 Feet Assistive device: Rolling walker (2 wheeled) Gait Pattern/deviations: Step-to pattern;Decreased step length - right;Decreased step length - left Gait velocity: decreased   General Gait Details: unsteady gait, more WB accepted L LE but +2 for safety required   Stairs             Wheelchair Mobility    Modified Rankin (Stroke Patients Only)       Balance Overall balance assessment: Needs assistance Sitting-balance support: No upper extremity supported;Feet supported Sitting balance-Leahy Scale: Fair Sitting balance - Comments: supervision for safety but no assist once sitting   Standing balance support: Bilateral upper extremity supported Standing balance-Leahy Scale: Poor Standing balance comment: UE support with RW                            Cognition Arousal/Alertness: Awake/alert Behavior During Therapy: Impulsive;WFL for tasks assessed/performed Overall Cognitive Status: Difficult to assess                                 General Comments: seemed confused initially but cleared as session progressed  Exercises Other Exercises Other Exercises: to commode and bathing with tech assist    General Comments        Pertinent Vitals/Pain Pain Assessment: Faces Faces Pain Scale: Hurts a little bit Pain Location: bilat LEs Pain Descriptors / Indicators: Tender;Sore Pain Intervention(s): Limited activity within patient's tolerance;Monitored during session;Repositioned    Home Living                      Prior Function            PT Goals (current goals can now be found in the care plan section) Progress towards PT goals: Progressing toward goals    Frequency    Min  2X/week      PT Plan      Co-evaluation              AM-PAC PT "6 Clicks" Mobility   Outcome Measure  Help needed turning from your back to your side while in a flat bed without using bedrails?: A Little Help needed moving from lying on your back to sitting on the side of a flat bed without using bedrails?: A Lot Help needed moving to and from a bed to a chair (including a wheelchair)?: A Lot Help needed standing up from a chair using your arms (e.g., wheelchair or bedside chair)?: A Lot Help needed to walk in hospital room?: A Lot Help needed climbing 3-5 steps with a railing? : A Lot 6 Click Score: 13    End of Session Equipment Utilized During Treatment: Gait belt Activity Tolerance: Patient limited by pain Patient left: in bed;with call bell/phone within reach;with bed alarm set;with family/visitor present Nurse Communication: Mobility status PT Visit Diagnosis: Muscle weakness (generalized) (M62.81);Difficulty in walking, not elsewhere classified (R26.2);Pain Pain - Right/Left: Left Pain - part of body: Leg     Time: 9407-6808 PT Time Calculation (min) (ACUTE ONLY): 17 min  Charges:  $Therapeutic Activity: 8-22 mins                    Chesley Noon, PTA 10/02/20, 9:29 AM

## 2020-10-02 NOTE — Care Management Important Message (Signed)
Important Message  Patient Details  Name: Angelica Juarez MRN: [id number] Date of Birth: 03-Jul-1941   Medicare Important Message Given:  Yes     Dannette Barbara 10/02/2020, 11:23 AM

## 2020-10-03 DIAGNOSIS — L03116 Cellulitis of left lower limb: Secondary | ICD-10-CM | POA: Diagnosis not present

## 2020-10-03 DIAGNOSIS — N179 Acute kidney failure, unspecified: Secondary | ICD-10-CM | POA: Diagnosis not present

## 2020-10-03 DIAGNOSIS — I5033 Acute on chronic diastolic (congestive) heart failure: Secondary | ICD-10-CM | POA: Diagnosis not present

## 2020-10-03 LAB — CULTURE, BLOOD (ROUTINE X 2)
Culture: NO GROWTH
Culture: NO GROWTH
Special Requests: ADEQUATE

## 2020-10-03 LAB — CBC
HCT: 34.4 % — ABNORMAL LOW (ref 36.0–46.0)
Hemoglobin: 10.8 g/dL — ABNORMAL LOW (ref 12.0–15.0)
MCH: 27.6 pg (ref 26.0–34.0)
MCHC: 31.4 g/dL (ref 30.0–36.0)
MCV: 88 fL (ref 80.0–100.0)
Platelets: 654 10*3/uL — ABNORMAL HIGH (ref 150–400)
RBC: 3.91 MIL/uL (ref 3.87–5.11)
RDW: 15.7 % — ABNORMAL HIGH (ref 11.5–15.5)
WBC: 18.3 10*3/uL — ABNORMAL HIGH (ref 4.0–10.5)
nRBC: 0.1 % (ref 0.0–0.2)

## 2020-10-03 LAB — AEROBIC CULTURE W GRAM STAIN (SUPERFICIAL SPECIMEN)
Culture: NORMAL
Gram Stain: NONE SEEN

## 2020-10-03 LAB — BASIC METABOLIC PANEL
Anion gap: 10 (ref 5–15)
BUN: 45 mg/dL — ABNORMAL HIGH (ref 8–23)
CO2: 25 mmol/L (ref 22–32)
Calcium: 8.7 mg/dL — ABNORMAL LOW (ref 8.9–10.3)
Chloride: 100 mmol/L (ref 98–111)
Creatinine, Ser: 2.54 mg/dL — ABNORMAL HIGH (ref 0.44–1.00)
GFR, Estimated: 19 mL/min — ABNORMAL LOW (ref >60)
Glucose, Bld: 101 mg/dL — ABNORMAL HIGH (ref 70–99)
Potassium: 3.7 mmol/L (ref 3.5–5.1)
Sodium: 135 mmol/L (ref 135–145)

## 2020-10-03 LAB — GLUCOSE, CAPILLARY
Glucose-Capillary: 86 mg/dL (ref 70–99)
Glucose-Capillary: 96 mg/dL (ref 70–99)
Glucose-Capillary: 94 mg/dL (ref 70–99)
Glucose-Capillary: 94 mg/dL (ref 70–99)

## 2020-10-03 LAB — CORTISOL-AM, BLOOD: Cortisol - AM: 12.9 ug/dL (ref 6.7–22.6)

## 2020-10-03 MED ORDER — POLYETHYLENE GLYCOL 3350 17 G PO PACK
17.0000 g | PACK | Freq: Every day | ORAL | Status: DC
  Administered 2020-10-03 – 2020-10-04 (×2): 17 g via ORAL
  Filled 2020-10-03 (×3): qty 1

## 2020-10-03 MED ORDER — PIPERACILLIN-TAZOBACTAM 3.375 G IVPB
3.3750 g | Freq: Three times a day (TID) | INTRAVENOUS | Status: DC
  Administered 2020-10-03 – 2020-10-06 (×8): 3.375 g via INTRAVENOUS
  Filled 2020-10-03 (×8): qty 50

## 2020-10-03 NOTE — Progress Notes (Signed)
PROGRESS NOTE    Angelica Juarez  ERD:[id number] DOB: Feb 13, 1941 DOA: 09/28/2020 PCP: Sofie Hartigan, MD   Assessment & Plan:   Principal Problem:   Cellulitis of left leg Active Problems:   COPD with asthma ([place])   Essential hypertension   Seizure disorder ([place])   Type 2 diabetes mellitus with microalbuminuria, without long-term current use of insulin (HCC)   Rash and nonspecific skin eruption   Acute on chronic diastolic CHF (congestive heart failure) (HCC)   Left leg cellulitis: vs pemphigoid vs drug induced as per ID. Continue on IV zosyn as per ID. D/c IV vanco as Cr was previously trending up. Lamotrigine will be tapered off as per ID.  Will need to see dermatology as an outpatient for biopsy or general surg can also do biopsy of skin lesions as an outpatient as well. Can d/c to SNF when ok with ID   Leukocytosis: secondary to infection. Continue on IV abxs  Thrombocytosis: etiology unclear. Will continue to monitor   AKI: Cr is trending down from day prior. IV vanco was d/c. Will continue to hold home dose of ACE-I, lasix & HCTZ   Acute on chronic diastolic CHF: hold lasix secondary to AKI. Strict I/Os and daily weights. Echo on 06/12/2020 showed EF estimated at 50 to 55%, normal LV diastolic parameters, mild to moderate mitral regurgitation, mild to moderate TR.   Hypotension: etiology unclear. Continue on midodrine   COPD with asthma: w/o exacerbation. Continue on bronchodilators and encourage incentive spirometry.   Chronic hypoxic respiratory failure: continue on supplemental oxygen, currently 3L [place] which is pt's baseline   Seizure disorder: continue on keppra. Taper off of lamotrigine as per ID  DVT prophylaxis: eliquis  Code Status: full  Family Communication: Disposition Plan: likely d/c to SNF   Status is: Inpatient  Remains inpatient appropriate because:Ongoing diagnostic testing needed not appropriate for outpatient work up, Unsafe d/c plan and IV  treatments appropriate due to intensity of illness or inability to take PO   Dispo: The patient is from: Home              Anticipated d/c is to:  SNF               Anticipated d/c date is: when it is ok w/ ID               Patient currently is not medically stable to d/c.     Consultants:      Procedures:    Antimicrobials: zosyn    Subjective: Pt c/o fatigue    Objective: Vitals:   10/02/20 2327 10/03/20 0513 10/03/20 0521 10/03/20 0541  BP: (!) 128/54 (!) 149/70    Pulse: 76 93    Resp: 20 20    Temp: (!) 97.5 F (36.4 C) 97.7 F (36.5 C)    TempSrc: Oral Oral    SpO2: 93% 92%  94%  Weight:   107.4 kg   Height:        Intake/Output Summary (Last 24 hours) at 10/03/2020 0754 Last data filed at 10/03/2020 0300 Gross per 24 hour  Intake 160.45 ml  Output 400 ml  Net -239.55 ml   Filed Weights   10/01/20 0418 10/02/20 0511 10/03/20 0521  Weight: 104.8 kg 107.2 kg 107.4 kg    Examination:  General exam: Appears calm & comfortable  Respiratory system: diminished breath sounds b/l. No rales  Cardiovascular system: S1/S2+. No rubs or gallops  Gastrointestinal system: Abdomen is  soft, obese, non-tender and hypoactive bowel sounds  Central nervous system: Alert and oriented. Moves all 4 extremities  Psychiatry: Judgement and insight appear normal. Flat mood and affect  Skin:  B/l LE are dressed and dressing is C/D/I      Data Reviewed: I have personally reviewed following labs and imaging studies  CBC: Recent Labs  Lab 09/28/20 1525 09/28/20 1525 09/28/20 2149 09/30/20 0557 10/01/20 0841 10/02/20 0521 10/03/20 0518  WBC 13.7*   < > 14.4* 16.3* 17.5* 17.5* 18.3*  NEUTROABS 11.6*  --   --  13.6*  --   --   --   HGB 11.3*   < > 11.3* 11.1* 10.7* 10.5* 10.8*  HCT 36.2   < > 36.5 36.6 34.5* 33.6* 34.4*  MCV 90.5   < > 90.6 92.0 91.0 89.8 88.0  PLT 545*   < > 571* 531* 543* 566* 654*   < > = values in this interval not displayed.   Basic  Metabolic Panel: Recent Labs  Lab 09/28/20 1530 09/28/20 2149 09/30/20 0557 10/01/20 0624 10/01/20 0841 10/02/20 0521 10/03/20 0518  NA 139  --  139  --  137 136 135  K 3.6  --  3.6  --  3.6 3.7 3.7  CL 99  --  99  --  99 99 100  CO2 28  --  28  --  25 23 25   GLUCOSE 135*  --  146*  --  126* 122* 101*  BUN 16  --  25*  --  35* 42* 45*  CREATININE 0.93   < > 1.67* 2.34* 2.49* 2.76* 2.54*  CALCIUM 9.8  --  9.4  --  9.0 9.0 8.7*  MG  --   --  1.8  --   --   --   --    < > = values in this interval not displayed.   GFR: Estimated Creatinine Clearance: 23.4 mL/min (A) (by C-G formula based on SCr of 2.54 mg/dL (H)). Liver Function Tests: No results for input(s): AST, ALT, ALKPHOS, BILITOT, PROT, ALBUMIN in the last 168 hours. No results for input(s): LIPASE, AMYLASE in the last 168 hours. No results for input(s): AMMONIA in the last 168 hours. Coagulation Profile: No results for input(s): INR, PROTIME in the last 168 hours. Cardiac Enzymes: No results for input(s): CKTOTAL, CKMB, CKMBINDEX, TROPONINI in the last 168 hours. BNP (last 3 results) No results for input(s): PROBNP in the last 8760 hours. HbA1C: No results for input(s): HGBA1C in the last 72 hours. CBG: Recent Labs  Lab 10/01/20 2120 10/02/20 0741 10/02/20 1203 10/02/20 1620 10/02/20 2114  GLUCAP 116* 114* 182* 95 88   Lipid Profile: No results for input(s): CHOL, HDL, LDLCALC, TRIG, CHOLHDL, LDLDIRECT in the last 72 hours. Thyroid Function Tests: No results for input(s): TSH, T4TOTAL, FREET4, T3FREE, THYROIDAB in the last 72 hours. Anemia Panel: No results for input(s): VITAMINB12, FOLATE, FERRITIN, TIBC, IRON, RETICCTPCT in the last 72 hours. Sepsis Labs: Recent Labs  Lab 09/28/20 1525 09/28/20 2149  LATICACIDVEN 1.2 1.6    Recent Results (from the past 240 hour(s))  Culture, blood (Routine x 2)     Status: None   Collection Time: 09/28/20  3:29 PM   Specimen: BLOOD  Result Value Ref Range Status     Specimen Description BLOOD RIGHT ANTECUBITAL  Final   Special Requests   Final    BOTTLES DRAWN AEROBIC AND ANAEROBIC Blood Culture adequate volume   Culture  Final    NO GROWTH 5 DAYS Performed at [facility], [address] [place], [place] [address]    Report Status 10/03/2020 FINAL  Final  Culture, blood (Routine x 2)     Status: None   Collection Time: 09/28/20  5:47 PM   Specimen: BLOOD  Result Value Ref Range Status   Specimen Description BLOOD LEFT ANTECUBITAL  Final   Special Requests   Final    BOTTLES DRAWN AEROBIC AND ANAEROBIC Blood Culture results may not be optimal due to an excessive volume of blood received in culture bottles   Culture   Final    NO GROWTH 5 DAYS Performed at [facility], [address] [place], [place] [address]    Report Status 10/03/2020 FINAL  Final  Respiratory Panel by RT PCR (Flu A&B, Covid) - Nasopharyngeal Swab     Status: None   Collection Time: 09/28/20  6:37 PM   Specimen: Nasopharyngeal Swab  Result Value Ref Range Status   SARS Coronavirus 2 by RT PCR NEGATIVE NEGATIVE Final    Comment: (NOTE) SARS-CoV-2 target nucleic acids are NOT DETECTED.  The SARS-CoV-2 RNA is generally detectable in upper respiratoy specimens during the acute phase of infection. The lowest concentration of SARS-CoV-2 viral copies this assay can detect is 131 copies/mL. A negative result does not preclude SARS-Cov-2 infection and should not be used as the sole basis for treatment or other patient management decisions. A negative result may occur with  improper specimen collection/handling, submission of specimen other than nasopharyngeal swab, presence of viral mutation(s) within the areas targeted by this assay, and inadequate number of viral copies (<131 copies/mL). A negative result must be combined with clinical observations, patient history, and epidemiological information. The expected result is Negative.  Fact Sheet for  Patients:  PinkCheek.be  Fact Sheet for Healthcare Providers:  GravelBags.it  This test is no t yet approved or cleared by the [place] FDA and  has been authorized for detection and/or diagnosis of SARS-CoV-2 by FDA under an Emergency Use Authorization (EUA). This EUA will remain  in effect (meaning this test can be used) for the duration of the COVID-19 declaration under Section 564(b)(1) of the Act, 21 U.S.C. section 360bbb-3(b)(1), unless the authorization is terminated or revoked sooner.     Influenza A by PCR NEGATIVE NEGATIVE Final   Influenza B by PCR NEGATIVE NEGATIVE Final    Comment: (NOTE) The Xpert Xpress SARS-CoV-2/FLU/RSV assay is intended as an aid in  the diagnosis of influenza from Nasopharyngeal swab specimens and  should not be used as a sole basis for treatment. Nasal washings and  aspirates are unacceptable for Xpert Xpress SARS-CoV-2/FLU/RSV  testing.  Fact Sheet for Patients: PinkCheek.be  Fact Sheet for Healthcare Providers: GravelBags.it  This test is not yet approved or cleared by the [place] FDA and  has been authorized for detection and/or diagnosis of SARS-CoV-2 by  FDA under an Emergency Use Authorization (EUA). This EUA will remain  in effect (meaning this test can be used) for the duration of the  Covid-19 declaration under Section 564(b)(1) of the Act, 21  U.S.C. section 360bbb-3(b)(1), unless the authorization is  terminated or revoked. Performed at [facility], [address] [place], [place] [address]   Aerobic Culture (superficial specimen)     Status: None (Preliminary result)   Collection Time: 09/28/20 11:22 PM   Specimen: SHOULDER  Result Value Ref Range Status   Specimen Description   Final  SHOULDER LEFT Performed at [facility], [place] [address] [place], [place] [address]     Special Requests   Final    NONE Performed at [facility], [place], [place] [address]    Gram Stain NO WBC SEEN NO ORGANISMS SEEN   Final   Culture   Final    NORMAL SKIN FLORA TO DATE Performed at [facility], [place] [address] [place], [place] [address]    Report Status PENDING  Incomplete  Aerobic Culture (superficial specimen)     Status: Abnormal (Preliminary result)   Collection Time: 09/28/20 11:22 PM   Specimen: Leg  Result Value Ref Range Status   Specimen Description   Final    LEG LEFT Performed at [facility], [address] [place], [place] [address]    Special Requests   Final    NONE Performed at [facility], [place], [place] [address]    Gram Stain NO WBC SEEN FEW GRAM POSITIVE RODS   Final   Culture (A)  Final    MULTIPLE ORGANISMS PRESENT, NONE PREDOMINANT NO STAPHYLOCOCCUS AUREUS ISOLATED NO GROUP A STREP (S.PYOGENES) ISOLATED RARE GRAM NEGATIVE RODS CULTURE REINCUBATED FOR BETTER GROWTH Performed at [facility], [place] [address] [place],  [address]    Report Status PENDING  Incomplete  Resp Panel by RT-PCR (Flu A&B, Covid) Nasopharyngeal Swab     Status: None   Collection Time: 10/01/20  6:35 PM   Specimen: Nasopharyngeal Swab; Nasopharyngeal(NP) swabs in vial transport medium  Result Value Ref Range Status   SARS Coronavirus 2 by RT PCR NEGATIVE NEGATIVE Final    Comment: (NOTE) SARS-CoV-2 target nucleic acids are NOT DETECTED.  The SARS-CoV-2 RNA is generally detectable in upper respiratory specimens during the acute phase of infection. The lowest concentration of SARS-CoV-2 viral copies this assay can detect is 138 copies/mL. A negative result does not preclude SARS-Cov-2 infection and should not be used as the sole basis for treatment or other patient management decisions. A negative result may occur with  improper specimen collection/handling, submission of  specimen other than nasopharyngeal swab, presence of viral mutation(s) within the areas targeted by this assay, and inadequate number of viral copies(<138 copies/mL). A negative result must be combined with clinical observations, patient history, and epidemiological information. The expected result is Negative.  Fact Sheet for Patients:  EntrepreneurPulse.com.au  Fact Sheet for Healthcare Providers:  IncredibleEmployment.be  This test is no t yet approved or cleared by the [place] FDA and  has been authorized for detection and/or diagnosis of SARS-CoV-2 by FDA under an Emergency Use Authorization (EUA). This EUA will remain  in effect (meaning this test can be used) for the duration of the COVID-19 declaration under Section 564(b)(1) of the Act, 21 U.S.C.section 360bbb-3(b)(1), unless the authorization is terminated  or revoked sooner.       Influenza A by PCR NEGATIVE NEGATIVE Final   Influenza B by PCR NEGATIVE NEGATIVE Final    Comment: (NOTE) The Xpert Xpress SARS-CoV-2/FLU/RSV plus assay is intended as an aid in the diagnosis of influenza from Nasopharyngeal swab specimens and should not be used as a sole basis for treatment. Nasal washings and aspirates are unacceptable for Xpert Xpress SARS-CoV-2/FLU/RSV testing.  Fact Sheet for Patients: EntrepreneurPulse.com.au  Fact Sheet for Healthcare Providers: IncredibleEmployment.be  This test is not yet approved or cleared by the [place] FDA and has been authorized for detection and/or  diagnosis of SARS-CoV-2 by FDA under an Emergency Use Authorization (EUA). This EUA will remain in effect (meaning this test can be used) for the duration of the COVID-19 declaration under Section 564(b)(1) of the Act, 21 U.S.C. section 360bbb-3(b)(1), unless the authorization is terminated or revoked.  Performed at [facility], [address] [place], [place] [address]   Aerobic Culture (superficial specimen)     Status: None (Preliminary result)   Collection Time: 10/02/20  4:01 PM   Specimen: Wound  Result Value Ref Range Status   Specimen Description   Final    WOUND LEFT LEG Performed at [facility], [address], [place], [place] [address]    Special Requests   Final    NONE Performed at [facility], [address] [place], [place] [address]    Gram Stain   Final    NO WBC SEEN NO ORGANISMS SEEN Performed at [facility], [place] [address], [place], [place] [address]    Culture PENDING  Incomplete   Report Status PENDING  Incomplete         Radiology Studies: No results found.      Scheduled Meds: . apixaban  5 mg Oral BID  . vitamin C  500 mg Oral BID  . aspirin EC  81 mg Oral Daily  . atorvastatin  40 mg Oral Daily  . B-complex with vitamin C   Oral Daily  . docusate sodium  200 mg Oral BID  . fluticasone  2 spray Each Nare Daily  . hydroxyurea  500 mg Oral Once per day on Sun Tue Wed Thu Fri Sat  . [START ON 10/05/2020] hydroxyurea  500 mg Oral 2 times per day every 7 days  . influenza vaccine adjuvanted  0.5 mL Intramuscular Tomorrow-1000  . insulin aspart  0-15 Units Subcutaneous TID WC  . lamoTRIgine  50 mg Oral BID   Followed by  . [START ON 10/04/2020] lamoTRIgine  25 mg Oral BID  . levETIRAcetam  500 mg Oral BID  . midodrine  10 mg Oral TID WC  . montelukast  10 mg Oral Daily  . NIFEdipine  30 mg Oral Daily  . sodium chloride flush  10-40 mL Intracatheter Q12H  . traZODone  50 mg Oral QHS  . umeclidinium-vilanterol  1 puff Inhalation Daily   Continuous Infusions: . sodium chloride 5 mL/hr at 10/03/20 0300  . piperacillin-tazobactam (ZOSYN)  IV 3.375 g (10/03/20 0518)     LOS: 4 days    Time spent: 30 mins     Wyvonnia Dusky, MD [facility] Pager [phone]  If 7PM-7AM, please contact night-coverage 10/03/2020, 7:54 AM

## 2020-10-03 NOTE — Progress Notes (Signed)
Mobility Specialist - Progress Note   10/03/20 1220  Mobility  Activity Transferred:  Bed to chair  Level of Assistance Moderate assist, patient does 50-74%  Assistive Device Front wheel walker  Distance Ambulated (ft) 3 ft  Mobility Response Tolerated well  Mobility performed by Mobility specialist  $Mobility charge 1 Mobility    Pre-mobility: 82 HR, 91% SpO2 During mobility: 99 HR, 87% SpO2 Post-mobility: 83 HR, 90% SpO2   Pt laying in bed upon arrival. Pt agreed to session. Pt needed min. Assist getting to EOB. Pt dangled EOB for a couple of minutes, while mobility specialist set-up room to prepare for transfer. Attempted to S2S 2x to RW. 1st attempt to S2S, bed was at it's lowest level and pt rocked for momentum but failed to fully stand w/ max. assist. 2nd attempt, bed was raised and pt was able to fully stand w/ mod assist. Pt transferred from bed to chair w/ mod. Assist. Pt unsteady. VC required for sequencing, hand placement, and guidance. Noted pt was SOB. O2 desat to 87%. Pt on 3L O2 [place]. O2 sat 87-91% t/o session. Per discussion w/ nurse, pt's O2 typically sits low and she's chronically on 3L O2 [place] at home. Overall, pt tolerated session well. Pt left sitting on recliner w/ alarm set. All needs placed in reach. Nurse was notified.     Zakye Baby Mobility Specialist  10/03/20, 12:45 PM

## 2020-10-03 NOTE — Consult Note (Signed)
PHARMACY NOTE:  ANTIMICROBIAL RENAL DOSAGE ADJUSTMENT  Current antimicrobial regimen includes a mismatch between antimicrobial dosage and estimated renal function.  As per policy approved by the Pharmacy & Therapeutics and Medical Executive Committees, the antimicrobial dosage will be adjusted accordingly.  Current antimicrobial dosage:  Pip/tazo 3.375 g IV q12H  Indication: cellulitis  Renal Function:  Estimated Creatinine Clearance: 23.4 mL/min (A) (by C-G formula based on SCr of 2.54 mg/dL (H)).     Antimicrobial dosage has been changed to:  Pip/tazo 3.375g IV q8H  Additional comments: Creatinine improving.    Thank you for allowing pharmacy to be a part of this patient's care.  Oswald Hillock, [facility] 10/03/2020 8:20 AM

## 2020-10-04 ENCOUNTER — Inpatient Hospital Stay: Payer: Medicare Other

## 2020-10-04 DIAGNOSIS — I10 Essential (primary) hypertension: Secondary | ICD-10-CM | POA: Diagnosis not present

## 2020-10-04 DIAGNOSIS — G40909 Epilepsy, unspecified, not intractable, without status epilepticus: Secondary | ICD-10-CM | POA: Diagnosis not present

## 2020-10-04 DIAGNOSIS — R21 Rash and other nonspecific skin eruption: Secondary | ICD-10-CM | POA: Diagnosis not present

## 2020-10-04 DIAGNOSIS — L03116 Cellulitis of left lower limb: Secondary | ICD-10-CM | POA: Diagnosis not present

## 2020-10-04 LAB — CBC
HCT: 32.4 % — ABNORMAL LOW (ref 36.0–46.0)
Hemoglobin: 10.2 g/dL — ABNORMAL LOW (ref 12.0–15.0)
MCH: 27.7 pg (ref 26.0–34.0)
MCHC: 31.5 g/dL (ref 30.0–36.0)
MCV: 88 fL (ref 80.0–100.0)
Platelets: 552 10*3/uL — ABNORMAL HIGH (ref 150–400)
RBC: 3.68 MIL/uL — ABNORMAL LOW (ref 3.87–5.11)
RDW: 15.6 % — ABNORMAL HIGH (ref 11.5–15.5)
WBC: 13.4 10*3/uL — ABNORMAL HIGH (ref 4.0–10.5)
nRBC: 0 % (ref 0.0–0.2)

## 2020-10-04 LAB — BASIC METABOLIC PANEL
Anion gap: 9 (ref 5–15)
BUN: 44 mg/dL — ABNORMAL HIGH (ref 8–23)
CO2: 24 mmol/L (ref 22–32)
Calcium: 8.9 mg/dL (ref 8.9–10.3)
Chloride: 104 mmol/L (ref 98–111)
Creatinine, Ser: 2.29 mg/dL — ABNORMAL HIGH (ref 0.44–1.00)
GFR, Estimated: 21 mL/min — ABNORMAL LOW (ref >60)
Glucose, Bld: 104 mg/dL — ABNORMAL HIGH (ref 70–99)
Potassium: 3.5 mmol/L (ref 3.5–5.1)
Sodium: 137 mmol/L (ref 135–145)

## 2020-10-04 LAB — HEPATITIS PANEL, ACUTE
HCV Ab: NONREACTIVE
Hep A IgM: NONREACTIVE
Hep B C IgM: NONREACTIVE
Hepatitis B Surface Ag: NONREACTIVE

## 2020-10-04 LAB — GLUCOSE, CAPILLARY
Glucose-Capillary: 95 mg/dL (ref 70–99)
Glucose-Capillary: 118 mg/dL — ABNORMAL HIGH (ref 70–99)
Glucose-Capillary: 116 mg/dL — ABNORMAL HIGH (ref 70–99)
Glucose-Capillary: 107 mg/dL — ABNORMAL HIGH (ref 70–99)

## 2020-10-04 LAB — PROTEIN, URINE, RANDOM: Total Protein, Urine: 125 mg/dL

## 2020-10-04 LAB — SODIUM, URINE, RANDOM: Sodium, Ur: 39 mmol/L

## 2020-10-04 LAB — CREATININE, URINE, RANDOM: Creatinine, Urine: 116 mg/dL

## 2020-10-04 NOTE — Consult Note (Signed)
Angelica Juarez MRN: [id number] DOB/AGE: [birth year] 79 y.o. Primary Care Physician:Angelica Juarez, Angelica Noa, MD Admit date: 09/28/2020 Chief Complaint:  Chief Complaint  Patient presents with   Wound Infection   HPI: Patient is a 79 year old Caucasian female with a past medical history of diabetes mellitus type 2, hypertension, atrial fibrillation-on Eliquis, microscopic colitis who came to the ER on November 15 with chief complaint of left leg swelling.  History of present illness dated back to few days before patient's presentation with patient started having increasing swelling in the left area then developed progressive increasing right edema and later sloughing off of skin with ulcerative area. Patient upon presentation was thought to have cellulitis and was started on IV vancomycin Patient thereafter was transitioned to Zosyn  Patient went seen today on second floor because of rising serum creatinine/acute kidney injury  Patient is well-known to Dr. Holley Raring. Patient main concern continues to be pain in left leg but says it is better than before. Patient feels her infection is better in her leg No complaint of nausea/vomiting/diarrhea No complaint of hematuria No complaint of chest pain/shortness of breath No complaint of recent sick contacts      Past Medical History:  Diagnosis Date   Allergy    Seasonal   Arthritis    Cancer ([facility])    fallopian tubes- radiation   Chronic kidney disease    chronic renal insufficiency   Colon polyps 05/21/14   COPD (chronic obstructive pulmonary disease) (HCC)    Diabetes mellitus without complication (HCC)    Dyspnea    Fracture closed, humerus, shaft    right    History of kidney stones    40 years ago   Hyperlipidemia 04/30/14   Hypertension    Impingement syndrome of right shoulder 12/29/15   Personal history of radiation therapy    Pneumonia    hx   Rotator cuff tear 04/21/16   right   Seizures ([facility])    Sleep apnea          Family History  Problem Relation Age of Onset   Breast cancer Paternal Aunt 4   Diabetes Father    Heart disease Father    Breast cancer Daughter 21    Social History:  reports that she quit smoking about 15 years ago. She has a 40.00 pack-year smoking history. She has never used smokeless tobacco. She reports that she does not drink alcohol and does not use drugs.   Allergies:  Allergies  Allergen Reactions   Iodinated Diagnostic Agents Anaphylaxis    Other reaction(s): Other (See Comments) Throat swells and extreme hives   Latex Itching   Phenobarbital Hives   Tape Rash    silicones    Medications Prior to Admission  Medication Sig Dispense Refill   albuterol (ACCUNEB) 1.25 MG/3ML nebulizer solution Take 3 mLs (1.25 mg total) by nebulization every 6 (six) hours as needed. wheezing 75 mL 3   apixaban (ELIQUIS) 5 MG TABS tablet Take 1 tablet (5 mg total) by mouth 2 (two) times daily. 60 tablet 0   aspirin 81 MG tablet Take 81 mg by mouth daily.     atorvastatin (LIPITOR) 40 MG tablet Take 40 mg by mouth daily.     B Complex Vitamins (VITAMIN B COMPLEX PO) Take 1 tablet by mouth daily.     budesonide (ENTOCORT EC) 3 MG 24 hr capsule Take 3 capsules (9 mg total) by mouth daily. (Patient not taking: Reported on 09/29/2020) 90 capsule 2  diphenhydrAMINE (BENADRYL) 25 mg capsule Take 25 mg by mouth daily.     fluticasone (FLONASE) 50 MCG/ACT nasal spray Place 2 sprays into both nostrils daily. 16 g 2   furosemide (LASIX) 20 MG tablet Take 1 tablet (20 mg total) by mouth 2 (two) times daily. (Patient not taking: Reported on 09/29/2020) 60 tablet 0   furosemide (LASIX) 40 MG tablet Take 1 tablet (40 mg total) by mouth daily. 7 tablet 0   hydroxyurea (HYDREA) 500 MG capsule Take 1 pill twice a day on Mondays only and 1 pill a day by mouth on Tuesdays-Sundays. May take with food to minimize GI side effects. 100 capsule 1   lamoTRIgine (LAMICTAL) 25 MG  tablet Patient is unsure of where she is in this taper process but is certain she has not completed it     levETIRAcetam (KEPPRA) 500 MG tablet Take 500 mg by mouth 2 (two) times daily.     mesalamine (LIALDA) 1.2 g EC tablet Take 2 tablets (2.4 g total) by mouth 2 (two) times daily. (Patient not taking: Reported on 09/29/2020) 120 tablet 1   metFORMIN (GLUCOPHAGE) 500 MG tablet Take 500 mg by mouth 2 (two) times daily with a meal.     montelukast (SINGULAIR) 10 MG tablet TAKE 1 TABLET BY MOUTH EVERY DAY (Patient taking differently: Take 10 mg by mouth daily. ) 90 tablet 0   Multiple Vitamin (MULTI-VITAMINS) TABS Take 1 tablet by mouth daily.     NIFEdipine (NIFEDICAL XL) 30 MG 24 hr tablet Take 30 mg by mouth daily.     OXYGEN Inhale 2 L into the lungs.     potassium chloride SA (KLOR-CON) 20 MEQ tablet Take 20 mEq by mouth daily.     sitaGLIPtin (JANUVIA) 25 MG tablet Take 1 tablet by mouth daily.     traZODone (DESYREL) 100 MG tablet Take by mouth.     umeclidinium-vilanterol (ANORO ELLIPTA) 62.5-25 MCG/INH AEPB Inhale 1 puff into the lungs daily. 180 each 3   valsartan-hydrochlorothiazide (DIOVAN-HCT) 320-25 MG tablet Take 1 tablet by mouth daily.  0   VENTOLIN HFA 108 (90 Base) MCG/ACT inhaler INHALE 2 PUFFS INTO THE LUNGS EVERY 6 HOURS AS NEEDED (Patient taking differently: Inhale 2 puffs into the lungs every 6 (six) hours as needed for wheezing or shortness of breath. ) 54 g 1   vitamin C (ASCORBIC ACID) 500 MG tablet Take 500 mg by mouth 2 (two) times daily.         OQH:UTMLY from the symptoms mentioned above,there are no other symptoms referable to all systems reviewed.   apixaban  5 mg Oral BID   vitamin C  500 mg Oral BID   aspirin EC  81 mg Oral Daily   atorvastatin  40 mg Oral Daily   B-complex with vitamin C   Oral Daily   docusate sodium  200 mg Oral BID   fluticasone  2 spray Each Nare Daily   hydroxyurea  500 mg Oral Once per day on Sun Tue Wed Thu  Fri Sat   [START ON 10/05/2020] hydroxyurea  500 mg Oral 2 times per day every 7 days   influenza vaccine adjuvanted  0.5 mL Intramuscular Tomorrow-1000   insulin aspart  0-15 Units Subcutaneous TID WC   lamoTRIgine  25 mg Oral BID   levETIRAcetam  500 mg Oral BID   midodrine  10 mg Oral TID WC   montelukast  10 mg Oral Daily   NIFEdipine  30  mg Oral Daily   polyethylene glycol  17 g Oral Daily   sodium chloride flush  10-40 mL Intracatheter Q12H   traZODone  50 mg Oral QHS   umeclidinium-vilanterol  1 puff Inhalation Daily         UMP:NTIRW from the symptoms mentioned above,there are no other symptoms referable to all systems reviewed.  Physical Exam: Vital signs in last 24 hours: Temp:  [97.6 F (36.4 C)-98.4 F (36.9 C)] 97.6 F (36.4 C) (11/21 0604) Pulse Rate:  [74-82] 82 (11/21 0604) Resp:  [18-24] 20 (11/21 0604) BP: (97-153)/(43-80) 153/54 (11/21 0604) SpO2:  [93 %-98 %] 96 % (11/21 0604) Weight change:  Last BM Date: 09/28/20  Intake/Output from previous day: 11/20 0701 - 11/21 0700 In: 1081 [P.O.:1020; I.V.:11; IV Piggyback:50] Out: 1020 [Urine:1020] Total I/O In: 120 [P.O.:120] Out: -    Physical Exam: General- pt is awake,alert, oriented to time place and person Resp- No acute REsp distress, CTA B/L NO Rhonchi CVS- S1S2 regular in rate and rhythm GIT- BS+, soft, NT, ND EXT- 1+ LE Edema, NO Cyanosis Left leg erythema better CNS- CN 2-12 grossly intact. Moving all 4 extremities Psych- normal mood and affect    Lab Results: CBC Recent Labs    10/03/20 0518 10/04/20 0440  WBC 18.3* 13.4*  HGB 10.8* 10.2*  HCT 34.4* 32.4*  PLT 654* 552*    BMET Recent Labs    10/03/20 0518 10/04/20 0440  NA 135 137  K 3.7 3.5  CL 100 104  CO2 25 24  GLUCOSE 101* 104*  BUN 45* 44*  CREATININE 2.54* 2.29*  CALCIUM 8.7* 8.9   Creatinine trend 2021 0.9==>2.8==>2.3 July admission 0.9--1.1 2013 admission 0.7--1.5  Patient UA shows  proteinuria going back to 2017 Patient has microalbuminuria going back to 2015   MICRO Recent Results (from the past 240 hour(s))  Culture, blood (Routine x 2)     Status: None   Collection Time: 09/28/20  3:29 PM   Specimen: BLOOD  Result Value Ref Range Status   Specimen Description BLOOD RIGHT ANTECUBITAL  Final   Special Requests   Final    BOTTLES DRAWN AEROBIC AND ANAEROBIC Blood Culture adequate volume   Culture   Final    NO GROWTH 5 DAYS Performed at [facility], [address] [place], [place] [address]    Report Status 10/03/2020 FINAL  Final  Culture, blood (Routine x 2)     Status: None   Collection Time: 09/28/20  5:47 PM   Specimen: BLOOD  Result Value Ref Range Status   Specimen Description BLOOD LEFT ANTECUBITAL  Final   Special Requests   Final    BOTTLES DRAWN AEROBIC AND ANAEROBIC Blood Culture results may not be optimal due to an excessive volume of blood received in culture bottles   Culture   Final    NO GROWTH 5 DAYS Performed at [facility], [address] [place], [place] [address]    Report Status 10/03/2020 FINAL  Final  Respiratory Panel by RT PCR (Flu A&B, Covid) - Nasopharyngeal Swab     Status: None   Collection Time: 09/28/20  6:37 PM   Specimen: Nasopharyngeal Swab  Result Value Ref Range Status   SARS Coronavirus 2 by RT PCR NEGATIVE NEGATIVE Final    Comment: (NOTE) SARS-CoV-2 target nucleic acids are NOT DETECTED.  The SARS-CoV-2 RNA is generally detectable in upper respiratoy specimens during the acute phase of infection. The lowest concentration of SARS-CoV-2  viral copies this assay can detect is 131 copies/mL. A negative result does not preclude SARS-Cov-2 infection and should not be used as the sole basis for treatment or other patient management decisions. A negative result may occur with  improper specimen collection/handling, submission of specimen other than nasopharyngeal swab, presence of viral  mutation(s) within the areas targeted by this assay, and inadequate number of viral copies (<131 copies/mL). A negative result must be combined with clinical observations, patient history, and epidemiological information. The expected result is Negative.  Fact Sheet for Patients:  PinkCheek.be  Fact Sheet for Healthcare Providers:  GravelBags.it  This test is no t yet approved or cleared by the [place] FDA and  has been authorized for detection and/or diagnosis of SARS-CoV-2 by FDA under an Emergency Use Authorization (EUA). This EUA will remain  in effect (meaning this test can be used) for the duration of the COVID-19 declaration under Section 564(b)(1) of the Act, 21 U.S.C. section 360bbb-3(b)(1), unless the authorization is terminated or revoked sooner.     Influenza A by PCR NEGATIVE NEGATIVE Final   Influenza B by PCR NEGATIVE NEGATIVE Final    Comment: (NOTE) The Xpert Xpress SARS-CoV-2/FLU/RSV assay is intended as an aid in  the diagnosis of influenza from Nasopharyngeal swab specimens and  should not be used as a sole basis for treatment. Nasal washings and  aspirates are unacceptable for Xpert Xpress SARS-CoV-2/FLU/RSV  testing.  Fact Sheet for Patients: PinkCheek.be  Fact Sheet for Healthcare Providers: GravelBags.it  This test is not yet approved or cleared by the [place] FDA and  has been authorized for detection and/or diagnosis of SARS-CoV-2 by  FDA under an Emergency Use Authorization (EUA). This EUA will remain  in effect (meaning this test can be used) for the duration of the  Covid-19 declaration under Section 564(b)(1) of the Act, 21  U.S.C. section 360bbb-3(b)(1), unless the authorization is  terminated or revoked. Performed at [facility], [address] [place], [place] [address]   Aerobic Culture (superficial  specimen)     Status: None   Collection Time: 09/28/20 11:22 PM   Specimen: SHOULDER  Result Value Ref Range Status   Specimen Description   Final    SHOULDER LEFT Performed at [facility], [place] [address] [place], [place] [address]    Special Requests   Final    NONE Performed at [facility], [place], [place] [address]    Gram Stain NO WBC SEEN NO ORGANISMS SEEN   Final   Culture   Final    NORMAL SKIN FLORA Performed at [facility], [place] [address], [place], [place] [address]    Report Status 10/03/2020 FINAL  Final  Aerobic Culture (superficial specimen)     Status: None (Preliminary result)   Collection Time: 09/28/20 11:22 PM   Specimen: Leg  Result Value Ref Range Status   Specimen Description   Final    LEG LEFT Performed at [facility], [address] [place], [place] [address]    Special Requests   Final    NONE Performed at [facility], [address] [place], [place] [address]    Gram Stain NO WBC SEEN FEW GRAM POSITIVE RODS   Final   Culture   Final    RARE ENTEROBACTER CLOACAE WITHIN MIXED CULTURE Performed at [facility], [place] [address] [place], [place] [address]    Report Status PENDING  Incomplete  Resp Panel by RT-PCR (Flu A&B, Covid) Nasopharyngeal Swab     Status: None   Collection Time: 10/01/20  6:35 PM   Specimen: Nasopharyngeal Swab; Nasopharyngeal(NP) swabs in vial transport medium  Result Value Ref Range Status   SARS Coronavirus 2 by RT PCR NEGATIVE NEGATIVE Final    Comment: (NOTE) SARS-CoV-2 target nucleic acids are NOT DETECTED.  The SARS-CoV-2 RNA is generally detectable in upper respiratory specimens during the acute phase of infection. The lowest concentration of SARS-CoV-2 viral copies this assay can detect is 138 copies/mL. A negative result does not preclude SARS-Cov-2 infection and should not be used as the sole basis for treatment or other patient  management decisions. A negative result may occur with  improper specimen collection/handling, submission of specimen other than nasopharyngeal swab, presence of viral mutation(s) within the areas targeted by this assay, and inadequate number of viral copies(<138 copies/mL). A negative result must be combined with clinical observations, patient history, and epidemiological information. The expected result is Negative.  Fact Sheet for Patients:  EntrepreneurPulse.com.au  Fact Sheet for Healthcare Providers:  IncredibleEmployment.be  This test is no t yet approved or cleared by the [place] FDA and  has been authorized for detection and/or diagnosis of SARS-CoV-2 by FDA under an Emergency Use Authorization (EUA). This EUA will remain  in effect (meaning this test can be used) for the duration of the COVID-19 declaration under Section 564(b)(1) of the Act, 21 U.S.C.section 360bbb-3(b)(1), unless the authorization is terminated  or revoked sooner.       Influenza A by PCR NEGATIVE NEGATIVE Final   Influenza B by PCR NEGATIVE NEGATIVE Final    Comment: (NOTE) The Xpert Xpress SARS-CoV-2/FLU/RSV plus assay is intended as an aid in the diagnosis of influenza from Nasopharyngeal swab specimens and should not be used as a sole basis for treatment. Nasal washings and aspirates are unacceptable for Xpert Xpress SARS-CoV-2/FLU/RSV testing.  Fact Sheet for Patients: EntrepreneurPulse.com.au  Fact Sheet for Healthcare Providers: IncredibleEmployment.be  This test is not yet approved or cleared by the [place] FDA and has been authorized for detection and/or diagnosis of SARS-CoV-2 by FDA under an Emergency Use Authorization (EUA). This EUA will remain in effect (meaning this test can be used) for the duration of the COVID-19 declaration under Section 564(b)(1) of the Act, 21 U.S.C. section 360bbb-3(b)(1),  unless the authorization is terminated or revoked.  Performed at [facility], [address] [place], [place] [address]   Aerobic Culture (superficial specimen)     Status: None (Preliminary result)   Collection Time: 10/02/20  4:01 PM   Specimen: Wound  Result Value Ref Range Status   Specimen Description   Final    WOUND LEFT LEG Performed at [facility], [address], [place], [place] [address]    Special Requests   Final    NONE Performed at [facility], [place], [place] [address]    Gram Stain NO WBC SEEN NO ORGANISMS SEEN   Final   Culture   Final    TOO YOUNG TO READ Performed at [facility], [place] [address] [place], [place] [address]    Report Status PENDING  Incomplete      Lab Results  Component Value Date   CALCIUM 8.9 10/04/2020   PHOS 2.2 (L) 09/17/2012      Impression:   Patient is a 79 year old Caucasian female with a past medical history of polycythemia, COPD, microscopic  colitis, CHF, diabetes mellitus who was admitted on November 15 with chief complaint of left lower extremity swelling/cellulitis  1)Renal   Patient has acute kidney injury Acute injury secondary to ATN/AIN/questionable Vanco toxicity  Data in favor of ATN-patient was on ARB as an outpatient and developed hypotension as an inpatient  AIN-patient does have rash  Questionable Vanco toxicity   Patient has AKI on CKD 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 Agree with asking for urine eosinophils. We will ask for complements level We will ask for autoimmune work-up  2) hypotension Patient is on midodrine   3)Anemia of critical illness/polycythemia  Patient has history of polycythemia Patient hemoglobin has been trending low since July-multiple  admissions since July Patient last anemia work-up done on July 29 showed Results for IONIA, SCHEY (MRN [id number]) as of 10/04/2020 10:00  Ref. Range 06/11/2020 10:03  Iron Latest Ref Range: 28 - 170 ug/dL 60  UIBC Latest Units: ug/dL 256  TIBC Latest Ref Range: 250 - 450 ug/dL 316  Saturation Ratios Latest Ref Range: 10.4 - 31.8 % 19  Ferritin Latest Ref Range: 11 - 307 ng/mL 107  Folate Latest Ref Range: >5.9 ng/mL 34.0     4) hypophosphatemia Patient last phosphorus level is low We will recheck    5) cellulitis Patient is on antibiotics Primary team/infectious disease team following PMD following  6) Electrolytes   normokalemic NOrmonatremic   7)Acid base Co2 at goal  8) Proteinuria Will ask for spot sample  No need for RAS blockers in acute state.    Plan:  We will ask for FENA We will ask for renal ultrasound We will ask for complements We will ask for hepatology work-up We will ask for ANA Agree with holding ras blockers     Theordore Cisnero s [facility] 10/04/2020, 10:02 AM

## 2020-10-04 NOTE — Progress Notes (Signed)
PROGRESS NOTE    Angelica Juarez  DJS:[id number] DOB: [birth year] DOA: 09/28/2020 PCP: Sofie Hartigan, MD   Assessment & Plan:   Principal Problem:   Cellulitis of left leg Active Problems:   COPD with asthma ([place])   Essential hypertension   Seizure disorder ([place])   Type 2 diabetes mellitus with microalbuminuria, without long-term current use of insulin (HCC)   Rash and nonspecific skin eruption   Acute on chronic diastolic CHF (congestive heart failure) (HCC)   Left leg cellulitis: vs pemphigoid vs drug induced as per ID. Continue on IV zosyn as per ID. D/c IV vanco as Cr was previously trending up. Lamotrigine will be tapered off as per ID.  Will need to see dermatology as an outpatient for biopsy or general surg can also do biopsy of skin lesions as an outpatient as well. Can d/c to SNF when ok with ID. Wound c/s growing Enterobacter and Klebsiella.  Leukocytosis: secondary to infection. Continue on IV Zosyn per ID  Thrombocytosis: etiology unclear. Will continue to monitor. 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 Holley Raring: Cr is slowly improving. IV vanco was d/c. Will continue to hold home dose of ACE-I, lasix & HCTZ. Nephrology consultation  Acute on chronic diastolic CHF: hold lasix secondary to AKI. Strict I/Os and daily weights. Echo on 06/12/2020 showed EF estimated at 50 to 55%, normal LV diastolic pa rameters, mild to moderate mitral regurgitation, mild to moderate TR.   Hypotension: etiology unclear. Continue on midodrine   COPD with asthma: w/o exacerbation. Continue on bronchodilators and encourage incentive spirometry.   Chronic hypoxic respiratory failure: continue on supplemental oxygen, currently 3L [place] which is pt's baseline   Seizure disorder: continue on keppra. Taper off of lamotrigine as per ID  DVT prophylaxis: eliquis  Code Status: full  Family Communication: Disposition Plan:  likely d/c to SNF/[facility]  Status is: Inpatient  Remains inpatient appropriate because:Ongoing diagnostic testing needed not appropriate for outpatient work up, Unsafe d/c plan and IV treatments appropriate due to intensity of illness or inability to take PO   Dispo: The patient is from: Home              Anticipated d/c is to:  SNF               Anticipated d/c date is: when it is ok w/ ID               Patient currently is not medically stable to d/c.     Consultants:   ID  Nephrology   Procedures:    Antimicrobials: zosyn    Subjective: Just returned after getting renal ultrasound. Denies any complaints  Objective: Vitals:   10/03/20 1955 10/03/20 2300 10/04/20 0604 10/04/20 1212  BP: 140/60 125/80 (!) 153/54 (!) 134/46  Pulse: 74 75 82 77  Resp: 19 18 20 20   Temp: 98.4 F (36.9 C) 98 F (36.7 C) 97.6 F (36.4 C) 98.3 F (36.8 C)  TempSrc: Oral Oral  Oral  SpO2: 98% 97% 96% 90%  Weight:      Height:        Intake/Output Summary (Last 24 hours) at 10/04/2020 1504 Last data filed at 10/04/2020 1300 Gross per 24 hour  Intake 820 ml  Output 1020 ml  Net -200 ml   Filed Weights   10/01/20 0418 10/02/20 0511 10/03/20 0521  Weight: 104.8 kg 107.2 kg 107.4 kg  Examination:  General exam: Appears calm & comfortable  Respiratory system: diminished breath sounds b/l. No rales  Cardiovascular system: S1/S2+. No rubs or gallops  Gastrointestinal system: Abdomen is soft, obese, non-tender and hypoactive bowel sounds  Central nervous system: Alert and oriented. Moves all 4 extremities  Psychiatry: Judgement and insight appear normal. Flat mood and affect  Skin:  B/l LE are dressed and dressing is C/D/I      Data Reviewed: I have personally reviewed following labs and imaging studies  CBC: Recent Labs  Lab 09/28/20 1525 09/28/20 2149 09/30/20 0557 10/01/20 0841 10/02/20 0521 10/03/20 0518 10/04/20 0440  WBC 13.7*   < > 16.3* 17.5* 17.5* 18.3*  13.4*  NEUTROABS 11.6*  --  13.6*  --   --   --   --   HGB 11.3*   < > 11.1* 10.7* 10.5* 10.8* 10.2*  HCT 36.2   < > 36.6 34.5* 33.6* 34.4* 32.4*  MCV 90.5   < > 92.0 91.0 89.8 88.0 88.0  PLT 545*   < > 531* 543* 566* 654* 552*   < > = values in this interval not displayed.   Basic Metabolic Panel: Recent Labs  Lab 09/30/20 0557 09/30/20 0557 10/01/20 0624 10/01/20 0841 10/02/20 0521 10/03/20 0518 10/04/20 0440  NA 139  --   --  137 136 135 137  K 3.6  --   --  3.6 3.7 3.7 3.5  CL 99  --   --  99 99 100 104  CO2 28  --   --  25 23 25 24   GLUCOSE 146*  --   --  126* 122* 101* 104*  BUN 25*  --   --  35* 42* 45* 44*  CREATININE 1.67*   < > 2.34* 2.49* 2.76* 2.54* 2.29*  CALCIUM 9.4  --   --  9.0 9.0 8.7* 8.9  MG 1.8  --   --   --   --   --   --    < > = values in this interval not displayed.   GFR: Estimated Creatinine Clearance: 26 mL/min (A) (by C-G formula based on SCr of 2.29 mg/dL (H)). Liver Function Tests: No results for input(s): AST, ALT, ALKPHOS, BILITOT, PROT, ALBUMIN in the last 168 hours. No results for input(s): LIPASE, AMYLASE in the last 168 hours. No results for input(s): AMMONIA in the last 168 hours. Coagulation Profile: No results for input(s): INR, PROTIME in the last 168 hours. Cardiac Enzymes: No results for input(s): CKTOTAL, CKMB, CKMBINDEX, TROPONINI in the last 168 hours. BNP (last 3 results) No results for input(s): PROBNP in the last 8760 hours. HbA1C: No results for input(s): HGBA1C in the last 72 hours. CBG: Recent Labs  Lab 10/03/20 1145 10/03/20 1654 10/03/20 2026 10/04/20 0737 10/04/20 1207  GLUCAP 96 94 94 95 118*   Lipid Profile: No results for input(s): CHOL, HDL, LDLCALC, TRIG, CHOLHDL, LDLDIRECT in the last 72 hours. Thyroid Function Tests: No results for input(s): TSH, T4TOTAL, FREET4, T3FREE, THYROIDAB in the last 72 hours. Anemia Panel: No results for input(s): VITAMINB12, FOLATE, FERRITIN, TIBC, IRON, RETICCTPCT in the  last 72 hours. Sepsis Labs: Recent Labs  Lab 09/28/20 1525 09/28/20 2149  LATICACIDVEN 1.2 1.6    Recent Results (from the past 240 hour(s))  Culture, blood (Routine x 2)     Status: None   Collection Time: 09/28/20  3:29 PM   Specimen: BLOOD  Result Value Ref Range Status   Specimen Description  BLOOD RIGHT ANTECUBITAL  Final   Special Requests   Final    BOTTLES DRAWN AEROBIC AND ANAEROBIC Blood Culture adequate volume   Culture   Final    NO GROWTH 5 DAYS Performed at [facility], [address] [place], [place] [address]    Report Status 10/03/2020 FINAL  Final  Culture, blood (Routine x 2)     Status: None   Collection Time: 09/28/20  5:47 PM   Specimen: BLOOD  Result Value Ref Range Status   Specimen Description BLOOD LEFT ANTECUBITAL  Final   Special Requests   Final    BOTTLES DRAWN AEROBIC AND ANAEROBIC Blood Culture results may not be optimal due to an excessive volume of blood received in culture bottles   Culture   Final    NO GROWTH 5 DAYS Performed at [facility], [address], [place], [place] [address]    Report Status 10/03/2020 FINAL  Final  Respiratory Panel by RT PCR (Flu A&B, Covid) - Nasopharyngeal Swab     Status: None   Collection Time: 09/28/20  6:37 PM   Specimen: Nasopharyngeal Swab  Result Value Ref Range Status   SARS Coronavirus 2 by RT PCR NEGATIVE NEGATIVE Final    Comment: (NOTE) SARS-CoV-2 target nucleic acids are NOT DETECTED.  The SARS-CoV-2 RNA is generally detectable in upper respiratoy specimens during the acute phase of infection. The lowest concentration of SARS-CoV-2 viral copies this assay can detect is 131 copies/mL. A negative result does not preclude SARS-Cov-2 infection and should not be used as the sole basis for treatment or other patient management decisions. A negative result may occur with  improper specimen collection/handling, submission of specimen other than nasopharyngeal swab, presence  of viral mutation(s) within the areas targeted by this assay, and inadequate number of viral copies (<131 copies/mL). A negative result must be combined with clinical observations, patient history, and epidemiological information. The expected result is Negative.  Fact Sheet for Patients:  PinkCheek.be  Fact Sheet for Healthcare Providers:  GravelBags.it  This test is no t yet approved or cleared by the [place] FDA and  has been authorized for detection and/or diagnosis of SARS-CoV-2 by FDA under an Emergency Use Authorization (EUA). This EUA will remain  in effect (meaning this test can be used) for the duration of the COVID-19 declaration under Section 564(b)(1) of the Act, 21 U.S.C. section 360bbb-3(b)(1), unless the authorization is terminated or revoked sooner.     Influenza A by PCR NEGATIVE NEGATIVE Final   Influenza B by PCR NEGATIVE NEGATIVE Final    Comment: (NOTE) The Xpert Xpress SARS-CoV-2/FLU/RSV assay is intended as an aid in  the diagnosis of influenza from Nasopharyngeal swab specimens and  should not be used as a sole basis for treatment. Nasal washings and  aspirates are unacceptable for Xpert Xpress SARS-CoV-2/FLU/RSV  testing.  Fact Sheet for Patients: PinkCheek.be  Fact Sheet for Healthcare Providers: GravelBags.it  This test is not yet approved or cleared by the [place] FDA and  has been authorized for detection and/or diagnosis of SARS-CoV-2 by  FDA under an Emergency Use Authorization (EUA). This EUA will remain  in effect (meaning this test can be used) for the duration of the  Covid-19 declaration under Section 564(b)(1) of the Act, 21  U.S.C. section 360bbb-3(b)(1), unless the authorization is  terminated or revoked. Performed at [facility], [address], [place], [place] [address]   Aerobic Culture  (superficial specimen)  Status: None   Collection Time: 09/28/20 11:22 PM   Specimen: SHOULDER  Result Value Ref Range Status   Specimen Description   Final    SHOULDER LEFT Performed at [facility], [place] [address] [place], [place] [address]    Special Requests   Final    NONE Performed at [facility], [place], [place] [address]    Gram Stain NO WBC SEEN NO ORGANISMS SEEN   Final   Culture   Final    NORMAL SKIN FLORA Performed at [facility], [place] [address], [place], [place] [address]    Report Status 10/03/2020 FINAL  Final  Aerobic Culture (superficial specimen)     Status: None (Preliminary result)   Collection Time: 09/28/20 11:22 PM   Specimen: Leg  Result Value Ref Range Status   Specimen Description   Final    LEG LEFT Performed at [facility], [address] [place], [place] [address]    Special Requests   Final    NONE Performed at [facility], [address] [place], [place] [address]    Gram Stain   Final    NO WBC SEEN FEW GRAM POSITIVE RODS Performed at [facility], [place] [address], [place], [place] [address]    Culture   Final    RARE ENTEROBACTER CLOACAE RARE KLEBSIELLA PNEUMONIAE    Report Status PENDING  Incomplete   Organism ID, Bacteria ENTEROBACTER CLOACAE  Final      Susceptibility   Enterobacter cloacae - MIC*    CEFAZOLIN >=64 RESISTANT Resistant     CEFEPIME <=0.12 SENSITIVE Sensitive     CEFTAZIDIME <=1 SENSITIVE Sensitive     CIPROFLOXACIN <=0.25 SENSITIVE Sensitive     GENTAMICIN <=1 SENSITIVE Sensitive     IMIPENEM <=0.25 SENSITIVE Sensitive     TRIMETH/SULFA <=20 SENSITIVE Sensitive     PIP/TAZO <=4 SENSITIVE Sensitive     * RARE ENTEROBACTER CLOACAE  Resp Panel by RT-PCR (Flu A&B, Covid) Nasopharyngeal Swab     Status: None   Collection Time: 10/01/20  6:35 PM   Specimen: Nasopharyngeal Swab; Nasopharyngeal(NP) swabs in vial transport medium  Result Value  Ref Range Status   SARS Coronavirus 2 by RT PCR NEGATIVE NEGATIVE Final    Comment: (NOTE) SARS-CoV-2 target nucleic acids are NOT DETECTED.  The SARS-CoV-2 RNA is generally detectable in upper respiratory specimens during the acute phase of infection. The lowest concentration of SARS-CoV-2 viral copies this assay can detect is 138 copies/mL. A negative result does not preclude SARS-Cov-2 infection and should not be used as the sole basis for treatment or other patient management decisions. A negative result may occur with  improper specimen collection/handling, submission of specimen other than nasopharyngeal swab, presence of viral mutation(s) within the areas targeted by this assay, and inadequate number of viral copies(<138 copies/mL). A negative result must be combined with clinical observations, patient history, and epidemiological information. The expected result is Negative.  Fact Sheet for Patients:  EntrepreneurPulse.com.au  Fact Sheet for Healthcare Providers:  IncredibleEmployment.be  This test is no t yet approved or cleared by the [place] FDA and  has been authorized for detection and/or diagnosis of SARS-CoV-2 by FDA under an Emergency Use Authorization (EUA). This EUA will remain  in effect (meaning this test can be used) for the duration of the COVID-19 declaration under Section 564(b)(1) of the Act, 21 U.S.C.section 360bbb-3(b)(1), unless the authorization is terminated  or  revoked sooner.       Influenza A by PCR NEGATIVE NEGATIVE Final   Influenza B by PCR NEGATIVE NEGATIVE Final    Comment: (NOTE) The Xpert Xpress SARS-CoV-2/FLU/RSV plus assay is intended as an aid in the diagnosis of influenza from Nasopharyngeal swab specimens and should not be used as a sole basis for treatment. Nasal washings and aspirates are unacceptable for Xpert Xpress SARS-CoV-2/FLU/RSV testing.  Fact Sheet for  Patients: EntrepreneurPulse.com.au  Fact Sheet for Healthcare Providers: IncredibleEmployment.be  This test is not yet approved or cleared by the [place] FDA and has been authorized for detection and/or diagnosis of SARS-CoV-2 by FDA under an Emergency Use Authorization (EUA). This EUA will remain in effect (meaning this test can be used) for the duration of the COVID-19 declaration under Section 564(b)(1) of the Act, 21 U.S.C. section 360bbb-3(b)(1), unless the authorization is terminated or revoked.  Performed at [facility], [address] [place], [place] [address]   Aerobic Culture (superficial specimen)     Status: Abnormal (Preliminary result)   Collection Time: 10/02/20  4:01 PM   Specimen: Wound  Result Value Ref Range Status   Specimen Description   Final    WOUND LEFT LEG Performed at [facility], [address], [place], [place] [address]    Special Requests   Final    NONE Performed at [facility], [address] [place], [place] [address]    Gram Stain   Final    NO WBC SEEN NO ORGANISMS SEEN Performed at [facility], [place] [address], [place], [place] [address]    Culture MULTIPLE ORGANISMS PRESENT, NONE PREDOMINANT (A)  Final   Report Status PENDING  Incomplete         Radiology Studies: No results found.      Scheduled Meds: . apixaban  5 mg Oral BID  . vitamin C  500 mg Oral BID  . aspirin EC  81 mg Oral Daily  . atorvastatin  40 mg Oral Daily  . B-complex with vitamin C   Oral Daily  . docusate sodium  200 mg Oral BID  . fluticasone  2 spray Each Nare Daily  . hydroxyurea  500 mg Oral Once per day on Sun Tue Wed Thu Fri Sat  . [START ON 10/05/2020] hydroxyurea  500 mg Oral 2 times per day every 7 days  . insulin aspart  0-15 Units Subcutaneous TID WC  . lamoTRIgine  25 mg Oral BID  . levETIRAcetam  500 mg Oral BID  . midodrine  10 mg Oral TID WC  .  montelukast  10 mg Oral Daily  . NIFEdipine  30 mg Oral Daily  . polyethylene glycol  17 g Oral Daily  . sodium chloride flush  10-40 mL Intracatheter Q12H  . traZODone  50 mg Oral QHS  . umeclidinium-vilanterol  1 puff Inhalation Daily   Continuous Infusions: . sodium chloride Stopped (10/03/20 0518)  . piperacillin-tazobactam (ZOSYN)  IV 3.375 g (10/04/20 1351)     LOS: 5 days    Time spent: 30 mins     Max Sane, MD [facility] Pager [phone]  If 7PM-7AM, please contact night-coverage 10/04/2020, 3:04 PM

## 2020-10-05 DIAGNOSIS — L03116 Cellulitis of left lower limb: Secondary | ICD-10-CM | POA: Diagnosis not present

## 2020-10-05 DIAGNOSIS — G40909 Epilepsy, unspecified, not intractable, without status epilepticus: Secondary | ICD-10-CM | POA: Diagnosis not present

## 2020-10-05 DIAGNOSIS — R21 Rash and other nonspecific skin eruption: Secondary | ICD-10-CM | POA: Diagnosis not present

## 2020-10-05 DIAGNOSIS — I10 Essential (primary) hypertension: Secondary | ICD-10-CM | POA: Diagnosis not present

## 2020-10-05 LAB — CBC
HCT: 32.2 % — ABNORMAL LOW (ref 36.0–46.0)
Hemoglobin: 10.5 g/dL — ABNORMAL LOW (ref 12.0–15.0)
MCH: 28.6 pg (ref 26.0–34.0)
MCHC: 32.6 g/dL (ref 30.0–36.0)
MCV: 87.7 fL (ref 80.0–100.0)
Platelets: 563 10*3/uL — ABNORMAL HIGH (ref 150–400)
RBC: 3.67 MIL/uL — ABNORMAL LOW (ref 3.87–5.11)
RDW: 15.9 % — ABNORMAL HIGH (ref 11.5–15.5)
WBC: 13.1 10*3/uL — ABNORMAL HIGH (ref 4.0–10.5)
nRBC: 0 % (ref 0.0–0.2)

## 2020-10-05 LAB — AEROBIC CULTURE W GRAM STAIN (SUPERFICIAL SPECIMEN)
Gram Stain: NONE SEEN
Gram Stain: NONE SEEN

## 2020-10-05 LAB — BASIC METABOLIC PANEL
Anion gap: 10 (ref 5–15)
BUN: 40 mg/dL — ABNORMAL HIGH (ref 8–23)
CO2: 26 mmol/L (ref 22–32)
Calcium: 9 mg/dL (ref 8.9–10.3)
Chloride: 104 mmol/L (ref 98–111)
Creatinine, Ser: 1.94 mg/dL — ABNORMAL HIGH (ref 0.44–1.00)
GFR, Estimated: 26 mL/min — ABNORMAL LOW (ref >60)
Glucose, Bld: 108 mg/dL — ABNORMAL HIGH (ref 70–99)
Potassium: 3.4 mmol/L — ABNORMAL LOW (ref 3.5–5.1)
Sodium: 140 mmol/L (ref 135–145)

## 2020-10-05 LAB — HEPATIC FUNCTION PANEL
ALT: 13 U/L (ref 0–44)
AST: 23 U/L (ref 15–41)
Albumin: 2.9 g/dL — ABNORMAL LOW (ref 3.5–5.0)
Alkaline Phosphatase: 83 U/L (ref 38–126)
Bilirubin, Direct: <0.1 mg/dL (ref 0.0–0.2)
Total Bilirubin: 0.4 mg/dL (ref 0.3–1.2)
Total Protein: 5.8 g/dL — ABNORMAL LOW (ref 6.5–8.1)

## 2020-10-05 LAB — GLUCOSE, CAPILLARY
Glucose-Capillary: 112 mg/dL — ABNORMAL HIGH (ref 70–99)
Glucose-Capillary: 135 mg/dL — ABNORMAL HIGH (ref 70–99)
Glucose-Capillary: 89 mg/dL (ref 70–99)
Glucose-Capillary: 119 mg/dL — ABNORMAL HIGH (ref 70–99)

## 2020-10-05 LAB — ANTI-DNA ANTIBODY, DOUBLE-STRANDED: ds DNA Ab: <1 IU/mL (ref 0–9)

## 2020-10-05 LAB — C3 COMPLEMENT: C3 Complement: 116 mg/dL (ref 82–167)

## 2020-10-05 LAB — PHOSPHORUS: Phosphorus: 3.4 mg/dL (ref 2.5–4.6)

## 2020-10-05 LAB — C4 COMPLEMENT: Complement C4, Body Fluid: 23 mg/dL (ref 12–38)

## 2020-10-05 MED ORDER — SALINE SPRAY 0.65 % NA SOLN
1.0000 | NASAL | Status: DC | PRN
  Administered 2020-10-05: 1 via NASAL
  Filled 2020-10-05: qty 44

## 2020-10-05 MED ORDER — PHENYLEPHRINE HCL 0.5 % NA SOLN
2.0000 [drp] | Freq: Once | NASAL | Status: AC
  Administered 2020-10-06: 2 [drp] via NASAL
  Filled 2020-10-05: qty 15

## 2020-10-05 MED ORDER — POTASSIUM CHLORIDE CRYS ER 20 MEQ PO TBCR
40.0000 meq | EXTENDED_RELEASE_TABLET | Freq: Once | ORAL | Status: AC
  Administered 2020-10-05: 40 meq via ORAL
  Filled 2020-10-05: qty 2

## 2020-10-05 MED ORDER — LAMOTRIGINE 25 MG PO TABS
25.0000 mg | ORAL_TABLET | Freq: Every day | ORAL | Status: AC
  Administered 2020-10-06: 25 mg via ORAL
  Filled 2020-10-05: qty 1

## 2020-10-05 NOTE — Progress Notes (Signed)
[facility]  ROUNDING NOTE   Subjective:  Patient is a 79 year old Caucasian female with a past medical history of diabetes mellitus type 2, hypertension, atrial fibrillation-on Eliquis, microscopic colitis who presented to the ED with chief complaint of left leg swelling.  Nephrology was consulted for AKI.   Objective:  Vital signs in last 24 hours:  Temp:  [97.5 F (36.4 C)-97.7 F (36.5 C)] 97.5 F (36.4 C) (11/22 1152) Pulse Rate:  [78-90] 88 (11/22 1152) Resp:  [20] 20 (11/22 1152) BP: (99-179)/(50-68) 137/54 (11/22 1152) SpO2:  [88 %-93 %] 93 % (11/22 1152) Weight:  [104.3 kg] 104.3 kg (11/22 0400)  Weight change:  Filed Weights   10/03/20 0521 10/04/20 2305 10/05/20 0400  Weight: 107.4 kg 104.3 kg 104.3 kg    Intake/Output: I/O last 3 completed shifts: In: 680 [P.O.:480; IV Piggyback:200] Out: 750 [Urine:750]   Intake/Output this shift:  No intake/output data recorded.  Physical Exam: General: No acute distress,appears  pleasant and comfortable  Head: Normocephalic, atraumatic. Moist oral mucosal membranes  Eyes: Anicteric  Lungs:  Clear to auscultation bilaterally  Heart: Regular rate and rhythm  Abdomen:  Soft, nontender, non distended  Extremities:  Lt Leg 1+ erythematous, Rt leg trace  Neurologic: Awake,alert, oriented,  moving all four extremities  Skin: Lt leg erythematous, warm and tender    Basic Metabolic Panel: Recent Labs  Lab 09/30/20 0557 10/01/20 0624 10/01/20 0841 10/01/20 0841 10/02/20 0521 10/02/20 0521 10/03/20 0518 10/04/20 0440 10/05/20 0348  NA 139  --  137  --  136  --  135 137 140  K 3.6  --  3.6  --  3.7  --  3.7 3.5 3.4*  CL 99  --  99  --  99  --  100 104 104  CO2 28  --  25  --  23  --  25 24 26   GLUCOSE 146*  --  126*  --  122*  --  101* 104* 108*  BUN 25*  --  35*  --  42*  --  45* 44* 40*  CREATININE 1.67*   < > 2.49*  --  2.76*  --  2.54* 2.29* 1.94*  CALCIUM 9.4  --  9.0   < > 9.0   < > 8.7* 8.9 9.0   MG 1.8  --   --   --   --   --   --   --   --   PHOS  --   --   --   --   --   --   --   --  3.4   < > = values in this interval not displayed.    Liver Function Tests: No results for input(s): AST, ALT, ALKPHOS, BILITOT, PROT, ALBUMIN in the last 168 hours. No results for input(s): LIPASE, AMYLASE in the last 168 hours. No results for input(s): AMMONIA in the last 168 hours.  CBC: Recent Labs  Lab 09/28/20 1525 09/28/20 2149 09/30/20 0557 09/30/20 0557 10/01/20 0841 10/02/20 0521 10/03/20 0518 10/04/20 0440 10/05/20 0348  WBC 13.7*   < > 16.3*   < > 17.5* 17.5* 18.3* 13.4* 13.1*  NEUTROABS 11.6*  --  13.6*  --   --   --   --   --   --   HGB 11.3*   < > 11.1*   < > 10.7* 10.5* 10.8* 10.2* 10.5*  HCT 36.2   < > 36.6   < > 34.5*  33.6* 34.4* 32.4* 32.2*  MCV 90.5   < > 92.0   < > 91.0 89.8 88.0 88.0 87.7  PLT 545*   < > 531*   < > 543* 566* 654* 552* 563*   < > = values in this interval not displayed.    Cardiac Enzymes: No results for input(s): CKTOTAL, CKMB, CKMBINDEX, TROPONINI in the last 168 hours.  BNP: Invalid input(s): POCBNP  CBG: Recent Labs  Lab 10/04/20 1207 10/04/20 1717 10/04/20 2126 10/05/20 0754 10/05/20 1300  GLUCAP 118* 116* 107* 112* 135*    Microbiology: Results for orders placed or performed during the hospital encounter of 09/28/20  Culture, blood (Routine x 2)     Status: None   Collection Time: 09/28/20  3:29 PM   Specimen: BLOOD  Result Value Ref Range Status   Specimen Description BLOOD RIGHT ANTECUBITAL  Final   Special Requests   Final    BOTTLES DRAWN AEROBIC AND ANAEROBIC Blood Culture adequate volume   Culture   Final    NO GROWTH 5 DAYS Performed at [facility], [address] [place], [place] [address]    Report Status 10/03/2020 FINAL  Final  Culture, blood (Routine x 2)     Status: None   Collection Time: 09/28/20  5:47 PM   Specimen: BLOOD  Result Value Ref Range Status   Specimen Description BLOOD LEFT  ANTECUBITAL  Final   Special Requests   Final    BOTTLES DRAWN AEROBIC AND ANAEROBIC Blood Culture results may not be optimal due to an excessive volume of blood received in culture bottles   Culture   Final    NO GROWTH 5 DAYS Performed at [facility], [address] [place], [place] [address]    Report Status 10/03/2020 FINAL  Final  Respiratory Panel by RT PCR (Flu A&B, Covid) - Nasopharyngeal Swab     Status: None   Collection Time: 09/28/20  6:37 PM   Specimen: Nasopharyngeal Swab  Result Value Ref Range Status   SARS Coronavirus 2 by RT PCR NEGATIVE NEGATIVE Final    Comment: (NOTE) SARS-CoV-2 target nucleic acids are NOT DETECTED.  The SARS-CoV-2 RNA is generally detectable in upper respiratoy specimens during the acute phase of infection. The lowest concentration of SARS-CoV-2 viral copies this assay can detect is 131 copies/mL. A negative result does not preclude SARS-Cov-2 infection and should not be used as the sole basis for treatment or other patient management decisions. A negative result may occur with  improper specimen collection/handling, submission of specimen other than nasopharyngeal swab, presence of viral mutation(s) within the areas targeted by this assay, and inadequate number of viral copies (<131 copies/mL). A negative result must be combined with clinical observations, patient history, and epidemiological information. The expected result is Negative.  Fact Sheet for Patients:  PinkCheek.be  Fact Sheet for Healthcare Providers:  GravelBags.it  This test is no t yet approved or cleared by the [place] FDA and  has been authorized for detection and/or diagnosis of SARS-CoV-2 by FDA under an Emergency Use Authorization (EUA). This EUA will remain  in effect (meaning this test can be used) for the duration of the COVID-19 declaration under Section 564(b)(1) of the Act, 21  U.S.C. section 360bbb-3(b)(1), unless the authorization is terminated or revoked sooner.     Influenza A by PCR NEGATIVE NEGATIVE Final   Influenza B by PCR NEGATIVE NEGATIVE Final    Comment: (NOTE) The Xpert Xpress SARS-CoV-2/FLU/RSV assay is intended  as an aid in  the diagnosis of influenza from Nasopharyngeal swab specimens and  should not be used as a sole basis for treatment. Nasal washings and  aspirates are unacceptable for Xpert Xpress SARS-CoV-2/FLU/RSV  testing.  Fact Sheet for Patients: PinkCheek.be  Fact Sheet for Healthcare Providers: GravelBags.it  This test is not yet approved or cleared by the [place] FDA and  has been authorized for detection and/or diagnosis of SARS-CoV-2 by  FDA under an Emergency Use Authorization (EUA). This EUA will remain  in effect (meaning this test can be used) for the duration of the  Covid-19 declaration under Section 564(b)(1) of the Act, 21  U.S.C. section 360bbb-3(b)(1), unless the authorization is  terminated or revoked. Performed at [facility], [address] [place], [place] [address]   Aerobic Culture (superficial specimen)     Status: None   Collection Time: 09/28/20 11:22 PM   Specimen: SHOULDER  Result Value Ref Range Status   Specimen Description   Final    SHOULDER LEFT Performed at [facility], [place] [address] [place], [place] [address]    Special Requests   Final    NONE Performed at [facility], [place], [place] [address]    Gram Stain NO WBC SEEN NO ORGANISMS SEEN   Final   Culture   Final    NORMAL SKIN FLORA Performed at [facility], [place] [address] [place], [place] [address]    Report Status 10/03/2020 FINAL  Final  Aerobic Culture (superficial specimen)     Status: None   Collection Time: 09/28/20 11:22 PM   Specimen: Leg  Result Value Ref Range Status   Specimen Description    Final    LEG LEFT Performed at [facility], [address] [place], [place] [address]    Special Requests   Final    NONE Performed at [facility], [address] [place], [place] [address]    Gram Stain   Final    NO WBC SEEN FEW GRAM POSITIVE RODS Performed at [facility], [place] [address], [place],  [address]    Culture   Final    RARE ENTEROBACTER CLOACAE RARE KLEBSIELLA PNEUMONIAE    Report Status 10/05/2020 FINAL  Final   Organism ID, Bacteria ENTEROBACTER CLOACAE  Final   Organism ID, Bacteria KLEBSIELLA PNEUMONIAE  Final      Susceptibility   Enterobacter cloacae - MIC*    CEFAZOLIN >=64 RESISTANT Resistant     CEFEPIME <=0.12 SENSITIVE Sensitive     CEFTAZIDIME <=1 SENSITIVE Sensitive     CIPROFLOXACIN <=0.25 SENSITIVE Sensitive     GENTAMICIN <=1 SENSITIVE Sensitive     IMIPENEM <=0.25 SENSITIVE Sensitive     TRIMETH/SULFA <=20 SENSITIVE Sensitive     PIP/TAZO <=4 SENSITIVE Sensitive     * RARE ENTEROBACTER CLOACAE   Klebsiella pneumoniae - MIC*    AMPICILLIN >=32 RESISTANT Resistant     CEFAZOLIN <=4 SENSITIVE Sensitive     CEFEPIME <=0.12 SENSITIVE Sensitive     CEFTAZIDIME <=1 SENSITIVE Sensitive     CEFTRIAXONE <=0.25 SENSITIVE Sensitive     CIPROFLOXACIN <=0.25 SENSITIVE Sensitive     GENTAMICIN <=1 SENSITIVE Sensitive     IMIPENEM <=0.25 SENSITIVE Sensitive     TRIMETH/SULFA <=20 SENSITIVE Sensitive     AMPICILLIN/SULBACTAM 4 SENSITIVE Sensitive     PIP/TAZO <=4 SENSITIVE Sensitive     * RARE KLEBSIELLA PNEUMONIAE  Resp  Panel by RT-PCR (Flu A&B, Covid) Nasopharyngeal Swab     Status: None   Collection Time: 10/01/20  6:35 PM   Specimen: Nasopharyngeal Swab; Nasopharyngeal(NP) swabs in vial transport medium  Result Value Ref Range Status   SARS Coronavirus 2 by RT PCR NEGATIVE NEGATIVE Final    Comment: (NOTE) SARS-CoV-2 target nucleic acids are NOT DETECTED.  The SARS-CoV-2 RNA is generally detectable in upper  respiratory specimens during the acute phase of infection. The lowest concentration of SARS-CoV-2 viral copies this assay can detect is 138 copies/mL. A negative result does not preclude SARS-Cov-2 infection and should not be used as the sole basis for treatment or other patient management decisions. A negative result may occur with  improper specimen collection/handling, submission of specimen other than nasopharyngeal swab, presence of viral mutation(s) within the areas targeted by this assay, and inadequate number of viral copies(<138 copies/mL). A negative result must be combined with clinical observations, patient history, and epidemiological information. The expected result is Negative.  Fact Sheet for Patients:  EntrepreneurPulse.com.au  Fact Sheet for Healthcare Providers:  IncredibleEmployment.be  This test is no t yet approved or cleared by the [place] FDA and  has been authorized for detection and/or diagnosis of SARS-CoV-2 by FDA under an Emergency Use Authorization (EUA). This EUA will remain  in effect (meaning this test can be used) for the duration of the COVID-19 declaration under Section 564(b)(1) of the Act, 21 U.S.C.section 360bbb-3(b)(1), unless the authorization is terminated  or revoked sooner.       Influenza A by PCR NEGATIVE NEGATIVE Final   Influenza B by PCR NEGATIVE NEGATIVE Final    Comment: (NOTE) The Xpert Xpress SARS-CoV-2/FLU/RSV plus assay is intended as an aid in the diagnosis of influenza from Nasopharyngeal swab specimens and should not be used as a sole basis for treatment. Nasal washings and aspirates are unacceptable for Xpert Xpress SARS-CoV-2/FLU/RSV testing.  Fact Sheet for Patients: EntrepreneurPulse.com.au  Fact Sheet for Healthcare Providers: IncredibleEmployment.be  This test is not yet approved or cleared by the [place] FDA and has been  authorized for detection and/or diagnosis of SARS-CoV-2 by FDA under an Emergency Use Authorization (EUA). This EUA will remain in effect (meaning this test can be used) for the duration of the COVID-19 declaration under Section 564(b)(1) of the Act, 21 U.S.C. section 360bbb-3(b)(1), unless the authorization is terminated or revoked.  Performed at [facility], [address] [place], [place] [address]   Aerobic Culture (superficial specimen)     Status: Abnormal   Collection Time: 10/02/20  4:01 PM   Specimen: Wound  Result Value Ref Range Status   Specimen Description   Final    WOUND LEFT LEG Performed at [facility], [address], [place], [place] [address]    Special Requests   Final    NONE Performed at [facility], [address] [place], [place] [address]    Gram Stain   Final    NO WBC SEEN NO ORGANISMS SEEN Performed at [facility], [place] [address] [place], [place] [address]    Culture MULTIPLE ORGANISMS PRESENT, NONE PREDOMINANT (A)  Final   Report Status 10/05/2020 FINAL  Final    Coagulation Studies: No results for input(s): LABPROT, INR in the last 72 hours.  Urinalysis: No results for input(s): COLORURINE, LABSPEC, PHURINE, GLUCOSEU, HGBUR, BILIRUBINUR, KETONESUR, PROTEINUR, UROBILINOGEN, NITRITE, LEUKOCYTESUR in the last 72 hours.  Invalid input(s): APPERANCEUR    Imaging: US RENAL  Result Date: 10/04/2020 CLINICAL DATA:  ATN EXAM: RENAL / URINARY TRACT ULTRASOUND COMPLETE COMPARISON:  CT 01/16/2020, ultrasound 10/29/2012 FINDINGS: Right Kidney: Renal measurements: 11.2 x 6.1 x 6.3 cm = volume: 224 mL. Echogenicity within normal limits. No mass or hydronephrosis visualized. Left Kidney: Renal measurements: 10.7 x 7.1 x 6.3 cm = volume: 215 mL. Left kidney is poorly visible, particularly the lower pole. Cyst at the upper pole measuring 3.1 x 3.3 x 3.6 cm. No gross hydronephrosis. Bladder: Appears normal for degree  of bladder distention. Other: Cirrhotic morphology of the liver which appears echogenic and nodular. There is a small amount of ascites adjacent to the liver. There is a small left-sided pleural effusion. IMPRESSION: 1. Normal ultrasound appearance of the right kidney 2. Limited visibility of left kidney secondary to bowel gas and positioning. Simple appearing cyst upper pole left kidney. No hydronephrosis. 3. Cirrhotic morphology of the liver with small amount of ascites in the right upper quadrant. Left-sided pleural effusion. Electronically Signed   By: Donavan Foil M.D.   On: 10/04/2020 15:47     Medications:   . sodium chloride Stopped (10/03/20 0518)  . piperacillin-tazobactam (ZOSYN)  IV 3.375 g (10/05/20 0500)   . apixaban  5 mg Oral BID  . vitamin C  500 mg Oral BID  . aspirin EC  81 mg Oral Daily  . atorvastatin  40 mg Oral Daily  . B-complex with vitamin C   Oral Daily  . docusate sodium  200 mg Oral BID  . fluticasone  2 spray Each Nare Daily  . hydroxyurea  500 mg Oral Once per day on Sun Tue Wed Thu Fri Sat  . hydroxyurea  500 mg Oral 2 times per day every 7 days  . insulin aspart  0-15 Units Subcutaneous TID WC  . lamoTRIgine  25 mg Oral BID  . levETIRAcetam  500 mg Oral BID  . midodrine  10 mg Oral TID WC  . montelukast  10 mg Oral Daily  . NIFEdipine  30 mg Oral Daily  . polyethylene glycol  17 g Oral Daily  . sodium chloride flush  10-40 mL Intracatheter Q12H  . traZODone  50 mg Oral QHS  . umeclidinium-vilanterol  1 puff Inhalation Daily   sodium chloride, acetaminophen, albuterol, bisacodyl, morphine injection, sodium chloride flush  Assessment/ Plan:  [name] is a 79 y.o.  female a past medical history of polycythemia, COPD, microscopic colitis, CHF, diabetes mellitus who was admitted on November 15 with chief complaint of left lower extremity swelling/cellulitis  # Acute Kidney Injury on CKD  On CKD Stage iiiA  #Proteinuria ATN/AIN/Possible Vanco  Toxicity Lab Results  Component Value Date   CREATININE 1.94 (H) 10/05/2020   CREATININE 2.29 (H) 10/04/2020   CREATININE 2.54 (H) 10/03/2020  Creatinine is improving gradually and progressively No acute indication for dialysis  Encourage oral fluid intake   #Anemia of CKD  Hemoglobin 10.5 today,at goal  #Hypophosphatemia Phosphorus 3.4 today Will continue monitoring   #Hypokalemia Potassium 3.4  KLOR CON 40 meq PO  Today  #Left Leg Cellulitis  Patient is on Zosyn  Management per ID team   LOS: 6 Angelica Juarez 11/22/20211:22 PM

## 2020-10-05 NOTE — Consult Note (Signed)
Pharmacy Antibiotic Note  Angelica Juarez is a 79 y.o. female admitted on 09/28/2020 with left leg cellulitis. PMH DM, HTN, A.fib on Eliquis, microscopic colitis s/p budesonide therapy. Patient was started on cefepime and vancomycin then switched to doxy for worsening Scr and is now on Zosyn alone. Since admission Scr trending up (creatinine: 0.92>1.67>2.49). Pt reports that her legs were swollen and was given diuretic and her right leg swelling resolved but left leg swelling progressed and now is sloughing off skin with ulcerative area. The left leg started to get progressively red and worse from last week Wednesday. Pharmacy has been consulted for Zosyn dosing.  Day 7 total antibiotics, WBC 16.3>17.5>13.1, afebrile. Scr 2.76>>1.94. 11/15 WCx: Multi-org - no staph aureus / no group A strep; rare GNR - Enterobacter cloacae and klebsiella pneumonia  Plan: Continue Zosyn 3.375g IV q8h EI Monitor renal function and adjust dose as clinically indicated   Height: 5\' 7"  (170.2 cm) Weight: 104.3 kg (229 lb 15 oz) IBW/kg (Calculated) : 61.6  Temp (24hrs), Avg:97.7 F (36.5 C), Min:97.5 F (36.4 C), Max:98.3 F (36.8 C)  Recent Labs  Lab 09/28/20 1525 09/28/20 1530 09/28/20 2149 09/30/20 0557 10/01/20 0624 10/01/20 0841 10/02/20 0521 10/03/20 0518 10/04/20 0440 10/05/20 0348  WBC 13.7*   < > 14.4*   < >  --  17.5* 17.5* 18.3* 13.4* 13.1*  CREATININE  --    < > 0.92   < > 2.34* 2.49* 2.76* 2.54* 2.29* 1.94*  LATICACIDVEN 1.2  --  1.6  --   --   --   --   --   --   --   VANCOTROUGH  --   --   --   --  16  --   --   --   --   --    < > = values in this interval not displayed.    Antimicrobials this admission: 11/15 ceftriaxone x 1 11/15 cefepime >> 11/18 11/15 vancomycin >> 11/18 11/19 doxycycline x1 11/20 zosyn >>   Microbiology results: 11/15 WCx (shoulder): NG (pending) 11/15 WCx (leg): Multi-org - no staph aureus / no group A strep; rare GNR -  Enterobacter cloacae and klebsiella  pneumonia 11/15 BCx: NGTD 11/15 SARS CoV-2: negative 11/15 influenza A/B: negative  Thank you for allowing pharmacy to be a part of this patient's care.  Sherilyn Banker, PharmD Pharmacy Resident  10/05/2020 10:38 AM

## 2020-10-05 NOTE — Progress Notes (Signed)
PROGRESS NOTE    Angelica Juarez  XQJ:[id number] DOB: 1941/03/08 DOA: 09/28/2020 PCP: Angelica Hartigan, MD   Assessment & Plan:   Principal Problem:   Cellulitis of left leg Active Problems:   COPD with asthma (Angelica Juarez)   Essential hypertension   Seizure disorder (Angelica Juarez)   Type 2 diabetes mellitus with microalbuminuria, without long-term current use of insulin (HCC)   Rash and nonspecific skin eruption   Acute on chronic diastolic CHF (congestive heart failure) (HCC)   Left leg cellulitis: vs pemphigoid vs drug induced as per ID. Continue on IV zosyn as per ID.  - Wound c/s growing Enterobacter and Klebsiella. - I have discussed with Dr. Nehemiah Juarez to get skin biopsy of the lesions.  He might be able to do it later this evening if not tomorrow.  Leukocytosis: secondary to infection. Continue on IV Zosyn per ID  Thrombocytosis: etiology unclear. Will continue to monitor.   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  Nephrology following  Acute on chronic diastolic CHF: hold lasix secondary to AKI. Strict I/Os and daily weights. Echo on 06/12/2020 showed EF estimated at 50 to 55%, normal LV diastolic pa rameters, mild to moderate mitral regurgitation, mild to moderate TR.   Hypotension: Now resolved.  Continue on midodrine   COPD with asthma: w/o exacerbation. Continue on bronchodilators and encourage incentive spirometry.   Chronic hypoxic respiratory failure: continue on supplemental oxygen, currently 3L [place] which is pt's baseline   Seizure disorder: continue on keppra. Taper off of lamotrigine as per ID. I have cut back lamotrigine to 25 mg once daily from twice daily.  To be tapered off in the next 2 to 3 days.  Pharmacist aware  DVT prophylaxis: eliquis  Code Status: full  Family Communication: Disposition Plan: likely d/c to SNF[facility]  Status is: Inpatient  Remains inpatient appropriate because:Ongoing  diagnostic testing needed not appropriate for outpatient work up, Unsafe d/c plan and IV treatments appropriate due to intensity of illness or inability to take PO   Dispo: The patient is from: Home              Anticipated d/c is to:  SNF               Anticipated d/c date is: when it is ok w/ ID and work-up complete, waiting for skin biopsy of the back lesion              Patient currently is not medically stable to d/c.     Consultants:   ID  Nephrology  Dermatology   Procedures:    Antimicrobials: zosyn    Subjective: Very pleasant, no complaints.  Objective: Vitals:   10/05/20 0315 10/05/20 0400 10/05/20 0800 10/05/20 1152  BP: 99/68  (!) 149/56 (!) 137/54  Pulse: 85  78 88  Resp: 20  20 20   Temp: (!) 97.5 F (36.4 C)  97.6 F (36.4 C) (!) 97.5 F (36.4 C)  TempSrc: Oral  Oral Oral  SpO2: 92%  90% 93%  Weight:  104.3 kg    Height:        Intake/Output Summary (Last 24 hours) at 10/05/2020 1405 Last data filed at 10/05/2020 0200 Gross per 24 hour  Intake 100 ml  Output --  Net 100 ml   Filed Weights   10/03/20 0521 10/04/20 2305 10/05/20 0400  Weight: 107.4 kg 104.3 kg 104.3 kg    Examination:  General exam: Appears calm & comfortable  Respiratory system: diminished breath sounds b/l. No rales  Cardiovascular system: S1/S2+. No rubs or gallops  Gastrointestinal system: Abdomen is soft, obese, non-tender and hypoactive bowel sounds  Central nervous system: Alert and oriented. Moves all 4 extremities  Psychiatry: Judgement and insight appear normal. Flat mood and affect  Skin:  B/l LE are dressed and dressing is C/D/I      Data Reviewed: I have personally reviewed following labs and imaging studies  CBC: Recent Labs  Lab 09/28/20 1525 09/28/20 2149 09/30/20 0557 09/30/20 0557 10/01/20 0841 10/02/20 0521 10/03/20 0518 10/04/20 0440 10/05/20 0348  WBC 13.7*   < > 16.3*   < > 17.5* 17.5* 18.3* 13.4* 13.1*  NEUTROABS 11.6*  --  13.6*   --   --   --   --   --   --   HGB 11.3*   < > 11.1*   < > 10.7* 10.5* 10.8* 10.2* 10.5*  HCT 36.2   < > 36.6   < > 34.5* 33.6* 34.4* 32.4* 32.2*  MCV 90.5   < > 92.0   < > 91.0 89.8 88.0 88.0 87.7  PLT 545*   < > 531*   < > 543* 566* 654* 552* 563*   < > = values in this interval not displayed.   Basic Metabolic Panel: Recent Labs  Lab 09/30/20 0557 10/01/20 0624 10/01/20 0841 10/02/20 0521 10/03/20 0518 10/04/20 0440 10/05/20 0348  NA 139  --  137 136 135 137 140  K 3.6  --  3.6 3.7 3.7 3.5 3.4*  CL 99  --  99 99 100 104 104  CO2 28  --  25 23 25 24 26   GLUCOSE 146*  --  126* 122* 101* 104* 108*  BUN 25*  --  35* 42* 45* 44* 40*  CREATININE 1.67*   < > 2.49* 2.76* 2.54* 2.29* 1.94*  CALCIUM 9.4  --  9.0 9.0 8.7* 8.9 9.0  MG 1.8  --   --   --   --   --   --   PHOS  --   --   --   --   --   --  3.4   < > = values in this interval not displayed.   GFR: Estimated Creatinine Clearance: 30.2 mL/min (A) (by C-G formula based on SCr of 1.94 mg/dL (H)). Liver Function Tests: No results for input(s): AST, ALT, ALKPHOS, BILITOT, PROT, ALBUMIN in the last 168 hours. No results for input(s): LIPASE, AMYLASE in the last 168 hours. No results for input(s): AMMONIA in the last 168 hours. Coagulation Profile: No results for input(s): INR, PROTIME in the last 168 hours. Cardiac Enzymes: No results for input(s): CKTOTAL, CKMB, CKMBINDEX, TROPONINI in the last 168 hours. BNP (last 3 results) No results for input(s): PROBNP in the last 8760 hours. HbA1C: No results for input(s): HGBA1C in the last 72 hours. CBG: Recent Labs  Lab 10/04/20 1207 10/04/20 1717 10/04/20 2126 10/05/20 0754 10/05/20 1300  GLUCAP 118* 116* 107* 112* 135*   Lipid Profile: No results for input(s): CHOL, HDL, LDLCALC, TRIG, CHOLHDL, LDLDIRECT in the last 72 hours. Thyroid Function Tests: No results for input(s): TSH, T4TOTAL, FREET4, T3FREE, THYROIDAB in the last 72 hours. Anemia Panel: No results for  input(s): VITAMINB12, FOLATE, FERRITIN, TIBC, IRON, RETICCTPCT in the last 72 hours. Sepsis Labs: Recent Labs  Lab 09/28/20 1525 09/28/20 2149  LATICACIDVEN 1.2 1.6    Recent  Results (from the past 240 hour(s))  Culture, blood (Routine x 2)     Status: None   Collection Time: 09/28/20  3:29 PM   Specimen: BLOOD  Result Value Ref Range Status   Specimen Description BLOOD RIGHT ANTECUBITAL  Final   Special Requests   Final    BOTTLES DRAWN AEROBIC AND ANAEROBIC Blood Culture adequate volume   Culture   Final    NO GROWTH 5 DAYS Performed at [facility], [address] [place], [place] [address]    Report Status 10/03/2020 FINAL  Final  Culture, blood (Routine x 2)     Status: None   Collection Time: 09/28/20  5:47 PM   Specimen: BLOOD  Result Value Ref Range Status   Specimen Description BLOOD LEFT ANTECUBITAL  Final   Special Requests   Final    BOTTLES DRAWN AEROBIC AND ANAEROBIC Blood Culture results may not be optimal due to an excessive volume of blood received in culture bottles   Culture   Final    NO GROWTH 5 DAYS Performed at [facility], [address] [place], [place] [address]    Report Status 10/03/2020 FINAL  Final  Respiratory Panel by RT PCR (Flu A&B, Covid) - Nasopharyngeal Swab     Status: None   Collection Time: 09/28/20  6:37 PM   Specimen: Nasopharyngeal Swab  Result Value Ref Range Status   SARS Coronavirus 2 by RT PCR NEGATIVE NEGATIVE Final    Comment: (NOTE) SARS-CoV-2 target nucleic acids are NOT DETECTED.  The SARS-CoV-2 RNA is generally detectable in upper respiratoy specimens during the acute phase of infection. The lowest concentration of SARS-CoV-2 viral copies this assay can detect is 131 copies/mL. A negative result does not preclude SARS-Cov-2 infection and should not be used as the sole basis for treatment or other patient management decisions. A negative result may occur with  improper specimen  collection/handling, submission of specimen other than nasopharyngeal swab, presence of viral mutation(s) within the areas targeted by this assay, and inadequate number of viral copies (<131 copies/mL). A negative result must be combined with clinical observations, patient history, and epidemiological information. The expected result is Negative.  Fact Sheet for Patients:  PinkCheek.be  Fact Sheet for Healthcare Providers:  GravelBags.it  This test is no t yet approved or cleared by the [place] FDA and  has been authorized for detection and/or diagnosis of SARS-CoV-2 by FDA under an Emergency Use Authorization (EUA). This EUA will remain  in effect (meaning this test can be used) for the duration of the COVID-19 declaration under Section 564(b)(1) of the Act, 21 U.S.C. section 360bbb-3(b)(1), unless the authorization is terminated or revoked sooner.     Influenza A by PCR NEGATIVE NEGATIVE Final   Influenza B by PCR NEGATIVE NEGATIVE Final    Comment: (NOTE) The Xpert Xpress SARS-CoV-2/FLU/RSV assay is intended as an aid in  the diagnosis of influenza from Nasopharyngeal swab specimens and  should not be used as a sole basis for treatment. Nasal washings and  aspirates are unacceptable for Xpert Xpress SARS-CoV-2/FLU/RSV  testing.  Fact Sheet for Patients: PinkCheek.be  Fact Sheet for Healthcare Providers: GravelBags.it  This test is not yet approved or cleared by the [place] FDA and  has been authorized for detection and/or diagnosis of SARS-CoV-2 by  FDA under an Emergency Use Authorization (EUA). This EUA will remain  in effect (meaning this test can be used) for the duration of the  Covid-19 declaration  under Section 564(b)(1) of the Act, 21  U.S.C. section 360bbb-3(b)(1), unless the authorization is  terminated or revoked. Performed at [facility], [address] [place], [place] [address]   Aerobic Culture (superficial specimen)     Status: None   Collection Time: 09/28/20 11:22 PM   Specimen: SHOULDER  Result Value Ref Range Status   Specimen Description   Final    SHOULDER LEFT Performed at [facility], [place] [address] [place], [place] [address]    Special Requests   Final    NONE Performed at [facility], [place], [place] [address]    Gram Stain NO WBC SEEN NO ORGANISMS SEEN   Final   Culture   Final    NORMAL SKIN FLORA Performed at [facility], [place] [address] [place], [place] [address]    Report Status 10/03/2020 FINAL  Final  Aerobic Culture (superficial specimen)     Status: None   Collection Time: 09/28/20 11:22 PM   Specimen: Leg  Result Value Ref Range Status   Specimen Description   Final    LEG LEFT Performed at [facility], [address], [place], [place] [address]    Special Requests   Final    NONE Performed at [facility], [address] [place], [place] [address]    Gram Stain   Final    NO WBC SEEN FEW GRAM POSITIVE RODS Performed at [facility], [place] [address], [place], [place] [address]    Culture   Final    RARE ENTEROBACTER CLOACAE RARE KLEBSIELLA PNEUMONIAE    Report Status 10/05/2020 FINAL  Final   Organism ID, Bacteria ENTEROBACTER CLOACAE  Final   Organism ID, Bacteria KLEBSIELLA PNEUMONIAE  Final      Susceptibility   Enterobacter cloacae - MIC*    CEFAZOLIN >=64 RESISTANT Resistant     CEFEPIME <=0.12 SENSITIVE Sensitive     CEFTAZIDIME <=1 SENSITIVE Sensitive     CIPROFLOXACIN <=0.25 SENSITIVE Sensitive     GENTAMICIN <=1 SENSITIVE Sensitive     IMIPENEM <=0.25 SENSITIVE Sensitive     TRIMETH/SULFA <=20 SENSITIVE Sensitive     PIP/TAZO <=4 SENSITIVE Sensitive     * RARE ENTEROBACTER CLOACAE   Klebsiella pneumoniae - MIC*    AMPICILLIN >=32 RESISTANT Resistant     CEFAZOLIN <=4  SENSITIVE Sensitive     CEFEPIME <=0.12 SENSITIVE Sensitive     CEFTAZIDIME <=1 SENSITIVE Sensitive     CEFTRIAXONE <=0.25 SENSITIVE Sensitive     CIPROFLOXACIN <=0.25 SENSITIVE Sensitive     GENTAMICIN <=1 SENSITIVE Sensitive     IMIPENEM <=0.25 SENSITIVE Sensitive     TRIMETH/SULFA <=20 SENSITIVE Sensitive     AMPICILLIN/SULBACTAM 4 SENSITIVE Sensitive     PIP/TAZO <=4 SENSITIVE Sensitive     * RARE KLEBSIELLA PNEUMONIAE  Resp Panel by RT-PCR (Flu A&B, Covid) Nasopharyngeal Swab     Status: None   Collection Time: 10/01/20  6:35 PM   Specimen: Nasopharyngeal Swab; Nasopharyngeal(NP) swabs in vial transport medium  Result Value Ref Range Status   SARS Coronavirus 2 by RT PCR NEGATIVE NEGATIVE Final    Comment: (NOTE) SARS-CoV-2 target nucleic acids are NOT DETECTED.  The SARS-CoV-2 RNA is generally detectable in upper respiratory specimens during the acute phase of infection. The lowest concentration of SARS-CoV-2 viral copies this assay can detect is 138 copies/mL. A negative result does not preclude SARS-Cov-2 infection and should not be  used as the sole basis for treatment or other patient management decisions. A negative result may occur with  improper specimen collection/handling, submission of specimen other than nasopharyngeal swab, presence of viral mutation(s) within the areas targeted by this assay, and inadequate number of viral copies(<138 copies/mL). A negative result must be combined with clinical observations, patient history, and epidemiological information. The expected result is Negative.  Fact Sheet for Patients:  EntrepreneurPulse.com.au  Fact Sheet for Healthcare Providers:  IncredibleEmployment.be  This test is no t yet approved or cleared by the [place] FDA and  has been authorized for detection and/or diagnosis of SARS-CoV-2 by FDA under an Emergency Use Authorization (EUA). This EUA will remain  in effect  (meaning this test can be used) for the duration of the COVID-19 declaration under Section 564(b)(1) of the Act, 21 U.S.C.section 360bbb-3(b)(1), unless the authorization is terminated  or revoked sooner.       Influenza A by PCR NEGATIVE NEGATIVE Final   Influenza B by PCR NEGATIVE NEGATIVE Final    Comment: (NOTE) The Xpert Xpress SARS-CoV-2/FLU/RSV plus assay is intended as an aid in the diagnosis of influenza from Nasopharyngeal swab specimens and should not be used as a sole basis for treatment. Nasal washings and aspirates are unacceptable for Xpert Xpress SARS-CoV-2/FLU/RSV testing.  Fact Sheet for Patients: EntrepreneurPulse.com.au  Fact Sheet for Healthcare Providers: IncredibleEmployment.be  This test is not yet approved or cleared by the [place] FDA and has been authorized for detection and/or diagnosis of SARS-CoV-2 by FDA under an Emergency Use Authorization (EUA). This EUA will remain in effect (meaning this test can be used) for the duration of the COVID-19 declaration under Section 564(b)(1) of the Act, 21 U.S.C. section 360bbb-3(b)(1), unless the authorization is terminated or revoked.  Performed at  [facility], [address] [place], [place] [address]   Aerobic Culture (superficial specimen)     Status: Abnormal   Collection Time: 10/02/20  4:01 PM   Specimen: Wound  Result Value Ref Range Status   Specimen Description   Final    WOUND LEFT LEG Performed at [facility], [address], [place], [place] [address]    Special Requests   Final    NONE Performed at [facility], [address] [place], [place] [address]    Gram Stain   Final    NO WBC SEEN NO ORGANISMS SEEN Performed at [facility], [place] [address], [place],  [address]    Culture MULTIPLE ORGANISMS PRESENT, NONE PREDOMINANT (A)  Final   Report Status 10/05/2020 FINAL  Final         Radiology  Studies: US RENAL  Result Date: 10/04/2020 CLINICAL DATA:  ATN EXAM: RENAL / URINARY TRACT ULTRASOUND COMPLETE COMPARISON:  CT 01/16/2020, ultrasound 10/29/2012 FINDINGS: Right Kidney: Renal measurements: 11.2 x 6.1 x 6.3 cm = volume: 224 mL. Echogenicity within normal limits. No mass or hydronephrosis visualized. Left Kidney: Renal measurements: 10.7 x 7.1 x 6.3 cm = volume: 215 mL. Left kidney is poorly visible, particularly the lower pole. Cyst at the upper pole measuring 3.1 x 3.3 x 3.6 cm. No gross hydronephrosis. Bladder: Appears normal for degree of bladder distention. Other: Cirrhotic morphology of the liver which appears echogenic and nodular. There is a small amount of ascites adjacent to the liver. There is a small left-sided pleural effusion. IMPRESSION: 1. Normal ultrasound appearance of the right kidney 2. Limited visibility of left kidney secondary to bowel gas and positioning.  Simple appearing cyst upper pole left kidney. No hydronephrosis. 3. Cirrhotic morphology of the liver with small amount of ascites in the right upper quadrant. Left-sided pleural effusion. Electronically Signed   By: Donavan Foil M.D.   On: 10/04/2020 15:47        Scheduled Meds: . apixaban  5 mg Oral BID  . vitamin C  500 mg Oral BID  . aspirin EC  81 mg Oral Daily  . atorvastatin  40 mg Oral Daily  . B-complex with vitamin C   Oral Daily  . docusate sodium  200 mg Oral BID  . fluticasone  2 spray Each Nare Daily  . hydroxyurea  500 mg Oral Once per day on Sun Tue Wed Thu Fri Sat  . hydroxyurea  500 mg Oral 2 times per day every 7 days  . insulin aspart  0-15 Units Subcutaneous TID WC  . lamoTRIgine  25 mg Oral BID  . levETIRAcetam  500 mg Oral BID  . midodrine  10 mg Oral TID WC  . montelukast  10 mg Oral Daily  . NIFEdipine  30 mg Oral Daily  . polyethylene glycol  17 g Oral Daily  . sodium chloride flush  10-40 mL Intracatheter Q12H  . traZODone  50 mg Oral QHS  . umeclidinium-vilanterol  1  puff Inhalation Daily   Continuous Infusions: . sodium chloride Stopped (10/03/20 0518)  . piperacillin-tazobactam (ZOSYN)  IV 3.375 g (10/05/20 0500)     LOS: 6 days    Time spent: 34 mins     Max Sane, MD [facility] Pager [phone]  If 7PM-7AM, please contact night-coverage 10/05/2020, 2:05 PM

## 2020-10-05 NOTE — Progress Notes (Signed)
Mobility Specialist - Progress Note   10/05/20 1259  Mobility  Activity Transferred to/from [facility]Transferred:  Bed to chair  Level of Assistance Contact guard assist, steadying assist (min. assist S2S)  Assistive Device Front wheel walker  Distance Ambulated (ft) 10 ft  Mobility Response Tolerated well  Mobility performed by Mobility specialist  $Mobility charge 1 Mobility    Pt laying in bed, eating lunch upon arrival. Pt agreed to session. Pt able to get to EOB SBA. Pt S2S w/ bed elevated and min. Assist for boosting. Pt ambulated 10' total during this session. Pt transferred from bed to chair w/ CGA. Then, pt requested to use BSC. Pt transferred from chair to [facility] w/ CGA. Pt had an urinal output and loose BM. Mobility specialist provided assist w/ toilet hygiene. Pt transferred from [facility] to recliner w/ CGA. VC required during transfers for gait sequence. Pt had slow gait during transfers d/t being unsteady. Noted SOB during transfer. Pt states she feels "no more than normal" when SOB noted. O2 sat between 86-91% t/o session. Pt on 3L O2 . Overall, pt tolerated session well. Pt left sitting on recliner w/ alarm set and lunch placed in front of her. All needs placed in reach. Nurse was notified.      Twylia Oka Mobility Specialist  10/05/20, 1:07 PM

## 2020-10-05 NOTE — Progress Notes (Signed)
ID Pt doing better Sitting in recliner Pain legs better No fever Is still scratching her back  Patient Vitals for the past 24 hrs:  BP Temp Temp src Pulse Resp SpO2 Weight  10/05/20 2014 (!) 160/76 97.6 F (36.4 C) Oral 92 20 93 % --  10/05/20 1857 (!) 169/61 97.7 F (36.5 C) Oral 90 (!) 21 90 % --  10/05/20 1152 (!) 137/54 (!) 97.5 F (36.4 C) Oral 88 20 93 % --  10/05/20 0800 (!) 149/56 97.6 F (36.4 C) Oral 78 20 90 % --  10/05/20 0400 -- -- -- -- -- -- 104.3 kg  10/05/20 0315 99/68 (!) 97.5 F (36.4 C) Oral 85 20 92 % --  10/04/20 2305 (!) 139/50 97.6 F (36.4 C) Oral 89 20 91 % 104.3 kg    Awake and alert No distress Chest CTA HSs1s2 abd soft Upper back Plaque /eczematous ring like lesions With excoriation and bleeding   Leg dressings removed Left leg swelling nad erythema better Wounds healing Rt leg calf- superficial ulceration    10/05/20   10/05/20   10/02/20   10/02/20   Labs  CBC Latest Ref Rng & Units 10/05/2020 10/04/2020 10/03/2020  WBC 4.0 - 10.5 K/uL 13.1(H) 13.4(H) 18.3(H)  Hemoglobin 12.0 - 15.0 g/dL 10.5(L) 10.2(L) 10.8(L)  Hematocrit 36 - 46 % 32.2(L) 32.4(L) 34.4(L)  Platelets 150 - 400 K/uL 563(H) 552(H) 654(H)   CMP Latest Ref Rng & Units 10/05/2020 10/04/2020 10/03/2020  Glucose 70 - 99 mg/dL 108(H) 104(H) 101(H)  BUN 8 - 23 mg/dL 40(H) 44(H) 45(H)  Creatinine 0.44 - 1.00 mg/dL 1.94(H) 2.29(H) 2.54(H)  Sodium 135 - 145 mmol/L 140 137 135  Potassium 3.5 - 5.1 mmol/L 3.4(L) 3.5 3.7  Chloride 98 - 111 mmol/L 104 104 100  CO2 22 - 32 mmol/L 26 24 25   Calcium 8.9 - 10.3 mg/dL 9.0 8.9 8.7(L)  Total Protein 6.5 - 8.1 g/dL - - -  Total Bilirubin 0.3 - 1.2 mg/dL - - -  Alkaline Phos 38 - 126 U/L - - -  AST 15 - 41 U/L - - -  ALT 0 - 44 U/L - - -    Micro Wound culture- enterobacter and klebsiella  Impression/recommendation  B/l leg edema with ulceration and erythema left leg likely due to excess edema Now has secondary  infection D.D bullous lesions like pemhigus, pemphigoid or drug induced due to lamictal Enterobacter and klebsiella in culture- on zosyn and improving. Can be switched to bactrim adjusted ot crcl for 5 more days on discharge  Also lamictal has been tapered and will be discontinued tomorrow  Upper back plaque like lesions with erosions- r/o Pemphigus, pemphigoid or drug rash due to lamictal or lichen planus or mycoises fungoides?? Unlikely to be tinea Derm consult for the etiology of the lesion and biopsy of the upper back lesion +/-leg lesion  AKI on CKD- ( came with Normal cr on 11/15)unclear- vanco levels were normal- off vanco now Improving Seen by nephrologist. 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

## 2020-10-05 NOTE — Care Management Important Message (Signed)
Important Message  Patient Details  Name: Angelica Juarez MRN: [id number] Date of Birth: 1941/08/23   Medicare Important Message Given:  Yes     Dannette Barbara 10/05/2020, 12:20 PM

## 2020-10-05 NOTE — TOC Progression Note (Signed)
Transition of Care [facility]) - Progression Note    Patient Details  Name: Angelica Juarez MRN: [id number] Date of Birth: 06/26/1941  [facility] CM/SW Contact  Beverly Sessions, RN Phone Number: 10/05/2020, 1:37 PM  Clinical Narrative:     Anticipated discharge for tomorrow.  Ricky at [employer] Updated MD to order repeat covid test tomorrow    Expected Discharge Plan: Skilled Nursing Facility Barriers to Discharge: Continued Medical Work up  Expected Discharge Plan and Services Expected Discharge Plan: [place]   Discharge Planning Services: CM Consult   Living arrangements for the past 2 months: [place]: RN [facility] Agency: [place] ([place]) Date [place]: 09/30/20   Representative spoke with at [place]: [place] ([place]) Interventions    Readmission Risk Interventions Readmission Risk Prevention Plan 09/30/2020 06/03/2019  Transportation Screening Complete Complete  PCP or Specialist Appt within 5-7 Days - Complete  Home Care Screening - Complete  Medication Review (RN CM) - Complete  HRI or Home Care Consult Complete -  Palliative Care Screening Not Applicable -  Medication Review (RN Care Manager) Complete -  Some recent data might be hidden

## 2020-10-06 ENCOUNTER — Ambulatory Visit (HOSPITAL_COMMUNITY): Payer: Medicare Other | Admitting: Dermatology

## 2020-10-06 ENCOUNTER — Encounter: Payer: Self-pay | Admitting: Dermatology

## 2020-10-06 DIAGNOSIS — I10 Essential (primary) hypertension: Secondary | ICD-10-CM | POA: Diagnosis not present

## 2020-10-06 DIAGNOSIS — L03116 Cellulitis of left lower limb: Secondary | ICD-10-CM

## 2020-10-06 DIAGNOSIS — J449 Chronic obstructive pulmonary disease, unspecified: Secondary | ICD-10-CM | POA: Diagnosis not present

## 2020-10-06 DIAGNOSIS — I5033 Acute on chronic diastolic (congestive) heart failure: Secondary | ICD-10-CM | POA: Diagnosis not present

## 2020-10-06 DIAGNOSIS — R21 Rash and other nonspecific skin eruption: Secondary | ICD-10-CM

## 2020-10-06 DIAGNOSIS — E119 Type 2 diabetes mellitus without complications: Secondary | ICD-10-CM | POA: Diagnosis not present

## 2020-10-06 DIAGNOSIS — I4891 Unspecified atrial fibrillation: Secondary | ICD-10-CM

## 2020-10-06 LAB — BASIC METABOLIC PANEL
Anion gap: 9 (ref 5–15)
BUN: 37 mg/dL — ABNORMAL HIGH (ref 8–23)
CO2: 27 mmol/L (ref 22–32)
Calcium: 9.4 mg/dL (ref 8.9–10.3)
Chloride: 105 mmol/L (ref 98–111)
Creatinine, Ser: 1.87 mg/dL — ABNORMAL HIGH (ref 0.44–1.00)
GFR, Estimated: 27 mL/min — ABNORMAL LOW (ref >60)
Glucose, Bld: 113 mg/dL — ABNORMAL HIGH (ref 70–99)
Potassium: 3.8 mmol/L (ref 3.5–5.1)
Sodium: 141 mmol/L (ref 135–145)

## 2020-10-06 LAB — FANA STAINING PATTERNS
Centriole Pattern: 24 — ABNORMAL HIGH
Homogeneous Pattern: 1 — ABNORMAL HIGH

## 2020-10-06 LAB — GLUCOSE, CAPILLARY
Glucose-Capillary: 98 mg/dL (ref 70–99)
Glucose-Capillary: 119 mg/dL — ABNORMAL HIGH (ref 70–99)

## 2020-10-06 LAB — CBC
HCT: 31.2 % — ABNORMAL LOW (ref 36.0–46.0)
Hemoglobin: 9.7 g/dL — ABNORMAL LOW (ref 12.0–15.0)
MCH: 28 pg (ref 26.0–34.0)
MCHC: 31.1 g/dL (ref 30.0–36.0)
MCV: 90.2 fL (ref 80.0–100.0)
Platelets: 509 10*3/uL — ABNORMAL HIGH (ref 150–400)
RBC: 3.46 MIL/uL — ABNORMAL LOW (ref 3.87–5.11)
RDW: 15.9 % — ABNORMAL HIGH (ref 11.5–15.5)
WBC: 9.8 10*3/uL (ref 4.0–10.5)
nRBC: 0.2 % (ref 0.0–0.2)

## 2020-10-06 LAB — IMMUNOFIXATION ELECTROPHORESIS
IgA: 125 mg/dL (ref 64–422)
IgG (Immunoglobin G), Serum: 599 mg/dL (ref 586–1602)
IgM (Immunoglobulin M), Srm: 54 mg/dL (ref 26–217)
Total Protein ELP: 5.3 g/dL — ABNORMAL LOW (ref 6.0–8.5)

## 2020-10-06 LAB — ANTINUCLEAR ANTIBODIES, IFA: ANA Ab, IFA: POSITIVE — AB

## 2020-10-06 LAB — SARS CORONAVIRUS 2 BY RT PCR (HOSPITAL ORDER, PERFORMED IN ~~LOC~~ HOSPITAL LAB): SARS Coronavirus 2: NEGATIVE

## 2020-10-06 LAB — COMPLEMENT, TOTAL: Compl, Total (CH50): >60 U/mL (ref >41)

## 2020-10-06 MED ORDER — SULFAMETHOXAZOLE-TRIMETHOPRIM 800-160 MG PO TABS: 1.0000 | ORAL_TABLET | Freq: Two times a day (BID) | ORAL | 0 refills | Status: AC

## 2020-10-06 MED ORDER — PHENYLEPHRINE HCL 0.5 % NA SOLN
2.0000 [drp] | Freq: Once | NASAL | Status: AC
  Administered 2020-10-06: 2 [drp] via NASAL
  Filled 2020-10-06: qty 15

## 2020-10-06 NOTE — Progress Notes (Signed)
[facility]  ROUNDING NOTE   Subjective:  Patient is a 79 year old Caucasian female with a past medical history of diabetes mellitus type 2, hypertension, atrial fibrillation-on Eliquis, microscopic colitis who presented to the ED with chief complaint of left leg swelling.Nephrology was consulted for AKI.  Patient's renal function is improving gradually. Patient reports consuming breakfast, no nausea or vomiting, still has SOB, at baseline.   Objective:  Vital signs in last 24 hours:  Temp:  [97.6 F (36.4 C)-98.6 F (37 C)] 98 F (36.7 C) (11/23 1144) Pulse Rate:  [84-99] 91 (11/23 1144) Resp:  [18-21] 20 (11/23 1144) BP: (131-169)/(50-90) 156/59 (11/23 1144) SpO2:  [89 %-100 %] 93 % (11/23 1144) FiO2 (%):  [35 %-40 %] 35 % (11/23 1136) Weight:  [104.6 kg] 104.6 kg (11/23 0500)  Weight change: 0.3 kg Filed Weights   10/04/20 2305 10/05/20 0400 10/06/20 0500  Weight: 104.3 kg 104.3 kg 104.6 kg    Intake/Output: I/O last 3 completed shifts: In: 250 [IV Piggyback:250] Out: -    Intake/Output this shift:  Total I/O In: 240 [P.O.:240] Out: -   Physical Exam: General: Sitting up at the side of the bed,in no acute distress  Head: Normocephalic, atraumatic. Moist oral mucosal membranes  Eyes: Anicteric  Lungs:  Clear to auscultation bilaterally,dypnea on exertion +  Heart: Regular rate and rhythm  Abdomen:  Soft, nontender, non distended  Extremities:  Lt Leg 2+ erythematous, Rt leg 1+  Neurologic: Oriented,speech clear and appropriate  Skin: Lt leg erythematous, warm and tender    Basic Metabolic Panel: Recent Labs  Lab 09/30/20 0557 10/01/20 0624 10/02/20 0521 10/02/20 0521 10/03/20 0518 10/03/20 0518 10/04/20 0440 10/05/20 0348 10/06/20 0506  NA 139   < > 136  --  135  --  137 140 141  K 3.6   < > 3.7  --  3.7  --  3.5 3.4* 3.8  CL 99   < > 99  --  100  --  104 104 105  CO2 28   < > 23  --  25  --  24 26 27   GLUCOSE 146*   < > 122*  --  101*   --  104* 108* 113*  BUN 25*   < > 42*  --  45*  --  44* 40* 37*  CREATININE 1.67*   < > 2.76*  --  2.54*  --  2.29* 1.94* 1.87*  CALCIUM 9.4   < > 9.0   < > 8.7*   < > 8.9 9.0 9.4  MG 1.8  --   --   --   --   --   --   --   --   PHOS  --   --   --   --   --   --   --  3.4  --    < > = values in this interval not displayed.    Liver Function Tests: Recent Labs  Lab 10/05/20 2126  AST 23  ALT 13  ALKPHOS 83  BILITOT 0.4  PROT 5.8*  ALBUMIN 2.9*   No results for input(s): LIPASE, AMYLASE in the last 168 hours. No results for input(s): AMMONIA in the last 168 hours.  CBC: Recent Labs  Lab 09/30/20 0557 10/01/20 0841 10/02/20 0521 10/03/20 0518 10/04/20 0440 10/05/20 0348 10/06/20 0506  WBC 16.3*   < > 17.5* 18.3* 13.4* 13.1* 9.8  NEUTROABS 13.6*  --   --   --   --   --   --  HGB 11.1*   < > 10.5* 10.8* 10.2* 10.5* 9.7*  HCT 36.6   < > 33.6* 34.4* 32.4* 32.2* 31.2*  MCV 92.0   < > 89.8 88.0 88.0 87.7 90.2  PLT 531*   < > 566* 654* 552* 563* 509*   < > = values in this interval not displayed.    Cardiac Enzymes: No results for input(s): CKTOTAL, CKMB, CKMBINDEX, TROPONINI in the last 168 hours.  BNP: Invalid input(s): POCBNP  CBG: Recent Labs  Lab 10/05/20 1300 10/05/20 1640 10/05/20 2118 10/06/20 0815 10/06/20 1140  GLUCAP 135* 89 119* 98 119*    Microbiology: Results for orders placed or performed during the hospital encounter of 09/28/20  Culture, blood (Routine x 2)     Status: None   Collection Time: 09/28/20  3:29 PM   Specimen: BLOOD  Result Value Ref Range Status   Specimen Description BLOOD RIGHT ANTECUBITAL  Final   Special Requests   Final    BOTTLES DRAWN AEROBIC AND ANAEROBIC Blood Culture adequate volume   Culture   Final    NO GROWTH 5 DAYS Performed at [facility], [address] [place], [place] [address]    Report Status 10/03/2020 FINAL  Final  Culture, blood (Routine x 2)     Status: None   Collection Time: 09/28/20   5:47 PM   Specimen: BLOOD  Result Value Ref Range Status   Specimen Description BLOOD LEFT ANTECUBITAL  Final   Special Requests   Final    BOTTLES DRAWN AEROBIC AND ANAEROBIC Blood Culture results may not be optimal due to an excessive volume of blood received in culture bottles   Culture   Final    NO GROWTH 5 DAYS Performed at [facility], [address] [place], [place] [address]    Report Status 10/03/2020 FINAL  Final  Respiratory Panel by RT PCR (Flu A&B, Covid) - Nasopharyngeal Swab     Status: None   Collection Time: 09/28/20  6:37 PM   Specimen: Nasopharyngeal Swab  Result Value Ref Range Status   SARS Coronavirus 2 by RT PCR NEGATIVE NEGATIVE Final    Comment: (NOTE) SARS-CoV-2 target nucleic acids are NOT DETECTED.  The SARS-CoV-2 RNA is generally detectable in upper respiratoy specimens during the acute phase of infection. The lowest concentration of SARS-CoV-2 viral copies this assay can detect is 131 copies/mL. A negative result does not preclude SARS-Cov-2 infection and should not be used as the sole basis for treatment or other patient management decisions. A negative result may occur with  improper specimen collection/handling, submission of specimen other than nasopharyngeal swab, presence of viral mutation(s) within the areas targeted by this assay, and inadequate number of viral copies (<131 copies/mL). A negative result must be combined with clinical observations, patient history, and epidemiological information. The expected result is Negative.  Fact Sheet for Patients:  PinkCheek.be  Fact Sheet for Healthcare Providers:  GravelBags.it  This test is no t yet approved or cleared by the [place] FDA and  has been authorized for detection and/or diagnosis of SARS-CoV-2 by FDA under an Emergency Use Authorization (EUA). This EUA will remain  in effect (meaning this test can be used)  for the duration of the COVID-19 declaration under Section 564(b)(1) of the Act, 21 U.S.C. section 360bbb-3(b)(1), unless the authorization is terminated or revoked sooner.     Influenza A by PCR NEGATIVE NEGATIVE Final   Influenza B by PCR NEGATIVE NEGATIVE Final  Comment: (NOTE) The Xpert Xpress SARS-CoV-2/FLU/RSV assay is intended as an aid in  the diagnosis of influenza from Nasopharyngeal swab specimens and  should not be used as a sole basis for treatment. Nasal washings and  aspirates are unacceptable for Xpert Xpress SARS-CoV-2/FLU/RSV  testing.  Fact Sheet for Patients: PinkCheek.be  Fact Sheet for Healthcare Providers: GravelBags.it  This test is not yet approved or cleared by the [place] FDA and  has been authorized for detection and/or diagnosis of SARS-CoV-2 by  FDA under an Emergency Use Authorization (EUA). This EUA will remain  in effect (meaning this test can be used) for the duration of the  Covid-19 declaration under Section 564(b)(1) of the Act, 21  U.S.C. section 360bbb-3(b)(1), unless the authorization is  terminated or revoked. Performed at [facility], [address] [place], [place] [address]   Aerobic Culture (superficial specimen)     Status: None   Collection Time: 09/28/20 11:22 PM   Specimen: SHOULDER  Result Value Ref Range Status   Specimen Description   Final    SHOULDER LEFT Performed at [facility], [place] [address] [place], [place] [address]    Special Requests   Final    NONE Performed at [facility], [place], [place] [address]    Gram Stain NO WBC SEEN NO ORGANISMS SEEN   Final   Culture   Final    NORMAL SKIN FLORA Performed at [facility], [place] [address], [place], [place] [address]    Report Status 10/03/2020 FINAL  Final  Aerobic Culture (superficial specimen)     Status: None   Collection Time: 09/28/20  11:22 PM   Specimen: Leg  Result Value Ref Range Status   Specimen Description   Final    LEG LEFT Performed at [facility], [address], [place], [place] [address]    Special Requests   Final    NONE Performed at [facility], [address] [place], [place] [address]    Gram Stain   Final    NO WBC SEEN FEW GRAM POSITIVE RODS Performed at [facility], [place] [address], [place], [place] [address]    Culture   Final    RARE ENTEROBACTER CLOACAE RARE KLEBSIELLA PNEUMONIAE    Report Status 10/05/2020 FINAL  Final   Organism ID, Bacteria ENTEROBACTER CLOACAE  Final   Organism ID, Bacteria KLEBSIELLA PNEUMONIAE  Final      Susceptibility   Enterobacter cloacae - MIC*    CEFAZOLIN >=64 RESISTANT Resistant     CEFEPIME <=0.12 SENSITIVE Sensitive     CEFTAZIDIME <=1 SENSITIVE Sensitive     CIPROFLOXACIN <=0.25 SENSITIVE Sensitive     GENTAMICIN <=1 SENSITIVE Sensitive     IMIPENEM <=0.25 SENSITIVE Sensitive     TRIMETH/SULFA <=20 SENSITIVE Sensitive     PIP/TAZO <=4 SENSITIVE Sensitive     * RARE ENTEROBACTER CLOACAE   Klebsiella pneumoniae - MIC*    AMPICILLIN >=32 RESISTANT Resistant     CEFAZOLIN <=4 SENSITIVE Sensitive     CEFEPIME <=0.12 SENSITIVE Sensitive     CEFTAZIDIME <=1 SENSITIVE Sensitive     CEFTRIAXONE <=0.25 SENSITIVE Sensitive     CIPROFLOXACIN <=0.25 SENSITIVE Sensitive     GENTAMICIN <=1 SENSITIVE Sensitive     IMIPENEM <=0.25 SENSITIVE Sensitive     TRIMETH/SULFA <=20 SENSITIVE Sensitive     AMPICILLIN/SULBACTAM 4 SENSITIVE Sensitive     PIP/TAZO <=4 SENSITIVE Sensitive     *  RARE KLEBSIELLA PNEUMONIAE  Resp Panel by RT-PCR (Flu A&B, Covid) Nasopharyngeal Swab     Status: None   Collection Time: 10/01/20  6:35 PM   Specimen: Nasopharyngeal Swab; Nasopharyngeal(NP) swabs in vial transport medium  Result Value Ref Range Status   SARS Coronavirus 2 by RT PCR NEGATIVE NEGATIVE Final    Comment: (NOTE) SARS-CoV-2 target  nucleic acids are NOT DETECTED.  The SARS-CoV-2 RNA is generally detectable in upper respiratory specimens during the acute phase of infection. The lowest concentration of SARS-CoV-2 viral copies this assay can detect is 138 copies/mL. A negative result does not preclude SARS-Cov-2 infection and should not be used as the sole basis for treatment or other patient management decisions. A negative result may occur with  improper specimen collection/handling, submission of specimen other than nasopharyngeal swab, presence of viral mutation(s) within the areas targeted by this assay, and inadequate number of viral copies(<138 copies/mL). A negative result must be combined with clinical observations, patient history, and epidemiological information. The expected result is Negative.  Fact Sheet for Patients:  EntrepreneurPulse.com.au  Fact Sheet for Healthcare Providers:  IncredibleEmployment.be  This test is no t yet approved or cleared by the [place] FDA and  has been authorized for detection and/or diagnosis of SARS-CoV-2 by FDA under an Emergency Use Authorization (EUA). This EUA will remain  in effect (meaning this test can be used) for the duration of the COVID-19 declaration under Section 564(b)(1) of the Act, 21 U.S.C.section 360bbb-3(b)(1), unless the authorization is terminated  or revoked sooner.       Influenza A by PCR NEGATIVE NEGATIVE Final   Influenza B by PCR NEGATIVE NEGATIVE Final    Comment: (NOTE) The Xpert Xpress SARS-CoV-2/FLU/RSV plus assay is intended as an aid in the diagnosis of influenza from Nasopharyngeal swab specimens and should not be used as a sole basis for treatment. Nasal washings and aspirates are unacceptable for Xpert Xpress SARS-CoV-2/FLU/RSV testing.  Fact Sheet for Patients: EntrepreneurPulse.com.au  Fact Sheet for Healthcare  Providers: IncredibleEmployment.be  This test is not yet approved or cleared by the [place] FDA and has been authorized for detection and/or diagnosis of SARS-CoV-2 by FDA under an Emergency Use Authorization (EUA). This EUA will remain in effect (meaning this test can be used) for the duration of the COVID-19 declaration under Section 564(b)(1) of the Act, 21 U.S.C. section 360bbb-3(b)(1), unless the authorization is terminated or revoked.  Performed at [facility], [address], [place], [place] [address]   Aerobic Culture (superficial specimen)     Status: Abnormal   Collection Time: 10/02/20  4:01 PM   Specimen: Wound  Result Value Ref Range Status   Specimen Description   Final    WOUND LEFT LEG Performed at [facility], [address] [place], [place] [address]    Special Requests   Final    NONE Performed at [facility], [address] [place], [place] [address]    Gram Stain   Final    NO WBC SEEN NO ORGANISMS SEEN Performed at [facility], [place] [address] [place], [place] [address]    Culture MULTIPLE ORGANISMS PRESENT, NONE PREDOMINANT (A)  Final   Report Status 10/05/2020 FINAL  Final  SARS Coronavirus 2 by RT PCR (hospital order, performed in [facility] hospital lab) Nasopharyngeal Nasopharyngeal Swab     Status: None   Collection Time: 10/06/20 11:45 AM   Specimen: Nasopharyngeal Swab  Result Value Ref Range Status  SARS Coronavirus 2 NEGATIVE NEGATIVE Final    Comment: (NOTE) SARS-CoV-2 target nucleic acids are NOT DETECTED.  The SARS-CoV-2 RNA is generally detectable in upper and lower respiratory specimens during the acute phase of infection. The lowest concentration of SARS-CoV-2 viral copies this assay can detect is 250 copies / mL. A negative result does not preclude SARS-CoV-2 infection and should not be used as the sole basis for treatment or other patient management decisions.  A  negative result may occur with improper specimen collection / handling, submission of specimen other than nasopharyngeal swab, presence of viral mutation(s) within the areas targeted by this assay, and inadequate number of viral copies (<250 copies / mL). A negative result must be combined with clinical observations, patient history, and epidemiological information.  Fact Sheet for Patients:   StrictlyIdeas.no  Fact Sheet for Healthcare Providers: BankingDealers.co.za  This test is not yet approved or  cleared by the [place] FDA and has been authorized for detection and/or diagnosis of SARS-CoV-2 by FDA under an Emergency Use Authorization (EUA).  This EUA will remain in effect (meaning this test can be used) for the duration of the COVID-19 declaration under Section 564(b)(1) of the Act, 21 U.S.C. section 360bbb-3(b)(1), unless the authorization is terminated or revoked sooner.  Performed at [facility], [address] [place], [place] [address]     Coagulation Studies: No results for input(s): LABPROT, INR in the last 72 hours.  Urinalysis: No results for input(s): COLORURINE, LABSPEC, PHURINE, GLUCOSEU, HGBUR, BILIRUBINUR, KETONESUR, PROTEINUR, UROBILINOGEN, NITRITE, LEUKOCYTESUR in the last 72 hours.  Invalid input(s): APPERANCEUR    Imaging: No results found.   Medications:    sodium chloride Stopped (10/03/20 0518)   piperacillin-tazobactam (ZOSYN)  IV 3.375 g (10/06/20 0617)    apixaban  5 mg Oral BID   vitamin C  500 mg Oral BID   aspirin EC  81 mg Oral Daily   atorvastatin  40 mg Oral Daily   B-complex with vitamin C   Oral Daily   docusate sodium  200 mg Oral BID   fluticasone  2 spray Each Nare Daily   hydroxyurea  500 mg Oral Once per day on Sun Tue Wed Thu Fri Sat   hydroxyurea  500 mg Oral 2 times per day every 7 days   insulin aspart  0-15 Units Subcutaneous TID WC    levETIRAcetam  500 mg Oral BID   midodrine  10 mg Oral TID WC   montelukast  10 mg Oral Daily   NIFEdipine  30 mg Oral Daily   polyethylene glycol  17 g Oral Daily   sodium chloride flush  10-40 mL Intracatheter Q12H   traZODone  50 mg Oral QHS   umeclidinium-vilanterol  1 puff Inhalation Daily   sodium chloride, acetaminophen, albuterol, bisacodyl, morphine injection, sodium chloride, sodium chloride flush  Assessment/ Plan:  [name] is a 79 y.o.  female a past medical history of polycythemia, COPD, microscopic colitis, CHF, diabetes mellitus who was admitted on November 15 with chief complaint of left lower extremity swelling/cellulitis  # Acute Kidney Injury on CKD  On CKD Stage iiiA  #Proteinuria ATN/AIN/Possible [place] Toxicity Lab Results  Component Value Date   CREATININE 1.87 (H) 10/06/2020   CREATININE 1.94 (H) 10/05/2020   CREATININE 2.29 (H) 10/04/2020  Renal function improving gradually Will continue monitoring No acute indication for dialysis   #Anemia of CKD  Lab Results  Component Value Date   HGB 9.7 (L)  10/06/2020   #Hypokalemia K+normalized to 3.8 with oral potassium supplements   #Left Leg Cellulitis  Enterobacter and klebsiella in culture Patient is on Zocyn, [place] team following   LOS: 7 Ailsa Mireles 11/23/20212:11 PM

## 2020-10-06 NOTE — Progress Notes (Signed)
OT Cancellation Note  Patient Details Name: Angelica Juarez MRN: [id number] DOB: [birth year]   Cancelled Treatment:    Reason Eval/Treat Not Completed: Other (comment)  Pt eating lunch and on the telephone when OT presents for treatment. Pt is also to d/c this date. Will f/u for OT tx time-permitting.  Gerrianne Scale, [place], OTR/L ascom [phone] 10/06/20, 1:10 PM

## 2020-10-06 NOTE — TOC Transition Note (Signed)
Transition of Care [facility]) - CM/SW Discharge Note   Patient Details  Name: ZO LOUDON MRN: [id number] Date of Birth: 1941/10/20  Transition of Care [facility]) CM/SW Contact:  Beverly Sessions, RN Phone Number: 10/06/2020, 12:56 PM   Clinical Narrative:    Patient to discharge today to [place] at [place] notified.  DC info sent in the [place]  EMS transport arranged for 3pm  Repeat covid test pending.       Final next level of care: [place] Barriers to Discharge: No Barriers Identified   Patient Goals and CMS Choice        Discharge Placement              Patient chooses bed at: [place] Patient to be transferred to facility by: EMS      Discharge Plan and Services   Discharge Planning Services: CM Consult                      HH Arranged: RN [facility] Agency: [place] ([place]) Date [place]: 09/30/20   Representative spoke with at [place]: [place] ([place]) Interventions     Readmission Risk Interventions Readmission Risk Prevention Plan 09/30/2020 06/03/2019  Transportation Screening Complete Complete  PCP or Specialist Appt within 5-7 Days - Complete  Home Care Screening - Complete  Medication Review (RN CM) - Complete  HRI or Home Care Consult Complete -  Palliative Care Screening Not Applicable -  Medication Review (RN Care Manager) Complete -  Some recent data might be hidden

## 2020-10-06 NOTE — Progress Notes (Signed)
Physical Therapy Treatment Patient Details Name: Angelica Juarez MRN: [id number] DOB: [birth year] Today's Date: 10/06/2020    History of Present Illness      PT Comments    Therapist in to see pt x 3 today.  Unable to participate due to pt c/o fatigue, LE discomfort, and upon third return, pt had just received several biopsies and declined activity.   Follow Up Recommendations        Equipment Recommendations       Recommendations for Other Services       Precautions / Restrictions Restrictions Weight Bearing Restrictions: No    Mobility  Bed Mobility                  Transfers                    Ambulation/Gait                 Stairs             Wheelchair Mobility    Modified Rankin (Stroke Patients Only)       Balance                                            Cognition                                              Exercises      General Comments        Pertinent Vitals/Pain      Home Living                      Prior Function            PT Goals (current goals can now be found in the care plan section)      Frequency           PT Plan      Co-evaluation              AM-PAC PT "6 Clicks" Mobility   Outcome Measure                   End of Session               Time:  -     Charges:                       Mikel Cella, PTA   [name] 10/06/2020, 12:17 PM

## 2020-10-06 NOTE — Discharge Instructions (Signed)

## 2020-10-06 NOTE — Progress Notes (Signed)
Angelica Juarez to be D/C'd to [facility] per MD order.  Discussed prescriptions and follow up appointments with the patient. Prescriptions given to patient, medication list explained in detail. [facility] called and report given to nurse.   Allergies as of 10/06/2020       Reactions   Iodinated Diagnostic Agents Anaphylaxis   Other reaction(s): Other (See Comments) Throat swells and extreme hives   Latex Itching   Phenobarbital Hives   Tape Rash   silicones        Medication List     STOP taking these medications    budesonide 3 MG 24 hr capsule Commonly known as: ENTOCORT EC   lamoTRIgine 25 MG tablet Commonly known as: LAMICTAL   mesalamine 1.2 g EC tablet Commonly known as: Lialda   VITAMIN B COMPLEX PO   vitamin C 500 MG tablet Commonly known as: ASCORBIC ACID       TAKE these medications    Anoro Ellipta 62.5-25 MCG/INH Aepb Generic drug: umeclidinium-vilanterol Inhale 1 puff into the lungs daily.   apixaban 5 MG Tabs tablet Commonly known as: ELIQUIS Take 1 tablet (5 mg total) by mouth 2 (two) times daily.   aspirin 81 MG tablet Take 81 mg by mouth daily.   atorvastatin 40 MG tablet Commonly known as: LIPITOR Take 40 mg by mouth daily.   diphenhydrAMINE 25 mg capsule Commonly known as: BENADRYL Take 25 mg by mouth daily.   fluticasone 50 MCG/ACT nasal spray Commonly known as: FLONASE Place 2 sprays into both nostrils daily.   furosemide 40 MG tablet Commonly known as: Lasix Take 1 tablet (40 mg total) by mouth daily. What changed: Another medication with the same name was removed. Continue taking this medication, and follow the directions you see here.   hydroxyurea 500 MG capsule Commonly known as: HYDREA Take 1 pill twice a day on Mondays only and 1 pill a day by mouth on Tuesdays-Sundays. May take with food to minimize GI side effects.   levETIRAcetam 500 MG tablet Commonly known as: KEPPRA Take 500 mg by mouth 2 (two) times  daily.   metFORMIN 500 MG tablet Commonly known as: GLUCOPHAGE Take 500 mg by mouth 2 (two) times daily with a meal.   montelukast 10 MG tablet Commonly known as: SINGULAIR TAKE 1 TABLET BY MOUTH EVERY DAY   Multi-Vitamins Tabs Take 1 tablet by mouth daily.   Nifedical XL 30 MG 24 hr tablet Generic drug: NIFEdipine Take 30 mg by mouth daily.   OXYGEN Inhale 2 L into the lungs.   potassium chloride SA 20 MEQ tablet Commonly known as: KLOR-CON Take 20 mEq by mouth daily.   sitaGLIPtin 25 MG tablet Commonly known as: JANUVIA Take 1 tablet by mouth daily.   sulfamethoxazole-trimethoprim 800-160 MG tablet Commonly known as: BACTRIM DS Take 1 tablet by mouth 2 (two) times daily for 5 days.   traZODone 100 MG tablet Commonly known as: DESYREL Take by mouth.   valsartan-hydrochlorothiazide 320-25 MG tablet Commonly known as: DIOVAN-HCT Take 1 tablet by mouth daily.   Ventolin HFA 108 (90 Base) MCG/ACT inhaler Generic drug: albuterol INHALE 2 PUFFS INTO THE LUNGS EVERY 6 HOURS AS NEEDED What changed: See the new instructions.   albuterol 1.25 MG/3ML nebulizer solution Commonly known as: ACCUNEB Take 3 mLs (1.25 mg total) by nebulization every 6 (six) hours as needed. wheezing What changed: Another medication with the same name was changed. Make sure you understand how and when to take each.  Discharge Care Instructions  (From admission, onward)           Start     Ordered   10/06/20 0000  Discharge wound care:       Comments: As above   10/06/20 1152            Vitals:   10/06/20 1144 10/06/20 1450  BP: (!) 156/59 (!) 180/78  Pulse: 91 87  Resp: 20 20  Temp: 98 F (36.7 C) 98 F (36.7 C)  SpO2: 93% 96%    Skin clean, dry and intact except for bilateral lower legs and back. On back bandaids placed over open areas. Dressings changes done on legs. Dressing instructions given to receiving nurse.   An After Visit Summary was  printed and given to the patient. Patient picked up by [facility] and taken to [facility].   Angelica Juarez

## 2020-10-06 NOTE — Progress Notes (Signed)
Patient had nosebleed for several hours. After intervening with ocean spray and compressions to the nose, NP Randol Kern ordered Afrin. It was given to patient twice as one times doses after the nosebleed continued after first Afrin spray. The second spray helped stopped the nosebleed. NP Randol Kern also ordered nasal cannula to be changed and a facial mask be put on by RT. The patient is currently sitting down on recliner has face tent on at 10 ml per liter and O2 Sats are remaining at 94%.

## 2020-10-06 NOTE — Discharge Summary (Signed)
[facility] [place] NAME: Angelica Juarez    MR#:  [id number]  DATE OF BIRTH:  Apr 17, 1941  DATE OF ADMISSION:  09/28/2020   ADMITTING PHYSICIAN: Jennye Boroughs, MD  DATE OF DISCHARGE: 10/06/2020  PRIMARY CARE PHYSICIAN: Sofie Hartigan, MD   ADMISSION DIAGNOSIS:  Paroxysmal atrial fibrillation (HCC) [I48.0] Left leg cellulitis [P37.902] Cellulitis of left lower leg [L03.116] Type 2 diabetes mellitus without complication, without long-term current use of insulin (HCC) [E11.9] Chronic obstructive pulmonary disease, unspecified COPD type ([place]) [J44.9] Chronic congestive heart failure, unspecified heart failure type ([place]) [I50.9] Cellulitis of left leg [L03.116] DISCHARGE DIAGNOSIS:  Principal Problem:   Cellulitis of left leg Active Problems:   COPD with asthma ([place])   Essential hypertension   Seizure disorder (HCC)   Type 2 diabetes mellitus with microalbuminuria, without long-term current use of insulin (HCC)   Rash and nonspecific skin eruption   Acute on chronic diastolic CHF (congestive heart failure) ([place])  SECONDARY DIAGNOSIS:   Past Medical History:  Diagnosis Date  . Allergy    Seasonal  . Arthritis   . Cancer [facility])    fallopian tubes- radiation  . Chronic kidney disease    chronic renal insufficiency  . Colon polyps 05/21/14  . COPD (chronic obstructive pulmonary disease) ([place])   . Diabetes mellitus without complication ([place])   . Dyspnea   . Fracture closed, humerus, shaft    right   . History of kidney stones    40 years ago  . Hyperlipidemia 04/30/14  . Hypertension   . Impingement syndrome of right shoulder 12/29/15  . Personal history of radiation therapy   . Pneumonia    hx  . Rotator cuff tear 04/21/16   right  . Seizures ([place])   . Sleep apnea    HOSPITAL COURSE:  79 y.o. female with a history of Polycythemia rubra, COPD, microscopic colitis, CHF, b/l TKA, was admitted on 11/15 with left leg swelling and blisters  B/l  leg edema with ulceration, erythema and secondary infection of left leg likely due to excess edema  Enterobacter and klebsiella in wound culture- switched to bactrim for 5 more days at discharge per ID  Upper back plaque like lesions with erosions- r/o Pemphigus, pemphigoid or drug rash due to lamictal or lichen planus or mycoises fungoides?? 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 Stopped Lamotrigine (tapered off)  Microscopic colitis - No diarrhea now and off mesalamine and budesonide  Acute on chronic diastolic CHF: hold lasix secondary to AKI. Strict I/Os and daily weights. Echo on 06/12/2020 showed EF estimated at 50 to 55%, normal LV diastolic pa rameters, mild to moderate mitral regurgitation, mild to moderate TR.   Hypotension: Now resolved.  Continue on midodrine   COPD with asthma: w/o exacerbation. Continue on bronchodilators and encourage incentive spirometry.   Chronic hypoxic respiratory failure: continue on supplemental oxygen, currently 2-3L [place] which is pt's baseline    DISCHARGE CONDITIONS:  stable CONSULTS OBTAINED:   DRUG ALLERGIES:   Allergies  Allergen Reactions  .  Iodinated Diagnostic Agents Anaphylaxis    Other reaction(s): Other (See Comments) Throat swells and extreme hives  . Latex Itching  . Phenobarbital Hives  . Tape Rash    silicones   DISCHARGE MEDICATIONS:   Allergies as of 10/06/2020      Reactions   Iodinated Diagnostic Agents Anaphylaxis   Other reaction(s): Other (See Comments) Throat swells and extreme hives   Latex  Itching   Phenobarbital Hives   Tape Rash   silicones      Medication List    STOP taking these medications   budesonide 3 MG 24 hr capsule Commonly known as: ENTOCORT EC   lamoTRIgine 25 MG tablet Commonly known as: LAMICTAL   mesalamine 1.2 g EC tablet Commonly known as: Lialda   VITAMIN B COMPLEX PO   vitamin C 500 MG tablet Commonly known as: ASCORBIC ACID     TAKE these medications   Anoro Ellipta 62.5-25 MCG/INH Aepb Generic drug: umeclidinium-vilanterol Inhale 1 puff into the lungs daily.   apixaban 5 MG Tabs tablet Commonly known as: ELIQUIS Take 1 tablet (5 mg total) by mouth 2 (two) times daily.   aspirin 81 MG tablet Take 81 mg by mouth daily.   atorvastatin 40 MG tablet Commonly known as: LIPITOR Take 40 mg by mouth daily.   diphenhydrAMINE 25 mg capsule Commonly known as: BENADRYL Take 25 mg by mouth daily.   fluticasone 50 MCG/ACT nasal spray Commonly known as: FLONASE Place 2 sprays into both nostrils daily.   furosemide 40 MG tablet Commonly known as: Lasix Take 1 tablet (40 mg total) by mouth daily. What changed: Another medication with the same name was removed. Continue taking this medication, and follow the directions you see here.   hydroxyurea 500 MG capsule Commonly known as: HYDREA Take 1 pill twice a day on Mondays only and 1 pill a day by mouth on Tuesdays-Sundays. May take with food to minimize GI side effects.   levETIRAcetam 500 MG tablet Commonly known as: KEPPRA Take 500 mg by mouth 2 (two) times daily.   metFORMIN 500 MG tablet Commonly known as: GLUCOPHAGE Take 500 mg by mouth 2 (two) times daily with a meal.   montelukast 10 MG tablet Commonly known as: SINGULAIR TAKE 1 TABLET BY MOUTH EVERY DAY   Multi-Vitamins Tabs Take 1 tablet by mouth daily.   Nifedical XL 30 MG 24 hr tablet Generic drug: NIFEdipine Take 30 mg by mouth daily.   OXYGEN Inhale 2 L into the lungs.   potassium chloride SA 20 MEQ  tablet Commonly known as: KLOR-CON Take 20 mEq by mouth daily.   sitaGLIPtin 25 MG tablet Commonly known as: JANUVIA Take 1 tablet by mouth daily.   sulfamethoxazole-trimethoprim 800-160 MG tablet Commonly known as: BACTRIM DS Take 1 tablet by mouth 2 (two) times daily for 5 days.   traZODone 100 MG tablet Commonly known as: DESYREL Take by mouth.   valsartan-hydrochlorothiazide 320-25 MG tablet Commonly known as: DIOVAN-HCT Take 1 tablet by mouth daily.   Ventolin HFA 108 (90 Base) MCG/ACT inhaler Generic drug: albuterol INHALE 2 PUFFS INTO THE LUNGS EVERY 6 HOURS AS NEEDED What changed: See the new instructions.   albuterol 1.25 MG/3ML nebulizer solution Commonly known as: ACCUNEB Take 3 mLs (1.25 mg total) by nebulization every 6 (six) hours as needed. wheezing What changed: Another medication with the same name was changed. Make sure you understand how and when to take each.  Discharge Care Instructions  (From admission, onward)         Start     Ordered   10/06/20 0000  Discharge wound care:       Comments: As above   10/06/20 1152         DISCHARGE INSTRUCTIONS:   DIET:  Renal diet DISCHARGE CONDITION:  Stable ACTIVITY:  Activity as tolerated OXYGEN:  Home Oxygen: Yes.    Oxygen Delivery: 3 liters/min via Patient connected to nasal cannula oxygen DISCHARGE LOCATION:  nursing home   If you experience worsening of your admission symptoms, develop shortness of breath, life threatening emergency, suicidal or homicidal thoughts you must seek medical attention immediately by calling 911 or calling your MD immediately  if symptoms less severe.  You Must read complete instructions/literature along with all the possible adverse reactions/side effects for all the Medicines you take and that have been prescribed to you. Take any new Medicines after you have completely understood and accpet all the possible adverse reactions/side effects.   Please  note  You were cared for by a hospitalist during your hospital stay. If you have any questions about your discharge medications or the care you received while you were in the hospital after you are discharged, you can call the unit and asked to speak with the hospitalist on call if the hospitalist that took care of you is not available. Once you are discharged, your primary care physician will handle any further medical issues. Please note that NO REFILLS for any discharge medications will be authorized once you are discharged, as it is imperative that you return to your primary care physician (or establish a relationship with a primary care physician if you do not have one) for your aftercare needs so that they can reassess your need for medications and monitor your lab values.    On the day of Discharge:  VITAL SIGNS:  Blood pressure (!) 156/59, pulse 91, temperature 98 F (36.7 C), temperature source Oral, resp. rate 20, height 5\' 7"  (1.702 m), weight 104.6 kg, SpO2 93 %. PHYSICAL EXAMINATION:  GENERAL:  79 y.o.-year-old patient lying in the bed with no acute distress.  EYES: Pupils equal, round, reactive to light and accommodation. No scleral icterus. Extraocular muscles intact.  HEENT: Head atraumatic, normocephalic. Oropharynx and nasopharynx clear.  NECK:  Supple, no jugular venous distention. No thyroid enlargement, no tenderness.  LUNGS: Normal breath sounds bilaterally, no wheezing, rales,rhonchi or crepitation. No use of accessory muscles of respiration.  CARDIOVASCULAR: S1, S2 normal. No murmurs, rubs, or gallops.  ABDOMEN: Soft, non-tender, non-distended. Bowel sounds present. No organomegaly or mass.  EXTREMITIES: No pedal edema, cyanosis, or clubbing.  NEUROLOGIC: Cranial nerves II through XII are intact. Muscle strength 5/5 in all extremities. Sensation intact. Gait not checked.  PSYCHIATRIC: The patient is alert and oriented x 3.  SKIN: No obvious rash, lesion, or ulcer.  DATA  REVIEW:   CBC Recent Labs  Lab 10/06/20 0506  WBC 9.8  HGB 9.7*  HCT 31.2*  PLT 509*    Chemistries  Recent Labs  Lab 09/30/20 0557 10/01/20 0624 10/05/20 0348 10/05/20 2126 10/06/20 0506  NA 139   < >   < >  --  141  K 3.6   < >   < >  --  3.8  CL 99   < >   < >  --  105  CO2 28   < >   < >  --  27  GLUCOSE 146*   < >   < >  --  113*  BUN 25*   < >   < >  --  37*  CREATININE 1.67*   < >   < >  --  1.87*  CALCIUM 9.4   < >   < >  --  9.4  MG 1.8  --   --   --   --   AST  --   --   --  23  --   ALT  --   --   --  13  --   ALKPHOS  --   --   --  83  --   BILITOT  --   --   --  0.4  --    < > = values in this interval not displayed.     Outpatient follow-up  Contact information for follow-up providers    Sofie Hartigan, MD. Schedule an appointment as soon as possible for a visit in 1 week(s).   Specialty: Family Medicine Contact information: [place] [address] [phone]        Ralene Bathe, MD. Schedule an appointment as soon as possible for a visit in 2 week(s).   Specialty: Dermatology Contact information: [address]   [place] [address] [phone]        Tsosie Billing, MD. Schedule an appointment as soon as possible for a visit in 2 week(s).   Specialty: Infectious Diseases Contact information: [place] [address] [phone]        Vladimir Crofts, MD. Schedule an appointment as soon as possible for a visit in 1 month(s).   Specialty: Neurology Contact information: [facility] [facility] [place] [address] [phone]            Contact information for after-discharge care    Destination    [facility] .   Service: Skilled Nursing Contact information: [address] [phone]                   Management plans  discussed with the patient, nursing and they are in agreement.  CODE STATUS: Full Code   TOTAL TIME TAKING CARE OF THIS PATIENT: 45 minutes.    Max Sane M.D on 10/06/2020 at 12:04 PM  [facility]   CC: Primary care physician; Sofie Hartigan, MD   Note: This dictation was prepared with Dragon dictation along with smaller phrase technology. Any transcriptional errors that result from this process are unintentional.

## 2020-10-06 NOTE — Progress Notes (Signed)
.  jack

## 2020-10-06 NOTE — Progress Notes (Signed)
Initial [place] / [place] 10/06/20  Subjective  Angelica Juarez is a 79 y.o. female inpatient at [facility].  Dr Sarina Ser - Dermatologist is asked for consult to evaluate Rash.  Consult submitted by Dr Manuella Ghazi.   79 year old white female with history of diabetes mellitus; hypertension; atrial fibrillation (currently on Eliquis); and colitis treated with budesonide. The patient was seen in the emergency department around a week ago for history of swelling in the leg for 1 week. She was diagnosis with cellulitis of the left leg with ulcers and skin sloughing. The patient also gave history of a rash on the trunk since this summer associated with blisters on the back that the patient picked at. The patient was admitted approximately a week ago to [facility] and treated with IV antibiotics for her cellulitis left leg which has improved. I Ralene Bathe, MD-dermatologist at [facility]) am asked to evaluate the patient for her rash of the back.  I reviewed the chart reviewed photos and interviewed the patient and did a comprehensive history and comprehensive physical exam on the patient today.      The following portions of the chart were reviewed this encounter and updated as appropriate:  Tobacco   Allergies   Meds   Problems   Med Hx   Surg Hx   Fam Hx       Review of Systems:  No other skin or systemic complaints except as noted in HPI or Assessment and Plan.  Objective  Well appearing patient in no apparent distress; mood and affect are within normal limits.  A comprehensive examination was performed including the head; face, trunk and extremities. Relevant physical exam findings are noted in the Assessment and Plan.  Objective  back (2): Red rash with (history of blisters) crusts, ulcerations on the back and legs. The patient's legs and arms which are much improved. She still has some erythema but the ulcers were improved and there  was no longer any significant erythema or edema of the extremities. The back showed pink and red patches that are mostly on the upper back with some ulcers and crusts.    Assessment & Plan  Rash -  1) Cellulitis of Left Leg - resolving after IV Antibiotics 2) Rash with history of blisters of back - DDx: Bullous Pemphigoid vs Drug Reaction vs Porphyria 2ndary to medication Left leg; Back  Cellulitis of the left leg-appears to be resolving after IV antibiotic treatment. Rash on the upper back-this could represent bullous pemphigoid; drug reaction; porphyria secondary to medication (hepatitis C was negative)  Recommend punch biopsy on the upper back of lesional skin (where the rash is present) for H and E. Also recommend punch biopsy and perilesional skin for direct immunofluorescence evaluation. Consent acquired from patient area anesthetized and punch biopsy form x2 and suture placement.  Please remove sutures after 1 week. If patient is discharged she may return to my office in 1 week (please make patient an appointment [place] [facility] [phone]) for suture removal and discussion of pathology results.  I discussed this face-to-face with Dr Max Sane this morning.   Skin / nail biopsy - back Type of biopsy: punch   Informed consent: discussed and consent obtained   Timeout: patient name, date of birth, surgical site, and procedure verified   Procedure prep:  Patient was prepped and draped in usual sterile fashion (the patient was cleaned and prepped) Prep type:  Isopropyl alcohol Anesthesia:  the lesion was anesthetized in a standard fashion   Anesthetic:  1% lidocaine w/ epinephrine 1-100,000 buffered w/ 8.4% NaHCO3 Punch size:  3 mm Suture size:  4-0 Suture type: nylon   Hemostasis achieved with: suture, pressure and aluminum chloride   Outcome: patient tolerated procedure well   Post-procedure details: sterile dressing applied and wound care instructions given   Dressing type:  bandage, petrolatum and pressure dressing   Additional details:  Biopsy taken on the back *H&E rash/lesional skin*  Skin / nail biopsy - back Type of biopsy: punch   Informed consent: discussed and consent obtained   Timeout: patient name, date of birth, surgical site, and procedure verified   Procedure prep:  Patient was prepped and draped in usual sterile fashion (the patient was cleaned and prepped) Prep type:  Isopropyl alcohol Anesthesia: the lesion was anesthetized in a standard fashion   Anesthetic:  1% lidocaine w/ epinephrine 1-100,000 buffered w/ 8.4% NaHCO3 Punch size:  3 mm Suture size:  4-0 Suture type: nylon   Hemostasis achieved with: suture, pressure and aluminum chloride   Outcome: patient tolerated procedure well   Post-procedure details: sterile dressing applied and wound care instructions given   Dressing type: bandage, petrolatum and pressure dressing   Additional details:  Biopsy taken of the back *DIF [employer] [employer] - perilesional skin    Specimen 1 - Surgical pathology Differential Diagnosis: D48.5 r/o bullous pemphigoid vs other blistering disease vs drug reaction  Check Margins: No Red rash with history of blisters, crusts, ulcerations on the back and legs  *H&E rash/lesional skin [facility] RESULTS*  Specimen 2 - Surgical pathology Differential Diagnosis: D48.5  Check Margins: No Red rash with history of blisters, crusts, ulcerations on the back and legs  *DIF [facility] perilesional skin* [facility] RESULTS   Return in about 1 week (around 10/13/2020) for suture removal and to discuss pathology results.  Angelica Juarez, CMA, am acting as scribe for Sarina Ser, MD .   Documentation: I have reviewed the above documentation for accuracy and completeness, and I agree with the above.  Sarina Ser, MD

## 2020-10-13 ENCOUNTER — Other Ambulatory Visit: Payer: Self-pay | Admitting: Hematology and Oncology

## 2020-10-13 ENCOUNTER — Telehealth: Payer: Self-pay

## 2020-10-13 DIAGNOSIS — D45 Polycythemia vera: Secondary | ICD-10-CM

## 2020-10-13 NOTE — Progress Notes (Signed)
[facility]  [address], [address] [place], [place] [address] Phone: [phone]  Fax: [phone]   Clinic Day:  10/14/2020  Referring physician: Sofie Hartigan, MD  Chief Complaint: Angelica Juarez is a 79 y.o. 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   HPI: The patient was last seen in the hematology clinic on 07/09/2020. At that time, she has felt "ok". She notes shortness of breath on exertion. She developed some new right foot soreness without trauma. Exam is unremarkable. Hematocrit was 34.7, hemoglobin 11.5, MCV 103.9, platelets 289,000, WBC 11,000 (ANC 8,900). She continued hydroxyurea 500 mg daily.  The patient was admitted to [facility] from 09/28/2020 - 10/06/2020 with left leg swelling and blisters. Wound culture showed enterobacter and klebsiella. Bilateral lower extremity duplex was negative. She was treated with IV antibiotics and was switched to Bactrim for 5 days after discharge.  Labs followed: 09/28/2020: Hematocrit 36.2, hemoglobin 11.3, MCV 90.5, platelets 545,000, WBC 133,700 (ANC 11,600). 10/06/2020: Hematocrit 31.2, hemoglobin   9.7, MCV 90.2, platelets 509,000, WBC     9,800.  During the interim, she has been "tired." The patient thinks that she slid out of bed and hurt her ribs on the right side but cannot remember for sure. She is bruised from her shoulder to her waist.  Her mouth is dry. She has been on 2 liters/min of oxygen for 4-5 months. She reports shortness of breath on exertion if she is not wearing oxygen. She has a rash on her upper back and neck which was biopsied when she was hospitalized.  She state that she bruises easily on her arms. She denies chest pain, palpitations, nausea, vomiting, diarrhea, vaginal bleeding, and any other bleeding.  The patient is currently staying at [facility] where they are taking care of her left foot wound. She still has pain in her foot. She also has  a wound on her right foot but it is mostly healed. She is not sleeping well in the facility.  The patient notes that her hydroxyurea dose is stable (2 hydroxyurea pills on Monday and 1 the rest of the days of the week). She takes potassium 20 mEq daily. Dr. Ubaldo Glassing put her on Eliquis.   Past Medical History:  Diagnosis Date  . Allergy    Seasonal  . Arthritis   . 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 Chronic kidney disease    chronic renal insufficiency  . Colon polyps 05/21/14  . COPD (chronic obstructive pulmonary disease) ([place])   . Diabetes mellitus without complication ([place])   . Dyspnea   . Fracture closed, humerus, shaft    right   . History of kidney stones    40 years ago  . Hyperlipidemia 04/30/14  . Hypertension   . Impingement syndrome of right shoulder 12/29/15  . Personal history of radiation therapy   . Pneumonia    hx  . Rotator cuff tear 04/21/16   right  . Seizures ([place])   . Sleep apnea     Past Surgical History:  Procedure Laterality Date  . ABDOMINAL HYSTERECTOMY    . COLONOSCOPY WITH PROPOFOL N/A 06/01/2020   Procedure: COLONOSCOPY WITH PROPOFOL;  Surgeon: Lin Landsman, MD;  Location: [facility] ENDOSCOPY;  Service: Gastroenterology;  Laterality: N/A;  . ESOPHAGOGASTRODUODENOSCOPY (EGD) WITH PROPOFOL N/A 06/01/2020   Procedure: ESOPHAGOGASTRODUODENOSCOPY (EGD) WITH PROPOFOL;  Surgeon: Lin Landsman, MD;  Location: [facility] ENDOSCOPY;  Service: Gastroenterology;  Laterality: N/A;  . 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 EYE SURGERY     bilateral cataracts  . JOINT REPLACEMENT     bilateral knee replacement  . NASAL SEPTUM SURGERY    . OOPHORECTOMY    . REPLACEMENT TOTAL KNEE Bilateral 2004  . SHOULDER ARTHROSCOPY WITH BICEPSTENOTOMY Right 05/25/2016   Procedure: SHOULDER ARTHROSCOPY WITH BICEPSTENOTOMY;  Surgeon: Leanor Kail, MD;  Location: [facility] ORS;  Service: Orthopedics;  Laterality: Right;  . SHOULDER ARTHROSCOPY WITH DISTAL  CLAVICLE RESECTION Right 05/25/2016   Procedure: SHOULDER ARTHROSCOPY WITH DISTAL CLAVICLE RESECTION;  Surgeon: Leanor Kail, MD;  Location: [facility] ORS;  Service: Orthopedics;  Laterality: Right;  . SHOULDER ARTHROSCOPY WITH OPEN ROTATOR CUFF REPAIR Right 05/25/2016   Procedure: SHOULDER ARTHROSCOPY WITH OPEN ROTATOR CUFF REPAIR;  Surgeon: Leanor Kail, MD;  Location: [facility] ORS;  Service: Orthopedics;  Laterality: Right;  . SUBACROMIAL DECOMPRESSION Right 05/25/2016   Procedure: SUBACROMIAL DECOMPRESSION;  Surgeon: Leanor Kail, MD;  Location: [facility] ORS;  Service: Orthopedics;  Laterality: Right;    Family History  Problem Relation Age of Onset  . Breast cancer Paternal Aunt 74  . Diabetes Father   . Heart disease Father   . Breast cancer Daughter 68    Social History:  reports that she quit smoking about 15 years ago. She has a 40.00 pack-year smoking history. She has never used smokeless tobacco. She reports that she does not drink alcohol and does not use drugs. Patient moved to [place] in 2001 from [place]. Patient is a former 1 ppd smoker x 40 years; quit in 2006. She is retired from [employer]. Patient denies known exposures to radiation on toxins. Her significant other is [employer]. She lives in [place]. She lost her daughter to a heart attack last month. The patient is alone today.   Allergies:  Allergies  Allergen Reactions  . Iodinated Diagnostic Agents Anaphylaxis    Other reaction(s): Other (See Comments) Throat swells and extreme hives  . Latex Itching  . Phenobarbital Hives  . Tape Rash    silicones    Current Medications: Current Outpatient Medications  Medication Sig Dispense Refill  . albuterol (ACCUNEB) 1.25 MG/3ML nebulizer solution Take 3 mLs (1.25 mg total) by nebulization every 6 (six) hours as needed. wheezing 75 mL 3  . apixaban (ELIQUIS) 5 MG TABS tablet Take 1 tablet (5 mg total) by mouth 2 (two) times daily. 60 tablet 0  . aspirin 81 MG tablet Take 81 mg  by mouth daily.    Marland Juarez atorvastatin (LIPITOR) 40 MG tablet Take 40 mg by mouth daily.    . diphenhydrAMINE (BENADRYL) 25 mg capsule Take 25 mg by mouth daily.    . fluticasone (FLONASE) 50 MCG/ACT nasal spray Place 2 sprays into both nostrils daily. 16 g 2  . furosemide (LASIX) 40 MG tablet Take 1 tablet (40 mg total) by mouth daily. 7 tablet 0  . hydroxyurea (HYDREA) 500 MG capsule Take 1 pill twice a day on Mondays only and 1 pill a day by mouth on Tuesdays-Sundays. May take with food to minimize GI side effects. 100 capsule 1  . levETIRAcetam (KEPPRA) 500 MG tablet Take 500 mg by mouth 2 (two) times daily.    . metFORMIN (GLUCOPHAGE) 500 MG tablet Take 500 mg by mouth 2 (two) times daily with a meal.    . montelukast (SINGULAIR) 10 MG tablet TAKE 1 TABLET BY MOUTH EVERY DAY (Patient taking differently: Take 10 mg  by mouth daily. ) 90 tablet 0  . Multiple Vitamin (MULTI-VITAMINS) TABS Take 1 tablet by mouth daily.    Marland Juarez NIFEdipine (NIFEDICAL XL) 30 MG 24 hr tablet Take 30 mg by mouth daily.    . OXYGEN Inhale 2 L into the lungs.    . potassium chloride SA (KLOR-CON) 20 MEQ tablet Take 20 mEq by mouth daily.    . sitaGLIPtin (JANUVIA) 25 MG tablet Take 1 tablet by mouth daily.    . traZODone (DESYREL) 100 MG tablet Take by mouth.    . umeclidinium-vilanterol (ANORO ELLIPTA) 62.5-25 MCG/INH AEPB Inhale 1 puff into the lungs daily. 180 each 3  . valsartan-hydrochlorothiazide (DIOVAN-HCT) 320-25 MG tablet Take 1 tablet by mouth daily.  0  . VENTOLIN HFA 108 (90 Base) MCG/ACT inhaler INHALE 2 PUFFS INTO THE LUNGS EVERY 6 HOURS AS NEEDED (Patient taking differently: Inhale 2 puffs into the lungs every 6 (six) hours as needed for wheezing or shortness of breath. ) 54 g 1   No current facility-administered medications for this visit.    Review of Systems  Constitutional: Positive for weight loss (4 lbs). Negative for chills, diaphoresis, fever and malaise/fatigue.       Feels "tired."  HENT:  Negative.  Negative for congestion, ear discharge, ear pain, hearing loss, nosebleeds, sinus pain, sore throat and tinnitus.        Mouth is dry.  Eyes: Negative.  Negative for blurred vision.       Macular degeneration left eye, receives injections.  Respiratory: Positive for shortness of breath (on exertion). Negative for cough, hemoptysis and sputum production.        Sleep apnea - uses CPAP. COPD on 2 liters/min of oxygen.  Cardiovascular: Negative for chest pain, palpitations and leg swelling.  Gastrointestinal: Negative for abdominal pain, blood in stool, constipation, diarrhea, heartburn, melena, nausea and vomiting.  Genitourinary: Negative.  Negative for dysuria, frequency, hematuria and urgency.  Musculoskeletal: Negative for back pain (DDD), falls, joint pain (OA in shoulders), myalgias and neck pain.       B/L foot pain.  Skin: Positive for rash (upper back and neck). Negative for itching.       Large bruise on right side  Neurological: Negative for dizziness, tremors, sensory change, speech change, weakness and headaches.  Endo/Heme/Allergies: Bruises/bleeds easily (bruising on arms).  Psychiatric/Behavioral: Negative for depression and memory loss. The patient has insomnia. The patient is not nervous/anxious.   All other systems reviewed and are negative.  Performance status (ECOG): 3  Vitals Blood pressure 134/63, pulse (!) 106, temperature 97.9 F (36.6 C), temperature source Tympanic, resp. rate 19, SpO2 100 %.  Physical Exam Vitals and nursing note reviewed.  Constitutional:      General: She is not in acute distress.    Appearance: She is well-developed. She is not diaphoretic.     Comments: Fatigued appearing woman sitting comfortably in a wheelchair in no acute distress.  HENT:     Head: Normocephalic and atraumatic.     Comments: Short gray hair.    Mouth/Throat:     Mouth: Mucous membranes are moist.     Pharynx: Oropharynx is clear.  Eyes:     General: No  scleral icterus.    Extraocular Movements: Extraocular movements intact.     Conjunctiva/sclera: Conjunctivae normal.     Pupils: Pupils are equal, round, and reactive to light.     Comments: Glasses.  Brown eyes.  Cardiovascular:     Rate and Rhythm:  Normal rate and regular rhythm.     Heart sounds: Murmur (flow) heard.   Pulmonary:     Effort: Pulmonary effort is normal. No respiratory distress.  Chest:     Chest wall: No tenderness.  Abdominal:     General: Bowel sounds are normal. There is no distension.     Palpations: Abdomen is soft. There is no hepatomegaly, splenomegaly or mass.     Tenderness: There is no abdominal tenderness. There is no guarding or rebound.  Musculoskeletal:        General: Tenderness (left foot) present. No swelling. Normal range of motion.     Cervical back: Normal range of motion and neck supple.     Right lower leg: Edema present.     Left lower leg: Edema (L>R, extends above knees) present.  Lymphadenopathy:     Head:     Right side of head: No preauricular, posterior auricular or occipital adenopathy.     Left side of head: No preauricular, posterior auricular or occipital adenopathy.     Cervical: No cervical adenopathy.     Upper Body:     Right upper body: No supraclavicular or axillary adenopathy.     Left upper body: No supraclavicular or axillary adenopathy.     Lower Body: No right inguinal adenopathy. No left inguinal adenopathy.  Skin:    General: Skin is warm and dry.     Comments: Extensive seep ecchymosis on right side (see photos).  Neurological:     Mental Status: She is alert and oriented to person, place, and time.  Psychiatric:        Behavior: Behavior normal.        Thought Content: Thought content normal.        Judgment: Judgment normal.     10/14/2020:  Right side (anterior, lateral, posterior)    Appointment on 10/14/2020  Component Date Value Ref Range Status  . Sodium 10/14/2020 135  135 - 145 mmol/L Final  .  Potassium 10/14/2020 5.3* 3.5 - 5.1 mmol/L Final  . Chloride 10/14/2020 99  98 - 111 mmol/L Final  . CO2 10/14/2020 27  22 - 32 mmol/L Final  . Glucose, Bld 10/14/2020 115* 70 - 99 mg/dL Final   Glucose reference range applies only to samples taken after fasting for at least 8 hours.  . BUN 10/14/2020 33* 8 - 23 mg/dL Final  . Creatinine, Ser 10/14/2020 1.88* 0.44 - 1.00 mg/dL Final  . Calcium 10/14/2020 9.1  8.9 - 10.3 mg/dL Final  . Total Protein 10/14/2020 5.8* 6.5 - 8.1 g/dL Final  . Albumin 10/14/2020 3.0* 3.5 - 5.0 g/dL Final  . AST 10/14/2020 24  15 - 41 U/L Final  . ALT 10/14/2020 17  0 - 44 U/L Final  . Alkaline Phosphatase 10/14/2020 63  38 - 126 U/L Final  . Total Bilirubin 10/14/2020 0.9  0.3 - 1.2 mg/dL Final  . GFR, Estimated 10/14/2020 27* >60 mL/min Final   Comment: (NOTE) Calculated using the CKD-EPI Creatinine Equation (2021)   . Anion gap 10/14/2020 9  5 - 15 Final   Performed at [facility], [address] [place], [place] [address]  . WBC 10/14/2020 13.8* 4.0 - 10.5 K/uL Final  . RBC 10/14/2020 2.39* 3.87 - 5.11 MIL/uL Final  . Hemoglobin 10/14/2020 6.9* 12.0 - 15.0 g/dL Final  . HCT 10/14/2020 22.3* 36 - 46 % Final  . MCV 10/14/2020 93.3  80.0 - 100.0 fL Final  .  MCH 10/14/2020 28.9  26.0 - 34.0 pg Final  . MCHC 10/14/2020 30.9  30.0 - 36.0 g/dL Final  . RDW 10/14/2020 19.9* 11.5 - 15.5 % Final  . Platelets 10/14/2020 367  150 - 400 K/uL Final  . nRBC 10/14/2020 0.0  0.0 - 0.2 % Final  . Neutrophils Relative % 10/14/2020 85  % Final  . Neutro Abs 10/14/2020 11.8* 1.7 - 7.7 K/uL Final  . Lymphocytes Relative 10/14/2020 3  % Final  . Lymphs Abs 10/14/2020 0.4* 0.7 - 4.0 K/uL Final  . Monocytes Relative 10/14/2020 8  % Final  . Monocytes Absolute 10/14/2020 1.1* 0.1 - 1.0 K/uL Final  . Eosinophils Relative 10/14/2020 2  % Final  . Eosinophils Absolute 10/14/2020 0.3  0.0 - 0.5 K/uL Final  . Basophils Relative 10/14/2020 1  % Final  . Basophils  Absolute 10/14/2020 0.1  0.0 - 0.1 K/uL Final  . Immature Granulocytes 10/14/2020 1  % Final  . Abs Immature Granulocytes 10/14/2020 0.18* 0.00 - 0.07 K/uL Final   Performed at [facility], [address], [place], [place] [address]    Assessment:  Angelica Juarez is a 79 y.o. female withpolycythemia rubra vera.She has had sleep apnea x 7 years and uses CPAP. She has a 40 pack year smoking history (stopped 13 years ago). She denies and cardiac history.  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 There was no significant dysplasia or increased blasts. Overall features were c/w polycythemia rubra vera.  Work up on 08/08/2018 revealed a WBC of 16,700 ([place] 13,800). Hemoglobin 15.3, hematocrit 45.5, MCV 87.9, and platelets 552,000. Erythropoietin level was normal at 2.9 mIU/mL. BCR/ABL demonstrated no BCR or ABL gene rearrangements. Carbon monoxide level was normal at 3.0% (0-3.6 %). 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   She began a phlebotomy programon 09/07/2018 (last04/30/2020). Hematocrit goalis <=42.  She began hydroxyurea500 mg a day on 09/12/2018.  She is taking hydroxyurea 1000 mg on Mondays and 500 mg 6 days/week.  She was diagnosed with C difficile + diarrheaon 10/10/2018; completed 10 day course of oral vancomycin. 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  Colonoscopy on 06/01/2020 revealed erythematous mucosa in the descending, transverse, and ascending colon and diverticulosis in the sigmoid colon. There were non-bleeding external hemorrhoids. Four polyps were removed (tubular adenomas). Pathology revealed diffuse microscopic colitis.  She was admitted to [facility] from 06/11/2020 - 06/13/2020 for COPD exacerbation and acute on chronic heart failure. Echocardiogram revealed an EF of  50-55%. She received Lasix.  She was admitted to [facility] from 09/28/2020 - 10/06/2020 with left leg swelling and blisters. Wound culture revealed enterobacter and klebsiella. Bilateral lower extremity duplex was negative. She was treated with IV antibiotics discharged on Bactrim.  She received her first COVID-19 vaccine on 02/28/2020 and last COVID-19 vaccine 2 weeks ago  Symptomatically, she is fatigued.  She took a fall at rehab and has marked deep ecchymosis on her right side.  She denies any other bleeding.  Plan: 1.   Labs today: CBC with diff, CMP. 2.Polycythemia rubra vera Clinically, she is doing well. Hematocrit22.3. Hemoglobin6.9.Platelets367,000. Hematocrit goal <= 42. Platelet goal< 400,000. Anemia secondary to bleed. Continue hydroxyurea 1000 mg on Mondays and 500 mg Tuesday - Sundays. Monitor closely secondary to acute drop in hemoglobin. 3.   Normocytic anemia  Hematocrit 31.2.  Hemoglobin 9.7.  MCV  90.2 on 10/06/2020.  Hematocrit22.3. Hemoglobin6.9. MCV 93.3 today.  Acute drop in hemoglobin likely related to fall on Eliquis.   Patient has marked deep ecchymosis.   Possible retroperitoneal hemorrhage.    Consider non-contrast abdomen and pelvis CT.  Patient denies any melena, hematochezia, hematuria or vaginal bleeding.  Previously she had macrocytosis secondary to hydroxyurea.  RBCs normocytic likely secondary to iron deficiency.   Plan additional labs (ferritin, iron studies, sed rate/CRP).  Anticipate transfusion irradiated PRBCs secondary to acute blood loss. 4.   Hyperkalemia  Patient on oral potassium.  Hold potassium and follow-up labs. 5.   Transport patient to [facility] ER for evaluation of acute anemia. 6.   RTC in 1 month for MD assessment and labs (CBC with diff, CMP).   I discussed the assessment and treatment plan with the patient.  The patient was provided an opportunity to ask questions and all were answered.  The patient agreed with the plan  and demonstrated an understanding of the instructions.  The patient was advised to call back if the symptoms worsen or if the condition fails to improve as anticipated.  I provided 17 minutes of face-to-face time during this this encounter and > 50% was spent counseling as documented under my assessment and plan.  An additional 15 minutes were spent reviewing her chart ([facility] and [facility]) including notes, labs, and imaging studies as well as coordinating care to the ER.   Lequita Asal, MD, PhD    10/14/2020, 10:49 AM  I, Mirian Mo Tufford, am acting as Education administrator for [employer]. Mike Gip, MD, PhD.  I, Vicki Pasqual C. Mike Gip, MD, have reviewed the above documentation for accuracy and completeness, and I agree with the above.

## 2020-10-13 NOTE — Telephone Encounter (Signed)
Patient did not answer phone call. I was calling in regards to her medication refill and to ask how does does she go about taking the medications. Patient didn't answer.

## 2020-10-14 ENCOUNTER — Other Ambulatory Visit: Payer: Self-pay

## 2020-10-14 ENCOUNTER — Telehealth: Payer: Self-pay | Admitting: *Deleted

## 2020-10-14 ENCOUNTER — Inpatient Hospital Stay: Payer: Medicare Other | Attending: Hematology and Oncology | Admitting: Hematology and Oncology

## 2020-10-14 ENCOUNTER — Inpatient Hospital Stay
Admission: EM | Admit: 2020-10-14 | Discharge: 2020-10-19 | DRG: 605 | Disposition: A | Discharge status: Skilled Nursing Facility | Payer: Medicare Other | Source: Ambulatory Visit | Attending: Internal Medicine | Admitting: Internal Medicine

## 2020-10-14 ENCOUNTER — Emergency Department: Payer: Medicare Other

## 2020-10-14 ENCOUNTER — Telehealth: Payer: Self-pay

## 2020-10-14 ENCOUNTER — Inpatient Hospital Stay: Payer: Medicare Other

## 2020-10-14 ENCOUNTER — Encounter: Payer: Self-pay | Admitting: Emergency Medicine

## 2020-10-14 VITALS — BP 134/63 | HR 106 | Temp 97.9°F | Resp 19

## 2020-10-14 DIAGNOSIS — G40909 Epilepsy, unspecified, not intractable, without status epilepticus: Secondary | ICD-10-CM

## 2020-10-14 DIAGNOSIS — T1490XA Injury, unspecified, initial encounter: Secondary | ICD-10-CM

## 2020-10-14 DIAGNOSIS — D45 Polycythemia vera: Secondary | ICD-10-CM

## 2020-10-14 DIAGNOSIS — R5383 Other fatigue: Secondary | ICD-10-CM | POA: Insufficient documentation

## 2020-10-14 DIAGNOSIS — E875 Hyperkalemia: Secondary | ICD-10-CM | POA: Diagnosis present

## 2020-10-14 DIAGNOSIS — E785 Hyperlipidemia, unspecified: Secondary | ICD-10-CM | POA: Insufficient documentation

## 2020-10-14 DIAGNOSIS — J449 Chronic obstructive pulmonary disease, unspecified: Secondary | ICD-10-CM | POA: Diagnosis present

## 2020-10-14 DIAGNOSIS — Z7984 Long term (current) use of oral hypoglycemic drugs: Secondary | ICD-10-CM

## 2020-10-14 DIAGNOSIS — E1122 Type 2 diabetes mellitus with diabetic chronic kidney disease: Secondary | ICD-10-CM | POA: Diagnosis present

## 2020-10-14 DIAGNOSIS — K828 Other specified diseases of gallbladder: Secondary | ICD-10-CM | POA: Diagnosis present

## 2020-10-14 DIAGNOSIS — E876 Hypokalemia: Secondary | ICD-10-CM | POA: Diagnosis not present

## 2020-10-14 DIAGNOSIS — Z91041 Radiographic dye allergy status: Secondary | ICD-10-CM

## 2020-10-14 DIAGNOSIS — Z8249 Family history of ischemic heart disease and other diseases of the circulatory system: Secondary | ICD-10-CM

## 2020-10-14 DIAGNOSIS — R0602 Shortness of breath: Secondary | ICD-10-CM | POA: Insufficient documentation

## 2020-10-14 DIAGNOSIS — Z803 Family history of malignant neoplasm of breast: Secondary | ICD-10-CM

## 2020-10-14 DIAGNOSIS — E1129 Type 2 diabetes mellitus with other diabetic kidney complication: Secondary | ICD-10-CM

## 2020-10-14 DIAGNOSIS — Z7982 Long term (current) use of aspirin: Secondary | ICD-10-CM

## 2020-10-14 DIAGNOSIS — Z79899 Other long term (current) drug therapy: Secondary | ICD-10-CM

## 2020-10-14 DIAGNOSIS — S20211A Contusion of right front wall of thorax, initial encounter: Principal | ICD-10-CM

## 2020-10-14 DIAGNOSIS — K7460 Unspecified cirrhosis of liver: Secondary | ICD-10-CM

## 2020-10-14 DIAGNOSIS — D62 Acute posthemorrhagic anemia: Secondary | ICD-10-CM | POA: Diagnosis present

## 2020-10-14 DIAGNOSIS — D509 Iron deficiency anemia, unspecified: Secondary | ICD-10-CM | POA: Insufficient documentation

## 2020-10-14 DIAGNOSIS — Z87891 Personal history of nicotine dependence: Secondary | ICD-10-CM

## 2020-10-14 DIAGNOSIS — Z20822 Contact with and (suspected) exposure to covid-19: Secondary | ICD-10-CM | POA: Diagnosis present

## 2020-10-14 DIAGNOSIS — Z923 Personal history of irradiation: Secondary | ICD-10-CM

## 2020-10-14 DIAGNOSIS — K52839 Microscopic colitis, unspecified: Secondary | ICD-10-CM | POA: Diagnosis present

## 2020-10-14 DIAGNOSIS — Z8544 Personal history of malignant neoplasm of other female genital organs: Secondary | ICD-10-CM

## 2020-10-14 DIAGNOSIS — Y92129 Unspecified place in nursing home as the place of occurrence of the external cause: Secondary | ICD-10-CM

## 2020-10-14 DIAGNOSIS — I272 Pulmonary hypertension, unspecified: Secondary | ICD-10-CM | POA: Diagnosis present

## 2020-10-14 DIAGNOSIS — I4819 Other persistent atrial fibrillation: Secondary | ICD-10-CM | POA: Diagnosis present

## 2020-10-14 DIAGNOSIS — R188 Other ascites: Secondary | ICD-10-CM | POA: Diagnosis present

## 2020-10-14 DIAGNOSIS — I5032 Chronic diastolic (congestive) heart failure: Secondary | ICD-10-CM | POA: Diagnosis present

## 2020-10-14 DIAGNOSIS — Z9981 Dependence on supplemental oxygen: Secondary | ICD-10-CM

## 2020-10-14 DIAGNOSIS — Z833 Family history of diabetes mellitus: Secondary | ICD-10-CM

## 2020-10-14 DIAGNOSIS — E669 Obesity, unspecified: Secondary | ICD-10-CM | POA: Diagnosis present

## 2020-10-14 DIAGNOSIS — J9610 Chronic respiratory failure, unspecified whether with hypoxia or hypercapnia: Secondary | ICD-10-CM | POA: Diagnosis present

## 2020-10-14 DIAGNOSIS — Z90710 Acquired absence of both cervix and uterus: Secondary | ICD-10-CM

## 2020-10-14 DIAGNOSIS — W19XXXA Unspecified fall, initial encounter: Secondary | ICD-10-CM | POA: Diagnosis present

## 2020-10-14 DIAGNOSIS — Z888 Allergy status to other drugs, medicaments and biological substances status: Secondary | ICD-10-CM

## 2020-10-14 DIAGNOSIS — D539 Nutritional anemia, unspecified: Secondary | ICD-10-CM | POA: Diagnosis present

## 2020-10-14 DIAGNOSIS — N189 Chronic kidney disease, unspecified: Secondary | ICD-10-CM | POA: Diagnosis present

## 2020-10-14 DIAGNOSIS — E119 Type 2 diabetes mellitus without complications: Secondary | ICD-10-CM | POA: Insufficient documentation

## 2020-10-14 DIAGNOSIS — Z6836 Body mass index (BMI) 36.0-36.9, adult: Secondary | ICD-10-CM

## 2020-10-14 DIAGNOSIS — I130 Hypertensive heart and chronic kidney disease with heart failure and stage 1 through stage 4 chronic kidney disease, or unspecified chronic kidney disease: Secondary | ICD-10-CM | POA: Diagnosis present

## 2020-10-14 DIAGNOSIS — J9611 Chronic respiratory failure with hypoxia: Secondary | ICD-10-CM | POA: Diagnosis present

## 2020-10-14 DIAGNOSIS — I129 Hypertensive chronic kidney disease with stage 1 through stage 4 chronic kidney disease, or unspecified chronic kidney disease: Secondary | ICD-10-CM | POA: Insufficient documentation

## 2020-10-14 DIAGNOSIS — S20211D Contusion of right front wall of thorax, subsequent encounter: Secondary | ICD-10-CM | POA: Diagnosis not present

## 2020-10-14 DIAGNOSIS — R195 Other fecal abnormalities: Secondary | ICD-10-CM | POA: Diagnosis present

## 2020-10-14 DIAGNOSIS — Z7901 Long term (current) use of anticoagulants: Secondary | ICD-10-CM

## 2020-10-14 DIAGNOSIS — Z86010 Personal history of colonic polyps: Secondary | ICD-10-CM | POA: Insufficient documentation

## 2020-10-14 DIAGNOSIS — Z96653 Presence of artificial knee joint, bilateral: Secondary | ICD-10-CM | POA: Diagnosis present

## 2020-10-14 DIAGNOSIS — N1831 Chronic kidney disease, stage 3a: Secondary | ICD-10-CM | POA: Diagnosis present

## 2020-10-14 DIAGNOSIS — R161 Splenomegaly, not elsewhere classified: Secondary | ICD-10-CM | POA: Diagnosis present

## 2020-10-14 DIAGNOSIS — K5730 Diverticulosis of large intestine without perforation or abscess without bleeding: Secondary | ICD-10-CM | POA: Diagnosis present

## 2020-10-14 DIAGNOSIS — E8779 Other fluid overload: Secondary | ICD-10-CM | POA: Diagnosis present

## 2020-10-14 DIAGNOSIS — Z23 Encounter for immunization: Secondary | ICD-10-CM

## 2020-10-14 DIAGNOSIS — M129 Arthropathy, unspecified: Secondary | ICD-10-CM | POA: Insufficient documentation

## 2020-10-14 DIAGNOSIS — G4730 Sleep apnea, unspecified: Secondary | ICD-10-CM | POA: Insufficient documentation

## 2020-10-14 DIAGNOSIS — G4733 Obstructive sleep apnea (adult) (pediatric): Secondary | ICD-10-CM

## 2020-10-14 DIAGNOSIS — Z9989 Dependence on other enabling machines and devices: Secondary | ICD-10-CM

## 2020-10-14 DIAGNOSIS — D649 Anemia, unspecified: Secondary | ICD-10-CM | POA: Diagnosis present

## 2020-10-14 DIAGNOSIS — R609 Edema, unspecified: Secondary | ICD-10-CM

## 2020-10-14 DIAGNOSIS — Z91040 Latex allergy status: Secondary | ICD-10-CM

## 2020-10-14 DIAGNOSIS — R197 Diarrhea, unspecified: Secondary | ICD-10-CM | POA: Insufficient documentation

## 2020-10-14 DIAGNOSIS — I10 Essential (primary) hypertension: Secondary | ICD-10-CM | POA: Insufficient documentation

## 2020-10-14 LAB — COMPREHENSIVE METABOLIC PANEL
ALT: 17 U/L (ref 0–44)
ALT: 18 U/L (ref 0–44)
AST: 24 U/L (ref 15–41)
AST: 25 U/L (ref 15–41)
Albumin: 3 g/dL — ABNORMAL LOW (ref 3.5–5.0)
Albumin: 3.2 g/dL — ABNORMAL LOW (ref 3.5–5.0)
Alkaline Phosphatase: 63 U/L (ref 38–126)
Alkaline Phosphatase: 62 U/L (ref 38–126)
Anion gap: 9 (ref 5–15)
Anion gap: 8 (ref 5–15)
BUN: 33 mg/dL — ABNORMAL HIGH (ref 8–23)
BUN: 32 mg/dL — ABNORMAL HIGH (ref 8–23)
CO2: 27 mmol/L (ref 22–32)
CO2: 27 mmol/L (ref 22–32)
Calcium: 9.1 mg/dL (ref 8.9–10.3)
Calcium: 9.5 mg/dL (ref 8.9–10.3)
Chloride: 99 mmol/L (ref 98–111)
Chloride: 103 mmol/L (ref 98–111)
Creatinine, Ser: 1.88 mg/dL — ABNORMAL HIGH (ref 0.44–1.00)
Creatinine, Ser: 1.82 mg/dL — ABNORMAL HIGH (ref 0.44–1.00)
GFR, Estimated: 27 mL/min — ABNORMAL LOW (ref >60)
GFR, Estimated: 28 mL/min — ABNORMAL LOW (ref >60)
Glucose, Bld: 115 mg/dL — ABNORMAL HIGH (ref 70–99)
Glucose, Bld: 99 mg/dL (ref 70–99)
Potassium: 5.3 mmol/L — ABNORMAL HIGH (ref 3.5–5.1)
Potassium: 5.6 mmol/L — ABNORMAL HIGH (ref 3.5–5.1)
Sodium: 135 mmol/L (ref 135–145)
Sodium: 138 mmol/L (ref 135–145)
Total Bilirubin: 0.9 mg/dL (ref 0.3–1.2)
Total Bilirubin: 1.1 mg/dL (ref 0.3–1.2)
Total Protein: 5.8 g/dL — ABNORMAL LOW (ref 6.5–8.1)
Total Protein: 6.1 g/dL — ABNORMAL LOW (ref 6.5–8.1)

## 2020-10-14 LAB — CBC WITH DIFFERENTIAL/PLATELET
Abs Immature Granulocytes: 0.18 10*3/uL — ABNORMAL HIGH (ref 0.00–0.07)
Abs Immature Granulocytes: 0.16 10*3/uL — ABNORMAL HIGH (ref 0.00–0.07)
Basophils Absolute: 0.1 10*3/uL (ref 0.0–0.1)
Basophils Absolute: 0.1 10*3/uL (ref 0.0–0.1)
Basophils Relative: 1 %
Basophils Relative: 1 %
Eosinophils Absolute: 0.3 10*3/uL (ref 0.0–0.5)
Eosinophils Absolute: 0.3 10*3/uL (ref 0.0–0.5)
Eosinophils Relative: 2 %
Eosinophils Relative: 3 %
HCT: 22.3 % — ABNORMAL LOW (ref 36.0–46.0)
HCT: 23 % — ABNORMAL LOW (ref 36.0–46.0)
Hemoglobin: 6.9 g/dL — ABNORMAL LOW (ref 12.0–15.0)
Hemoglobin: 7.1 g/dL — ABNORMAL LOW (ref 12.0–15.0)
Immature Granulocytes: 1 %
Immature Granulocytes: 1 %
Lymphocytes Relative: 3 %
Lymphocytes Relative: 3 %
Lymphs Abs: 0.4 10*3/uL — ABNORMAL LOW (ref 0.7–4.0)
Lymphs Abs: 0.4 10*3/uL — ABNORMAL LOW (ref 0.7–4.0)
MCH: 28.9 pg (ref 26.0–34.0)
MCH: 29 pg (ref 26.0–34.0)
MCHC: 30.9 g/dL (ref 30.0–36.0)
MCHC: 30.9 g/dL (ref 30.0–36.0)
MCV: 93.3 fL (ref 80.0–100.0)
MCV: 93.9 fL (ref 80.0–100.0)
Monocytes Absolute: 1.1 10*3/uL — ABNORMAL HIGH (ref 0.1–1.0)
Monocytes Absolute: 1 10*3/uL (ref 0.1–1.0)
Monocytes Relative: 8 %
Monocytes Relative: 7 %
Neutro Abs: 11.8 10*3/uL — ABNORMAL HIGH (ref 1.7–7.7)
Neutro Abs: 11.6 10*3/uL — ABNORMAL HIGH (ref 1.7–7.7)
Neutrophils Relative %: 85 %
Neutrophils Relative %: 85 %
Platelets: 367 10*3/uL (ref 150–400)
Platelets: 384 10*3/uL (ref 150–400)
RBC: 2.39 MIL/uL — ABNORMAL LOW (ref 3.87–5.11)
RBC: 2.45 MIL/uL — ABNORMAL LOW (ref 3.87–5.11)
RDW: 19.9 % — ABNORMAL HIGH (ref 11.5–15.5)
RDW: 19.8 % — ABNORMAL HIGH (ref 11.5–15.5)
WBC: 13.8 10*3/uL — ABNORMAL HIGH (ref 4.0–10.5)
WBC: 13.5 10*3/uL — ABNORMAL HIGH (ref 4.0–10.5)
nRBC: 0 % (ref 0.0–0.2)
nRBC: 0 % (ref 0.0–0.2)

## 2020-10-14 LAB — RESP PANEL BY RT-PCR (FLU A&B, COVID) ARPGX2
Influenza A by PCR: NEGATIVE
Influenza B by PCR: NEGATIVE
SARS Coronavirus 2 by RT PCR: NEGATIVE

## 2020-10-14 LAB — IRON AND TIBC
Iron: 21 ug/dL — ABNORMAL LOW (ref 28–170)
Saturation Ratios: 6 % — ABNORMAL LOW (ref 10.4–31.8)
TIBC: 340 ug/dL (ref 250–450)
UIBC: 319 ug/dL

## 2020-10-14 LAB — SEDIMENTATION RATE: Sed Rate: 9 mm/hr (ref 0–30)

## 2020-10-14 LAB — PREPARE RBC (CROSSMATCH)

## 2020-10-14 LAB — PROTIME-INR
INR: 1.7 — ABNORMAL HIGH (ref 0.8–1.2)
Prothrombin Time: 19.6 seconds — ABNORMAL HIGH (ref 11.4–15.2)

## 2020-10-14 LAB — FERRITIN: Ferritin: 45 ng/mL (ref 11–307)

## 2020-10-14 LAB — ABO/RH: ABO/RH(D): O POS

## 2020-10-14 MED ORDER — MONTELUKAST SODIUM 10 MG PO TABS
10.0000 mg | ORAL_TABLET | Freq: Every day | ORAL | Status: DC
  Administered 2020-10-16 – 2020-10-19 (×4): 10 mg via ORAL
  Filled 2020-10-14 (×4): qty 1

## 2020-10-14 MED ORDER — DEXTROSE 50 % IV SOLN
1.0000 | Freq: Once | INTRAVENOUS | Status: AC
  Administered 2020-10-14: 50 mL via INTRAVENOUS
  Filled 2020-10-14: qty 50

## 2020-10-14 MED ORDER — ONDANSETRON HCL 4 MG PO TABS: 4.0000 mg | ORAL_TABLET | Freq: Four times a day (QID) | ORAL | Status: DC | PRN

## 2020-10-14 MED ORDER — ACETAMINOPHEN 650 MG RE SUPP: 650.0000 mg | Freq: Four times a day (QID) | RECTAL | Status: DC | PRN

## 2020-10-14 MED ORDER — INSULIN ASPART 100 UNIT/ML IV SOLN
10.0000 [IU] | Freq: Once | INTRAVENOUS | Status: AC
  Administered 2020-10-14: 10 [IU] via INTRAVENOUS
  Filled 2020-10-14: qty 0.1

## 2020-10-14 MED ORDER — LEVETIRACETAM 500 MG PO TABS
500.0000 mg | ORAL_TABLET | Freq: Two times a day (BID) | ORAL | Status: DC
  Administered 2020-10-14 – 2020-10-19 (×10): 500 mg via ORAL
  Filled 2020-10-14 (×10): qty 1

## 2020-10-14 MED ORDER — ATORVASTATIN CALCIUM 20 MG PO TABS
40.0000 mg | ORAL_TABLET | Freq: Every day | ORAL | Status: DC
  Administered 2020-10-15 – 2020-10-19 (×5): 40 mg via ORAL
  Filled 2020-10-14 (×5): qty 2

## 2020-10-14 MED ORDER — SODIUM CHLORIDE 0.9% IV SOLUTION
Freq: Once | INTRAVENOUS | Status: AC
  Filled 2020-10-14: qty 250

## 2020-10-14 MED ORDER — SODIUM CHLORIDE 0.9% FLUSH
3.0000 mL | Freq: Two times a day (BID) | INTRAVENOUS | Status: DC
  Administered 2020-10-14 – 2020-10-19 (×9): 3 mL via INTRAVENOUS

## 2020-10-14 MED ORDER — FUROSEMIDE 10 MG/ML IJ SOLN
40.0000 mg | Freq: Once | INTRAMUSCULAR | Status: AC
  Administered 2020-10-14: 40 mg via INTRAVENOUS
  Filled 2020-10-14: qty 4

## 2020-10-14 MED ORDER — INSULIN ASPART 100 UNIT/ML ~~LOC~~ SOLN
0.0000 [IU] | Freq: Three times a day (TID) | SUBCUTANEOUS | Status: DC
  Administered 2020-10-18 – 2020-10-19 (×2): 2 [IU] via SUBCUTANEOUS
  Filled 2020-10-14 (×2): qty 1

## 2020-10-14 MED ORDER — FLUTICASONE PROPIONATE 50 MCG/ACT NA SUSP
2.0000 | Freq: Every day | NASAL | Status: DC
  Administered 2020-10-16 – 2020-10-18 (×3): 2 via NASAL
  Filled 2020-10-14 (×2): qty 16

## 2020-10-14 MED ORDER — SODIUM CHLORIDE 0.9% FLUSH
3.0000 mL | INTRAVENOUS | Status: DC | PRN
  Administered 2020-10-18: 3 mL via INTRAVENOUS

## 2020-10-14 MED ORDER — ACETAMINOPHEN 325 MG PO TABS
650.0000 mg | ORAL_TABLET | Freq: Four times a day (QID) | ORAL | Status: DC | PRN
  Administered 2020-10-15 – 2020-10-18 (×5): 650 mg via ORAL
  Filled 2020-10-14 (×5): qty 2

## 2020-10-14 MED ORDER — ALBUTEROL SULFATE 1.25 MG/3ML IN NEBU: 1.0000 | INHALATION_SOLUTION | Freq: Four times a day (QID) | RESPIRATORY_TRACT | Status: DC | PRN

## 2020-10-14 MED ORDER — ADULT MULTIVITAMIN W/MINERALS CH
1.0000 | ORAL_TABLET | Freq: Every day | ORAL | Status: DC
  Administered 2020-10-15 – 2020-10-19 (×5): 1 via ORAL
  Filled 2020-10-14 (×5): qty 1

## 2020-10-14 MED ORDER — FUROSEMIDE 40 MG PO TABS
40.0000 mg | ORAL_TABLET | Freq: Every day | ORAL | Status: DC
  Administered 2020-10-15 – 2020-10-17 (×3): 40 mg via ORAL
  Filled 2020-10-14 (×3): qty 1

## 2020-10-14 MED ORDER — SODIUM CHLORIDE 0.9 % IV SOLN: 250.0000 mL | INTRAVENOUS | Status: DC | PRN

## 2020-10-14 MED ORDER — UMECLIDINIUM-VILANTEROL 62.5-25 MCG/INH IN AEPB
1.0000 | INHALATION_SPRAY | Freq: Every day | RESPIRATORY_TRACT | Status: DC
  Administered 2020-10-16 – 2020-10-19 (×4): 1 via RESPIRATORY_TRACT
  Filled 2020-10-14 (×2): qty 14

## 2020-10-14 MED ORDER — HYDROXYUREA 500 MG PO CAPS: 500.0000 mg | ORAL_CAPSULE | Freq: Every day | ORAL | Status: DC

## 2020-10-14 MED ORDER — TRAZODONE HCL 100 MG PO TABS
100.0000 mg | ORAL_TABLET | Freq: Every day | ORAL | Status: DC
  Administered 2020-10-14 – 2020-10-18 (×5): 100 mg via ORAL
  Filled 2020-10-14 (×5): qty 1

## 2020-10-14 MED ORDER — DIPHENHYDRAMINE HCL 25 MG PO CAPS
25.0000 mg | ORAL_CAPSULE | Freq: Every day | ORAL | Status: DC
  Administered 2020-10-15 – 2020-10-19 (×5): 25 mg via ORAL
  Filled 2020-10-14 (×5): qty 1

## 2020-10-14 MED ORDER — ONDANSETRON HCL 4 MG/2ML IJ SOLN: 4.0000 mg | Freq: Four times a day (QID) | INTRAMUSCULAR | Status: DC | PRN

## 2020-10-14 MED ORDER — ALBUTEROL SULFATE (2.5 MG/3ML) 0.083% IN NEBU: 1.2500 mg | INHALATION_SOLUTION | Freq: Four times a day (QID) | RESPIRATORY_TRACT | Status: DC | PRN

## 2020-10-14 NOTE — ED Notes (Signed)
Admitting Provider at bedside. 

## 2020-10-14 NOTE — H&P (Addendum)
History and Physical    Angelica Juarez YWV:[id number] DOB: 1941-09-17 DOA: 10/14/2020  PCP: Sofie Hartigan, MD   Patient coming from: Home  I have personally briefly reviewed patient's old medical records in [place]  Chief Complaint: Weakness  HPI: Angelica Juarez is a 79 y.o. female with medical history significant for polycythemia vera, stage III chronic kidney disease, COPD, A. fib on anticoagulation, chronic diastolic dysfunction CHF and hypertension who was sent to the emergency room for evaluation from her hematologist office.  Patient had gone for follow-up appointment and complained of generalized weakness as well as worsening shortness of breath with mild to moderate exertion.  She also complains of feeling dizzy and lightheaded when she stands up.  She denies having any chest pain.  Patient is status post fall at the rehab facility where she resides and is noted to have significant ecchymosis on her right side involving the right lateral chest wall and breast.  Labs done at her hematologist office showed a hemoglobin of 6.9g/dl down from her baseline of 9.7g/dl on 10/06/20.  Her hematologist was concerned that she may have retroperitoneal hemorrhage and so sent the patient to the ER for further evaluation and to obtain a noncontrast CT scan of abdomen and pelvis. At baseline she is short of breath due to her COPD and also has a chronic cough productive of clear phlegm. She denies having any hematemesis, no melena stools or hematochezia. She denies having any nausea, no vomiting, no abdominal pain, no changes in her bowel habits, no urinary symptoms or any changes in her bowel habits. Labs show sodium 138, potassium 5.6, chloride 103, bicarb 27, glucose 99, BUN 32, creatinine 1.82, calcium 9.5, alkaline phosphatase 62, albumin 3.2, AST 25, ALT 18, total protein 6.1, total bilirubin 1.1, serum iron 21, total iron-binding capacity 340, ferritin 45, white count 13.5, hemoglobin 7.1,  hematocrit 23.0, MCV 93.9, RDW 384, PT 19.6, INR 1.7 CT scan of chest, abdomen and pelvis shows contusive changes along the right posterolateral chest wall and flank and lateral right breast soft tissues. Larger heterogeneous, hyperdense chest wall hematoma which appears subjacent to or possibly arising from the serrated is musculature. Concerning for acute hemorrhage in the setting of declining hemoglobin. No visible displaced rib fractures though bony demineralization may detection of subtle nondisplaced fracture. Possible fracture along the posterior right glenoid, incompletely imaged on this exam. Consider dedicated right shoulder imaging. Additional questionable cortical step-off of the superior manubrium albeit without adjacent soft tissue thickening or stranding. Correlate for point tenderness to assess for nondisplaced fracture. Ill-defined questionable hypoattenuating 1.8 cm lesion adjacent the gallbladder fossa. A rounded configuration is less suggestive of a acute traumatic liver lesion though not fully excluded. Regardless, at minimum further assessment with outpatient MRI should be considered given underlying intrinsic liver disease/cirrhosis. Non traumatic findings on the CT scan show features of volume overload/anasarca with diffuse body wall edema, bilateral pleural effusions, ascites. Possibly related to a chronic history of CHF or underlying cirrhosis. Passive atelectatic changes adjacent the pleural effusions with some more patchy opacity in the left lung base and micro nodularity in the left upper lobe and lingula which could reflect a superimposed infectious or inflammatory process. Stigmata of cirrhosis include a nodular liver, left lobe hypertrophy, and splenomegaly. 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 Mild bilateral perinephric stranding, can reflect diminished renal function  or senescent change though could correlate  for urinary symptoms and consider urinalysis as appropriate. Respiratory viral panel is still pending at the time of this H&P Twelve-lead EKG reviewed by me shows atrial fibrillation   ED Course: Patient is a 79 year old female who was sent to the emergency room by her hematologist for evaluation for symptomatic anemia.  Patient noted to have a drop in her H&H from 9.7g/dl to 6.9g/dl in 1 week and is status post fall with ecchymosis on her right side concerning for retroperitoneal bleed.  CT scan of chest, abdomen and pelvis was done which showed chest wall hematoma.  Patient is on chronic anticoagulation therapy with Eliquis for A. fib.  She will be referred to observation status for further evaluation.  Review of Systems: As per HPI otherwise 10 point review of systems negative.    Past Medical History:  Diagnosis Date  . Allergy    Seasonal  . Arthritis   . 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 Chronic kidney disease    chronic renal insufficiency  . Colon polyps 05/21/14  . COPD (chronic obstructive pulmonary disease) ([place])   . Diabetes mellitus without complication ([place])   . Dyspnea   . Fracture closed, humerus, shaft    right   . History of kidney stones    40 years ago  . Hyperlipidemia 04/30/14  . Hypertension   . Impingement syndrome of right shoulder 12/29/15  . Personal history of radiation therapy   . Pneumonia    hx  . Rotator cuff tear 04/21/16   right  . Seizures ([place])   . Sleep apnea     Past Surgical History:  Procedure Laterality Date  . ABDOMINAL HYSTERECTOMY    . COLONOSCOPY WITH PROPOFOL N/A 06/01/2020   Procedure: COLONOSCOPY WITH PROPOFOL;  Surgeon: Lin Landsman, MD;  Location: [facility] ENDOSCOPY;  Service: Gastroenterology;  Laterality: N/A;  . ESOPHAGOGASTRODUODENOSCOPY (EGD) WITH PROPOFOL N/A 06/01/2020   Procedure: ESOPHAGOGASTRODUODENOSCOPY (EGD) WITH PROPOFOL;  Surgeon: Lin Landsman, MD;   Location: [facility] ENDOSCOPY;  Service: Gastroenterology;  Laterality: N/A;  . 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 EYE SURGERY     bilateral cataracts  . JOINT REPLACEMENT     bilateral knee replacement  . NASAL SEPTUM SURGERY    . OOPHORECTOMY    . REPLACEMENT TOTAL KNEE Bilateral 2004  . SHOULDER ARTHROSCOPY WITH BICEPSTENOTOMY Right 05/25/2016   Procedure: SHOULDER ARTHROSCOPY WITH BICEPSTENOTOMY;  Surgeon: Leanor Kail, MD;  Location: [facility] ORS;  Service: Orthopedics;  Laterality: Right;  . SHOULDER ARTHROSCOPY WITH DISTAL CLAVICLE RESECTION Right 05/25/2016   Procedure: SHOULDER ARTHROSCOPY WITH DISTAL CLAVICLE RESECTION;  Surgeon: Leanor Kail, MD;  Location: [facility] ORS;  Service: Orthopedics;  Laterality: Right;  . SHOULDER ARTHROSCOPY WITH OPEN ROTATOR CUFF REPAIR Right 05/25/2016   Procedure: SHOULDER ARTHROSCOPY WITH OPEN ROTATOR CUFF REPAIR;  Surgeon: Leanor Kail, MD;  Location: [facility] ORS;  Service: Orthopedics;  Laterality: Right;  . SUBACROMIAL DECOMPRESSION Right 05/25/2016   Procedure: SUBACROMIAL DECOMPRESSION;  Surgeon: Leanor Kail, MD;  Location: [facility] ORS;  Service: Orthopedics;  Laterality: Right;     reports that she quit smoking about 15 years ago. She has a 40.00 pack-year smoking history. She has never used smokeless tobacco. She reports that she does not drink alcohol and does not use drugs.  Allergies  Allergen Reactions  . Iodinated Diagnostic Agents Anaphylaxis    Other reaction(s): Other (See Comments) Throat swells and extreme hives  . Latex Itching  .  Phenobarbital Hives  . Tape Rash    silicones    Family History  Problem Relation Age of Onset  . Breast cancer Paternal Aunt 62  . Diabetes Father   . Heart disease Father   . Breast cancer Daughter 41     Prior to Admission medications   Medication Sig Start Date End Date Taking? Authorizing Provider  albuterol (ACCUNEB) 1.25 MG/3ML nebulizer solution Take 3 mLs (1.25 mg  total) by nebulization every 6 (six) hours as needed. wheezing 05/03/19   Allyne Gee, MD  apixaban (ELIQUIS) 5 MG TABS tablet Take 1 tablet (5 mg total) by mouth 2 (two) times daily. 06/04/20   Lin Landsman, MD  aspirin 81 MG tablet Take 81 mg by mouth daily.    [provider]  atorvastatin (LIPITOR) 40 MG tablet Take 40 mg by mouth daily. 04/04/16   [provider]  diphenhydrAMINE (BENADRYL) 25 mg capsule Take 25 mg by mouth daily.    [provider]  fluticasone (FLONASE) 50 MCG/ACT nasal spray Place 2 sprays into both nostrils daily. 08/08/19   Kendell Bane, NP  furosemide (LASIX) 40 MG tablet Take 1 tablet (40 mg total) by mouth daily. 09/14/20   Lin Landsman, MD  hydroxyurea (HYDREA) 500 MG capsule Take 1 pill twice a day on Mondays only and 1 pill a day by mouth on Tuesdays-Sundays. May take with food to minimize GI side effects. 03/19/20   Lequita Asal, MD  levETIRAcetam (KEPPRA) 500 MG tablet Take 500 mg by mouth 2 (two) times daily. 06/05/20   [provider]  metFORMIN (GLUCOPHAGE) 500 MG tablet Take 500 mg by mouth 2 (two) times daily with a meal. 12/25/15   [provider]  montelukast (SINGULAIR) 10 MG tablet TAKE 1 TABLET BY MOUTH EVERY DAY Patient taking differently: Take 10 mg by mouth daily.  07/02/15   Kathrine Haddock, NP  Multiple Vitamin (MULTI-VITAMINS) TABS Take 1 tablet by mouth daily.    [provider]  NIFEdipine (NIFEDICAL XL) 30 MG 24 hr tablet Take 30 mg by mouth daily. 09/30/15   [provider]  OXYGEN Inhale 2 L into the lungs.    [provider]  potassium chloride SA (KLOR-CON) 20 MEQ tablet Take 20 mEq by mouth daily. 08/23/20   [provider]  sitaGLIPtin (JANUVIA) 25 MG tablet Take 1 tablet by mouth daily. 01/01/18   [provider]  traZODone (DESYREL) 100 MG tablet Take by mouth. 07/22/20   [provider]  umeclidinium-vilanterol (ANORO  ELLIPTA) 62.5-25 MCG/INH AEPB Inhale 1 puff into the lungs daily. 02/06/20   Kendell Bane, NP  valsartan-hydrochlorothiazide (DIOVAN-HCT) 320-25 MG tablet Take 1 tablet by mouth daily. 04/14/16   [provider]  VENTOLIN HFA 108 (90 Base) MCG/ACT inhaler INHALE 2 PUFFS INTO THE LUNGS EVERY 6 HOURS AS NEEDED Patient taking differently: Inhale 2 puffs into the lungs every 6 (six) hours as needed for wheezing or shortness of breath.  02/18/19   Kendell Bane, NP    Physical Exam: Vitals:   10/14/20 1720 10/14/20 1835 10/14/20 1845 10/14/20 1850  BP: (!) 143/51 (!) 148/54  (!) 145/39  Pulse: (!) 101 (!) 106 96 (!) 105  Resp: 16 18 (!) 24 20  Temp:  97.7 F (36.5 C)  98.2 F (36.8 C)  TempSrc:  Oral  Oral  SpO2: 100% 100% 100% 100%  Weight:      Height:  Vitals:   10/14/20 1720 10/14/20 1835 10/14/20 1845 10/14/20 1850  BP: (!) 143/51 (!) 148/54  (!) 145/39  Pulse: (!) 101 (!) 106 96 (!) 105  Resp: 16 18 (!) 24 20  Temp:  97.7 F (36.5 C)  98.2 F (36.8 C)  TempSrc:  Oral  Oral  SpO2: 100% 100% 100% 100%  Weight:      Height:        Constitutional: NAD, alert and oriented x 3.  Chronically ill-appearing Eyes: PERRL, lids and conjunctivae normal ENMT: Mucous membranes are moist.  Neck: normal, supple, no masses, no thyromegaly Respiratory: clear to auscultation bilaterally, no wheezing, no crackles. Normal respiratory effort. No accessory muscle use.  Cardiovascular: Irregularly irregular, no murmurs / rubs / gallops. 1+ extremity edema. 2+ pedal pulses. No carotid bruits.  Abdomen: no tenderness, no masses palpated. No hepatosplenomegaly. Bowel sounds positive.  Central adiposity Musculoskeletal: no clubbing / cyanosis. No joint deformity upper and lower extremities.  Skin: no rashes, lesions, ulcers.  Ecchymosis over right breast and right lateral chest wall Neurologic: No gross focal neurologic deficit. Psychiatric: Normal mood and affect.   Labs on  Admission: I have personally reviewed following labs and imaging studies  CBC: Recent Labs  Lab 10/14/20 0932 10/14/20 1330  WBC 13.8* 13.5*  NEUTROABS 11.8* 11.6*  HGB 6.9* 7.1*  HCT 22.3* 23.0*  MCV 93.3 93.9  PLT 367 161   Basic Metabolic Panel: Recent Labs  Lab 10/14/20 0932 10/14/20 1330  NA 135 138  K 5.3* 5.6*  CL 99 103  CO2 27 27  GLUCOSE 115* 99  BUN 33* 32*  CREATININE 1.88* 1.82*  CALCIUM 9.1 9.5   GFR: Estimated Creatinine Clearance: 32.2 mL/min (A) (by C-G formula based on SCr of 1.82 mg/dL (H)). Liver Function Tests: Recent Labs  Lab 10/14/20 0932 10/14/20 1330  AST 24 25  ALT 17 18  ALKPHOS 63 62  BILITOT 0.9 1.1  PROT 5.8* 6.1*  ALBUMIN 3.0* 3.2*   No results for input(s): LIPASE, AMYLASE in the last 168 hours. No results for input(s): AMMONIA in the last 168 hours. Coagulation Profile: Recent Labs  Lab 10/14/20 1634  INR 1.7*   Cardiac Enzymes: No results for input(s): CKTOTAL, CKMB, CKMBINDEX, TROPONINI in the last 168 hours. BNP (last 3 results) No results for input(s): PROBNP in the last 8760 hours. HbA1C: No results for input(s): HGBA1C in the last 72 hours. CBG: No results for input(s): GLUCAP in the last 168 hours. Lipid Profile: No results for input(s): CHOL, HDL, LDLCALC, TRIG, CHOLHDL, LDLDIRECT in the last 72 hours. Thyroid Function Tests: No results for input(s): TSH, T4TOTAL, FREET4, T3FREE, THYROIDAB in the last 72 hours. Anemia Panel: Recent Labs    10/14/20 1330  FERRITIN 45  TIBC 340  IRON 21*   Urine analysis:    Component Value Date/Time   COLORURINE YELLOW (A) 06/01/2019 1046   APPEARANCEUR HAZY (A) 06/01/2019 1046   LABSPEC 1.006 06/01/2019 1046   PHURINE 6.0 06/01/2019 1046   GLUCOSEU NEGATIVE 06/01/2019 1046   HGBUR NEGATIVE 06/01/2019 [place] 06/01/2019 [place] 06/01/2019 1046   PROTEINUR 30 (A) 06/01/2019 1046   NITRITE NEGATIVE 06/01/2019 1046    LEUKOCYTESUR TRACE (A) 06/01/2019 1046    Radiological Exams on Admission: DG Shoulder Right  Result Date: 10/14/2020 CLINICAL DATA:  [place], bruising, glenoid abnormality on CT EXAM: RIGHT SHOULDER - 2+ VIEW COMPARISON:  10/14/2020 FINDINGS: Internal rotation, external rotation, and transscapular  views of the right shoulder are obtained. There are no acute displaced fractures. Severe right shoulder osteoarthritis is seen with glenohumeral and acromioclavicular joint hypertrophic changes. Veiling opacity at the right lung base consistent with known pleural effusion. IMPRESSION: 1. No acute displaced fracture. 2. Severe right shoulder osteoarthritis. 3. Right pleural effusion. Electronically Signed   By: Randa Ngo M.D.   On: 10/14/2020 18:55   CT CHEST ABDOMEN PELVIS WO CONTRAST  Result Date: 10/14/2020 CLINICAL DATA:  Fall, low hemoglobin and hematocrit, bruising in the right upper back and right shoulder pain. History of fallopian tube malignancy noted during hysterectomy. History of CHF, COPD, CKD EXAM: CT CHEST, ABDOMEN AND PELVIS WITHOUT CONTRAST TECHNIQUE: Multidetector CT imaging of the chest, abdomen and pelvis was performed following the standard protocol without IV contrast. COMPARISON:  CT 01/16/2020, 11/16/2012, renal ultrasound 10/04/2020 FINDINGS: CT CHEST FINDINGS Cardiovascular: Limited assessment of the vasculature in the absence of contrast media. Cardiomegaly with predominantly right atrial enlargement. Hypoattenuation of cardiac blood pool compatible with anemia. Three-vessel coronary artery atherosclerosis. Trace pericardial effusion. Atherosclerotic plaque within the normal caliber aorta. Shared origin of the brachiocephalic and left common carotid arteries. Calcification of the proximal great vessels as well. Central pulmonary arteries are normal caliber. Luminal assessment precluded. No major venous abnormalities are seen. Mediastinum/Nodes: No mediastinal fluid or gas. Normal  thyroid gland and thoracic inlet. No acute abnormality of the trachea or esophagus. No worrisome mediastinal or axillary adenopathy. Hilar nodal evaluation is limited in the absence of intravenous contrast media. Lungs/Pleura: Low-attenuation bilateral pleural effusions. Adjacent areas of passive atelectasis. Mild airways thickening. Some additional patchy opacities are present in the partially atelectatic left lower lobe, underlying aspiration or airspace disease not excluded. No pneumothorax. Background of centrilobular predominant emphysematous changes. Clustered micro nodules are seen in the anterior segment left upper lobe and lingula, possibly infectious or inflammatory. No concerning pulmonary nodules or masses. Musculoskeletal: Soft tissue swelling and contusive changes along the right lateral chest wall. There is a lobular hyperdense collection along the right chest wall concerning for an actively bleeding hematoma in the setting of this noncontrast CT. This measures up to 3.9 cm in maximal thickness extending 14.5 cm in AP diameter and 17.5 cm in craniocaudal extent. Majority of this collection appears subjacent to the serve radius though there is loss of discernible fat plane suggesting a possible intramuscular source. Portions of the shoulders are collimated from view. There is questionable cortical step-off along the posterior aspect of the glenoid in the right shoulder seen on the first images of the sequences (2/1), and a glenoid injury is not fully excluded. Consider dedicated imaging. Additionally, sagittal reconstruction there is a mild cortical step-off along the superior aspect of the manubrium albeit without adjacent soft tissue swelling thickening. Possibly related to step artifact though a minimally displaced fracture is certainly possible. Correlate point tenderness. No visible displaced rib fractures. Several remote appearing deformities of the left anterolateral lower thoracic ribs, several  seen on comparison from March 2021. Multilevel degenerative changes are present in the imaged portions of the spine. Multilevel flowing anterior osteophytosis, compatible with features of diffuse idiopathic skeletal hyperostosis (DISH). Intraosseous hemangioma noted at T12. Additional degenerative changes in the shoulders. CT ABDOMEN PELVIS FINDINGS Hepatobiliary: Possible hypoattenuating lesion abutting the gallbladder fossa (2/66) measuring approximately 1.8 cm in size, incompletely characterized on this unenhanced CT with diminished imaging quality due to motion artifact. Hypertrophy of the left lobe liver with a nodular liver surface contour suggestive of cirrhotic changes. Normal  liver attenuation. Normal gallbladder distention. No visible calcified gallstones. Stable surgical clips near the gallbladder fundus. Pericholecystic fluid may reflect redistributed ascites. No biliary ductal dilatation. Pancreas: Partial fatty replacement of the pancreas. No pancreatic ductal dilatation or surrounding inflammatory changes. Spleen: Splenomegaly. No concerning focal splenic lesions. Capsular calcification along the posterior spleen (2/64) stable from prior may reflect sequela of prior injury. Adrenals/Urinary Tract: Macroscopic fat containing lesion in the medial limb of the left adrenal gland suggestive of myelolipoma while a more intermediate to low-attenuation lesion in the body could reflect small adrenal adenoma. Overall stable appearance from prior accounting for motion artifact and differences in technique. No concerning right adrenal lesion. Redemonstration of a 4.5 cm fluid attenuation cyst in the upper pole left kidney, not significantly changed from prior. Stable mild bilateral perinephric stranding is nonspecific can be seen with advanced age or diminished renal function. No new concerning focal renal lesion. No urolithiasis or hydronephrosis. Urinary bladder is unremarkable for the degree of distention.  Stomach/Bowel: Distal esophagus, stomach and duodenal sweep are unremarkable. No small bowel wall thickening or dilatation. No evidence of obstruction. Appendix is not visualized. No focal inflammation the vicinity of the cecum to suggest an occult appendicitis. Cecum displaced to the midline pelvis without frank volvulus. Moderate colonic stool burden. No colonic dilatation or wall thickening. Scattered colonic diverticula without focal inflammation to suggest diverticulitis. Vascular/Lymphatic: No worrisome abdominopelvic adenopathy. Multiple surgical clips in the retroperitoneum likely reflecting prior nodal dissection. Extensive atherosclerotic calcification is similar to prior. Luminal evaluation severely limited in the absence of contrast media. Reproductive: Uterus is surgically absent. No concerning adnexal lesions. No concerning adnexal lesions are identified. Other: Extensive circumferential body wall edema. More focal soft tissue thickening of the right chest wall and flank may reflect some additional contusive change in the setting of recent fall. No traumatic abdominal wall dehiscence. No bowel containing hernias. Postsurgical changes from low vertical midline incision. Musculoskeletal: No acute fracture or traumatic osseous injury of the lumbar spine or bony pelvis. Proximal femora are intact and normally located. Unchanged grade 1 anterolisthesis L4 on 5. Multilevel degenerative changes are present in the imaged portions of the spine. Additional degenerative changes throughout the hips and pelvis. No worrisome osseous lesions. IMPRESSION: Traumatic Findings: 1. Contusive changes along the right posterolateral chest wall and flank and lateral right breast soft tissues. Larger heterogeneous, hyperdense chest wall hematoma which appears subjacent to or possibly arising from the serrated is musculature. Concerning for acute hemorrhage in the setting of declining hemoglobin. No visible displaced rib  fractures though bony demineralization may detection of subtle nondisplaced fracture. 2. Possible fracture along the posterior right glenoid, incompletely imaged on this exam. Consider dedicated right shoulder imaging. 3. Additional questionable cortical step-off of the superior manubrium albeit without adjacent soft tissue thickening or stranding. Correlate for point tenderness to assess for nondisplaced fracture. 4. Unenhanced CT was performed per clinician order. Lack of IV contrast limits sensitivity and specificity, especially for evaluation of abdominal/pelvic solid viscera. 5. Ill-defined questionable hypoattenuating 1.8 cm lesion adjacent the gallbladder fossa. A rounded configuration is less suggestive of a acute traumatic liver lesion though not fully excluded. Regardless, at minimum further assessment with outpatient MRI should be considered given underlying intrinsic liver disease/cirrhosis. Nontraumatic Findings: 1. Features of volume overload/anasarca with diffuse body wall edema, bilateral pleural effusions, ascites. Possibly related to a chronic history of CHF or underlying cirrhosis. 2. Passive atelectatic changes adjacent the pleural effusions with some more patchy opacity in the left lung  base and micro nodularity in the left upper lobe and lingula which could reflect a superimposed infectious or inflammatory process. 3. Stigmata of cirrhosis include a nodular liver, left lobe hypertrophy, and splenomegaly. 4. 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 5. Mild bilateral perinephric stranding, can reflect diminished renal function or senescent change though could correlate for urinary symptoms and consider urinalysis as appropriate. 6. Postsurgical changes from prior retroperitoneal nodal dissection. 7. Prior hysterectomy. 8. Cardiomegaly, three-vessel coronary artery atherosclerosis. 9.  Aortic  Atherosclerosis (ICD10-I70.0). 10.  Emphysema (ICD10-J43.9). These results were called by telephone at the time of interpretation on 10/14/2020 at 5:44 pm to provider [facility] [place] , who verbally acknowledged these results. Electronically Signed   By: Lovena Le M.D.   On: 10/14/2020 17:47    EKG: Independently reviewed.  Atrial Fibrillation Right axis deviation  Assessment/Plan Principal Problem:   Acute blood loss anemia Active Problems:   COPD (chronic obstructive pulmonary disease) (HCC)   Chronic kidney disease, stage 3a   Hypertension   OSA on CPAP   Seizure disorder (HCC)   Polycythemia rubra vera (HCC)   Other persistent atrial fibrillation (HCC)   Chronic kidney disease   Hyperkalemia   Chest wall hematoma, right, subsequent encounter   Chronic respiratory failure (HCC)     Symptomatic anemia Following acute blood loss related to a traumatic fall with chest wall contusion and hematoma of anterior chest wall Patient noted to have a drop in her hemoglobin from 9.7g/dl to 6.9g/dl We will type and crossmatch and will transfuse 1 unit of irradiated packed RBC Monitor serial H&H Hold aspirin and Eliquis   Hyperkalemia Related to angiotensin receptor blocking agents as well as oral potassium supplementation Hold valsartan and potassium supplements at this time   Seizure disorder Continue Keppra Place patient on seizure prophylaxis    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 Maintain consistent carbohydrate diet Sliding scale insulin for glycemic control with Accu-Cheks before meals and at bedtime   Chronic diastolic dysfunction CHF Stable and not acutely exacerbated Continue Lasix Hold valsartan due to hyperkalemia   Chronic atrial fibrillation Rate controlled Hold Eliquis due to acute blood loss anemia requiring blood transfusion   COPD with chronic respiratory failure Not acutely exacerbated Continue oxygen  supplementation 2 L Continue as needed bronchodilator therapy as well as inhaled steroids    History of polycythemia rubra Continue Hydrea Follow-up with hematology as an outpatient   DVT prophylaxis:  SCD Code Status: Full code Family Communication: Greater than 50% of time was spent discussing patient's condition and plan of care with her at the bedside.  All questions and concerns have been addressed.  She verbalizes understanding and agrees with the plan. Disposition Plan: Back to previous home environment Consults called: None Admission status: Observation status     Blessin Kanno MD [facility]     10/14/2020, 7:31 PM

## 2020-10-14 NOTE — Progress Notes (Signed)
Left foot is aching. Doctors at the hospital told her she had an infection in her feet. She isnt sleeping or eating well. Patient is on 2L of oxygen. She had a fall in the hospital and also states that her right ribcage is sore and bruised she thinks she fell out of bed but cant remember.  Patient has bladder pressure a lot.

## 2020-10-14 NOTE — Telephone Encounter (Signed)
-----   Message from Ralene Bathe, MD sent at 10/13/2020  8:07 AM EST ----- Diagnosis 1. Skin , back PERIVASCULAR DERMATITIS WITH EOSINOPHILS, SEE DESCRIPTION 2. Direct Immunofluorescence, back NEGATIVE FOR IMMUNOREACTANTS  1- most consistent with Urticaria (hives);  vs bite reaction vs Drug reaction.  In this pt, Drug reaction most likely with bite reaction possibility in this specific location. 2- Negative DIF - this makes it LESS likely that this rash represents a primary blistering disease such as Bullous Pemphigoid.

## 2020-10-14 NOTE — ED Notes (Signed)
Patient transported to CT 

## 2020-10-14 NOTE — ED Notes (Signed)
Pt provided with sandwich tray and repositioned in bed. Denies further needs at this time.

## 2020-10-14 NOTE — ED Triage Notes (Signed)
Pt comes into the ED via EMS from [facility] in [place] with c/o low hgb ad hct. Also had a recent fall on 11/27 with bruising on her right upper back and having right shoulder pain. 123/42, CBG 121, 99% 2lNC home O2.     A/o on arrival,

## 2020-10-14 NOTE — Telephone Encounter (Signed)
Called and spoke to Angelica Juarez at [facility] and told her that dr Mike Gip has looked at her body and has several bleeding areas and her hgb. Has dropped to 6.9. she also is on blood thinner and because of that she will need to be seen in ED. Angelica Juarez will have her driver to come and pick her up and will make sure she goes to ED. Dr. Mike Gip advised of plan

## 2020-10-14 NOTE — Telephone Encounter (Signed)
Left message on mobile voicemail to return my call.

## 2020-10-14 NOTE — ED Provider Notes (Signed)
[facility] Emergency Department Provider Note ____________________________________________   First MD Initiated Contact with Patient 10/14/20 1611     (approximate)  I have reviewed the triage vital signs and the nursing notes.   HISTORY  Chief Complaint Abnormal Lab    HPI Angelica Juarez is a 79 y.o. female with PMH as noted below including polycythemia vera, chronic kidney disease, COPD, and hypertension who presents with a low hemoglobin of 6.9 noted on labs obtained at the hematology office today.  The patient reports generalized weakness and some shortness of breath.  She had a fall last week when she was in the hospital and has subsequently developed some bruising on the right side of her chest and right flank; she states it is sore but but denies any acute pain there.  She denies any melena or any other abnormal bruising or bleeding.  She is on Eliquis.   Past Medical History:  Diagnosis Date  . Allergy    Seasonal  . Arthritis   . 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 Chronic kidney disease    chronic renal insufficiency  . Colon polyps 05/21/14  . COPD (chronic obstructive pulmonary disease) ([place])   . Diabetes mellitus without complication ([place])   . Dyspnea   . Fracture closed, humerus, shaft    right   . History of kidney stones    40 years ago  . Hyperlipidemia 04/30/14  . Hypertension   . Impingement syndrome of right shoulder 12/29/15  . Personal history of radiation therapy   . Pneumonia    hx  . Rotator cuff tear 04/21/16   right  . Seizures ([place])   . Sleep apnea     Patient Active Problem List   Diagnosis Date Noted  . Macrocytic anemia 10/14/2020  . Acute blood loss anemia 10/14/2020  . Chronic respiratory failure ([place]) 10/14/2020  . Chronic kidney disease   . Hyperkalemia   . Chest wall hematoma, right, subsequent encounter   . Acute on chronic diastolic CHF (congestive heart failure) ([place]) 09/29/2020  . Cellulitis  of left leg 09/28/2020  . Rash and nonspecific skin eruption 09/28/2020  . Left leg cellulitis 09/28/2020  . Normocytic anemia 06/11/2020  . COPD with acute exacerbation ([place]) 06/11/2020  . Chronic diarrhea of unknown origin   . Acute left flank pain 01/15/2020  . Hypokalemia 11/14/2019  . Proteinuria 08/09/2019  . Acute on chronic congestive heart failure ([place]) 07/09/2019  . Nonrheumatic mitral valve regurgitation 07/08/2019  . Other persistent atrial fibrillation ([place]) 07/08/2019  . C. difficile diarrhea 11/24/2018  . Diarrhea 11/18/2018  . Nausea without vomiting 11/18/2018  . Goals of care, counseling/discussion 09/12/2018  . Polycythemia rubra vera ([place]) 08/08/2018  . History of repair of right rotator cuff 08/25/2016  . Acute respiratory failure with hypoxia ([place]) 08/06/2016  . Nontraumatic tear of right rotator cuff 04/21/2016  . Atypical chest pain 04/07/2016  . Impingement syndrome of right shoulder 12/29/2015  . Primary osteoarthritis of first carpometacarpal joint of left hand 10/27/2015  . Right shoulder pain 10/27/2015  . OSA on CPAP 10/14/2015  . COPD (chronic obstructive pulmonary disease) ([place]) 07/13/2015  . Type 2 diabetes mellitus with microalbuminuria, without long-term current use of insulin ([place]) 07/13/2015  . Hypertension 03/05/2015  . Difficulty sleeping 11/26/2014  . Chronic kidney disease, stage 3a 07/31/2014  . Seizure disorder ([place]) 07/31/2014  . History of colonic polyps 05/21/2014  . Hyperlipidemia, unspecified 04/30/2014  Past Surgical History:  Procedure Laterality Date  . ABDOMINAL HYSTERECTOMY    . COLONOSCOPY WITH PROPOFOL N/A 06/01/2020   Procedure: COLONOSCOPY WITH PROPOFOL;  Surgeon: Lin Landsman, MD;  Location: [facility] ENDOSCOPY;  Service: Gastroenterology;  Laterality: N/A;  . ESOPHAGOGASTRODUODENOSCOPY (EGD) WITH PROPOFOL N/A 06/01/2020   Procedure: ESOPHAGOGASTRODUODENOSCOPY (EGD) WITH PROPOFOL;  Surgeon: Lin Landsman, MD;   Location: [facility] ENDOSCOPY;  Service: Gastroenterology;  Laterality: N/A;  . 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 EYE SURGERY     bilateral cataracts  . JOINT REPLACEMENT     bilateral knee replacement  . NASAL SEPTUM SURGERY    . OOPHORECTOMY    . REPLACEMENT TOTAL KNEE Bilateral 2004  . SHOULDER ARTHROSCOPY WITH BICEPSTENOTOMY Right 05/25/2016   Procedure: SHOULDER ARTHROSCOPY WITH BICEPSTENOTOMY;  Surgeon: Leanor Kail, MD;  Location: [facility] ORS;  Service: Orthopedics;  Laterality: Right;  . SHOULDER ARTHROSCOPY WITH DISTAL CLAVICLE RESECTION Right 05/25/2016   Procedure: SHOULDER ARTHROSCOPY WITH DISTAL CLAVICLE RESECTION;  Surgeon: Leanor Kail, MD;  Location: [facility] ORS;  Service: Orthopedics;  Laterality: Right;  . SHOULDER ARTHROSCOPY WITH OPEN ROTATOR CUFF REPAIR Right 05/25/2016   Procedure: SHOULDER ARTHROSCOPY WITH OPEN ROTATOR CUFF REPAIR;  Surgeon: Leanor Kail, MD;  Location: [facility] ORS;  Service: Orthopedics;  Laterality: Right;  . SUBACROMIAL DECOMPRESSION Right 05/25/2016   Procedure: SUBACROMIAL DECOMPRESSION;  Surgeon: Leanor Kail, MD;  Location: [facility] ORS;  Service: Orthopedics;  Laterality: Right;    Prior to Admission medications   Medication Sig Start Date End Date Taking? Authorizing Provider  albuterol (ACCUNEB) 1.25 MG/3ML nebulizer solution Take 3 mLs (1.25 mg total) by nebulization every 6 (six) hours as needed. wheezing 05/03/19   Allyne Gee, MD  apixaban (ELIQUIS) 5 MG TABS tablet Take 1 tablet (5 mg total) by mouth 2 (two) times daily. 06/04/20   Lin Landsman, MD  aspirin 81 MG tablet Take 81 mg by mouth daily.    [provider]  atorvastatin (LIPITOR) 40 MG tablet Take 40 mg by mouth daily. 04/04/16   [provider]  diphenhydrAMINE (BENADRYL) 25 mg capsule Take 25 mg by mouth daily.    [provider]  fluticasone (FLONASE) 50 MCG/ACT nasal spray Place 2 sprays into both nostrils daily. 08/08/19    Kendell Bane, NP  furosemide (LASIX) 40 MG tablet Take 1 tablet (40 mg total) by mouth daily. 09/14/20   Lin Landsman, MD  hydroxyurea (HYDREA) 500 MG capsule Take 1 pill twice a day on Mondays only and 1 pill a day by mouth on Tuesdays-Sundays. May take with food to minimize GI side effects. 03/19/20   Lequita Asal, MD  levETIRAcetam (KEPPRA) 500 MG tablet Take 500 mg by mouth 2 (two) times daily. 06/05/20   [provider]  metFORMIN (GLUCOPHAGE) 500 MG tablet Take 500 mg by mouth 2 (two) times daily with a meal. 12/25/15   [provider]  montelukast (SINGULAIR) 10 MG tablet TAKE 1 TABLET BY MOUTH EVERY DAY Patient taking differently: Take 10 mg by mouth daily.  07/02/15   Kathrine Haddock, NP  Multiple Vitamin (MULTI-VITAMINS) TABS Take 1 tablet by mouth daily.    [provider]  NIFEdipine (NIFEDICAL XL) 30 MG 24 hr tablet Take 30 mg by mouth daily. 09/30/15   [provider]  OXYGEN Inhale 2 L into the lungs.    [provider]  potassium chloride SA (KLOR-CON) 20 MEQ tablet Take 20 mEq by  mouth daily. 08/23/20   [provider]  sitaGLIPtin (JANUVIA) 25 MG tablet Take 1 tablet by mouth daily. 01/01/18   [provider]  traZODone (DESYREL) 100 MG tablet Take by mouth. 07/22/20   [provider]  umeclidinium-vilanterol (ANORO ELLIPTA) 62.5-25 MCG/INH AEPB Inhale 1 puff into the lungs daily. 02/06/20   Kendell Bane, NP  valsartan-hydrochlorothiazide (DIOVAN-HCT) 320-25 MG tablet Take 1 tablet by mouth daily. 04/14/16   [provider]  VENTOLIN HFA 108 (90 Base) MCG/ACT inhaler INHALE 2 PUFFS INTO THE LUNGS EVERY 6 HOURS AS NEEDED Patient taking differently: Inhale 2 puffs into the lungs every 6 (six) hours as needed for wheezing or shortness of breath.  02/18/19   Kendell Bane, NP    Allergies Iodinated diagnostic agents, Latex, Phenobarbital, and Tape  Family History  Problem Relation Age of  Onset  . Breast cancer Paternal Aunt 47  . Diabetes Father   . Heart disease Father   . Breast cancer Daughter 81    Social History Social History   Tobacco Use  . Smoking status: Former Smoker    Packs/day: 1.00    Years: 40.00    Pack years: 40.00    Quit date: 05/12/2005    Years since quitting: 15.4  . Smokeless tobacco: Never Used  Vaping Use  . Vaping Use: Never used  Substance Use Topics  . Alcohol use: No  . Drug use: No    Review of Systems  Constitutional: No fever.  Positive for weakness. Eyes: No visual changes. ENT: No sore throat. Cardiovascular: Denies chest pain. Respiratory: Denies shortness of breath. Gastrointestinal: No vomiting or diarrhea.  Genitourinary: Negative for dysuria or hematuria.  Musculoskeletal: Negative for back pain. Skin: Negative for rash. Neurological: Negative for headache.   ____________________________________________   PHYSICAL EXAM:  VITAL SIGNS: ED Triage Vitals  Enc Vitals Group     BP 10/14/20 1324 (!) 122/92     Pulse Rate 10/14/20 1324 75     Resp 10/14/20 1324 16     Temp 10/14/20 1324 98.1 F (36.7 C)     Temp Source 10/14/20 1324 Oral     SpO2 10/14/20 1324 97 %     Weight 10/14/20 1321 230 lb 9.6 oz (104.6 kg)     Height 10/14/20 1321 5\' 7"  (1.702 m)     Head Circumference --      Peak Flow --      Pain Score 10/14/20 1320 9     Pain Loc --      Pain Edu? --      Excl. in [place]? --     Constitutional: Alert and oriented.  Somewhat weak appearing but in no acute distress. Eyes: Conjunctivae are pale.  Head: Atraumatic. Nose: No congestion/rhinnorhea. Mouth/Throat: Mucous membranes are moist.   Neck: Normal range of motion.  Cardiovascular: Normal rate, regular rhythm. Good peripheral circulation. Respiratory: Normal respiratory effort.  No retractions.  Gastrointestinal: Soft and nontender. No distention.  Genitourinary: No CVA tenderness. Musculoskeletal: No lower extremity edema.  Extremities  warm and well perfused.  Neurologic:  Normal speech and language. No gross focal neurologic deficits are appreciated.  Skin:  Skin is warm and dry. No rash noted.  Area of subacute appearing ecchymosis to right lateral chest wall, breast, and right flank extending to the back but not past midline. Psychiatric: Mood and affect are normal. Speech and behavior are normal.  ____________________________________________   LABS (all labs ordered are listed, but only  abnormal results are displayed)  Labs Reviewed  CBC WITH DIFFERENTIAL/PLATELET - Abnormal; Notable for the following components:      Result Value   WBC 13.5 (*)    RBC 2.45 (*)    Hemoglobin 7.1 (*)    HCT 23.0 (*)    RDW 19.8 (*)    Neutro Abs 11.6 (*)    Lymphs Abs 0.4 (*)    Abs Immature Granulocytes 0.16 (*)    All other components within normal limits  COMPREHENSIVE METABOLIC PANEL - Abnormal; Notable for the following components:   Potassium 5.6 (*)    BUN 32 (*)    Creatinine, Ser 1.82 (*)    Total Protein 6.1 (*)    Albumin 3.2 (*)    GFR, Estimated 28 (*)    All other components within normal limits  PROTIME-INR - Abnormal; Notable for the following components:   Prothrombin Time 19.6 (*)    INR 1.7 (*)    All other components within normal limits  IRON AND TIBC - Abnormal; Notable for the following components:   Iron 21 (*)    Saturation Ratios 6 (*)    All other components within normal limits  FERRITIN  SEDIMENTATION RATE  URINALYSIS, COMPLETE (UACMP) WITH MICROSCOPIC  C-REACTIVE PROTEIN  CBC  BASIC METABOLIC PANEL  TYPE AND SCREEN  PREPARE RBC (CROSSMATCH)  ABO/RH   ____________________________________________  EKG  ED ECG REPORT I, Arta Silence, the attending physician, personally viewed and interpreted this ECG.  Date: 10/14/2020 EKG Time: 1814 Rate: 94 Rhythm: normal sinus rhythm QRS Axis: normal Intervals: normal ST/T Wave abnormalities: Nonspecific T wave flattening Narrative  Interpretation: Low voltage QRS; no evidence of acute ischemia  ____________________________________________  RADIOLOGY  CT chest/abdomen/pelvis: Right chest wall hematoma.  Multiple other findings detailed in CT report.    ____________________________________________   PROCEDURES  Procedure(s) performed: No  Procedures  Critical Care performed: No ____________________________________________   INITIAL IMPRESSION / ASSESSMENT AND PLAN / ED COURSE  Pertinent labs & imaging results that were available during my care of the patient were reviewed by me and considered in my medical decision making (see chart for details).  79 year old female with PMH as noted above presents with a low hemoglobin of 6.9 noted on outpatient labs today.  She reports generalized weakness and also has some bruising to the right side after a recent fall.  I reviewed the past medical records in epic.  The patient was admitted earlier this month for cellulitis of the left leg treated with IV antibiotics.  She was discharged on Bactrim.  Her hemoglobin 8 days ago was 9.7.  I also reviewed Dr. Kem Parkinson note from hematology from today.  On exam currently, the patient is overall somewhat weak and pale appearing.  Her vital signs are normal except for borderline tachycardia she has a large area of ecchymosis from the right lateral chest wall down to the flank and back which appears subacute and is only minimally tender.  Etiology of the new anemia is unclear but is overall most consistent with acute blood loss.  Initial lab work-up.  Confirms a hemoglobin of 7.1.  Dr. Mike Gip recommends transfusion of irradiated PRBCs, as well as a CT abdomen to evaluate for retroperitoneal hematoma or other source of internal bleeding.  Fortunately, the patient is overall stable and I doubt a significant ongoing bleed.  Based on the results of the work-up, we will plan for  admission.  ----------------------------------------- 7:41 PM on 10/14/2020 -----------------------------------------  CT shows  primarily chest wall hematoma, but no visceral injuries to the chest or abdomen.  Although limited by the lack of IV contrast, there is no evidence of active bleeding.  The patient has no significant tenderness over the manubrium and x-ray of the right shoulder is negative for fracture.  There are numerous other chronic and subacute findings including ascites likely related to CHF.  The CT shows evidence of possible infectious process in the left lung base, however the patient clinically does not have symptoms suggestive of pneumonia.  The patient will need admission for transfusion and further monitoring.  I discussed her case with Dr. Francine Graven from the hospitalist service.  ____________________________________________   FINAL CLINICAL IMPRESSION(S) / ED DIAGNOSES  Final diagnoses:  Hematoma of right chest wall, initial encounter  Symptomatic anemia      NEW MEDICATIONS STARTED DURING THIS VISIT:  New Prescriptions   No medications on file     Note:  This document was prepared using Dragon voice recognition software and may include unintentional dictation errors.    Arta Silence, MD 10/14/20 1944

## 2020-10-14 NOTE — Telephone Encounter (Signed)
Faith called back and said that [facility] transportation will not take patients to ER. We need to call ER. EMS has come and took her to ER at 12:28. I have called [facility] and told them to come and get the wheelchair and oxygen tank

## 2020-10-15 ENCOUNTER — Ambulatory Visit: Payer: PRIVATE HEALTH INSURANCE | Admitting: Internal Medicine

## 2020-10-15 DIAGNOSIS — G4733 Obstructive sleep apnea (adult) (pediatric): Secondary | ICD-10-CM | POA: Diagnosis present

## 2020-10-15 DIAGNOSIS — E875 Hyperkalemia: Secondary | ICD-10-CM | POA: Diagnosis present

## 2020-10-15 DIAGNOSIS — K52839 Microscopic colitis, unspecified: Secondary | ICD-10-CM | POA: Diagnosis present

## 2020-10-15 DIAGNOSIS — G40909 Epilepsy, unspecified, not intractable, without status epilepticus: Secondary | ICD-10-CM | POA: Diagnosis present

## 2020-10-15 DIAGNOSIS — I5032 Chronic diastolic (congestive) heart failure: Secondary | ICD-10-CM | POA: Diagnosis present

## 2020-10-15 DIAGNOSIS — W19XXXA Unspecified fall, initial encounter: Secondary | ICD-10-CM | POA: Diagnosis present

## 2020-10-15 DIAGNOSIS — J9611 Chronic respiratory failure with hypoxia: Secondary | ICD-10-CM | POA: Diagnosis present

## 2020-10-15 DIAGNOSIS — I130 Hypertensive heart and chronic kidney disease with heart failure and stage 1 through stage 4 chronic kidney disease, or unspecified chronic kidney disease: Secondary | ICD-10-CM | POA: Diagnosis present

## 2020-10-15 DIAGNOSIS — D62 Acute posthemorrhagic anemia: Secondary | ICD-10-CM

## 2020-10-15 DIAGNOSIS — K7460 Unspecified cirrhosis of liver: Secondary | ICD-10-CM | POA: Diagnosis present

## 2020-10-15 DIAGNOSIS — I272 Pulmonary hypertension, unspecified: Secondary | ICD-10-CM | POA: Diagnosis present

## 2020-10-15 DIAGNOSIS — N1831 Chronic kidney disease, stage 3a: Secondary | ICD-10-CM

## 2020-10-15 DIAGNOSIS — I10 Essential (primary) hypertension: Secondary | ICD-10-CM

## 2020-10-15 DIAGNOSIS — Y92129 Unspecified place in nursing home as the place of occurrence of the external cause: Secondary | ICD-10-CM | POA: Diagnosis not present

## 2020-10-15 DIAGNOSIS — J449 Chronic obstructive pulmonary disease, unspecified: Secondary | ICD-10-CM | POA: Diagnosis present

## 2020-10-15 DIAGNOSIS — S20211D Contusion of right front wall of thorax, subsequent encounter: Secondary | ICD-10-CM

## 2020-10-15 DIAGNOSIS — E8779 Other fluid overload: Secondary | ICD-10-CM | POA: Diagnosis present

## 2020-10-15 DIAGNOSIS — D539 Nutritional anemia, unspecified: Secondary | ICD-10-CM | POA: Diagnosis present

## 2020-10-15 DIAGNOSIS — Z9989 Dependence on other enabling machines and devices: Secondary | ICD-10-CM

## 2020-10-15 DIAGNOSIS — E1122 Type 2 diabetes mellitus with diabetic chronic kidney disease: Secondary | ICD-10-CM | POA: Diagnosis present

## 2020-10-15 DIAGNOSIS — D649 Anemia, unspecified: Secondary | ICD-10-CM | POA: Diagnosis present

## 2020-10-15 DIAGNOSIS — J9610 Chronic respiratory failure, unspecified whether with hypoxia or hypercapnia: Secondary | ICD-10-CM

## 2020-10-15 DIAGNOSIS — Z9981 Dependence on supplemental oxygen: Secondary | ICD-10-CM | POA: Diagnosis not present

## 2020-10-15 DIAGNOSIS — E785 Hyperlipidemia, unspecified: Secondary | ICD-10-CM | POA: Diagnosis present

## 2020-10-15 DIAGNOSIS — T1490XA Injury, unspecified, initial encounter: Secondary | ICD-10-CM | POA: Diagnosis not present

## 2020-10-15 DIAGNOSIS — R188 Other ascites: Secondary | ICD-10-CM | POA: Diagnosis present

## 2020-10-15 DIAGNOSIS — I4819 Other persistent atrial fibrillation: Secondary | ICD-10-CM | POA: Diagnosis present

## 2020-10-15 DIAGNOSIS — K5730 Diverticulosis of large intestine without perforation or abscess without bleeding: Secondary | ICD-10-CM | POA: Diagnosis present

## 2020-10-15 DIAGNOSIS — Z23 Encounter for immunization: Secondary | ICD-10-CM | POA: Diagnosis not present

## 2020-10-15 DIAGNOSIS — D45 Polycythemia vera: Secondary | ICD-10-CM | POA: Diagnosis present

## 2020-10-15 DIAGNOSIS — S20211A Contusion of right front wall of thorax, initial encounter: Secondary | ICD-10-CM | POA: Diagnosis present

## 2020-10-15 DIAGNOSIS — Z20822 Contact with and (suspected) exposure to covid-19: Secondary | ICD-10-CM | POA: Diagnosis present

## 2020-10-15 LAB — BASIC METABOLIC PANEL
Anion gap: 9 (ref 5–15)
BUN: 30 mg/dL — ABNORMAL HIGH (ref 8–23)
CO2: 25 mmol/L (ref 22–32)
Calcium: 8.7 mg/dL — ABNORMAL LOW (ref 8.9–10.3)
Chloride: 105 mmol/L (ref 98–111)
Creatinine, Ser: 1.52 mg/dL — ABNORMAL HIGH (ref 0.44–1.00)
GFR, Estimated: 35 mL/min — ABNORMAL LOW (ref >60)
Glucose, Bld: 104 mg/dL — ABNORMAL HIGH (ref 70–99)
Potassium: 4.7 mmol/L (ref 3.5–5.1)
Sodium: 139 mmol/L (ref 135–145)

## 2020-10-15 LAB — URINALYSIS, COMPLETE (UACMP) WITH MICROSCOPIC
Bilirubin Urine: NEGATIVE
Glucose, UA: NEGATIVE mg/dL
Ketones, ur: NEGATIVE mg/dL
Leukocytes,Ua: NEGATIVE
Nitrite: NEGATIVE
Protein, ur: NEGATIVE mg/dL
Specific Gravity, Urine: 1.009 (ref 1.005–1.030)
pH: 5 (ref 5.0–8.0)

## 2020-10-15 LAB — CBC
HCT: 23.1 % — ABNORMAL LOW (ref 36.0–46.0)
Hemoglobin: 7.2 g/dL — ABNORMAL LOW (ref 12.0–15.0)
MCH: 29.4 pg (ref 26.0–34.0)
MCHC: 31.2 g/dL (ref 30.0–36.0)
MCV: 94.3 fL (ref 80.0–100.0)
Platelets: 305 10*3/uL (ref 150–400)
RBC: 2.45 MIL/uL — ABNORMAL LOW (ref 3.87–5.11)
RDW: 18.7 % — ABNORMAL HIGH (ref 11.5–15.5)
WBC: 11.1 10*3/uL — ABNORMAL HIGH (ref 4.0–10.5)
nRBC: 0 % (ref 0.0–0.2)

## 2020-10-15 LAB — RETICULOCYTES
Immature Retic Fract: 38.9 % — ABNORMAL HIGH (ref 2.3–15.9)
RBC.: 2.63 MIL/uL — ABNORMAL LOW (ref 3.87–5.11)
Retic Count, Absolute: 132 10*3/uL (ref 19.0–186.0)
Retic Ct Pct: 5 % — ABNORMAL HIGH (ref 0.4–3.1)

## 2020-10-15 LAB — GLUCOSE, CAPILLARY
Glucose-Capillary: 117 mg/dL — ABNORMAL HIGH (ref 70–99)
Glucose-Capillary: 99 mg/dL (ref 70–99)
Glucose-Capillary: 95 mg/dL (ref 70–99)

## 2020-10-15 LAB — C-REACTIVE PROTEIN: CRP: 2.6 mg/dL — ABNORMAL HIGH (ref <1.0)

## 2020-10-15 LAB — PREPARE RBC (CROSSMATCH)

## 2020-10-15 LAB — CBG MONITORING, ED: Glucose-Capillary: 89 mg/dL (ref 70–99)

## 2020-10-15 LAB — LACTATE DEHYDROGENASE: LDH: 178 U/L (ref 98–192)

## 2020-10-15 MED ORDER — FENTANYL CITRATE (PF) 100 MCG/2ML IJ SOLN
INTRAMUSCULAR | Status: AC
  Filled 2020-10-15: qty 2

## 2020-10-15 MED ORDER — HYDROXYUREA 500 MG PO CAPS
500.0000 mg | ORAL_CAPSULE | ORAL | Status: DC
  Administered 2020-10-19: 500 mg via ORAL
  Filled 2020-10-15 (×2): qty 1

## 2020-10-15 MED ORDER — FENTANYL CITRATE (PF) 100 MCG/2ML IJ SOLN
50.0000 ug | Freq: Once | INTRAMUSCULAR | Status: AC
  Administered 2020-10-15: 50 ug via INTRAVENOUS

## 2020-10-15 MED ORDER — FENTANYL CITRATE (PF) 100 MCG/2ML IJ SOLN: 25.0000 ug | Freq: Once | INTRAMUSCULAR | Status: DC

## 2020-10-15 MED ORDER — HYDROXYUREA 500 MG PO CAPS: 1000.0000 mg | ORAL_CAPSULE | ORAL | Status: DC

## 2020-10-15 MED ORDER — HYDROXYUREA 500 MG PO CAPS: 500.0000 mg | ORAL_CAPSULE | ORAL | Status: DC

## 2020-10-15 MED ORDER — HYDROXYUREA 500 MG PO CAPS
500.0000 mg | ORAL_CAPSULE | ORAL | Status: DC
  Administered 2020-10-16 – 2020-10-18 (×3): 500 mg via ORAL
  Filled 2020-10-15 (×5): qty 1

## 2020-10-15 MED ORDER — PNEUMOCOCCAL VAC POLYVALENT 25 MCG/0.5ML IJ INJ: 0.5000 mL | INJECTION | INTRAMUSCULAR | Status: DC

## 2020-10-15 MED ORDER — SODIUM CHLORIDE 0.9% IV SOLUTION: Freq: Once | INTRAVENOUS | Status: AC

## 2020-10-15 NOTE — Evaluation (Signed)
Physical Therapy Evaluation Patient Details Name: Angelica Juarez MRN: [id number] DOB: 1941/09/07 Today's Date: 10/15/2020   History of Present Illness  Pt is a 79yo F admitted to [facility] under observation on 10/14/20 from [facility] in [place] with c/o low hgb and hct. Pt also with c/o generalized weakness and some SOB. Pt reports having a fall last week at the rehab center, and had subsequently developed some bruising on the R side of her chest and R flank. Significant PMH includes: polycythemia vera, CKD (IIIa), COPD, HTN, OSA (on CPAP), seizure disorder, Afib, and chronic respiratory failure. Imaging shows chest wall hematoma without visceral injuries, bil pleural effusions, ascites, and possible infectious process of LL base.    Clinical Impression  Pt is a 79 year old F admitted to hospital on 10/14/20 for low hgb and hct. Pt with previous hospitalization and DC to SNF. At baseline, pt was Ind - Mod I with basic ADL's and household ambulation; spouse assisted with lower body dressing, IADL's, and driving; pt chronically on 2L O2 via [place]. Pt presents with generalized weakness, increased pain, increased O2 dependence from baseline, and decreased activity tolerance, resulting in impaired functional mobility. Due to deficits, pt required min assist for bed mobility and CGA for transfers and short distance ambulation with RW. Pt unable to tolerate mobility on 3L O2 via [place], but was able to maintain SpO2 at 90% on 4L O2 via [place]. Pt demonstrated antalgic gait during ambulation due to LLE pain which affects her balance, functional mobility, and increases her risk of falls. Decreased activity tolerance demonstrated by increased RR, labored breathing, and RPE of 4-6/10 indicating "moderate activity" post mobility, with SpO2 at 90% on 4L O2 via [place]. Deficits limit the pt's ability to safely and independently perform ADL's, transfer, and ambulate. Pt will benefit from acute skilled PT services to address deficits for  return to baseline function. At this time, PT recommends SNF at DC to improve overall safety with functional mobility prior to return home; pt agreeable.     Follow Up Recommendations SNF    Equipment Recommendations  None recommended by PT    Recommendations for Other Services OT consult     Precautions / Restrictions Precautions Precautions: Fall Restrictions Weight Bearing Restrictions: No      Mobility  Bed Mobility Overal bed mobility: Needs Assistance Bed Mobility: Supine to Sit     Supine to sit: HOB elevated;Min assist     General bed mobility comments: Min assist for trunk facilitation to perform supine>sit transfer with HOB elevated, requiring increased time/effort and UE support on hand rail.    Transfers Overall transfer level: Needs assistance Equipment used: Rolling walker (2 wheeled) Transfers: Sit to/from Stand Sit to Stand: Min guard         General transfer comment: Min guard to perform STS from EOB x1 and BSC x1 with RW. Verbal cues provided for hand placement to ensure safety with transfer. Pt required increased time/effort for power to stand.  Ambulation/Gait Ambulation/Gait assistance: Min guard Gait Distance (Feet): 13 Feet (6ft x1, 9ft x1) Assistive device: Rolling walker (2 wheeled)   Gait velocity: decreased   General Gait Details: Pt required CGA for safety to ambulate short distance with RW on 4L O2 via [place]. Pt demonstrated antalgic gait due to increased LLE pain, resulting in step to gait pattern, decreased L stance, decreased R swing, and decreased foot clearance bil. Verbal cues for RW proximity to ensure safety with mobility.  Stairs  Wheelchair Mobility    Modified Rankin (Stroke Patients Only)       Balance Overall balance assessment: Needs assistance Sitting-balance support: Feet supported;Bilateral upper extremity supported Sitting balance-Leahy Scale: Fair Sitting balance - Comments: Fair seated balance  at EOB with BUE/BLE support.   Standing balance support: Bilateral upper extremity supported;During functional activity Standing balance-Leahy Scale: Fair Standing balance comment: Fair standing balance in RW with BUE support on RW.                             Pertinent Vitals/Pain Pain Assessment: 0-10 Pain Score: 5  Pain Location: LLE Pain Descriptors / Indicators: Tender;Sore Pain Intervention(s): Limited activity within patient's tolerance;Monitored during session;Repositioned;Relaxation    Home Living Family/patient expects to be discharged to:: Private residence Living Arrangements: Spouse/significant other Available Help at Discharge: Family;Available 24 hours/day Type of Home: House Home Access: Stairs to enter Entrance Stairs-Rails: Right Entrance Stairs-Number of Steps: 3 Home Layout: One level Home Equipment: Walker - 2 wheels;Walker - 4 wheels;Shower seat Additional Comments: on 2Lnc chronically    Prior Function Level of Independence: Needs assistance   Gait / Transfers Assistance Needed: Reports using RW ~25% of the time, otherwise uses no AD for household ambulation. Uses electric cart for community ambulation at grocery store.  ADL's / Homemaking Assistance Needed: Pt is Ind-mod I with basic ADL's including bathing, toileting, grooming and upper body dressing; spouse assists with lower body dressing, driving, and IADL's. Pt notes they have housekeeper that comes weekly to clean.        Hand Dominance        Extremity/Trunk Assessment   Upper Extremity Assessment Upper Extremity Assessment: Generalized weakness    Lower Extremity Assessment Lower Extremity Assessment: Generalized weakness    Cervical / Trunk Assessment Cervical / Trunk Assessment: Normal  Communication   Communication: No difficulties  Cognition Arousal/Alertness: Awake/alert Behavior During Therapy: WFL for tasks assessed/performed Overall Cognitive Status: Within  Functional Limits for tasks assessed                                 General Comments: A&O x4 and able to follow 100% of 2 step commands      General Comments General comments (skin integrity, edema, etc.): LLE wrapped in ACE bandage, ecchymosis to R flank/chest. On 4L O2 via [place] upon entry; unable to tolerate 3L O2 via [place] at EOB, desatting to 89%. Pt able to mobilize on 4L O2 via [place] with SpO2 dropping to 90% at lowest.    Exercises Other Exercises Other Exercises: Pt able to ambulate from EOB>BSC>recliner with RW on 4L O2 via [place].   Assessment/Plan    PT Assessment Patient needs continued PT services  PT Problem List Decreased strength;Decreased range of motion;Decreased activity tolerance;Decreased balance;Decreased mobility;Decreased knowledge of use of DME;Decreased safety awareness;Decreased knowledge of precautions;Decreased skin integrity;Pain       PT Treatment Interventions DME instruction;Gait training;Functional mobility training;Therapeutic activities;Therapeutic exercise;Balance training;Patient/family education    PT Goals (Current goals can be found in the Care Plan section)  Acute Rehab PT Goals Patient Stated Goal: to get better PT Goal Formulation: With patient Time For Goal Achievement: 10/29/20 Potential to Achieve Goals: Fair    Frequency Min 2X/week   Barriers to discharge        Co-evaluation  AM-PAC PT "6 Clicks" Mobility  Outcome Measure Help needed turning from your back to your side while in a flat bed without using bedrails?: A Little Help needed moving from lying on your back to sitting on the side of a flat bed without using bedrails?: A Lot Help needed moving to and from a bed to a chair (including a wheelchair)?: A Little Help needed standing up from a chair using your arms (e.g., wheelchair or bedside chair)?: A Little Help needed to walk in hospital room?: A Little Help needed climbing 3-5 steps with a  railing? : A Lot 6 Click Score: 16    End of Session Equipment Utilized During Treatment: Gait belt;Oxygen (4L O2 via [place]) Activity Tolerance: Patient limited by pain;Patient limited by fatigue Patient left: with call bell/phone within reach;in chair;with chair alarm set;Other (comment) (lab tech in room) Nurse Communication: Mobility status PT Visit Diagnosis: Muscle weakness (generalized) (M62.81);Difficulty in walking, not elsewhere classified (R26.2);Pain Pain - Right/Left: Left Pain - part of body: Leg    Time: 1249-1313 PT Time Calculation (min) (ACUTE ONLY): 24 min   Charges:   PT Evaluation $PT Eval Moderate Complexity: 1 Mod PT Treatments $Therapeutic Activity: 8-22 mins       Herminio Commons, PT, DPT 1:39 PM,10/15/20

## 2020-10-15 NOTE — Progress Notes (Addendum)
PROGRESS NOTE  CARLISS PORCARO GHW:[id number] DOB: 1941-04-16 DOA: 10/14/2020 PCP: Sofie Hartigan, MD   LOS: 0 days   Brief narrative:  Angelica Juarez is a 79 y.o. female with medical history significant for polycythemia vera, stage III chronic kidney disease, COPD, A. fib on anticoagulation, chronic diastolic dysfunction, CHF and hypertension who was sent to the ED for evaluation of severe anemia.  Patient complained of generalized weakness and shortness of breath even on mild to moderate exertion and was dizzy and lightheaded.  She was noted to have a significant ecchymosis on the right side of her chest wall and breast area.  There was significant drop in hemoglobin to 6.9 from a baseline around 9.7 so the patient was sent into the ED.  CT scan of the abdomen and pelvis was performed to rule out retroperitoneal hematoma which was negative for internal bleed but chest wall hematoma was noted.  Patient does have baseline COPD with 2 L of oxygen at baseline. Laboratory data was significant for elevated creatinine at 1.8, iron profile with serum iron of 21 ferritin of 45.  Hemoglobin on repeat was 7.1.  EKG showed atrial fibrillation.  Of note patient was on Eliquis for atrial fibrillation as outpatient.  Patient was then admitted to hospital for further evaluation and treatment.  Assessment/Plan:  Principal Problem:   Acute blood loss anemia Active Problems:   COPD (chronic obstructive pulmonary disease) (HCC)   Chronic kidney disease, stage 3a   Hypertension   OSA on CPAP   Seizure disorder (HCC)   Polycythemia rubra vera (HCC)   Other persistent atrial fibrillation (HCC)   Chronic kidney disease   Hyperkalemia   Chest wall hematoma, right, subsequent encounter   Chronic respiratory failure (HCC)  Symptomatic anemia Patient does not really know whether she had a fall or not but there is mention of possible trauma. Patient has chest wall contusion with hematoma with drop in her hemoglobin.   Patient received 1 unit of irradiated packed RBC yesterday.  Hemoglobin is still 7.1.  Spoke with oncology Dr. Mike Gip, oncology.  Recommended transfusion to maintain at least hemoglobin of 8.  Patient also complained of  dark stools.  Will get stool occult blood to rule out GI bleed.  Patient is not a good historian.  Will need to continue to observe in the hospital.  We will continue to hold aspirin and Eliquis at this time. Patient was on Bactrim recently.  Will do LDH reticulocyte count and haptoglobin to screen for any evidence of hemolysis.  oncology to see the patient in consultation.  Hyperkalemia Continue to hold valsartan and potassium supplements at this time.  Potassium of 4.7 today.  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    Chronic diastolic dysfunction CHF Not in exacerbation.  Continue Lasix.  Hold valsartan.   Chronic atrial fibrillation Rate controlled, not on nodal blockers/beta-blocker at home.  Hold Eliquis secondary to significant anemia and bruise.  Will need to figure out about anticoagulation prior to discharge.  COPD with chronic hypoxic respiratory failure Continue oxygen by nasal cannula at 2 L/min, bronchodilators and inhaled steroids.  History of polycythemia rubra vera. Continue Hydrea.  Anemia unlkely secondary to Hydrea.  Spoke with oncology.  Oncology to follow the patient while in the hospital.   DVT prophylaxis: SCDs Start: 10/14/20 1859   Code Status: Full code  Family Communication: I tried to  call the patient's significant other Mr. Collins [place] on the phone listed but was unable to reach him and was unable to leave a voice message as well.  Status is: Observation  The patient will require care spanning > 2 midnights and should be moved to inpatient because: Unsafe d/c plan, IV treatments appropriate due to intensity of  illness or inability to take PO, Inpatient level of care appropriate due to severity of illness and Need for PRBC transfusion, closer monitoring, possible need for GI work-up.,  Dispo: The patient is from: SNF              Anticipated d/c is to: SNF              Anticipated d/c date is: 2 days              Patient currently is not medically stable to d/c.   Consultants:  Oncology  Procedures:  PRBC transfusion 2 units  Antibiotics:  . None  Anti-infectives (From admission, onward)   None     Subjective: Today, patient was seen and examined at bedside.  Patient is poor historian.  She is not completely sure whether she had a fall.  Complains of her chest wall pain.  Also complains of  dark stools.  Denies shortness of breath cough fever.  Complains of generalized weakness.  Objective: Vitals:   10/15/20 1322 10/15/20 1325  BP:    Pulse:  (!) 101  Resp:    Temp:    SpO2: 91% 91%    Intake/Output Summary (Last 24 hours) at 10/15/2020 1355 Last data filed at 10/14/2020 2023 Gross per 24 hour  Intake 450 ml  Output --  Net 450 ml   Filed Weights   10/14/20 1321  Weight: 104.6 kg   Body mass index is 36.12 kg/m.   Physical Exam: GENERAL: Patient is alert awake and communicative,. Not in obvious distress.  Obese, appears chronically ill HENT: Pallor noted.  Pupils equally reactive to light. Oral mucosa is moist NECK: is supple, no gross swelling noted. CHEST:   Diminished breath sounds bilaterally.  Significant ecchymosis over the right chest wall including breast with mild tenderness over the ribs on palpation.. CVS: S1 and S2 heard, no murmur.  Irregularly irregular rhythm.  ABDOMEN: Soft, non-tender, obese abdomen bowel sounds are present. EXTREMITIES: No edema. CNS: Cranial nerves are intact. No focal motor deficits.  Alert awake and communicative. SKIN: warm and dry without rashes.  Ecchymosis over the right breast and right lateral chest wall  Data Review: I  have personally reviewed the following laboratory data and studies,  CBC: Recent Labs  Lab 10/14/20 0932 10/14/20 1330 10/15/20 0420  WBC 13.8* 13.5* 11.1*  NEUTROABS 11.8* 11.6*  --   HGB 6.9* 7.1* 7.2*  HCT 22.3* 23.0* 23.1*  MCV 93.3 93.9 94.3  PLT 367 384 332   Basic Metabolic Panel: Recent Labs  Lab 10/14/20 0932 10/14/20 1330 10/15/20 0420  NA 135 138 139  K 5.3* 5.6* 4.7  CL 99 103 105  CO2 27 27 25   GLUCOSE 115* 99 104*  BUN 33* 32* 30*  CREATININE 1.88* 1.82* 1.52*  CALCIUM 9.1 9.5 8.7*   Liver Function Tests: Recent Labs  Lab 10/14/20 0932 10/14/20 1330  AST 24 25  ALT 17 18  ALKPHOS 63 62  BILITOT 0.9 1.1  PROT 5.8* 6.1*  ALBUMIN 3.0* 3.2*   No results for input(s): LIPASE, AMYLASE in the last 168 hours. No results for  input(s): AMMONIA in the last 168 hours. Cardiac Enzymes: No results for input(s): CKTOTAL, CKMB, CKMBINDEX, TROPONINI in the last 168 hours. BNP (last 3 results) Recent Labs    06/11/20 1645  BNP 355.2*    ProBNP (last 3 results) No results for input(s): PROBNP in the last 8760 hours.  CBG: Recent Labs  Lab 10/15/20 0738  GLUCAP 89   Recent Results (from the past 240 hour(s))  SARS Coronavirus 2 by RT PCR (hospital order, performed in [facility] hospital lab) Nasopharyngeal Nasopharyngeal Swab     Status: None   Collection Time: 10/06/20 11:45 AM   Specimen: Nasopharyngeal Swab  Result Value Ref Range Status   SARS Coronavirus 2 NEGATIVE NEGATIVE Final    Comment: (NOTE) SARS-CoV-2 target nucleic acids are NOT DETECTED.  The SARS-CoV-2 RNA is generally detectable in upper and lower respiratory specimens during the acute phase of infection. The lowest concentration of SARS-CoV-2 viral copies this assay can detect is 250 copies / mL. A negative result does not preclude SARS-CoV-2 infection and should not be used as the sole basis for treatment or other patient management decisions.  A negative result may occur  with improper specimen collection / handling, submission of specimen other than nasopharyngeal swab, presence of viral mutation(s) within the areas targeted by this assay, and inadequate number of viral copies (<250 copies / mL). A negative result must be combined with clinical observations, patient history, and epidemiological information.  Fact Sheet for Patients:   StrictlyIdeas.no  Fact Sheet for Healthcare Providers: BankingDealers.co.za  This test is not yet approved or  cleared by the [place] FDA and has been authorized for detection and/or diagnosis of SARS-CoV-2 by FDA under an Emergency Use Authorization (EUA).  This EUA will remain in effect (meaning this test can be used) for the duration of the COVID-19 declaration under Section 564(b)(1) of the Act, 21 U.S.C. section 360bbb-3(b)(1), unless the authorization is terminated or revoked sooner.  Performed at [facility], [address] [place], [place] [address]   Resp Panel by RT-PCR (Flu A&B, Covid) Nasopharyngeal Swab     Status: None   Collection Time: 10/14/20  8:32 PM   Specimen: Nasopharyngeal Swab; Nasopharyngeal(NP) swabs in vial transport medium  Result Value Ref Range Status   SARS Coronavirus 2 by RT PCR NEGATIVE NEGATIVE Final    Comment: (NOTE) SARS-CoV-2 target nucleic acids are NOT DETECTED.  The SARS-CoV-2 RNA is generally detectable in upper respiratory specimens during the acute phase of infection. The lowest concentration of SARS-CoV-2 viral copies this assay can detect is 138 copies/mL. A negative result does not preclude SARS-Cov-2 infection and should not be used as the sole basis for treatment or other patient management decisions. A negative result may occur with  improper specimen collection/handling, submission of specimen other than nasopharyngeal swab, presence of viral mutation(s) within the areas targeted by this assay, and  inadequate number of viral copies(<138 copies/mL). A negative result must be combined with clinical observations, patient history, and epidemiological information. The expected result is Negative.  Fact Sheet for Patients:  EntrepreneurPulse.com.au  Fact Sheet for Healthcare Providers:  IncredibleEmployment.be  This test is no t yet approved or cleared by the [place] FDA and  has been authorized for detection and/or diagnosis of SARS-CoV-2 by FDA under an Emergency Use Authorization (EUA). This EUA will remain  in effect (meaning this test can be used) for the duration of the COVID-19 declaration under Section 564(b)(1) of the Act, 21 U.S.C.section  360bbb-3(b)(1), unless the authorization is terminated  or revoked sooner.       Influenza A by PCR NEGATIVE NEGATIVE Final   Influenza B by PCR NEGATIVE NEGATIVE Final    Comment: (NOTE) The Xpert Xpress SARS-CoV-2/FLU/RSV plus assay is intended as an aid in the diagnosis of influenza from Nasopharyngeal swab specimens and should not be used as a sole basis for treatment. Nasal washings and aspirates are unacceptable for Xpert Xpress SARS-CoV-2/FLU/RSV testing.  Fact Sheet for Patients: EntrepreneurPulse.com.au  Fact Sheet for Healthcare Providers: IncredibleEmployment.be  This test is not yet approved or cleared by the [place] FDA and has been authorized for detection and/or diagnosis of SARS-CoV-2 by FDA under an Emergency Use Authorization (EUA). This EUA will remain in effect (meaning this test can be used) for the duration of the COVID-19 declaration under Section 564(b)(1) of the Act, 21 U.S.C. section 360bbb-3(b)(1), unless the authorization is terminated or revoked.  Performed at [facility], [address] [place], [place] [address]      Studies: DG Shoulder Right  Result Date: 10/14/2020 CLINICAL DATA:  [place],  bruising, glenoid abnormality on CT EXAM: RIGHT SHOULDER - 2+ VIEW COMPARISON:  10/14/2020 FINDINGS: Internal rotation, external rotation, and transscapular views of the right shoulder are obtained. There are no acute displaced fractures. Severe right shoulder osteoarthritis is seen with glenohumeral and acromioclavicular joint hypertrophic changes. Veiling opacity at the right lung base consistent with known pleural effusion. IMPRESSION: 1. No acute displaced fracture. 2. Severe right shoulder osteoarthritis. 3. Right pleural effusion. Electronically Signed   By: Randa Ngo M.D.   On: 10/14/2020 18:55   CT CHEST ABDOMEN PELVIS WO CONTRAST  Result Date: 10/14/2020 CLINICAL DATA:  Fall, low hemoglobin and hematocrit, bruising in the right upper back and right shoulder pain. History of fallopian tube malignancy noted during hysterectomy. History of CHF, COPD, CKD EXAM: CT CHEST, ABDOMEN AND PELVIS WITHOUT CONTRAST TECHNIQUE: Multidetector CT imaging of the chest, abdomen and pelvis was performed following the standard protocol without IV contrast. COMPARISON:  CT 01/16/2020, 11/16/2012, renal ultrasound 10/04/2020 FINDINGS: CT CHEST FINDINGS Cardiovascular: Limited assessment of the vasculature in the absence of contrast media. Cardiomegaly with predominantly right atrial enlargement. Hypoattenuation of cardiac blood pool compatible with anemia. Three-vessel coronary artery atherosclerosis. Trace pericardial effusion. Atherosclerotic plaque within the normal caliber aorta. Shared origin of the brachiocephalic and left common carotid arteries. Calcification of the proximal great vessels as well. Central pulmonary arteries are normal caliber. Luminal assessment precluded. No major venous abnormalities are seen. Mediastinum/Nodes: No mediastinal fluid or gas. Normal thyroid gland and thoracic inlet. No acute abnormality of the trachea or esophagus. No worrisome mediastinal or axillary adenopathy. Hilar nodal  evaluation is limited in the absence of intravenous contrast media. Lungs/Pleura: Low-attenuation bilateral pleural effusions. Adjacent areas of passive atelectasis. Mild airways thickening. Some additional patchy opacities are present in the partially atelectatic left lower lobe, underlying aspiration or airspace disease not excluded. No pneumothorax. Background of centrilobular predominant emphysematous changes. Clustered micro nodules are seen in the anterior segment left upper lobe and lingula, possibly infectious or inflammatory. No concerning pulmonary nodules or masses. Musculoskeletal: Soft tissue swelling and contusive changes along the right lateral chest wall. There is a lobular hyperdense collection along the right chest wall concerning for an actively bleeding hematoma in the setting of this noncontrast CT. This measures up to 3.9 cm in maximal thickness extending 14.5 cm in AP diameter and 17.5 cm in craniocaudal extent. Majority of this collection  appears subjacent to the serve radius though there is loss of discernible fat plane suggesting a possible intramuscular source. Portions of the shoulders are collimated from view. There is questionable cortical step-off along the posterior aspect of the glenoid in the right shoulder seen on the first images of the sequences (2/1), and a glenoid injury is not fully excluded. Consider dedicated imaging. Additionally, sagittal reconstruction there is a mild cortical step-off along the superior aspect of the manubrium albeit without adjacent soft tissue swelling thickening. Possibly related to step artifact though a minimally displaced fracture is certainly possible. Correlate point tenderness. No visible displaced rib fractures. Several remote appearing deformities of the left anterolateral lower thoracic ribs, several seen on comparison from March 2021. Multilevel degenerative changes are present in the imaged portions of the spine. Multilevel flowing anterior  osteophytosis, compatible with features of diffuse idiopathic skeletal hyperostosis (DISH). Intraosseous hemangioma noted at T12. Additional degenerative changes in the shoulders. CT ABDOMEN PELVIS FINDINGS Hepatobiliary: Possible hypoattenuating lesion abutting the gallbladder fossa (2/66) measuring approximately 1.8 cm in size, incompletely characterized on this unenhanced CT with diminished imaging quality due to motion artifact. Hypertrophy of the left lobe liver with a nodular liver surface contour suggestive of cirrhotic changes. Normal liver attenuation. Normal gallbladder distention. No visible calcified gallstones. Stable surgical clips near the gallbladder fundus. Pericholecystic fluid may reflect redistributed ascites. No biliary ductal dilatation. Pancreas: Partial fatty replacement of the pancreas. No pancreatic ductal dilatation or surrounding inflammatory changes. Spleen: Splenomegaly. No concerning focal splenic lesions. Capsular calcification along the posterior spleen (2/64) stable from prior may reflect sequela of prior injury. Adrenals/Urinary Tract: Macroscopic fat containing lesion in the medial limb of the left adrenal gland suggestive of myelolipoma while a more intermediate to low-attenuation lesion in the body could reflect small adrenal adenoma. Overall stable appearance from prior accounting for motion artifact and differences in technique. No concerning right adrenal lesion. Redemonstration of a 4.5 cm fluid attenuation cyst in the upper pole left kidney, not significantly changed from prior. Stable mild bilateral perinephric stranding is nonspecific can be seen with advanced age or diminished renal function. No new concerning focal renal lesion. No urolithiasis or hydronephrosis. Urinary bladder is unremarkable for the degree of distention. Stomach/Bowel: Distal esophagus, stomach and duodenal sweep are unremarkable. No small bowel wall thickening or dilatation. No evidence of  obstruction. Appendix is not visualized. No focal inflammation the vicinity of the cecum to suggest an occult appendicitis. Cecum displaced to the midline pelvis without frank volvulus. Moderate colonic stool burden. No colonic dilatation or wall thickening. Scattered colonic diverticula without focal inflammation to suggest diverticulitis. Vascular/Lymphatic: No worrisome abdominopelvic adenopathy. Multiple surgical clips in the retroperitoneum likely reflecting prior nodal dissection. Extensive atherosclerotic calcification is similar to prior. Luminal evaluation severely limited in the absence of contrast media. Reproductive: Uterus is surgically absent. No concerning adnexal lesions. No concerning adnexal lesions are identified. Other: Extensive circumferential body wall edema. More focal soft tissue thickening of the right chest wall and flank may reflect some additional contusive change in the setting of recent fall. No traumatic abdominal wall dehiscence. No bowel containing hernias. Postsurgical changes from low vertical midline incision. Musculoskeletal: No acute fracture or traumatic osseous injury of the lumbar spine or bony pelvis. Proximal femora are intact and normally located. Unchanged grade 1 anterolisthesis L4 on 5. Multilevel degenerative changes are present in the imaged portions of the spine. Additional degenerative changes throughout the hips and pelvis. No worrisome osseous lesions. IMPRESSION: Traumatic Findings: 1. Contusive  changes along the right posterolateral chest wall and flank and lateral right breast soft tissues. Larger heterogeneous, hyperdense chest wall hematoma which appears subjacent to or possibly arising from the serrated is musculature. Concerning for acute hemorrhage in the setting of declining hemoglobin. No visible displaced rib fractures though bony demineralization may detection of subtle nondisplaced fracture. 2. Possible fracture along the posterior right glenoid,  incompletely imaged on this exam. Consider dedicated right shoulder imaging. 3. Additional questionable cortical step-off of the superior manubrium albeit without adjacent soft tissue thickening or stranding. Correlate for point tenderness to assess for nondisplaced fracture. 4. Unenhanced CT was performed per clinician order. Lack of IV contrast limits sensitivity and specificity, especially for evaluation of abdominal/pelvic solid viscera. 5. Ill-defined questionable hypoattenuating 1.8 cm lesion adjacent the gallbladder fossa. A rounded configuration is less suggestive of a acute traumatic liver lesion though not fully excluded. Regardless, at minimum further assessment with outpatient MRI should be considered given underlying intrinsic liver disease/cirrhosis. Nontraumatic Findings: 1. Features of volume overload/anasarca with diffuse body wall edema, bilateral pleural effusions, ascites. Possibly related to a chronic history of CHF or underlying cirrhosis. 2. Passive atelectatic changes adjacent the pleural effusions with some more patchy opacity in the left lung base and micro nodularity in the left upper lobe and lingula which could reflect a superimposed infectious or inflammatory process. 3. Stigmata of cirrhosis include a nodular liver, left lobe hypertrophy, and splenomegaly. 4. 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 5. Mild bilateral perinephric stranding, can reflect diminished renal function or senescent change though could correlate for urinary symptoms and consider urinalysis as appropriate. 6. Postsurgical changes from prior retroperitoneal nodal dissection. 7. Prior hysterectomy. 8. Cardiomegaly, three-vessel coronary artery atherosclerosis. 9.  Aortic Atherosclerosis (ICD10-I70.0). 10.  Emphysema (ICD10-J43.9). These results were called by telephone at the time of interpretation on 10/14/2020 at 5:44  pm to provider [facility] , who verbally acknowledged these results. Electronically Signed   By: Lovena Le M.D.   On: 10/14/2020 17:47      Flora Lipps, MD  [facility] 10/15/2020

## 2020-10-15 NOTE — Plan of Care (Signed)
  Problem: Education: Goal: Knowledge of General Education information will improve Description Including pain rating scale, medication(s)/side effects and non-pharmacologic comfort measures Outcome: Progressing   Problem: Health Behavior/Discharge Planning: Goal: Ability to manage health-related needs will improve Outcome: Progressing   

## 2020-10-15 NOTE — ED Notes (Signed)
Patient called out for assistance. Helped patient ambulate to toilet for BM. Removed purewick per pt request.

## 2020-10-16 DIAGNOSIS — D62 Acute posthemorrhagic anemia: Secondary | ICD-10-CM | POA: Diagnosis not present

## 2020-10-16 LAB — TYPE AND SCREEN
ABO/RH(D): O POS
Antibody Screen: NEGATIVE
Unit division: 0
Unit division: 0

## 2020-10-16 LAB — DAT, POLYSPECIFIC AHG (ARMC ONLY): Polyspecific AHG test: NEGATIVE

## 2020-10-16 LAB — BPAM RBC
Blood Product Expiration Date: 202112142359
Blood Product Expiration Date: 202112282359
ISSUE DATE / TIME: 202112011830
ISSUE DATE / TIME: 202112021544
Unit Type and Rh: 5100
Unit Type and Rh: 9500

## 2020-10-16 LAB — PHOSPHORUS: Phosphorus: 4 mg/dL (ref 2.5–4.6)

## 2020-10-16 LAB — BASIC METABOLIC PANEL
Anion gap: 11 (ref 5–15)
BUN: 34 mg/dL — ABNORMAL HIGH (ref 8–23)
CO2: 27 mmol/L (ref 22–32)
Calcium: 9.4 mg/dL (ref 8.9–10.3)
Chloride: 101 mmol/L (ref 98–111)
Creatinine, Ser: 1.5 mg/dL — ABNORMAL HIGH (ref 0.44–1.00)
GFR, Estimated: 36 mL/min — ABNORMAL LOW (ref >60)
Glucose, Bld: 121 mg/dL — ABNORMAL HIGH (ref 70–99)
Potassium: 4.7 mmol/L (ref 3.5–5.1)
Sodium: 139 mmol/L (ref 135–145)

## 2020-10-16 LAB — HEMOGLOBIN AND HEMATOCRIT, BLOOD
HCT: 27.2 % — ABNORMAL LOW (ref 36.0–46.0)
HCT: 27.4 % — ABNORMAL LOW (ref 36.0–46.0)
Hemoglobin: 8.5 g/dL — ABNORMAL LOW (ref 12.0–15.0)
Hemoglobin: 8.5 g/dL — ABNORMAL LOW (ref 12.0–15.0)

## 2020-10-16 LAB — CBC
HCT: 28.1 % — ABNORMAL LOW (ref 36.0–46.0)
Hemoglobin: 8.9 g/dL — ABNORMAL LOW (ref 12.0–15.0)
MCH: 29.4 pg (ref 26.0–34.0)
MCHC: 31.7 g/dL (ref 30.0–36.0)
MCV: 92.7 fL (ref 80.0–100.0)
Platelets: 358 10*3/uL (ref 150–400)
RBC: 3.03 MIL/uL — ABNORMAL LOW (ref 3.87–5.11)
RDW: 19.4 % — ABNORMAL HIGH (ref 11.5–15.5)
WBC: 11.1 10*3/uL — ABNORMAL HIGH (ref 4.0–10.5)
nRBC: 0 % (ref 0.0–0.2)

## 2020-10-16 LAB — GLUCOSE, CAPILLARY
Glucose-Capillary: 101 mg/dL — ABNORMAL HIGH (ref 70–99)
Glucose-Capillary: 119 mg/dL — ABNORMAL HIGH (ref 70–99)
Glucose-Capillary: 104 mg/dL — ABNORMAL HIGH (ref 70–99)
Glucose-Capillary: 115 mg/dL — ABNORMAL HIGH (ref 70–99)

## 2020-10-16 LAB — MAGNESIUM: Magnesium: 1.9 mg/dL (ref 1.7–2.4)

## 2020-10-16 LAB — HAPTOGLOBIN: Haptoglobin: 150 mg/dL (ref 42–346)

## 2020-10-16 MED ORDER — PNEUMOCOCCAL VAC POLYVALENT 25 MCG/0.5ML IJ INJ
0.5000 mL | INJECTION | Freq: Once | INTRAMUSCULAR | Status: AC
  Administered 2020-10-19: 0.5 mL via INTRAMUSCULAR
  Filled 2020-10-16: qty 0.5

## 2020-10-16 MED ORDER — MUPIROCIN 2 % EX OINT
TOPICAL_OINTMENT | Freq: Every day | CUTANEOUS | Status: DC
  Administered 2020-10-19: 1 via TOPICAL
  Filled 2020-10-16: qty 22

## 2020-10-16 NOTE — Progress Notes (Addendum)
PROGRESS NOTE    Angelica Juarez  ZOX:[id number]  DOB: 1941-05-23  DOA: 10/14/2020 PCP: Sofie Hartigan, MD Outpatient Specialists:   Hospital course:  79 year old female with PCV, A. fib on anticoagulation, HFpEF, HTN, COPD, CKD 3 was admitted 10/14/2020 with symptomatic anemia.  Hemoglobin was noted to be 6.9 down from baseline of 10.  Blood pressure was low normal but she was tachycardic.  Work-up revealed massive right-sided bruising.  CT revealed no retroperitoneal hematoma but she did have a chest wall hematoma.  It also revealed a nodular liver consistent with cirrhosis.   Subjective:  Patient states she does not know how she is doing, is not really sure.  Admits to feeling tired.  Does not really feel any better but does not really feel any worse than she had in the past week.   Objective: Vitals:   10/15/20 2353 10/16/20 0414 10/16/20 0830 10/16/20 1134  BP: 112/71 (!) 95/57 (!) 144/57 138/68  Pulse: 95 96 84 96  Resp: 18 18 16 16   Temp: 97.8 F (36.6 C) 97.8 F (36.6 C) 98 F (36.7 C) 98.1 F (36.7 C)  TempSrc: Oral Oral Oral   SpO2: 91% 95% 94%   Weight:      Height:        Intake/Output Summary (Last 24 hours) at 10/16/2020 1525 Last data filed at 10/16/2020 1411 Gross per 24 hour  Intake 808 ml  Output --  Net 808 ml   Filed Weights   10/14/20 1321  Weight: 104.6 kg     Exam:  General: Chronically ill tired appearing female half sitting on the bed in no acute distress but looking unwell Eyes: sclera anicteric, conjuctiva mild injection bilaterally CVS: S1-S2, irregular  Respiratory: Patient has significant bruising along her entire right side of chest down to her abdomen including her breasts.  Decreased air entry bilaterally secondary to decreased inspiratory effort, rales at bases  GI: NABS, soft, NT bruising is noted above LE: 2+ edema bilaterally.  She has some erythema on her left lower forefoot extending up to her ankle surrounding a shallow  ulcer. Neuro: A/O x 3, Moving all extremities equally with normal strength, CN 3-12 intact, grossly nonfocal.  Psych: patient is logical and coherent, judgement and insight appear normal, mood and affect appropriate to situation.   Assessment & Plan:   Acute blood loss anemia secondary to large chest wall hematoma and known microscopic colitis Patient with 18 cm x 15 cm right chest wall hematoma thought to be bleeding as seen on CT as likely cause. However patient also has microscopic colitis seen on colonoscopy July 2021 may have had dark stool. Eliquis and aspirin were held.   Received 2 units irradiated PRBC and hemoglobin this morning was 8.9.  Patient was seen by hematology who recommended keeping hemoglobin greater than 8. Will need to follow hemoglobin every 8 hours to see if patient is still bleeding. Stool guaiacs are pending. If H&H continues to drop, will need to repeat CT to see if hematoma is expanding and/or consider GI consultation for repeat colonoscopy.  Cirrhosis Patient noted to have a nodular liver suggestive of cirrhosis Previous work-up including hepatitis B surface antigen and hepatitis C are negative. Prior edema had been thought to be secondary to HFpEF although as noted below she has normal EF normal diastolic parameters. Patient states she has an outpatient gastroenterologist Dr Tedra Coupe and will follow up with her for further w/u.   Volume overload Diffuse body wall  edema, bilateral pleural effusions, ascites Most likely secondary to cirrhosis Will need to be diuresed carefully  Incidentally noted 1.8 cm lesion at gallbladder fossa Will need an outpatient MRI for follow-up as recommended  Patchy opacity in left lung base Possibly suggestive of infection or inflammatory Patient is afebrile with no new cough WBC is 11 and improved from prior admission Will follow with low threshold for antibiotics should she develop anything suggestive of clinical  pneumonia.  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  Possible fracture of glenoid and manubrium Patient able to move her shoulder and arm with minimal tenderness No step-off noted on manubrium  Polycythemia vera Continue hydroxyurea Appreciate heme-onc consult  Patient has a diagnosis of HFpEF However echo done 06/12/2020 showed EF of 50 to 55% with normal diastolic parameters  Pulmonary hypertension chronic hypoxic respiratory failure Patient was noted to have a PA pressure of 73 mmHg with moderately dilated RA. This can be followed up as an outpatient with cardiology Continue home O2 2 L [place]  Atrial fibrillation Eliquis is being held due to hematoma and anemia Patient is not on any rate control medications at home  COPD No evidence for acute flare To new inhaled bronchodilators, Anoro Ellipta and Singulair  Redness of left ankle Patient is s/p treatment for cellulitis which grew out Enterobacter and Klebsiella from the wound Both patient and husband note the ankle is better. Skin biopsy to rule out pemphigus/pemphigoid is negative, perivascular eosinophilic infiltration consistent with allergic reaction, possible drug reaction.   Seizure disorder Continue Keppra 500 twice daily   DVT prophylaxis: SCD Code Status: Full Family Communication: Spoke with patient and husband at bedside Disposition Plan:   Patient is from: Home  Anticipated Discharge Location: Home  Barriers to Discharge: Possible ongoing bleeding and acute anemia  Is patient medically stable for Discharge: No   Consultants:  Hematology oncology  Procedures:  None  Antimicrobials:  None   Data Reviewed:  Basic Metabolic Panel: Recent Labs  Lab 10/14/20 0932 10/14/20 1330 10/15/20 0420 10/16/20 0337  NA 135 138 139 139  K 5.3* 5.6* 4.7 4.7  CL 99 103 105 101  CO2 27 27 25 27   GLUCOSE 115* 99 104* 121*  BUN  33* 32* 30* 34*  CREATININE 1.88* 1.82* 1.52* 1.50*  CALCIUM 9.1 9.5 8.7* 9.4  MG  --   --   --  1.9  PHOS  --   --   --  4.0   Liver Function Tests: Recent Labs  Lab 10/14/20 0932 10/14/20 1330  AST 24 25  ALT 17 18  ALKPHOS 63 62  BILITOT 0.9 1.1  PROT 5.8* 6.1*  ALBUMIN 3.0* 3.2*   No results for input(s): LIPASE, AMYLASE in the last 168 hours. No results for input(s): AMMONIA in the last 168 hours. CBC: Recent Labs  Lab 10/14/20 0932 10/14/20 1330 10/15/20 0420 10/16/20 0337  WBC 13.8* 13.5* 11.1* 11.1*  NEUTROABS 11.8* 11.6*  --   --   HGB 6.9* 7.1* 7.2* 8.9*  HCT 22.3* 23.0* 23.1* 28.1*  MCV 93.3 93.9 94.3 92.7  PLT 367 384 305 358   Cardiac Enzymes: No results for input(s): CKTOTAL, CKMB, CKMBINDEX, TROPONINI in the last 168 hours. BNP (last 3 results) No results for input(s): PROBNP in the last 8760 hours. CBG: Recent Labs  Lab 10/15/20 1755 10/15/20 1835 10/15/20 2058 10/16/20 0746 10/16/20 1132  GLUCAP 117* 99 95 101* 119*  Recent Results (from the past 240 hour(s))  Resp Panel by RT-PCR (Flu A&B, Covid) Nasopharyngeal Swab     Status: None   Collection Time: 10/14/20  8:32 PM   Specimen: Nasopharyngeal Swab; Nasopharyngeal(NP) swabs in vial transport medium  Result Value Ref Range Status   SARS Coronavirus 2 by RT PCR NEGATIVE NEGATIVE Final    Comment: (NOTE) SARS-CoV-2 target nucleic acids are NOT DETECTED.  The SARS-CoV-2 RNA is generally detectable in upper respiratory specimens during the acute phase of infection. The lowest concentration of SARS-CoV-2 viral copies this assay can detect is 138 copies/mL. A negative result does not preclude SARS-Cov-2 infection and should not be used as the sole basis for treatment or other patient management decisions. A negative result may occur with  improper specimen collection/handling, submission of specimen other than nasopharyngeal swab, presence of viral mutation(s) within the areas targeted  by this assay, and inadequate number of viral copies(<138 copies/mL). A negative result must be combined with clinical observations, patient history, and epidemiological information. The expected result is Negative.  Fact Sheet for Patients:  EntrepreneurPulse.com.au  Fact Sheet for Healthcare Providers:  IncredibleEmployment.be  This test is no t yet approved or cleared by the [place] FDA and  has been authorized for detection and/or diagnosis of SARS-CoV-2 by FDA under an Emergency Use Authorization (EUA). This EUA will remain  in effect (meaning this test can be used) for the duration of the COVID-19 declaration under Section 564(b)(1) of the Act, 21 U.S.C.section 360bbb-3(b)(1), unless the authorization is terminated  or revoked sooner.       Influenza A by PCR NEGATIVE NEGATIVE Final   Influenza B by PCR NEGATIVE NEGATIVE Final    Comment: (NOTE) The Xpert Xpress SARS-CoV-2/FLU/RSV plus assay is intended as an aid in the diagnosis of influenza from Nasopharyngeal swab specimens and should not be used as a sole basis for treatment. Nasal washings and aspirates are unacceptable for Xpert Xpress SARS-CoV-2/FLU/RSV testing.  Fact Sheet for Patients: EntrepreneurPulse.com.au  Fact Sheet for Healthcare Providers: IncredibleEmployment.be  This test is not yet approved or cleared by the [place] FDA and has been authorized for detection and/or diagnosis of SARS-CoV-2 by FDA under an Emergency Use Authorization (EUA). This EUA will remain in effect (meaning this test can be used) for the duration of the COVID-19 declaration under Section 564(b)(1) of the Act, 21 U.S.C. section 360bbb-3(b)(1), unless the authorization is terminated or revoked.  Performed at [facility], [address] [place], [place] [address]       Studies: DG Shoulder Right  Result Date: 10/14/2020 CLINICAL  DATA:  [place], bruising, glenoid abnormality on CT EXAM: RIGHT SHOULDER - 2+ VIEW COMPARISON:  10/14/2020 FINDINGS: Internal rotation, external rotation, and transscapular views of the right shoulder are obtained. There are no acute displaced fractures. Severe right shoulder osteoarthritis is seen with glenohumeral and acromioclavicular joint hypertrophic changes. Veiling opacity at the right lung base consistent with known pleural effusion. IMPRESSION: 1. No acute displaced fracture. 2. Severe right shoulder osteoarthritis. 3. Right pleural effusion. Electronically Signed   By: Randa Ngo M.D.   On: 10/14/2020 18:55   CT CHEST ABDOMEN PELVIS WO CONTRAST  Result Date: 10/14/2020 CLINICAL DATA:  Fall, low hemoglobin and hematocrit, bruising in the right upper back and right shoulder pain. History of fallopian tube malignancy noted during hysterectomy. History of CHF, COPD, CKD EXAM: CT CHEST, ABDOMEN AND PELVIS WITHOUT CONTRAST TECHNIQUE: Multidetector CT imaging of the chest, abdomen and pelvis was  performed following the standard protocol without IV contrast. COMPARISON:  CT 01/16/2020, 11/16/2012, renal ultrasound 10/04/2020 FINDINGS: CT CHEST FINDINGS Cardiovascular: Limited assessment of the vasculature in the absence of contrast media. Cardiomegaly with predominantly right atrial enlargement. Hypoattenuation of cardiac blood pool compatible with anemia. Three-vessel coronary artery atherosclerosis. Trace pericardial effusion. Atherosclerotic plaque within the normal caliber aorta. Shared origin of the brachiocephalic and left common carotid arteries. Calcification of the proximal great vessels as well. Central pulmonary arteries are normal caliber. Luminal assessment precluded. No major venous abnormalities are seen. Mediastinum/Nodes: No mediastinal fluid or gas. Normal thyroid gland and thoracic inlet. No acute abnormality of the trachea or esophagus. No worrisome mediastinal or axillary adenopathy.  Hilar nodal evaluation is limited in the absence of intravenous contrast media. Lungs/Pleura: Low-attenuation bilateral pleural effusions. Adjacent areas of passive atelectasis. Mild airways thickening. Some additional patchy opacities are present in the partially atelectatic left lower lobe, underlying aspiration or airspace disease not excluded. No pneumothorax. Background of centrilobular predominant emphysematous changes. Clustered micro nodules are seen in the anterior segment left upper lobe and lingula, possibly infectious or inflammatory. No concerning pulmonary nodules or masses. Musculoskeletal: Soft tissue swelling and contusive changes along the right lateral chest wall. There is a lobular hyperdense collection along the right chest wall concerning for an actively bleeding hematoma in the setting of this noncontrast CT. This measures up to 3.9 cm in maximal thickness extending 14.5 cm in AP diameter and 17.5 cm in craniocaudal extent. Majority of this collection appears subjacent to the serve radius though there is loss of discernible fat plane suggesting a possible intramuscular source. Portions of the shoulders are collimated from view. There is questionable cortical step-off along the posterior aspect of the glenoid in the right shoulder seen on the first images of the sequences (2/1), and a glenoid injury is not fully excluded. Consider dedicated imaging. Additionally, sagittal reconstruction there is a mild cortical step-off along the superior aspect of the manubrium albeit without adjacent soft tissue swelling thickening. Possibly related to step artifact though a minimally displaced fracture is certainly possible. Correlate point tenderness. No visible displaced rib fractures. Several remote appearing deformities of the left anterolateral lower thoracic ribs, several seen on comparison from March 2021. Multilevel degenerative changes are present in the imaged portions of the spine. Multilevel  flowing anterior osteophytosis, compatible with features of diffuse idiopathic skeletal hyperostosis (DISH). Intraosseous hemangioma noted at T12. Additional degenerative changes in the shoulders. CT ABDOMEN PELVIS FINDINGS Hepatobiliary: Possible hypoattenuating lesion abutting the gallbladder fossa (2/66) measuring approximately 1.8 cm in size, incompletely characterized on this unenhanced CT with diminished imaging quality due to motion artifact. Hypertrophy of the left lobe liver with a nodular liver surface contour suggestive of cirrhotic changes. Normal liver attenuation. Normal gallbladder distention. No visible calcified gallstones. Stable surgical clips near the gallbladder fundus. Pericholecystic fluid may reflect redistributed ascites. No biliary ductal dilatation. Pancreas: Partial fatty replacement of the pancreas. No pancreatic ductal dilatation or surrounding inflammatory changes. Spleen: Splenomegaly. No concerning focal splenic lesions. Capsular calcification along the posterior spleen (2/64) stable from prior may reflect sequela of prior injury. Adrenals/Urinary Tract: Macroscopic fat containing lesion in the medial limb of the left adrenal gland suggestive of myelolipoma while a more intermediate to low-attenuation lesion in the body could reflect small adrenal adenoma. Overall stable appearance from prior accounting for motion artifact and differences in technique. No concerning right adrenal lesion. Redemonstration of a 4.5 cm fluid attenuation cyst in the upper pole left kidney,  not significantly changed from prior. Stable mild bilateral perinephric stranding is nonspecific can be seen with advanced age or diminished renal function. No new concerning focal renal lesion. No urolithiasis or hydronephrosis. Urinary bladder is unremarkable for the degree of distention. Stomach/Bowel: Distal esophagus, stomach and duodenal sweep are unremarkable. No small bowel wall thickening or dilatation. No  evidence of obstruction. Appendix is not visualized. No focal inflammation the vicinity of the cecum to suggest an occult appendicitis. Cecum displaced to the midline pelvis without frank volvulus. Moderate colonic stool burden. No colonic dilatation or wall thickening. Scattered colonic diverticula without focal inflammation to suggest diverticulitis. Vascular/Lymphatic: No worrisome abdominopelvic adenopathy. Multiple surgical clips in the retroperitoneum likely reflecting prior nodal dissection. Extensive atherosclerotic calcification is similar to prior. Luminal evaluation severely limited in the absence of contrast media. Reproductive: Uterus is surgically absent. No concerning adnexal lesions. No concerning adnexal lesions are identified. Other: Extensive circumferential body wall edema. More focal soft tissue thickening of the right chest wall and flank may reflect some additional contusive change in the setting of recent fall. No traumatic abdominal wall dehiscence. No bowel containing hernias. Postsurgical changes from low vertical midline incision. Musculoskeletal: No acute fracture or traumatic osseous injury of the lumbar spine or bony pelvis. Proximal femora are intact and normally located. Unchanged grade 1 anterolisthesis L4 on 5. Multilevel degenerative changes are present in the imaged portions of the spine. Additional degenerative changes throughout the hips and pelvis. No worrisome osseous lesions. IMPRESSION: Traumatic Findings: 1. Contusive changes along the right posterolateral chest wall and flank and lateral right breast soft tissues. Larger heterogeneous, hyperdense chest wall hematoma which appears subjacent to or possibly arising from the serrated is musculature. Concerning for acute hemorrhage in the setting of declining hemoglobin. No visible displaced rib fractures though bony demineralization may detection of subtle nondisplaced fracture. 2. Possible fracture along the posterior right  glenoid, incompletely imaged on this exam. Consider dedicated right shoulder imaging. 3. Additional questionable cortical step-off of the superior manubrium albeit without adjacent soft tissue thickening or stranding. Correlate for point tenderness to assess for nondisplaced fracture. 4. Unenhanced CT was performed per clinician order. Lack of IV contrast limits sensitivity and specificity, especially for evaluation of abdominal/pelvic solid viscera. 5. Ill-defined questionable hypoattenuating 1.8 cm lesion adjacent the gallbladder fossa. A rounded configuration is less suggestive of a acute traumatic liver lesion though not fully excluded. Regardless, at minimum further assessment with outpatient MRI should be considered given underlying intrinsic liver disease/cirrhosis. Nontraumatic Findings: 1. Features of volume overload/anasarca with diffuse body wall edema, bilateral pleural effusions, ascites. Possibly related to a chronic history of CHF or underlying cirrhosis. 2. Passive atelectatic changes adjacent the pleural effusions with some more patchy opacity in the left lung base and micro nodularity in the left upper lobe and lingula which could reflect a superimposed infectious or inflammatory process. 3. Stigmata of cirrhosis include a nodular liver, left lobe hypertrophy, and splenomegaly. 4. 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 5. Mild bilateral perinephric stranding, can reflect diminished renal function or senescent change though could correlate for urinary symptoms and consider urinalysis as appropriate. 6. Postsurgical changes from prior retroperitoneal nodal dissection. 7. Prior hysterectomy. 8. Cardiomegaly, three-vessel coronary artery atherosclerosis. 9.  Aortic Atherosclerosis (ICD10-I70.0). 10.  Emphysema (ICD10-J43.9). These results were called by telephone at the time of interpretation on  10/14/2020 at 5:44 pm to provider [facility] , who verbally acknowledged  these results. Electronically Signed   By: Lovena Le M.D.   On: 10/14/2020 17:47     Scheduled Meds: . atorvastatin  40 mg Oral Daily  . diphenhydrAMINE  25 mg Oral Daily  . fluticasone  2 spray Each Nare Daily  . furosemide  40 mg Oral Daily  . [START ON 10/19/2020] hydroxyurea  500 mg Oral 2 times per day on Mon   And  . hydroxyurea  500 mg Oral Once per day on Sun Tue Wed Thu Fri Sat  . insulin aspart  0-15 Units Subcutaneous TID WC  . levETIRAcetam  500 mg Oral BID  . montelukast  10 mg Oral Daily  . multivitamin with minerals  1 tablet Oral Daily  . mupirocin ointment   Topical Daily  . pneumococcal 23 valent vaccine  0.5 mL Intramuscular Tomorrow-1000  . sodium chloride flush  3 mL Intravenous Q12H  . traZODone  100 mg Oral QHS  . umeclidinium-vilanterol  1 puff Inhalation Daily   Continuous Infusions: . sodium chloride      Principal Problem:   Acute blood loss anemia Active Problems:   COPD (chronic obstructive pulmonary disease) (HCC)   Chronic kidney disease, stage 3a   Hypertension   OSA on CPAP   Seizure disorder (HCC)   Polycythemia rubra vera ([place])   Other persistent atrial fibrillation (HCC)   Chronic kidney disease   Hyperkalemia   Chest wall hematoma, right, subsequent encounter   Chronic respiratory failure ([place])   Symptomatic anemia     Kyla Duffy Tublu Keone Kamer, [facility]  If 7PM-7AM, please contact night-coverage [email or web address] Password [email or web address] 10/16/2020, 3:25 PM    LOS: 1 day

## 2020-10-16 NOTE — Plan of Care (Signed)

## 2020-10-16 NOTE — NC FL2 (Signed)
[place] LEVEL OF CARE SCREENING TOOL     IDENTIFICATION  Patient Name: Angelica Juarez Birthdate: 06/24/1941 Sex: female Admission Date (Current Location): 10/14/2020  [place] and [place] Number:  Engineering geologist and Address:  [facility], [address], [place], [place] [address]      Provider Number: [id number]  Attending Physician Name and Address:  Oren Binet*  Relative Name and Phone Number:       Current Level of Care: Hospital Recommended Level of Care: [place] Prior Approval Number:    Date Approved/Denied:   PASRR Number: [id number]  Discharge Plan: SNF    Current Diagnoses: Patient Active Problem List   Diagnosis Date Noted  . Symptomatic anemia 10/15/2020  . Macrocytic anemia 10/14/2020  . Acute blood loss anemia 10/14/2020  . Chronic respiratory failure ([place]) 10/14/2020  . Chronic kidney disease   . Hyperkalemia   . Chest wall hematoma, right, subsequent encounter   . Acute on chronic diastolic CHF (congestive heart failure) ([place]) 09/29/2020  . Cellulitis of left leg 09/28/2020  . Rash and nonspecific skin eruption 09/28/2020  . Left leg cellulitis 09/28/2020  . Normocytic anemia 06/11/2020  . COPD with acute exacerbation ([place]) 06/11/2020  . Chronic diarrhea of unknown origin   . Acute left flank pain 01/15/2020  . Hypokalemia 11/14/2019  . Proteinuria 08/09/2019  . Acute on chronic congestive heart failure ([place]) 07/09/2019  . Nonrheumatic mitral valve regurgitation 07/08/2019  . Other persistent atrial fibrillation ([place]) 07/08/2019  . C. difficile diarrhea 11/24/2018  . Diarrhea 11/18/2018  . Nausea without vomiting 11/18/2018  . Goals of care, counseling/discussion 09/12/2018  . 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 History of repair of right rotator cuff 08/25/2016  . Acute respiratory failure with hypoxia ([place]) 08/06/2016  . Nontraumatic tear of right rotator  cuff 04/21/2016  . Atypical chest pain 04/07/2016  . Impingement syndrome of right shoulder 12/29/2015  . Primary osteoarthritis of first carpometacarpal joint of left hand 10/27/2015  . Right shoulder pain 10/27/2015  . OSA on CPAP 10/14/2015  . COPD (chronic obstructive pulmonary disease) ([place]) 07/13/2015  . Type 2 diabetes mellitus with microalbuminuria, without long-term current use of insulin ([place]) 07/13/2015  . Hypertension 03/05/2015  . Difficulty sleeping 11/26/2014  . Chronic kidney disease, stage 3a 07/31/2014  . Seizure disorder ([place]) 07/31/2014  . History of colonic polyps 05/21/2014  . Hyperlipidemia, unspecified 04/30/2014    Orientation RESPIRATION BLADDER Height & Weight     Self, Time, Situation, Place  O2 (Nasal Canula 2 L) Continent, External catheter Weight: 230 lb 9.6 oz (104.6 kg) Height:  5\' 7"  (170.2 cm)  BEHAVIORAL SYMPTOMS/MOOD NEUROLOGICAL BOWEL NUTRITION STATUS   (None)  (Seizure Disorder) Continent Diet (Heart healthy/carb modified)  AMBULATORY STATUS COMMUNICATION OF NEEDS Skin   Limited Assist Verbally Bruising, Other (Comment) (Non-pressure wound (cellulitis) on left lower leg: Ace wrap. Weeping on right posterior leg. Rash on left upper back.)                       Personal Care Assistance Level of Assistance              Functional Limitations Info  Sight, Hearing, Speech Sight Info: Adequate Hearing Info: Adequate Speech Info: Adequate    SPECIAL CARE FACTORS FREQUENCY  PT (By licensed PT)     PT Frequency: 5 x week  Contractures Contractures Info: Not present    Additional Factors Info  Code Status, Allergies Code Status Info: Full code Allergies Info: Iodinated Diagnostic Agents, Latex, Phenobarbital, Tape           Current Medications (10/16/2020):  This is the current hospital active medication list Current Facility-Administered Medications  Medication Dose Route Frequency Provider Last Rate Last  Admin  . 0.9 %  sodium chloride infusion  250 mL Intravenous PRN Agbata, Tochukwu, MD      . acetaminophen (TYLENOL) tablet 650 mg  650 mg Oral Q6H PRN Agbata, Tochukwu, MD   650 mg at 10/16/20 0845   Or  . acetaminophen (TYLENOL) suppository 650 mg  650 mg Rectal Q6H PRN Agbata, Tochukwu, MD      . albuterol (PROVENTIL) (2.5 MG/3ML) 0.083% nebulizer solution 1.25 mg  1.25 mg Nebulization Q6H PRN Agbata, Tochukwu, MD      . atorvastatin (LIPITOR) tablet 40 mg  40 mg Oral Daily Agbata, Tochukwu, MD   40 mg at 10/16/20 0948  . diphenhydrAMINE (BENADRYL) capsule 25 mg  25 mg Oral Daily Agbata, Tochukwu, MD   25 mg at 10/16/20 0948  . fluticasone (FLONASE) 50 MCG/ACT nasal spray 2 spray  2 spray Each Nare Daily Agbata, Tochukwu, MD      . furosemide (LASIX) tablet 40 mg  40 mg Oral Daily Agbata, Tochukwu, MD   40 mg at 10/16/20 0948  . [START ON 10/19/2020] hydroxyurea (HYDREA) capsule 500 mg  500 mg Oral 2 times per day on Mon Belue, Nathan S, RPH       And  . hydroxyurea (HYDREA) capsule 500 mg  500 mg Oral Once per day on Sun Tue Wed Thu Fri Sat Renda Rolls, RPH      . insulin aspart (novoLOG) injection 0-15 Units  0-15 Units Subcutaneous TID WC Agbata, Tochukwu, MD      . levETIRAcetam (KEPPRA) tablet 500 mg  500 mg Oral BID Agbata, Tochukwu, MD   500 mg at 10/16/20 0948  . montelukast (SINGULAIR) tablet 10 mg  10 mg Oral Daily Agbata, Tochukwu, MD   10 mg at 10/16/20 0948  . multivitamin with minerals tablet 1 tablet  1 tablet Oral Daily Agbata, Tochukwu, MD   1 tablet at 10/16/20 0949  . ondansetron (ZOFRAN) tablet 4 mg  4 mg Oral Q6H PRN Agbata, Tochukwu, MD       Or  . ondansetron (ZOFRAN) injection 4 mg  4 mg Intravenous Q6H PRN Agbata, Tochukwu, MD      . pneumococcal 23 valent vaccine (PNEUMOVAX-23) injection 0.5 mL  0.5 mL Intramuscular Tomorrow-1000 Pokhrel, Laxman, MD      . sodium chloride flush (NS) 0.9 % injection 3 mL  3 mL Intravenous Q12H Agbata, Tochukwu, MD   3 mL at  10/16/20 0950  . sodium chloride flush (NS) 0.9 % injection 3 mL  3 mL Intravenous PRN Agbata, Tochukwu, MD      . traZODone (DESYREL) tablet 100 mg  100 mg Oral QHS Agbata, Tochukwu, MD   100 mg at 10/15/20 2054  . umeclidinium-vilanterol (ANORO ELLIPTA) 62.5-25 MCG/INH 1 puff  1 puff Inhalation Daily Agbata, Tochukwu, MD         Discharge Medications: Please see discharge summary for a list of discharge medications.  Relevant Imaging Results:  Relevant Lab Results:   Additional Information SS#: [id number]  Candie Chroman, LCSW

## 2020-10-16 NOTE — Consult Note (Signed)
[facility]  Date of admission:  10/14/2020  Inpatient day:  10/15/2020  Consulting physician: Dr Flora Lipps  Reason for Consultation:  Patient with chest wall bruise, history of polycythemia   Chief Complaint: Angelica Juarez is a 79 y.o. 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  HPI:  The patient was diagnosed with polycythemia rubra vera in 07/2018.  She was initially on a phlebotomy program to maintain a hematocrit < 42.  She has been on a stable dose of hydroxyurea at 1000 mg on Mondays and 500 mg Tuesdays-Sundays.   She was admitted to [facility] from 09/28/2020 - 10/06/2020 with left leg swelling and blisters. Wound culture showed enterobacter and klebsiella. Bilateral lower extremity duplex was negative. She was treated with IV antibiotics and was switched to Bactrim for 5 days after discharge.  09/28/2020: Hematocrit 36.2, hemoglobin 11.3, MCV 90.5, platelets 545,000, WBC 13,700. 10/06/2020: Hematocrit 31.2, hemoglobin   9.7, MCV 90.2, platelets 509,000, WBC   9,800.  She was seen in the hematology clinic on 10/14/2020 for follow-up.  Clinically, she felt fatigued.  She noted a fall, but was unclear of the details.  Exam revealed marked deep ecchymosis involving her entire right ride.  She was on Eliquis.  Hematocrit was 22.3 and hemoglobin 6.9.  Concern was raised for a possible retroperitoneal bleed.  She was transported to the ER.  Chest, abdomen, and pelvis CT on 10/14/220 revealed contusive changes along the right posterolateral chest wall, flank and lateral right breast soft tissues.  There was a larger heterogeneous, hyperdense chest wall hematoma which appeard subjacent to or possibly arising from the serrated is musculature, concerning for acute hemorrhage.  There were no visible displaced rib fractures.  There was an iIll-defined questionable hypoattenuating 1.8 cm lesion adjacent the gallbladder fossa  (outpatient MRI should be considered given underlying intrinsic liver disease/cirrhosis).  There were features of volume overload/anasarca with diffuse body wall edema, bilateral pleural effusions, ascites, possibly related to a chronic history of CHF or underlying cirrhosis.  There was passive atelectatic changes adjacent the pleural effusions with some more patchy opacity in the left lung base and micro nodularity in the LUL and lingula which could reflect a superimposed infectious or inflammatory process.  There was stigmata of cirrhosis include a nodular liver, left lobe hypertrophy, and splenomegaly.  Right shoulder films on 10/14/2020 revealed no acute displaced fractures.  Serial hemogloblins have been 7.1 and 7.2.  She is currently receiving a transfusion of PRBCs.  Symptomatically, she notes fatigue.  She describes black stools.  She denies any melena, hematochezia or hematuria.  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  Colonoscopy on 06/01/2020 revealed erythematous mucosa in the descending, transverse, and ascending colon and diverticulosis in the sigmoid colon. There were non-bleeding external hemorrhoids. Four polyps were removed (tubular adenomas). Pathology revealed diffuse microscopic colitis.   Past Medical History:  Diagnosis Date  . Allergy    Seasonal  . Arthritis   . 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 Chronic kidney disease    chronic renal insufficiency  . Colon polyps 05/21/14  . COPD (chronic obstructive pulmonary disease) ([employer])   . Diabetes mellitus without complication ([place])   . Dyspnea   . Fracture closed, humerus, shaft    right   . History of kidney stones    40 years ago  . Hyperlipidemia 04/30/14  . Hypertension   . Impingement  syndrome of right shoulder 12/29/15  . Personal history of radiation therapy   . Pneumonia    hx  . Rotator cuff tear 04/21/16   right  . Seizures ([place])   . Sleep apnea     Past Surgical  History:  Procedure Laterality Date  . ABDOMINAL HYSTERECTOMY    . COLONOSCOPY WITH PROPOFOL N/A 06/01/2020   Procedure: COLONOSCOPY WITH PROPOFOL;  Surgeon: Lin Landsman, MD;  Location: [facility] ENDOSCOPY;  Service: Gastroenterology;  Laterality: N/A;  . ESOPHAGOGASTRODUODENOSCOPY (EGD) WITH PROPOFOL N/A 06/01/2020   Procedure: ESOPHAGOGASTRODUODENOSCOPY (EGD) WITH PROPOFOL;  Surgeon: Lin Landsman, MD;  Location: [facility] ENDOSCOPY;  Service: Gastroenterology;  Laterality: N/A;  . 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 EYE SURGERY     bilateral cataracts  . JOINT REPLACEMENT     bilateral knee replacement  . NASAL SEPTUM SURGERY    . OOPHORECTOMY    . REPLACEMENT TOTAL KNEE Bilateral 2004  . SHOULDER ARTHROSCOPY WITH BICEPSTENOTOMY Right 05/25/2016   Procedure: SHOULDER ARTHROSCOPY WITH BICEPSTENOTOMY;  Surgeon: Leanor Kail, MD;  Location: [facility] ORS;  Service: Orthopedics;  Laterality: Right;  . SHOULDER ARTHROSCOPY WITH DISTAL CLAVICLE RESECTION Right 05/25/2016   Procedure: SHOULDER ARTHROSCOPY WITH DISTAL CLAVICLE RESECTION;  Surgeon: Leanor Kail, MD;  Location: [facility] ORS;  Service: Orthopedics;  Laterality: Right;  . SHOULDER ARTHROSCOPY WITH OPEN ROTATOR CUFF REPAIR Right 05/25/2016   Procedure: SHOULDER ARTHROSCOPY WITH OPEN ROTATOR CUFF REPAIR;  Surgeon: Leanor Kail, MD;  Location: [facility] ORS;  Service: Orthopedics;  Laterality: Right;  . SUBACROMIAL DECOMPRESSION Right 05/25/2016   Procedure: SUBACROMIAL DECOMPRESSION;  Surgeon: Leanor Kail, MD;  Location: [facility] ORS;  Service: Orthopedics;  Laterality: Right;    Family History  Problem Relation Age of Onset  . Breast cancer Paternal Aunt 21  . Diabetes Father   . Heart disease Father   . Breast cancer Daughter 68    Social History:  reports that she quit smoking about 15 years ago. She has a 40.00 pack-year smoking history. She has never used smokeless tobacco. She reports that she does not drink  alcohol and does not use drugs.  The patient denies any exposure to radiation or toxins.  The patient lives in [place], [place].  She is alone today.  Allergies:  Allergies  Allergen Reactions  . Iodinated Diagnostic Agents Anaphylaxis    Other reaction(s): Other (See Comments) Throat swells and extreme hives  . Latex Itching  . Phenobarbital Hives  . Tape Rash    silicones    Medications Prior to Admission  Medication Sig Dispense Refill  . apixaban (ELIQUIS) 5 MG TABS tablet Take 1 tablet (5 mg total) by mouth 2 (two) times daily. 60 tablet 0  . aspirin 81 MG tablet Take 81 mg by mouth daily.    Marland Kitchen atorvastatin (LIPITOR) 40 MG tablet Take 40 mg by mouth daily.    . diphenhydrAMINE (BENADRYL) 25 mg capsule Take 25 mg by mouth daily.    . fluticasone (FLONASE) 50 MCG/ACT nasal spray Place 2 sprays into both nostrils daily. 16 g 2  . furosemide (LASIX) 40 MG tablet Take 1 tablet (40 mg total) by mouth daily. 7 tablet 0  . hydroxyurea (HYDREA) 500 MG capsule TAKE ONE CAPSULE BY MOUTH TWICE DAILY ON MONDAYS ONLY, 1 CAPSULE DAILY ON TUESDAY TO SUNDAY. MAY TAKE WITH FOOD TO MINIMIZE GI SIDE EFFECTS 100 capsule 1  . levETIRAcetam (KEPPRA) 500 MG tablet Take 500 mg by mouth  2 (two) times daily.    . metFORMIN (GLUCOPHAGE) 500 MG tablet Take 500 mg by mouth 2 (two) times daily with a meal.    . Multiple Vitamin (MULTI-VITAMINS) TABS Take 1 tablet by mouth daily.    Marland Kitchen NIFEdipine (NIFEDICAL XL) 30 MG 24 hr tablet Take 30 mg by mouth daily.    . potassium chloride SA (KLOR-CON) 20 MEQ tablet Take 20 mEq by mouth daily.    . sitaGLIPtin (JANUVIA) 25 MG tablet Take 1 tablet by mouth daily.    Marland Kitchen umeclidinium-vilanterol (ANORO ELLIPTA) 62.5-25 MCG/INH AEPB Inhale 1 puff into the lungs daily. 180 each 3  . valsartan-hydrochlorothiazide (DIOVAN-HCT) 320-25 MG tablet Take 1 tablet by mouth daily.  0  . albuterol (ACCUNEB) 1.25 MG/3ML nebulizer solution Take 3 mLs (1.25 mg total) by nebulization every 6  (six) hours as needed. wheezing 75 mL 3  . montelukast (SINGULAIR) 10 MG tablet TAKE 1 TABLET BY MOUTH EVERY DAY (Patient taking differently: Take 10 mg by mouth at bedtime. ) 90 tablet 0  . OXYGEN Inhale 2 L into the lungs.    . traZODone (DESYREL) 100 MG tablet Take by mouth.    . VENTOLIN HFA 108 (90 Base) MCG/ACT inhaler INHALE 2 PUFFS INTO THE LUNGS EVERY 6 HOURS AS NEEDED (Patient taking differently: Inhale 2 puffs into the lungs every 6 (six) hours as needed for wheezing or shortness of breath. ) 54 g 1    Review of Systems  Constitutional: Negative.  Negative for chills, diaphoresis, fever and weight loss.       Feels "ok".  HENT: Negative.  Negative for congestion, ear discharge, ear pain, nosebleeds, sinus pain and sore throat.   Eyes: Negative.  Negative for blurred vision and double vision.  Respiratory: Negative.  Negative for cough, hemoptysis, sputum production and shortness of breath.   Cardiovascular: Negative for chest pain, palpitations and orthopnea.  Gastrointestinal: Positive for melena. Negative for abdominal pain, blood in stool, constipation, diarrhea, heartburn, nausea and vomiting.  Genitourinary: Negative.  Negative for dysuria, frequency, hematuria and urgency.  Musculoskeletal: Negative for back pain, joint pain, myalgias and neck pain.       S/p left foot infection.  Skin: Negative for rash.       Large right sided bruise.  Neurological: Positive for weakness (general). Negative for sensory change, speech change, focal weakness and headaches.  Endo/Heme/Allergies: Bruises/bleeds easily (on Eliquis).  Psychiatric/Behavioral: Negative for depression and memory loss. The patient is not nervous/anxious and does not have insomnia.     Vitals:  Blood pressure 112/71, pulse 95, temperature 97.8 F (36.6 C), temperature source Oral, resp. rate 18, height 5\' 7"  (1.702 m), weight 230 lb 9.6 oz (104.6 kg), SpO2 91 %.   Physical Exam Vitals and nursing note reviewed.   Constitutional:      General: She is not in acute distress.    Appearance: She is not ill-appearing.  HENT:     Head: Normocephalic and atraumatic.     Comments: Short gray hair.    Mouth/Throat:     Mouth: Mucous membranes are moist.     Pharynx: Oropharynx is clear. No oropharyngeal exudate or posterior oropharyngeal erythema.  Eyes:     General: No scleral icterus.    Extraocular Movements: Extraocular movements intact.     Pupils: Pupils are equal, round, and reactive to light.     Comments: Glasses.  Cardiovascular:     Rate and Rhythm: Normal rate and regular rhythm.  Pulmonary:  Effort: Pulmonary effort is normal. No respiratory distress.     Breath sounds: No wheezing, rhonchi or rales.     Comments: Decreased breath sounds at the bases. Abdominal:     General: Bowel sounds are normal. There is no distension.     Palpations: There is no mass.     Tenderness: There is no guarding or rebound.     Comments: Fully round.  Musculoskeletal:     Cervical back: Normal range of motion.     Comments: Chronic lower extremity changes.  Left foot wrapped.  Skin:    General: Skin is warm and dry.     Coloration: Skin is not jaundiced or pale.     Comments: Extensive ecchymosis right side.  Neurological:     Mental Status: She is alert and oriented to person, place, and time. Mental status is at baseline.  Psychiatric:        Mood and Affect: Mood normal.        Behavior: Behavior normal.        Thought Content: Thought content normal.        Judgment: Judgment normal.     Results for orders placed or performed during the hospital encounter of 10/14/20 (from the past 48 hour(s))  Urinalysis, Complete w Microscopic     Status: Abnormal   Collection Time: 10/14/20  7:29 AM  Result Value Ref Range   Color, Urine YELLOW (A) YELLOW   APPearance CLEAR (A) CLEAR   Specific Gravity, Urine 1.009 1.005 - 1.030   pH 5.0 5.0 - 8.0   Glucose, UA NEGATIVE NEGATIVE mg/dL   Hgb urine  dipstick LARGE (A) NEGATIVE   Bilirubin Urine NEGATIVE NEGATIVE   Ketones, ur NEGATIVE NEGATIVE mg/dL   Protein, ur NEGATIVE NEGATIVE mg/dL   Nitrite NEGATIVE NEGATIVE   Leukocytes,Ua NEGATIVE NEGATIVE   RBC / HPF 6-10 0 - 5 RBC/hpf   WBC, UA 0-5 0 - 5 WBC/hpf   Bacteria, UA FEW (A) NONE SEEN   Squamous Epithelial / LPF 0-5 0 - 5   Mucus PRESENT    Hyaline Casts, UA PRESENT     Comment: Performed at [facility], [address] [place], [place] [address]  CBC with Differential     Status: Abnormal   Collection Time: 10/14/20  1:30 PM  Result Value Ref Range   WBC 13.5 (H) 4.0 - 10.5 K/uL   RBC 2.45 (L) 3.87 - 5.11 MIL/uL   Hemoglobin 7.1 (L) 12.0 - 15.0 g/dL   HCT 23.0 (L) 36 - 46 %   MCV 93.9 80.0 - 100.0 fL   MCH 29.0 26.0 - 34.0 pg   MCHC 30.9 30.0 - 36.0 g/dL   RDW 19.8 (H) 11.5 - 15.5 %   Platelets 384 150 - 400 K/uL   nRBC 0.0 0.0 - 0.2 %   Neutrophils Relative % 85 %   Neutro Abs 11.6 (H) 1.7 - 7.7 K/uL   Lymphocytes Relative 3 %   Lymphs Abs 0.4 (L) 0.7 - 4.0 K/uL   Monocytes Relative 7 %   Monocytes Absolute 1.0 0.1 - 1.0 K/uL   Eosinophils Relative 3 %   Eosinophils Absolute 0.3 0.0 - 0.5 K/uL   Basophils Relative 1 %   Basophils Absolute 0.1 0.0 - 0.1 K/uL   Immature Granulocytes 1 %   Abs Immature Granulocytes 0.16 (H) 0.00 - 0.07 K/uL    Comment: Performed at [facility], [address] [place], [place] [address]  Comprehensive metabolic panel     Status: Abnormal   Collection Time: 10/14/20  1:30 PM  Result Value Ref Range   Sodium 138 135 - 145 mmol/L   Potassium 5.6 (H) 3.5 - 5.1 mmol/L   Chloride 103 98 - 111 mmol/L   CO2 27 22 - 32 mmol/L   Glucose, Bld 99 70 - 99 mg/dL    Comment: Glucose reference range applies only to samples taken after fasting for at least 8 hours.   BUN 32 (H) 8 - 23 mg/dL   Creatinine, Ser 1.82 (H) 0.44 - 1.00 mg/dL   Calcium 9.5 8.9 - 10.3 mg/dL   Total Protein 6.1 (L) 6.5 - 8.1 g/dL   Albumin 3.2  (L) 3.5 - 5.0 g/dL   AST 25 15 - 41 U/L   ALT 18 0 - 44 U/L   Alkaline Phosphatase 62 38 - 126 U/L   Total Bilirubin 1.1 0.3 - 1.2 mg/dL   GFR, Estimated 28 (L) >60 mL/min    Comment: (NOTE) Calculated using the CKD-EPI Creatinine Equation (2021)    Anion gap 8 5 - 15    Comment: Performed at [facility], [address], [place], [place] [address]  Ferritin (Iron Binding Protein)     Status: None   Collection Time: 10/14/20  1:30 PM  Result Value Ref Range   Ferritin 45 11 - 307 ng/mL    Comment: Performed at [facility], [address] [place], [place] [address]  Iron and TIBC     Status: Abnormal   Collection Time: 10/14/20  1:30 PM  Result Value Ref Range   Iron 21 (L) 28 - 170 ug/dL   TIBC 340 250 - 450 ug/dL   Saturation Ratios 6 (L) 10.4 - 31.8 %   UIBC 319 ug/dL    Comment: Performed at [facility], [address] [place], [place] [address]  Sedimentation rate     Status: None   Collection Time: 10/14/20  1:30 PM  Result Value Ref Range   Sed Rate 9 0 - 30 mm/hr    Comment: Performed at [facility], [address], [place], [place] [address]  ABO/Rh     Status: None   Collection Time: 10/14/20  1:30 PM  Result Value Ref Range   ABO/RH(D)      O POS Performed at [facility], [address], [place], [place] [address]   Prepare RBC (crossmatch)     Status: None   Collection Time: 10/14/20  4:27 PM  Result Value Ref Range   Order Confirmation      ORDER PROCESSED BY BLOOD BANK Performed at [facility], [address] [place], [place] [address]   Type and screen [place]     Status: None (Preliminary result)   Collection Time: 10/14/20  4:29 PM  Result Value Ref Range   ABO/RH(D) O POS    Antibody Screen NEG    Sample Expiration 10/17/2020,2359    Unit Number [id number]    Blood Component Type RBC, LR IRR    Unit division 00    Status of Unit ISSUED,FINAL     Transfusion Status OK TO TRANSFUSE    Crossmatch Result Compatible    Unit Number [id number]    Blood Component Type RBC, LR IRR    Unit division 00    Status of Unit ISSUED    Unit tag comment IRRADIATED PRODUCT    Transfusion Status OK  TO TRANSFUSE    Crossmatch Result      Compatible Performed at [facility], [address] [place], [place] [address]   Protime-INR     Status: Abnormal   Collection Time: 10/14/20  4:34 PM  Result Value Ref Range   Prothrombin Time 19.6 (H) 11.4 - 15.2 seconds   INR 1.7 (H) 0.8 - 1.2    Comment: (NOTE) INR goal varies based on device and disease states. Performed at [facility], [address] [place], [place] [address]   Resp Panel by RT-PCR (Flu A&B, Covid) Nasopharyngeal Swab     Status: None   Collection Time: 10/14/20  8:32 PM   Specimen: Nasopharyngeal Swab; Nasopharyngeal(NP) swabs in vial transport medium  Result Value Ref Range   SARS Coronavirus 2 by RT PCR NEGATIVE NEGATIVE    Comment: (NOTE) SARS-CoV-2 target nucleic acids are NOT DETECTED.  The SARS-CoV-2 RNA is generally detectable in upper respiratory specimens during the acute phase of infection. The lowest concentration of SARS-CoV-2 viral copies this assay can detect is 138 copies/mL. A negative result does not preclude SARS-Cov-2 infection and should not be used as the sole basis for treatment or other patient management decisions. A negative result may occur with  improper specimen collection/handling, submission of specimen other than nasopharyngeal swab, presence of viral mutation(s) within the areas targeted by this assay, and inadequate number of viral copies(<138 copies/mL). A negative result must be combined with clinical observations, patient history, and epidemiological information. The expected result is Negative.  Fact Sheet for Patients:  EntrepreneurPulse.com.au  Fact Sheet for Healthcare Providers:   IncredibleEmployment.be  This test is no t yet approved or cleared by the [place] FDA and  has been authorized for detection and/or diagnosis of SARS-CoV-2 by FDA under an Emergency Use Authorization (EUA). This EUA will remain  in effect (meaning this test can be used) for the duration of the COVID-19 declaration under Section 564(b)(1) of the Act, 21 U.S.C.section 360bbb-3(b)(1), unless the authorization is terminated  or revoked sooner.       Influenza A by PCR NEGATIVE NEGATIVE   Influenza B by PCR NEGATIVE NEGATIVE    Comment: (NOTE) The Xpert Xpress SARS-CoV-2/FLU/RSV plus assay is intended as an aid in the diagnosis of influenza from Nasopharyngeal swab specimens and should not be used as a sole basis for treatment. Nasal washings and aspirates are unacceptable for Xpert Xpress SARS-CoV-2/FLU/RSV testing.  Fact Sheet for Patients: EntrepreneurPulse.com.au  Fact Sheet for Healthcare Providers: IncredibleEmployment.be  This test is not yet approved or cleared by the [place] FDA and has been authorized for detection and/or diagnosis of SARS-CoV-2 by FDA under an Emergency Use Authorization (EUA). This EUA will remain in effect (meaning this test can be used) for the duration of the COVID-19 declaration under Section 564(b)(1) of the Act, 21 U.S.C. section 360bbb-3(b)(1), unless the authorization is terminated or revoked.  Performed at [facility], [address] [place], [place] [address]   CBC     Status: Abnormal   Collection Time: 10/15/20  4:20 AM  Result Value Ref Range   WBC 11.1 (H) 4.0 - 10.5 K/uL   RBC 2.45 (L) 3.87 - 5.11 MIL/uL   Hemoglobin 7.2 (L) 12.0 - 15.0 g/dL   HCT 23.1 (L) 36 - 46 %   MCV 94.3 80.0 - 100.0 fL   MCH 29.4 26.0 - 34.0 pg   MCHC 31.2 30.0 - 36.0 g/dL   RDW 18.7 (H) 11.5 -  15.5 %   Platelets 305 150 - 400 K/uL   nRBC 0.0 0.0 - 0.2 %    Comment: Performed  at [facility], [address] [place], [place] [address]  Basic metabolic panel     Status: Abnormal   Collection Time: 10/15/20  4:20 AM  Result Value Ref Range   Sodium 139 135 - 145 mmol/L   Potassium 4.7 3.5 - 5.1 mmol/L   Chloride 105 98 - 111 mmol/L   CO2 25 22 - 32 mmol/L   Glucose, Bld 104 (H) 70 - 99 mg/dL    Comment: Glucose reference range applies only to samples taken after fasting for at least 8 hours.   BUN 30 (H) 8 - 23 mg/dL   Creatinine, Ser 1.52 (H) 0.44 - 1.00 mg/dL   Calcium 8.7 (L) 8.9 - 10.3 mg/dL   GFR, Estimated 35 (L) >60 mL/min    Comment: (NOTE) Calculated using the CKD-EPI Creatinine Equation (2021)    Anion gap 9 5 - 15    Comment: Performed at [facility], [address] [place], [place] [address]  CBG monitoring, ED     Status: None   Collection Time: 10/15/20  7:38 AM  Result Value Ref Range   Glucose-Capillary 89 70 - 99 mg/dL    Comment: Glucose reference range applies only to samples taken after fasting for at least 8 hours.  C-reactive protein     Status: Abnormal   Collection Time: 10/15/20  1:15 PM  Result Value Ref Range   CRP 2.6 (H) <1.0 mg/dL    Comment: Performed at [facility] [address], [place], [place] [address]  Prepare RBC (crossmatch)     Status: None   Collection Time: 10/15/20  1:51 PM  Result Value Ref Range   Order Confirmation      ORDER PROCESSED BY BLOOD BANK Performed at [facility], [address] [place], [place] [address]   Lactate dehydrogenase     Status: None   Collection Time: 10/15/20  3:26 PM  Result Value Ref Range   LDH 178 98 - 192 U/L    Comment: Performed at [facility], [address] [place], [place] [address]  Reticulocytes     Status: Abnormal   Collection Time: 10/15/20  3:26 PM  Result Value Ref Range   Retic Ct Pct 5.0 (H) 0.4 - 3.1 %   RBC. 2.63 (L) 3.87 - 5.11 MIL/uL   Retic Count, Absolute 132.0 19.0 - 186.0 K/uL   Immature Retic  Fract 38.9 (H) 2.3 - 15.9 %    Comment: Performed at [facility], [address] [place], [place] [address]  Glucose, capillary     Status: Abnormal   Collection Time: 10/15/20  5:55 PM  Result Value Ref Range   Glucose-Capillary 117 (H) 70 - 99 mg/dL    Comment: Glucose reference range applies only to samples taken after fasting for at least 8 hours.  Glucose, capillary     Status: None   Collection Time: 10/15/20  6:35 PM  Result Value Ref Range   Glucose-Capillary 99 70 - 99 mg/dL    Comment: Glucose reference range applies only to samples taken after fasting for at least 8 hours.  Glucose, capillary     Status: None   Collection Time: 10/15/20  8:58 PM  Result Value Ref Range   Glucose-Capillary 95 70 - 99 mg/dL    Comment: Glucose reference range applies  only to samples taken after fasting for at least 8 hours.   DG Shoulder Right  Result Date: 10/14/2020 CLINICAL DATA:  [place], bruising, glenoid abnormality on CT EXAM: RIGHT SHOULDER - 2+ VIEW COMPARISON:  10/14/2020 FINDINGS: Internal rotation, external rotation, and transscapular views of the right shoulder are obtained. There are no acute displaced fractures. Severe right shoulder osteoarthritis is seen with glenohumeral and acromioclavicular joint hypertrophic changes. Veiling opacity at the right lung base consistent with known pleural effusion. IMPRESSION: 1. No acute displaced fracture. 2. Severe right shoulder osteoarthritis. 3. Right pleural effusion. Electronically Signed   By: Randa Ngo M.D.   On: 10/14/2020 18:55   CT CHEST ABDOMEN PELVIS WO CONTRAST  Result Date: 10/14/2020 CLINICAL DATA:  Fall, low hemoglobin and hematocrit, bruising in the right upper back and right shoulder pain. History of fallopian tube malignancy noted during hysterectomy. History of CHF, COPD, CKD EXAM: CT CHEST, ABDOMEN AND PELVIS WITHOUT CONTRAST TECHNIQUE: Multidetector CT imaging of the chest, abdomen and pelvis was performed  following the standard protocol without IV contrast. COMPARISON:  CT 01/16/2020, 11/16/2012, renal ultrasound 10/04/2020 FINDINGS: CT CHEST FINDINGS Cardiovascular: Limited assessment of the vasculature in the absence of contrast media. Cardiomegaly with predominantly right atrial enlargement. Hypoattenuation of cardiac blood pool compatible with anemia. Three-vessel coronary artery atherosclerosis. Trace pericardial effusion. Atherosclerotic plaque within the normal caliber aorta. Shared origin of the brachiocephalic and left common carotid arteries. Calcification of the proximal great vessels as well. Central pulmonary arteries are normal caliber. Luminal assessment precluded. No major venous abnormalities are seen. Mediastinum/Nodes: No mediastinal fluid or gas. Normal thyroid gland and thoracic inlet. No acute abnormality of the trachea or esophagus. No worrisome mediastinal or axillary adenopathy. Hilar nodal evaluation is limited in the absence of intravenous contrast media. Lungs/Pleura: Low-attenuation bilateral pleural effusions. Adjacent areas of passive atelectasis. Mild airways thickening. Some additional patchy opacities are present in the partially atelectatic left lower lobe, underlying aspiration or airspace disease not excluded. No pneumothorax. Background of centrilobular predominant emphysematous changes. Clustered micro nodules are seen in the anterior segment left upper lobe and lingula, possibly infectious or inflammatory. No concerning pulmonary nodules or masses. Musculoskeletal: Soft tissue swelling and contusive changes along the right lateral chest wall. There is a lobular hyperdense collection along the right chest wall concerning for an actively bleeding hematoma in the setting of this noncontrast CT. This measures up to 3.9 cm in maximal thickness extending 14.5 cm in AP diameter and 17.5 cm in craniocaudal extent. Majority of this collection appears subjacent to the serve radius though  there is loss of discernible fat plane suggesting a possible intramuscular source. Portions of the shoulders are collimated from view. There is questionable cortical step-off along the posterior aspect of the glenoid in the right shoulder seen on the first images of the sequences (2/1), and a glenoid injury is not fully excluded. Consider dedicated imaging. Additionally, sagittal reconstruction there is a mild cortical step-off along the superior aspect of the manubrium albeit without adjacent soft tissue swelling thickening. Possibly related to step artifact though a minimally displaced fracture is certainly possible. Correlate point tenderness. No visible displaced rib fractures. Several remote appearing deformities of the left anterolateral lower thoracic ribs, several seen on comparison from March 2021. Multilevel degenerative changes are present in the imaged portions of the spine. Multilevel flowing anterior osteophytosis, compatible with features of diffuse idiopathic skeletal hyperostosis (DISH). Intraosseous hemangioma noted at T12. Additional degenerative changes in the shoulders. CT ABDOMEN PELVIS FINDINGS Hepatobiliary:  Possible hypoattenuating lesion abutting the gallbladder fossa (2/66) measuring approximately 1.8 cm in size, incompletely characterized on this unenhanced CT with diminished imaging quality due to motion artifact. Hypertrophy of the left lobe liver with a nodular liver surface contour suggestive of cirrhotic changes. Normal liver attenuation. Normal gallbladder distention. No visible calcified gallstones. Stable surgical clips near the gallbladder fundus. Pericholecystic fluid may reflect redistributed ascites. No biliary ductal dilatation. Pancreas: Partial fatty replacement of the pancreas. No pancreatic ductal dilatation or surrounding inflammatory changes. Spleen: Splenomegaly. No concerning focal splenic lesions. Capsular calcification along the posterior spleen (2/64) stable from  prior may reflect sequela of prior injury. Adrenals/Urinary Tract: Macroscopic fat containing lesion in the medial limb of the left adrenal gland suggestive of myelolipoma while a more intermediate to low-attenuation lesion in the body could reflect small adrenal adenoma. Overall stable appearance from prior accounting for motion artifact and differences in technique. No concerning right adrenal lesion. Redemonstration of a 4.5 cm fluid attenuation cyst in the upper pole left kidney, not significantly changed from prior. Stable mild bilateral perinephric stranding is nonspecific can be seen with advanced age or diminished renal function. No new concerning focal renal lesion. No urolithiasis or hydronephrosis. Urinary bladder is unremarkable for the degree of distention. Stomach/Bowel: Distal esophagus, stomach and duodenal sweep are unremarkable. No small bowel wall thickening or dilatation. No evidence of obstruction. Appendix is not visualized. No focal inflammation the vicinity of the cecum to suggest an occult appendicitis. Cecum displaced to the midline pelvis without frank volvulus. Moderate colonic stool burden. No colonic dilatation or wall thickening. Scattered colonic diverticula without focal inflammation to suggest diverticulitis. Vascular/Lymphatic: No worrisome abdominopelvic adenopathy. Multiple surgical clips in the retroperitoneum likely reflecting prior nodal dissection. Extensive atherosclerotic calcification is similar to prior. Luminal evaluation severely limited in the absence of contrast media. Reproductive: Uterus is surgically absent. No concerning adnexal lesions. No concerning adnexal lesions are identified. Other: Extensive circumferential body wall edema. More focal soft tissue thickening of the right chest wall and flank may reflect some additional contusive change in the setting of recent fall. No traumatic abdominal wall dehiscence. No bowel containing hernias. Postsurgical changes  from low vertical midline incision. Musculoskeletal: No acute fracture or traumatic osseous injury of the lumbar spine or bony pelvis. Proximal femora are intact and normally located. Unchanged grade 1 anterolisthesis L4 on 5. Multilevel degenerative changes are present in the imaged portions of the spine. Additional degenerative changes throughout the hips and pelvis. No worrisome osseous lesions. IMPRESSION: Traumatic Findings: 1. Contusive changes along the right posterolateral chest wall and flank and lateral right breast soft tissues. Larger heterogeneous, hyperdense chest wall hematoma which appears subjacent to or possibly arising from the serrated is musculature. Concerning for acute hemorrhage in the setting of declining hemoglobin. No visible displaced rib fractures though bony demineralization may detection of subtle nondisplaced fracture. 2. Possible fracture along the posterior right glenoid, incompletely imaged on this exam. Consider dedicated right shoulder imaging. 3. Additional questionable cortical step-off of the superior manubrium albeit without adjacent soft tissue thickening or stranding. Correlate for point tenderness to assess for nondisplaced fracture. 4. Unenhanced CT was performed per clinician order. Lack of IV contrast limits sensitivity and specificity, especially for evaluation of abdominal/pelvic solid viscera. 5. Ill-defined questionable hypoattenuating 1.8 cm lesion adjacent the gallbladder fossa. A rounded configuration is less suggestive of a acute traumatic liver lesion though not fully excluded. Regardless, at minimum further assessment with outpatient MRI should be considered given underlying intrinsic liver  disease/cirrhosis. Nontraumatic Findings: 1. Features of volume overload/anasarca with diffuse body wall edema, bilateral pleural effusions, ascites. Possibly related to a chronic history of CHF or underlying cirrhosis. 2. Passive atelectatic changes adjacent the pleural  effusions with some more patchy opacity in the left lung base and micro nodularity in the left upper lobe and lingula which could reflect a superimposed infectious or inflammatory process. 3. Stigmata of cirrhosis include a nodular liver, left lobe hypertrophy, and splenomegaly. 4. 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 5. Mild bilateral perinephric stranding, can reflect diminished renal function or senescent change though could correlate for urinary symptoms and consider urinalysis as appropriate. 6. Postsurgical changes from prior retroperitoneal nodal dissection. 7. Prior hysterectomy. 8. Cardiomegaly, three-vessel coronary artery atherosclerosis. 9.  Aortic Atherosclerosis (ICD10-I70.0). 10.  Emphysema (ICD10-J43.9). These results were called by telephone at the time of interpretation on 10/14/2020 at 5:44 pm to provider [facility] , who verbally acknowledged these results. Electronically Signed   By: Lovena Le M.D.   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  Chest, abdomen, and pelvis CT on 10/14/220 revealed contusive changes along the right posterolateral chest wall, flank and lateral right breast soft tissues.  There was a larger heterogeneous, hyperdense chest wall hematoma which appeard subjacent to or possibly arising from the serrated is musculature, concerning for acute hemorrhage.  There were no visible displaced rib fractures.  There was an iIll-defined questionable hypoattenuating 1.8 cm lesion adjacent the gallbladder fossa (outpatient MRI should be considered given underlying intrinsic liver disease/cirrhosis).  There were features of volume overload/anasarca with diffuse body wall edema, bilateral pleural effusions, ascites, possibly related to a chronic history of CHF or  underlying cirrhosis.  There was passive atelectatic changes adjacent the pleural effusions with some more patchy opacity in the left lung base and micro nodularity in the LUL and lingula which could reflect a superimposed infectious or inflammatory process.  There was stigmata of cirrhosis include a nodular liver, left lobe hypertrophy, and splenomegaly.  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  Colonoscopy on 06/01/2020 revealed erythematous mucosa in the descending, transverse, and ascending colon and diverticulosis in the sigmoid colon. There were non-bleeding external hemorrhoids. Four polyps were removed (tubular adenomas). Pathology revealed diffuse microscopic colitis.  Symptomatically, she notes dark stool.  She denies any hematochezia, hematuria or vaginal bleeding.  Plan:   1.   Polycythemia rubra vera  Patient has been on a stable dose of hydroxyurea.  WBC and platelet count are normal.  Continue hydroxyurea. 2.   Normocytic anemia  Hematocrit 34.7, hemoglobin 11.5, MCV 103.9 on 07/08/2020.  Hematocrit 36.2, hemoglobin 11.3, MCV   90.5 on 09/28/2020.  Hematocrit 31.2, hemoglobin   9.7, MCV   90.2 on 10/06/2020.  Hematocrit 22.3, hemoglobin   6.9, MCV   93.3 on 10/14/2020.  Hematocrit 23.1, hemoglobin   7.2, MCV   94.3 on 10/15/2020.  Patient had a chronic macrocytic anemia since prior to last admission secondary to hydroxyurea.  Normocytic anemia suggests iron deficiency.   Ferritin 45 with iron saturation of 6% and a TIBC of 340.   Avoid aggressive supplementation of iron secondary to diagnosis of PV.  There may have been some component of myelosuppression secondary to Septra.  Retic 5% suggests recovering marrow.  Haptoglobin pending.  LDH is  178 (normal) not suggesting hemolysis.  Coombs ordered.  There may be some component of anemia of chronic renal disease (Cr 1.82; CrCl 38.6 ml/min).  Guaiac all stools given patient's report of dark stools/melena.  Unclear  significance of large hemoglobin on UA.  Check CBC daily.  Anticipate daily improvement. 3.   Abnormal CT findings  Plan follow-up in outpatient department. 4.   Anticoagulation  Patient prescribed Eliquis for atrial fibrillation.  Patient is followed by Dr Ubaldo Glassing.  Currently Eliquis is on hold.  Follow-up with cardiology.  Thank you for allowing me to participate in Angelica Juarez 's care.  I will follow her closely with you while hospitalized and after discharge in the outpatient department.   Lequita Asal, MD  10/16/2020, 1:24 AM

## 2020-10-16 NOTE — Consult Note (Signed)
WOC Nurse Consult Note: Reason for Consult: 2 sutures to left upper back from skin biopsy.  Clean, approximated edges.  LEft foot wounds on medial and lateral aspects.  Were present on last admission  Wound type: unclear etiology Pressure Injury POA: NA Measurement:Left foot:  Medial aspect near malleolus:  2 cm x 7 cm dry crusted brown wound bed Left lateral foot 1 cm round scabbed wound.  Wound bed:dry and crusted. Will implement moist topical therapy.  Drainage (amount, consistency, odor) None  Dry.  Periwound:Dry skin to bilateral lower legs Dressing procedure/placement/frequency: Cleanse wounds to left foot with NS.  Apply mupirocin ointment to wound bed and cover with telfa and kerlix/tape  Moisturize both legs with sween cream daily.  Will not follow at this time.  Please re-consult if needed.  Domenic Moras MSN, RN, FNP-BC CWON Wound, Ostomy, Continence Nurse Pager [phone]

## 2020-10-16 NOTE — TOC Initial Note (Signed)
Transition of Care [facility]) - Initial/Assessment Note    Patient Details  Name: Angelica Juarez MRN: [id number] Date of Birth: 15-Feb-1941  Transition of [facility]) CM/SW Contact:    Candie Chroman, LCSW Phone Number: 10/16/2020, 10:51 AM  Clinical Narrative: Readmission prevention screen completed on 11/17: "Patient admitted with cellulitis of the leg and found to be in acute exacerbation of CHF. Patient states that she lives at home with her significant other who provides transportation to appointments  PCP [place] - denies issues obtaining medications Patient states that she wears 2L chronic O2 at home and has portable O2.  Does not recall the company  Patient in agreement for home health RN for management of CHF and COPD.  States she does not have a preference of agency.  Referral made and accepted by Corene Cornea with [facility]  Patient confirms that she has a scale in the home to monitor daily weights "  Patient was discharged to [facility] for rehab on 11/23 and she confirmed plan is to return at discharge. CSW spoke with admissions coordinator and confirmed they can accept her back at discharge. No further concerns. CSW encouraged patient to contact CSW as needed. CSW will continue to follow patient for support and facilitate return to SNF once medically stable.                Expected Discharge Plan: Skilled Nursing Facility Barriers to Discharge: Continued Medical Work up   Patient Goals and CMS Choice     Choice offered to / list presented to : NA  Expected Discharge Plan and Services Expected Discharge Plan: [facility] Acute Care Choice: Resumption of Svcs/PTA Provider Living arrangements for the past 2 months: Single Family Home, [place]                                      Prior Living Arrangements/Services Living arrangements for the past 2 months: [place], [place] Lives with:: Significant Other Patient language and need for interpreter reviewed:: Yes Do you feel safe going back to the place where you live?: Yes      Need for Family Participation in Patient Care: Yes (Comment) Care giver support system in place?: Yes (comment)   Criminal Activity/Legal Involvement Pertinent to Current Situation/Hospitalization: No - Comment as needed  Activities of Daily Living Home Assistive Devices/Equipment: Oxygen, Eyeglasses, Walker (specify type), Dentures (specify type), CBG Meter, Blood pressure cuff, Cane (specify quad or straight), Shower chair without back, Grab bars in shower ADL Screening (condition at time of admission) Patient's cognitive ability adequate to safely complete daily activities?: Yes Is the patient deaf or have difficulty hearing?: No Does the patient have difficulty seeing, even when wearing glasses/contacts?: No Does the patient have difficulty concentrating, remembering, or making decisions?: No Patient able to express need for assistance with ADLs?: Yes Does the patient have difficulty dressing or bathing?: No Independently performs ADLs?: Yes (appropriate for developmental age) Does the patient have difficulty walking or climbing stairs?: Yes Weakness of Legs: None Weakness of Arms/Hands: None  Permission Sought/Granted Permission sought to share information with : Facility Art therapist granted to share information with : Yes, Verbal Permission Granted     Permission granted to share info w AGENCY: [facility]        Emotional  Assessment Appearance:: Appears stated age Attitude/Demeanor/Rapport: Engaged, Gracious Affect (typically observed): Accepting, Appropriate, Calm, Pleasant Orientation: : Oriented to Self, Oriented to Place, Oriented to  Time, Oriented to Situation Alcohol / Substance Use: Not Applicable Psych Involvement: No (comment)  Admission diagnosis:  Acute blood  loss anemia [D62] Trauma [T14.90XA] Symptomatic anemia [D64.9] Hematoma of right chest wall, initial encounter [S20.211A] Patient Active Problem List   Diagnosis Date Noted  . Symptomatic anemia 10/15/2020  . Macrocytic anemia 10/14/2020  . Acute blood loss anemia 10/14/2020  . Chronic respiratory failure ([place]) 10/14/2020  . Chronic kidney disease   . Hyperkalemia   . Chest wall hematoma, right, subsequent encounter   . Acute on chronic diastolic CHF (congestive heart failure) ([place]) 09/29/2020  . Cellulitis of left leg 09/28/2020  . Rash and nonspecific skin eruption 09/28/2020  . Left leg cellulitis 09/28/2020  . Normocytic anemia 06/11/2020  . COPD with acute exacerbation ([place]) 06/11/2020  . Chronic diarrhea of unknown origin   . Acute left flank pain 01/15/2020  . Hypokalemia 11/14/2019  . Proteinuria 08/09/2019  . Acute on chronic congestive heart failure ([place]) 07/09/2019  . Nonrheumatic mitral valve regurgitation 07/08/2019  . Other persistent atrial fibrillation ([place]) 07/08/2019  . C. difficile diarrhea 11/24/2018  . Diarrhea 11/18/2018  . Nausea without vomiting 11/18/2018  . Goals of care, counseling/discussion 09/12/2018  . Polycythemia rubra vera ([place]) 08/08/2018  . History of repair of right rotator cuff 08/25/2016  . Acute respiratory failure with hypoxia ([place]) 08/06/2016  . Nontraumatic tear of right rotator cuff 04/21/2016  . Atypical chest pain 04/07/2016  . Impingement syndrome of right shoulder 12/29/2015  . Primary osteoarthritis of first carpometacarpal joint of left hand 10/27/2015  . Right shoulder pain 10/27/2015  . OSA on CPAP 10/14/2015  . COPD (chronic obstructive pulmonary disease) ([place]) 07/13/2015  . Type 2 diabetes mellitus with microalbuminuria, without long-term current use of insulin ([place]) 07/13/2015  . Hypertension 03/05/2015  . Difficulty sleeping 11/26/2014  . Chronic kidney disease, stage 3a 07/31/2014  . Seizure disorder ([place]) 07/31/2014   . History of colonic polyps 05/21/2014  . Hyperlipidemia, unspecified 04/30/2014   PCP:  Sofie Hartigan, MD Pharmacy:   [facility] [place], [place] [facility] [address] [place] [address] Phone: [phone] Fax: [phone]     Social Determinants of Health (SDOH) Interventions    Readmission Risk Interventions Readmission Risk Prevention Plan 10/06/2020 09/30/2020 06/03/2019  Transportation Screening - Complete Complete  PCP or Specialist Appt within 5-7 Days - - Complete  Home Care Screening - - Complete  Medication Review (RN CM) - - Complete  HRI or Home Care Consult - Complete -  Palliative Care Screening - Not Applicable -  Medication Review (RN Care Manager) - Complete -  [place] or Home Care Consult Complete - -  Palliative Care Screening Not Applicable - -  Skilled Nursing Facility Complete - -  Some recent data might be hidden

## 2020-10-17 DIAGNOSIS — R188 Other ascites: Secondary | ICD-10-CM

## 2020-10-17 DIAGNOSIS — T1490XA Injury, unspecified, initial encounter: Secondary | ICD-10-CM | POA: Diagnosis not present

## 2020-10-17 DIAGNOSIS — D45 Polycythemia vera: Secondary | ICD-10-CM

## 2020-10-17 DIAGNOSIS — K7460 Unspecified cirrhosis of liver: Secondary | ICD-10-CM

## 2020-10-17 DIAGNOSIS — S20211A Contusion of right front wall of thorax, initial encounter: Secondary | ICD-10-CM | POA: Diagnosis not present

## 2020-10-17 DIAGNOSIS — I4819 Other persistent atrial fibrillation: Secondary | ICD-10-CM

## 2020-10-17 DIAGNOSIS — D62 Acute posthemorrhagic anemia: Secondary | ICD-10-CM | POA: Diagnosis not present

## 2020-10-17 LAB — COMPREHENSIVE METABOLIC PANEL
ALT: 14 U/L (ref 0–44)
AST: 22 U/L (ref 15–41)
Albumin: 3 g/dL — ABNORMAL LOW (ref 3.5–5.0)
Alkaline Phosphatase: 68 U/L (ref 38–126)
Anion gap: 9 (ref 5–15)
BUN: 34 mg/dL — ABNORMAL HIGH (ref 8–23)
CO2: 27 mmol/L (ref 22–32)
Calcium: 9.2 mg/dL (ref 8.9–10.3)
Chloride: 101 mmol/L (ref 98–111)
Creatinine, Ser: 1.23 mg/dL — ABNORMAL HIGH (ref 0.44–1.00)
GFR, Estimated: 45 mL/min — ABNORMAL LOW (ref >60)
Glucose, Bld: 115 mg/dL — ABNORMAL HIGH (ref 70–99)
Potassium: 4.7 mmol/L (ref 3.5–5.1)
Sodium: 137 mmol/L (ref 135–145)
Total Bilirubin: 1.4 mg/dL — ABNORMAL HIGH (ref 0.3–1.2)
Total Protein: 5.5 g/dL — ABNORMAL LOW (ref 6.5–8.1)

## 2020-10-17 LAB — CBC
HCT: 27.1 % — ABNORMAL LOW (ref 36.0–46.0)
Hemoglobin: 8.7 g/dL — ABNORMAL LOW (ref 12.0–15.0)
MCH: 29.4 pg (ref 26.0–34.0)
MCHC: 32.1 g/dL (ref 30.0–36.0)
MCV: 91.6 fL (ref 80.0–100.0)
Platelets: 334 10*3/uL (ref 150–400)
RBC: 2.96 MIL/uL — ABNORMAL LOW (ref 3.87–5.11)
RDW: 19.5 % — ABNORMAL HIGH (ref 11.5–15.5)
WBC: 11.3 10*3/uL — ABNORMAL HIGH (ref 4.0–10.5)
nRBC: 0 % (ref 0.0–0.2)

## 2020-10-17 LAB — GLUCOSE, CAPILLARY
Glucose-Capillary: 101 mg/dL — ABNORMAL HIGH (ref 70–99)
Glucose-Capillary: 113 mg/dL — ABNORMAL HIGH (ref 70–99)
Glucose-Capillary: 102 mg/dL — ABNORMAL HIGH (ref 70–99)
Glucose-Capillary: 118 mg/dL — ABNORMAL HIGH (ref 70–99)

## 2020-10-17 LAB — OCCULT BLOOD X 1 CARD TO LAB, STOOL: Fecal Occult Bld: POSITIVE — AB

## 2020-10-17 LAB — AFP TUMOR MARKER: AFP, Serum, Tumor Marker: 3 ng/mL (ref 0.0–8.3)

## 2020-10-17 MED ORDER — FUROSEMIDE 10 MG/ML IJ SOLN
40.0000 mg | Freq: Every day | INTRAMUSCULAR | Status: DC
  Administered 2020-10-17 – 2020-10-19 (×3): 40 mg via INTRAVENOUS
  Filled 2020-10-17 (×3): qty 4

## 2020-10-17 NOTE — Progress Notes (Signed)
Hematology/Oncology Progress Note [facility] Telephone:([phone] [phone]  Patient Care Team: Sofie Hartigan, MD as PCP - General (Family Medicine) Allyne Gee, MD as Consulting Physician (Pulmonary Disease) Anthonette Legato, MD as Consulting Physician (Nephrology)   Name of the patient: Angelica Juarez  [id number]  10-20-1941  Date of visit: 10/17/20   INTERVAL HISTORY-   Patient sitting at the side of her bed. She reports feeling pretty well today. Still feels weak but better. Denies any shortness of breath. Right side pain is better.  She denies any alcohol use currently. She never been a heavy drinker.. she concerns about her new diagnosis of liver cirrhosis.   Review of systems- Review of Systems  Constitutional: Positive for fatigue. Negative for appetite change, chills and fever.  HENT:   Negative for hearing loss and voice change.   Eyes: Negative for eye problems.  Respiratory: Negative for chest tightness, cough and shortness of breath.   Cardiovascular: Negative for chest pain.  Gastrointestinal: Negative for abdominal distention, abdominal pain and blood in stool.  Endocrine: Negative for hot flashes.  Genitourinary: Negative for difficulty urinating and frequency.   Musculoskeletal: Negative for arthralgias.  Skin: Negative for itching and rash.       Right side bruising  Neurological: Negative for extremity weakness.  Hematological: Negative for adenopathy.  Psychiatric/Behavioral: Negative for confusion.    Allergies  Allergen Reactions  . Iodinated Diagnostic Agents Anaphylaxis    Other reaction(s): Other (See Comments) Throat swells and extreme hives  . Lamictal [Lamotrigine] Rash    Suspected drug rash 09/2020  . Latex Itching  . Phenobarbital Hives  . Tape Rash    silicones    Patient Active Problem List   Diagnosis Date Noted  . Symptomatic anemia 10/15/2020  . Macrocytic anemia 10/14/2020  . Acute blood  loss anemia 10/14/2020  . Chronic respiratory failure ([place]) 10/14/2020  . Chronic kidney disease   . Hyperkalemia   . Chest wall hematoma, right, subsequent encounter   . Acute on chronic diastolic CHF (congestive heart failure) ([place]) 09/29/2020  . Cellulitis of left leg 09/28/2020  . Rash and nonspecific skin eruption 09/28/2020  . Left leg cellulitis 09/28/2020  . Normocytic anemia 06/11/2020  . COPD with acute exacerbation ([place]) 06/11/2020  . Chronic diarrhea of unknown origin   . Acute left flank pain 01/15/2020  . Hypokalemia 11/14/2019  . Proteinuria 08/09/2019  . Acute on chronic congestive heart failure ([place]) 07/09/2019  . Nonrheumatic mitral valve regurgitation 07/08/2019  . Other persistent atrial fibrillation ([place]) 07/08/2019  . C. difficile diarrhea 11/24/2018  . Diarrhea 11/18/2018  . Nausea without vomiting 11/18/2018  . Goals of care, counseling/discussion 09/12/2018  . 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 History of repair of right rotator cuff 08/25/2016  . Acute respiratory failure with hypoxia ([place]) 08/06/2016  . Nontraumatic tear of right rotator cuff 04/21/2016  . Atypical chest pain 04/07/2016  . Impingement syndrome of right shoulder 12/29/2015  . Primary osteoarthritis of first carpometacarpal joint of left hand 10/27/2015  . Right shoulder pain 10/27/2015  . OSA on CPAP 10/14/2015  . COPD (chronic obstructive pulmonary disease) ([place]) 07/13/2015  . Type 2 diabetes mellitus with microalbuminuria, without long-term current use of insulin ([place]) 07/13/2015  . Hypertension 03/05/2015  . Difficulty sleeping 11/26/2014  . Chronic kidney disease, stage 3a 07/31/2014  . Seizure disorder ([place]) 07/31/2014  . History of colonic polyps 05/21/2014  . Hyperlipidemia, unspecified 04/30/2014  Past Medical History:  Diagnosis Date  . Allergy    Seasonal  . Arthritis   . 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 Chronic kidney disease    chronic  renal insufficiency  . Colon polyps 05/21/14  . COPD (chronic obstructive pulmonary disease) ([place])   . Diabetes mellitus without complication ([place])   . Dyspnea   . Fracture closed, humerus, shaft    right   . History of kidney stones    40 years ago  . Hyperlipidemia 04/30/14  . Hypertension   . Impingement syndrome of right shoulder 12/29/15  . Personal history of radiation therapy   . Pneumonia    hx  . Rotator cuff tear 04/21/16   right  . Seizures ([place])   . Sleep apnea      Past Surgical History:  Procedure Laterality Date  . ABDOMINAL HYSTERECTOMY    . COLONOSCOPY WITH PROPOFOL N/A 06/01/2020   Procedure: COLONOSCOPY WITH PROPOFOL;  Surgeon: Lin Landsman, MD;  Location: [facility] ENDOSCOPY;  Service: Gastroenterology;  Laterality: N/A;  . ESOPHAGOGASTRODUODENOSCOPY (EGD) WITH PROPOFOL N/A 06/01/2020   Procedure: ESOPHAGOGASTRODUODENOSCOPY (EGD) WITH PROPOFOL;  Surgeon: Lin Landsman, MD;  Location: [facility] ENDOSCOPY;  Service: Gastroenterology;  Laterality: N/A;  . 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 EYE SURGERY     bilateral cataracts  . JOINT REPLACEMENT     bilateral knee replacement  . NASAL SEPTUM SURGERY    . OOPHORECTOMY    . REPLACEMENT TOTAL KNEE Bilateral 2004  . SHOULDER ARTHROSCOPY WITH BICEPSTENOTOMY Right 05/25/2016   Procedure: SHOULDER ARTHROSCOPY WITH BICEPSTENOTOMY;  Surgeon: Leanor Kail, MD;  Location: [facility] ORS;  Service: Orthopedics;  Laterality: Right;  . SHOULDER ARTHROSCOPY WITH DISTAL CLAVICLE RESECTION Right 05/25/2016   Procedure: SHOULDER ARTHROSCOPY WITH DISTAL CLAVICLE RESECTION;  Surgeon: Leanor Kail, MD;  Location: [facility] ORS;  Service: Orthopedics;  Laterality: Right;  . SHOULDER ARTHROSCOPY WITH OPEN ROTATOR CUFF REPAIR Right 05/25/2016   Procedure: SHOULDER ARTHROSCOPY WITH OPEN ROTATOR CUFF REPAIR;  Surgeon: Leanor Kail, MD;  Location: [facility] ORS;  Service: Orthopedics;  Laterality: Right;  . SUBACROMIAL  DECOMPRESSION Right 05/25/2016   Procedure: SUBACROMIAL DECOMPRESSION;  Surgeon: Leanor Kail, MD;  Location: [facility] ORS;  Service: Orthopedics;  Laterality: Right;    Social History   Socioeconomic History  . Marital status: Single    Spouse name: Not on file  . Number of children: Not on file  . Years of education: Not on file  . Highest education level: Not on file  Occupational History  . Not on file  Tobacco Use  . Smoking status: Former Smoker    Packs/day: 1.00    Years: 40.00    Pack years: 40.00    Quit date: 05/12/2005    Years since quitting: 15.4  . Smokeless tobacco: Never Used  Vaping Use  . Vaping Use: Never used  Substance and Sexual Activity  . Alcohol use: No  . Drug use: No  . Sexual activity: Not on file  Other Topics Concern  . Not on file  Social History Narrative  . Not on file   Social Determinants of Health   Financial Resource Strain:   . Difficulty of Paying Living Expenses: Not on file  Food Insecurity:   . Worried About Charity fundraiser in the Last Year: Not on file  . Ran Out of Food in the Last Year: Not on file  Transportation Needs:   .  Lack of Transportation (Medical): Not on file  . Lack of Transportation (Non-Medical): Not on file  Physical Activity:   . Days of Exercise per Week: Not on file  . Minutes of Exercise per Session: Not on file  Stress:   . Feeling of Stress : Not on file  Social Connections:   . Frequency of Communication with Friends and Family: Not on file  . Frequency of Social Gatherings with Friends and Family: Not on file  . Attends Religious Services: Not on file  . Active Member of Clubs or Organizations: Not on file  . Attends Archivist Meetings: Not on file  . Marital Status: Not on file  Intimate Partner Violence:   . Fear of Current or Ex-Partner: Not on file  . Emotionally Abused: Not on file  . Physically Abused: Not on file  . Sexually Abused: Not on file     Family History    Problem Relation Age of Onset  . Breast cancer Paternal Aunt 4  . Diabetes Father   . Heart disease Father   . Breast cancer Daughter 46     Current Facility-Administered Medications:  .  0.9 %  sodium chloride infusion, 250 mL, Intravenous, PRN, Agbata, Tochukwu, MD .  acetaminophen (TYLENOL) tablet 650 mg, 650 mg, Oral, Q6H PRN, 650 mg at 10/16/20 0845 **OR** acetaminophen (TYLENOL) suppository 650 mg, 650 mg, Rectal, Q6H PRN, Agbata, Tochukwu, MD .  albuterol (PROVENTIL) (2.5 MG/3ML) 0.083% nebulizer solution 1.25 mg, 1.25 mg, Nebulization, Q6H PRN, Agbata, Tochukwu, MD .  atorvastatin (LIPITOR) tablet 40 mg, 40 mg, Oral, Daily, Agbata, Tochukwu, MD, 40 mg at 10/17/20 0820 .  diphenhydrAMINE (BENADRYL) capsule 25 mg, 25 mg, Oral, Daily, Agbata, Tochukwu, MD, 25 mg at 10/17/20 0824 .  fluticasone (FLONASE) 50 MCG/ACT nasal spray 2 spray, 2 spray, Each Nare, Daily, Agbata, Tochukwu, MD, 2 spray at 10/17/20 0823 .  furosemide (LASIX) injection 40 mg, 40 mg, Intravenous, Daily, Bonnell Public Tublu, MD, 40 mg at 10/17/20 1642 .  [START ON 10/19/2020] hydroxyurea (HYDREA) capsule 500 mg, 500 mg, Oral, 2 times per day on Mon **AND** hydroxyurea (HYDREA) capsule 500 mg, 500 mg, Oral, Once per day on Sun Tue Wed Thu Fri Sat, Belue, Nathan S, RPH, 500 mg at 10/17/20 0820 .  insulin aspart (novoLOG) injection 0-15 Units, 0-15 Units, Subcutaneous, TID WC, Agbata, Tochukwu, MD .  levETIRAcetam (KEPPRA) tablet 500 mg, 500 mg, Oral, BID, Agbata, Tochukwu, MD, 500 mg at 10/17/20 0819 .  montelukast (SINGULAIR) tablet 10 mg, 10 mg, Oral, Daily, Agbata, Tochukwu, MD, 10 mg at 10/17/20 0819 .  multivitamin with minerals tablet 1 tablet, 1 tablet, Oral, Daily, Agbata, Tochukwu, MD, 1 tablet at 10/17/20 0819 .  mupirocin ointment (BACTROBAN) 2 %, , Topical, Daily, Vashti Hey, MD, Given at 10/17/20 [id number] .  ondansetron (ZOFRAN) tablet 4 mg, 4 mg, Oral, Q6H PRN **OR** ondansetron (ZOFRAN)  injection 4 mg, 4 mg, Intravenous, Q6H PRN, Agbata, Tochukwu, MD .  pneumococcal 23 valent vaccine (PNEUMOVAX-23) injection 0.5 mL, 0.5 mL, Intramuscular, Once, Jamse Arn, Dewaine Oats Tublu, MD .  sodium chloride flush (NS) 0.9 % injection 3 mL, 3 mL, Intravenous, Q12H, Agbata, Tochukwu, MD, 3 mL at 10/17/20 0828 .  sodium chloride flush (NS) 0.9 % injection 3 mL, 3 mL, Intravenous, PRN, Agbata, Tochukwu, MD .  traZODone (DESYREL) tablet 100 mg, 100 mg, Oral, QHS, Agbata, Tochukwu, MD, 100 mg at 10/16/20 2201 .  umeclidinium-vilanterol (ANORO ELLIPTA) 62.5-25 MCG/INH 1 puff, 1 puff,  Inhalation, Daily, Agbata, Tochukwu, MD, 1 puff at 10/17/20 0821   Physical exam:  Vitals:   10/17/20 0431 10/17/20 0815 10/17/20 1133 10/17/20 1600  BP: (!) 146/72 (!) 139/55 134/62 (!) 142/58  Pulse: 90 89 94 88  Resp: 18 16 20 20   Temp: 98 F (36.7 C) (!) 97.5 F (36.4 C) 97.7 F (36.5 C) 97.9 F (36.6 C)  TempSrc: Oral Oral Oral Oral  SpO2: 99% 99% 98% 96%  Weight:      Height:       Physical Exam Constitutional:      General: She is not in acute distress.    Appearance: She is not diaphoretic.  HENT:     Head: Normocephalic and atraumatic.     Nose: Nose normal.     Mouth/Throat:     Pharynx: No oropharyngeal exudate.  Eyes:     General: No scleral icterus.    Pupils: Pupils are equal, round, and reactive to light.  Cardiovascular:     Rate and Rhythm: Normal rate and regular rhythm.     Heart sounds: No murmur heard.   Pulmonary:     Effort: Pulmonary effort is normal. No respiratory distress.     Breath sounds: No rales.  Chest:     Chest wall: No tenderness.  Abdominal:     General: There is no distension.     Palpations: Abdomen is soft.     Tenderness: There is no abdominal tenderness.  Musculoskeletal:        General: Normal range of motion.     Cervical back: Normal range of motion and neck supple.  Skin:    General: Skin is warm and dry.     Findings: Bruising present. No  erythema.  Neurological:     Mental Status: She is alert and oriented to person, place, and time.     Cranial Nerves: No cranial nerve deficit.     Motor: No abnormal muscle tone.     Coordination: Coordination normal.  Psychiatric:        Mood and Affect: Affect normal.        CMP Latest Ref Rng & Units 10/17/2020  Glucose 70 - 99 mg/dL 115(H)  BUN 8 - 23 mg/dL 34(H)  Creatinine 0.44 - 1.00 mg/dL 1.23(H)  Sodium 135 - 145 mmol/L 137  Potassium 3.5 - 5.1 mmol/L 4.7  Chloride 98 - 111 mmol/L 101  CO2 22 - 32 mmol/L 27  Calcium 8.9 - 10.3 mg/dL 9.2  Total Protein 6.5 - 8.1 g/dL 5.5(L)  Total Bilirubin 0.3 - 1.2 mg/dL 1.4(H)  Alkaline Phos 38 - 126 U/L 68  AST 15 - 41 U/L 22  ALT 0 - 44 U/L 14   CBC Latest Ref Rng & Units 10/17/2020  WBC 4.0 - 10.5 K/uL 11.3(H)  Hemoglobin 12.0 - 15.0 g/dL 8.7(L)  Hematocrit 36 - 46 % 27.1(L)  Platelets 150 - 400 K/uL 334    RADIOGRAPHIC STUDIES: I have personally reviewed the radiological images as listed and agreed with the findings in the report. DG Chest 2 View  Result Date: 09/28/2020 CLINICAL DATA:  79 year old female with possible foot infection. EXAM: CHEST - 2 VIEW COMPARISON:  Chest radiograph dated 06/22/2020. FINDINGS: There is mild cardiomegaly with mild vascular congestion. Small left pleural effusion and left lung base atelectasis or infiltrate. Trace right pleural effusion may be present. No pneumothorax. Osteopenia with degenerative changes of the spine. No acute osseous pathology. IMPRESSION: 1. Cardiomegaly with mild vascular congestion.  2. Small left pleural effusion and left lung base atelectasis or infiltrate. Electronically Signed   By: Anner Crete M.D.   On: 09/28/2020 16:07   DG Shoulder Right  Result Date: 10/14/2020 CLINICAL DATA:  [place], bruising, glenoid abnormality on CT EXAM: RIGHT SHOULDER - 2+ VIEW COMPARISON:  10/14/2020 FINDINGS: Internal rotation, external rotation, and transscapular views of the right  shoulder are obtained. There are no acute displaced fractures. Severe right shoulder osteoarthritis is seen with glenohumeral and acromioclavicular joint hypertrophic changes. Veiling opacity at the right lung base consistent with known pleural effusion. IMPRESSION: 1. No acute displaced fracture. 2. Severe right shoulder osteoarthritis. 3. Right pleural effusion. Electronically Signed   By: Randa Ngo M.D.   On: 10/14/2020 18:55   US RENAL  Result Date: 10/04/2020 CLINICAL DATA:  ATN EXAM: RENAL / URINARY TRACT ULTRASOUND COMPLETE COMPARISON:  CT 01/16/2020, ultrasound 10/29/2012 FINDINGS: Right Kidney: Renal measurements: 11.2 x 6.1 x 6.3 cm = volume: 224 mL. Echogenicity within normal limits. No mass or hydronephrosis visualized. Left Kidney: Renal measurements: 10.7 x 7.1 x 6.3 cm = volume: 215 mL. Left kidney is poorly visible, particularly the lower pole. Cyst at the upper pole measuring 3.1 x 3.3 x 3.6 cm. No gross hydronephrosis. Bladder: Appears normal for degree of bladder distention. Other: Cirrhotic morphology of the liver which appears echogenic and nodular. There is a small amount of ascites adjacent to the liver. There is a small left-sided pleural effusion. IMPRESSION: 1. Normal ultrasound appearance of the right kidney 2. Limited visibility of left kidney secondary to bowel gas and positioning. Simple appearing cyst upper pole left kidney. No hydronephrosis. 3. Cirrhotic morphology of the liver with small amount of ascites in the right upper quadrant. Left-sided pleural effusion. Electronically Signed   By: Donavan Foil M.D.   On: 10/04/2020 15:47   US Venous Img Lower Bilateral  Result Date: 09/28/2020 CLINICAL DATA:  79 year old female with bilateral lower extremity edema. EXAM: Bilateral LOWER EXTREMITY VENOUS DOPPLER ULTRASOUND TECHNIQUE: Gray-scale sonography with compression, as well as color and duplex ultrasound, were performed to evaluate the deep venous system(s) from the  level of the common femoral vein through the popliteal and proximal calf veins. COMPARISON:  None. FINDINGS: VENOUS Normal compressibility of the common femoral, superficial femoral, and popliteal veins, as well as the visualized calf veins. Visualized portions of profunda femoral vein and great saphenous vein unremarkable. No filling defects to suggest DVT on grayscale or color Doppler imaging. Doppler waveforms show normal direction of venous flow, normal respiratory plasticity and response to augmentation. Limited views of the contralateral common femoral vein are unremarkable. OTHER None. Limitations: There is mild diffuse subcutaneous edema. No fluid collection. IMPRESSION: Negative. Electronically Signed   By: Anner Crete M.D.   On: 09/28/2020 18:40   CT CHEST ABDOMEN PELVIS WO CONTRAST  Result Date: 10/14/2020 CLINICAL DATA:  Fall, low hemoglobin and hematocrit, bruising in the right upper back and right shoulder pain. History of fallopian tube malignancy noted during hysterectomy. History of CHF, COPD, CKD EXAM: CT CHEST, ABDOMEN AND PELVIS WITHOUT CONTRAST TECHNIQUE: Multidetector CT imaging of the chest, abdomen and pelvis was performed following the standard protocol without IV contrast. COMPARISON:  CT 01/16/2020, 11/16/2012, renal ultrasound 10/04/2020 FINDINGS: CT CHEST FINDINGS Cardiovascular: Limited assessment of the vasculature in the absence of contrast media. Cardiomegaly with predominantly right atrial enlargement. Hypoattenuation of cardiac blood pool compatible with anemia. Three-vessel coronary artery atherosclerosis. Trace pericardial effusion. Atherosclerotic plaque within the normal caliber aorta.  Shared origin of the brachiocephalic and left common carotid arteries. Calcification of the proximal great vessels as well. Central pulmonary arteries are normal caliber. Luminal assessment precluded. No major venous abnormalities are seen. Mediastinum/Nodes: No mediastinal fluid or gas.  Normal thyroid gland and thoracic inlet. No acute abnormality of the trachea or esophagus. No worrisome mediastinal or axillary adenopathy. Hilar nodal evaluation is limited in the absence of intravenous contrast media. Lungs/Pleura: Low-attenuation bilateral pleural effusions. Adjacent areas of passive atelectasis. Mild airways thickening. Some additional patchy opacities are present in the partially atelectatic left lower lobe, underlying aspiration or airspace disease not excluded. No pneumothorax. Background of centrilobular predominant emphysematous changes. Clustered micro nodules are seen in the anterior segment left upper lobe and lingula, possibly infectious or inflammatory. No concerning pulmonary nodules or masses. Musculoskeletal: Soft tissue swelling and contusive changes along the right lateral chest wall. There is a lobular hyperdense collection along the right chest wall concerning for an actively bleeding hematoma in the setting of this noncontrast CT. This measures up to 3.9 cm in maximal thickness extending 14.5 cm in AP diameter and 17.5 cm in craniocaudal extent. Majority of this collection appears subjacent to the serve radius though there is loss of discernible fat plane suggesting a possible intramuscular source. Portions of the shoulders are collimated from view. There is questionable cortical step-off along the posterior aspect of the glenoid in the right shoulder seen on the first images of the sequences (2/1), and a glenoid injury is not fully excluded. Consider dedicated imaging. Additionally, sagittal reconstruction there is a mild cortical step-off along the superior aspect of the manubrium albeit without adjacent soft tissue swelling thickening. Possibly related to step artifact though a minimally displaced fracture is certainly possible. Correlate point tenderness. No visible displaced rib fractures. Several remote appearing deformities of the left anterolateral lower thoracic ribs,  several seen on comparison from March 2021. Multilevel degenerative changes are present in the imaged portions of the spine. Multilevel flowing anterior osteophytosis, compatible with features of diffuse idiopathic skeletal hyperostosis (DISH). Intraosseous hemangioma noted at T12. Additional degenerative changes in the shoulders. CT ABDOMEN PELVIS FINDINGS Hepatobiliary: Possible hypoattenuating lesion abutting the gallbladder fossa (2/66) measuring approximately 1.8 cm in size, incompletely characterized on this unenhanced CT with diminished imaging quality due to motion artifact. Hypertrophy of the left lobe liver with a nodular liver surface contour suggestive of cirrhotic changes. Normal liver attenuation. Normal gallbladder distention. No visible calcified gallstones. Stable surgical clips near the gallbladder fundus. Pericholecystic fluid may reflect redistributed ascites. No biliary ductal dilatation. Pancreas: Partial fatty replacement of the pancreas. No pancreatic ductal dilatation or surrounding inflammatory changes. Spleen: Splenomegaly. No concerning focal splenic lesions. Capsular calcification along the posterior spleen (2/64) stable from prior may reflect sequela of prior injury. Adrenals/Urinary Tract: Macroscopic fat containing lesion in the medial limb of the left adrenal gland suggestive of myelolipoma while a more intermediate to low-attenuation lesion in the body could reflect small adrenal adenoma. Overall stable appearance from prior accounting for motion artifact and differences in technique. No concerning right adrenal lesion. Redemonstration of a 4.5 cm fluid attenuation cyst in the upper pole left kidney, not significantly changed from prior. Stable mild bilateral perinephric stranding is nonspecific can be seen with advanced age or diminished renal function. No new concerning focal renal lesion. No urolithiasis or hydronephrosis. Urinary bladder is unremarkable for the degree of  distention. Stomach/Bowel: Distal esophagus, stomach and duodenal sweep are unremarkable. No small bowel wall thickening or dilatation. No evidence  of obstruction. Appendix is not visualized. No focal inflammation the vicinity of the cecum to suggest an occult appendicitis. Cecum displaced to the midline pelvis without frank volvulus. Moderate colonic stool burden. No colonic dilatation or wall thickening. Scattered colonic diverticula without focal inflammation to suggest diverticulitis. Vascular/Lymphatic: No worrisome abdominopelvic adenopathy. Multiple surgical clips in the retroperitoneum likely reflecting prior nodal dissection. Extensive atherosclerotic calcification is similar to prior. Luminal evaluation severely limited in the absence of contrast media. Reproductive: Uterus is surgically absent. No concerning adnexal lesions. No concerning adnexal lesions are identified. Other: Extensive circumferential body wall edema. More focal soft tissue thickening of the right chest wall and flank may reflect some additional contusive change in the setting of recent fall. No traumatic abdominal wall dehiscence. No bowel containing hernias. Postsurgical changes from low vertical midline incision. Musculoskeletal: No acute fracture or traumatic osseous injury of the lumbar spine or bony pelvis. Proximal femora are intact and normally located. Unchanged grade 1 anterolisthesis L4 on 5. Multilevel degenerative changes are present in the imaged portions of the spine. Additional degenerative changes throughout the hips and pelvis. No worrisome osseous lesions. IMPRESSION: Traumatic Findings: 1. Contusive changes along the right posterolateral chest wall and flank and lateral right breast soft tissues. Larger heterogeneous, hyperdense chest wall hematoma which appears subjacent to or possibly arising from the serrated is musculature. Concerning for acute hemorrhage in the setting of declining hemoglobin. No visible  displaced rib fractures though bony demineralization may detection of subtle nondisplaced fracture. 2. Possible fracture along the posterior right glenoid, incompletely imaged on this exam. Consider dedicated right shoulder imaging. 3. Additional questionable cortical step-off of the superior manubrium albeit without adjacent soft tissue thickening or stranding. Correlate for point tenderness to assess for nondisplaced fracture. 4. Unenhanced CT was performed per clinician order. Lack of IV contrast limits sensitivity and specificity, especially for evaluation of abdominal/pelvic solid viscera. 5. Ill-defined questionable hypoattenuating 1.8 cm lesion adjacent the gallbladder fossa. A rounded configuration is less suggestive of a acute traumatic liver lesion though not fully excluded. Regardless, at minimum further assessment with outpatient MRI should be considered given underlying intrinsic liver disease/cirrhosis. Nontraumatic Findings: 1. Features of volume overload/anasarca with diffuse body wall edema, bilateral pleural effusions, ascites. Possibly related to a chronic history of CHF or underlying cirrhosis. 2. Passive atelectatic changes adjacent the pleural effusions with some more patchy opacity in the left lung base and micro nodularity in the left upper lobe and lingula which could reflect a superimposed infectious or inflammatory process. 3. Stigmata of cirrhosis include a nodular liver, left lobe hypertrophy, and splenomegaly. 4. 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 5. Mild bilateral perinephric stranding, can reflect diminished renal function or senescent change though could correlate for urinary symptoms and consider urinalysis as appropriate. 6. Postsurgical changes from prior retroperitoneal nodal dissection. 7. Prior hysterectomy. 8. Cardiomegaly, three-vessel coronary artery atherosclerosis. 9.   Aortic Atherosclerosis (ICD10-I70.0). 10.  Emphysema (ICD10-J43.9). These results were called by telephone at the time of interpretation on 10/14/2020 at 5:44 pm to provider [facility] , who verbally acknowledged these results. Electronically Signed   By: Lovena Le M.D.   On: 10/14/2020 17:47    Assessment and plan-   #Right chest wall hematoma/soft tissue injury secondary to injury from her fall Continue pain control.  #Acute on chronic anemia, likely secondary to blood loss due to hematoma. Increased reticulocyte 5% indicate go next od bone  marrow response. Normal LDH. Negative Coombs testing. Less likely hemolysis. Today's hemoglobin has improved to 8.7. Iron saturation 6% and TIBC 340. Avoid aggressive iron repletion due to diagnosis of PV.  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 Given that her hemoglobin is improving, it is reasonable to continue hydroxyurea. There may be a component of ovarian suppression from hydroxyurea so if hemoglobin further drops, I will have a low threshold of holding hydroxyurea until she recovers from current acute events.  #Radiographic evidence of cirrhosis-with nodular liver contour, left lobe hypertrophy, splenomegaly..-Ascites, pleural effusion, No significant EtOH use. Hepatitis negative. Patient will need a comprehensive gastroenterology work-up for cirrhosis. Increased INR. This can be secondary to Eliquis use or Underlying coagulopathy from cirrhosis. #Patient was on Eliquis for atrial fibrillation. Currently Eliquis is on hold.  #Newly defined questionable hypoattenuated 1.8 cm lesion adjacent to gallbladder fossa. Patient will need further MRI abdomen for work-up.  Thank you for allowing me to participate in the care of this patient.   Earlie Server, MD, PhD Hematology Oncology [facility] at [facility] Pager- [phone] 10/17/2020

## 2020-10-17 NOTE — Progress Notes (Addendum)
PROGRESS NOTE    Angelica Juarez  YQM:[id number]  DOB: 11-24-1940  DOA: 10/14/2020 PCP: Sofie Hartigan, MD Outpatient Specialists:   Hospital course:  79 year old female with PCV, A. fib on anticoagulation, HFpEF, HTN, COPD, CKD 3 was admitted 10/14/2020 with symptomatic anemia.  Hemoglobin was noted to be 6.9 down from baseline of 10.  Blood pressure was low normal but she was tachycardic.  Work-up revealed massive right-sided bruising.  CT revealed no retroperitoneal hematoma but she did have a chest wall hematoma.  It also revealed a nodular liver consistent with cirrhosis.  Echocardiogram done here shows normal EF with normal diastolic parameters thus previous edema most likely secondary to previously undiagnosed cirrhosis rather than HFpEF as has previously been thought.   Subjective:  Patient thinks she might be a little better but is not sure.  Notes she has a little bit more energy.  Still feeling weak however.  Does not feel like she is safe to go home.  Would like to go back to the SNF from which she came.  Denies shortness of breath, notes pain is somewhat better than it has been.  Patient is quite concerned about the diagnosis of cirrhosis.  She wonders why it has been missed for so long.  She states she does have a gastroenterologist as an outpatient and is willing to follow-up with her is pleased to hear that her echocardiogram came out well.  Objective: Vitals:   10/16/20 2308 10/17/20 0431 10/17/20 0815 10/17/20 1133  BP: (!) 143/46 (!) 146/72 (!) 139/55 134/62  Pulse: 89 90 89 94  Resp: 18 18 16 20   Temp: 97.7 F (36.5 C) 98 F (36.7 C) (!) 97.5 F (36.4 C) 97.7 F (36.5 C)  TempSrc: Oral Oral Oral Oral  SpO2: 98% 99% 99% 98%  Weight:      Height:        Intake/Output Summary (Last 24 hours) at 10/17/2020 1523 Last data filed at 10/17/2020 1500 Gross per 24 hour  Intake 483 ml  Output 1000 ml  Net -517 ml   Filed Weights   10/14/20 1321  Weight: 104.6  kg     Exam:  General: Patient sitting up on side of bed dangling her legs.  She appears to have more energy and appears brighter. Eyes: sclera anicteric, conjuctiva mild injection bilaterally CVS: S1-S2, irregular  Respiratory: Patient has significant bruising along her entire right side of chest down to her abdomen including her breasts.  Decreased air entry bilaterally secondary to decreased inspiratory effort, rales at bases  GI: NABS, soft, NT bruising is noted above LE: 2+ edema bilaterally that does not appear improved from yesterday.  Erythema on left lower forefoot  surrounding a shallow ulcer is without change. Neuro: A/O x 3, Moving all extremities equally with normal strength, CN 3-12 intact, grossly nonfocal.  Psych: patient is logical and coherent, judgement and insight appear normal, mood and affect appropriate to situation.   Assessment & Plan:   Acute blood loss anemia secondary to large chest wall hematoma and known microscopic colitis H&H have now stabilized off of the Eliquis and aspirin. Will discuss with hematology when it would be safe to restart Eliquis and aspirin now that H&H have stabilized. The 18 cm x 15 cm right chest wall hematoma thought to be bleeding as seen on CT is likely cause of acute blood loss anemia. However patient also has microscopic colitis seen on colonoscopy July 2021, has not had any further stool  over the past day, stool guaiacs have been ordered. Received 2 units irradiated PRBC 10/15/2020.   Patient was seen by hematology who recommended keeping hemoglobin greater than 8.  Cirrhosis Patient noted to have a nodular liver suggestive of cirrhosis Previous work-up including hepatitis B surface antigen and hepatitis C are negative. Prior edema had been thought to be secondary to HFpEF although new echocardiogram now shows normal EF with normal diastolic parameters. Patient would like to follow-up with her gastroenterologist Dr. Dicie Beam as an  outpatient for further treatment of her cirrhosis, so inpatient GI consult was not warranted. Can add spironolactone upon discharge if tolerated by potassium after diuresis is completed.  Volume overload Will change Lasix p.o. to Lasix 40 IV  Follow I's and O's and daily weights She can be changed back to oral Lasix upon discharge  Incidentally noted 1.8 cm lesion at gallbladder fossa Will need an outpatient MRI for follow-up as recommended  Patchy opacity in left lung base Possibly suggestive of infection or inflammatory Patient is afebrile with no new cough WBC is 11, stable and improved from prior admission Will follow with low threshold for antibiotics should she develop anything suggestive of clinical pneumonia.  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 Patient's not have bowel movement in the past 24 hours.  Abnormal CT with possible fracture of glenoid and manubrium Dedicated shoulder x-ray ruled out fracture but shows severe arthritis of right shoulder No step-off noted on manubrium  Polycythemia vera Continue hydroxyurea Appreciate heme-onc consult  Patient has a diagnosis of HFpEF However echo done 06/12/2020 showed EF of 50 to 55% with normal diastolic parameters  Pulmonary hypertension chronic hypoxic respiratory failure Patient was noted to have a PA pressure of 73 mmHg with moderately dilated RA. This can be followed up as an outpatient with cardiology Continue home O2 2 L [place]  Atrial fibrillation Eliquis is being held due to hematoma and anemia Patient is not on any rate control medications at home  COPD No evidence for acute flare To new inhaled bronchodilators, Anoro Ellipta and Singulair  Redness of left ankle Patient is s/p treatment for cellulitis which grew out Enterobacter and Klebsiella from the wound Both patient and husband note the ankle is better. Skin biopsy to rule out  pemphigus/pemphigoid is negative, perivascular eosinophilic infiltration consistent with allergic reaction, possible drug reaction.   Seizure disorder Continue Keppra 500 twice daily   DVT prophylaxis: SCD Code Status: Full Family Communication: Spoke with patient and husband at bedside Disposition Plan:   Patient is from: Home  Anticipated Discharge Location: Home  Barriers to Discharge: Possible ongoing bleeding and acute anemia  Is patient medically stable for Discharge: No   Consultants:  Hematology oncology  Procedures:  None  Antimicrobials:  None   Data Reviewed:  Basic Metabolic Panel: Recent Labs  Lab 10/14/20 0932 10/14/20 1330 10/15/20 0420 10/16/20 0337 10/17/20 0414  NA 135 138 139 139 137  K 5.3* 5.6* 4.7 4.7 4.7  CL 99 103 105 101 101  CO2 27 27 25 27 27   GLUCOSE 115* 99 104* 121* 115*  BUN 33* 32* 30* 34* 34*  CREATININE 1.88* 1.82* 1.52* 1.50* 1.23*  CALCIUM 9.1 9.5 8.7* 9.4 9.2  MG  --   --   --  1.9  --   PHOS  --   --   --  4.0  --    Liver Function Tests: Recent Labs  Lab 10/14/20 0932 10/14/20 1330 10/17/20 0414  AST 24 25 22   ALT 17 18 14   ALKPHOS 63 62 68  BILITOT 0.9 1.1 1.4*  PROT 5.8* 6.1* 5.5*  ALBUMIN 3.0* 3.2* 3.0*   No results for input(s): LIPASE, AMYLASE in the last 168 hours. No results for input(s): AMMONIA in the last 168 hours. CBC: Recent Labs  Lab 10/14/20 0932 10/14/20 0932 10/14/20 1330 10/14/20 1330 10/15/20 0420 10/16/20 0337 10/16/20 1602 10/16/20 2315 10/17/20 0414  WBC 13.8*  --  13.5*  --  11.1* 11.1*  --   --  11.3*  NEUTROABS 11.8*  --  11.6*  --   --   --   --   --   --   HGB 6.9*   < > 7.1*   < > 7.2* 8.9* 8.5* 8.5* 8.7*  HCT 22.3*   < > 23.0*   < > 23.1* 28.1* 27.2* 27.4* 27.1*  MCV 93.3  --  93.9  --  94.3 92.7  --   --  91.6  PLT 367  --  384  --  305 358  --   --  334   < > = values in this interval not displayed.   Cardiac Enzymes: No results for input(s): CKTOTAL, CKMB,  CKMBINDEX, TROPONINI in the last 168 hours. BNP (last 3 results) No results for input(s): PROBNP in the last 8760 hours. CBG: Recent Labs  Lab 10/16/20 1132 10/16/20 1633 10/16/20 2203 10/17/20 0759 10/17/20 1135  GLUCAP 119* 104* 115* 101* 113*    Recent Results (from the past 240 hour(s))  Resp Panel by RT-PCR (Flu A&B, Covid) Nasopharyngeal Swab     Status: None   Collection Time: 10/14/20  8:32 PM   Specimen: Nasopharyngeal Swab; Nasopharyngeal(NP) swabs in vial transport medium  Result Value Ref Range Status   SARS Coronavirus 2 by RT PCR NEGATIVE NEGATIVE Final    Comment: (NOTE) SARS-CoV-2 target nucleic acids are NOT DETECTED.  The SARS-CoV-2 RNA is generally detectable in upper respiratory specimens during the acute phase of infection. The lowest concentration of SARS-CoV-2 viral copies this assay can detect is 138 copies/mL. A negative result does not preclude SARS-Cov-2 infection and should not be used as the sole basis for treatment or other patient management decisions. A negative result may occur with  improper specimen collection/handling, submission of specimen other than nasopharyngeal swab, presence of viral mutation(s) within the areas targeted by this assay, and inadequate number of viral copies(<138 copies/mL). A negative result must be combined with clinical observations, patient history, and epidemiological information. The expected result is Negative.  Fact Sheet for Patients:  EntrepreneurPulse.com.au  Fact Sheet for Healthcare Providers:  IncredibleEmployment.be  This test is no t yet approved or cleared by the [place] FDA and  has been authorized for detection and/or diagnosis of SARS-CoV-2 by FDA under an Emergency Use Authorization (EUA). This EUA will remain  in effect (meaning this test can be used) for the duration of the COVID-19 declaration under Section 564(b)(1) of the Act, 21 U.S.C.section  360bbb-3(b)(1), unless the authorization is terminated  or revoked sooner.       Influenza A by PCR NEGATIVE NEGATIVE Final   Influenza B by PCR NEGATIVE NEGATIVE Final    Comment: (NOTE) The Xpert Xpress SARS-CoV-2/FLU/RSV plus assay is intended as an aid in the diagnosis of influenza from Nasopharyngeal swab specimens and should not be used as a sole basis for treatment. Nasal washings and aspirates are unacceptable for  Xpert Xpress SARS-CoV-2/FLU/RSV testing.  Fact Sheet for Patients: EntrepreneurPulse.com.au  Fact Sheet for Healthcare Providers: IncredibleEmployment.be  This test is not yet approved or cleared by the [place] FDA and has been authorized for detection and/or diagnosis of SARS-CoV-2 by FDA under an Emergency Use Authorization (EUA). This EUA will remain in effect (meaning this test can be used) for the duration of the COVID-19 declaration under Section 564(b)(1) of the Act, 21 U.S.C. section 360bbb-3(b)(1), unless the authorization is terminated or revoked.  Performed at [facility], [address] [place], [place] [address]       Studies: No results found.   Scheduled Meds: . atorvastatin  40 mg Oral Daily  . diphenhydrAMINE  25 mg Oral Daily  . fluticasone  2 spray Each Nare Daily  . furosemide  40 mg Oral Daily  . [START ON 10/19/2020] hydroxyurea  500 mg Oral 2 times per day on Mon   And  . hydroxyurea  500 mg Oral Once per day on Sun Tue Wed Thu Fri Sat  . insulin aspart  0-15 Units Subcutaneous TID WC  . levETIRAcetam  500 mg Oral BID  . montelukast  10 mg Oral Daily  . multivitamin with minerals  1 tablet Oral Daily  . mupirocin ointment   Topical Daily  . pneumococcal 23 valent vaccine  0.5 mL Intramuscular Once  . sodium chloride flush  3 mL Intravenous Q12H  . traZODone  100 mg Oral QHS  . umeclidinium-vilanterol  1 puff Inhalation Daily   Continuous Infusions: . sodium chloride        Principal Problem:   Acute blood loss anemia Active Problems:   COPD (chronic obstructive pulmonary disease) (HCC)   Chronic kidney disease, stage 3a   Hypertension   OSA on CPAP   Seizure disorder (HCC)   Polycythemia rubra vera ([place])   Other persistent atrial fibrillation (HCC)   Chronic kidney disease   Hyperkalemia   Chest wall hematoma, right, subsequent encounter   Chronic respiratory failure ([place])   Symptomatic anemia     Clois Treanor Tublu Rubert Frediani, [facility]  If 7PM-7AM, please contact night-coverage [email or web address] Password [email or web address] 10/17/2020, 3:23 PM    LOS: 2 days

## 2020-10-18 ENCOUNTER — Inpatient Hospital Stay: Payer: Medicare Other

## 2020-10-18 DIAGNOSIS — S20211A Contusion of right front wall of thorax, initial encounter: Secondary | ICD-10-CM | POA: Diagnosis not present

## 2020-10-18 DIAGNOSIS — D62 Acute posthemorrhagic anemia: Secondary | ICD-10-CM | POA: Diagnosis not present

## 2020-10-18 DIAGNOSIS — K7460 Unspecified cirrhosis of liver: Secondary | ICD-10-CM | POA: Diagnosis not present

## 2020-10-18 DIAGNOSIS — D45 Polycythemia vera: Secondary | ICD-10-CM | POA: Diagnosis not present

## 2020-10-18 LAB — BASIC METABOLIC PANEL
Anion gap: 10 (ref 5–15)
BUN: 30 mg/dL — ABNORMAL HIGH (ref 8–23)
CO2: 28 mmol/L (ref 22–32)
Calcium: 9.3 mg/dL (ref 8.9–10.3)
Chloride: 102 mmol/L (ref 98–111)
Creatinine, Ser: 1.03 mg/dL — ABNORMAL HIGH (ref 0.44–1.00)
GFR, Estimated: 56 mL/min — ABNORMAL LOW (ref >60)
Glucose, Bld: 107 mg/dL — ABNORMAL HIGH (ref 70–99)
Potassium: 4.2 mmol/L (ref 3.5–5.1)
Sodium: 140 mmol/L (ref 135–145)

## 2020-10-18 LAB — CBC
HCT: 28.3 % — ABNORMAL LOW (ref 36.0–46.0)
Hemoglobin: 8.7 g/dL — ABNORMAL LOW (ref 12.0–15.0)
MCH: 29.1 pg (ref 26.0–34.0)
MCHC: 30.7 g/dL (ref 30.0–36.0)
MCV: 94.6 fL (ref 80.0–100.0)
Platelets: 327 10*3/uL (ref 150–400)
RBC: 2.99 MIL/uL — ABNORMAL LOW (ref 3.87–5.11)
RDW: 19.7 % — ABNORMAL HIGH (ref 11.5–15.5)
WBC: 10.9 10*3/uL — ABNORMAL HIGH (ref 4.0–10.5)
nRBC: 0 % (ref 0.0–0.2)

## 2020-10-18 LAB — GLUCOSE, CAPILLARY
Glucose-Capillary: 112 mg/dL — ABNORMAL HIGH (ref 70–99)
Glucose-Capillary: 129 mg/dL — ABNORMAL HIGH (ref 70–99)
Glucose-Capillary: 107 mg/dL — ABNORMAL HIGH (ref 70–99)
Glucose-Capillary: 112 mg/dL — ABNORMAL HIGH (ref 70–99)

## 2020-10-18 LAB — APTT: aPTT: 34 seconds (ref 24–36)

## 2020-10-18 LAB — PROTIME-INR
INR: 1.2 (ref 0.8–1.2)
Prothrombin Time: 14.6 seconds (ref 11.4–15.2)

## 2020-10-18 NOTE — Progress Notes (Signed)
Hematology/Oncology Progress Note [facility] Telephone:([phone] [phone]  Patient Care Team: Sofie Hartigan, MD as PCP - General (Family Medicine) Allyne Gee, MD as Consulting Physician (Pulmonary Disease) Anthonette Legato, MD as Consulting Physician (Nephrology)   Name of the patient: Angelica Juarez  [id number]  [birth year]  Date of visit: 10/18/20   INTERVAL HISTORY-   Patient feel well today. Right side pain is controlled.   Review of systems- Review of Systems  Constitutional: Positive for fatigue. Negative for appetite change, chills and fever.  HENT:   Negative for hearing loss and voice change.   Eyes: Negative for eye problems.  Respiratory: Negative for chest tightness, cough and shortness of breath.   Cardiovascular: Negative for chest pain.  Gastrointestinal: Negative for abdominal distention, abdominal pain and blood in stool.  Endocrine: Negative for hot flashes.  Genitourinary: Negative for difficulty urinating and frequency.   Musculoskeletal: Negative for arthralgias.  Skin: Negative for itching and rash.       Right side bruising  Neurological: Negative for extremity weakness.  Hematological: Negative for adenopathy.  Psychiatric/Behavioral: Negative for confusion.    Allergies  Allergen Reactions  . Iodinated Diagnostic Agents Anaphylaxis    Other reaction(s): Other (See Comments) Throat swells and extreme hives  . Lamictal [Lamotrigine] Rash    Suspected drug rash 09/2020  . Latex Itching  . Phenobarbital Hives  . Tape Rash    silicones    Patient Active Problem List   Diagnosis Date Noted  . Trauma   . Cirrhosis of liver with ascites ([place])   . Symptomatic anemia 10/15/2020  . Macrocytic anemia 10/14/2020  . Acute blood loss anemia 10/14/2020  . Chronic respiratory failure ([place]) 10/14/2020  . Chronic kidney disease   . Hyperkalemia   . Hematoma of right chest wall   . Acute on chronic diastolic CHF  (congestive heart failure) ([place]) 09/29/2020  . Cellulitis of left leg 09/28/2020  . Rash and nonspecific skin eruption 09/28/2020  . Left leg cellulitis 09/28/2020  . Normocytic anemia 06/11/2020  . COPD with acute exacerbation ([place]) 06/11/2020  . Chronic diarrhea of unknown origin   . Acute left flank pain 01/15/2020  . Hypokalemia 11/14/2019  . Proteinuria 08/09/2019  . Acute on chronic congestive heart failure ([place]) 07/09/2019  . Nonrheumatic mitral valve regurgitation 07/08/2019  . Other persistent atrial fibrillation ([place]) 07/08/2019  . C. difficile diarrhea 11/24/2018  . Diarrhea 11/18/2018  . Nausea without vomiting 11/18/2018  . Goals of care, counseling/discussion 09/12/2018  . 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 History of repair of right rotator cuff 08/25/2016  . Acute respiratory failure with hypoxia ([place]) 08/06/2016  . Nontraumatic tear of right rotator cuff 04/21/2016  . Atypical chest pain 04/07/2016  . Impingement syndrome of right shoulder 12/29/2015  . Primary osteoarthritis of first carpometacarpal joint of left hand 10/27/2015  . Right shoulder pain 10/27/2015  . OSA on CPAP 10/14/2015  . COPD (chronic obstructive pulmonary disease) ([place]) 07/13/2015  . Type 2 diabetes mellitus with microalbuminuria, without long-term current use of insulin ([place]) 07/13/2015  . Hypertension 03/05/2015  . Difficulty sleeping 11/26/2014  . Chronic kidney disease, stage 3a 07/31/2014  . Seizure disorder ([place]) 07/31/2014  . History of colonic polyps 05/21/2014  . Hyperlipidemia, unspecified 04/30/2014     Past Medical History:  Diagnosis Date  . Allergy    Seasonal  . Arthritis   . 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 Chronic  kidney disease    chronic renal insufficiency  . Colon polyps 05/21/14  . COPD (chronic obstructive pulmonary disease) ([place])   . Diabetes mellitus without complication ([place])   . Dyspnea   . Fracture closed, humerus, shaft    right    . History of kidney stones    40 years ago  . Hyperlipidemia 04/30/14  . Hypertension   . Impingement syndrome of right shoulder 12/29/15  . Personal history of radiation therapy   . Pneumonia    hx  . Rotator cuff tear 04/21/16   right  . Seizures ([place])   . Sleep apnea      Past Surgical History:  Procedure Laterality Date  . ABDOMINAL HYSTERECTOMY    . COLONOSCOPY WITH PROPOFOL N/A 06/01/2020   Procedure: COLONOSCOPY WITH PROPOFOL;  Surgeon: Lin Landsman, MD;  Location: [facility] ENDOSCOPY;  Service: Gastroenterology;  Laterality: N/A;  . ESOPHAGOGASTRODUODENOSCOPY (EGD) WITH PROPOFOL N/A 06/01/2020   Procedure: ESOPHAGOGASTRODUODENOSCOPY (EGD) WITH PROPOFOL;  Surgeon: Lin Landsman, MD;  Location: [facility] ENDOSCOPY;  Service: Gastroenterology;  Laterality: N/A;  . 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 EYE SURGERY     bilateral cataracts  . JOINT REPLACEMENT     bilateral knee replacement  . NASAL SEPTUM SURGERY    . OOPHORECTOMY    . REPLACEMENT TOTAL KNEE Bilateral 2004  . SHOULDER ARTHROSCOPY WITH BICEPSTENOTOMY Right 05/25/2016   Procedure: SHOULDER ARTHROSCOPY WITH BICEPSTENOTOMY;  Surgeon: Leanor Kail, MD;  Location: [facility] ORS;  Service: Orthopedics;  Laterality: Right;  . SHOULDER ARTHROSCOPY WITH DISTAL CLAVICLE RESECTION Right 05/25/2016   Procedure: SHOULDER ARTHROSCOPY WITH DISTAL CLAVICLE RESECTION;  Surgeon: Leanor Kail, MD;  Location: [facility] ORS;  Service: Orthopedics;  Laterality: Right;  . SHOULDER ARTHROSCOPY WITH OPEN ROTATOR CUFF REPAIR Right 05/25/2016   Procedure: SHOULDER ARTHROSCOPY WITH OPEN ROTATOR CUFF REPAIR;  Surgeon: Leanor Kail, MD;  Location: [facility] ORS;  Service: Orthopedics;  Laterality: Right;  . SUBACROMIAL DECOMPRESSION Right 05/25/2016   Procedure: SUBACROMIAL DECOMPRESSION;  Surgeon: Leanor Kail, MD;  Location: [facility] ORS;  Service: Orthopedics;  Laterality: Right;    Social History   Socioeconomic History  .  Marital status: Single    Spouse name: Not on file  . Number of children: Not on file  . Years of education: Not on file  . Highest education level: Not on file  Occupational History  . Not on file  Tobacco Use  . Smoking status: Former Smoker    Packs/day: 1.00    Years: 40.00    Pack years: 40.00    Quit date: 05/12/2005    Years since quitting: 15.4  . Smokeless tobacco: Never Used  Vaping Use  . Vaping Use: Never used  Substance and Sexual Activity  . Alcohol use: No  . Drug use: No  . Sexual activity: Not on file  Other Topics Concern  . Not on file  Social History Narrative  . Not on file   Social Determinants of Health   Financial Resource Strain:   . Difficulty of Paying Living Expenses: Not on file  Food Insecurity:   . Worried About Charity fundraiser in the Last Year: Not on file  . Ran Out of Food in the Last Year: Not on file  Transportation Needs:   . Lack of Transportation (Medical): Not on file  . Lack of Transportation (Non-Medical): Not on file  Physical Activity:   . Days of Exercise per Week: Not on  file  . Minutes of Exercise per Session: Not on file  Stress:   . Feeling of Stress : Not on file  Social Connections:   . Frequency of Communication with Friends and Family: Not on file  . Frequency of Social Gatherings with Friends and Family: Not on file  . Attends Religious Services: Not on file  . Active Member of Clubs or Organizations: Not on file  . Attends Archivist Meetings: Not on file  . Marital Status: Not on file  Intimate Partner Violence:   . Fear of Current or Ex-Partner: Not on file  . Emotionally Abused: Not on file  . Physically Abused: Not on file  . Sexually Abused: Not on file     Family History  Problem Relation Age of Onset  . Breast cancer Paternal Aunt 34  . Diabetes Father   . Heart disease Father   . Breast cancer Daughter 8     Current Facility-Administered Medications:  .  0.9 %  sodium chloride  infusion, 250 mL, Intravenous, PRN, Agbata, Tochukwu, MD .  acetaminophen (TYLENOL) tablet 650 mg, 650 mg, Oral, Q6H PRN, 650 mg at 10/18/20 0356 **OR** acetaminophen (TYLENOL) suppository 650 mg, 650 mg, Rectal, Q6H PRN, Agbata, Tochukwu, MD .  albuterol (PROVENTIL) (2.5 MG/3ML) 0.083% nebulizer solution 1.25 mg, 1.25 mg, Nebulization, Q6H PRN, Agbata, Tochukwu, MD .  atorvastatin (LIPITOR) tablet 40 mg, 40 mg, Oral, Daily, Agbata, Tochukwu, MD, 40 mg at 10/18/20 0842 .  diphenhydrAMINE (BENADRYL) capsule 25 mg, 25 mg, Oral, Daily, Agbata, Tochukwu, MD, 25 mg at 10/18/20 0842 .  fluticasone (FLONASE) 50 MCG/ACT nasal spray 2 spray, 2 spray, Each Nare, Daily, Agbata, Tochukwu, MD, 2 spray at 10/18/20 0841 .  furosemide (LASIX) injection 40 mg, 40 mg, Intravenous, Daily, Bonnell Public Tublu, MD, 40 mg at 10/18/20 0842 .  [START ON 10/19/2020] hydroxyurea (HYDREA) capsule 500 mg, 500 mg, Oral, 2 times per day on Mon **AND** hydroxyurea (HYDREA) capsule 500 mg, 500 mg, Oral, Once per day on Sun Tue Wed Thu Fri Sat, Belue, Nathan S, RPH, 500 mg at 10/18/20 2694 .  insulin aspart (novoLOG) injection 0-15 Units, 0-15 Units, Subcutaneous, TID WC, Agbata, Tochukwu, MD, 2 Units at 10/18/20 1247 .  levETIRAcetam (KEPPRA) tablet 500 mg, 500 mg, Oral, BID, Agbata, Tochukwu, MD, 500 mg at 10/18/20 0842 .  montelukast (SINGULAIR) tablet 10 mg, 10 mg, Oral, Daily, Agbata, Tochukwu, MD, 10 mg at 10/18/20 0842 .  multivitamin with minerals tablet 1 tablet, 1 tablet, Oral, Daily, Agbata, Tochukwu, MD, 1 tablet at 10/18/20 0842 .  mupirocin ointment (BACTROBAN) 2 %, , Topical, Daily, Vashti Hey, MD, Given at 10/17/20 [phone] .  ondansetron (ZOFRAN) tablet 4 mg, 4 mg, Oral, Q6H PRN **OR** ondansetron (ZOFRAN) injection 4 mg, 4 mg, Intravenous, Q6H PRN, Agbata, Tochukwu, MD .  pneumococcal 23 valent vaccine (PNEUMOVAX-23) injection 0.5 mL, 0.5 mL, Intramuscular, Once, Jamse Arn, Dewaine Oats Tublu, MD .   sodium chloride flush (NS) 0.9 % injection 3 mL, 3 mL, Intravenous, Q12H, Agbata, Tochukwu, MD, 3 mL at 10/17/20 2043 .  sodium chloride flush (NS) 0.9 % injection 3 mL, 3 mL, Intravenous, PRN, Agbata, Tochukwu, MD, 3 mL at 10/18/20 0846 .  traZODone (DESYREL) tablet 100 mg, 100 mg, Oral, QHS, Agbata, Tochukwu, MD, 100 mg at 10/17/20 2043 .  umeclidinium-vilanterol (ANORO ELLIPTA) 62.5-25 MCG/INH 1 puff, 1 puff, Inhalation, Daily, Agbata, Tochukwu, MD, 1 puff at 10/18/20 0841   Physical exam:  Vitals:   10/18/20 0402  10/18/20 0840 10/18/20 1120 10/18/20 1556  BP: (!) 153/71 110/68 118/71 (!) 155/79  Pulse: 97 (!) 54 95 95  Resp: 20 20 20 16   Temp: 98 F (36.7 C) 98.1 F (36.7 C) 97.6 F (36.4 C) 97.8 F (36.6 C)  TempSrc: Oral Oral Oral   SpO2: 92% 94% 99% 94%  Weight: 215 lb 9.8 oz (97.8 kg)     Height:      youtube  Physical Exam Constitutional:      General: She is not in acute distress.    Appearance: She is not diaphoretic.  HENT:     Head: Normocephalic and atraumatic.     Nose: Nose normal.     Mouth/Throat:     Pharynx: No oropharyngeal exudate.  Eyes:     General: No scleral icterus.    Pupils: Pupils are equal, round, and reactive to light.  Cardiovascular:     Rate and Rhythm: Normal rate and regular rhythm.     Heart sounds: No murmur heard.   Pulmonary:     Effort: Pulmonary effort is normal. No respiratory distress.     Breath sounds: No rales.  Chest:     Chest wall: No tenderness.  Abdominal:     General: There is no distension.     Palpations: Abdomen is soft.     Tenderness: There is no abdominal tenderness.  Musculoskeletal:        General: Normal range of motion.     Cervical back: Normal range of motion and neck supple.  Skin:    General: Skin is warm and dry.     Findings: Bruising present. No erythema.  Neurological:     Mental Status: She is alert and oriented to person, place, and time.     Cranial Nerves: No cranial nerve deficit.      Motor: No abnormal muscle tone.     Coordination: Coordination normal.  Psychiatric:        Mood and Affect: Affect normal.        CMP Latest Ref Rng & Units 10/18/2020  Glucose 70 - 99 mg/dL 107(H)  BUN 8 - 23 mg/dL 30(H)  Creatinine 0.44 - 1.00 mg/dL 1.03(H)  Sodium 135 - 145 mmol/L 140  Potassium 3.5 - 5.1 mmol/L 4.2  Chloride 98 - 111 mmol/L 102  CO2 22 - 32 mmol/L 28  Calcium 8.9 - 10.3 mg/dL 9.3  Total Protein 6.5 - 8.1 g/dL -  Total Bilirubin 0.3 - 1.2 mg/dL -  Alkaline Phos 38 - 126 U/L -  AST 15 - 41 U/L -  ALT 0 - 44 U/L -   CBC Latest Ref Rng & Units 10/18/2020  WBC 4.0 - 10.5 K/uL 10.9(H)  Hemoglobin 12.0 - 15.0 g/dL 8.7(L)  Hematocrit 36 - 46 % 28.3(L)  Platelets 150 - 400 K/uL 327    RADIOGRAPHIC STUDIES: I have personally reviewed the radiological images as listed and agreed with the findings in the report. DG Chest 2 View  Result Date: 09/28/2020 CLINICAL DATA:  79 year old female with possible foot infection. EXAM: CHEST - 2 VIEW COMPARISON:  Chest radiograph dated 06/22/2020. FINDINGS: There is mild cardiomegaly with mild vascular congestion. Small left pleural effusion and left lung base atelectasis or infiltrate. Trace right pleural effusion may be present. No pneumothorax. Osteopenia with degenerative changes of the spine. No acute osseous pathology. IMPRESSION: 1. Cardiomegaly with mild vascular congestion. 2. Small left pleural effusion and left lung base atelectasis or infiltrate. Electronically Signed  By: Anner Crete M.D.   On: 09/28/2020 16:07   DG Shoulder Right  Result Date: 10/14/2020 CLINICAL DATA:  [place], bruising, glenoid abnormality on CT EXAM: RIGHT SHOULDER - 2+ VIEW COMPARISON:  10/14/2020 FINDINGS: Internal rotation, external rotation, and transscapular views of the right shoulder are obtained. There are no acute displaced fractures. Severe right shoulder osteoarthritis is seen with glenohumeral and acromioclavicular joint hypertrophic  changes. Veiling opacity at the right lung base consistent with known pleural effusion. IMPRESSION: 1. No acute displaced fracture. 2. Severe right shoulder osteoarthritis. 3. Right pleural effusion. Electronically Signed   By: Randa Ngo M.D.   On: 10/14/2020 18:55   US RENAL  Result Date: 10/04/2020 CLINICAL DATA:  ATN EXAM: RENAL / URINARY TRACT ULTRASOUND COMPLETE COMPARISON:  CT 01/16/2020, ultrasound 10/29/2012 FINDINGS: Right Kidney: Renal measurements: 11.2 x 6.1 x 6.3 cm = volume: 224 mL. Echogenicity within normal limits. No mass or hydronephrosis visualized. Left Kidney: Renal measurements: 10.7 x 7.1 x 6.3 cm = volume: 215 mL. Left kidney is poorly visible, particularly the lower pole. Cyst at the upper pole measuring 3.1 x 3.3 x 3.6 cm. No gross hydronephrosis. Bladder: Appears normal for degree of bladder distention. Other: Cirrhotic morphology of the liver which appears echogenic and nodular. There is a small amount of ascites adjacent to the liver. There is a small left-sided pleural effusion. IMPRESSION: 1. Normal ultrasound appearance of the right kidney 2. Limited visibility of left kidney secondary to bowel gas and positioning. Simple appearing cyst upper pole left kidney. No hydronephrosis. 3. Cirrhotic morphology of the liver with small amount of ascites in the right upper quadrant. Left-sided pleural effusion. Electronically Signed   By: Donavan Foil M.D.   On: 10/04/2020 15:47   US Venous Img Lower Bilateral  Result Date: 09/28/2020 CLINICAL DATA:  79 year old female with bilateral lower extremity edema. EXAM: Bilateral LOWER EXTREMITY VENOUS DOPPLER ULTRASOUND TECHNIQUE: Gray-scale sonography with compression, as well as color and duplex ultrasound, were performed to evaluate the deep venous system(s) from the level of the common femoral vein through the popliteal and proximal calf veins. COMPARISON:  None. FINDINGS: VENOUS Normal compressibility of the common femoral,  superficial femoral, and popliteal veins, as well as the visualized calf veins. Visualized portions of profunda femoral vein and great saphenous vein unremarkable. No filling defects to suggest DVT on grayscale or color Doppler imaging. Doppler waveforms show normal direction of venous flow, normal respiratory plasticity and response to augmentation. Limited views of the contralateral common femoral vein are unremarkable. OTHER None. Limitations: There is mild diffuse subcutaneous edema. No fluid collection. IMPRESSION: Negative. Electronically Signed   By: Anner Crete M.D.   On: 09/28/2020 18:40   CT CHEST ABDOMEN PELVIS WO CONTRAST  Result Date: 10/14/2020 CLINICAL DATA:  Fall, low hemoglobin and hematocrit, bruising in the right upper back and right shoulder pain. History of fallopian tube malignancy noted during hysterectomy. History of CHF, COPD, CKD EXAM: CT CHEST, ABDOMEN AND PELVIS WITHOUT CONTRAST TECHNIQUE: Multidetector CT imaging of the chest, abdomen and pelvis was performed following the standard protocol without IV contrast. COMPARISON:  CT 01/16/2020, 11/16/2012, renal ultrasound 10/04/2020 FINDINGS: CT CHEST FINDINGS Cardiovascular: Limited assessment of the vasculature in the absence of contrast media. Cardiomegaly with predominantly right atrial enlargement. Hypoattenuation of cardiac blood pool compatible with anemia. Three-vessel coronary artery atherosclerosis. Trace pericardial effusion. Atherosclerotic plaque within the normal caliber aorta. Shared origin of the brachiocephalic and left common carotid arteries. Calcification of the proximal great vessels  as well. Central pulmonary arteries are normal caliber. Luminal assessment precluded. No major venous abnormalities are seen. Mediastinum/Nodes: No mediastinal fluid or gas. Normal thyroid gland and thoracic inlet. No acute abnormality of the trachea or esophagus. No worrisome mediastinal or axillary adenopathy. Hilar nodal  evaluation is limited in the absence of intravenous contrast media. Lungs/Pleura: Low-attenuation bilateral pleural effusions. Adjacent areas of passive atelectasis. Mild airways thickening. Some additional patchy opacities are present in the partially atelectatic left lower lobe, underlying aspiration or airspace disease not excluded. No pneumothorax. Background of centrilobular predominant emphysematous changes. Clustered micro nodules are seen in the anterior segment left upper lobe and lingula, possibly infectious or inflammatory. No concerning pulmonary nodules or masses. Musculoskeletal: Soft tissue swelling and contusive changes along the right lateral chest wall. There is a lobular hyperdense collection along the right chest wall concerning for an actively bleeding hematoma in the setting of this noncontrast CT. This measures up to 3.9 cm in maximal thickness extending 14.5 cm in AP diameter and 17.5 cm in craniocaudal extent. Majority of this collection appears subjacent to the serve radius though there is loss of discernible fat plane suggesting a possible intramuscular source. Portions of the shoulders are collimated from view. There is questionable cortical step-off along the posterior aspect of the glenoid in the right shoulder seen on the first images of the sequences (2/1), and a glenoid injury is not fully excluded. Consider dedicated imaging. Additionally, sagittal reconstruction there is a mild cortical step-off along the superior aspect of the manubrium albeit without adjacent soft tissue swelling thickening. Possibly related to step artifact though a minimally displaced fracture is certainly possible. Correlate point tenderness. No visible displaced rib fractures. Several remote appearing deformities of the left anterolateral lower thoracic ribs, several seen on comparison from March 2021. Multilevel degenerative changes are present in the imaged portions of the spine. Multilevel flowing anterior  osteophytosis, compatible with features of diffuse idiopathic skeletal hyperostosis (DISH). Intraosseous hemangioma noted at T12. Additional degenerative changes in the shoulders. CT ABDOMEN PELVIS FINDINGS Hepatobiliary: Possible hypoattenuating lesion abutting the gallbladder fossa (2/66) measuring approximately 1.8 cm in size, incompletely characterized on this unenhanced CT with diminished imaging quality due to motion artifact. Hypertrophy of the left lobe liver with a nodular liver surface contour suggestive of cirrhotic changes. Normal liver attenuation. Normal gallbladder distention. No visible calcified gallstones. Stable surgical clips near the gallbladder fundus. Pericholecystic fluid may reflect redistributed ascites. No biliary ductal dilatation. Pancreas: Partial fatty replacement of the pancreas. No pancreatic ductal dilatation or surrounding inflammatory changes. Spleen: Splenomegaly. No concerning focal splenic lesions. Capsular calcification along the posterior spleen (2/64) stable from prior may reflect sequela of prior injury. Adrenals/Urinary Tract: Macroscopic fat containing lesion in the medial limb of the left adrenal gland suggestive of myelolipoma while a more intermediate to low-attenuation lesion in the body could reflect small adrenal adenoma. Overall stable appearance from prior accounting for motion artifact and differences in technique. No concerning right adrenal lesion. Redemonstration of a 4.5 cm fluid attenuation cyst in the upper pole left kidney, not significantly changed from prior. Stable mild bilateral perinephric stranding is nonspecific can be seen with advanced age or diminished renal function. No new concerning focal renal lesion. No urolithiasis or hydronephrosis. Urinary bladder is unremarkable for the degree of distention. Stomach/Bowel: Distal esophagus, stomach and duodenal sweep are unremarkable. No small bowel wall thickening or dilatation. No evidence of  obstruction. Appendix is not visualized. No focal inflammation the vicinity of the cecum to suggest  an occult appendicitis. Cecum displaced to the midline pelvis without frank volvulus. Moderate colonic stool burden. No colonic dilatation or wall thickening. Scattered colonic diverticula without focal inflammation to suggest diverticulitis. Vascular/Lymphatic: No worrisome abdominopelvic adenopathy. Multiple surgical clips in the retroperitoneum likely reflecting prior nodal dissection. Extensive atherosclerotic calcification is similar to prior. Luminal evaluation severely limited in the absence of contrast media. Reproductive: Uterus is surgically absent. No concerning adnexal lesions. No concerning adnexal lesions are identified. Other: Extensive circumferential body wall edema. More focal soft tissue thickening of the right chest wall and flank may reflect some additional contusive change in the setting of recent fall. No traumatic abdominal wall dehiscence. No bowel containing hernias. Postsurgical changes from low vertical midline incision. Musculoskeletal: No acute fracture or traumatic osseous injury of the lumbar spine or bony pelvis. Proximal femora are intact and normally located. Unchanged grade 1 anterolisthesis L4 on 5. Multilevel degenerative changes are present in the imaged portions of the spine. Additional degenerative changes throughout the hips and pelvis. No worrisome osseous lesions. IMPRESSION: Traumatic Findings: 1. Contusive changes along the right posterolateral chest wall and flank and lateral right breast soft tissues. Larger heterogeneous, hyperdense chest wall hematoma which appears subjacent to or possibly arising from the serrated is musculature. Concerning for acute hemorrhage in the setting of declining hemoglobin. No visible displaced rib fractures though bony demineralization may detection of subtle nondisplaced fracture. 2. Possible fracture along the posterior right glenoid,  incompletely imaged on this exam. Consider dedicated right shoulder imaging. 3. Additional questionable cortical step-off of the superior manubrium albeit without adjacent soft tissue thickening or stranding. Correlate for point tenderness to assess for nondisplaced fracture. 4. Unenhanced CT was performed per clinician order. Lack of IV contrast limits sensitivity and specificity, especially for evaluation of abdominal/pelvic solid viscera. 5. Ill-defined questionable hypoattenuating 1.8 cm lesion adjacent the gallbladder fossa. A rounded configuration is less suggestive of a acute traumatic liver lesion though not fully excluded. Regardless, at minimum further assessment with outpatient MRI should be considered given underlying intrinsic liver disease/cirrhosis. Nontraumatic Findings: 1. Features of volume overload/anasarca with diffuse body wall edema, bilateral pleural effusions, ascites. Possibly related to a chronic history of CHF or underlying cirrhosis. 2. Passive atelectatic changes adjacent the pleural effusions with some more patchy opacity in the left lung base and micro nodularity in the left upper lobe and lingula which could reflect a superimposed infectious or inflammatory process. 3. Stigmata of cirrhosis include a nodular liver, left lobe hypertrophy, and splenomegaly. 4. 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 5. Mild bilateral perinephric stranding, can reflect diminished renal function or senescent change though could correlate for urinary symptoms and consider urinalysis as appropriate. 6. Postsurgical changes from prior retroperitoneal nodal dissection. 7. Prior hysterectomy. 8. Cardiomegaly, three-vessel coronary artery atherosclerosis. 9.  Aortic Atherosclerosis (ICD10-I70.0). 10.  Emphysema (ICD10-J43.9). These results were called by telephone at the time of interpretation on 10/14/2020 at 5:44  pm to provider [facility] , who verbally acknowledged these results. Electronically Signed   By: Lovena Le M.D.   On: 10/14/2020 17:47    Assessment and plan-   #Right chest wall hematoma/soft tissue injury secondary to injury from her fall Pain control.   #Acute on chronic anemia, likely secondary to blood loss due to hematoma. Increased reticulocyte 5% indicate go next od bone marrow response. Normal LDH. Negative Coombs testing. Less likely hemolysis. 12/5 Hb is stable at 8.7.  Continue monitoring.  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  Continue for now.  #Radiographic evidence of cirrhosis-with nodular liver contour, left lobe hypertrophy, splenomegaly..-Ascites, pleural effusion, No significant EtOH use. Hepatitis negative. Patient will need a comprehensive gastroenterology work-up for cirrhosis. She was on aspirin and Eliquis which were both held at admission due to acute anemia. I will repeat her coags today which were normal. Assuming her hemoglobin continues to be stable tomorrow, I think she may restart on aspirin first.  She should follow-up with Dr. Mike Gip for further discussion of Eliquis.  #Newly defined questionable hypoattenuated 1.8 cm lesion adjacent to gallbladder fossa. Patient will need further MRI abdomen for work-up.  Thank you for allowing me to participate in the care of this patient. Dr. Mike Gip will follow next week.  Earlie Server, MD, PhD Hematology Oncology [facility] Pager- [phone] 10/18/2020

## 2020-10-18 NOTE — Progress Notes (Signed)
Cross Cover  Patient with new onset redness, warmth and edema of right upper extremity. Ultrasound done and is negative for DVT. Bedside exam pulses good and does not appear to have any evidence of cellulitis

## 2020-10-18 NOTE — Treatment Plan (Signed)
Pt noted to have swelling of the right upper ectremity with noted hand swelling and upper arm, warm to the touch, pain in arm per patient. Change from earlier. MD Jamse Arn paged at this time.

## 2020-10-18 NOTE — TOC Progression Note (Signed)
Transition of Care [facility]) - Progression Note    Patient Details  Name: Angelica Juarez MRN: [id number] Date of Birth: 1941-08-07  [facility] CM/SW Contact  Zigmund Daniel Dorian Pod, RN Phone Number: 10/18/2020, 11:54 AM  Clinical Narrative:    [facility] RN attempted to speak with pt unsuccessful. RN spoke with [name] to confirm no bed available at [facility] until Monday. Confirmation from admission coordinator [facility]. Team updated along with Dr. Jamse Arn.  TOC will follow up with the facility for admission tomorrow.    Expected Discharge Plan: Skilled Nursing Facility Barriers to Discharge: Continued Medical Work up  Expected Discharge Plan and Services Expected Discharge Plan: [place] Acute Care Choice: Resumption of Svcs/PTA Provider Living arrangements for the past 2 months: Single Family Home, Skilled Nursing Facility                                       Social Determinants of Health (SDOH) Interventions    Readmission Risk Interventions Readmission Risk Prevention Plan 10/06/2020 09/30/2020 06/03/2019  Transportation Screening - Complete Complete  PCP or Specialist Appt within 5-7 Days - - Complete  Home Care Screening - - Complete  Medication Review (RN CM) - - Complete  HRI or Home Care Consult - Complete -  Palliative Care Screening - Not Applicable -  Medication Review (RN Care Manager) - Complete -  [place] or Home Care Consult Complete - -  Palliative Care Screening Not Applicable - -  Skilled Nursing Facility Complete - -  Some recent data might be hidden

## 2020-10-18 NOTE — Progress Notes (Signed)
NP aware of [place] results. No new orders at this time.

## 2020-10-18 NOTE — Progress Notes (Addendum)
PROGRESS NOTE    Angelica Juarez  DGU:[id number]  DOB: Jul 01, 1941  DOA: 10/14/2020 PCP: Sofie Hartigan, MD Outpatient Specialists:   Hospital course:  79 year old female with PCV, A. fib on anticoagulation, HFpEF, HTN, COPD, CKD 3 was admitted 10/14/2020 with symptomatic anemia.  Hemoglobin was noted to be 6.9 down from baseline of 10.  Blood pressure was low normal but she was tachycardic.  Work-up revealed massive right-sided bruising.  CT revealed no retroperitoneal hematoma but she did have a chest wall hematoma.  It also revealed a nodular liver consistent with cirrhosis.   Subjective:  She states she feels no different although thinks she might have a little bit more energy.  [place] she is moving around more easily than before.  Pain is improved.  No chest pain or shortness of breath.   Objective: Vitals:   10/18/20 0402 10/18/20 0840 10/18/20 1120 10/18/20 1556  BP: (!) 153/71 110/68 118/71 (!) 155/79  Pulse: 97 (!) 54 95 95  Resp: 20 20 20 16   Temp: 98 F (36.7 C) 98.1 F (36.7 C) 97.6 F (36.4 C) 97.8 F (36.6 C)  TempSrc: Oral Oral Oral   SpO2: 92% 94% 99% 94%  Weight: 97.8 kg     Height:        Intake/Output Summary (Last 24 hours) at 10/18/2020 1641 Last data filed at 10/18/2020 0600 Gross per 24 hour  Intake 480 ml  Output 1300 ml  Net -820 ml   Filed Weights   10/14/20 1321 10/18/20 0402  Weight: 104.6 kg 97.8 kg     Exam:  General: Patient looking brighter and moving much more easily sitting up on side of bed in no acute distress.   Eyes: sclera anicteric, conjuctiva mild injection bilaterally CVS: S1-S2, irregular  Respiratory: Massive right-sided ecchymoses are fading.  Decreased air entry bilaterally secondary to decreased inspiratory effort, rales at bases  GI: NABS, soft, NT bruising is noted above LE: 2+ edema bilaterally.  She has some erythema on her left lower forefoot extending up to her ankle surrounding a shallow ulcer. Neuro: A/O x  3, Moving all extremities equally with normal strength, CN 3-12 intact, grossly nonfocal.  Psych: patient is logical and coherent, judgement and insight appear normal, mood and affect appropriate to situation.   Assessment & Plan:   Acute blood loss anemia secondary to large chest wall hematoma and known microscopic colitis H&H have stabilized since aspirin and Eliquis have been held. Pressure is also normalized. Patient is ready for discharge, awaiting bed at her previous rehab facility. Because of acute blood loss was most likely the 18 x 15 cm chest wall hematoma which was thought to be bleeding on CT. Patient's not had any diarrhea or black stools since admission. Received 2 units irradiated PRBC and hemoglobin this morning was 8.9.  Patient was seen by hematology who recommended keeping hemoglobin greater than 8.  Cirrhosis Patient noted to have a nodular liver suggestive of cirrhosis--states she is never been told she has cirrhosis as far she is aware. Previous work-up including hepatitis B surface antigen and hepatitis C are negative. Prior edema had been thought to be secondary to HFpEF although as noted below she has normal EF normal diastolic parameters. Patient states she has an outpatient gastroenterologist Dr Tedra Coupe and will follow up with her for further w/u and initiation of usual meds for cirrhosis.  Volume overload Patient is diuresing well on Lasix 40 mg daily, decreased from 60 twice daily Diffuse body  wall edema, bilateral pleural effusions, ascites is most likely secondary to cirrhosis Initiation of spironolactone can be started by her gastroenterologist as an outpatient  Incidentally noted 1.8 cm lesion at gallbladder fossa Will need an outpatient MRI for follow-up as recommended Patient was seen by Dr. Tasia Catchings, hematology oncology who noted this as well.  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  Polycythemia vera Continue hydroxyurea Appreciate heme-onc consult  Patient has a diagnosis of HFpEF However echo done 06/12/2020 showed EF of 50 to 55% with normal diastolic parameters  Pulmonary hypertension chronic hypoxic respiratory failure Patient was noted to have a PA pressure of 73 mmHg with moderately dilated RA. This can be followed up as an outpatient with cardiology Continue home O2 2 L [place]  Atrial fibrillation Eliquis is being held due to hematoma and anemia Patient is not on any rate control medications at home  COPD No evidence for acute flare To new inhaled bronchodilators, Anoro Ellipta and Singulair  Possible fracture of glenoid and manubrium Patient able to move her shoulder and arm with minimal tenderness No step-off noted on manubrium  Redness of left ankle Patient is s/p treatment for cellulitis which grew out Enterobacter and Klebsiella from the wound Both patient and husband note the ankle is better. Skin biopsy to rule out pemphigus/pemphigoid is negative, perivascular eosinophilic infiltration consistent with allergic reaction, possible drug reaction.  Patchy opacity in left lung base Possibly suggestive of infection or inflammatory Patient is afebrile with no new cough WBC is 11 and improved from prior admission Will follow with low threshold for antibiotics should she develop anything suggestive of clinical pneumonia.  Seizure disorder Continue Keppra 500 twice daily   DVT prophylaxis: SCD Code Status: Full Family Communication: Spoke with patient and husband at bedside Disposition Plan:   Patient is from: Rehab  Anticipated Discharge Location: Rehab  Barriers to Discharge: Bed availability  Is patient medically stable for Discharge: Yes   Consultants:  Hematology oncology  Procedures:  None  Antimicrobials:  None   Data Reviewed:  Basic Metabolic Panel: Recent Labs  Lab 10/14/20 1330 10/15/20 0420  10/16/20 0337 10/17/20 0414 10/18/20 0437  NA 138 139 139 137 140  K 5.6* 4.7 4.7 4.7 4.2  CL 103 105 101 101 102  CO2 27 25 27 27 28   GLUCOSE 99 104* 121* 115* 107*  BUN 32* 30* 34* 34* 30*  CREATININE 1.82* 1.52* 1.50* 1.23* 1.03*  CALCIUM 9.5 8.7* 9.4 9.2 9.3  MG  --   --  1.9  --   --   PHOS  --   --  4.0  --   --    Liver Function Tests: Recent Labs  Lab 10/14/20 0932 10/14/20 1330 10/17/20 0414  AST 24 25 22   ALT 17 18 14   ALKPHOS 63 62 68  BILITOT 0.9 1.1 1.4*  PROT 5.8* 6.1* 5.5*  ALBUMIN 3.0* 3.2* 3.0*   No results for input(s): LIPASE, AMYLASE in the last 168 hours. No results for input(s): AMMONIA in the last 168 hours. CBC: Recent Labs  Lab 10/14/20 0932 10/14/20 0932 10/14/20 1330 10/14/20 1330 10/15/20 0420 10/15/20 0420 10/16/20 0337 10/16/20 1602 10/16/20 2315 10/17/20 0414 10/18/20 0437  WBC 13.8*   < > 13.5*  --  11.1*  --  11.1*  --   --  11.3* 10.9*  NEUTROABS 11.8*  --  11.6*  --   --   --   --   --   --   --   --  HGB 6.9*   < > 7.1*   < > 7.2*   < > 8.9* 8.5* 8.5* 8.7* 8.7*  HCT 22.3*   < > 23.0*   < > 23.1*   < > 28.1* 27.2* 27.4* 27.1* 28.3*  MCV 93.3   < > 93.9  --  94.3  --  92.7  --   --  91.6 94.6  PLT 367   < > 384  --  305  --  358  --   --  334 327   < > = values in this interval not displayed.   Cardiac Enzymes: No results for input(s): CKTOTAL, CKMB, CKMBINDEX, TROPONINI in the last 168 hours. BNP (last 3 results) No results for input(s): PROBNP in the last 8760 hours. CBG: Recent Labs  Lab 10/17/20 1135 10/17/20 1647 10/17/20 1956 10/18/20 0749 10/18/20 1219  GLUCAP 113* 102* 118* 112* 129*    Recent Results (from the past 240 hour(s))  Resp Panel by RT-PCR (Flu A&B, Covid) Nasopharyngeal Swab     Status: None   Collection Time: 10/14/20  8:32 PM   Specimen: Nasopharyngeal Swab; Nasopharyngeal(NP) swabs in vial transport medium  Result Value Ref Range Status   SARS Coronavirus 2 by RT PCR NEGATIVE NEGATIVE  Final    Comment: (NOTE) SARS-CoV-2 target nucleic acids are NOT DETECTED.  The SARS-CoV-2 RNA is generally detectable in upper respiratory specimens during the acute phase of infection. The lowest concentration of SARS-CoV-2 viral copies this assay can detect is 138 copies/mL. A negative result does not preclude SARS-Cov-2 infection and should not be used as the sole basis for treatment or other patient management decisions. A negative result may occur with  improper specimen collection/handling, submission of specimen other than nasopharyngeal swab, presence of viral mutation(s) within the areas targeted by this assay, and inadequate number of viral copies(<138 copies/mL). A negative result must be combined with clinical observations, patient history, and epidemiological information. The expected result is Negative.  Fact Sheet for Patients:  EntrepreneurPulse.com.au  Fact Sheet for Healthcare Providers:  IncredibleEmployment.be  This test is no t yet approved or cleared by the [place] FDA and  has been authorized for detection and/or diagnosis of SARS-CoV-2 by FDA under an Emergency Use Authorization (EUA). This EUA will remain  in effect (meaning this test can be used) for the duration of the COVID-19 declaration under Section 564(b)(1) of the Act, 21 U.S.C.section 360bbb-3(b)(1), unless the authorization is terminated  or revoked sooner.       Influenza A by PCR NEGATIVE NEGATIVE Final   Influenza B by PCR NEGATIVE NEGATIVE Final    Comment: (NOTE) The Xpert Xpress SARS-CoV-2/FLU/RSV plus assay is intended as an aid in the diagnosis of influenza from Nasopharyngeal swab specimens and should not be used as a sole basis for treatment. Nasal washings and aspirates are unacceptable for Xpert Xpress SARS-CoV-2/FLU/RSV testing.  Fact Sheet for Patients: EntrepreneurPulse.com.au  Fact Sheet for Healthcare  Providers: IncredibleEmployment.be  This test is not yet approved or cleared by the [place] FDA and has been authorized for detection and/or diagnosis of SARS-CoV-2 by FDA under an Emergency Use Authorization (EUA). This EUA will remain in effect (meaning this test can be used) for the duration of the COVID-19 declaration under Section 564(b)(1) of the Act, 21 U.S.C. section 360bbb-3(b)(1), unless the authorization is terminated or revoked.  Performed at [facility], [address] [place], [place] [address]       Studies: No results found.  Scheduled Meds: . atorvastatin  40 mg Oral Daily  . diphenhydrAMINE  25 mg Oral Daily  . fluticasone  2 spray Each Nare Daily  . furosemide  40 mg Intravenous Daily  . [START ON 10/19/2020] hydroxyurea  500 mg Oral 2 times per day on Mon   And  . hydroxyurea  500 mg Oral Once per day on Sun Tue Wed Thu Fri Sat  . insulin aspart  0-15 Units Subcutaneous TID WC  . levETIRAcetam  500 mg Oral BID  . montelukast  10 mg Oral Daily  . multivitamin with minerals  1 tablet Oral Daily  . mupirocin ointment   Topical Daily  . pneumococcal 23 valent vaccine  0.5 mL Intramuscular Once  . sodium chloride flush  3 mL Intravenous Q12H  . traZODone  100 mg Oral QHS  . umeclidinium-vilanterol  1 puff Inhalation Daily   Continuous Infusions: . 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

## 2020-10-19 DIAGNOSIS — D62 Acute posthemorrhagic anemia: Secondary | ICD-10-CM | POA: Diagnosis not present

## 2020-10-19 LAB — GLUCOSE, CAPILLARY
Glucose-Capillary: 142 mg/dL — ABNORMAL HIGH (ref 70–99)
Glucose-Capillary: 107 mg/dL — ABNORMAL HIGH (ref 70–99)

## 2020-10-19 LAB — SARS CORONAVIRUS 2 BY RT PCR (HOSPITAL ORDER, PERFORMED IN ~~LOC~~ HOSPITAL LAB): SARS Coronavirus 2: NEGATIVE

## 2020-10-19 NOTE — TOC Transition Note (Signed)
Transition of Care [facility]) - CM/SW Discharge Note   Patient Details  Name: MARISOL GIAMBRA MRN: [id number] Date of Birth: 02-Jan-1941  [facility]) CM/SW Contact:  Candie Chroman, LCSW Phone Number: 10/19/2020, 3:09 PM   Clinical Narrative: Patient has orders to discharge to [facility] SN[facility] today. RN will call report to [phone] (Room E6). Transport set up for 4:00. No further concerns. CSW signing off.    Final next level of care: Skilled Nursing Facility Barriers to Discharge: Barriers Resolved   Patient Goals and CMS Choice     Choice offered to / list presented to : NA  Discharge Placement   Existing PASRR number confirmed : 10/16/20          Patient chooses bed at: Other - please specify in the comment section below: ([facility]) Patient to be transferred to facility by: EMS Name of family member notified: Patient will notify family Patient and family notified of of transfer: 10/19/20  Discharge Plan and Services     Post Acute Care Choice: Resumption of Svcs/PTA Provider                               Social Determinants of Health (SDOH) Interventions     Readmission Risk Interventions Readmission Risk Prevention Plan 10/06/2020 09/30/2020 06/03/2019  Transportation Screening - Complete Complete  PCP or Specialist Appt within 5-7 Days - - Complete  Home Care Screening - - Complete  Medication Review (RN CM) - - Complete  HRI or Home Care Consult - Complete -  Palliative Care Screening - Not Applicable -  Medication Review (RN Care Manager) - Complete -  HRI or Home Care Consult Complete - -  Palliative Care Screening Not Applicable - -  [place] Complete - -  Some recent data might be hidden

## 2020-10-19 NOTE — Discharge Summary (Signed)
Physician Discharge Summary  Angelica Juarez KDX:[id number] DOB: 1941/06/30 DOA: 10/14/2020  PCP: Sofie Hartigan, MD  Admit date: 10/14/2020 Discharge date: 10/19/2020  Recommendations for Outpatient Follow-up:  1. Discharge to SNF with PT/OT 2. Chemistry to be checked on 10/26/2020. Results reported to facility physician. 3. Weigh patient daily. Notify facility physician if patient gains 3 lbs or more in one day or 5 lbs or more in one week. 4. Pt is to follow up with PCP in 7-10 days after discharge from SNF. 5. Follow up with oncology as directed. 6. Follow up with pulmonology in 4-6 weeks.   Contact information for follow-up providers    Feldpausch, Chrissie Noa, MD Follow up.   Specialty: Family Medicine Contact information: [place] [address] [phone]        Earlie Server, MD Follow up.   Specialty: Oncology Contact information: [place] [address] [phone]            Contact information for after-discharge care    Destination    [facility] .   Service: Skilled Nursing Contact information: [address] [phone]                  Discharge Diagnoses: Principal diagnosis is #1 1. Acute blood loss anemia 2. Chest wall hematoma 3. Polycythemia vera 4. HFpEF 5. Pulmonary hypertension 6. Atrial fibrillation 7. COPD  Discharge Condition: Fair  Disposition: SNF  Diet recommendation: Heart health/Modified carbohydrate  Filed Weights   10/14/20 1321 10/18/20 0402  Weight: 104.6 kg 97.8 kg    History of present illness:  Angelica Juarez is a 79 y.o. female with medical history significant for polycythemia vera, stage III chronic kidney disease, COPD, A. fib on anticoagulation, chronic diastolic dysfunction CHF and hypertension who was sent to the emergency room for evaluation from her hematologist office.  Patient  had gone for follow-up appointment and complained of generalized weakness as well as worsening shortness of breath with mild to moderate exertion.  She also complains of feeling dizzy and lightheaded when she stands up.  She denies having any chest pain.  Patient is status post fall at the rehab facility where she resides and is noted to have significant ecchymosis on her right side involving the right lateral chest wall and breast.  Labs done at her hematologist office showed a hemoglobin of 6.9g/dl down from her baseline of 9.7g/dl on 10/06/20.  Her hematologist was concerned that she may have retroperitoneal hemorrhage and so sent the patient to the ER for further evaluation and to obtain a noncontrast CT scan of abdomen and pelvis. At baseline she is short of breath due to her COPD and also has a chronic cough productive of clear phlegm. She denies having any hematemesis, no melena stools or hematochezia. She denies having any nausea, no vomiting, no abdominal pain, no changes in her bowel habits, no urinary symptoms or any changes in her bowel habits. Labs show sodium 138, potassium 5.6, chloride 103, bicarb 27, glucose 99, BUN 32, creatinine 1.82, calcium 9.5, alkaline phosphatase 62, albumin 3.2, AST 25, ALT 18, total protein 6.1, total bilirubin 1.1, serum iron 21, total iron-binding capacity 340, ferritin 45, white count 13.5, hemoglobin 7.1, hematocrit 23.0, MCV 93.9, RDW 384, PT 19.6, INR 1.7 CT scan of chest, abdomen and pelvis shows contusive changes along the right posterolateral chest wall and flank and  lateral right breast soft tissues. Larger heterogeneous, hyperdense chest wall hematoma which appears subjacent to or possibly arising from the serrated is musculature. Concerning for acute hemorrhage in the setting of declining hemoglobin. No visible displaced rib fractures though bony demineralization may detection of subtle nondisplaced fracture. Possible fracture along the posterior right  glenoid, incompletely imaged on this exam. Consider dedicated right shoulder imaging. Additional questionable cortical step-off of the superior manubrium albeit without adjacent soft tissue thickening or stranding. Correlate for point tenderness to assess for nondisplaced fracture. Ill-defined questionable hypoattenuating 1.8 cm lesion adjacent the gallbladder fossa. A rounded configuration is less suggestive of a acute traumatic liver lesion though not fully excluded. Regardless, at minimum further assessment with outpatient MRI should be considered given underlying intrinsic liver disease/cirrhosis. Non traumatic findings on the CT scan show features of volume overload/anasarca with diffuse body wall edema, bilateral pleural effusions, ascites. Possibly related to a chronic history of CHF or underlying cirrhosis. Passive atelectatic changes adjacent the pleural effusions with some more patchy opacity in the left lung base and micro nodularity in the left upper lobe and lingula which could reflect a superimposed infectious or inflammatory process. Stigmata of cirrhosis include a nodular liver, left lobe hypertrophy, and splenomegaly. 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 Mild bilateral perinephric stranding, can reflect diminished renal function or senescent change though could correlate for urinary symptoms and consider urinalysis as appropriate. Respiratory viral panel is still pending at the time of this H&P Twelve-lead EKG reviewed by me shows atrial fibrillation   ED Course: Patient is a 79 year old female who was sent to the emergency room by her hematologist for evaluation for symptomatic anemia.  Patient noted to have a drop in her H&H from 9.7g/dl to 6.9g/dl in 1 week and is status post fall with ecchymosis on her right side concerning for retroperitoneal bleed.  CT scan of chest, abdomen  and pelvis was done which showed chest wall hematoma.  Patient is on chronic anticoagulation therapy with Eliquis for A. fib.  She will be referred to observation status for further evaluation.  Hospital Course: 78 year old female with PCV, A. fib on anticoagulation, HFpEF, HTN, COPD, CKD 3 was admitted 10/14/2020 with symptomatic anemia.  Hemoglobin was noted to be 6.9 down from baseline of 10.  Blood pressure was low normal but she was tachycardic.  Work-up revealed massive right-sided bruising.  CT revealed no retroperitoneal hematoma but she did have a chest wall hematoma.  It also revealed a nodular liver consistent with cirrhosis. There was also an abnormality noted in the gallbladder fossa that will need follow up with MRI as outpatient.  The patient was admitted to a telemetry bed. She has been evaluated by pulmonology, oncology and nephrology during her stay. Her hemoglobin is stable after she has received 2 units in transfusion. She was evaluated by PT/OT. Recommendation was for the patient to discharge to SNF.  The patient will follow up with oncology, pulmonology, and PCP as outpatient.  She is discharged to SNF with PT/OT today in fair condition.  Today's assessment: S: The patient is resting comfortably. No new complaints. O: Vitals:  Vitals:   10/19/20 0700 10/19/20 1125  BP: (!) 142/61 (!) 142/61  Pulse: (!) 105 95  Resp: 14 16  Temp: 97.8 F (36.6 C) 97.8 F (36.6 C)  SpO2: 94%    Exam:  Constitutional:  . The patient is awake, alert, and oriented  x 3. No acute distress. Respiratory:  . No increased work of breathing. . No wheezes, rales, or rhonchi . No tactile fremitus Cardiovascular:  . Regular rate and rhythm . No murmurs, ectopy, or gallups. . No lateral PMI. No thrills. Abdomen:  . Abdomen is soft, non-tender, non-distended . No hernias, masses, or organomegaly . Normoactive bowel sounds.  Musculoskeletal:  . No cyanosis, clubbing, or edema Skin:   . No rashes, lesions, ulcers . palpation of skin: no induration or nodules . Large area of ecchymosis on the patient's chest wall. Neurologic:  . CN 2-12 intact . Sensation all 4 extremities intact Psychiatric:  . Mental status o Mood, affect appropriate o Orientation to person, place, time  . judgment and insight appear intact   Discharge Instructions  Discharge Instructions    (HEART FAILURE PATIENTS) Call MD:  Anytime you have any of the following symptoms: 1) 3 pound weight gain in 24 hours or 5 pounds in 1 week 2) shortness of breath, with or without a dry hacking cough 3) swelling in the hands, feet or stomach 4) if you have to sleep on extra pillows at night in order to breathe.   Complete by: As directed    Activity as tolerated - No restrictions   Complete by: As directed    Call MD for:  difficulty breathing, headache or visual disturbances   Complete by: As directed    Call MD for:  redness, tenderness, or signs of infection (pain, swelling, redness, odor or green/yellow discharge around incision site)   Complete by: As directed    Call MD for:  temperature >100.4   Complete by: As directed    Diet - low sodium heart healthy   Complete by: As directed    Diet Carb Modified   Complete by: As directed    Discharge instructions   Complete by: As directed    Discharge to SNF Chemistry to be checked on 10/26/2020. Results reported to facility physician. Weigh patient daily. Notify facility physician if patient gains 3 lbs or more in one day or 5 lbs or more in one week. Pt is to follow up with PCP in 7-10 days after discharge from SNF. Follow up with oncology as directed.   Discharge wound care:   Complete by: As directed    Cleanse wounds to left foot with NS.  Apply mupirocin ointment to wound bed and cover with telfa and kerlix/tape  Moisturize both legs with sween cream daily.   Increase activity slowly   Complete by: As directed      Allergies as of 10/19/2020       Reactions   Iodinated Diagnostic Agents Anaphylaxis   Other reaction(s): Other (See Comments) Throat swells and extreme hives   Lamictal [lamotrigine] Rash   Suspected drug rash 09/2020   Latex Itching   Phenobarbital Hives   Tape Rash   silicones      Medication List    STOP taking these medications   apixaban 5 MG Tabs tablet Commonly known as: ELIQUIS   aspirin 81 MG tablet   Nifedical XL 30 MG 24 hr tablet Generic drug: NIFEdipine     TAKE these medications   Anoro Ellipta 62.5-25 MCG/INH Aepb Generic drug: umeclidinium-vilanterol Inhale 1 puff into the lungs daily.   atorvastatin 40 MG tablet Commonly known as: LIPITOR Take 40 mg by mouth daily.   diphenhydrAMINE 25 mg capsule Commonly known as: BENADRYL Take 25 mg by mouth daily.   fluticasone 50  MCG/ACT nasal spray Commonly known as: FLONASE Place 2 sprays into both nostrils daily.   furosemide 40 MG tablet Commonly known as: Lasix Take 1 tablet (40 mg total) by mouth daily.   hydroxyurea 500 MG capsule Commonly known as: HYDREA TAKE ONE CAPSULE BY MOUTH TWICE DAILY ON MONDAYS ONLY, 1 CAPSULE DAILY ON TUESDAY TO SUNDAY. MAY TAKE WITH FOOD TO MINIMIZE GI SIDE EFFECTS What changed: additional instructions   levETIRAcetam 500 MG tablet Commonly known as: KEPPRA Take 500 mg by mouth 2 (two) times daily.   metFORMIN 500 MG tablet Commonly known as: GLUCOPHAGE Take 500 mg by mouth 2 (two) times daily with a meal.   montelukast 10 MG tablet Commonly known as: SINGULAIR TAKE 1 TABLET BY MOUTH EVERY DAY What changed: when to take this   Multi-Vitamins Tabs Take 1 tablet by mouth daily.   OXYGEN Inhale 2 L into the lungs.   potassium chloride SA 20 MEQ tablet Commonly known as: KLOR-CON Take 20 mEq by mouth daily.   sitaGLIPtin 25 MG tablet Commonly known as: JANUVIA Take 1 tablet by mouth daily.   traZODone 100 MG tablet Commonly known as: DESYREL Take by mouth.    valsartan-hydrochlorothiazide 320-25 MG tablet Commonly known as: DIOVAN-HCT Take 1 tablet by mouth daily.   Ventolin HFA 108 (90 Base) MCG/ACT inhaler Generic drug: albuterol INHALE 2 PUFFS INTO THE LUNGS EVERY 6 HOURS AS NEEDED What changed: See the new instructions.   albuterol 1.25 MG/3ML nebulizer solution Commonly known as: ACCUNEB Take 3 mLs (1.25 mg total) by nebulization every 6 (six) hours as needed. wheezing What changed: Another medication with the same name was changed. Make sure you understand how and when to take each.            Discharge Care Instructions  (From admission, onward)         Start     Ordered   10/19/20 0000  Discharge wound care:       Comments: Cleanse wounds to left foot with NS.  Apply mupirocin ointment to wound bed and cover with telfa and kerlix/tape  Moisturize both legs with sween cream daily.   10/19/20 1430         Allergies  Allergen Reactions  . Iodinated Diagnostic Agents Anaphylaxis    Other reaction(s): Other (See Comments) Throat swells and extreme hives  . Lamictal [Lamotrigine] Rash    Suspected drug rash 09/2020  . Latex Itching  . Phenobarbital Hives  . Tape Rash    silicones    The results of significant diagnostics from this hospitalization (including imaging, microbiology, ancillary and laboratory) are listed below for reference.    Significant Diagnostic Studies: DG Chest 2 View  Result Date: 09/28/2020 CLINICAL DATA:  79 year old female with possible foot infection. EXAM: CHEST - 2 VIEW COMPARISON:  Chest radiograph dated 06/22/2020. FINDINGS: There is mild cardiomegaly with mild vascular congestion. Small left pleural effusion and left lung base atelectasis or infiltrate. Trace right pleural effusion may be present. No pneumothorax. Osteopenia with degenerative changes of the spine. No acute osseous pathology. IMPRESSION: 1. Cardiomegaly with mild vascular congestion. 2. Small left pleural effusion and left  lung base atelectasis or infiltrate. Electronically Signed   By: Anner Crete M.D.   On: 09/28/2020 16:07   DG Shoulder Right  Result Date: 10/14/2020 CLINICAL DATA:  [place], bruising, glenoid abnormality on CT EXAM: RIGHT SHOULDER - 2+ VIEW COMPARISON:  10/14/2020 FINDINGS: Internal rotation, external rotation, and transscapular views of the  right shoulder are obtained. There are no acute displaced fractures. Severe right shoulder osteoarthritis is seen with glenohumeral and acromioclavicular joint hypertrophic changes. Veiling opacity at the right lung base consistent with known pleural effusion. IMPRESSION: 1. No acute displaced fracture. 2. Severe right shoulder osteoarthritis. 3. Right pleural effusion. Electronically Signed   By: Randa Ngo M.D.   On: 10/14/2020 18:55   US RENAL  Result Date: 10/04/2020 CLINICAL DATA:  ATN EXAM: RENAL / URINARY TRACT ULTRASOUND COMPLETE COMPARISON:  CT 01/16/2020, ultrasound 10/29/2012 FINDINGS: Right Kidney: Renal measurements: 11.2 x 6.1 x 6.3 cm = volume: 224 mL. Echogenicity within normal limits. No mass or hydronephrosis visualized. Left Kidney: Renal measurements: 10.7 x 7.1 x 6.3 cm = volume: 215 mL. Left kidney is poorly visible, particularly the lower pole. Cyst at the upper pole measuring 3.1 x 3.3 x 3.6 cm. No gross hydronephrosis. Bladder: Appears normal for degree of bladder distention. Other: Cirrhotic morphology of the liver which appears echogenic and nodular. There is a small amount of ascites adjacent to the liver. There is a small left-sided pleural effusion. IMPRESSION: 1. Normal ultrasound appearance of the right kidney 2. Limited visibility of left kidney secondary to bowel gas and positioning. Simple appearing cyst upper pole left kidney. No hydronephrosis. 3. Cirrhotic morphology of the liver with small amount of ascites in the right upper quadrant. Left-sided pleural effusion. Electronically Signed   By: Donavan Foil M.D.   On:  10/04/2020 15:47   US Venous Img Lower Bilateral  Result Date: 09/28/2020 CLINICAL DATA:  79 year old female with bilateral lower extremity edema. EXAM: Bilateral LOWER EXTREMITY VENOUS DOPPLER ULTRASOUND TECHNIQUE: Gray-scale sonography with compression, as well as color and duplex ultrasound, were performed to evaluate the deep venous system(s) from the level of the common femoral vein through the popliteal and proximal calf veins. COMPARISON:  None. FINDINGS: VENOUS Normal compressibility of the common femoral, superficial femoral, and popliteal veins, as well as the visualized calf veins. Visualized portions of profunda femoral vein and great saphenous vein unremarkable. No filling defects to suggest DVT on grayscale or color Doppler imaging. Doppler waveforms show normal direction of venous flow, normal respiratory plasticity and response to augmentation. Limited views of the contralateral common femoral vein are unremarkable. OTHER None. Limitations: There is mild diffuse subcutaneous edema. No fluid collection. IMPRESSION: Negative. Electronically Signed   By: Anner Crete M.D.   On: 09/28/2020 18:40   US Venous Img Upper Uni Right(DVT)  Result Date: 10/18/2020 CLINICAL DATA:  Right upper extremity edema, warmth, redness EXAM: Choose 2 UPPER EXTREMITY VENOUS DOPPLER ULTRASOUND TECHNIQUE: Gray-scale sonography with graded compression, as well as color Doppler and duplex ultrasound were performed to evaluate the upper extremity deep venous system from the level of the subclavian vein and including the jugular, axillary, basilic, radial, ulnar and upper cephalic vein. Spectral Doppler was utilized to evaluate flow at rest and with distal augmentation maneuvers. COMPARISON:  None. FINDINGS: Contralateral Subclavian Vein: Respiratory phasicity is normal and symmetric with the symptomatic side. No evidence of thrombus. Normal compressibility. Internal Jugular Vein: No evidence of thrombus. Normal  compressibility, respiratory phasicity and response to augmentation. Subclavian Vein: No evidence of thrombus. Normal compressibility, respiratory phasicity and response to augmentation. Axillary Vein: No evidence of thrombus. Normal compressibility, respiratory phasicity and response to augmentation. Cephalic Vein: No evidence of thrombus. Normal compressibility, respiratory phasicity and response to augmentation. Basilic Vein: No evidence of thrombus. Normal compressibility, respiratory phasicity and response to augmentation. Brachial Veins: No evidence of  thrombus. Normal compressibility, respiratory phasicity and response to augmentation. Radial Veins: No evidence of thrombus. Normal compressibility, respiratory phasicity and response to augmentation. Ulnar Veins: No evidence of thrombus. Normal compressibility, respiratory phasicity and response to augmentation. Venous Reflux:  None visualized. Other Findings:  None visualized. IMPRESSION: No evidence of DVT within the right upper extremity. Electronically Signed   By: Randa Ngo M.D.   On: 10/18/2020 20:03   CT CHEST ABDOMEN PELVIS WO CONTRAST  Result Date: 10/14/2020 CLINICAL DATA:  Fall, low hemoglobin and hematocrit, bruising in the right upper back and right shoulder pain. History of fallopian tube malignancy noted during hysterectomy. History of CHF, COPD, CKD EXAM: CT CHEST, ABDOMEN AND PELVIS WITHOUT CONTRAST TECHNIQUE: Multidetector CT imaging of the chest, abdomen and pelvis was performed following the standard protocol without IV contrast. COMPARISON:  CT 01/16/2020, 11/16/2012, renal ultrasound 10/04/2020 FINDINGS: CT CHEST FINDINGS Cardiovascular: Limited assessment of the vasculature in the absence of contrast media. Cardiomegaly with predominantly right atrial enlargement. Hypoattenuation of cardiac blood pool compatible with anemia. Three-vessel coronary artery atherosclerosis. Trace pericardial effusion. Atherosclerotic plaque within  the normal caliber aorta. Shared origin of the brachiocephalic and left common carotid arteries. Calcification of the proximal great vessels as well. Central pulmonary arteries are normal caliber. Luminal assessment precluded. No major venous abnormalities are seen. Mediastinum/Nodes: No mediastinal fluid or gas. Normal thyroid gland and thoracic inlet. No acute abnormality of the trachea or esophagus. No worrisome mediastinal or axillary adenopathy. Hilar nodal evaluation is limited in the absence of intravenous contrast media. Lungs/Pleura: Low-attenuation bilateral pleural effusions. Adjacent areas of passive atelectasis. Mild airways thickening. Some additional patchy opacities are present in the partially atelectatic left lower lobe, underlying aspiration or airspace disease not excluded. No pneumothorax. Background of centrilobular predominant emphysematous changes. Clustered micro nodules are seen in the anterior segment left upper lobe and lingula, possibly infectious or inflammatory. No concerning pulmonary nodules or masses. Musculoskeletal: Soft tissue swelling and contusive changes along the right lateral chest wall. There is a lobular hyperdense collection along the right chest wall concerning for an actively bleeding hematoma in the setting of this noncontrast CT. This measures up to 3.9 cm in maximal thickness extending 14.5 cm in AP diameter and 17.5 cm in craniocaudal extent. Majority of this collection appears subjacent to the serve radius though there is loss of discernible fat plane suggesting a possible intramuscular source. Portions of the shoulders are collimated from view. There is questionable cortical step-off along the posterior aspect of the glenoid in the right shoulder seen on the first images of the sequences (2/1), and a glenoid injury is not fully excluded. Consider dedicated imaging. Additionally, sagittal reconstruction there is a mild cortical step-off along the superior aspect of  the manubrium albeit without adjacent soft tissue swelling thickening. Possibly related to step artifact though a minimally displaced fracture is certainly possible. Correlate point tenderness. No visible displaced rib fractures. Several remote appearing deformities of the left anterolateral lower thoracic ribs, several seen on comparison from March 2021. Multilevel degenerative changes are present in the imaged portions of the spine. Multilevel flowing anterior osteophytosis, compatible with features of diffuse idiopathic skeletal hyperostosis (DISH). Intraosseous hemangioma noted at T12. Additional degenerative changes in the shoulders. CT ABDOMEN PELVIS FINDINGS Hepatobiliary: Possible hypoattenuating lesion abutting the gallbladder fossa (2/66) measuring approximately 1.8 cm in size, incompletely characterized on this unenhanced CT with diminished imaging quality due to motion artifact. Hypertrophy of the left lobe liver with a nodular liver surface contour suggestive  of cirrhotic changes. Normal liver attenuation. Normal gallbladder distention. No visible calcified gallstones. Stable surgical clips near the gallbladder fundus. Pericholecystic fluid may reflect redistributed ascites. No biliary ductal dilatation. Pancreas: Partial fatty replacement of the pancreas. No pancreatic ductal dilatation or surrounding inflammatory changes. Spleen: Splenomegaly. No concerning focal splenic lesions. Capsular calcification along the posterior spleen (2/64) stable from prior may reflect sequela of prior injury. Adrenals/Urinary Tract: Macroscopic fat containing lesion in the medial limb of the left adrenal gland suggestive of myelolipoma while a more intermediate to low-attenuation lesion in the body could reflect small adrenal adenoma. Overall stable appearance from prior accounting for motion artifact and differences in technique. No concerning right adrenal lesion. Redemonstration of a 4.5 cm fluid attenuation cyst in  the upper pole left kidney, not significantly changed from prior. Stable mild bilateral perinephric stranding is nonspecific can be seen with advanced age or diminished renal function. No new concerning focal renal lesion. No urolithiasis or hydronephrosis. Urinary bladder is unremarkable for the degree of distention. Stomach/Bowel: Distal esophagus, stomach and duodenal sweep are unremarkable. No small bowel wall thickening or dilatation. No evidence of obstruction. Appendix is not visualized. No focal inflammation the vicinity of the cecum to suggest an occult appendicitis. Cecum displaced to the midline pelvis without frank volvulus. Moderate colonic stool burden. No colonic dilatation or wall thickening. Scattered colonic diverticula without focal inflammation to suggest diverticulitis. Vascular/Lymphatic: No worrisome abdominopelvic adenopathy. Multiple surgical clips in the retroperitoneum likely reflecting prior nodal dissection. Extensive atherosclerotic calcification is similar to prior. Luminal evaluation severely limited in the absence of contrast media. Reproductive: Uterus is surgically absent. No concerning adnexal lesions. No concerning adnexal lesions are identified. Other: Extensive circumferential body wall edema. More focal soft tissue thickening of the right chest wall and flank may reflect some additional contusive change in the setting of recent fall. No traumatic abdominal wall dehiscence. No bowel containing hernias. Postsurgical changes from low vertical midline incision. Musculoskeletal: No acute fracture or traumatic osseous injury of the lumbar spine or bony pelvis. Proximal femora are intact and normally located. Unchanged grade 1 anterolisthesis L4 on 5. Multilevel degenerative changes are present in the imaged portions of the spine. Additional degenerative changes throughout the hips and pelvis. No worrisome osseous lesions. IMPRESSION: Traumatic Findings: 1. Contusive changes along  the right posterolateral chest wall and flank and lateral right breast soft tissues. Larger heterogeneous, hyperdense chest wall hematoma which appears subjacent to or possibly arising from the serrated is musculature. Concerning for acute hemorrhage in the setting of declining hemoglobin. No visible displaced rib fractures though bony demineralization may detection of subtle nondisplaced fracture. 2. Possible fracture along the posterior right glenoid, incompletely imaged on this exam. Consider dedicated right shoulder imaging. 3. Additional questionable cortical step-off of the superior manubrium albeit without adjacent soft tissue thickening or stranding. Correlate for point tenderness to assess for nondisplaced fracture. 4. Unenhanced CT was performed per clinician order. Lack of IV contrast limits sensitivity and specificity, especially for evaluation of abdominal/pelvic solid viscera. 5. Ill-defined questionable hypoattenuating 1.8 cm lesion adjacent the gallbladder fossa. A rounded configuration is less suggestive of a acute traumatic liver lesion though not fully excluded. Regardless, at minimum further assessment with outpatient MRI should be considered given underlying intrinsic liver disease/cirrhosis. Nontraumatic Findings: 1. Features of volume overload/anasarca with diffuse body wall edema, bilateral pleural effusions, ascites. Possibly related to a chronic history of CHF or underlying cirrhosis. 2. Passive atelectatic changes adjacent the pleural effusions with some more patchy opacity  in the left lung base and micro nodularity in the left upper lobe and lingula which could reflect a superimposed infectious or inflammatory process. 3. Stigmata of cirrhosis include a nodular liver, left lobe hypertrophy, and splenomegaly. 4. 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 5. Mild bilateral perinephric stranding,  can reflect diminished renal function or senescent change though could correlate for urinary symptoms and consider urinalysis as appropriate. 6. Postsurgical changes from prior retroperitoneal nodal dissection. 7. Prior hysterectomy. 8. Cardiomegaly, three-vessel coronary artery atherosclerosis. 9.  Aortic Atherosclerosis (ICD10-I70.0). 10.  Emphysema (ICD10-J43.9). These results were called by telephone at the time of interpretation on 10/14/2020 at 5:44 pm to provider [facility] , who verbally acknowledged these results. Electronically Signed   By: Lovena Le M.D.   On: 10/14/2020 17:47    Microbiology: Recent Results (from the past 240 hour(s))  Resp Panel by RT-PCR (Flu A&B, Covid) Nasopharyngeal Swab     Status: None   Collection Time: 10/14/20  8:32 PM   Specimen: Nasopharyngeal Swab; Nasopharyngeal(NP) swabs in vial transport medium  Result Value Ref Range Status   SARS Coronavirus 2 by RT PCR NEGATIVE NEGATIVE Final    Comment: (NOTE) SARS-CoV-2 target nucleic acids are NOT DETECTED.  The SARS-CoV-2 RNA is generally detectable in upper respiratory specimens during the acute phase of infection. The lowest concentration of SARS-CoV-2 viral copies this assay can detect is 138 copies/mL. A negative result does not preclude SARS-Cov-2 infection and should not be used as the sole basis for treatment or other patient management decisions. A negative result may occur with  improper specimen collection/handling, submission of specimen other than nasopharyngeal swab, presence of viral mutation(s) within the areas targeted by this assay, and inadequate number of viral copies(<138 copies/mL). A negative result must be combined with clinical observations, patient history, and epidemiological information. The expected result is Negative.  Fact Sheet for Patients:  EntrepreneurPulse.com.au  Fact Sheet for Healthcare Providers:    IncredibleEmployment.be  This test is no t yet approved or cleared by the [place] FDA and  has been authorized for detection and/or diagnosis of SARS-CoV-2 by FDA under an Emergency Use Authorization (EUA). This EUA will remain  in effect (meaning this test can be used) for the duration of the COVID-19 declaration under Section 564(b)(1) of the Act, 21 U.S.C.section 360bbb-3(b)(1), unless the authorization is terminated  or revoked sooner.       Influenza A by PCR NEGATIVE NEGATIVE Final   Influenza B by PCR NEGATIVE NEGATIVE Final    Comment: (NOTE) The Xpert Xpress SARS-CoV-2/FLU/RSV plus assay is intended as an aid in the diagnosis of influenza from Nasopharyngeal swab specimens and should not be used as a sole basis for treatment. Nasal washings and aspirates are unacceptable for Xpert Xpress SARS-CoV-2/FLU/RSV testing.  Fact Sheet for Patients: EntrepreneurPulse.com.au  Fact Sheet for Healthcare Providers: IncredibleEmployment.be  This test is not yet approved or cleared by the [place] FDA and has been authorized for detection and/or diagnosis of SARS-CoV-2 by FDA under an Emergency Use Authorization (EUA). This EUA will remain in effect (meaning this test can be used) for the duration of the COVID-19 declaration under Section 564(b)(1) of the Act, 21 U.S.C. section 360bbb-3(b)(1), unless the authorization is terminated or revoked.  Performed at [facility], [address] [place], [place] [address]   SARS Coronavirus 2 by RT PCR (hospital order, performed in [place]  lab) Nasopharyngeal Nasopharyngeal Swab     Status: None   Collection Time: 10/19/20 12:50 PM   Specimen: Nasopharyngeal Swab  Result Value Ref Range Status   SARS Coronavirus 2 NEGATIVE NEGATIVE Final    Comment: (NOTE) SARS-CoV-2 target nucleic acids are NOT DETECTED.  The SARS-CoV-2 RNA is generally detectable  in upper and lower respiratory specimens during the acute phase of infection. The lowest concentration of SARS-CoV-2 viral copies this assay can detect is 250 copies / mL. A negative result does not preclude SARS-CoV-2 infection and should not be used as the sole basis for treatment or other patient management decisions.  A negative result may occur with improper specimen collection / handling, submission of specimen other than nasopharyngeal swab, presence of viral mutation(s) within the areas targeted by this assay, and inadequate number of viral copies (<250 copies / mL). A negative result must be combined with clinical observations, patient history, and epidemiological information.  Fact Sheet for Patients:   StrictlyIdeas.no  Fact Sheet for Healthcare Providers: BankingDealers.co.za  This test is not yet approved or  cleared by the [place] FDA and has been authorized for detection and/or diagnosis of SARS-CoV-2 by FDA under an Emergency Use Authorization (EUA).  This EUA will remain in effect (meaning this test can be used) for the duration of the COVID-19 declaration under Section 564(b)(1) of the Act, 21 U.S.C. section 360bbb-3(b)(1), unless the authorization is terminated or revoked sooner.  Performed at [facility], [address] [place], [place] [address]      Labs: Basic Metabolic Panel: Recent Labs  Lab 10/14/20 1330 10/15/20 0420 10/16/20 0337 10/17/20 0414 10/18/20 0437  NA 138 139 139 137 140  K 5.6* 4.7 4.7 4.7 4.2  CL 103 105 101 101 102  CO2 27 25 27 27 28   GLUCOSE 99 104* 121* 115* 107*  BUN 32* 30* 34* 34* 30*  CREATININE 1.82* 1.52* 1.50* 1.23* 1.03*  CALCIUM 9.5 8.7* 9.4 9.2 9.3  MG  --   --  1.9  --   --   PHOS  --   --  4.0  --   --    Liver Function Tests: Recent Labs  Lab 10/14/20 0932 10/14/20 1330 10/17/20 0414  AST 24 25 22   ALT 17 18 14   ALKPHOS 63 62 68  BILITOT  0.9 1.1 1.4*  PROT 5.8* 6.1* 5.5*  ALBUMIN 3.0* 3.2* 3.0*   No results for input(s): LIPASE, AMYLASE in the last 168 hours. No results for input(s): AMMONIA in the last 168 hours. CBC: Recent Labs  Lab 10/14/20 0932 10/14/20 0932 10/14/20 1330 10/14/20 1330 10/15/20 0420 10/15/20 0420 10/16/20 0337 10/16/20 1602 10/16/20 2315 10/17/20 0414 10/18/20 0437  WBC 13.8*   < > 13.5*  --  11.1*  --  11.1*  --   --  11.3* 10.9*  NEUTROABS 11.8*  --  11.6*  --   --   --   --   --   --   --   --   HGB 6.9*   < > 7.1*   < > 7.2*   < > 8.9* 8.5* 8.5* 8.7* 8.7*  HCT 22.3*   < > 23.0*   < > 23.1*   < > 28.1* 27.2* 27.4* 27.1* 28.3*  MCV 93.3   < > 93.9  --  94.3  --  92.7  --   --  91.6 94.6  PLT 367   < > 384  --  305  --  358  --   --  334 327   < > = values in this interval not displayed.   Cardiac Enzymes: No results for input(s): CKTOTAL, CKMB, CKMBINDEX, TROPONINI in the last 168 hours. BNP: BNP (last 3 results) Recent Labs    06/11/20 1645  BNP 355.2*    ProBNP (last 3 results) No results for input(s): PROBNP in the last 8760 hours.  CBG: Recent Labs  Lab 10/18/20 1219 10/18/20 1555 10/18/20 2137 10/19/20 0808 10/19/20 1147  GLUCAP 129* 107* 112* 142* 107*    Principal Problem:   Acute blood loss anemia Active Problems:   COPD (chronic obstructive pulmonary disease) (HCC)   Chronic kidney disease, stage 3a   Hypertension   OSA on CPAP   Seizure disorder (HCC)   Polycythemia rubra vera (HCC)   Other persistent atrial fibrillation (HCC)   Chronic kidney disease   Hyperkalemia   Hematoma of right chest wall   Chronic respiratory failure (HCC)   Symptomatic anemia   Trauma   Cirrhosis of liver with ascites ([place])   Time coordinating discharge: 38 minutes.  Signed:        Tyler Robidoux, DO [facility]  10/19/2020, 2:37 PM

## 2020-10-19 NOTE — Care Management Important Message (Signed)
Important Message  Patient Details  Name: Angelica Juarez MRN: [id number] Date of Birth: 1940/12/25   Medicare Important Message Given:  Yes     Dannette Barbara 10/19/2020, 1:21 PM

## 2020-10-19 NOTE — TOC Progression Note (Signed)
Transition of Care [facility]) - Progression Note    Patient Details  Name: Angelica Juarez MRN: [id number] Date of Birth: 10-22-1941  Transition [facility]) CM/SW [place], LCSW Phone Number: 10/19/2020, 9:35 AM  Clinical Narrative: [facility] admissions coordinator confirmed they can accept patient back today. MD is aware and ordered a COVID test.    Expected Discharge Plan: [place] Barriers to Discharge: Continued Medical Work up  Expected Discharge Plan and Services Expected Discharge Plan: [place] Acute Care Choice: Resumption of Svcs/PTA Provider Living arrangements for the past 2 months: Single Family Home, Skilled Nursing Facility                                       Social Determinants of Health (SDOH) Interventions    Readmission Risk Interventions Readmission Risk Prevention Plan 10/06/2020 09/30/2020 06/03/2019  Transportation Screening - Complete Complete  PCP or Specialist Appt within 5-7 Days - - Complete  Home Care Screening - - Complete  Medication Review (RN CM) - - Complete  HRI or Home Care Consult - Complete -  Palliative Care Screening - Not Applicable -  Medication Review (RN Care Manager) - Complete -  [place] or Home Care Consult Complete - -  Palliative Care Screening Not Applicable - -  Skilled Nursing Facility Complete - -  Some recent data might be hidden

## 2020-10-22 ENCOUNTER — Inpatient Hospital Stay: Payer: Medicare Other | Admitting: Infectious Diseases

## 2020-10-22 ENCOUNTER — Ambulatory Visit: Payer: Medicare Other | Attending: Infectious Diseases | Admitting: Infectious Diseases

## 2020-10-22 DIAGNOSIS — L308 Other specified dermatitis: Secondary | ICD-10-CM | POA: Diagnosis not present

## 2020-10-22 DIAGNOSIS — S81802D Unspecified open wound, left lower leg, subsequent encounter: Secondary | ICD-10-CM

## 2020-10-22 NOTE — Progress Notes (Signed)
The purpose of this virtual visit is to provide medical care while limiting exposure to the novel coronavirus (COVID19) for  patient    Consent was obtained for TELEMED  visit:  Yes.   Answered questions that patient had about telehealth interaction:  Yes.   I discussed the limitations, risks, security and privacy concerns of performing an evaluation and management service by telephone. I also discussed with the patient that there may be a patient responsible charge related to this service. The patient expressed understanding and agreed to proceed.   Patient Location: [facility] Provider Location:office participants in the visit Dr.Donnajean Chesnut Patient and her nurse Shante at [facility] Follow up leg wound 79 yr female with Polycythemia CKD, COPD wa [facility] the hopsital between 11/15-11/23 with left leg wound -I had seen her during that visit  it was thought to be due to Possible drug allergy- to lamictal with secondary infection - bacteria cultured was enterobacter and klebsiella  Lamictal was tapered and stopped She was treated with IV  zosyn and then  sent to NH on bactrim for 5 days which she completed long time ago She had a biopsy of the lesion on her back by [facility] dermatologist and the report was Diagnosis 1. Skin , back PERIVASCULAR DERMATITIS WITH EOSINOPHILS, SEE DESCRIPTION 2. Direct Immunofluorescence, back NEGATIVE FOR IMMUNOREACTANTS 1- most consistent with Urticaria (hives); vs bite reaction vs Drug reaction. In this pt, Drug reaction most likely with bite reaction possibility in this specific location. 2- Negative DIF - this makes it LESS likely that this rash represents a primary blistering disease such as Bullous Pemphigoid She was back in the hospital 12/1-12/6 after a fall and hematoma of the chest wall- I did not see her during that visit  Pt says she is doing much better now pts leg wound was seen by video and it looks much better One area of superficial  scab- which is being covered by medihoney and kerlex I was not able to see the  chronic eczematous rash on her upper back due to technical issues with the video call I told the Nursing staff to ask their provider to look at it and advise management PT need not follow up with me any more. Total time spent 15 min

## 2020-10-26 ENCOUNTER — Ambulatory Visit: Payer: Medicare Other | Admitting: Dermatology

## 2020-10-27 ENCOUNTER — Telehealth: Payer: Self-pay

## 2020-10-27 NOTE — Telephone Encounter (Signed)
-----   Message from Ralene Bathe, MD sent at 10/13/2020  8:07 AM EST ----- Diagnosis 1. Skin , back PERIVASCULAR DERMATITIS WITH EOSINOPHILS, SEE DESCRIPTION 2. Direct Immunofluorescence, back NEGATIVE FOR IMMUNOREACTANTS  1- most consistent with Urticaria (hives);  vs bite reaction vs Drug reaction.  In this pt, Drug reaction most likely with bite reaction possibility in this specific location. 2- Negative DIF - this makes it LESS likely that this rash represents a primary blistering disease such as Bullous Pemphigoid.

## 2020-10-27 NOTE — Telephone Encounter (Signed)
Voicemail box full, unable to leave a message. 

## 2020-10-29 ENCOUNTER — Encounter: Payer: Self-pay | Admitting: Infectious Diseases

## 2020-11-03 ENCOUNTER — Inpatient Hospital Stay: Payer: Medicare Other | Admitting: Infectious Diseases

## 2020-11-03 ENCOUNTER — Telehealth: Payer: Self-pay

## 2020-11-03 NOTE — Telephone Encounter (Signed)
-----   Message from David C Kowalski, MD sent at 10/13/2020  8:07 AM EST ----- Diagnosis 1. Skin , back PERIVASCULAR DERMATITIS WITH EOSINOPHILS, SEE DESCRIPTION 2. Direct Immunofluorescence, back NEGATIVE FOR IMMUNOREACTANTS  1- most consistent with Urticaria (hives);  vs bite reaction vs Drug reaction.  In this pt, Drug reaction most likely with bite reaction possibility in this specific location. 2- Negative DIF - this makes it LESS likely that this rash represents a primary blistering disease such as Bullous Pemphigoid. 

## 2020-11-03 NOTE — Telephone Encounter (Signed)
Unable to leave a message. Voicemail box full.

## 2020-11-10 NOTE — Progress Notes (Incomplete)
[facility]  [address] [place],  [address] Phone: [phone]  Fax: [phone]   Clinic Day:  11/10/2020  Referring physician: Sofie Hartigan, MD  Chief Complaint: Angelica Juarez is a 79 y.o. 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   HPI: The patient was last seen in the hematology clinic on 10/14/2020. At that time, she was fatigued.  She took a fall at rehab and had marked deep ecchymosis on her right side.  She denied any other bleeding. Hematocrit was 22.3, hemoglobin 6.9, platelets 367,000, WBC 13,800. Prior hemoglobin was 9.7.  Creatinine was 1.88 (CrCl 27 ml/min). Potassium was 5.3. She was transported to the [facility] ER.  She was admitted to [facility] from 10/14/2020 - 10/19/2020. CT revealed a chest wall hematoma but no evidence of retroperitoneal hemorrhage.There was a nodular liver c/w cirrhosis and splenomegaly. There was a1.8 cm ill defined hypoattenuating lesion in the gallbladder fossa that will need follow up with MRI as outpatient. Her hemoglobin stabilized at 8.7 after she received 2 units of PRBCs. She remained on Eliquis.  She was discharged to a skilled nursing facility and was to follow up with oncology, pulmonology, and her PCP as outpatient.  During the interim, ***   Past Medical History:  Diagnosis Date  . Allergy    Seasonal  . Arthritis   . 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 Chronic kidney disease    chronic renal insufficiency  . Colon polyps 05/21/14  . COPD (chronic obstructive pulmonary disease) ([place])   . Diabetes mellitus without complication ([place])   . Dyspnea   . Fracture closed, humerus, shaft    right   . History of kidney stones    40 years ago  . Hyperlipidemia 04/30/14  . Hypertension   . Impingement syndrome of right shoulder 12/29/15  . Personal history of radiation therapy   . Pneumonia    hx  . Rotator cuff tear 04/21/16   right  . Seizures ([place])    . Sleep apnea     Past Surgical History:  Procedure Laterality Date  . ABDOMINAL HYSTERECTOMY    . COLONOSCOPY WITH PROPOFOL N/A 06/01/2020   Procedure: COLONOSCOPY WITH PROPOFOL;  Surgeon: Lin Landsman, MD;  Location: [facility] ENDOSCOPY;  Service: Gastroenterology;  Laterality: N/A;  . ESOPHAGOGASTRODUODENOSCOPY (EGD) WITH PROPOFOL N/A 06/01/2020   Procedure: ESOPHAGOGASTRODUODENOSCOPY (EGD) WITH PROPOFOL;  Surgeon: Lin Landsman, MD;  Location: [facility] ENDOSCOPY;  Service: Gastroenterology;  Laterality: N/A;  . 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 EYE SURGERY     bilateral cataracts  . JOINT REPLACEMENT     bilateral knee replacement  . NASAL SEPTUM SURGERY    . OOPHORECTOMY    . REPLACEMENT TOTAL KNEE Bilateral 2004  . SHOULDER ARTHROSCOPY WITH BICEPSTENOTOMY Right 05/25/2016   Procedure: SHOULDER ARTHROSCOPY WITH BICEPSTENOTOMY;  Surgeon: Leanor Kail, MD;  Location: [facility] ORS;  Service: Orthopedics;  Laterality: Right;  . SHOULDER ARTHROSCOPY WITH DISTAL CLAVICLE RESECTION Right 05/25/2016   Procedure: SHOULDER ARTHROSCOPY WITH DISTAL CLAVICLE RESECTION;  Surgeon: Leanor Kail, MD;  Location: [facility] ORS;  Service: Orthopedics;  Laterality: Right;  . SHOULDER ARTHROSCOPY WITH OPEN ROTATOR CUFF REPAIR Right 05/25/2016   Procedure: SHOULDER ARTHROSCOPY WITH OPEN ROTATOR CUFF REPAIR;  Surgeon: Leanor Kail, MD;  Location: [facility] ORS;  Service: Orthopedics;  Laterality: Right;  . SUBACROMIAL DECOMPRESSION Right 05/25/2016   Procedure: SUBACROMIAL DECOMPRESSION;  Surgeon: Erin Sons, MD;  Location: [facility] ORS;  Service: Orthopedics;  Laterality: Right;    Family History  Problem Relation Age of Onset  . Breast cancer Paternal Aunt 18  . Diabetes Father   . Heart disease Father   . Breast cancer Daughter 63    Social History:  reports that she quit smoking about 15 years ago. She has a 40.00 pack-year smoking history. She has never used smokeless tobacco.  She reports that she does not drink alcohol and does not use drugs. Patient moved to [place] in 2001 from [place]. Patient is a former 1 ppd smoker x 40 years; quit in 2006. She is retired from [employer]. Patient denies known exposures to radiation on toxins. Her significant other is [employer]. She lives in [place]. She lost her daughter to a heart attack last month. The patient is alone*** today.   Allergies:  Allergies  Allergen Reactions  . Iodinated Diagnostic Agents Anaphylaxis    Other reaction(s): Other (See Comments) Throat swells and extreme hives  . Lamictal [Lamotrigine] Rash    Suspected drug rash 09/2020  . Latex Itching  . Phenobarbital Hives  . Tape Rash    silicones    Current Medications: Current Outpatient Medications  Medication Sig Dispense Refill  . albuterol (ACCUNEB) 1.25 MG/3ML nebulizer solution Take 3 mLs (1.25 mg total) by nebulization every 6 (six) hours as needed. wheezing 75 mL 3  . atorvastatin (LIPITOR) 40 MG tablet Take 40 mg by mouth daily.    . diphenhydrAMINE (BENADRYL) 25 mg capsule Take 25 mg by mouth daily.    . fluticasone (FLONASE) 50 MCG/ACT nasal spray Place 2 sprays into both nostrils daily. 16 g 2  . furosemide (LASIX) 40 MG tablet Take 1 tablet (40 mg total) by mouth daily. 7 tablet 0  . hydroxyurea (HYDREA) 500 MG capsule TAKE ONE CAPSULE BY MOUTH TWICE DAILY ON MONDAYS ONLY, 1 CAPSULE DAILY ON TUESDAY TO SUNDAY. MAY TAKE WITH FOOD TO MINIMIZE GI SIDE EFFECTS 100 capsule 1  . levETIRAcetam (KEPPRA) 500 MG tablet Take 500 mg by mouth 2 (two) times daily.    . metFORMIN (GLUCOPHAGE) 500 MG tablet Take 500 mg by mouth 2 (two) times daily with a meal.    . montelukast (SINGULAIR) 10 MG tablet TAKE 1 TABLET BY MOUTH EVERY DAY (Patient taking differently: Take 10 mg by mouth at bedtime. ) 90 tablet 0  . Multiple Vitamin (MULTI-VITAMINS) TABS Take 1 tablet by mouth daily.    . OXYGEN Inhale 2 L into the lungs.    . potassium chloride SA (KLOR-CON)  20 MEQ tablet Take 20 mEq by mouth daily.    . sitaGLIPtin (JANUVIA) 25 MG tablet Take 1 tablet by mouth daily.    . traZODone (DESYREL) 100 MG tablet Take by mouth.    . umeclidinium-vilanterol (ANORO ELLIPTA) 62.5-25 MCG/INH AEPB Inhale 1 puff into the lungs daily. 180 each 3  . valsartan-hydrochlorothiazide (DIOVAN-HCT) 320-25 MG tablet Take 1 tablet by mouth daily.  0  . VENTOLIN HFA 108 (90 Base) MCG/ACT inhaler INHALE 2 PUFFS INTO THE LUNGS EVERY 6 HOURS AS NEEDED (Patient taking differently: Inhale 2 puffs into the lungs every 6 (six) hours as needed for wheezing or shortness of breath. ) 54 g 1   No current facility-administered medications for this visit.    Review of Systems  Constitutional: Positive for weight loss (4 lbs). Negative for chills, diaphoresis, fever and malaise/fatigue.       Feels "  tired."  HENT: Negative.  Negative for congestion, ear discharge, ear pain, hearing loss, nosebleeds, sinus pain, sore throat and tinnitus.        Mouth is dry.  Eyes: Negative.  Negative for blurred vision.       Macular degeneration left eye, receives injections.  Respiratory: Positive for shortness of breath (on exertion). Negative for cough, hemoptysis and sputum production.        Sleep apnea - uses CPAP. COPD on 2 liters/min of oxygen.  Cardiovascular: Negative for chest pain, palpitations and leg swelling.  Gastrointestinal: Negative for abdominal pain, blood in stool, constipation, diarrhea, heartburn, melena, nausea and vomiting.  Genitourinary: Negative.  Negative for dysuria, frequency, hematuria and urgency.  Musculoskeletal: Negative for back pain (DDD), falls, joint pain (OA in shoulders), myalgias and neck pain.       B/L foot pain.  Skin: Positive for rash (upper back and neck). Negative for itching.       Large bruise on right side  Neurological: Negative for dizziness, tremors, sensory change, speech change, weakness and headaches.  Endo/Heme/Allergies: Bruises/bleeds  easily (bruising on arms).  Psychiatric/Behavioral: Negative for depression and memory loss. The patient has insomnia. The patient is not nervous/anxious.   All other systems reviewed and are negative.  Performance status (ECOG): 3***  Vitals There were no vitals taken for this visit.  Physical Exam Vitals and nursing note reviewed.  Constitutional:      General: She is not in acute distress.    Appearance: She is well-developed. She is not diaphoretic.     Comments: Fatigued appearing woman sitting comfortably in a wheelchair in no acute distress.  HENT:     Head: Normocephalic and atraumatic.     Comments: Short gray hair.    Mouth/Throat:     Mouth: Mucous membranes are moist.     Pharynx: Oropharynx is clear.  Eyes:     General: No scleral icterus.    Extraocular Movements: Extraocular movements intact.     Conjunctiva/sclera: Conjunctivae normal.     Pupils: Pupils are equal, round, and reactive to light.     Comments: Glasses.  Brown eyes.  Cardiovascular:     Rate and Rhythm: Normal rate and regular rhythm.     Heart sounds: Murmur (flow) heard.    Pulmonary:     Effort: Pulmonary effort is normal. No respiratory distress.  Chest:     Chest wall: No tenderness.  Breasts:     Right: No axillary adenopathy or supraclavicular adenopathy.     Left: No axillary adenopathy or supraclavicular adenopathy.    Abdominal:     General: Bowel sounds are normal. There is no distension.     Palpations: Abdomen is soft. There is no hepatomegaly, splenomegaly or mass.     Tenderness: There is no abdominal tenderness. There is no guarding or rebound.  Musculoskeletal:        General: Tenderness (left foot) present. No swelling. Normal range of motion.     Cervical back: Normal range of motion and neck supple.     Right lower leg: Edema present.     Left lower leg: Edema (L>R, extends above knees) present.  Lymphadenopathy:     Head:     Right side of head: No preauricular,  posterior auricular or occipital adenopathy.     Left side of head: No preauricular, posterior auricular or occipital adenopathy.     Cervical: No cervical adenopathy.     Upper Body:  Right upper body: No supraclavicular or axillary adenopathy.     Left upper body: No supraclavicular or axillary adenopathy.     Lower Body: No right inguinal adenopathy. No left inguinal adenopathy.  Skin:    General: Skin is warm and dry.     Comments: Extensive seep ecchymosis on right side (see photos).  Neurological:     Mental Status: She is alert and oriented to person, place, and time.  Psychiatric:        Behavior: Behavior normal.        Thought Content: Thought content normal.        Judgment: Judgment normal.     10/14/2020:  Right side (anterior, lateral, posterior)    No visits with results within 3 Day(s) from this visit.  Latest known visit with results is:  No results displayed because visit has over 200 results.      Assessment:  TASHAE GLENNY is a 79 y.o. female withpolycythemia rubra vera.She has had sleep apnea x 7 years and uses CPAP. She has a 40 pack year smoking history (stopped 13 years ago). She denies and cardiac history.  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 There was no significant dysplasia or increased blasts. Overall features were c/w polycythemia rubra vera.  Work up on 08/08/2018 revealed a WBC of 16,700 ([place] 13,800). Hemoglobin 15.3, hematocrit 45.5, MCV 87.9, and platelets 552,000. Erythropoietin level was normal at 2.9 mIU/mL. BCR/ABL demonstrated no BCR or ABL gene rearrangements. Carbon monoxide level was normal at 3.0% (0-3.6 %). 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   She began a phlebotomy programon 09/07/2018 (last04/30/2020). Hematocrit goalis <=42.  She began hydroxyurea500 mg a day on 09/12/2018.  She is taking hydroxyurea 1000 mg on Mondays and 500 mg 6  days/week.  She was diagnosed with C difficile + diarrheaon 10/10/2018; completed 10 day course of oral vancomycin. 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  Colonoscopy on 06/01/2020 revealed erythematous mucosa in the descending, transverse, and ascending colon and diverticulosis in the sigmoid colon. There were non-bleeding external hemorrhoids. Four polyps were removed (tubular adenomas). Pathology revealed diffuse microscopic colitis.  She was admitted to [facility] from 06/11/2020 - 06/13/2020 for COPD exacerbation and acute on chronic heart failure. Echocardiogram revealed an EF of 50-55%. She received Lasix.  She was admitted to [facility] from 09/28/2020 - 10/06/2020 with left leg swelling and blisters. Wound culture revealed enterobacter and klebsiella. Bilateral lower extremity duplex was negative. She was treated with IV antibiotics discharged on Bactrim.  She was admitted to [facility] from 10/14/2020 - 10/19/2020 with symptomatic anemia s/p fall. Chest, abdomen and pelvis CT revealed a chest wall hematoma but no evidence of retroperitoneal hemorrhage.There was a nodular liver c/w cirrhosis and splenomegaly. There was a 1.8 cm ill defined hypoattenuating lesion in the gallbladder fossa that will need follow up with MRI as outpatient. Her hemoglobin stabilized at 8.7 after she received 2 units of PRBCs. She remained on Eliquis.   She received her first COVID-19 vaccine on 02/28/2020 and last COVID-19 vaccine 2 weeks ago  Symptomatically, ***  Plan: 1.   Labs today: CBC with diff, CMP, ferritin, iron studies, hold tube.   2.Polycythemia rubra vera Clinically, she is doing well. Hematocrit22.3. Hemoglobin6.9.Platelets367,000. Hematocrit goal <= 42. Platelet goal< 400,000. Anemia secondary to bleed. Continue hydroxyurea 1000 mg on Mondays and 500 mg Tuesday -  Sundays. Monitor  closely secondary to acute drop in hemoglobin. 3.   Normocytic anemia  Hematocrit 31.2.  Hemoglobin 9.7.  MCV 90.2 on 10/06/2020.  Hematocrit22.3. Hemoglobin6.9. MCV 93.3 today.  Acute drop in hemoglobin likely related to fall on Eliquis.   Patient has marked deep ecchymosis.   Possible retroperitoneal hemorrhage.    Consider non-contrast abdomen and pelvis CT.  Patient denies any melena, hematochezia, hematuria or vaginal bleeding.  Previously she had macrocytosis secondary to hydroxyurea.  RBCs normocytic likely secondary to iron deficiency.   Plan additional labs (ferritin, iron studies, sed rate/CRP).  Anticipate transfusion irradiated PRBCs secondary to acute blood loss. 4.   Hyperkalemia  Patient on oral potassium.  Hold potassium and follow-up labs. 5.   Transport patient to [facility] ER for evaluation of acute anemia. 6.   RTC in 1 month for MD assessment and labs (CBC with diff, CMP).   I discussed the assessment and treatment plan with the patient.  The patient was provided an opportunity to ask questions and all were answered.  The patient agreed with the plan and demonstrated an understanding of the instructions.  The patient was advised to call back if the symptoms worsen or if the condition fails to improve as anticipated.  I provided *** minutes of face-to-face time during this this encounter and > 50% was spent counseling as documented under my assessment and plan.  Lequita Asal, MD, PhD    11/10/2020, 3:06 PM  I, Mirian Mo Tufford, am acting as Education administrator for [employer]. Mike Gip, MD, PhD.  I, Melissa C. Mike Gip, MD, have reviewed the above documentation for accuracy and completeness, and I agree with the above.

## 2020-11-11 ENCOUNTER — Inpatient Hospital Stay: Payer: Medicare Other | Admitting: Hematology and Oncology

## 2020-11-11 ENCOUNTER — Other Ambulatory Visit: Payer: Self-pay | Admitting: Hematology and Oncology

## 2020-11-11 ENCOUNTER — Inpatient Hospital Stay: Payer: Medicare Other

## 2020-11-11 DIAGNOSIS — D649 Anemia, unspecified: Secondary | ICD-10-CM

## 2020-12-08 ENCOUNTER — Telehealth: Payer: Self-pay

## 2020-12-08 NOTE — Telephone Encounter (Signed)
Mailbox is full, unable to leave voice message. Pt needs to schedule pulm appt for cpap supplies.

## 2020-12-22 ENCOUNTER — Telehealth: Payer: Self-pay

## 2020-12-22 NOTE — Telephone Encounter (Signed)
Unable to [facility] pt needs an appt. For Pulm

## 2020-12-23 ENCOUNTER — Encounter: Payer: Self-pay | Admitting: Emergency Medicine

## 2020-12-23 ENCOUNTER — Inpatient Hospital Stay: Admit: 2020-12-23 | Payer: Medicare Other

## 2020-12-23 ENCOUNTER — Other Ambulatory Visit: Payer: Self-pay

## 2020-12-23 ENCOUNTER — Emergency Department: Payer: Medicare Other

## 2020-12-23 ENCOUNTER — Inpatient Hospital Stay
Admission: EM | Admit: 2020-12-23 | Discharge: 2021-01-12 | DRG: 870 | Disposition: E | Payer: Medicare Other | Attending: Internal Medicine | Admitting: Internal Medicine

## 2020-12-23 DIAGNOSIS — R6521 Severe sepsis with septic shock: Secondary | ICD-10-CM | POA: Diagnosis present

## 2020-12-23 DIAGNOSIS — R809 Proteinuria, unspecified: Secondary | ICD-10-CM | POA: Diagnosis present

## 2020-12-23 DIAGNOSIS — E7220 Disorder of urea cycle metabolism, unspecified: Secondary | ICD-10-CM | POA: Diagnosis present

## 2020-12-23 DIAGNOSIS — Z515 Encounter for palliative care: Secondary | ICD-10-CM | POA: Diagnosis not present

## 2020-12-23 DIAGNOSIS — E785 Hyperlipidemia, unspecified: Secondary | ICD-10-CM | POA: Diagnosis present

## 2020-12-23 DIAGNOSIS — Z8589 Personal history of malignant neoplasm of other organs and systems: Secondary | ICD-10-CM

## 2020-12-23 DIAGNOSIS — Z923 Personal history of irradiation: Secondary | ICD-10-CM

## 2020-12-23 DIAGNOSIS — N170 Acute kidney failure with tubular necrosis: Secondary | ICD-10-CM | POA: Diagnosis present

## 2020-12-23 DIAGNOSIS — I4819 Other persistent atrial fibrillation: Secondary | ICD-10-CM | POA: Diagnosis present

## 2020-12-23 DIAGNOSIS — I130 Hypertensive heart and chronic kidney disease with heart failure and stage 1 through stage 4 chronic kidney disease, or unspecified chronic kidney disease: Secondary | ICD-10-CM | POA: Diagnosis present

## 2020-12-23 DIAGNOSIS — Z7189 Other specified counseling: Secondary | ICD-10-CM | POA: Diagnosis not present

## 2020-12-23 DIAGNOSIS — D631 Anemia in chronic kidney disease: Secondary | ICD-10-CM | POA: Diagnosis present

## 2020-12-23 DIAGNOSIS — K767 Hepatorenal syndrome: Secondary | ICD-10-CM | POA: Diagnosis present

## 2020-12-23 DIAGNOSIS — J189 Pneumonia, unspecified organism: Secondary | ICD-10-CM | POA: Diagnosis present

## 2020-12-23 DIAGNOSIS — A4189 Other specified sepsis: Principal | ICD-10-CM | POA: Diagnosis present

## 2020-12-23 DIAGNOSIS — Z8249 Family history of ischemic heart disease and other diseases of the circulatory system: Secondary | ICD-10-CM

## 2020-12-23 DIAGNOSIS — T426X6A Underdosing of other antiepileptic and sedative-hypnotic drugs, initial encounter: Secondary | ICD-10-CM | POA: Diagnosis present

## 2020-12-23 DIAGNOSIS — Z4659 Encounter for fitting and adjustment of other gastrointestinal appliance and device: Secondary | ICD-10-CM

## 2020-12-23 DIAGNOSIS — Z9981 Dependence on supplemental oxygen: Secondary | ICD-10-CM

## 2020-12-23 DIAGNOSIS — Z79899 Other long term (current) drug therapy: Secondary | ICD-10-CM

## 2020-12-23 DIAGNOSIS — N179 Acute kidney failure, unspecified: Secondary | ICD-10-CM

## 2020-12-23 DIAGNOSIS — K7031 Alcoholic cirrhosis of liver with ascites: Secondary | ICD-10-CM | POA: Diagnosis not present

## 2020-12-23 DIAGNOSIS — Z20822 Contact with and (suspected) exposure to covid-19: Secondary | ICD-10-CM | POA: Diagnosis present

## 2020-12-23 DIAGNOSIS — T451X6A Underdosing of antineoplastic and immunosuppressive drugs, initial encounter: Secondary | ICD-10-CM | POA: Diagnosis present

## 2020-12-23 DIAGNOSIS — I255 Ischemic cardiomyopathy: Secondary | ICD-10-CM | POA: Diagnosis present

## 2020-12-23 DIAGNOSIS — Z87891 Personal history of nicotine dependence: Secondary | ICD-10-CM

## 2020-12-23 DIAGNOSIS — J9601 Acute respiratory failure with hypoxia: Secondary | ICD-10-CM | POA: Diagnosis not present

## 2020-12-23 DIAGNOSIS — E1122 Type 2 diabetes mellitus with diabetic chronic kidney disease: Secondary | ICD-10-CM | POA: Diagnosis present

## 2020-12-23 DIAGNOSIS — I10 Essential (primary) hypertension: Secondary | ICD-10-CM | POA: Diagnosis not present

## 2020-12-23 DIAGNOSIS — E1165 Type 2 diabetes mellitus with hyperglycemia: Secondary | ICD-10-CM | POA: Diagnosis present

## 2020-12-23 DIAGNOSIS — Z833 Family history of diabetes mellitus: Secondary | ICD-10-CM

## 2020-12-23 DIAGNOSIS — A419 Sepsis, unspecified organism: Secondary | ICD-10-CM

## 2020-12-23 DIAGNOSIS — D75839 Thrombocytosis, unspecified: Secondary | ICD-10-CM | POA: Diagnosis present

## 2020-12-23 DIAGNOSIS — B372 Candidiasis of skin and nail: Secondary | ICD-10-CM | POA: Diagnosis present

## 2020-12-23 DIAGNOSIS — K7460 Unspecified cirrhosis of liver: Secondary | ICD-10-CM | POA: Diagnosis present

## 2020-12-23 DIAGNOSIS — R601 Generalized edema: Secondary | ICD-10-CM | POA: Diagnosis present

## 2020-12-23 DIAGNOSIS — J9622 Acute and chronic respiratory failure with hypercapnia: Secondary | ICD-10-CM | POA: Diagnosis present

## 2020-12-23 DIAGNOSIS — I5033 Acute on chronic diastolic (congestive) heart failure: Secondary | ICD-10-CM | POA: Diagnosis present

## 2020-12-23 DIAGNOSIS — Z803 Family history of malignant neoplasm of breast: Secondary | ICD-10-CM

## 2020-12-23 DIAGNOSIS — Z96653 Presence of artificial knee joint, bilateral: Secondary | ICD-10-CM | POA: Diagnosis present

## 2020-12-23 DIAGNOSIS — R140 Abdominal distension (gaseous): Secondary | ICD-10-CM

## 2020-12-23 DIAGNOSIS — Z90710 Acquired absence of both cervix and uterus: Secondary | ICD-10-CM

## 2020-12-23 DIAGNOSIS — G4733 Obstructive sleep apnea (adult) (pediatric): Secondary | ICD-10-CM | POA: Diagnosis present

## 2020-12-23 DIAGNOSIS — K766 Portal hypertension: Secondary | ICD-10-CM | POA: Diagnosis present

## 2020-12-23 DIAGNOSIS — J439 Emphysema, unspecified: Secondary | ICD-10-CM | POA: Diagnosis present

## 2020-12-23 DIAGNOSIS — T383X6A Underdosing of insulin and oral hypoglycemic [antidiabetic] drugs, initial encounter: Secondary | ICD-10-CM | POA: Diagnosis present

## 2020-12-23 DIAGNOSIS — E876 Hypokalemia: Secondary | ICD-10-CM | POA: Diagnosis present

## 2020-12-23 DIAGNOSIS — A0472 Enterocolitis due to Clostridium difficile, not specified as recurrent: Secondary | ICD-10-CM | POA: Diagnosis not present

## 2020-12-23 DIAGNOSIS — R188 Other ascites: Secondary | ICD-10-CM | POA: Diagnosis present

## 2020-12-23 DIAGNOSIS — Z7951 Long term (current) use of inhaled steroids: Secondary | ICD-10-CM

## 2020-12-23 DIAGNOSIS — R6520 Severe sepsis without septic shock: Secondary | ICD-10-CM | POA: Diagnosis present

## 2020-12-23 DIAGNOSIS — R34 Anuria and oliguria: Secondary | ICD-10-CM | POA: Diagnosis not present

## 2020-12-23 DIAGNOSIS — G40909 Epilepsy, unspecified, not intractable, without status epilepticus: Secondary | ICD-10-CM | POA: Diagnosis present

## 2020-12-23 DIAGNOSIS — K529 Noninfective gastroenteritis and colitis, unspecified: Secondary | ICD-10-CM | POA: Diagnosis present

## 2020-12-23 DIAGNOSIS — J9602 Acute respiratory failure with hypercapnia: Secondary | ICD-10-CM | POA: Diagnosis not present

## 2020-12-23 DIAGNOSIS — Z6836 Body mass index (BMI) 36.0-36.9, adult: Secondary | ICD-10-CM

## 2020-12-23 DIAGNOSIS — J449 Chronic obstructive pulmonary disease, unspecified: Secondary | ICD-10-CM | POA: Diagnosis present

## 2020-12-23 DIAGNOSIS — Z66 Do not resuscitate: Secondary | ICD-10-CM | POA: Diagnosis not present

## 2020-12-23 DIAGNOSIS — T466X6A Underdosing of antihyperlipidemic and antiarteriosclerotic drugs, initial encounter: Secondary | ICD-10-CM | POA: Diagnosis present

## 2020-12-23 DIAGNOSIS — Z8719 Personal history of other diseases of the digestive system: Secondary | ICD-10-CM

## 2020-12-23 DIAGNOSIS — G9341 Metabolic encephalopathy: Secondary | ICD-10-CM | POA: Diagnosis present

## 2020-12-23 DIAGNOSIS — K7200 Acute and subacute hepatic failure without coma: Secondary | ICD-10-CM | POA: Diagnosis present

## 2020-12-23 DIAGNOSIS — N1831 Chronic kidney disease, stage 3a: Secondary | ICD-10-CM | POA: Diagnosis present

## 2020-12-23 DIAGNOSIS — J9621 Acute and chronic respiratory failure with hypoxia: Secondary | ICD-10-CM | POA: Diagnosis present

## 2020-12-23 DIAGNOSIS — Z91138 Patient's unintentional underdosing of medication regimen for other reason: Secondary | ICD-10-CM

## 2020-12-23 DIAGNOSIS — Z87442 Personal history of urinary calculi: Secondary | ICD-10-CM

## 2020-12-23 DIAGNOSIS — D45 Polycythemia vera: Secondary | ICD-10-CM | POA: Diagnosis present

## 2020-12-23 DIAGNOSIS — G9340 Encephalopathy, unspecified: Secondary | ICD-10-CM | POA: Diagnosis not present

## 2020-12-23 DIAGNOSIS — F32A Depression, unspecified: Secondary | ICD-10-CM | POA: Diagnosis present

## 2020-12-23 DIAGNOSIS — E1129 Type 2 diabetes mellitus with other diabetic kidney complication: Secondary | ICD-10-CM | POA: Diagnosis present

## 2020-12-23 DIAGNOSIS — E875 Hyperkalemia: Secondary | ICD-10-CM | POA: Diagnosis present

## 2020-12-23 DIAGNOSIS — T503X6A Underdosing of electrolytic, caloric and water-balance agents, initial encounter: Secondary | ICD-10-CM | POA: Diagnosis present

## 2020-12-23 DIAGNOSIS — R0902 Hypoxemia: Secondary | ICD-10-CM

## 2020-12-23 DIAGNOSIS — J9600 Acute respiratory failure, unspecified whether with hypoxia or hypercapnia: Secondary | ICD-10-CM

## 2020-12-23 DIAGNOSIS — T501X6A Underdosing of loop [high-ceiling] diuretics, initial encounter: Secondary | ICD-10-CM | POA: Diagnosis present

## 2020-12-23 DIAGNOSIS — Z7984 Long term (current) use of oral hypoglycemic drugs: Secondary | ICD-10-CM

## 2020-12-23 DIAGNOSIS — Z978 Presence of other specified devices: Secondary | ICD-10-CM

## 2020-12-23 DIAGNOSIS — J441 Chronic obstructive pulmonary disease with (acute) exacerbation: Secondary | ICD-10-CM

## 2020-12-23 DIAGNOSIS — T465X6A Underdosing of other antihypertensive drugs, initial encounter: Secondary | ICD-10-CM | POA: Diagnosis present

## 2020-12-23 DIAGNOSIS — Z781 Physical restraint status: Secondary | ICD-10-CM

## 2020-12-23 LAB — COMPREHENSIVE METABOLIC PANEL
ALT: 15 U/L (ref 0–44)
AST: 33 U/L (ref 15–41)
Albumin: 3.8 g/dL (ref 3.5–5.0)
Alkaline Phosphatase: 88 U/L (ref 38–126)
Anion gap: 14 (ref 5–15)
BUN: 42 mg/dL — ABNORMAL HIGH (ref 8–23)
CO2: 25 mmol/L (ref 22–32)
Calcium: 10.6 mg/dL — ABNORMAL HIGH (ref 8.9–10.3)
Chloride: 101 mmol/L (ref 98–111)
Creatinine, Ser: 2.12 mg/dL — ABNORMAL HIGH (ref 0.44–1.00)
GFR, Estimated: 24 mL/min — ABNORMAL LOW (ref >60)
Glucose, Bld: 123 mg/dL — ABNORMAL HIGH (ref 70–99)
Potassium: 5.5 mmol/L — ABNORMAL HIGH (ref 3.5–5.1)
Sodium: 140 mmol/L (ref 135–145)
Total Bilirubin: 1.1 mg/dL (ref 0.3–1.2)
Total Protein: 6.8 g/dL (ref 6.5–8.1)

## 2020-12-23 LAB — CBC WITH DIFFERENTIAL/PLATELET
Abs Immature Granulocytes: 0.5 10*3/uL — ABNORMAL HIGH (ref 0.00–0.07)
Basophils Absolute: 0.2 10*3/uL — ABNORMAL HIGH (ref 0.0–0.1)
Basophils Relative: 1 %
Eosinophils Absolute: 0.1 10*3/uL (ref 0.0–0.5)
Eosinophils Relative: 0 %
HCT: 39.4 % (ref 36.0–46.0)
Hemoglobin: 11.6 g/dL — ABNORMAL LOW (ref 12.0–15.0)
Immature Granulocytes: 2 %
Lymphocytes Relative: 1 %
Lymphs Abs: 0.2 10*3/uL — ABNORMAL LOW (ref 0.7–4.0)
MCH: 27.4 pg (ref 26.0–34.0)
MCHC: 29.4 g/dL — ABNORMAL LOW (ref 30.0–36.0)
MCV: 92.9 fL (ref 80.0–100.0)
Monocytes Absolute: 1.4 10*3/uL — ABNORMAL HIGH (ref 0.1–1.0)
Monocytes Relative: 6 %
Neutro Abs: 20.7 10*3/uL — ABNORMAL HIGH (ref 1.7–7.7)
Neutrophils Relative %: 90 %
Platelets: 605 10*3/uL — ABNORMAL HIGH (ref 150–400)
RBC: 4.24 MIL/uL (ref 3.87–5.11)
RDW: 16.7 % — ABNORMAL HIGH (ref 11.5–15.5)
WBC: 23.1 10*3/uL — ABNORMAL HIGH (ref 4.0–10.5)
nRBC: 0 % (ref 0.0–0.2)

## 2020-12-23 LAB — URINALYSIS, COMPLETE (UACMP) WITH MICROSCOPIC
Bilirubin Urine: NEGATIVE
Glucose, UA: NEGATIVE mg/dL
Hgb urine dipstick: NEGATIVE
Ketones, ur: 5 mg/dL — AB
Nitrite: NEGATIVE
Protein, ur: 100 mg/dL — AB
Specific Gravity, Urine: 1.016 (ref 1.005–1.030)
pH: 5 (ref 5.0–8.0)

## 2020-12-23 LAB — BRAIN NATRIURETIC PEPTIDE: B Natriuretic Peptide: 853.7 pg/mL — ABNORMAL HIGH (ref 0.0–100.0)

## 2020-12-23 LAB — MAGNESIUM: Magnesium: 2.2 mg/dL (ref 1.7–2.4)

## 2020-12-23 LAB — TROPONIN I (HIGH SENSITIVITY)
Troponin I (High Sensitivity): 81 ng/L — ABNORMAL HIGH (ref <18)
Troponin I (High Sensitivity): 80 ng/L — ABNORMAL HIGH (ref <18)

## 2020-12-23 LAB — LACTIC ACID, PLASMA: Lactic Acid, Venous: 1.8 mmol/L (ref 0.5–1.9)

## 2020-12-23 LAB — POC SARS CORONAVIRUS 2 AG -  ED: SARS Coronavirus 2 Ag: NEGATIVE

## 2020-12-23 LAB — TSH: TSH: 2.914 u[IU]/mL (ref 0.350–4.500)

## 2020-12-23 MED ORDER — METOPROLOL TARTRATE 5 MG/5ML IV SOLN: 5.0000 mg | INTRAVENOUS | Status: DC | PRN

## 2020-12-23 MED ORDER — METHYLPREDNISOLONE SODIUM SUCC 125 MG IJ SOLR
125.0000 mg | Freq: Once | INTRAMUSCULAR | Status: AC
  Administered 2020-12-23: 125 mg via INTRAVENOUS
  Filled 2020-12-23: qty 2

## 2020-12-23 MED ORDER — MULTI-VITAMINS PO TABS: 1.0000 | ORAL_TABLET | Freq: Every day | ORAL | Status: DC

## 2020-12-23 MED ORDER — ADULT MULTIVITAMIN W/MINERALS CH
1.0000 | ORAL_TABLET | Freq: Every day | ORAL | Status: DC
  Filled 2020-12-23 (×2): qty 1

## 2020-12-23 MED ORDER — ATORVASTATIN CALCIUM 20 MG PO TABS
40.0000 mg | ORAL_TABLET | Freq: Every day | ORAL | Status: DC
  Filled 2020-12-23: qty 2

## 2020-12-23 MED ORDER — DILTIAZEM HCL 25 MG/5ML IV SOLN
20.0000 mg | Freq: Once | INTRAVENOUS | Status: DC
  Filled 2020-12-23: qty 5

## 2020-12-23 MED ORDER — FUROSEMIDE 10 MG/ML IJ SOLN
80.0000 mg | Freq: Every day | INTRAMUSCULAR | Status: DC
  Administered 2020-12-24: 80 mg via INTRAVENOUS
  Filled 2020-12-23: qty 8

## 2020-12-23 MED ORDER — ALBUTEROL SULFATE (2.5 MG/3ML) 0.083% IN NEBU
1.2500 mg | INHALATION_SOLUTION | Freq: Four times a day (QID) | RESPIRATORY_TRACT | Status: DC | PRN
  Filled 2020-12-23: qty 3

## 2020-12-23 MED ORDER — ALBUTEROL SULFATE HFA 108 (90 BASE) MCG/ACT IN AERS
2.0000 | INHALATION_SPRAY | Freq: Four times a day (QID) | RESPIRATORY_TRACT | Status: DC | PRN
  Filled 2020-12-23: qty 6.7

## 2020-12-23 MED ORDER — ALBUMIN HUMAN 25 % IV SOLN
12.5000 g | Freq: Once | INTRAVENOUS | Status: DC | PRN
  Filled 2020-12-23: qty 50

## 2020-12-23 MED ORDER — LEVETIRACETAM IN NACL 500 MG/100ML IV SOLN
500.0000 mg | Freq: Two times a day (BID) | INTRAVENOUS | Status: DC
  Administered 2020-12-24 – 2020-12-29 (×11): 500 mg via INTRAVENOUS
  Filled 2020-12-23 (×17): qty 100

## 2020-12-23 MED ORDER — LINAGLIPTIN 5 MG PO TABS
5.0000 mg | ORAL_TABLET | Freq: Every day | ORAL | Status: DC
  Filled 2020-12-23 (×2): qty 1

## 2020-12-23 MED ORDER — ACETAMINOPHEN 650 MG RE SUPP: 325.0000 mg | Freq: Four times a day (QID) | RECTAL | Status: DC | PRN

## 2020-12-23 MED ORDER — ONDANSETRON HCL 4 MG PO TABS
4.0000 mg | ORAL_TABLET | Freq: Four times a day (QID) | ORAL | Status: DC | PRN
Start: 2020-12-23 — End: 2020-12-24

## 2020-12-23 MED ORDER — FUROSEMIDE 10 MG/ML IJ SOLN
60.0000 mg | Freq: Once | INTRAMUSCULAR | Status: AC
  Administered 2020-12-23: 60 mg via INTRAVENOUS
  Filled 2020-12-23: qty 8

## 2020-12-23 MED ORDER — HEPARIN SODIUM (PORCINE) 5000 UNIT/ML IJ SOLN
5000.0000 [IU] | Freq: Three times a day (TID) | INTRAMUSCULAR | Status: DC
  Administered 2020-12-23 – 2020-12-29 (×16): 5000 [IU] via SUBCUTANEOUS
  Filled 2020-12-23 (×15): qty 1

## 2020-12-23 MED ORDER — MONTELUKAST SODIUM 10 MG PO TABS
10.0000 mg | ORAL_TABLET | Freq: Every day | ORAL | Status: DC
  Administered 2020-12-24: 10 mg via ORAL
  Filled 2020-12-23 (×2): qty 1

## 2020-12-23 MED ORDER — ONDANSETRON HCL 4 MG/2ML IJ SOLN
4.0000 mg | Freq: Four times a day (QID) | INTRAMUSCULAR | Status: DC | PRN
Start: 2020-12-23 — End: 2020-12-24

## 2020-12-23 MED ORDER — HYDROXYUREA 500 MG PO CAPS: 500.0000 mg | ORAL_CAPSULE | ORAL | Status: DC

## 2020-12-23 MED ORDER — FLUTICASONE PROPIONATE 50 MCG/ACT NA SUSP
2.0000 | Freq: Every day | NASAL | Status: DC
  Filled 2020-12-23: qty 16

## 2020-12-23 MED ORDER — SODIUM CHLORIDE 0.9 % IV SOLN
2.0000 g | INTRAVENOUS | Status: DC
  Administered 2020-12-23 – 2020-12-24 (×2): 2 g via INTRAVENOUS
  Filled 2020-12-23: qty 2
  Filled 2020-12-23 (×3): qty 20

## 2020-12-23 MED ORDER — LEVETIRACETAM 500 MG PO TABS: 500.0000 mg | ORAL_TABLET | Freq: Two times a day (BID) | ORAL | Status: DC

## 2020-12-23 MED ORDER — SODIUM CHLORIDE 0.9 % IV SOLN
500.0000 mg | INTRAVENOUS | Status: DC
  Administered 2020-12-23 – 2020-12-24 (×2): 500 mg via INTRAVENOUS
  Filled 2020-12-23 (×4): qty 500

## 2020-12-23 MED ORDER — DILTIAZEM HCL 60 MG PO TABS
60.0000 mg | ORAL_TABLET | Freq: Once | ORAL | Status: AC
  Administered 2020-12-23: 60 mg via ORAL
  Filled 2020-12-23: qty 1

## 2020-12-23 MED ORDER — UMECLIDINIUM-VILANTEROL 62.5-25 MCG/INH IN AEPB: 1.0000 | INHALATION_SPRAY | Freq: Every day | RESPIRATORY_TRACT | Status: DC

## 2020-12-23 MED ORDER — IPRATROPIUM-ALBUTEROL 0.5-2.5 (3) MG/3ML IN SOLN
6.0000 mL | Freq: Once | RESPIRATORY_TRACT | Status: AC
  Administered 2020-12-23: 6 mL via RESPIRATORY_TRACT
  Filled 2020-12-23: qty 6

## 2020-12-23 MED ORDER — ACETAMINOPHEN 325 MG PO TABS: 325.0000 mg | ORAL_TABLET | Freq: Four times a day (QID) | ORAL | Status: DC | PRN

## 2020-12-23 NOTE — ED Triage Notes (Signed)
Pt to ED via EMS from home. Per EMS, Pt husband states that pt has been declining over the past 3 days. Pt has not been eating, drinking, or taking her daily meds. EMS states that pt legs was warm and hot on arrival. Pt also was alert, but not oriented. Pt 02 sat was in the low 80s. EMS placed pt on 2L nasal cannula, but had to place pt on non rebreather at 10L. Pt has hx of Afib, COPD, and is on blood thinner. EMS vitals were: BP 114/62. HR 110-140s. CBG 135. Temp 98. Pt is alert at this time. Pt denies pain at this time as well.

## 2020-12-23 NOTE — Consult Note (Signed)
CODE SEPSIS - PHARMACY COMMUNICATION  **Broad Spectrum Antibiotics should be administered within 1 hour of Sepsis diagnosis**  Time Code Sepsis Called/Page Received: 1646  Antibiotics Ordered:  Azithromycin 500mg  IV q24h Ceftriaxone 2g IV q24h  Time of 1st antibiotic administration: [place], PharmD Pharmacy Resident  12/28/2020 4:51 PM

## 2020-12-23 NOTE — ED Notes (Signed)
This RN bladder scanned pt and pt  bladder had 366mls of urine. This RN notified Cox, MD and Cox, MD told this RN to an in and out.

## 2020-12-23 NOTE — Sepsis Progress Note (Signed)
eLink is monitoring this Code Sepsis. °

## 2020-12-23 NOTE — ED Provider Notes (Signed)
[facility] Emergency Department Provider Note ____________________________________________   Event Date/Time   First MD Initiated Contact with Patient 12/19/2020 1536     (approximate)  I have reviewed the triage vital signs and the nursing notes.  HISTORY  Chief Complaint Altered Mental Status   HPI Angelica Juarez is a 80 y.o. femalewho presents to the ED for evaluation of altered mentation and weakness.   Chart review indicates hx of A. fib no longer on Eliquis Eliquis, COPD on 2 L home O2, diastolic CHF on Lasix, polycythemia and stage III CKD.  Patient presents to the ED with 3-4 days of generalized weakness, altered mentation and inability to get up.  She lives at home and reportedly has been confined to a recliner chair for the past 3 days, soiling herself and unable to get up.  EMS report that she has not been taking her medications in this timeframe and her husband called 16 today because she slid down out of her recliner onto the floor and was unable to get up.  She was hypoxic with EMS to about 80%, requiring 10 L NRB.  History limited due to patient's delirium and disorientation, unable to provide relevant history.   Past Medical History:  Diagnosis Date  . Allergy    Seasonal  . Arthritis   . 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 Chronic kidney disease    chronic renal insufficiency  . Colon polyps 05/21/14  . COPD (chronic obstructive pulmonary disease) ([place])   . Diabetes mellitus without complication ([place])   . Dyspnea   . Fracture closed, humerus, shaft    right   . History of kidney stones    40 years ago  . Hyperlipidemia 04/30/14  . Hypertension   . Impingement syndrome of right shoulder 12/29/15  . Personal history of radiation therapy   . Pneumonia    hx  . Rotator cuff tear 04/21/16   right  . Seizures ([place])   . Sleep apnea     Patient Active Problem List   Diagnosis Date Noted  . Trauma   . Cirrhosis of liver  with ascites ([place])   . Symptomatic anemia 10/15/2020  . Macrocytic anemia 10/14/2020  . Acute blood loss anemia 10/14/2020  . Chronic respiratory failure ([place]) 10/14/2020  . Chronic kidney disease   . Hyperkalemia   . Hematoma of right chest wall   . Acute on chronic diastolic CHF (congestive heart failure) ([place]) 09/29/2020  . Cellulitis of left leg 09/28/2020  . Rash and nonspecific skin eruption 09/28/2020  . Left leg cellulitis 09/28/2020  . Normocytic anemia 06/11/2020  . COPD with acute exacerbation ([place]) 06/11/2020  . Chronic diarrhea of unknown origin   . Acute left flank pain 01/15/2020  . Hypokalemia 11/14/2019  . Proteinuria 08/09/2019  . Acute on chronic congestive heart failure ([place]) 07/09/2019  . Nonrheumatic mitral valve regurgitation 07/08/2019  . Other persistent atrial fibrillation ([place]) 07/08/2019  . C. difficile diarrhea 11/24/2018  . Diarrhea 11/18/2018  . Nausea without vomiting 11/18/2018  . Goals of care, counseling/discussion 09/12/2018  . 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 History of repair of right rotator cuff 08/25/2016  . Acute respiratory failure with hypoxia ([place]) 08/06/2016  . Nontraumatic tear of right rotator cuff 04/21/2016  . Atypical chest pain 04/07/2016  . Impingement syndrome of right shoulder 12/29/2015  . Primary osteoarthritis of first carpometacarpal joint of left hand 10/27/2015  . Right shoulder pain 10/27/2015  .  OSA on CPAP 10/14/2015  . COPD (chronic obstructive pulmonary disease) ([place]) 07/13/2015  . Type 2 diabetes mellitus with microalbuminuria, without long-term current use of insulin ([place]) 07/13/2015  . Hypertension 03/05/2015  . Difficulty sleeping 11/26/2014  . Chronic kidney disease, stage 3a 07/31/2014  . Seizure disorder ([place]) 07/31/2014  . History of colonic polyps 05/21/2014  . Hyperlipidemia, unspecified 04/30/2014    Past Surgical History:  Procedure Laterality Date  . ABDOMINAL HYSTERECTOMY    .  COLONOSCOPY WITH PROPOFOL N/A 06/01/2020   Procedure: COLONOSCOPY WITH PROPOFOL;  Surgeon: Lin Landsman, MD;  Location: [facility] ENDOSCOPY;  Service: Gastroenterology;  Laterality: N/A;  . ESOPHAGOGASTRODUODENOSCOPY (EGD) WITH PROPOFOL N/A 06/01/2020   Procedure: ESOPHAGOGASTRODUODENOSCOPY (EGD) WITH PROPOFOL;  Surgeon: Lin Landsman, MD;  Location: [facility] ENDOSCOPY;  Service: Gastroenterology;  Laterality: N/A;  . 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 EYE SURGERY     bilateral cataracts  . JOINT REPLACEMENT     bilateral knee replacement  . NASAL SEPTUM SURGERY    . OOPHORECTOMY    . REPLACEMENT TOTAL KNEE Bilateral 2004  . SHOULDER ARTHROSCOPY WITH BICEPSTENOTOMY Right 05/25/2016   Procedure: SHOULDER ARTHROSCOPY WITH BICEPSTENOTOMY;  Surgeon: Leanor Kail, MD;  Location: [facility] ORS;  Service: Orthopedics;  Laterality: Right;  . SHOULDER ARTHROSCOPY WITH DISTAL CLAVICLE RESECTION Right 05/25/2016   Procedure: SHOULDER ARTHROSCOPY WITH DISTAL CLAVICLE RESECTION;  Surgeon: Leanor Kail, MD;  Location: [facility] ORS;  Service: Orthopedics;  Laterality: Right;  . SHOULDER ARTHROSCOPY WITH OPEN ROTATOR CUFF REPAIR Right 05/25/2016   Procedure: SHOULDER ARTHROSCOPY WITH OPEN ROTATOR CUFF REPAIR;  Surgeon: Leanor Kail, MD;  Location: [facility] ORS;  Service: Orthopedics;  Laterality: Right;  . SUBACROMIAL DECOMPRESSION Right 05/25/2016   Procedure: SUBACROMIAL DECOMPRESSION;  Surgeon: Leanor Kail, MD;  Location: [facility] ORS;  Service: Orthopedics;  Laterality: Right;    Prior to Admission medications   Medication Sig Start Date End Date Taking? Authorizing Provider  albuterol (ACCUNEB) 1.25 MG/3ML nebulizer solution Take 3 mLs (1.25 mg total) by nebulization every 6 (six) hours as needed. wheezing 05/03/19   Allyne Gee, MD  atorvastatin (LIPITOR) 40 MG tablet Take 40 mg by mouth daily. 04/04/16   [provider]  diphenhydrAMINE (BENADRYL) 25 mg capsule Take 25 mg by  mouth daily.    [provider]  fluticasone (FLONASE) 50 MCG/ACT nasal spray Place 2 sprays into both nostrils daily. 08/08/19   Kendell Bane, NP  furosemide (LASIX) 40 MG tablet Take 1 tablet (40 mg total) by mouth daily. 09/14/20   Lin Landsman, MD  hydroxyurea (HYDREA) 500 MG capsule TAKE ONE CAPSULE BY MOUTH TWICE DAILY ON MONDAYS ONLY, 1 CAPSULE DAILY ON TUESDAY TO SUNDAY. MAY TAKE WITH FOOD TO MINIMIZE GI SIDE EFFECTS 10/15/20   Lequita Asal, MD  levETIRAcetam (KEPPRA) 500 MG tablet Take 500 mg by mouth 2 (two) times daily. 06/05/20   [provider]  metFORMIN (GLUCOPHAGE) 500 MG tablet Take 500 mg by mouth 2 (two) times daily with a meal. 12/25/15   [provider]  montelukast (SINGULAIR) 10 MG tablet TAKE 1 TABLET BY MOUTH EVERY DAY Patient taking differently: Take 10 mg by mouth at bedtime.  07/02/15   Kathrine Haddock, NP  Multiple Vitamin (MULTI-VITAMINS) TABS Take 1 tablet by mouth daily.    [provider]  OXYGEN Inhale 2 L into the lungs.    [provider]  potassium chloride SA (KLOR-CON) 20 MEQ tablet Take  20 mEq by mouth daily. 08/23/20   [provider]  sitaGLIPtin (JANUVIA) 25 MG tablet Take 1 tablet by mouth daily. 01/01/18   [provider]  traZODone (DESYREL) 100 MG tablet Take by mouth. 07/22/20   [provider]  umeclidinium-vilanterol (ANORO ELLIPTA) 62.5-25 MCG/INH AEPB Inhale 1 puff into the lungs daily. 02/06/20   Kendell Bane, NP  valsartan-hydrochlorothiazide (DIOVAN-HCT) 320-25 MG tablet Take 1 tablet by mouth daily. 04/14/16   [provider]  VENTOLIN HFA 108 (90 Base) MCG/ACT inhaler INHALE 2 PUFFS INTO THE LUNGS EVERY 6 HOURS AS NEEDED Patient taking differently: Inhale 2 puffs into the lungs every 6 (six) hours as needed for wheezing or shortness of breath.  02/18/19   Kendell Bane, NP    Allergies Iodinated diagnostic agents, Lamictal [lamotrigine], Latex,  Phenobarbital, and Tape  Family History  Problem Relation Age of Onset  . Breast cancer Paternal Aunt 48  . Diabetes Father   . Heart disease Father   . Breast cancer Daughter 33    Social History Social History   Tobacco Use  . Smoking status: Former Smoker    Packs/day: 1.00    Years: 40.00    Pack years: 40.00    Quit date: 05/12/2005    Years since quitting: 15.6  . Smokeless tobacco: Never Used  Vaping Use  . Vaping Use: Never used  Substance Use Topics  . Alcohol use: No  . Drug use: No    Review of Systems  Unable to be reliably assessed due to patient's altered mentation. ____________________________________________   PHYSICAL EXAM:  VITAL SIGNS: Vitals:   12/15/2020 1555 01/03/2021 1722  BP: (!) 123/98 (!) 161/67  Pulse: (!) 105   Resp: (!) 28   Temp: (!) 97.2 F (36.2 C)   SpO2: 93%      Constitutional: Alert and oriented to self only.  Obvious anasarca from the legs up through the abdomen.  Tachypneic to the low 30s.  Follows simple commands in all 4 extremities without obvious deficit. Eyes: Conjunctivae are normal. PERRL. EOMI. Head: Atraumatic. Nose: No congestion/rhinnorhea. Mouth/Throat: Mucous membranes are dry.  Oropharynx non-erythematous. Neck: No stridor. No cervical spine tenderness to palpation. Cardiovascular: Tachycardic and irregular. Grossly normal heart sounds.  Good peripheral circulation. Respiratory: Tachypneic on NRB.  Diffuse expiratory wheezes with decreased air movement.  Bibasilar crackles. Gastrointestinal: Soft , nondistended. No CVA tenderness.  Pitting edema to the abdomen. Musculoskeletal: Chronic venous stasis dermatitis without discrete trauma or laceration.  Pitting edema bilaterally throughout bilateral legs. Neurologic:   No gross focal neurologic deficits are appreciated.  Follows simple commands in all 4 extremities. Skin:  Skin is warm, dry Psychiatric: Mood and affect are  appropriate  ____________________________________________   LABS (all labs ordered are listed, but only abnormal results are displayed)  Labs Reviewed  COMPREHENSIVE METABOLIC PANEL - Abnormal; Notable for the following components:      Result Value   Potassium 5.5 (*)    Glucose, Bld 123 (*)    BUN 42 (*)    Creatinine, Ser 2.12 (*)    Calcium 10.6 (*)    GFR, Estimated 24 (*)    All other components within normal limits  CBC WITH DIFFERENTIAL/PLATELET - Abnormal; Notable for the following components:   WBC 23.1 (*)    Hemoglobin 11.6 (*)    MCHC 29.4 (*)    RDW 16.7 (*)    Platelets 605 (*)    Neutro Abs 20.7 (*)  Lymphs Abs 0.2 (*)    Monocytes Absolute 1.4 (*)    Basophils Absolute 0.2 (*)    Abs Immature Granulocytes 0.50 (*)    All other components within normal limits  BRAIN NATRIURETIC PEPTIDE - Abnormal; Notable for the following components:   B Natriuretic Peptide 853.7 (*)    All other components within normal limits  TROPONIN I (HIGH SENSITIVITY) - Abnormal; Notable for the following components:   Troponin I (High Sensitivity) 81 (*)    All other components within normal limits  SARS CORONAVIRUS 2 (TAT 6-24 HRS)  CULTURE, BLOOD (SINGLE)  MAGNESIUM  LACTIC ACID, PLASMA  URINALYSIS, COMPLETE (UACMP) WITH MICROSCOPIC  POC SARS CORONAVIRUS 2 AG -  ED  TROPONIN I (HIGH SENSITIVITY)   ____________________________________________  12 Lead EKG  A. fib, rate of 116 bpm.  Rightward axis.  Appropriate intervals.  Inferior T wave inversions without STEMI criteria. ____________________________________________  RADIOLOGY  ED MD interpretation: CT head reviewed by me without evidence of acute intracranial pathology. Plain film of the chest reviewed by me with cardiomegaly, small bilateral pleural effusions and pulmonary vascular congestion.  Official radiology report(s): CT Head Wo Contrast  Result Date: 12/27/2020 CLINICAL DATA:  Mental status change, unknown  cause; altered, on Eliquis, evaluate ICH. EXAM: CT HEAD WITHOUT CONTRAST TECHNIQUE: Contiguous axial images were obtained from the base of the skull through the vertex without intravenous contrast. COMPARISON:  No pertinent prior exams available for comparison. FINDINGS: Brain: Mild cerebral and cerebellar atrophy. There is no acute intracranial hemorrhage. No demarcated cortical infarct. No extra-axial fluid collection. No evidence of intracranial mass. No midline shift. Vascular: No hyperdense vessel.  Atherosclerotic calcifications. Skull: Normal. Negative for fracture or focal lesion. Sinuses/Orbits: Visualized orbits show no acute finding. Small calcification along the posterior aspect of the left globe. No significant paranasal sinus disease at the imaged levels. IMPRESSION: No evidence of acute intracranial abnormality. Mild generalized atrophy of the brain. 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 Electronically Signed   By: Kellie Simmering DO   On: 01/06/2021 16:43   DG Chest Portable 1 View  Result Date: 12/19/2020 CLINICAL DATA:  COPD/CHF exacerbation EXAM: PORTABLE CHEST 1 VIEW COMPARISON:  09/28/2020 FINDINGS: Background emphysema. Interstitial prominence with patchy increased density bilaterally. Small bilateral pleural effusions with bibasilar atelectasis/consolidation. Stable cardiomegaly. No pneumothorax. IMPRESSION: Emphysema likely with superimposed edema or atypical pneumonia. Small bilateral pleural effusions. Electronically Signed   By: Macy Mis M.D.   On: 01/07/2021 16:18    ____________________________________________   PROCEDURES and INTERVENTIONS  Procedure(s) performed (including Critical Care):  .1-3 Lead EKG Interpretation Performed by: Vladimir Crofts, MD Authorized by: Vladimir Crofts, MD     Interpretation: abnormal     ECG rate:  110   ECG rate assessment: tachycardic     Rhythm: atrial fibrillation     Ectopy: none      Conduction: normal   .Critical Care Performed by: Vladimir Crofts, MD Authorized by: Vladimir Crofts, MD   Critical care provider statement:    Critical care time (minutes):  35   Critical care was necessary to treat or prevent imminent or life-threatening deterioration of the following conditions:  Sepsis and respiratory failure   Critical care was time spent personally by me on the following activities:  Discussions with consultants, evaluation of patient's response to treatment, examination of patient, ordering and performing treatments and interventions, ordering and review of laboratory studies, ordering and review of radiographic studies,  pulse oximetry, re-evaluation of patient's condition, obtaining history from patient or surrogate and review of old charts    Medications  cefTRIAXone (ROCEPHIN) 2 g in sodium chloride 0.9 % 100 mL IVPB (2 g Intravenous New Bag/Given 01/02/2021 1718)  azithromycin (ZITHROMAX) 500 mg in sodium chloride 0.9 % 250 mL IVPB (has no administration in time range)  ipratropium-albuterol (DUONEB) 0.5-2.5 (3) MG/3ML nebulizer solution 6 mL (6 mLs Nebulization Given 12/16/2020 1614)  methylPREDNISolone sodium succinate (SOLU-MEDROL) 125 mg/2 mL injection 125 mg (125 mg Intravenous Given 12/17/2020 1611)  furosemide (LASIX) injection 60 mg (60 mg Intravenous Given 01/10/2021 1702)  diltiazem (CARDIZEM) tablet 60 mg (60 mg Oral Given 12/24/2020 1722)    ____________________________________________   MDM / ED COURSE   80 year old woman presents with evidence of sepsis, CHF exacerbation and COPD exacerbation requiring medical admission.  Patient tachycardic in A. fib with rates 100-110, but hemodynamically stable.  She is hypoxic necessitating 4 L nasal cannula.  Exam with a nonfocal patient without evidence of neurovascular deficit, she does have signs of COPD exacerbation.  Blood work with significant leukocytosis and leftward shift, concerning for sepsis in the setting of her  dyspnea, tachypnea and hypoxia.  Sepsis protocols were followed and CAP coverage was provided.  Blood work further with significantly elevated BNP, and no lactic acidosis or signs of severe sepsis.  CXR congested with bilateral pleural effusions, suggestive of volume overload and CHF exacerbation.  Provided breathing treatments and initiated diuresis.  Her heart rate remains around 100-1 10, will provide a single dose of oral diltiazem for improved rate control.  We will discussed the case with hospitalist medicine for for admission  Clinical Course as of 12/30/2020 1740  Wed Dec 23, 2020  1648 Leukocytosis and metabolic panel noted. We will cover for CAP via sepsis protocols and initiate diuresis with first dose IV Lasix [DS]  7408 Nurse informs me of elevated blood pressure with systolic of about 144.  Patient just got oral diltiazem.  Heart rate remains 100-110.  We will provide a single bolus of IV diltiazem as the oral medication is being metabolized [DS]    Clinical Course User Index [DS] Vladimir Crofts, MD    ____________________________________________   FINAL CLINICAL IMPRESSION(S) / ED DIAGNOSES  Final diagnoses:  Acute on chronic diastolic congestive heart failure ([place])  COPD with acute exacerbation ([place])  Hypoxia  Metabolic encephalopathy  AKI (acute kidney injury) ([place])  Sepsis with acute hypoxic respiratory failure without septic shock, due to unspecified organism [facility]     ED Discharge Orders    None       Rachid Parham   Note:  This document was prepared using Dragon voice recognition software and may include unintentional dictation errors.   Vladimir Crofts, MD 01/11/2021 [phone]

## 2020-12-23 NOTE — ED Notes (Signed)
Pt to CT now

## 2020-12-23 NOTE — H&P (Addendum)
History and Physical   Angelica Juarez MOQ:[id number] DOB: 1941-02-23 DOA: 01/08/2021  PCP: Sofie Hartigan, MD  Outpatient Specialists: Dr. Mike Gip ([place]) Patient coming from: Home  I have personally briefly reviewed patient's old medical records in [place].  Chief Concern: Altered mentation  HPI: Angelica Juarez is a 80 y.o. female with medical history significant for polycythemia vera, stage III chronic kidney disease, COPD, atrial fibrillation currently not on anticoagulation due to history of retroperitoneal bleed, heart failure with preserved ejection fraction, hypertension, presented to the emergency department for chief concerns of altered mentation.  HPI obtained from ED provider, chart, life partner.  Per Mr. Elpidio Eric, patient has not been herself for the last 2 to 3 weeks.  He reports that at baseline he helps her to ambulate to the restroom.  She is minimally mobile and sits in her chair most days.  He then reports that in the last 2 to 3 days, patient has not wanted to ambulate to use the restroom and has been soiling on herself.  He further endorses that she has not been taking her medications for the last several days.  Mr. Durward Fortes was not clear as to how long patient has not been taking her medication.  He did not give me a reason as to why EMS was not called earlier. Per EMS report, patient was found in her own stool and altered.  At bedside, patient was able to tell me her name, age, location of hospital.  However she did not understand why she was in the hospital, she was unable to tell me or did not remember that she had stooled on herself.  She does not know who she lived with initially.  She was not able to tell me who called EMS.  Vaccinations: she has had two covid vaccines  Social history: she lives with her partner of 30 years, Mr. Darl Householder. Per Mr. Whitfield, she has never used tobacco, etoh, or recreational drugs.    ROS: Unable to obtain due to altered mentation  ED Course: Discussed with ED provider, patient requiring hospitalization due to sepsis-like presentation.  Assessment/Plan  Principal Problem:   Sepsis ([place]) Active Problems:   Acute respiratory failure with hypoxia (HCC)   Hypertension   Hyperlipidemia, unspecified   C. difficile diarrhea   Acute on chronic diastolic CHF (congestive heart failure) (HCC)   Cirrhosis of liver with ascites ([place])   # Sepsis suspected-source unclear at this time however atypical pneumonia is suspected versus SBP # Possible severe sepsis in setting of encephalopathy, and acute kidney injury # Leukocytosis -A. fib with rate of 94-1 23, increased respiration rate, requiring nonrebreather oxygen supplementation -WBC 23.1 -C. difficile ordered, blood cultures x2, paracentesis cultures ordered, UA -Continue azithromycin 500 mg IV every 24 hours and ceftriaxone 2 g IV every 24 hours for Pneumonia coverage  # Acute on chronic hypoxic respiratory failure-likely secondary to atypical pneumonia treat as above - At baseline patient is on continuous 2 L [place] for COPD  - Oxygen supplementation to maintain spO2 > 92% -Covid test is in process  # Acute kidney injury-query cardiorenal in setting of bilateral pulmonary edema and bilateral pleural effusions on chest x-ray versus prerenal in setting of poor p.o. intake -Complete echo ordered -Status post Lasix 60 mg IV per ED provider -Lasix 80 mg IV starting tomorrow -Strict I's and O's -Bladder scan as patient has not urinated since hospitalization -We will place Foley catheter versus  in and out cath pending bladder scan -Holding home arbs -BMP in a.m.  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  # Heart failure exacerbation-suspect systolic -Echo ordered -BNP elevated at 857 -Patient has not been taking her Lasix for unknown periods of time  # Abdominal distention create baseline in setting of liver cirrhosis -IR paracentesis ordered -As needed ammonia once ordered for MAP less than 65  # OSA on CPAP-CPAP nightly ordered  # Atrial fibrillation-history of retroperitoneal bleed and anemia requiring transfusion -Patient on Eliquis -Recommend patient follow-up with cardiology outpatient for resumption of Eliquis if indicated  # History of polycythemia rubra-continue hydroxyurea, outpatient hematology follow-up  # History of seizures-continue Keppra, no seizure in the last couple of days per life-partner  As needed meds: Ondansetron and acetaminophen Aspiration, fall, seizure precautions  Chart reviewed.   DVT prophylaxis: Heparin injection Code Status: Partial Diet: N.p.o. except for sips with meds due to concerns for aspiration Family Communication: Discussed with life partner who is also legal healthcare power of attorney Disposition Plan: Pending clinical course Consults called: IR for paracentesis Admission status: Inpatient with telemetry to progressive cardiac  Past Medical History:  Diagnosis Date  . Allergy    Seasonal  . Arthritis   . 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 Chronic kidney disease    chronic renal insufficiency  . Colon polyps 05/21/14  . COPD (chronic obstructive pulmonary disease) ([place])   . Diabetes mellitus without complication ([place])   . Dyspnea   . Fracture closed, humerus, shaft    right   . History of kidney stones    40 years ago  . Hyperlipidemia 04/30/14  . Hypertension   . Impingement syndrome of right shoulder 12/29/15  . Personal history of radiation therapy   . Pneumonia    hx  . Rotator cuff tear 04/21/16   right  . Seizures ([place])    . Sleep apnea    Past Surgical History:  Procedure Laterality Date  . ABDOMINAL HYSTERECTOMY    . COLONOSCOPY WITH PROPOFOL N/A 06/01/2020   Procedure: COLONOSCOPY WITH PROPOFOL;  Surgeon: Lin Landsman, MD;  Location: [facility] ENDOSCOPY;  Service: Gastroenterology;  Laterality: N/A;  . ESOPHAGOGASTRODUODENOSCOPY (EGD) WITH PROPOFOL N/A 06/01/2020   Procedure: ESOPHAGOGASTRODUODENOSCOPY (EGD) WITH PROPOFOL;  Surgeon: Lin Landsman, MD;  Location: [facility] ENDOSCOPY;  Service: Gastroenterology;  Laterality: N/A;  . 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 EYE SURGERY     bilateral cataracts  . JOINT REPLACEMENT     bilateral knee replacement  . NASAL SEPTUM SURGERY    . OOPHORECTOMY    . REPLACEMENT TOTAL KNEE Bilateral 2004  . SHOULDER ARTHROSCOPY WITH BICEPSTENOTOMY Right 05/25/2016   Procedure: SHOULDER ARTHROSCOPY WITH BICEPSTENOTOMY;  Surgeon: Leanor Kail, MD;  Location: [facility] ORS;  Service: Orthopedics;  Laterality: Right;  . SHOULDER ARTHROSCOPY WITH DISTAL CLAVICLE RESECTION Right 05/25/2016   Procedure: SHOULDER ARTHROSCOPY WITH DISTAL CLAVICLE RESECTION;  Surgeon: Leanor Kail, MD;  Location: [facility] ORS;  Service: Orthopedics;  Laterality: Right;  . SHOULDER ARTHROSCOPY WITH OPEN ROTATOR CUFF REPAIR Right 05/25/2016   Procedure: SHOULDER ARTHROSCOPY WITH OPEN ROTATOR CUFF REPAIR;  Surgeon: Leanor Kail, MD;  Location: [facility] ORS;  Service:  Orthopedics;  Laterality: Right;  . SUBACROMIAL DECOMPRESSION Right 05/25/2016   Procedure: SUBACROMIAL DECOMPRESSION;  Surgeon: Leanor Kail, MD;  Location: [facility] ORS;  Service: Orthopedics;  Laterality: Right;   Social History:  reports that she quit smoking about 15 years ago. She has a 40.00 pack-year smoking history. She has never used smokeless tobacco. She reports that she does not drink alcohol and does not use drugs.  Allergies  Allergen Reactions  . Iodinated Diagnostic Agents Anaphylaxis    Other  reaction(s): Other (See Comments) Throat swells and extreme hives  . Lamictal [Lamotrigine] Rash    Suspected drug rash 09/2020  . Latex Itching  . Phenobarbital Hives  . Tape Rash    silicones   Family History  Problem Relation Age of Onset  . Breast cancer Paternal Aunt 57  . Diabetes Father   . Heart disease Father   . Breast cancer Daughter 83   Family history: Family history reviewed and not pertinent  Prior to Admission medications   Medication Sig Start Date End Date Taking? Authorizing Provider  albuterol (ACCUNEB) 1.25 MG/3ML nebulizer solution Take 3 mLs (1.25 mg total) by nebulization every 6 (six) hours as needed. wheezing 05/03/19   Allyne Gee, MD  atorvastatin (LIPITOR) 40 MG tablet Take 40 mg by mouth daily. 04/04/16   [provider]  diphenhydrAMINE (BENADRYL) 25 mg capsule Take 25 mg by mouth daily.    [provider]  fluticasone (FLONASE) 50 MCG/ACT nasal spray Place 2 sprays into both nostrils daily. 08/08/19   Kendell Bane, NP  furosemide (LASIX) 40 MG tablet Take 1 tablet (40 mg total) by mouth daily. 09/14/20   Lin Landsman, MD  hydroxyurea (HYDREA) 500 MG capsule TAKE ONE CAPSULE BY MOUTH TWICE DAILY ON MONDAYS ONLY, 1 CAPSULE DAILY ON TUESDAY TO SUNDAY. MAY TAKE WITH FOOD TO MINIMIZE GI SIDE EFFECTS 10/15/20   Lequita Asal, MD  levETIRAcetam (KEPPRA) 500 MG tablet Take 500 mg by mouth 2 (two) times daily. 06/05/20   [provider]  metFORMIN (GLUCOPHAGE) 500 MG tablet Take 500 mg by mouth 2 (two) times daily with a meal. 12/25/15   [provider]  montelukast (SINGULAIR) 10 MG tablet TAKE 1 TABLET BY MOUTH EVERY DAY Patient taking differently: Take 10 mg by mouth at bedtime.  07/02/15   Kathrine Haddock, NP  Multiple Vitamin (MULTI-VITAMINS) TABS Take 1 tablet by mouth daily.    [provider]  OXYGEN Inhale 2 L into the lungs.    [provider]  potassium chloride SA (KLOR-CON) 20 MEQ  tablet Take 20 mEq by mouth daily. 08/23/20   [provider]  sitaGLIPtin (JANUVIA) 25 MG tablet Take 1 tablet by mouth daily. 01/01/18   [provider]  traZODone (DESYREL) 100 MG tablet Take by mouth. 07/22/20   [provider]  umeclidinium-vilanterol (ANORO ELLIPTA) 62.5-25 MCG/INH AEPB Inhale 1 puff into the lungs daily. 02/06/20   Kendell Bane, NP  valsartan-hydrochlorothiazide (DIOVAN-HCT) 320-25 MG tablet Take 1 tablet by mouth daily. 04/14/16   [provider]  VENTOLIN HFA 108 (90 Base) MCG/ACT inhaler INHALE 2 PUFFS INTO THE LUNGS EVERY 6 HOURS AS NEEDED Patient taking differently: Inhale 2 puffs into the lungs every 6 (six) hours as needed for wheezing or shortness of breath.  02/18/19   Kendell Bane, NP   Physical Exam: Vitals:   12/19/2020 1556 12/22/2020 1722 12/16/2020 1735 01/05/2021 1800  BP:  (!) 161/67 140/66 (!) 160/58  Pulse:   (!) 119 (!) 123  Resp:   (!) 34 (!) 34  Temp:      TempSrc:      SpO2:   93% 94%  Weight: 104.5 kg     Height: 5\' 7"  (1.702 m)      Constitutional: appears age-appropriate, NAD, calm and mildly confused Eyes: PERRL, lids and conjunctivae normal ENMT: Mucous membranes are dry. Posterior pharynx clear of any exudate or lesions. Age-appropriate dentition. Hearing appropriate Neck: normal, supple, no masses, no thyromegaly Respiratory: clear to auscultation bilaterally, no wheezing, no crackles. Normal respiratory effort. No accessory muscle use.  Cardiovascular: Regular rate and rhythm, no murmurs / rubs / gallops.  Anasarca. 2+ pedal pulses. No carotid bruits.  Abdomen: no tenderness, no masses palpated, no hepatosplenomegaly. Bowel sounds positive.  Musculoskeletal: no clubbing / cyanosis. No joint deformity upper and lower extremities. Good ROM, no contractures, no atrophy. Normal muscle tone.  Skin: no rashes, lesions, ulcers. No induration Neurologic: Sensation intact. Strength 5/5 in all 4.  Psychiatric:  Normal judgment and insight. Alert and oriented x 3. Normal mood.   EKG: independently reviewed, showing atrial fibrillation with rate of 116, QTc 435  Chest x-ray on Admission: I personally reviewed and I agree with radiologist reading as below.  CT Head Wo Contrast  Result Date: 12/19/2020 CLINICAL DATA:  Mental status change, unknown cause; altered, on Eliquis, evaluate ICH. EXAM: CT HEAD WITHOUT CONTRAST TECHNIQUE: Contiguous axial images were obtained from the base of the skull through the vertex without intravenous contrast. COMPARISON:  No pertinent prior exams available for comparison. FINDINGS: Brain: Mild cerebral and cerebellar atrophy. There is no acute intracranial hemorrhage. No demarcated cortical infarct. No extra-axial fluid collection. No evidence of intracranial mass. No midline shift. Vascular: No hyperdense vessel.  Atherosclerotic calcifications. Skull: Normal. Negative for fracture or focal lesion. Sinuses/Orbits: Visualized orbits show no acute finding. Small calcification along the posterior aspect of the left globe. No significant paranasal sinus disease at the imaged levels. IMPRESSION: No evidence of acute intracranial abnormality. Mild generalized atrophy of the brain. 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 Electronically Signed   By: Kellie Simmering DO   On: 12/19/2020 16:43   DG Chest Portable 1 View  Result Date: 12/16/2020 CLINICAL DATA:  COPD/CHF exacerbation EXAM: PORTABLE CHEST 1 VIEW COMPARISON:  09/28/2020 FINDINGS: Background emphysema. Interstitial prominence with patchy increased density bilaterally. Small bilateral pleural effusions with bibasilar atelectasis/consolidation. Stable cardiomegaly. No pneumothorax. IMPRESSION: Emphysema likely with superimposed edema or atypical pneumonia. Small bilateral pleural effusions. Electronically Signed   By: Macy Mis M.D.   On: 12/19/2020 16:18   Labs on Admission: I  have personally reviewed following labs  CBC: Recent Labs  Lab 12/22/2020 1604  WBC 23.1*  NEUTROABS 20.7*  HGB 11.6*  HCT 39.4  MCV 92.9  PLT 409*   Basic Metabolic Panel: Recent Labs  Lab 12/28/2020 1604  NA 140  K 5.5*  CL 101  CO2 25  GLUCOSE 123*  BUN 42*  CREATININE 2.12*  CALCIUM 10.6*  MG 2.2   GFR: Estimated Creatinine Clearance: 27.6 mL/min (A) (by C-G formula based on SCr of 2.12 mg/dL (H)).  Liver Function Tests: Recent Labs  Lab 12/25/2020 1604  AST 33  ALT 15  ALKPHOS 88  BILITOT 1.1  PROT 6.8  ALBUMIN 3.8   Urine analysis:    Component Value Date/Time   COLORURINE YELLOW (A) 10/14/2020 [place] (  A) 10/14/2020 0729   LABSPEC 1.009 10/14/2020 0729   PHURINE 5.0 10/14/2020 0729   GLUCOSEU NEGATIVE 10/14/2020 0729   HGBUR LARGE (A) 10/14/2020 0729   BILIRUBINUR NEGATIVE 10/14/2020 0729   KETONESUR NEGATIVE 10/14/2020 0729   PROTEINUR NEGATIVE 10/14/2020 0729   NITRITE NEGATIVE 10/14/2020 0729   LEUKOCYTESUR NEGATIVE 10/14/2020 0729   Novaleigh Kohlman N Adarian Bur D.O. [facility]  If 7PM-7AM, please contact overnight-coverage provider If 7AM-7PM, please contact day coverage provider [email or web address]  01/01/2021, 8:29 PM

## 2020-12-24 ENCOUNTER — Inpatient Hospital Stay: Payer: Medicare Other

## 2020-12-24 ENCOUNTER — Inpatient Hospital Stay
Admit: 2020-12-24 | Discharge: 2020-12-24 | Disposition: A | Discharge status: Home / Self Care | Payer: Medicare Other | Attending: Internal Medicine | Admitting: Internal Medicine

## 2020-12-24 ENCOUNTER — Encounter: Payer: Self-pay | Admitting: Internal Medicine

## 2020-12-24 DIAGNOSIS — J9601 Acute respiratory failure with hypoxia: Secondary | ICD-10-CM

## 2020-12-24 DIAGNOSIS — J9622 Acute and chronic respiratory failure with hypercapnia: Secondary | ICD-10-CM | POA: Diagnosis present

## 2020-12-24 DIAGNOSIS — G9340 Encephalopathy, unspecified: Secondary | ICD-10-CM

## 2020-12-24 DIAGNOSIS — K7460 Unspecified cirrhosis of liver: Secondary | ICD-10-CM

## 2020-12-24 DIAGNOSIS — R601 Generalized edema: Secondary | ICD-10-CM | POA: Diagnosis not present

## 2020-12-24 DIAGNOSIS — A419 Sepsis, unspecified organism: Secondary | ICD-10-CM

## 2020-12-24 DIAGNOSIS — R6520 Severe sepsis without septic shock: Secondary | ICD-10-CM

## 2020-12-24 DIAGNOSIS — A0472 Enterocolitis due to Clostridium difficile, not specified as recurrent: Secondary | ICD-10-CM

## 2020-12-24 DIAGNOSIS — Z7189 Other specified counseling: Secondary | ICD-10-CM | POA: Diagnosis not present

## 2020-12-24 DIAGNOSIS — J9621 Acute and chronic respiratory failure with hypoxia: Secondary | ICD-10-CM | POA: Diagnosis present

## 2020-12-24 DIAGNOSIS — G9341 Metabolic encephalopathy: Secondary | ICD-10-CM | POA: Diagnosis not present

## 2020-12-24 DIAGNOSIS — N179 Acute kidney failure, unspecified: Secondary | ICD-10-CM

## 2020-12-24 DIAGNOSIS — E7220 Disorder of urea cycle metabolism, unspecified: Secondary | ICD-10-CM | POA: Diagnosis present

## 2020-12-24 DIAGNOSIS — K7031 Alcoholic cirrhosis of liver with ascites: Secondary | ICD-10-CM | POA: Diagnosis not present

## 2020-12-24 DIAGNOSIS — R188 Other ascites: Secondary | ICD-10-CM

## 2020-12-24 DIAGNOSIS — I5033 Acute on chronic diastolic (congestive) heart failure: Secondary | ICD-10-CM | POA: Diagnosis not present

## 2020-12-24 DIAGNOSIS — J189 Pneumonia, unspecified organism: Secondary | ICD-10-CM | POA: Diagnosis present

## 2020-12-24 LAB — CBC
HCT: 37.8 % (ref 36.0–46.0)
Hemoglobin: 11 g/dL — ABNORMAL LOW (ref 12.0–15.0)
MCH: 27.2 pg (ref 26.0–34.0)
MCHC: 29.1 g/dL — ABNORMAL LOW (ref 30.0–36.0)
MCV: 93.6 fL (ref 80.0–100.0)
Platelets: 563 10*3/uL — ABNORMAL HIGH (ref 150–400)
RBC: 4.04 MIL/uL (ref 3.87–5.11)
RDW: 16.8 % — ABNORMAL HIGH (ref 11.5–15.5)
WBC: 21.8 10*3/uL — ABNORMAL HIGH (ref 4.0–10.5)
nRBC: 0 % (ref 0.0–0.2)

## 2020-12-24 LAB — BLOOD CULTURE ID PANEL (REFLEXED) - BCID2

## 2020-12-24 LAB — BLOOD GAS, ARTERIAL
Acid-Base Excess: 1.3 mmol/L (ref 0.0–2.0)
Acid-Base Excess: 1.6 mmol/L (ref 0.0–2.0)
Acid-base deficit: 1.7 mmol/L (ref 0.0–2.0)
Bicarbonate: 28 mmol/L (ref 20.0–28.0)
Bicarbonate: 28.9 mmol/L — ABNORMAL HIGH (ref 20.0–28.0)
Bicarbonate: 28.7 mmol/L — ABNORMAL HIGH (ref 20.0–28.0)
Delivery systems: POSITIVE
Expiratory PAP: 10
FIO2: 1
FIO2: 0.4
FIO2: 0.4
Inspiratory PAP: 20
MECHVT: 500 mL
Mechanical Rate: 16
O2 Saturation: 90.3 %
O2 Saturation: 88.9 %
O2 Saturation: 90.3 %
PEEP: 5 cmH2O
Patient temperature: 37
Patient temperature: 37
Patient temperature: 37
RATE: 10 resp/min
RATE: 16 resp/min
pCO2 arterial: 75 mmHg (ref 32.0–48.0)
pCO2 arterial: 60 mmHg — ABNORMAL HIGH (ref 32.0–48.0)
pCO2 arterial: 57 mmHg — ABNORMAL HIGH (ref 32.0–48.0)
pH, Arterial: 7.18 — CL (ref 7.350–7.450)
pH, Arterial: 7.29 — ABNORMAL LOW (ref 7.350–7.450)
pH, Arterial: 7.31 — ABNORMAL LOW (ref 7.350–7.450)
pO2, Arterial: 74 mmHg — ABNORMAL LOW (ref 83.0–108.0)
pO2, Arterial: 63 mmHg — ABNORMAL LOW (ref 83.0–108.0)
pO2, Arterial: 65 mmHg — ABNORMAL LOW (ref 83.0–108.0)

## 2020-12-24 LAB — ECHOCARDIOGRAM COMPLETE
AR max vel: 2.15 cm2
AV Area VTI: 2.12 cm2
AV Area mean vel: 1.82 cm2
AV Mean grad: 8 mmHg
AV Peak grad: 12.4 mmHg
Ao pk vel: 1.76 m/s
Area-P 1/2: 4.09 cm2
Height: 67 in
MV VTI: 1.93 cm2
S' Lateral: 3.1 cm
Weight: 3686.09 oz

## 2020-12-24 LAB — BASIC METABOLIC PANEL WITH GFR
Anion gap: 12 (ref 5–15)
BUN: 42 mg/dL — ABNORMAL HIGH (ref 8–23)
CO2: 19 mmol/L — ABNORMAL LOW (ref 22–32)
Calcium: 8.3 mg/dL — ABNORMAL LOW (ref 8.9–10.3)
Chloride: 110 mmol/L (ref 98–111)
Creatinine, Ser: 1.84 mg/dL — ABNORMAL HIGH (ref 0.44–1.00)
GFR, Estimated: 28 mL/min — ABNORMAL LOW
Glucose, Bld: 118 mg/dL — ABNORMAL HIGH (ref 70–99)
Potassium: 5 mmol/L (ref 3.5–5.1)
Sodium: 141 mmol/L (ref 135–145)

## 2020-12-24 LAB — GLUCOSE, CAPILLARY
Glucose-Capillary: 102 mg/dL — ABNORMAL HIGH (ref 70–99)
Glucose-Capillary: 121 mg/dL — ABNORMAL HIGH (ref 70–99)
Glucose-Capillary: 122 mg/dL — ABNORMAL HIGH (ref 70–99)
Glucose-Capillary: 133 mg/dL — ABNORMAL HIGH (ref 70–99)

## 2020-12-24 LAB — BODY FLUID CELL COUNT WITH DIFFERENTIAL
Eos, Fluid: 0 %
Lymphs, Fluid: 12 %
Monocyte-Macrophage-Serous Fluid: 31 %
Neutrophil Count, Fluid: 57 %
Total Nucleated Cell Count, Fluid: 360 cu mm

## 2020-12-24 LAB — PROCALCITONIN: Procalcitonin: 0.24 ng/mL

## 2020-12-24 LAB — BRAIN NATRIURETIC PEPTIDE: B Natriuretic Peptide: 2715.4 pg/mL — ABNORMAL HIGH (ref 0.0–100.0)

## 2020-12-24 LAB — BASIC METABOLIC PANEL
Anion gap: 15 (ref 5–15)
BUN: 54 mg/dL — ABNORMAL HIGH (ref 8–23)
CO2: 24 mmol/L (ref 22–32)
Calcium: 10.2 mg/dL (ref 8.9–10.3)
Chloride: 101 mmol/L (ref 98–111)
Creatinine, Ser: 2.45 mg/dL — ABNORMAL HIGH (ref 0.44–1.00)
GFR, Estimated: 20 mL/min — ABNORMAL LOW (ref >60)
Glucose, Bld: 134 mg/dL — ABNORMAL HIGH (ref 70–99)
Potassium: 6.3 mmol/L (ref 3.5–5.1)
Sodium: 140 mmol/L (ref 135–145)

## 2020-12-24 LAB — GLUCOSE, PLEURAL OR PERITONEAL FLUID: Glucose, Fluid: 144 mg/dL

## 2020-12-24 LAB — SARS CORONAVIRUS 2 (TAT 6-24 HRS): SARS Coronavirus 2: NEGATIVE

## 2020-12-24 LAB — PROTEIN, PLEURAL OR PERITONEAL FLUID: Total protein, fluid: 3.4 g/dL

## 2020-12-24 LAB — AMMONIA: Ammonia: 58 umol/L — ABNORMAL HIGH (ref 9–35)

## 2020-12-24 MED ORDER — ACETAMINOPHEN 650 MG RE SUPP: 325.0000 mg | Freq: Four times a day (QID) | RECTAL | Status: AC | PRN

## 2020-12-24 MED ORDER — CHLORHEXIDINE GLUCONATE 0.12 % MT SOLN
15.0000 mL | Freq: Two times a day (BID) | OROMUCOSAL | Status: DC
  Administered 2020-12-24 – 2020-12-25 (×2): 15 mL via OROMUCOSAL
  Filled 2020-12-24: qty 15

## 2020-12-24 MED ORDER — ADULT MULTIVITAMIN W/MINERALS CH: 1.0000 | ORAL_TABLET | Freq: Every day | ORAL | Status: DC

## 2020-12-24 MED ORDER — HYDROXYUREA 500 MG PO CAPS: 500.0000 mg | ORAL_CAPSULE | ORAL | Status: DC

## 2020-12-24 MED ORDER — ACETAMINOPHEN 325 MG PO TABS: 325.0000 mg | ORAL_TABLET | Freq: Four times a day (QID) | ORAL | Status: AC | PRN

## 2020-12-24 MED ORDER — FENTANYL 2500MCG IN NS 250ML (10MCG/ML) PREMIX INFUSION
25.0000 ug/h | INTRAVENOUS | Status: DC
  Administered 2020-12-24: 50 ug/h via INTRAVENOUS
  Administered 2020-12-25: 125 ug/h via INTRAVENOUS
  Administered 2020-12-26: 150 ug/h via INTRAVENOUS
  Administered 2020-12-27: 75 ug/h via INTRAVENOUS
  Filled 2020-12-24 (×4): qty 250

## 2020-12-24 MED ORDER — ALBUMIN HUMAN 25 % IV SOLN
25.0000 g | Freq: Once | INTRAVENOUS | Status: AC
  Administered 2020-12-24: 25 g via INTRAVENOUS
  Filled 2020-12-24: qty 100

## 2020-12-24 MED ORDER — HYDROXYUREA 100 MG/ML ORAL SUSPENSION: 500.0000 mg | Freq: Every day | ORAL | Status: DC

## 2020-12-24 MED ORDER — FENTANYL CITRATE (PF) 100 MCG/2ML IJ SOLN: 100.0000 ug | Freq: Once | INTRAMUSCULAR | Status: AC

## 2020-12-24 MED ORDER — CHLORHEXIDINE GLUCONATE 0.12% ORAL RINSE (MEDLINE KIT)
15.0000 mL | Freq: Two times a day (BID) | OROMUCOSAL | Status: DC
  Administered 2020-12-25 – 2020-12-29 (×8): 15 mL via OROMUCOSAL

## 2020-12-24 MED ORDER — FAMOTIDINE IN NACL 20-0.9 MG/50ML-% IV SOLN
20.0000 mg | Freq: Two times a day (BID) | INTRAVENOUS | Status: DC
Start: 2020-12-24 — End: 2020-12-24

## 2020-12-24 MED ORDER — ETOMIDATE 2 MG/ML IV SOLN
INTRAVENOUS | Status: AC
  Filled 2020-12-24: qty 10

## 2020-12-24 MED ORDER — HYDROXYUREA 500 MG PO CAPS: 500.0000 mg | ORAL_CAPSULE | Freq: Every day | ORAL | Status: DC

## 2020-12-24 MED ORDER — UMECLIDINIUM-VILANTEROL 62.5-25 MCG/INH IN AEPB
1.0000 | INHALATION_SPRAY | Freq: Every day | RESPIRATORY_TRACT | Status: DC
  Filled 2020-12-24: qty 14

## 2020-12-24 MED ORDER — HYDROXYUREA 100 MG/ML ORAL SUSPENSION: 500.0000 mg | ORAL | Status: DC

## 2020-12-24 MED ORDER — ONDANSETRON HCL 4 MG/2ML IJ SOLN: 4.0000 mg | Freq: Four times a day (QID) | INTRAMUSCULAR | Status: DC | PRN

## 2020-12-24 MED ORDER — IPRATROPIUM-ALBUTEROL 0.5-2.5 (3) MG/3ML IN SOLN: 3.0000 mL | RESPIRATORY_TRACT | Status: DC | PRN

## 2020-12-24 MED ORDER — ONDANSETRON HCL 4 MG PO TABS: 4.0000 mg | ORAL_TABLET | Freq: Four times a day (QID) | ORAL | Status: DC | PRN

## 2020-12-24 MED ORDER — ROCURONIUM BROMIDE 50 MG/5ML IV SOLN
INTRAVENOUS | Status: AC
  Administered 2020-12-24: 50 mg via INTRAVENOUS
  Filled 2020-12-24: qty 1

## 2020-12-24 MED ORDER — FUROSEMIDE 10 MG/ML IJ SOLN
60.0000 mg | Freq: Once | INTRAMUSCULAR | Status: AC
  Administered 2020-12-24: 60 mg via INTRAVENOUS
  Filled 2020-12-24: qty 6

## 2020-12-24 MED ORDER — ETOMIDATE 2 MG/ML IV SOLN
20.0000 mg | Freq: Once | INTRAVENOUS | Status: DC
  Filled 2020-12-24: qty 10

## 2020-12-24 MED ORDER — FENTANYL CITRATE (PF) 100 MCG/2ML IJ SOLN
INTRAMUSCULAR | Status: AC
  Administered 2020-12-24: 100 ug via INTRAVENOUS
  Filled 2020-12-24: qty 2

## 2020-12-24 MED ORDER — CHLORHEXIDINE GLUCONATE CLOTH 2 % EX PADS
6.0000 | MEDICATED_PAD | Freq: Every day | CUTANEOUS | Status: DC
  Administered 2020-12-24 – 2020-12-28 (×5): 6 via TOPICAL

## 2020-12-24 MED ORDER — HYDROXYUREA 100 MG/ML ORAL SUSPENSION
500.0000 mg | Freq: Every day | ORAL | Status: DC
  Filled 2020-12-24 (×3): qty 5

## 2020-12-24 MED ORDER — MIDAZOLAM HCL 2 MG/2ML IJ SOLN
2.0000 mg | INTRAMUSCULAR | Status: DC | PRN
  Administered 2020-12-24 – 2020-12-25 (×2): 2 mg via INTRAVENOUS
  Filled 2020-12-24: qty 2

## 2020-12-24 MED ORDER — ATORVASTATIN CALCIUM 20 MG PO TABS
40.0000 mg | ORAL_TABLET | Freq: Every day | ORAL | Status: DC
  Administered 2020-12-25 – 2020-12-29 (×5): 40 mg
  Filled 2020-12-24 (×5): qty 2

## 2020-12-24 MED ORDER — HYDROXYUREA 500 MG PO CAPS
500.0000 mg | ORAL_CAPSULE | Freq: Every day | ORAL | Status: DC
  Filled 2020-12-24 (×2): qty 1

## 2020-12-24 MED ORDER — ROCURONIUM BROMIDE 50 MG/5ML IV SOLN: 50.0000 mg | Freq: Once | INTRAVENOUS | Status: AC

## 2020-12-24 MED ORDER — FENTANYL CITRATE (PF) 100 MCG/2ML IJ SOLN
25.0000 ug | Freq: Once | INTRAMUSCULAR | Status: AC
  Administered 2020-12-24: 25 ug via INTRAVENOUS
  Filled 2020-12-24: qty 2

## 2020-12-24 MED ORDER — DOCUSATE SODIUM 50 MG/5ML PO LIQD
100.0000 mg | Freq: Two times a day (BID) | ORAL | Status: DC
  Administered 2020-12-25 – 2020-12-29 (×7): 100 mg
  Filled 2020-12-24 (×7): qty 10

## 2020-12-24 MED ORDER — LACTULOSE ENEMA
300.0000 mL | Freq: Two times a day (BID) | ORAL | Status: DC
  Filled 2020-12-24 (×3): qty 300

## 2020-12-24 MED ORDER — ORAL CARE MOUTH RINSE
15.0000 mL | OROMUCOSAL | Status: DC
  Administered 2020-12-24 – 2020-12-29 (×46): 15 mL via OROMUCOSAL

## 2020-12-24 MED ORDER — POLYETHYLENE GLYCOL 3350 17 G PO PACK
17.0000 g | PACK | Freq: Every day | ORAL | Status: DC
  Administered 2020-12-25 – 2020-12-26 (×2): 17 g
  Filled 2020-12-24 (×2): qty 1

## 2020-12-24 MED ORDER — NYSTATIN 100000 UNIT/GM EX CREA
TOPICAL_CREAM | Freq: Two times a day (BID) | CUTANEOUS | Status: DC
  Administered 2020-12-29: 1 via TOPICAL
  Filled 2020-12-24 (×2): qty 15

## 2020-12-24 MED ORDER — ORAL CARE MOUTH RINSE: 15.0000 mL | Freq: Two times a day (BID) | OROMUCOSAL | Status: DC

## 2020-12-24 MED ORDER — MONTELUKAST SODIUM 10 MG PO TABS
10.0000 mg | ORAL_TABLET | Freq: Every day | ORAL | Status: DC
  Administered 2020-12-25 – 2020-12-27 (×3): 10 mg
  Filled 2020-12-24 (×3): qty 1

## 2020-12-24 MED ORDER — LACTULOSE ENEMA: 300.0000 mL | Freq: Two times a day (BID) | ORAL | Status: DC

## 2020-12-24 MED ORDER — NOREPINEPHRINE 4 MG/250ML-% IV SOLN
0.0000 ug/min | INTRAVENOUS | Status: DC
  Filled 2020-12-24: qty 250

## 2020-12-24 MED ORDER — LINAGLIPTIN 5 MG PO TABS
5.0000 mg | ORAL_TABLET | Freq: Every day | ORAL | Status: DC
  Administered 2020-12-25: 5 mg
  Filled 2020-12-24 (×3): qty 1

## 2020-12-24 MED ORDER — FAMOTIDINE IN NACL 20-0.9 MG/50ML-% IV SOLN
20.0000 mg | INTRAVENOUS | Status: DC
  Administered 2020-12-24 – 2020-12-28 (×5): 20 mg via INTRAVENOUS
  Filled 2020-12-24 (×6): qty 50

## 2020-12-24 MED ORDER — FENTANYL BOLUS VIA INFUSION
25.0000 ug | INTRAVENOUS | Status: DC | PRN
  Administered 2020-12-25: 25 ug via INTRAVENOUS
  Filled 2020-12-24: qty 25

## 2020-12-24 MED ORDER — ADULT MULTIVITAMIN LIQUID CH
15.0000 mL | Freq: Every day | ORAL | Status: DC
  Administered 2020-12-25 – 2020-12-28 (×4): 15 mL
  Filled 2020-12-24 (×5): qty 15

## 2020-12-24 NOTE — Progress Notes (Signed)
*  PRELIMINARY RESULTS* Echocardiogram 2D Echocardiogram has been performed.  Angelica Juarez 12/24/2020, 10:48 AM

## 2020-12-24 NOTE — ED Notes (Signed)
Respiratory called for ABG

## 2020-12-24 NOTE — ED Notes (Addendum)
Dr. Arbutus Ped aware of bed status, per Dr. Arbutus Ped, ok to send pt to floor.  If pt condition deteriorates, provider will move to another unit.  Only IV meds given d/t pt increased lethargic state, all PO held, provider aware.

## 2020-12-24 NOTE — Procedures (Signed)
Intubation Procedure Note  CARRISSA TAITANO  [id number]  [birth year]  Date:12/24/20  Time:5:48 PM   Provider Performing:Travor Royce D Dewaine Conger    Procedure: Intubation (68257)  Indication(s) Respiratory Failure  Consent Unable to obtain consent due to emergent nature of procedure.   Anesthesia Etomidate, Fentanyl and Rocuronium   Time Out Verified patient identification, verified procedure, site/side was marked, verified correct patient position, special equipment/implants available, medications/allergies/relevant history reviewed, required imaging and test results available.   Sterile Technique Usual hand hygeine, masks, and gloves were used   Procedure Description Patient positioned in bed supine.  Sedation given as noted above.  Patient was intubated with endotracheal tube using Glidescope.  View was Grade 1 full glottis .  Number of attempts was 1.  Colorimetric CO2 detector was consistent with tracheal placement.   Complications/Tolerance None; patient tolerated the procedure well. Chest X-ray is ordered to verify placement.   EBL Minimal   Specimen(s) None  Size 8.5 ETT  Secured at 22 cm at the lip.   Darel Hong, AGACNP-BC  Pulmonary & Critical Care Medicine Pager: [phone]

## 2020-12-24 NOTE — Progress Notes (Signed)
[place] patient's spouse made aware of plane of care for patient and current code status. Informed spouse of patient's current condition and spouse stated "we've discussed code status alreadyNurse, mental health told spouse that MD would reach out again regarding code status. Orders in to transfer patient to CCU. Report given to nurse. Writer bladder scan patient and >450 mL of urine was shown. MD aware. See new orders.

## 2020-12-24 NOTE — Progress Notes (Signed)
PHARMACY - PHYSICIAN COMMUNICATION CRITICAL VALUE ALERT - BLOOD CULTURE IDENTIFICATION (BCID)  Angelica Juarez is an 80 y.o. female who presented to [facility] on 12/24/2020   Name of physician (or Provider) Contacted: Nicole Kindred  Current antibiotics: Zithromax and Rocephin  Changes to prescribed antibiotics recommended:  Patient is on recommended antibiotics - No changes needed  Results for orders placed or performed during the hospital encounter of 01/09/2021  Blood Culture ID Panel (Reflexed) (Collected: 01/02/2021  4:58 PM)  Result Value Ref Range   Enterococcus faecalis NOT DETECTED NOT DETECTED   Enterococcus Faecium NOT DETECTED NOT DETECTED   Listeria monocytogenes NOT DETECTED NOT DETECTED   Staphylococcus species NOT DETECTED NOT DETECTED   Staphylococcus aureus (BCID) NOT DETECTED NOT DETECTED   Staphylococcus epidermidis NOT DETECTED NOT DETECTED   Staphylococcus lugdunensis NOT DETECTED NOT DETECTED   Streptococcus species NOT DETECTED NOT DETECTED   Streptococcus agalactiae NOT DETECTED NOT DETECTED   Streptococcus pneumoniae NOT DETECTED NOT DETECTED   Streptococcus pyogenes NOT DETECTED NOT DETECTED   A.calcoaceticus-baumannii NOT DETECTED NOT DETECTED   Bacteroides fragilis NOT DETECTED NOT DETECTED   Enterobacterales NOT DETECTED NOT DETECTED   Enterobacter cloacae complex NOT DETECTED NOT DETECTED   Escherichia coli NOT DETECTED NOT DETECTED   Klebsiella aerogenes NOT DETECTED NOT DETECTED   Klebsiella oxytoca NOT DETECTED NOT DETECTED   Klebsiella pneumoniae NOT DETECTED NOT DETECTED   Proteus species NOT DETECTED NOT DETECTED   Salmonella species NOT DETECTED NOT DETECTED   Serratia marcescens NOT DETECTED NOT DETECTED   Haemophilus influenzae NOT DETECTED NOT DETECTED   Neisseria meningitidis NOT DETECTED NOT DETECTED   Pseudomonas aeruginosa NOT DETECTED NOT DETECTED   Stenotrophomonas maltophilia NOT DETECTED NOT DETECTED   Candida albicans NOT DETECTED  NOT DETECTED   Candida auris NOT DETECTED NOT DETECTED   Candida glabrata NOT DETECTED NOT DETECTED   Candida krusei NOT DETECTED NOT DETECTED   Candida parapsilosis NOT DETECTED NOT DETECTED   Candida tropicalis NOT DETECTED NOT DETECTED   Cryptococcus neoformans/gattii NOT DETECTED NOT DETECTED    Berta Minor 12/24/2020  5:10 PM

## 2020-12-24 NOTE — Progress Notes (Incomplete)
Patient admitted to unit at this time. MD Arbutus Ped following.

## 2020-12-24 NOTE — Progress Notes (Signed)
Rapid called upon patient coming back on floor following thoracentesis. Patient continues to be only responsive to painful stimuli. RRT at bedside and put in orders and obtained another ABG level. Continuous BIPAP remains in place. See orders. Spouse made aware of changes.

## 2020-12-24 NOTE — ED Notes (Signed)
Updated husband on pt condition, and POC today.  Advised IR would be calling him to obtain consent for paracentesis.  Understanding verbalized.

## 2020-12-24 NOTE — ED Notes (Signed)
Pt resting comfortably a this time. NAD noted. Pt is currently on 6L/min via [place], pt currently mouth breathing at this time. Call bell in reach.

## 2020-12-24 NOTE — ED Notes (Signed)
Arbutus Ped, DO at bedside.

## 2020-12-24 NOTE — Consult Note (Signed)
Name: Angelica Juarez MRN: [id number] DOB: 1941/02/13     CONSULTATION DATE: 12/21/2020  REFERRING MD :  Arbutus Ped  CHIEF COMPLAINT:  resp failure     HISTORY OF PRESENT ILLNESS:   80 year old female with past medical history of polycythemia vera, CKD stage III, COPD on 2 L/min home oxygen, A. fib not on anticoagulation due to history of retroperitoneal bleed, HFpEF, hypertension   presented to the ED on 12/24/2020 with altered mentation.   Husband reported 2 to 3 weeks of patient not acting herself, not wanting to ambulate to the bathroom which she usually does with his assistance, and not taking her medications.    Patient was oriented to herself and place on admission she was unaware however of soiling herself or the reason she was here.  Admission Labs notable for leukocytosis, AKI with creatinine 2.2, hypercalcemia 10.6, hyperkalemia 5.5, BNP elevated 853, hs-troponin 81>> 80, thrombocytosis.  Negative for COVID-19.    Chest x-ray - emphysema likely with superimposed edema or atypical pneumonia.  Small bilateral pleural effusions.  + criteria for severe sepsis with A. fib with RVR with heart rates in the 120s tachypnea.   Acute on chronic hypoxic respiratory failure requiring initially nonrebreather mask and later 6 L/min nasal cannula oxygen, and AKI reflect organ dysfunction and severe sepsis in the setting of atypical pneumonia.     2/9 admitted for Pneumonia 2/10 patient developed acute resp failure and severe mental status 2/10 US paracentesis 2L yellow fluid removed 2/10 emergently intubated failed biPAP   CBC    Component Value Date/Time   WBC 21.8 (H) 12/24/2020 0626   RBC 4.04 12/24/2020 0626   HGB 11.0 (L) 12/24/2020 0626   HGB 13.3 02/20/2014 2351   HCT 37.8 12/24/2020 0626   HCT 41.0 02/20/2014 2351   PLT 563 (H) 12/24/2020 0626   PLT 397 02/20/2014 2351   MCV 93.6 12/24/2020 0626   MCV 96 02/20/2014 2351   MCH 27.2 12/24/2020 0626   MCHC 29.1 (L)  12/24/2020 0626   RDW 16.8 (H) 12/24/2020 0626   RDW 13.4 02/20/2014 2351   LYMPHSABS 0.2 (L) 12/30/2020 1604   LYMPHSABS 0.9 (L) 09/24/2012 1309   MONOABS 1.4 (H) 12/19/2020 1604   MONOABS 0.5 09/24/2012 1309   EOSABS 0.1 12/26/2020 1604   EOSABS 0.5 09/24/2012 1309   BASOSABS 0.2 (H) 01/02/2021 1604   BASOSABS 1 09/25/2012 0425   BASOSABS 0.1 09/24/2012 1309   BMP Latest Ref Rng & Units 12/24/2020 12/24/2020 12/15/2020  Glucose 70 - 99 mg/dL 134(H) 118(H) 123(H)  BUN 8 - 23 mg/dL 54(H) 42(H) 42(H)  Creatinine 0.44 - 1.00 mg/dL 2.45(H) 1.84(H) 2.12(H)  Sodium 135 - 145 mmol/L 140 141 140  Potassium 3.5 - 5.1 mmol/L 6.3(HH) 5.0 5.5(H)  Chloride 98 - 111 mmol/L 101 110 101  CO2 22 - 32 mmol/L 24 19(L) 25  Calcium 8.9 - 10.3 mg/dL 10.2 8.3(L) 10.6(H)    PAST MEDICAL HISTORY :   has a past medical history of Allergy, Arthritis, Cancer ([place]), Chronic kidney disease, Colon polyps (05/21/14), COPD (chronic obstructive pulmonary disease) ([place]), Diabetes mellitus without complication ([place]), Dyspnea, Fracture closed, humerus, shaft, History of kidney stones, Hyperlipidemia (04/30/14), Hypertension, Impingement syndrome of right shoulder (12/29/15), Personal history of radiation therapy, Pneumonia, Rotator cuff tear (04/21/16), Seizures ([place]), and Sleep apnea.  has a past surgical history that includes Abdominal hysterectomy; Exploratory laparotomy; Joint replacement; Nasal septum surgery; Eye surgery; Shoulder arthroscopy with open rotator cuff repair (Right, 05/25/2016); Subacromial  decompression (Right, 05/25/2016); Shoulder arthroscopy with bicepstenotomy (Right, 05/25/2016); Shoulder arthroscopy with distal clavicle resection (Right, 05/25/2016); Replacement total knee (Bilateral, 2004); Oophorectomy; Colonoscopy with propofol (N/A, 06/01/2020); and Esophagogastroduodenoscopy (egd) with propofol (N/A, 06/01/2020). Prior to Admission medications   Medication Sig Start Date End Date Taking? Authorizing Provider   albuterol (VENTOLIN HFA) 108 (90 Base) MCG/ACT inhaler Inhale 2 puffs into the lungs every 6 (six) hours as needed for wheezing or shortness of breath.    [provider]  apixaban (ELIQUIS) 5 MG TABS tablet Take 5 mg by mouth 2 (two) times daily.    [provider]  atorvastatin (LIPITOR) 40 MG tablet Take 40 mg by mouth daily. 04/04/16   [provider]  fluticasone (FLONASE) 50 MCG/ACT nasal spray Place 2 sprays into both nostrils daily. 08/08/19   Kendell Bane, NP  furosemide (LASIX) 40 MG tablet Take 1 tablet (40 mg total) by mouth daily. 09/14/20   Lin Landsman, MD  hydroxyurea (HYDREA) 500 MG capsule TAKE ONE CAPSULE BY MOUTH TWICE DAILY ON MONDAYS ONLY, 1 CAPSULE DAILY ON TUESDAY TO SUNDAY. MAY TAKE WITH FOOD TO MINIMIZE GI SIDE EFFECTS 10/15/20   Lequita Asal, MD  metFORMIN (GLUCOPHAGE) 500 MG tablet Take 500 mg by mouth 2 (two) times daily with a meal. 12/25/15   [provider]  montelukast (SINGULAIR) 10 MG tablet TAKE 1 TABLET BY MOUTH EVERY DAY Patient taking differently: Take 10 mg by mouth at bedtime. 07/02/15   Kathrine Haddock, NP  NIFEdipine (ADALAT CC) 30 MG 24 hr tablet Take 30 mg by mouth daily.    [provider]  OXYGEN Inhale 2 L into the lungs.    [provider]  potassium chloride SA (KLOR-CON) 20 MEQ tablet Take 20 mEq by mouth daily. 08/23/20   [provider]  sitaGLIPtin (JANUVIA) 25 MG tablet Take 1 tablet by mouth daily. 01/01/18   [provider]  traZODone (DESYREL) 100 MG tablet Take by mouth. 07/22/20   [provider]  umeclidinium-vilanterol (ANORO ELLIPTA) 62.5-25 MCG/INH AEPB Inhale 1 puff into the lungs daily. 02/06/20   Kendell Bane, NP  valsartan-hydrochlorothiazide (DIOVAN-HCT) 320-25 MG tablet Take 1 tablet by mouth daily. 04/14/16   [provider]   Allergies  Allergen Reactions  . Iodinated Diagnostic Agents Anaphylaxis    Other reaction(s): Other  (See Comments) Throat swells and extreme hives  . Lamictal [Lamotrigine] Rash    Suspected drug rash 09/2020  . Latex Itching  . Phenobarbital Hives  . Tape Rash    silicones    FAMILY HISTORY:  family history includes Breast cancer (age of onset: 49) in her daughter; Breast cancer (age of onset: 76) in her paternal aunt; Diabetes in her father; Heart disease in her father. SOCIAL HISTORY:  reports that she quit smoking about 15 years ago. She has a 40.00 pack-year smoking history. She has never used smokeless tobacco. She reports that she does not drink alcohol and does not use drugs.  REVIEW OF SYSTEMS:   Unable to obtain due to critical illness      Estimated body mass index is 35.43 kg/m as calculated from the following:   Height as of this encounter: 5\' 7"  (1.702 m).   Weight as of this encounter: 102.6 kg.    VITAL SIGNS: Temp:  [97.5 F (36.4 C)-98.4 F (36.9 C)] 98.1 F (36.7 C) (02/10 1700) Pulse Rate:  [98-123] 103 (02/10 1700) Resp:  [22-41] 34 (02/10 1253) BP: (125-160)/(48-93)  153/93 (02/10 1700) SpO2:  [91 %-98 %] 93 % (02/10 1700) FiO2 (%):  [40 %] 40 % (02/10 1700) Weight:  [102.6 kg] 102.6 kg (02/10 1700)   I/O last 3 completed shifts: In: 100 [IV Piggyback:100] Out: -  No intake/output data recorded.   SpO2: 93 % O2 Flow Rate (L/min): 15 L/min FiO2 (%): 40 %   Physical Examination:  GENERAL:critically ill appearing, +resp distress HEAD: Normocephalic, atraumatic.  EYES: Pupils equal, round, reactive to light.  No scleral icterus.  MOUTH: Moist mucosal membrane. NECK: Supple. No JVD.  PULMONARY: +rhonchi, +wheezing CARDIOVASCULAR: S1 and S2. Regular rate and rhythm. No murmurs, rubs, or gallops.  GASTROINTESTINAL: Soft, nontender, -distended.  Positive bowel sounds.  MUSCULOSKELETAL: + edema. Generalized anasarca NEUROLOGIC: obtunded SKIN:intact,warm,dry   MEDICATIONS: I have reviewed all medications and confirmed regimen as  documented   CULTURE RESULTS   Recent Results (from the past 240 hour(s))  SARS CORONAVIRUS 2 (TAT 6-24 HRS) Nasopharyngeal Nasopharyngeal Swab     Status: None   Collection Time: 12/17/2020  4:04 PM   Specimen: Nasopharyngeal Swab  Result Value Ref Range Status   SARS Coronavirus 2 NEGATIVE NEGATIVE Final    Comment: (NOTE) SARS-CoV-2 target nucleic acids are NOT DETECTED.  The SARS-CoV-2 RNA is generally detectable in upper and lower respiratory specimens during the acute phase of infection. Negative results do not preclude SARS-CoV-2 infection, do not rule out co-infections with other pathogens, and should not be used as the sole basis for treatment or other patient management decisions. Negative results must be combined with clinical observations, patient history, and epidemiological information. The expected result is Negative.  Fact Sheet for Patients: SugarRoll.be  Fact Sheet for Healthcare Providers: https://www.woods-mathews.com/  This test is not yet approved or cleared by the [place] FDA and  has been authorized for detection and/or diagnosis of SARS-CoV-2 by FDA under an Emergency Use Authorization (EUA). This EUA will remain  in effect (meaning this test can be used) for the duration of the COVID-19 declaration under Se ction 564(b)(1) of the Act, 21 U.S.C. section 360bbb-3(b)(1), unless the authorization is terminated or revoked sooner.  Performed at [facility], [place] [address], [place], [place] [address]   Culture, blood (single)     Status: None (Preliminary result)   Collection Time: 01/01/2021  4:58 PM   Specimen: BLOOD  Result Value Ref Range Status   Specimen Description BLOOD RIGHT ANTECUBITAL  Final   Special Requests   Final    BOTTLES DRAWN AEROBIC AND ANAEROBIC Blood Culture adequate volume   Culture  Setup Time   Final    Organism ID to follow GRAM POSITIVE COCCI AEROBIC BOTTLE ONLY CRITICAL  RESULT CALLED TO, READ BACK BY AND VERIFIED WITH: Laqueta Carina PHARMD AT 7096 ON 12/24/20 SNG Performed at [facility], [address] [place], [place] [address]    Culture GRAM POSITIVE COCCI  Final   Report Status PENDING  Incomplete  Blood Culture ID Panel (Reflexed)     Status: None   Collection Time: 12/28/2020  4:58 PM  Result Value Ref Range Status   Enterococcus faecalis NOT DETECTED NOT DETECTED Final   Enterococcus Faecium NOT DETECTED NOT DETECTED Final   Listeria monocytogenes NOT DETECTED NOT DETECTED Final   Staphylococcus species NOT DETECTED NOT DETECTED Final   Staphylococcus aureus (BCID) NOT DETECTED NOT DETECTED Final   Staphylococcus epidermidis NOT DETECTED NOT DETECTED Final   Staphylococcus lugdunensis NOT DETECTED NOT DETECTED Final  Streptococcus species NOT DETECTED NOT DETECTED Final   Streptococcus agalactiae NOT DETECTED NOT DETECTED Final   Streptococcus pneumoniae NOT DETECTED NOT DETECTED Final   Streptococcus pyogenes NOT DETECTED NOT DETECTED Final   A.calcoaceticus-baumannii NOT DETECTED NOT DETECTED Final   Bacteroides fragilis NOT DETECTED NOT DETECTED Final   Enterobacterales NOT DETECTED NOT DETECTED Final   Enterobacter cloacae complex NOT DETECTED NOT DETECTED Final   Escherichia coli NOT DETECTED NOT DETECTED Final   Klebsiella aerogenes NOT DETECTED NOT DETECTED Final   Klebsiella oxytoca NOT DETECTED NOT DETECTED Final   Klebsiella pneumoniae NOT DETECTED NOT DETECTED Final   Proteus species NOT DETECTED NOT DETECTED Final   Salmonella species NOT DETECTED NOT DETECTED Final   Serratia marcescens NOT DETECTED NOT DETECTED Final   Haemophilus influenzae NOT DETECTED NOT DETECTED Final   Neisseria meningitidis NOT DETECTED NOT DETECTED Final   Pseudomonas aeruginosa NOT DETECTED NOT DETECTED Final   Stenotrophomonas maltophilia NOT DETECTED NOT DETECTED Final   Candida albicans NOT DETECTED NOT DETECTED Final   Candida auris NOT  DETECTED NOT DETECTED Final   Candida glabrata NOT DETECTED NOT DETECTED Final   Candida krusei NOT DETECTED NOT DETECTED Final   Candida parapsilosis NOT DETECTED NOT DETECTED Final   Candida tropicalis NOT DETECTED NOT DETECTED Final   Cryptococcus neoformans/gattii NOT DETECTED NOT DETECTED Final    Comment: Performed at [facility], [address], [place], [place] [address]          IMAGING    US Paracentesis  Result Date: 12/24/2020 INDICATION: Abdominal distension.  Evaluate for ascites and paracentesis. EXAM: ULTRASOUND GUIDED  PARACENTESIS MEDICATIONS: None. COMPLICATIONS: None immediate. PROCEDURE: Patient has altered mental status and telephone consent was obtained from the patient's significant other. Initial ultrasound scanning demonstrates a moderate amount of ascites identified in the left abdomen. The left abdomen was prepped and draped in the usual sterile fashion. 1% lidocaine was used for local anesthesia. Following this, a 6 Fr Safe-T-Centesis catheter was introduced. An ultrasound image was saved for documentation purposes. The paracentesis was performed. The catheter was removed and a dressing was applied. The patient tolerated the procedure well without immediate post procedural complication. Patient received post-procedure intravenous albumin; see nursing notes for details. FINDINGS: A total of approximately 2 L of dark yellow fluid was removed. Samples were sent to the laboratory as requested by the clinical team. IMPRESSION: Successful ultrasound-guided paracentesis yielding 2 liters of peritoneal fluid. Electronically Signed   By: Markus Daft M.D.   On: 12/24/2020 15:27   ECHOCARDIOGRAM COMPLETE  Result Date: 12/24/2020    ECHOCARDIOGRAM REPORT   Patient Name:   Angelica [place] Date of Exam: 12/24/2020 Medical Rec #:  [id number]   Height:       67.0 in Accession #:    [id number]  Weight:       230.4 lb Date of Birth:  17-Sep-1941   BSA:          2.148 m Patient Age:     60 years    BP:           137/65 mmHg Patient Gender: F           HR:           115 bpm. Exam Location:  [facility] Procedure: 2D Echo, Color Doppler and Cardiac Doppler Indications:     I50.9 Congestive Heart Failure  History:         Patient has prior history of Echocardiogram examinations.  COPD                  and CKD; Risk Factors:Sleep Apnea, Hypertension, Dyslipidemia                  and Diabetes.  Sonographer:     Charmayne Sheer RDCS (AE) Referring Phys:  [id number] AMY N COX Diagnosing Phys: Bartholome Bill MD  Sonographer Comments: Suboptimal subcostal window. Image acquisition challenging due to COPD. IMPRESSIONS  1. Left ventricular ejection fraction, by estimation, is 60 to 65%. The left ventricle has normal function. The left ventricle has no regional wall motion abnormalities. Left ventricular diastolic parameters were normal.  2. Right ventricular systolic function is normal. The right ventricular size is mildly enlarged.  3. Left atrial size was mildly dilated.  4. Right atrial size was moderately dilated.  5. The mitral valve is grossly normal. Mild to moderate mitral valve regurgitation.  6. Tricuspid valve regurgitation is moderate.  7. The aortic valve is calcified. Aortic valve regurgitation is trivial. FINDINGS  Left Ventricle: Left ventricular ejection fraction, by estimation, is 60 to 65%. The left ventricle has normal function. The left ventricle has no regional wall motion abnormalities. The left ventricular internal cavity size was normal in size. There is  no left ventricular hypertrophy. Left ventricular diastolic parameters were normal. Right Ventricle: The right ventricular size is mildly enlarged. No increase in right ventricular wall thickness. Right ventricular systolic function is normal. Left Atrium: Left atrial size was mildly dilated. Right Atrium: Right atrial size was moderately dilated. Pericardium: There is no evidence of pericardial effusion. Mitral Valve: The mitral valve is grossly  normal. Mild to moderate mitral valve regurgitation. MV peak gradient, 12.2 mmHg. The mean mitral valve gradient is 5.0 mmHg. Tricuspid Valve: The tricuspid valve is not well visualized. Tricuspid valve regurgitation is moderate. Aortic Valve: The aortic valve is calcified. Aortic valve regurgitation is trivial. Aortic valve mean gradient measures 8.0 mmHg. Aortic valve peak gradient measures 12.4 mmHg. Aortic valve area, by VTI measures 2.12 cm. Pulmonic Valve: The pulmonic valve was not well visualized. Pulmonic valve regurgitation is trivial. Aorta: The aortic root is normal in size and structure. IAS/Shunts: No atrial level shunt detected by color flow Doppler.  LEFT VENTRICLE PLAX 2D LVIDd:         4.80 cm  Diastology LVIDs:         3.10 cm  LV e' medial:    6.53 cm/s LV PW:         0.90 cm  LV E/e' medial:  20.2 LV IVS:        1.00 cm  LV e' lateral:   12.80 cm/s LVOT diam:     1.80 cm  LV E/e' lateral: 10.3 LV SV:         58 LV SV Index:   27 LVOT Area:     2.54 cm  RIGHT VENTRICLE RV Basal diam:  3.70 cm RV Mid diam:    4.50 cm TAPSE (M-mode): 1.7 cm LEFT ATRIUM             Index       RIGHT ATRIUM           Index LA diam:        4.90 cm 2.28 cm/m  RA Area:     28.10 cm LA Vol (A2C):   66.1 ml 30.78 ml/m RA Volume:   95.40 ml  44.42 ml/m LA Vol (A4C):   58.0 ml 27.01  ml/m LA Biplane Vol: 64.9 ml 30.22 ml/m  AORTIC VALVE                    PULMONIC VALVE AV Area (Vmax):    2.15 cm     PV Vmax:       1.34 m/s AV Area (Vmean):   1.82 cm     PV Vmean:      81.200 cm/s AV Area (VTI):     2.12 cm     PV VTI:        0.179 m AV Vmax:           176.00 cm/s  PV Peak grad:  7.2 mmHg AV Vmean:          130.000 cm/s PV Mean grad:  3.0 mmHg AV VTI:            0.273 m AV Peak Grad:      12.4 mmHg AV Mean Grad:      8.0 mmHg LVOT Vmax:         149.00 cm/s LVOT Vmean:        93.200 cm/s LVOT VTI:          0.227 m LVOT/AV VTI ratio: 0.83  AORTA Ao Root diam: 2.80 cm MITRAL VALVE                TRICUSPID VALVE MV  Area (PHT): 4.09 cm     TR Peak grad:   35.8 mmHg MV Area VTI:   1.93 cm     TR Vmax:        299.00 cm/s MV Peak grad:  12.2 mmHg MV Mean grad:  5.0 mmHg     SHUNTS MV Vmax:       1.75 m/s     Systemic VTI:  0.23 m MV Vmean:      103.0 cm/s   Systemic Diam: 1.80 cm MV Decel Time: 186 msec MV E velocity: 132.00 cm/s Bartholome Bill MD Electronically signed by Bartholome Bill MD Signature Date/Time: 12/24/2020/1:21:54 PM    Final      Nutrition Status:           Indwelling Urinary Catheter continued, requirement due to   Reason to continue Indwelling Urinary Catheter strict Intake/Output monitoring for hemodynamic instability         Ventilator continued, requirement due to severe respiratory failure   Ventilator Sedation RASS 0 to -2      ASSESSMENT AND PLAN SYNOPSIS  80 yo morbidly obese white female with acute decompensation of liver cirrhosis with progressive renal failure leading to severe hypoxic resp failure with pulm edema  Severe ACUTE Hypoxic and Hypercapnic Respiratory Failure -continue Full MV support -continue Bronchodilator Therapy -Wean Fio2 and PEEP as tolerated -VAP/VENT bundle implementation  ACUTE  CARDIAC FAILURE- HFrEF -oxygen as needed -Lasix as tolerated   Morbid obesity, possible OSA.   Will certainly impact respiratory mechanics, ventilator weaning Suspect will need to consider additional PEEP, possible extubation to BiPAP when appropriate to consider   ACUTE KIDNEY INJURY/Renal Failure -continue Foley Catheter-assess need -Avoid nephrotoxic agents -Follow urine output, BMP -Ensure adequate renal perfusion, optimize oxygenation -Renal dose medications Hd catheter to be placed for HD      NEUROLOGY Acute toxic metabolic encephalopathy, need for sedation Goal RASS -2 to -3  CARDIAC ICU monitoring  ID -continue IV abx as prescibed -follow up cultures  GI GI PROPHYLAXIS as indicated  NUTRITIONAL STATUS DIET-->NPO Constipation  protocol as indicated  ENDO - will use ICU hypoglycemic\Hyperglycemia protocol if needed    ELECTROLYTES -follow labs as needed -replace as needed -pharmacy consultation and following    DVT/GI PRX ordered and assessed TRANSFUSIONS AS NEEDED MONITOR FSBS I Assessed the need for Labs I Assessed the need for Foley I Assessed the need for Central Venous Line Family Discussion when available I Assessed the need for Mobilization I made an Assessment of medications to be adjusted accordingly Safety Risk assessment Completed  CASE DISCUSSED IN MULTIDISCIPLINARY ROUNDS WITH ICU TEAM   Critical Care Time devoted to patient care services described in this note is 75 minutes.   Overall, patient is critically ill, prognosis is guarded.  Patient with Multiorgan failure and at high risk for cardiac arrest and death.   Prognosis is very poor  Corrin Parker, M.D.  [facility]  Medical Director [place] Director [facility] Cardio-Pulmonary Department

## 2020-12-24 NOTE — Progress Notes (Signed)
Patient down to radiology to obtain ordered thoracentesis. RT in place and at bedside with transportation personnel.

## 2020-12-24 NOTE — Progress Notes (Signed)
1700: Received the patient from the floor on BIPAP. She is lethargic but arousable, following commands. She moves all extremities.   Dr. Mortimer Fries assessed patient and ordered to intubate. Patient pre-medicated with Fentanyl 100 mcg, Etomidate 20 mg and Rocuronium 50 mg.  Foley catheter and OGT inserted.  Dialysis access inserted by NP. X-ray done. BP stable at this time. Levophed order in place.   Shift report given to [facility].

## 2020-12-24 NOTE — Procedures (Signed)
Central Venous Catheter Insertion Procedure Note  KAMIRAH SHUGRUE  [id number]  01/17/1941  Date:12/24/20  Time:6:45 PM   Provider Performing:Erica Richwine D Dewaine Conger   Procedure: Insertion of Non-tunneled Central Venous Catheter(36556)with US guidance (94709)    Indication(s) Difficult access and Hemodialysis  Consent Unable to obtain consent due to emergent nature of procedure.  Anesthesia Topical only with 1% lidocaine   Timeout Verified patient identification, verified procedure, site/side was marked, verified correct patient position, special equipment/implants available, medications/allergies/relevant history reviewed, required imaging and test results available.  Sterile Technique Maximal sterile technique including full sterile barrier drape, hand hygiene, sterile gown, sterile gloves, mask, hair covering, sterile ultrasound probe cover (if used).  Procedure Description Area of catheter insertion was cleaned with chlorhexidine and draped in sterile fashion.   With real-time ultrasound guidance a HD catheter was placed into the left internal jugular vein.  Nonpulsatile blood flow and easy flushing noted in all ports.  The catheter was sutured in place and sterile dressing applied.  Complications/Tolerance None; patient tolerated the procedure well. Chest X-ray is ordered to verify placement for internal jugular or subclavian cannulation.  Chest x-ray is not ordered for femoral cannulation.  EBL Minimal  Specimen(s) None   Line secured at the 20 cm mark.  BIOPATCH applied to the insertion site.    Darel Hong, AGACNP-BC [facility] Pager: [phone]

## 2020-12-24 NOTE — Progress Notes (Signed)
Rapid called upon patient coming back on floor following thoracentesis. Patient continues to be only responsive to painful stimuli. RRT at bedside and put in orders and obtained another ABG level. BP and HR stable. Afebrile. Continuous BIPAP remains in place. No urine output noted.

## 2020-12-24 NOTE — Progress Notes (Signed)
CODE STATUS -  Pt admitted as FULL code here recently in December 2021.  This admission was made PARTIAL Code (Do Not Intubate) on admission. Given patient's tenuous clinical status today, I spoke at length this afternoon with her significant other of 30 years (also HPOA) regarding code status, and he indicated patient should again be FULL code, as she was in December.  He felt that he may have been confused about what was asked in discussion on admission, but stated that, since December, no changes to patient's goals of care have occurred, and that patient would want "a chance to improve" if her breathing worsened.    Code status has been changed.  Transfer to [facility] for closer monitoring given persistent obtundation and tenuous clinical status at this time.

## 2020-12-24 NOTE — ED Notes (Signed)
Lab at bedside

## 2020-12-24 NOTE — Progress Notes (Signed)
Rapid Response Event Note   Reason for Call :   AMS  Initial Focused Assessment:   - Patient is on BiPAP,  - Obtunded - HR 105 + ST, RR 30, 156/70(93), 95%  - CBG WNL.  Interventions:   - ABG obtained - Upgraded level of care to SDU  Plan of Care:   - Transfer to CCU  Event Summary:   MD Notified: Dr. Arbutus Ped Call Time: 5701 hrs Arrival Time: 1520 hrs End Time: 7793 hrs  Jesse Sans, RN

## 2020-12-24 NOTE — ED Notes (Signed)
Bladder scan showed 221 mL. Arbutus Ped, DO aware.

## 2020-12-24 NOTE — ED Notes (Signed)
Pt transferred in to hospital bed at this time, repositioned, clean Chux placed under pt. purwick in place

## 2020-12-24 NOTE — ED Notes (Signed)
Pt becoming increasing lethargic.  Messaged provider.

## 2020-12-24 NOTE — Procedures (Signed)
Interventional Radiology Procedure:   Indications: Abdominal distention.  Procedure: US guided paracentesis  Findings: Removed 2000 ml from left abdomen.  Complications: None     EBL: Less than 10 ml   Kierre Hintz R. Anselm Pancoast, MD  Pager: [phone]

## 2020-12-24 NOTE — Progress Notes (Signed)
Patient transferred at this time to room CCU-17 with all belongings. Continuous bipap intact. RT in place.

## 2020-12-24 NOTE — Progress Notes (Signed)
PROGRESS NOTE    Angelica Juarez   PXT:[id number]  DOB: January 23, 1941  PCP: Sofie Hartigan, MD    DOA: 12/27/2020 LOS: 1   Brief Narrative   80 year old female with past medical history of polycythemia vera, CKD stage III, COPD on 2 L/min home oxygen, A. fib not on anticoagulation due to history of retroperitoneal bleed, HFpEF, hypertension who presented to the ED on 12/22/2020 with altered mentation.  Husband reported 2 to 3 weeks of patient not acting herself, not wanting to ambulate to the bathroom which she usually does with his assistance, and not taking her medications.  Patient was oriented to herself and place on admission she was unaware however of soiling herself or the reason she was here.  Evaluation in the ED: Labs notable for leukocytosis, AKI with creatinine 2.2, hypercalcemia 10.6, hyperkalemia 5.5, BNP elevated 853, hs-troponin 81>> 80, thrombocytosis.  Negative for COVID-19.    Chest x-ray - emphysema likely with superimposed edema or atypical pneumonia.  Small bilateral pleural effusions.  Patient met criteria for severe sepsis with A. fib with RVR with heart rates in the 120s, tachypnea. Acute on chronic hypoxic respiratory failure requiring initially nonrebreather mask and later 6 L/min nasal cannula oxygen, and AKI reflect organ dysfunction and severe sepsis in the setting of atypical pneumonia.    Admitted to hospitalist service and started on empiric Rocephin and Zithromax for community-acquired pneumonia.  2/10 - day after admission, notified by RN in the ED that patient's mentation worsening, less responsive.  Found with respiratory acidosis and hypercarbia.  Started on BiPAP.  Ammonia found elevated as well. She'd been given IV Lasix in the AM, with no urine output since.  Repeat labs in afternoon showed worsened renal function and hyperkalemia.  Nephrology was consulted.  Patient's had improved pCO2 on repeat ABG, but patient remained mostly unresponsive.  Patient later  transferred to ICU and was intubated having failed BiPAP.     Assessment & Plan   Principal Problem:   Severe sepsis ([place]) Active Problems:   Anasarca   Acute metabolic encephalopathy   Hyperammonemia (HCC)   Atypical pneumonia   Acute on chronic respiratory failure with hypoxia and hypercapnia (HCC)   Acute respiratory failure with hypoxia and hypercapnia (HCC)   COPD (chronic obstructive pulmonary disease) (HCC)   Chronic kidney disease, stage 3a   Hypertension   Hyperlipidemia, unspecified   OSA on CPAP   Type 2 diabetes mellitus with microalbuminuria, without long-term current use of insulin (HCC)   Goals of care, counseling/discussion   Other persistent atrial fibrillation (HCC)   Chronic diarrhea of unknown origin   Acute on chronic diastolic CHF (congestive heart failure) (HCC)   Cirrhosis of liver with ascites (HCC)   AKI (acute kidney injury) ([place])   Severe sepsis due to presumed atypical pneumonia vs ?SBP - Present on admission with tachycardia, leukocytosis, tachypnea, in the setting of apparent pneumonia or other infection, ?SBP. Acute metabolic encephalopathy and AKI are consistent with organ dysfunction and severe sepsis. --Continue empiric Rocephin, Zithromax --Follow up peritoneal fluid cell counts, culture --No IV fluids given severe anasarca/volume overload  Anasarca - POA, has severe third-spacing with pitting edema from feet >> lower extremities >>proximally to her trunk and lateral rib cage.  No UO since AM IV Lasix, some urine seen on bladder scan. --Nephrology consulted --Trial IV Lasix preceded by IV albumin  Acute metabolic encephalopathy - POA and was the presenting complaint by history from patient's s/o.  Multifactorial due to  infection, hepatic encephalopathy, and hypercapnia.   --Treat underlying causes as above/below --Transfer to ICU/Stepdown since now full code --Case discussed with Dr. Mortimer Fries  Hyperammonemia - in pt with hx of  cirrhosis/ascites.  Ammonia 58.  Unable to give PO lactulose.  Will give lactulose enemas.  Recheck ammonia with AM labs.  Atypical pneumonia - continue empiric Rocephin, Zithromax. Procal is pending.  CXR findings could be edema, but with leukocytosis, covering for infection until other source/s ruled out.  Acute on chronic diastolic CHF - with severe volume overload that appears multifactorial.  No response to IV Lasix this AM.  Given severe thirdspacing, will trial repeat dose IV Lasix + Albumin.  D/w nephro.  Monitor renal function closely.  I/O's & daily wts. --May require dialysis for volume management   Acute on chronic respiratory failure with hypoxia and hypercapnia -  Started on BiPAP late this AM after worsened mentation and ABG with pCO2 of 75, pH 7.19.  Blood gas improved later, but mental status unchanged, obtunded.  Transferred to ICU.  Has been intubated.  Mgmt per PCCM.  AKI superimposed on CKD IIIa - Nephrology consulted and case discussed.  AKI present on admission, suspect related to sepsis. Renal function worse this afternoon.    Hyperkalemia - K 6.2 this afternoon on repeat labs.  Nephrology consulted.  Unable to give Veltasa by PO or NG since on BiPAP.  Can give if NG placed in ICU.  Nephrology agreed with trial Lasix+Albumin.  Monitor closely.    Candidiasis of left groin/pannus - POA, noted on exam today.  Check fungal blood culture given as fungemia in the differential for sepsis. Nystatin cream for now.  Monitor closely.  Consider systemic antifungal.  COPD - does not seem acutely exacerbated, no wheezing on exam.     Hypertension - PO meds held  Hyperlipidemia - PO meds held  OSA on CPAP - on BiPAP most of today, later intubated  Type 2 diabetes mellitus - sliding scale novlog  Other persistent atrial fibrillation - tachycardia / RVR due to sepsis.  Mgmt as above.  Eliquis on hold for now, NPO.  Chronic diarrhea of unknown origin - per chart review.  Pt had  no BM's since admission, so C diff screen was cancelled.    Cirrhosis of liver with ascites - s/p paracentesis with 2 L yellow fluid removed.  Follow peritoneal fluid cell counts & culture. On Rocephin as above for PNA.    Obesity: Body mass index is 35.43 kg/m.   Complicates overall care and prognosis.    DVT prophylaxis: heparin injection 5,000 Units Start: 12/19/2020 2200 Place TED hose Start: 12/23/2020 1800   Diet:  Diet Orders (From admission, onward)    Start     Ordered   12/24/20 1752  Diet NPO time specified  Diet effective now        12/24/20 1752            Code Status: Full Code    Subjective 12/24/20    Pt seen this AM in ED holding for a bed.  She is reportedly less responsive than earlier.  She opens her eyes for me, but unable to verbally respond to tell me her name.  Quickly drifts back to sleep.  ABG with hypercapnia >> BiPAP started  This afternoon, rapid response called for unresponsiveness.   Ammonia also elevated.  Spoke with patient's s/o about code status and he confirmed full code as she was when admitted here in December.  Pt transferred to ICU/stepdown.   Disposition Plan & Communication   Status is: Inpatient  Inpatient status is appropriate as patient is critically ill, in ICU with multisystem organ failure.  Dispo: The patient is from: home              Anticipated d/c is to: TBD              Anticipated d/c date is: >3 days              Patient currently is not medically stable for d/c.   Difficult to place patient: no    Family Communication: spoke with patient's s/o and HPOA, Elpidio Eric, this afternoon.  Updated on patient's condition, code status discussion as outlined in separate note.   Consults, Procedures, Significant Events   Consultants:   PCCM  Nephrology  Procedures:     Antimicrobials:  Anti-infectives (From admission, onward)   Start     Dose/Rate Route Frequency Ordered Stop   12/28/2020 1700  cefTRIAXone  (ROCEPHIN) 2 g in sodium chloride 0.9 % 100 mL IVPB        2 g 200 mL/hr over 30 Minutes Intravenous Every 24 hours 01/06/2021 1646     12/30/2020 1700  azithromycin (ZITHROMAX) 500 mg in sodium chloride 0.9 % 250 mL IVPB        500 mg 250 mL/hr over 60 Minutes Intravenous Every 24 hours 12/27/2020 1646          Objective   Vitals:   12/24/20 2030 12/24/20 2045 12/24/20 2051 12/24/20 2100  BP: (!) 134/55 (!) 127/33 (!) 131/46 (!) 127/44  Pulse: (!) 102 94 94 (!) 102  Resp: 18 17 17 17   Temp:      TempSrc:      SpO2: 99% 99% 99% 99%  Weight:      Height:        Intake/Output Summary (Last 24 hours) at 12/24/2020 2103 Last data filed at 12/24/2020 2000 Gross per 24 hour  Intake 432.43 ml  Output 565 ml  Net -132.57 ml   Filed Weights   12/15/2020 1556 12/24/20 1700  Weight: 104.5 kg 102.6 kg    Physical Exam:  General exam: lethargic, no acute distress, obese HEENT: atraumatic, moist mucus membranes, mouth breathing Respiratory system: CTAB anteriorly and laterally, no wheezes, or rhonchi, shallow inspirations with mild use of accessory muscles Cardiovascular system: normal S1/S2, tachycardic, diffuse pitting edema from feet proximally to trunk and rib cage.   Gastrointestinal system: soft but distended, can't feel a fluid wave, no grimacing on palpation, +bowel sounds. Central nervous system: lethargic, opens eyes to name, nonverbal Extremities: pitting edema, normal tone Skin: dry, intact, normal temperature, left groin /pannus erythema with scattered peripheral satellite lesions  Labs   Data Reviewed: I have personally reviewed following labs and imaging studies  CBC: Recent Labs  Lab 12/25/2020 1604 12/24/20 0626  WBC 23.1* 21.8*  NEUTROABS 20.7*  --   HGB 11.6* 11.0*  HCT 39.4 37.8  MCV 92.9 93.6  PLT 605* 832*   Basic Metabolic Panel: Recent Labs  Lab 01/08/2021 1604 12/24/20 0626 12/24/20 1620  NA 140 141 140  K 5.5* 5.0 6.3*  CL 101 110 101  CO2 25 19*  24  GLUCOSE 123* 118* 134*  BUN 42* 42* 54*  CREATININE 2.12* 1.84* 2.45*  CALCIUM 10.6* 8.3* 10.2  MG 2.2  --   --    GFR: Estimated Creatinine Clearance: 23.7 mL/min (A) (by C-G formula  based on SCr of 2.45 mg/dL (H)). Liver Function Tests: Recent Labs  Lab 01/06/2021 1604  AST 33  ALT 15  ALKPHOS 88  BILITOT 1.1  PROT 6.8  ALBUMIN 3.8   No results for input(s): LIPASE, AMYLASE in the last 168 hours. Recent Labs  Lab 12/24/20 1201  AMMONIA 58*   Coagulation Profile: No results for input(s): INR, PROTIME in the last 168 hours. Cardiac Enzymes: No results for input(s): CKTOTAL, CKMB, CKMBINDEX, TROPONINI in the last 168 hours. BNP (last 3 results) No results for input(s): PROBNP in the last 8760 hours. HbA1C: No results for input(s): HGBA1C in the last 72 hours. CBG: Recent Labs  Lab 12/24/20 1526 12/24/20 1713 12/24/20 1930  GLUCAP 133* 121* 122*   Lipid Profile: No results for input(s): CHOL, HDL, LDLCALC, TRIG, CHOLHDL, LDLDIRECT in the last 72 hours. Thyroid Function Tests: Recent Labs    01/02/2021 1803  TSH 2.914   Anemia Panel: No results for input(s): VITAMINB12, FOLATE, FERRITIN, TIBC, IRON, RETICCTPCT in the last 72 hours. Sepsis Labs: Recent Labs  Lab 12/30/2020 1604 12/24/20 1620  PROCALCITON  --  0.24  LATICACIDVEN 1.8  --     Recent Results (from the past 240 hour(s))  SARS CORONAVIRUS 2 (TAT 6-24 HRS) Nasopharyngeal Nasopharyngeal Swab     Status: None   Collection Time: 12/18/2020  4:04 PM   Specimen: Nasopharyngeal Swab  Result Value Ref Range Status   SARS Coronavirus 2 NEGATIVE NEGATIVE Final    Comment: (NOTE) SARS-CoV-2 target nucleic acids are NOT DETECTED.  The SARS-CoV-2 RNA is generally detectable in upper and lower respiratory specimens during the acute phase of infection. Negative results do not preclude SARS-CoV-2 infection, do not rule out co-infections with other pathogens, and should not be used as the sole basis for  treatment or other patient management decisions. Negative results must be combined with clinical observations, patient history, and epidemiological information. The expected result is Negative.  Fact Sheet for Patients: SugarRoll.be  Fact Sheet for Healthcare Providers: https://www.woods-mathews.com/  This test is not yet approved or cleared by the [place] FDA and  has been authorized for detection and/or diagnosis of SARS-CoV-2 by FDA under an Emergency Use Authorization (EUA). This EUA will remain  in effect (meaning this test can be used) for the duration of the COVID-19 declaration under Se ction 564(b)(1) of the Act, 21 U.S.C. section 360bbb-3(b)(1), unless the authorization is terminated or revoked sooner.  Performed at [facility], [place] [address] [place], [place] [address]   Culture, blood (single)     Status: None (Preliminary result)   Collection Time: 01/02/2021  4:58 PM   Specimen: BLOOD  Result Value Ref Range Status   Specimen Description BLOOD RIGHT ANTECUBITAL  Final   Special Requests   Final    BOTTLES DRAWN AEROBIC AND ANAEROBIC Blood Culture adequate volume   Culture  Setup Time   Final    Organism ID to follow GRAM POSITIVE COCCI AEROBIC BOTTLE ONLY CRITICAL RESULT CALLED TO, READ BACK BY AND VERIFIED WITH: Laqueta Carina PHARMD AT 8101 ON 12/24/20 SNG Performed at [facility], [address] [place], [place] [address]    Culture [facility] POSITIVE COCCI  Final   Report Status PENDING  Incomplete  Blood Culture ID Panel (Reflexed)     Status: None   Collection Time: 12/25/2020  4:58 PM  Result Value Ref Range Status   Enterococcus faecalis NOT DETECTED NOT DETECTED Final   Enterococcus Faecium NOT  DETECTED NOT DETECTED Final   Listeria monocytogenes NOT DETECTED NOT DETECTED Final   Staphylococcus species NOT DETECTED NOT DETECTED Final   Staphylococcus aureus (BCID) NOT DETECTED NOT DETECTED Final    Staphylococcus epidermidis NOT DETECTED NOT DETECTED Final   Staphylococcus lugdunensis NOT DETECTED NOT DETECTED Final   Streptococcus species NOT DETECTED NOT DETECTED Final   Streptococcus agalactiae NOT DETECTED NOT DETECTED Final   Streptococcus pneumoniae NOT DETECTED NOT DETECTED Final   Streptococcus pyogenes NOT DETECTED NOT DETECTED Final   A.calcoaceticus-baumannii NOT DETECTED NOT DETECTED Final   Bacteroides fragilis NOT DETECTED NOT DETECTED Final   Enterobacterales NOT DETECTED NOT DETECTED Final   Enterobacter cloacae complex NOT DETECTED NOT DETECTED Final   Escherichia coli NOT DETECTED NOT DETECTED Final   Klebsiella aerogenes NOT DETECTED NOT DETECTED Final   Klebsiella oxytoca NOT DETECTED NOT DETECTED Final   Klebsiella pneumoniae NOT DETECTED NOT DETECTED Final   Proteus species NOT DETECTED NOT DETECTED Final   Salmonella species NOT DETECTED NOT DETECTED Final   Serratia marcescens NOT DETECTED NOT DETECTED Final   Haemophilus influenzae NOT DETECTED NOT DETECTED Final   Neisseria meningitidis NOT DETECTED NOT DETECTED Final   Pseudomonas aeruginosa NOT DETECTED NOT DETECTED Final   Stenotrophomonas maltophilia NOT DETECTED NOT DETECTED Final   Candida albicans NOT DETECTED NOT DETECTED Final   Candida auris NOT DETECTED NOT DETECTED Final   Candida glabrata NOT DETECTED NOT DETECTED Final   Candida krusei NOT DETECTED NOT DETECTED Final   Candida parapsilosis NOT DETECTED NOT DETECTED Final   Candida tropicalis NOT DETECTED NOT DETECTED Final   Cryptococcus neoformans/gattii NOT DETECTED NOT DETECTED Final    Comment: Performed at [facility], [address] [place], [place] [address]      Imaging Studies   CT Head Wo Contrast  Result Date: 12/20/2020 CLINICAL DATA:  Mental status change, unknown cause; altered, on Eliquis, evaluate ICH. EXAM: CT HEAD WITHOUT CONTRAST TECHNIQUE: Contiguous axial images were obtained from the base of the  skull through the vertex without intravenous contrast. COMPARISON:  No pertinent prior exams available for comparison. FINDINGS: Brain: Mild cerebral and cerebellar atrophy. There is no acute intracranial hemorrhage. No demarcated cortical infarct. No extra-axial fluid collection. No evidence of intracranial mass. No midline shift. Vascular: No hyperdense vessel.  Atherosclerotic calcifications. Skull: Normal. Negative for fracture or focal lesion. Sinuses/Orbits: Visualized orbits show no acute finding. Small calcification along the posterior aspect of the left globe. No significant paranasal sinus disease at the imaged levels. IMPRESSION: No evidence of acute intracranial abnormality. Mild generalized atrophy of the brain. 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 Electronically Signed   By: Kellie Simmering DO   On: 12/27/2020 16:43   US Paracentesis  Result Date: 12/24/2020 INDICATION: Abdominal distension.  Evaluate for ascites and paracentesis. EXAM: ULTRASOUND GUIDED  PARACENTESIS MEDICATIONS: None. COMPLICATIONS: None immediate. PROCEDURE: Patient has altered mental status and telephone consent was obtained from the patient's significant other. Initial ultrasound scanning demonstrates a moderate amount of ascites identified in the left abdomen. The left abdomen was prepped and draped in the usual sterile fashion. 1% lidocaine was used for local anesthesia. Following this, a 6 Fr Safe-T-Centesis catheter was introduced. An ultrasound image was saved for documentation purposes. The paracentesis was performed. The catheter was removed and a dressing was applied. The patient tolerated the procedure well without immediate post procedural complication. Patient received post-procedure intravenous albumin; see nursing notes for  details. FINDINGS: A total of approximately 2 L of dark yellow fluid was removed. Samples were sent to the laboratory as requested by the  clinical team. IMPRESSION: Successful ultrasound-guided paracentesis yielding 2 liters of peritoneal fluid. Electronically Signed   By: Markus Daft M.D.   On: 12/24/2020 15:27   Portable Chest x-ray  Result Date: 12/24/2020 CLINICAL DATA:  Confirm endotracheal tube and central line placement. EXAM: PORTABLE CHEST 1 VIEW COMPARISON:  Chest radiograph December 23, 2020 FINDINGS: Endotracheal tube with tip overlying the midthoracic trachea. Nasogastric tube coursing below the diaphragm with tip obscured by collimation. Left IJ central venous catheter with tip overlying the SVC. Similar cardiomegaly. Aortic atherosclerosis. Increased diffuse interstitial opacities. Similar small bilateral pleural effusions. No pneumothorax the visualized skeletal structures are unchanged. IMPRESSION: 1. Endotracheal tube with tip overlying the midthoracic trachea. 2. New left IJ central venous catheter with tip overlying the SVC. No pneumothorax. 3. Increased diffuse interstitial opacities suggesting pulmonary edema. Similar small bilateral pleural effusions. Electronically Signed   By: Dahlia Bailiff MD   On: 12/24/2020 19:03   DG Chest Portable 1 View  Result Date: 12/18/2020 CLINICAL DATA:  COPD/CHF exacerbation EXAM: PORTABLE CHEST 1 VIEW COMPARISON:  09/28/2020 FINDINGS: Background emphysema. Interstitial prominence with patchy increased density bilaterally. Small bilateral pleural effusions with bibasilar atelectasis/consolidation. Stable cardiomegaly. No pneumothorax. IMPRESSION: Emphysema likely with superimposed edema or atypical pneumonia. Small bilateral pleural effusions. Electronically Signed   By: Macy Mis M.D.   On: 12/15/2020 16:18   ECHOCARDIOGRAM COMPLETE  Result Date: 12/24/2020    ECHOCARDIOGRAM REPORT   Patient Name:   Angelica Juarez Date of Exam: 12/24/2020 Medical Rec #:  [id number]   Height:       67.0 in Accession #:    [id number]  Weight:       230.4 lb Date of Birth:  October 09, 1941   BSA:          2.148  m Patient Age:    72 years    BP:           137/65 mmHg Patient Gender: F           HR:           115 bpm. Exam Location:  [facility] Procedure: 2D Echo, Color Doppler and Cardiac Doppler Indications:     I50.9 Congestive Heart Failure  History:         Patient has prior history of Echocardiogram examinations. COPD                  and CKD; Risk Factors:Sleep Apnea, Hypertension, Dyslipidemia                  and Diabetes.  Sonographer:     Charmayne Sheer RDCS (AE) Referring Phys:  [id number] AMY N COX Diagnosing Phys: Bartholome Bill MD  Sonographer Comments: Suboptimal subcostal window. Image acquisition challenging due to COPD. IMPRESSIONS  1. Left ventricular ejection fraction, by estimation, is 60 to 65%. The left ventricle has normal function. The left ventricle has no regional wall motion abnormalities. Left ventricular diastolic parameters were normal.  2. Right ventricular systolic function is normal. The right ventricular size is mildly enlarged.  3. Left atrial size was mildly dilated.  4. Right atrial size was moderately dilated.  5. The mitral valve is grossly normal. Mild to moderate mitral valve regurgitation.  6. Tricuspid valve regurgitation is moderate.  7. The aortic valve is calcified. Aortic valve regurgitation is trivial. FINDINGS  Left  Ventricle: Left ventricular ejection fraction, by estimation, is 60 to 65%. The left ventricle has normal function. The left ventricle has no regional wall motion abnormalities. The left ventricular internal cavity size was normal in size. There is  no left ventricular hypertrophy. Left ventricular diastolic parameters were normal. Right Ventricle: The right ventricular size is mildly enlarged. No increase in right ventricular wall thickness. Right ventricular systolic function is normal. Left Atrium: Left atrial size was mildly dilated. Right Atrium: Right atrial size was moderately dilated. Pericardium: There is no evidence of pericardial effusion. Mitral Valve: The mitral  valve is grossly normal. Mild to moderate mitral valve regurgitation. MV peak gradient, 12.2 mmHg. The mean mitral valve gradient is 5.0 mmHg. Tricuspid Valve: The tricuspid valve is not well visualized. Tricuspid valve regurgitation is moderate. Aortic Valve: The aortic valve is calcified. Aortic valve regurgitation is trivial. Aortic valve mean gradient measures 8.0 mmHg. Aortic valve peak gradient measures 12.4 mmHg. Aortic valve area, by VTI measures 2.12 cm. Pulmonic Valve: The pulmonic valve was not well visualized. Pulmonic valve regurgitation is trivial. Aorta: The aortic root is normal in size and structure. IAS/Shunts: No atrial level shunt detected by color flow Doppler.  LEFT VENTRICLE PLAX 2D LVIDd:         4.80 cm  Diastology LVIDs:         3.10 cm  LV e' medial:    6.53 cm/s LV PW:         0.90 cm  LV E/e' medial:  20.2 LV IVS:        1.00 cm  LV e' lateral:   12.80 cm/s LVOT diam:     1.80 cm  LV E/e' lateral: 10.3 LV SV:         58 LV SV Index:   27 LVOT Area:     2.54 cm  RIGHT VENTRICLE RV Basal diam:  3.70 cm RV Mid diam:    4.50 cm TAPSE (M-mode): 1.7 cm LEFT ATRIUM             Index       RIGHT ATRIUM           Index LA diam:        4.90 cm 2.28 cm/m  RA Area:     28.10 cm LA Vol (A2C):   66.1 ml 30.78 ml/m RA Volume:   95.40 ml  44.42 ml/m LA Vol (A4C):   58.0 ml 27.01 ml/m LA Biplane Vol: 64.9 ml 30.22 ml/m  AORTIC VALVE                    PULMONIC VALVE AV Area (Vmax):    2.15 cm     PV Vmax:       1.34 m/s AV Area (Vmean):   1.82 cm     PV Vmean:      81.200 cm/s AV Area (VTI):     2.12 cm     PV VTI:        0.179 m AV Vmax:           176.00 cm/s  PV Peak grad:  7.2 mmHg AV Vmean:          130.000 cm/s PV Mean grad:  3.0 mmHg AV VTI:            0.273 m AV Peak Grad:      12.4 mmHg AV Mean Grad:      8.0 mmHg LVOT Vmax:  149.00 cm/s LVOT Vmean:        93.200 cm/s LVOT VTI:          0.227 m LVOT/AV VTI ratio: 0.83  AORTA Ao Root diam: 2.80 cm MITRAL VALVE                 TRICUSPID VALVE MV Area (PHT): 4.09 cm     TR Peak grad:   35.8 mmHg MV Area VTI:   1.93 cm     TR Vmax:        299.00 cm/s MV Peak grad:  12.2 mmHg MV Mean grad:  5.0 mmHg     SHUNTS MV Vmax:       1.75 m/s     Systemic VTI:  0.23 m MV Vmean:      103.0 cm/s   Systemic Diam: 1.80 cm MV Decel Time: 186 msec MV E velocity: 132.00 cm/s Bartholome Bill MD Electronically signed by Bartholome Bill MD Signature Date/Time: 12/24/2020/1:21:54 PM    Final      Medications   Scheduled Meds: . atorvastatin  40 mg Oral Daily  . chlorhexidine  15 mL Mouth Rinse BID  . Chlorhexidine Gluconate Cloth  6 each Topical Daily  . docusate  100 mg Per Tube BID  . etomidate      . etomidate  20 mg Intravenous Once  . fluticasone  2 spray Each Nare Daily  . heparin  5,000 Units Subcutaneous Q8H  . hydroxyurea  500 mg Oral Q breakfast  . [START ON 12/28/2020] hydroxyurea  500 mg Oral Weekly  . lactulose  300 mL Rectal BID  . linagliptin  5 mg Oral Daily  . [START ON 12/25/2020] mouth rinse  15 mL Mouth Rinse q12n4p  . montelukast  10 mg Oral QHS  . multivitamin with minerals  1 tablet Oral Daily  . polyethylene glycol  17 g Per Tube Daily  . umeclidinium-vilanterol  1 puff Inhalation Daily   Continuous Infusions: . albumin human    . azithromycin Stopped (01/10/2021 1923)  . cefTRIAXone (ROCEPHIN)  IV Stopped (12/31/2020 1759)  . famotidine (PEPCID) IV 20 mg (12/24/20 1930)  . fentaNYL infusion INTRAVENOUS 125 mcg/hr (12/24/20 2000)  . levETIRAcetam Stopped (12/24/20 0403)  . norepinephrine (LEVOPHED) Adult infusion Stopped (12/24/20 1929)       LOS: 1 day     CRITICAL CARE Performed by: Ezekiel Slocumb   Total critical care time: 75 minutes with > 50% spent at bedside and in coordinating of care with specialists and ancillary staff.   Critical care time was exclusive of separately billable procedures and treating other patients.   Critical care was necessary to treat or prevent imminent or  life-threatening deterioration. severe metabolic encephalopathy, acute hypoxic and hypercarbic respiratory failure, worsening renal failure, severe sepsis.   Critical care was time spent personally by me on the following activities: development of treatment plan with patient and/or surrogate as well as nursing, discussions with consultants, evaluation of patient's response to treatment, examination of patient, obtaining history from patient or surrogate, ordering and performing treatments and interventions, ordering and review of laboratory studies, ordering and review of radiographic studies, pulse oximetry and re-evaluation of patient's condition.    Ezekiel Slocumb, DO [facility]  12/24/2020, 9:03 PM      If 7PM-7AM, please contact night-coverage. How to contact the [facility] Attending or Consulting provider [place] or covering provider during after hours [place], for this patient?    1. Check  the care team in [facility] and look for a) attending/consulting [place] provider listed and b) the [facility] team listed 2. Log into [email or web address] and use [place]'s universal password to access. If you do not have the password, please contact the hospital operator. 3. Locate the [facility] provider you are looking for under [facility] and page to a number that you can be directly reached. 4. If you still have difficulty reaching the provider, please page the [facility] (Director on Call) for the Hospitalists listed on amion for assistance.

## 2020-12-24 NOTE — Hospital Course (Addendum)
80 year old female with past medical history of polycythemia vera, CKD stage III, COPD on 2 L/min home oxygen, A. fib not on anticoagulation due to history of retroperitoneal bleed, HFpEF, hypertension who presented to the ED on 12/26/2020 with altered mentation.  Husband reported 2 to 3 weeks of patient not acting herself, not wanting to ambulate to the bathroom which she usually does with his assistance, and not taking her medications.  Patient was oriented to herself and place on admission she was unaware however of soiling herself or the reason she was here.  Evaluation in the ED: Labs notable for leukocytosis, AKI with creatinine 2.2, hypercalcemia 10.6, hyperkalemia 5.5, BNP elevated 853, hs-troponin 81>> 80, thrombocytosis.  Negative for COVID-19.    Chest x-ray - emphysema likely with superimposed edema or atypical pneumonia.  Small bilateral pleural effusions.  Patient met criteria for severe sepsis with A. fib with RVR with heart rates in the 120s, tachypnea. Acute on chronic hypoxic respiratory failure requiring initially nonrebreather mask and later 6 L/min nasal cannula oxygen, and AKI reflect organ dysfunction and severe sepsis in the setting of atypical pneumonia.    Admitted to hospitalist service and started on empiric Rocephin and Zithromax for community-acquired pneumonia.  2/10 - day after admission, notified by RN in the ED that patient's mentation worsening, less responsive.  Found with respiratory acidosis and hypercarbia.  Started on BiPAP.  Ammonia found elevated as well. She'd been given IV Lasix in the AM, with no urine output since.  Repeat labs in afternoon showed worsened renal function and hyperkalemia.  Nephrology was consulted.  Patient's had improved pCO2 on repeat ABG, but patient remained mostly unresponsive.  Patient later transferred to ICU and was intubated having failed BiPAP.

## 2020-12-25 ENCOUNTER — Inpatient Hospital Stay: Payer: Medicare Other

## 2020-12-25 ENCOUNTER — Telehealth: Payer: Self-pay

## 2020-12-25 DIAGNOSIS — A419 Sepsis, unspecified organism: Secondary | ICD-10-CM | POA: Diagnosis not present

## 2020-12-25 DIAGNOSIS — R6520 Severe sepsis without septic shock: Secondary | ICD-10-CM | POA: Diagnosis not present

## 2020-12-25 DIAGNOSIS — Z515 Encounter for palliative care: Secondary | ICD-10-CM | POA: Diagnosis not present

## 2020-12-25 DIAGNOSIS — Z7189 Other specified counseling: Secondary | ICD-10-CM | POA: Diagnosis not present

## 2020-12-25 LAB — COMPREHENSIVE METABOLIC PANEL
ALT: 13 U/L (ref 0–44)
AST: 28 U/L (ref 15–41)
Albumin: 3.1 g/dL — ABNORMAL LOW (ref 3.5–5.0)
Alkaline Phosphatase: 62 U/L (ref 38–126)
Anion gap: 9 (ref 5–15)
BUN: 42 mg/dL — ABNORMAL HIGH (ref 8–23)
CO2: 28 mmol/L (ref 22–32)
Calcium: 9 mg/dL (ref 8.9–10.3)
Chloride: 104 mmol/L (ref 98–111)
Creatinine, Ser: 1.87 mg/dL — ABNORMAL HIGH (ref 0.44–1.00)
GFR, Estimated: 27 mL/min — ABNORMAL LOW (ref >60)
Glucose, Bld: 102 mg/dL — ABNORMAL HIGH (ref 70–99)
Potassium: 4.9 mmol/L (ref 3.5–5.1)
Sodium: 141 mmol/L (ref 135–145)
Total Bilirubin: 0.8 mg/dL (ref 0.3–1.2)
Total Protein: 5.5 g/dL — ABNORMAL LOW (ref 6.5–8.1)

## 2020-12-25 LAB — CBC WITH DIFFERENTIAL/PLATELET
Abs Immature Granulocytes: 0.16 10*3/uL — ABNORMAL HIGH (ref 0.00–0.07)
Basophils Absolute: 0 10*3/uL (ref 0.0–0.1)
Basophils Relative: 0 %
Eosinophils Absolute: 0 10*3/uL (ref 0.0–0.5)
Eosinophils Relative: 0 %
HCT: 32.4 % — ABNORMAL LOW (ref 36.0–46.0)
Hemoglobin: 9.8 g/dL — ABNORMAL LOW (ref 12.0–15.0)
Immature Granulocytes: 1 %
Lymphocytes Relative: 1 %
Lymphs Abs: 0.2 10*3/uL — ABNORMAL LOW (ref 0.7–4.0)
MCH: 27.4 pg (ref 26.0–34.0)
MCHC: 30.2 g/dL (ref 30.0–36.0)
MCV: 90.5 fL (ref 80.0–100.0)
Monocytes Absolute: 1.4 10*3/uL — ABNORMAL HIGH (ref 0.1–1.0)
Monocytes Relative: 8 %
Neutro Abs: 15.9 10*3/uL — ABNORMAL HIGH (ref 1.7–7.7)
Neutrophils Relative %: 90 %
Platelets: 402 10*3/uL — ABNORMAL HIGH (ref 150–400)
RBC: 3.58 MIL/uL — ABNORMAL LOW (ref 3.87–5.11)
RDW: 16.7 % — ABNORMAL HIGH (ref 11.5–15.5)
WBC: 17.6 10*3/uL — ABNORMAL HIGH (ref 4.0–10.5)
nRBC: 0.1 % (ref 0.0–0.2)

## 2020-12-25 LAB — MRSA PCR SCREENING: MRSA by PCR: NEGATIVE

## 2020-12-25 LAB — HEPATITIS B SURFACE ANTIGEN: Hepatitis B Surface Ag: NONREACTIVE

## 2020-12-25 LAB — PROCALCITONIN: Procalcitonin: 0.22 ng/mL

## 2020-12-25 LAB — GLUCOSE, CAPILLARY
Glucose-Capillary: 95 mg/dL (ref 70–99)
Glucose-Capillary: 81 mg/dL (ref 70–99)

## 2020-12-25 LAB — AMMONIA
Ammonia: 29 umol/L (ref 9–35)
Ammonia: 33 umol/L (ref 9–35)

## 2020-12-25 LAB — TROPONIN I (HIGH SENSITIVITY): Troponin I (High Sensitivity): 115 ng/L (ref <18)

## 2020-12-25 LAB — PHOSPHORUS: Phosphorus: 3.3 mg/dL (ref 2.5–4.6)

## 2020-12-25 LAB — TRIGLYCERIDES, BODY FLUIDS: Triglycerides, Fluid: 34 mg/dL

## 2020-12-25 LAB — MAGNESIUM: Magnesium: 1.8 mg/dL (ref 1.7–2.4)

## 2020-12-25 MED ORDER — LACTULOSE 10 GM/15ML PO SOLN
20.0000 g | Freq: Two times a day (BID) | ORAL | Status: DC
  Administered 2020-12-25 – 2020-12-29 (×9): 20 g
  Filled 2020-12-25 (×9): qty 30

## 2020-12-25 MED ORDER — VITAL AF 1.2 CAL PO LIQD
1000.0000 mL | ORAL | Status: DC
  Administered 2020-12-25 – 2020-12-29 (×3): 1000 mL

## 2020-12-25 MED ORDER — MAGNESIUM SULFATE 2 GM/50ML IV SOLN
2.0000 g | Freq: Once | INTRAVENOUS | Status: AC
  Administered 2020-12-25: 2 g via INTRAVENOUS
  Filled 2020-12-25: qty 50

## 2020-12-25 MED ORDER — VANCOMYCIN VARIABLE DOSE PER UNSTABLE RENAL FUNCTION (PHARMACIST DOSING): Status: DC

## 2020-12-25 MED ORDER — PROSOURCE TF PO LIQD
45.0000 mL | Freq: Three times a day (TID) | ORAL | Status: DC
  Administered 2020-12-26 – 2020-12-29 (×10): 45 mL
  Filled 2020-12-25 (×12): qty 45

## 2020-12-25 MED ORDER — SODIUM CHLORIDE 0.9 % IV SOLN
2.0000 g | INTRAVENOUS | Status: DC
  Administered 2020-12-25 – 2020-12-27 (×3): 2 g via INTRAVENOUS
  Filled 2020-12-25 (×4): qty 2

## 2020-12-25 MED ORDER — VANCOMYCIN HCL 2000 MG/400ML IV SOLN
2000.0000 mg | Freq: Once | INTRAVENOUS | Status: AC
  Administered 2020-12-25: 2000 mg via INTRAVENOUS
  Filled 2020-12-25: qty 400

## 2020-12-25 NOTE — Progress Notes (Signed)
Initial Nutrition Assessment  DOCUMENTATION CODES:   Obesity unspecified  INTERVENTION:   Initiate Vital 1.2 @50ml /hr + ProSource TF 41ml TID via tube   Free water flushes 58ml q4 hours to maintain tube patency   Regimen provides 1560kcal/day, 123g/day protein and 1143ml/day free water   Pt at high refeed risk; recommend monitor potassium, magnesium and phosphorus labs daily until stable  NUTRITION DIAGNOSIS:   Inadequate oral intake related to inability to eat (pt sedated and ventilated) as evidenced by NPO status.  GOAL:   Provide needs based on ASPEN/SCCM guidelines  MONITOR:   Vent status,Labs,Weight trends,TF tolerance,Skin,I & O's  REASON FOR ASSESSMENT:   Malnutrition Screening Tool,Ventilator    ASSESSMENT:   80 y/o female with hx of A. fib no longer on Eliquis, COPD on 2 L home O2, diastolic CHF on Lasix, polycythemia, HTN, DM, cirrhosis and stage III CKD who is admitted with encephalopathy, sepsis and AKI   Pt s/p paracentesis with 2.0L output 2/10  Pt sedated and ventilated. OGT in place. Plan is to start tube feeds today. Plan is also for HD. Per chart, pt appears weight stable pta.   Medications reviewed and include: colace, heparin, lactulose, MVI, miralax, pepcid, fentanyl  Labs reviewed: K 4.9 wnl, BUN 42(H), creat 1.87(H), P 3.3 wnl, Mg 1.8 wnl Ammonia 29 Wbc- 17.6(H), Hgb 9.8(L), Hct 32.4(L) cbgs- 95, 81 x 24 hrs AIC 5.7(H)- 09/28/20  Patient is currently intubated on ventilator support MV: 7.9 L/min Temp (24hrs), Avg:98.1 F (36.7 C), Min:97.5 F (36.4 C), Max:98.4 F (36.9 C)  Propofol: none   MAP- >28mmHg  UOP- 654ml   NUTRITION - FOCUSED PHYSICAL EXAM:  Flowsheet Row Most Recent Value  Orbital Region No depletion  Upper Arm Region No depletion  Thoracic and Lumbar Region No depletion  Buccal Region No depletion  Temple Region Mild depletion  Clavicle Bone Region No depletion  Clavicle and Acromion Bone Region No depletion   Scapular Bone Region No depletion  Dorsal Hand No depletion  Patellar Region No depletion  Anterior Thigh Region No depletion  Posterior Calf Region No depletion  Edema (RD Assessment) Moderate  Hair Reviewed  Eyes Reviewed  Mouth Reviewed  Skin Reviewed  Nails Reviewed     Diet Order:   Diet Order            Diet NPO time specified  Diet effective now                EDUCATION NEEDS:   No education needs have been identified at this time  Skin:  Skin Assessment: Reviewed RN Assessment  Last BM:  2/11  Height:   Ht Readings from Last 1 Encounters:  12/20/2020 5\' 7"  (1.702 m)    Weight:   Wt Readings from Last 1 Encounters:  12/25/20 100.6 kg    Ideal Body Weight:  61.36 kg  BMI:  Body mass index is 34.74 kg/m.  Estimated Nutritional Needs:   Kcal:  1107-1408kcal/day  Protein:  >125g/day  Fluid:  1.5-1.8L/day  Koleen Distance MS, RD, LDN Please refer to [facility] for RD and/or RD on-call/weekend/after hours pager

## 2020-12-25 NOTE — Consult Note (Signed)
Consultation Note Date: 12/25/2020   Patient Name: Angelica Juarez  DOB: 1941/09/21  MRN: [id number]  Age / Sex: 80 y.o., female  PCP: Sofie Hartigan, MD Referring Physician: Flora Lipps, MD  Reason for Consultation: Establishing goals of care  HPI/Patient Profile:   Clinical Assessment and Goals of Care: Patients is resting in bed on ventilator. No family at bedside. Called to speak with person listed in demographics as her HPOA, her significant other [employer] He states the patient has a child that lives in [place]. He states 1 child they do not know about, and 1 child died about 6 months ago.   He states over the past 6 months, she has spend more and more time sitting on the couch. He states since her previous hospitalization in December, she does not get off the couch to go to the bathroom, and urinates and has BMs on the couch. He states she is able to feed herself. He states she is increasingly forgetful, and most recently wrote several checks but forgot to mail them.   He states they went to an attorney together to complete HPOA and living will papers. He states he is her HPOA. He states he is not sure where the papers are and will have to find them. He adds that he is not prepared to have a [place] conversation as he is not sure of what is on the living will. Discussed the importance of the HPOA papers as patient has children who are her NOK.  He does state she would not want to be placed on life support, and states she would not want the care she is currently receiving as she is on the ventilator at this time.       SUMMARY OF RECOMMENDATIONS   Per significant other, patient would not want to be on life support. He advises he is patient's HPOA. Patient does have children, 1 of which she has contact with. Significant other plans to bring the HPOA documents in tomorrow. These will need to be  scanned into [place].   Prognosis:   Poor       Primary Diagnoses: Present on Admission: . Severe sepsis ([place]) . Acute on chronic diastolic CHF (congestive heart failure) ([place]) . Acute respiratory failure with hypoxia and hypercapnia (HCC) . (Resolved) C. difficile diarrhea . Cirrhosis of liver with ascites ([place]) . Hypertension . Hyperlipidemia, unspecified . AKI (acute kidney injury) ([place]) . Anasarca . Acute metabolic encephalopathy . Hyperammonemia ([place]) . Atypical pneumonia . COPD (chronic obstructive pulmonary disease) ([place]) . Acute on chronic respiratory failure with hypoxia and hypercapnia (HCC) . Chronic kidney disease, stage 3a . Type 2 diabetes mellitus with microalbuminuria, without long-term current use of insulin ([place]) . Other persistent atrial fibrillation ([place]) . Chronic diarrhea of unknown origin   I have reviewed the medical record, interviewed the patient and family, and examined the patient. The following aspects are pertinent.  Past Medical History:  Diagnosis Date  . Allergy    Seasonal  . Arthritis   .  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 Chronic kidney disease    chronic renal insufficiency  . Colon polyps 05/21/14  . COPD (chronic obstructive pulmonary disease) ([place])   . Diabetes mellitus without complication ([place])   . Dyspnea   . Fracture closed, humerus, shaft    right   . History of kidney stones    40 years ago  . Hyperlipidemia 04/30/14  . Hypertension   . Impingement syndrome of right shoulder 12/29/15  . Personal history of radiation therapy   . Pneumonia    hx  . Rotator cuff tear 04/21/16   right  . Seizures ([place])   . Sleep apnea    Social History   Socioeconomic History  . Marital status: Significant Other    Spouse name: Collins Scotland  . Number of children: 3  . Years of education: Not on file  . Highest education level: Not on file  Occupational History  . Not on file  Tobacco Use  . Smoking status: Former Smoker     Packs/day: 1.00    Years: 40.00    Pack years: 40.00    Quit date: 05/12/2005    Years since quitting: 15.6  . Smokeless tobacco: Never Used  Vaping Use  . Vaping Use: Never used  Substance and Sexual Activity  . Alcohol use: No  . Drug use: No  . Sexual activity: Not Currently  Other Topics Concern  . Not on file  Social History Narrative   Per Collins Scotland one of [name]'s daughters passed away a few months ago.   Social Determinants of Health   Financial Resource Strain: Not on file  Food Insecurity: Not on file  Transportation Needs: Not on file  Physical Activity: Not on file  Stress: Not on file  Social Connections: Not on file   Family History  Problem Relation Age of Onset  . Breast cancer Paternal Aunt 20  . Diabetes Father   . Heart disease Father   . Breast cancer Daughter 74   Scheduled Meds: . atorvastatin  40 mg Per Tube Daily  . chlorhexidine gluconate (MEDLINE KIT)  15 mL Mouth Rinse BID  . Chlorhexidine Gluconate Cloth  6 each Topical Daily  . docusate  100 mg Per Tube BID  . etomidate  20 mg Intravenous Once  . [START ON 12/26/2020] feeding supplement (PROSource TF)  45 mL Per Tube TID  . fluticasone  2 spray Each Nare Daily  . heparin  5,000 Units Subcutaneous Q8H  . lactulose  20 g Per Tube BID  . linagliptin  5 mg Per Tube Daily  . mouth rinse  15 mL Mouth Rinse 10 times per day  . montelukast  10 mg Per Tube QHS  . multivitamin  15 mL Per Tube Daily  . nystatin cream   Topical BID  . polyethylene glycol  17 g Per Tube Daily  . umeclidinium-vilanterol  1 puff Inhalation Daily   Continuous Infusions: . albumin human    . ceFEPime (MAXIPIME) IV 2 g (12/25/20 1400)  . famotidine (PEPCID) IV 20 mg (12/24/20 1930)  . feeding supplement (VITAL AF 1.2 CAL) 1,000 mL (12/25/20 1354)  . fentaNYL infusion INTRAVENOUS Stopped (12/25/20 1310)  . levETIRAcetam Stopped (12/25/20 0049)  . norepinephrine (LEVOPHED) Adult infusion Stopped (12/24/20 1929)   PRN  Meds:.acetaminophen **OR** acetaminophen, albumin human, fentaNYL, ipratropium-albuterol, metoprolol tartrate, midazolam, ondansetron **OR** ondansetron (ZOFRAN) IV Medications Prior to Admission:  Prior to Admission medications   Medication Sig Start  Date End Date Taking? Authorizing Provider  albuterol (VENTOLIN HFA) 108 (90 Base) MCG/ACT inhaler Inhale 2 puffs into the lungs every 6 (six) hours as needed for wheezing or shortness of breath.    [provider]  apixaban (ELIQUIS) 5 MG TABS tablet Take 5 mg by mouth 2 (two) times daily.    [provider]  atorvastatin (LIPITOR) 40 MG tablet Take 40 mg by mouth daily. 04/04/16   [provider]  fluticasone (FLONASE) 50 MCG/ACT nasal spray Place 2 sprays into both nostrils daily. 08/08/19   Kendell Bane, NP  furosemide (LASIX) 40 MG tablet Take 1 tablet (40 mg total) by mouth daily. 09/14/20   Lin Landsman, MD  hydroxyurea (HYDREA) 500 MG capsule TAKE ONE CAPSULE BY MOUTH TWICE DAILY ON MONDAYS ONLY, 1 CAPSULE DAILY ON TUESDAY TO SUNDAY. MAY TAKE WITH FOOD TO MINIMIZE GI SIDE EFFECTS 10/15/20   Lequita Asal, MD  metFORMIN (GLUCOPHAGE) 500 MG tablet Take 500 mg by mouth 2 (two) times daily with a meal. 12/25/15   [provider]  montelukast (SINGULAIR) 10 MG tablet TAKE 1 TABLET BY MOUTH EVERY DAY Patient taking differently: Take 10 mg by mouth at bedtime. 07/02/15   Kathrine Haddock, NP  NIFEdipine (ADALAT CC) 30 MG 24 hr tablet Take 30 mg by mouth daily.    [provider]  OXYGEN Inhale 2 L into the lungs.    [provider]  potassium chloride SA (KLOR-CON) 20 MEQ tablet Take 20 mEq by mouth daily. 08/23/20   [provider]  sitaGLIPtin (JANUVIA) 25 MG tablet Take 1 tablet by mouth daily. 01/01/18   [provider]  traZODone (DESYREL) 100 MG tablet Take by mouth. 07/22/20   [provider]  umeclidinium-vilanterol (ANORO ELLIPTA) 62.5-25 MCG/INH AEPB  Inhale 1 puff into the lungs daily. 02/06/20   Kendell Bane, NP  valsartan-hydrochlorothiazide (DIOVAN-HCT) 320-25 MG tablet Take 1 tablet by mouth daily. 04/14/16   [provider]   Allergies  Allergen Reactions  . Iodinated Diagnostic Agents Anaphylaxis    Other reaction(s): Other (See Comments) Throat swells and extreme hives  . Lamictal [Lamotrigine] Rash    Suspected drug rash 09/2020  . Latex Itching  . Phenobarbital Hives  . Tape Rash    silicones   Review of Systems  Unable to perform ROS   Physical Exam Constitutional:      Comments: Eyes closed. On ventilator.      Vital Signs: BP (!) 99/50   Pulse 96   Temp 98.1 F (36.7 C) (Axillary)   Resp 18   Ht $R'5\' 7"'Rq$  (1.702 m)   Wt 100.6 kg   SpO2 93%   BMI 34.74 kg/m  Pain Scale: CPOT   Pain Score: Asleep   SpO2: SpO2: 93 % O2 Device:SpO2: 93 % O2 Flow Rate: .O2 Flow Rate (L/min): 15 L/min  IO: Intake/output summary:   Intake/Output Summary (Last 24 hours) at 12/25/2020 1520 Last data filed at 12/25/2020 0400 Gross per 24 hour  Intake 982.66 ml  Output 605 ml  Net 377.66 ml    LBM: Last BM Date: 12/25/20 Baseline Weight: Weight: 104.5 kg Most recent weight: Weight: 100.6 kg        Time In: 3:00 Time Out: 3:30 Time Total: 30 min Greater than 50%  of this time was spent counseling and coordinating care related to the above assessment and plan.  Signed by: Asencion Gowda, NP   Please contact Palliative Medicine Team  phone at [phone] for questions and concerns.  For individual provider: See Shea Evans

## 2020-12-25 NOTE — Progress Notes (Signed)
Pharmacy Antibiotic Note  Angelica Juarez is a 80 y.o. female admitted on 01/03/2021 with respiratory failure secondary to pneumonia, multiorgan failure requiring intubation. Blood cultures with GPC in 1 bottle and GPR in 1 bottle, likely contaminants. On ceftriaxone and azithromycin which were discontinued. Pharmacy has been consulted for vancomycin and cefepime dosing.  Plan: Vancomycin 2000 mg IV x 1. Nephrology following for possible HD needs. Based on current renal function, expect 48 hr dosing of vanc. Will follow up renal function with morning labs and Nephrology's plan.  Cefepime 2 g IV q24h based on current renal function.  Height: 5\' 7"  (170.2 cm) Weight: 100.6 kg (221 lb 12.5 oz) IBW/kg (Calculated) : 61.6  Temp (24hrs), Avg:98.1 F (36.7 C), Min:97.5 F (36.4 C), Max:98.6 F (37 C)  Recent Labs  Lab 12/28/2020 1604 12/24/20 0626 12/24/20 1620 12/25/20 0351  WBC 23.1* 21.8*  --  17.6*  CREATININE 2.12* 1.84* 2.45* 1.87*  LATICACIDVEN 1.8  --   --   --     Estimated Creatinine Clearance: 30.7 mL/min (A) (by C-G formula based on SCr of 1.87 mg/dL (H)).    Allergies  Allergen Reactions  . Iodinated Diagnostic Agents Anaphylaxis    Other reaction(s): Other (See Comments) Throat swells and extreme hives  . Lamictal [Lamotrigine] Rash    Suspected drug rash 09/2020  . Latex Itching  . Phenobarbital Hives  . Tape Rash    silicones    Antimicrobials this admission: Ceftriaxone 2/9 >> 2/11 Azithromycin 2/9 >> 2/11 Vancomycin 2/11 >> Cefepime 2/11 >>   Microbiology results: 2/9 BCx: 1/4 bottles GPC, 1/4 bottles GPR 2/10 Peritoneal fluid: pending 2/10 MRSA PCR: negative  Thank you for allowing pharmacy to be a part of this patient's care.  Tawnya Crook, PharmD 12/25/2020 3:25 PM

## 2020-12-25 NOTE — Progress Notes (Signed)
NAME:  Angelica Juarez, MRN:  [id number], DOB:  12-28-1940, LOS: 2 ADMISSION DATE:  12/19/2020, CONSULTATION DATE:  12/24/2020 REFERRING MD:  Arbutus Ped, CHIEF COMPLAINT: Respiratory failure  Brief History:  80 year old with polycythemia vera, CKD stage III, COPD on 2 L/min home oxygen, A. fib not on anticoagulation due to history of retroperitoneal bleed, HFpEF, hypertension, cirrhosis  Admitted with acute respiratory failure secondary to pneumonia, multiorgan failure with AKI, liver failure, hepatorenal syndrome  Past Medical History:    has a past medical history of Allergy, Arthritis, Cancer ([place]), Chronic kidney disease, Colon polyps (05/21/14), COPD (chronic obstructive pulmonary disease) ([place]), Diabetes mellitus without complication ([place]), Dyspnea, Fracture closed, humerus, shaft, History of kidney stones, Hyperlipidemia (04/30/14), Hypertension, Impingement syndrome of right shoulder (12/29/15), Personal history of radiation therapy, Pneumonia, Rotator cuff tear (04/21/16), Seizures ([place]), and Sleep apnea.  Significant Hospital Events:   2/9 Admitted for Pneumonia 2/10 US paracentesis 2L yellow fluid removed 2/10 emergently intubated after she failed BiPAP, HD catheter placed and dialyzed  Consults:  Nephrology  Procedures:    Significant Diagnostic Tests:  CT head 2/9-no acute findings.  Echocardiogram 2/10-LVEF 60-65%, normal RV systolic function, mild enlargement of RV size  Micro Data:  Blood culture 2/9-GPC, GPR  Antimicrobials:  Ceftriaxone 2/9-2/11 Azithromycin 2/9-2/11  Vanco 2/11 >>  Cefepime 2/11 >>   Interim History / Subjective:   Remains on the ventilator, mental status is poor  Objective   Blood pressure (!) 99/50, pulse 98, temperature 98.4 F (36.9 C), temperature source Axillary, resp. rate 16, height 5\' 7"  (1.702 m), weight 100.6 kg, SpO2 96 %.    Vent Mode: PRVC FiO2 (%):  [40 %] 40 % Set Rate:  [16 bmp] 16 bmp Vt Set:  [500 mL] 500 mL PEEP:  [5  cmH20] 5 cmH20 Plateau Pressure:  [14 cmH20-18 cmH20] 18 cmH20   Intake/Output Summary (Last 24 hours) at 12/25/2020 1142 Last data filed at 12/25/2020 0400 Gross per 24 hour  Intake 982.66 ml  Output 605 ml  Net 377.66 ml   Filed Weights   12/19/2020 1556 12/24/20 1700 12/25/20 0309  Weight: 104.5 kg 102.6 kg 100.6 kg    Examination: Blood pressure (!) 99/50, pulse 98, temperature 98.4 F (36.9 C), temperature source Axillary, resp. rate 16, height 5\' 7"  (1.702 m), weight 100.6 kg, SpO2 96 %. Gen:      No acute distress, chronically ill appearing HEENT:  EOMI, sclera anicteric Neck:     No masses; no thyromegaly ETT Lungs:    Clear to auscultation bilaterally; normal respiratory effort CV:         Regular rate and rhythm; no murmurs Abd:      + bowel sounds; soft, non-tender; no palpable masses, no distension Ext:    No edema; adequate peripheral perfusion Skin:      Warm and dry; no rash Neuro: Sedated, unresponsive  Lab/imaging reviewed Significant for BUN/creatinine 42/1.87. WBC 17.6, platelets 402 No new imaging  Resolved Hospital Problem list     Assessment & Plan:  80 yo morbidly obese white female with acute decompensation of liver cirrhosis with progressive renal failure leading to severe hypoxic resp failure with pulm edema  Acute hypoxic, hypercarbic respiratory failure Has baseline COPD Continue vent support We will start pressure support weans when mental status improves Wean sedation Continue anoro, nebs PRN  Acute kidney injury, hepatorenal syndrome versus ATN Nephrology is following.  Underwent emergent hemodialysis last night. Follow urine output and creatinine  Sepsis Blood cultures  with GPC's.  We will continue antibiotics for now.  Follow final blood cultures Follow peritoneal cultures  Cirrhosis, portal hypertension with ascites Status post paracentesis Lactulose.  Change rectal enemas to per tube and she has an OG tube  Acute on chronic  diastolic heart failure Atrial fibrillation Volume removal with dialysis.  Not on anticoagulation due to recent admission with blood loss anemia secondary to hematoma Not on any rate control medications at home  Polycythemia vera On hydroxyurea as an outpatient. Will need to hold that as we cannot give it per tube  Seizure disorder Continue Keppra  Best practice (evaluated daily)  Diet: NPO Pain/Anxiety/Delirium protocol (if indicated): Versed VAP protocol (if indicated): Ordered DVT prophylaxis: Hep SQ GI prophylaxis: Pepcid Glucose control: Monitor Mobility: Bed  Disposition: Full  Goals of Care:  Last date of multidisciplinary goals of care discussion: 2/11 Family and staff present: Discussed with Collins Scotland POA significant other and daughter Beverlee Nims  Summary of discussion: Collins Scotland is the power of attorney but he has dementia, forgetfulness and needs help to make decisions.  Daughter Beverlee Nims who lives in [place] wants to help and will try to come in to be at the bedside Beverlee Nims is going to review her mom's living will as there are some confusion as to the [place].  Marlissa was DNR on previous admission.  I would also involve palliative care to help with these discussions  Follow up goals of care discussion due: 2/19 Code Status: Full  Critical care time:    The patient is critically ill with multiple organ system failure and requires high complexity decision making for assessment and support, frequent evaluation and titration of therapies, advanced monitoring, review of radiographic studies and interpretation of complex data.   Critical Care Time devoted to patient care services, exclusive of separately billable procedures, described in this note is 45 minutes.   Marshell Garfinkel MD [facility] See [place]ion for pager  If no response to pager , please call [phone] until 7pm After 7:00 pm call Elink  [phone] 12/25/2020, 12:31 PM

## 2020-12-25 NOTE — Progress Notes (Signed)
[facility]  ROUNDING NOTE   Subjective:  Patient well-known to [place] from the office know we last saw her in February 2021. Presented to the hospital on 12/22/2020 with altered mental status. Was not taking her medications at home. Now found to have acute kidney injury with a creatinine of 2.2 upon admission.  Also noted to be hypercalcemic with calcium of 10.6. Currently on the ventilator. Also had hyperkalemia yesterday and therefore underwent hemodialysis. 02/10 0701 - 02/11 0700 In: 982.7 [I.V.:110; IV Piggyback:872.7] Out: 605 [Urine:605] Lab Results  Component Value Date   CREATININE 1.87 (H) 12/25/2020   CREATININE 2.45 (H) 12/24/2020   CREATININE 1.84 (H) 12/24/2020     Objective:  Vital signs in last 24 hours:  Temp:  [97.5 F (36.4 C)-98.6 F (37 C)] 98.1 F (36.7 C) (02/11 1200) Pulse Rate:  [43-115] 86 (02/11 1400) Resp:  [16-31] 16 (02/11 1400) BP: (94-156)/(31-93) 109/65 (02/11 1400) SpO2:  [46 %-100 %] 94 % (02/11 1400) FiO2 (%):  [35 %-40 %] 35 % (02/11 1319) Weight:  [100.6 kg-102.6 kg] 100.6 kg (02/11 0309)  Weight change: -1.9 kg Filed Weights   12/19/2020 1556 12/24/20 1700 12/25/20 0309  Weight: 104.5 kg 102.6 kg 100.6 kg    Intake/Output: I/O last 3 completed shifts: In: 1082.7 [I.V.:110; IV Piggyback:972.7] Out: 605 [Urine:605]   Intake/Output this shift:  No intake/output data recorded.  Physical Exam: General:  Critically ill-appearing  Head:  Normocephalic, atraumatic.  Endotracheal tube in place  Eyes:  Anicteric  Neck:  Supple  Lungs:   Scattered rhonchi, vent assisted  Heart:  S1S2 no rubs  Abdomen:   Soft, nontender, bowel sounds present  Extremities:  1+ peripheral edema.  Neurologic:  Intubated/sedated  Skin:  Warm/dry  Access:  Left IJ temporary dialysis catheter    Basic Metabolic Panel: Recent Labs  Lab 01/07/2021 1604 12/24/20 0626 12/24/20 1620 12/25/20 0351  NA 140 141 140 141  K 5.5* 5.0 6.3* 4.9  CL  101 110 101 104  CO2 25 19* 24 28  GLUCOSE 123* 118* 134* 102*  BUN 42* 42* 54* 42*  CREATININE 2.12* 1.84* 2.45* 1.87*  CALCIUM 10.6* 8.3* 10.2 9.0  MG 2.2  --   --  1.8  PHOS  --   --   --  3.3    Liver Function Tests: Recent Labs  Lab 12/26/2020 1604 12/25/20 0351  AST 33 28  ALT 15 13  ALKPHOS 88 62  BILITOT 1.1 0.8  PROT 6.8 5.5*  ALBUMIN 3.8 3.1*   No results for input(s): LIPASE, AMYLASE in the last 168 hours. Recent Labs  Lab 12/24/20 1201 12/24/20 2355 12/25/20 0351  AMMONIA 58* 33 29    CBC: Recent Labs  Lab 01/01/2021 1604 12/24/20 0626 12/25/20 0351  WBC 23.1* 21.8* 17.6*  NEUTROABS 20.7*  --  15.9*  HGB 11.6* 11.0* 9.8*  HCT 39.4 37.8 32.4*  MCV 92.9 93.6 90.5  PLT 605* 563* 402*    Cardiac Enzymes: No results for input(s): CKTOTAL, CKMB, CKMBINDEX, TROPONINI in the last 168 hours.  BNP: Invalid input(s): POCBNP  CBG: Recent Labs  Lab 12/24/20 1713 12/24/20 1930 12/24/20 2352 12/25/20 0334 12/25/20 0728  GLUCAP 121* 122* 102* 95 81    Microbiology: Results for orders placed or performed during the hospital encounter of 12/20/2020  SARS CORONAVIRUS 2 (TAT 6-24 HRS) Nasopharyngeal Nasopharyngeal Swab     Status: None   Collection Time: 01/06/2021  4:04 PM   Specimen: Nasopharyngeal  Swab  Result Value Ref Range Status   SARS Coronavirus 2 NEGATIVE NEGATIVE Final    Comment: (NOTE) SARS-CoV-2 target nucleic acids are NOT DETECTED.  The SARS-CoV-2 RNA is generally detectable in upper and lower respiratory specimens during the acute phase of infection. Negative results do not preclude SARS-CoV-2 infection, do not rule out co-infections with other pathogens, and should not be used as the sole basis for treatment or other patient management decisions. Negative results must be combined with clinical observations, patient history, and epidemiological information. The expected result is Negative.  Fact Sheet for  Patients: SugarRoll.be  Fact Sheet for Healthcare Providers: https://www.woods-mathews.com/  This test is not yet approved or cleared by the [place] FDA and  has been authorized for detection and/or diagnosis of SARS-CoV-2 by FDA under an Emergency Use Authorization (EUA). This EUA will remain  in effect (meaning this test can be used) for the duration of the COVID-19 declaration under Se ction 564(b)(1) of the Act, 21 U.S.C. section 360bbb-3(b)(1), unless the authorization is terminated or revoked sooner.  Performed at [facility], [place] [address], [place], [place] [address]   Culture, blood (single)     Status: None (Preliminary result)   Collection Time: 12/20/2020  4:58 PM   Specimen: BLOOD  Result Value Ref Range Status   Specimen Description BLOOD RIGHT ANTECUBITAL  Final   Special Requests   Final    BOTTLES DRAWN AEROBIC AND ANAEROBIC Blood Culture adequate volume   Culture  Setup Time   Final    Organism ID to follow GRAM POSITIVE COCCI AEROBIC BOTTLE ONLY CRITICAL RESULT CALLED TO, READ BACK BY AND VERIFIED WITH: SUSAN WATSON PHARMD AT 8768 ON 12/24/20 SNG ANAEROBIC BOTTLE ONLY GRAM POSITIVE RODS CRITICAL RESULT CALLED TO, READ BACK BY AND VERIFIED WITH: NATHAN BELUE @2236  ON 12/24/20 SKL Performed at [facility], [address] [place], [place] [address]    Culture GRAM POSITIVE COCCI  Final   Report Status PENDING  Incomplete  Blood Culture ID Panel (Reflexed)     Status: None   Collection Time: 01/01/2021  4:58 PM  Result Value Ref Range Status   Enterococcus faecalis NOT DETECTED NOT DETECTED Final   Enterococcus Faecium NOT DETECTED NOT DETECTED Final   Listeria monocytogenes NOT DETECTED NOT DETECTED Final   Staphylococcus species NOT DETECTED NOT DETECTED Final   Staphylococcus aureus (BCID) NOT DETECTED NOT DETECTED Final   Staphylococcus epidermidis NOT DETECTED NOT DETECTED Final    Staphylococcus lugdunensis NOT DETECTED NOT DETECTED Final   Streptococcus species NOT DETECTED NOT DETECTED Final   Streptococcus agalactiae NOT DETECTED NOT DETECTED Final   Streptococcus pneumoniae NOT DETECTED NOT DETECTED Final   Streptococcus pyogenes NOT DETECTED NOT DETECTED Final   A.calcoaceticus-baumannii NOT DETECTED NOT DETECTED Final   Bacteroides fragilis NOT DETECTED NOT DETECTED Final   Enterobacterales NOT DETECTED NOT DETECTED Final   Enterobacter cloacae complex NOT DETECTED NOT DETECTED Final   Escherichia coli NOT DETECTED NOT DETECTED Final   Klebsiella aerogenes NOT DETECTED NOT DETECTED Final   Klebsiella oxytoca NOT DETECTED NOT DETECTED Final   Klebsiella pneumoniae NOT DETECTED NOT DETECTED Final   Proteus species NOT DETECTED NOT DETECTED Final   Salmonella species NOT DETECTED NOT DETECTED Final   Serratia marcescens NOT DETECTED NOT DETECTED Final   Haemophilus influenzae NOT DETECTED NOT DETECTED Final   Neisseria meningitidis NOT DETECTED NOT DETECTED Final   Pseudomonas aeruginosa NOT DETECTED NOT DETECTED Final   Stenotrophomonas maltophilia  NOT DETECTED NOT DETECTED Final   Candida albicans NOT DETECTED NOT DETECTED Final   Candida auris NOT DETECTED NOT DETECTED Final   Candida glabrata NOT DETECTED NOT DETECTED Final   Candida krusei NOT DETECTED NOT DETECTED Final   Candida parapsilosis NOT DETECTED NOT DETECTED Final   Candida tropicalis NOT DETECTED NOT DETECTED Final   Cryptococcus neoformans/gattii NOT DETECTED NOT DETECTED Final    Comment: Performed at [facility], [address] [place], [place] [address]  Body fluid culture     Status: None (Preliminary result)   Collection Time: 12/24/20  2:37 PM   Specimen: PATH Cytology Peritoneal fluid  Result Value Ref Range Status   Specimen Description   Final    PERITONEAL Performed at [facility], [address], [place], [place] [address]    Special Requests   Final     NONE Performed at [facility], [address] [place], [place] [address]    Gram Stain   Final    RARE WBC PRESENT,BOTH PMN AND MONONUCLEAR NO ORGANISMS SEEN Performed at [facility], [place] [address] [place], [place] [address]    Culture PENDING  Incomplete   Report Status PENDING  Incomplete  Aerobic/Anaerobic Culture (surgical/deep wound)     Status: None (Preliminary result)   Collection Time: 12/24/20  2:37 PM   Specimen: PATH Cytology Peritoneal fluid  Result Value Ref Range Status   Specimen Description   Final    PERITONEAL Performed at [facility], [address] [place], [place] [address]    Special Requests   Final    NONE Performed at [facility], [address] [place], [place] [address]    Gram Stain   Final    FEW WBC PRESENT, PREDOMINANTLY MONONUCLEAR NO ORGANISMS SEEN Performed at [facility], [place] [address] [place], [place] [address]    Culture PENDING  Incomplete   Report Status PENDING  Incomplete  MRSA PCR Screening     Status: None   Collection Time: 12/24/20 11:22 PM   Specimen: Nasopharyngeal  Result Value Ref Range Status   MRSA by PCR NEGATIVE NEGATIVE Final    Comment:        The GeneXpert MRSA Assay (FDA approved for NASAL specimens only), is one component of a comprehensive MRSA colonization surveillance program. It is not intended to diagnose MRSA infection nor to guide or monitor treatment for MRSA infections. Performed at [facility], [address] [place], [place] [address]     Coagulation Studies: No results for input(s): LABPROT, INR in the last 72 hours.  Urinalysis: Recent Labs    12/26/2020 2143  COLORURINE AMBER*  LABSPEC 1.016  PHURINE 5.0  GLUCOSEU NEGATIVE  HGBUR NEGATIVE  BILIRUBINUR NEGATIVE  KETONESUR 5*  PROTEINUR 100*  NITRITE NEGATIVE  LEUKOCYTESUR TRACE*      Imaging: DG Abd 1 View  Result Date: 12/24/2020 CLINICAL DATA:  80 year old  female with NG placement. EXAM: ABDOMEN - 1 VIEW COMPARISON:  CT abdomen pelvis dated 01/16/2020. FINDINGS: Enteric tube with tip and side-port in the left upper abdomen, likely in the proximal stomach. No bowel dilatation. Multiple surgical clips noted over the abdomen. There is cardiomegaly. Bibasilar densities, likely combination of pleural effusion and associated atelectasis or infiltrate. There is atherosclerotic calcification of the aorta. No acute osseous pathology. IMPRESSION: Enteric tube with tip in the proximal stomach. Electronically Signed   By: Anner Crete M.D.   On: 12/24/2020  22:06   CT Head Wo Contrast  Result Date: 12/20/2020 CLINICAL DATA:  Mental status change, unknown cause; altered, on Eliquis, evaluate ICH. EXAM: CT HEAD WITHOUT CONTRAST TECHNIQUE: Contiguous axial images were obtained from the base of the skull through the vertex without intravenous contrast. COMPARISON:  No pertinent prior exams available for comparison. FINDINGS: Brain: Mild cerebral and cerebellar atrophy. There is no acute intracranial hemorrhage. No demarcated cortical infarct. No extra-axial fluid collection. No evidence of intracranial mass. No midline shift. Vascular: No hyperdense vessel.  Atherosclerotic calcifications. Skull: Normal. Negative for fracture or focal lesion. Sinuses/Orbits: Visualized orbits show no acute finding. Small calcification along the posterior aspect of the left globe. No significant paranasal sinus disease at the imaged levels. IMPRESSION: No evidence of acute intracranial abnormality. Mild generalized atrophy of the brain. 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 Electronically Signed   By: Kellie Simmering DO   On: 01/06/2021 16:43   US Paracentesis  Result Date: 12/24/2020 INDICATION: Abdominal distension.  Evaluate for ascites and paracentesis. EXAM: ULTRASOUND GUIDED  PARACENTESIS MEDICATIONS: None. COMPLICATIONS: None  immediate. PROCEDURE: Patient has altered mental status and telephone consent was obtained from the patient's significant other. Initial ultrasound scanning demonstrates a moderate amount of ascites identified in the left abdomen. The left abdomen was prepped and draped in the usual sterile fashion. 1% lidocaine was used for local anesthesia. Following this, a 6 Fr Safe-T-Centesis catheter was introduced. An ultrasound image was saved for documentation purposes. The paracentesis was performed. The catheter was removed and a dressing was applied. The patient tolerated the procedure well without immediate post procedural complication. Patient received post-procedure intravenous albumin; see nursing notes for details. FINDINGS: A total of approximately 2 L of dark yellow fluid was removed. Samples were sent to the laboratory as requested by the clinical team. IMPRESSION: Successful ultrasound-guided paracentesis yielding 2 liters of peritoneal fluid. Electronically Signed   By: Markus Daft M.D.   On: 12/24/2020 15:27   Portable Chest x-ray  Result Date: 12/24/2020 CLINICAL DATA:  Confirm endotracheal tube and central line placement. EXAM: PORTABLE CHEST 1 VIEW COMPARISON:  Chest radiograph December 23, 2020 FINDINGS: Endotracheal tube with tip overlying the midthoracic trachea. Nasogastric tube coursing below the diaphragm with tip obscured by collimation. Left IJ central venous catheter with tip overlying the SVC. Similar cardiomegaly. Aortic atherosclerosis. Increased diffuse interstitial opacities. Similar small bilateral pleural effusions. No pneumothorax the visualized skeletal structures are unchanged. IMPRESSION: 1. Endotracheal tube with tip overlying the midthoracic trachea. 2. New left IJ central venous catheter with tip overlying the SVC. No pneumothorax. 3. Increased diffuse interstitial opacities suggesting pulmonary edema. Similar small bilateral pleural effusions. Electronically Signed   By: Dahlia Bailiff MD   On: 12/24/2020 19:03   DG Chest Portable 1 View  Result Date: 01/11/2021 CLINICAL DATA:  COPD/CHF exacerbation EXAM: PORTABLE CHEST 1 VIEW COMPARISON:  09/28/2020 FINDINGS: Background emphysema. Interstitial prominence with patchy increased density bilaterally. Small bilateral pleural effusions with bibasilar atelectasis/consolidation. Stable cardiomegaly. No pneumothorax. IMPRESSION: Emphysema likely with superimposed edema or atypical pneumonia. Small bilateral pleural effusions. Electronically Signed   By: Macy Mis M.D.   On: 01/02/2021 16:18   ECHOCARDIOGRAM COMPLETE  Result Date: 12/24/2020    ECHOCARDIOGRAM REPORT   Patient Name:   Angelica Juarez Date of Exam: 12/24/2020 Medical Rec #:  [id number]   Height:       67.0 in Accession #:    [id number]  Weight:  230.4 lb Date of Birth:  11-13-1941   BSA:          2.148 m Patient Age:    26 years    BP:           137/65 mmHg Patient Gender: F           HR:           115 bpm. Exam Location:  [facility] Procedure: 2D Echo, Color Doppler and Cardiac Doppler Indications:     I50.9 Congestive Heart Failure  History:         Patient has prior history of Echocardiogram examinations. COPD                  and CKD; Risk Factors:Sleep Apnea, Hypertension, Dyslipidemia                  and Diabetes.  Sonographer:     Charmayne Sheer RDCS (AE) Referring Phys:  [id number] AMY N COX Diagnosing Phys: Bartholome Bill MD  Sonographer Comments: Suboptimal subcostal window. Image acquisition challenging due to COPD. IMPRESSIONS  1. Left ventricular ejection fraction, by estimation, is 60 to 65%. The left ventricle has normal function. The left ventricle has no regional wall motion abnormalities. Left ventricular diastolic parameters were normal.  2. Right ventricular systolic function is normal. The right ventricular size is mildly enlarged.  3. Left atrial size was mildly dilated.  4. Right atrial size was moderately dilated.  5. The mitral valve is grossly normal. Mild to  moderate mitral valve regurgitation.  6. Tricuspid valve regurgitation is moderate.  7. The aortic valve is calcified. Aortic valve regurgitation is trivial. FINDINGS  Left Ventricle: Left ventricular ejection fraction, by estimation, is 60 to 65%. The left ventricle has normal function. The left ventricle has no regional wall motion abnormalities. The left ventricular internal cavity size was normal in size. There is  no left ventricular hypertrophy. Left ventricular diastolic parameters were normal. Right Ventricle: The right ventricular size is mildly enlarged. No increase in right ventricular wall thickness. Right ventricular systolic function is normal. Left Atrium: Left atrial size was mildly dilated. Right Atrium: Right atrial size was moderately dilated. Pericardium: There is no evidence of pericardial effusion. Mitral Valve: The mitral valve is grossly normal. Mild to moderate mitral valve regurgitation. MV peak gradient, 12.2 mmHg. The mean mitral valve gradient is 5.0 mmHg. Tricuspid Valve: The tricuspid valve is not well visualized. Tricuspid valve regurgitation is moderate. Aortic Valve: The aortic valve is calcified. Aortic valve regurgitation is trivial. Aortic valve mean gradient measures 8.0 mmHg. Aortic valve peak gradient measures 12.4 mmHg. Aortic valve area, by VTI measures 2.12 cm. Pulmonic Valve: The pulmonic valve was not well visualized. Pulmonic valve regurgitation is trivial. Aorta: The aortic root is normal in size and structure. IAS/Shunts: No atrial level shunt detected by color flow Doppler.  LEFT VENTRICLE PLAX 2D LVIDd:         4.80 cm  Diastology LVIDs:         3.10 cm  LV e' medial:    6.53 cm/s LV PW:         0.90 cm  LV E/e' medial:  20.2 LV IVS:        1.00 cm  LV e' lateral:   12.80 cm/s LVOT diam:     1.80 cm  LV E/e' lateral: 10.3 LV SV:         58 LV SV Index:   27 LVOT Area:  2.54 cm  RIGHT VENTRICLE RV Basal diam:  3.70 cm RV Mid diam:    4.50 cm TAPSE (M-mode): 1.7  cm LEFT ATRIUM             Index       RIGHT ATRIUM           Index LA diam:        4.90 cm 2.28 cm/m  RA Area:     28.10 cm LA Vol (A2C):   66.1 ml 30.78 ml/m RA Volume:   95.40 ml  44.42 ml/m LA Vol (A4C):   58.0 ml 27.01 ml/m LA Biplane Vol: 64.9 ml 30.22 ml/m  AORTIC VALVE                    PULMONIC VALVE AV Area (Vmax):    2.15 cm     PV Vmax:       1.34 m/s AV Area (Vmean):   1.82 cm     PV Vmean:      81.200 cm/s AV Area (VTI):     2.12 cm     PV VTI:        0.179 m AV Vmax:           176.00 cm/s  PV Peak grad:  7.2 mmHg AV Vmean:          130.000 cm/s PV Mean grad:  3.0 mmHg AV VTI:            0.273 m AV Peak Grad:      12.4 mmHg AV Mean Grad:      8.0 mmHg LVOT Vmax:         149.00 cm/s LVOT Vmean:        93.200 cm/s LVOT VTI:          0.227 m LVOT/AV VTI ratio: 0.83  AORTA Ao Root diam: 2.80 cm MITRAL VALVE                TRICUSPID VALVE MV Area (PHT): 4.09 cm     TR Peak grad:   35.8 mmHg MV Area VTI:   1.93 cm     TR Vmax:        299.00 cm/s MV Peak grad:  12.2 mmHg MV Mean grad:  5.0 mmHg     SHUNTS MV Vmax:       1.75 m/s     Systemic VTI:  0.23 m MV Vmean:      103.0 cm/s   Systemic Diam: 1.80 cm MV Decel Time: 186 msec MV E velocity: 132.00 cm/s Bartholome Bill MD Electronically signed by Bartholome Bill MD Signature Date/Time: 12/24/2020/1:21:54 PM    Final      Medications:   . albumin human    . ceFEPime (MAXIPIME) IV 2 g (12/25/20 1400)  . famotidine (PEPCID) IV 20 mg (12/24/20 1930)  . feeding supplement (VITAL AF 1.2 CAL) 1,000 mL (12/25/20 1354)  . fentaNYL infusion INTRAVENOUS Stopped (12/25/20 1310)  . levETIRAcetam Stopped (12/25/20 0049)  . norepinephrine (LEVOPHED) Adult infusion Stopped (12/24/20 1929)   . atorvastatin  40 mg Per Tube Daily  . chlorhexidine gluconate (MEDLINE KIT)  15 mL Mouth Rinse BID  . Chlorhexidine Gluconate Cloth  6 each Topical Daily  . docusate  100 mg Per Tube BID  . etomidate  20 mg Intravenous Once  . [START ON 12/26/2020] feeding  supplement (PROSource TF)  45 mL Per Tube TID  . fluticasone  2 spray Each Nare  Daily  . heparin  5,000 Units Subcutaneous Q8H  . lactulose  20 g Per Tube BID  . linagliptin  5 mg Per Tube Daily  . mouth rinse  15 mL Mouth Rinse 10 times per day  . montelukast  10 mg Per Tube QHS  . multivitamin  15 mL Per Tube Daily  . nystatin cream   Topical BID  . polyethylene glycol  17 g Per Tube Daily  . umeclidinium-vilanterol  1 puff Inhalation Daily   acetaminophen **OR** acetaminophen, albumin human, fentaNYL, ipratropium-albuterol, metoprolol tartrate, midazolam, ondansetron **OR** ondansetron (ZOFRAN) IV  Assessment/ Plan:  81 y.o. female with past medical history of seizure disorder, depression, diabetes mellitus type 2, hypertension, hyperlipidemia, COPD, obstructive sleep apnea, cancer of the left fallopian tube, nephrolithiasis, prior history of pneumonia, polycythemia vera who was admitted with altered mental status and also now with acute respiratory failure.  1.  Acute kidney injury/diabetes mellitus type 2 with chronic kidney disease/chronic kidney disease stage II/proteinuria.  Baseline EGFR was greater than 60 however patient does have history of proteinuria.  Now has acute kidney injury along with hyperkalemia.  Patient did undergo 1 dialysis session yesterday.  No immediate need for repeat session today.  Reevaluate tomorrow if oliguria persists.  Hypercalcemia noted therefore check SPEP and UPEP.  Also check renal ultrasound to make sure no underlying obstruction.  2.  Acute respiratory failure.  Patient maintained on the ventilator.  Weaning as per pulmonary/critical care.     LOS: 2 Keeara Frees 2/11/20222:58 PM

## 2020-12-25 NOTE — Progress Notes (Signed)
Angelica Juarez, (Niece of [place]), called requesting update, password obtained and update given. All questions answered at that time.

## 2020-12-25 NOTE — Telephone Encounter (Signed)
Pt is currently in the hospital and may be there for a few days. Unable to schedule pulm appt at this time.

## 2020-12-25 NOTE — Progress Notes (Signed)
PHARMACY - PHYSICIAN COMMUNICATION CRITICAL VALUE ALERT - BLOOD CULTURE IDENTIFICATION (BCID)  Single set: G[facility] Aerobic bottle only, GPR Anaerobic Bottle Only   Name of Provider Contacted: Marda Stalker, NP  Changes to prescribed antibiotics required: Pt currently on Azithromycin and Ceftriaxone.  No changes made to abx by provider at this time.  Renda Rolls, PharmD, [facility] 12/25/2020 1:09 AM

## 2020-12-26 ENCOUNTER — Inpatient Hospital Stay: Payer: Medicare Other

## 2020-12-26 DIAGNOSIS — J9602 Acute respiratory failure with hypercapnia: Secondary | ICD-10-CM

## 2020-12-26 DIAGNOSIS — G9341 Metabolic encephalopathy: Secondary | ICD-10-CM | POA: Diagnosis not present

## 2020-12-26 DIAGNOSIS — J9601 Acute respiratory failure with hypoxia: Secondary | ICD-10-CM | POA: Diagnosis not present

## 2020-12-26 DIAGNOSIS — A419 Sepsis, unspecified organism: Secondary | ICD-10-CM | POA: Diagnosis not present

## 2020-12-26 DIAGNOSIS — J9621 Acute and chronic respiratory failure with hypoxia: Secondary | ICD-10-CM

## 2020-12-26 DIAGNOSIS — E7220 Disorder of urea cycle metabolism, unspecified: Secondary | ICD-10-CM

## 2020-12-26 DIAGNOSIS — J9622 Acute and chronic respiratory failure with hypercapnia: Secondary | ICD-10-CM

## 2020-12-26 LAB — COMPREHENSIVE METABOLIC PANEL
ALT: 12 U/L (ref 0–44)
AST: 30 U/L (ref 15–41)
Albumin: 2.9 g/dL — ABNORMAL LOW (ref 3.5–5.0)
Alkaline Phosphatase: 61 U/L (ref 38–126)
Anion gap: 9 (ref 5–15)
BUN: 54 mg/dL — ABNORMAL HIGH (ref 8–23)
CO2: 27 mmol/L (ref 22–32)
Calcium: 8.8 mg/dL — ABNORMAL LOW (ref 8.9–10.3)
Chloride: 103 mmol/L (ref 98–111)
Creatinine, Ser: 2.28 mg/dL — ABNORMAL HIGH (ref 0.44–1.00)
GFR, Estimated: 22 mL/min — ABNORMAL LOW (ref >60)
Glucose, Bld: 146 mg/dL — ABNORMAL HIGH (ref 70–99)
Potassium: 4.6 mmol/L (ref 3.5–5.1)
Sodium: 139 mmol/L (ref 135–145)
Total Bilirubin: 0.6 mg/dL (ref 0.3–1.2)
Total Protein: 5.2 g/dL — ABNORMAL LOW (ref 6.5–8.1)

## 2020-12-26 LAB — CBC WITH DIFFERENTIAL/PLATELET
Abs Immature Granulocytes: 0.3 10*3/uL — ABNORMAL HIGH (ref 0.00–0.07)
Basophils Absolute: 0.1 10*3/uL (ref 0.0–0.1)
Basophils Relative: 1 %
Eosinophils Absolute: 0.3 10*3/uL (ref 0.0–0.5)
Eosinophils Relative: 2 %
HCT: 33.7 % — ABNORMAL LOW (ref 36.0–46.0)
Hemoglobin: 10.1 g/dL — ABNORMAL LOW (ref 12.0–15.0)
Immature Granulocytes: 2 %
Lymphocytes Relative: 2 %
Lymphs Abs: 0.3 10*3/uL — ABNORMAL LOW (ref 0.7–4.0)
MCH: 27.2 pg (ref 26.0–34.0)
MCHC: 30 g/dL (ref 30.0–36.0)
MCV: 90.8 fL (ref 80.0–100.0)
Monocytes Absolute: 1.4 10*3/uL — ABNORMAL HIGH (ref 0.1–1.0)
Monocytes Relative: 9 %
Neutro Abs: 13.5 10*3/uL — ABNORMAL HIGH (ref 1.7–7.7)
Neutrophils Relative %: 84 %
Platelets: 363 10*3/uL (ref 150–400)
RBC: 3.71 MIL/uL — ABNORMAL LOW (ref 3.87–5.11)
RDW: 17.1 % — ABNORMAL HIGH (ref 11.5–15.5)
WBC: 15.9 10*3/uL — ABNORMAL HIGH (ref 4.0–10.5)
nRBC: 0 % (ref 0.0–0.2)

## 2020-12-26 LAB — HEPATITIS B DNA, ULTRAQUANTITATIVE, PCR
HBV DNA SERPL PCR-ACNC: NOT DETECTED IU/mL
HBV DNA SERPL PCR-LOG IU: UNDETERMINED log10 IU/mL

## 2020-12-26 LAB — PHOSPHORUS: Phosphorus: 2.7 mg/dL (ref 2.5–4.6)

## 2020-12-26 LAB — PROCALCITONIN: Procalcitonin: 0.43 ng/mL

## 2020-12-26 LAB — MAGNESIUM: Magnesium: 2.1 mg/dL (ref 1.7–2.4)

## 2020-12-26 LAB — PROTIME-INR
INR: 1.5 — ABNORMAL HIGH (ref 0.8–1.2)
Prothrombin Time: 17.7 seconds — ABNORMAL HIGH (ref 11.4–15.2)

## 2020-12-26 LAB — HEPATITIS B SURFACE ANTIBODY, QUANTITATIVE: Hep B S AB Quant (Post): <3.1 m[IU]/mL — ABNORMAL LOW (ref >9.9)

## 2020-12-26 MED ORDER — SODIUM CHLORIDE 0.9 % IV SOLN
INTRAVENOUS | Status: DC | PRN
  Administered 2020-12-26 – 2020-12-28 (×3): 250 mL via INTRAVENOUS

## 2020-12-26 MED ORDER — VANCOMYCIN HCL 1000 MG/200ML IV SOLN
1000.0000 mg | INTRAVENOUS | Status: DC
  Filled 2020-12-26: qty 200

## 2020-12-26 MED ORDER — SITAGLIPTIN PHOSPHATE 25 MG PO TABS
25.0000 mg | ORAL_TABLET | Freq: Every day | ORAL | Status: DC
  Administered 2020-12-26 – 2020-12-29 (×4): 25 mg
  Filled 2020-12-26 (×4): qty 1

## 2020-12-26 NOTE — Progress Notes (Signed)
Family called back and have consented to DNR status.

## 2020-12-26 NOTE — Progress Notes (Signed)
GOALS OF CARE DISCUSSION  The Clinical status was relayed to family in detail. Daughter  Updated and notified of patients medical condition.  Patient remains unresponsive and will not open eyes to command.    Patient is having a weak cough and struggling to remove secretions.   Patient with increased WOB and using accessory muscles to breathe Explained to family course of therapy and the modalities     Patient with Progressive multiorgan failure with a very high probablity of a very minimal chance of meaningful recovery despite all aggressive and optimal medical therapy. Patient is in the Dying  Process associated with Suffering.  Family understands the situation.  Patient remains Full CODE  Family are satisfied with Plan of action and management. All questions answered  Additional CC time 32 mins   Angelica Juarez Patricia Pesa, M.D.  [facility]  Medical Director [place] Director [facility] Cardio-Pulmonary Department

## 2020-12-26 NOTE — Progress Notes (Addendum)
Pharmacy Antibiotic Note  Angelica Juarez is a 80 y.o. female admitted on 12/22/2020 with respiratory failure secondary to pneumonia, multiorgan failure requiring intubation. Blood cultures with GPC in 1 bottle and GPR in 1 bottle, likely contaminants. On ceftriaxone and azithromycin which were discontinued. Pharmacy has been consulted for vancomycin and cefepime dosing.  -assessing need for HD daily  Plan: -Day 2- Vancomycin 2000 mg IV Loading dose given on 2/11 at  1220.  Nephrology following daily for possible HD needs. Based on current renal function Crcl 25.8 ml/min.  -Will order Vanc 1 gram IV q48h.  AUC 448  Cmin 11   Scr 2.28   Follow up renal function with morning labs and Nephrology's plan for possible HD.  -Cefepime 2 g IV q24h based on current renal function.    Height: 5\' 7"  (170.2 cm) Weight: 105.7 kg (233 lb 0.4 oz) IBW/kg (Calculated) : 61.6  Temp (24hrs), Avg:98.6 F (37 C), Min:98 F (36.7 C), Max:99 F (37.2 C)  Recent Labs  Lab 12/27/2020 1604 12/24/20 0626 12/24/20 1620 12/25/20 0351 12/26/20 0537  WBC 23.1* 21.8*  --  17.6* 15.9*  CREATININE 2.12* 1.84* 2.45* 1.87* 2.28*  LATICACIDVEN 1.8  --   --   --   --     Estimated Creatinine Clearance: 25.8 mL/min (A) (by C-G formula based on SCr of 2.28 mg/dL (H)).    Allergies  Allergen Reactions  . Iodinated Diagnostic Agents Anaphylaxis    Other reaction(s): Other (See Comments) Throat swells and extreme hives  . Lamictal [Lamotrigine] Rash    Suspected drug rash 09/2020  . Latex Itching  . Phenobarbital Hives  . Tape Rash    silicones    Antimicrobials this admission: Ceftriaxone 2/9 >> 2/11 Azithromycin 2/9 >> 2/11 Vancomycin 2/11 >> Cefepime 2/11 >>   Microbiology results: 2/9 BCx: 1/4 bottles GPC, 1/4 bottles GPR 2/10 Peritoneal fluid: pending 2/10 MRSA PCR: negative  Thank you for allowing pharmacy to be a part of this patient's care.  Maelin Kurkowski A, PharmD 12/26/2020 2:08 PM

## 2020-12-26 NOTE — Progress Notes (Signed)
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 fib not on anticoagulation due to history of retroperitoneal bleed, HFpEF, hypertension, cirrhosis  Admitted with acute respiratory failure secondary to pneumonia, multiorgan failure with AKI, liver failure, hepatorenal syndrome  Past Medical History:    has a past medical history of Allergy, Arthritis, Cancer ([place]), Chronic kidney disease, Colon polyps (05/21/14), COPD (chronic obstructive pulmonary disease) ([place]), Diabetes mellitus without complication ([place]), Dyspnea, Fracture closed, humerus, shaft, History of kidney stones, Hyperlipidemia (04/30/14), Hypertension, Impingement syndrome of right shoulder (12/29/15), Personal history of radiation therapy, Pneumonia, Rotator cuff tear (04/21/16), Seizures ([place]), and Sleep apnea.   Consults:  Nephrology  Procedures:    Significant Diagnostic Tests:  CT head 2/9-no acute findings.  Echocardiogram 2/10-LVEF 60-65%, normal RV systolic function, mild enlargement of RV size  Micro Data:  Blood culture 2/9-GPC, GPR  Antimicrobials:  Ceftriaxone 2/9-2/11 Azithromycin 2/9-2/11  Vanco 2/11 >>  Cefepime 2/11 >>   Significant Hospital Events:   2/9 Admitted for Pneumonia 2/10 US paracentesis 2L yellow fluid removed 2/10 emergently intubated after she failed BiPAP, HD catheter placed and dialyzed 2/11 remains intubated, multiorgan failure    CC  follow up respiratory failure  SUBJECTIVE Patient remains critically ill Prognosis is guarded  Vent Mode: PRVC FiO2 (%):  [30 %-35 %] 30 % Set Rate:  [16 bmp] 16 bmp Vt Set:  [500 mL] 500 mL PEEP:  [5 cmH20] 5 cmH20 CBC    Component Value Date/Time   WBC 15.9 (H) 12/26/2020 0537   RBC 3.71 (L) 12/26/2020 0537   HGB 10.1 (L) 12/26/2020 0537   HGB 13.3 02/20/2014 2351   HCT 33.7 (L) 12/26/2020 0537   HCT 41.0 02/20/2014 2351   PLT 363 12/26/2020 0537    PLT 397 02/20/2014 2351   MCV 90.8 12/26/2020 0537   MCV 96 02/20/2014 2351   MCH 27.2 12/26/2020 0537   MCHC 30.0 12/26/2020 0537   RDW 17.1 (H) 12/26/2020 0537   RDW 13.4 02/20/2014 2351   LYMPHSABS 0.3 (L) 12/26/2020 0537   LYMPHSABS 0.9 (L) 09/24/2012 1309   MONOABS 1.4 (H) 12/26/2020 0537   MONOABS 0.5 09/24/2012 1309   EOSABS 0.3 12/26/2020 0537   EOSABS 0.5 09/24/2012 1309   BASOSABS 0.1 12/26/2020 0537   BASOSABS 1 09/25/2012 0425   BASOSABS 0.1 09/24/2012 1309   BMP Latest Ref Rng & Units 12/26/2020 12/25/2020 12/24/2020  Glucose 70 - 99 mg/dL 146(H) 102(H) 134(H)  BUN 8 - 23 mg/dL 54(H) 42(H) 54(H)  Creatinine 0.44 - 1.00 mg/dL 2.28(H) 1.87(H) 2.45(H)  Sodium 135 - 145 mmol/L 139 141 140  Potassium 3.5 - 5.1 mmol/L 4.6 4.9 6.3(HH)  Chloride 98 - 111 mmol/L 103 104 101  CO2 22 - 32 mmol/L 27 28 24   Calcium 8.9 - 10.3 mg/dL 8.8(L) 9.0 10.2    BP (!) 111/55   Pulse 98   Temp 98.8 F (37.1 C) (Axillary)   Resp 16   Ht 5\' 7"  (1.702 m)   Wt 105.7 kg   SpO2 96%   BMI 36.50 kg/m    I/O last 3 completed shifts: In: 3001.1 [I.V.:423.4; NG/GT:705; IV Piggyback:1872.8] Out: 250 [Urine:250] No intake/output data recorded.  SpO2: 96 % O2 Flow Rate (L/min): 15 L/min FiO2 (%): 30 %  Estimated body mass index is 36.5 kg/m as calculated from the following:   Height as of this encounter: 5\' 7"  (1.702 m).   Weight  as of this encounter: 105.7 kg.  SIGNIFICANT EVENTS   REVIEW OF SYSTEMS  PATIENT IS UNABLE TO PROVIDE COMPLETE REVIEW OF SYSTEMS DUE TO SEVERE CRITICAL ILLNESS        PHYSICAL EXAMINATION:  GENERAL:critically ill appearing, +resp distress EYES: Pupils equal, round, reactive to light.  No scleral icterus.  MOUTH: Moist mucosal membrane. NECK: Supple.  PULMONARY: +rhonchi, +wheezing CARDIOVASCULAR: S1 and S2. Regular rate and rhythm. No murmurs, rubs, or gallops.  GASTROINTESTINAL: Soft, nontender, -distended.  Positive bowel sounds.    MUSCULOSKELETAL: No swelling, clubbing, or edema.  NEUROLOGIC: obtunded, GCS<8 SKIN:intact,warm,dry  MEDICATIONS: I have reviewed all medications and confirmed regimen as documented   CULTURE RESULTS   Recent Results (from the past 240 hour(s))  SARS CORONAVIRUS 2 (TAT 6-24 HRS) Nasopharyngeal Nasopharyngeal Swab     Status: None   Collection Time: 12/21/2020  4:04 PM   Specimen: Nasopharyngeal Swab  Result Value Ref Range Status   SARS Coronavirus 2 NEGATIVE NEGATIVE Final    Comment: (NOTE) SARS-CoV-2 target nucleic acids are NOT DETECTED.  The SARS-CoV-2 RNA is generally detectable in upper and lower respiratory specimens during the acute phase of infection. Negative results do not preclude SARS-CoV-2 infection, do not rule out co-infections with other pathogens, and should not be used as the sole basis for treatment or other patient management decisions. Negative results must be combined with clinical observations, patient history, and epidemiological information. The expected result is Negative.  Fact Sheet for Patients: SugarRoll.be  Fact Sheet for Healthcare Providers: https://www.woods-mathews.com/  This test is not yet approved or cleared by the [place] FDA and  has been authorized for detection and/or diagnosis of SARS-CoV-2 by FDA under an Emergency Use Authorization (EUA). This EUA will remain  in effect (meaning this test can be used) for the duration of the COVID-19 declaration under Se ction 564(b)(1) of the Act, 21 U.S.C. section 360bbb-3(b)(1), unless the authorization is terminated or revoked sooner.  Performed at [facility], [place] [address], [place], [place] [address]   Culture, blood (single)     Status: None (Preliminary result)   Collection Time: 12/30/2020  4:58 PM   Specimen: BLOOD  Result Value Ref Range Status   Specimen Description BLOOD RIGHT ANTECUBITAL  Final   Special Requests   Final     BOTTLES DRAWN AEROBIC AND ANAEROBIC Blood Culture adequate volume   Culture  Setup Time   Final    Organism ID to follow GRAM POSITIVE COCCI AEROBIC BOTTLE ONLY CRITICAL RESULT CALLED TO, READ BACK BY AND VERIFIED WITH: SUSAN WATSON PHARMD AT [place] ON 12/24/20 SNG ANAEROBIC BOTTLE ONLY GRAM POSITIVE RODS CRITICAL RESULT CALLED TO, READ BACK BY AND VERIFIED WITH: NATHAN BELUE @2236  ON 12/24/20 SKL Performed at [facility], [address], [place], [place] [address]    Culture GRAM POSITIVE COCCI  Final   Report Status PENDING  Incomplete  Blood Culture ID Panel (Reflexed)     Status: None   Collection Time: 12/25/2020  4:58 PM  Result Value Ref Range Status   Enterococcus faecalis NOT DETECTED NOT DETECTED Final   Enterococcus Faecium NOT DETECTED NOT DETECTED Final   Listeria monocytogenes NOT DETECTED NOT DETECTED Final   Staphylococcus species NOT DETECTED NOT DETECTED Final   Staphylococcus aureus (BCID) NOT DETECTED NOT DETECTED Final   Staphylococcus epidermidis NOT DETECTED NOT DETECTED Final   Staphylococcus lugdunensis NOT DETECTED NOT DETECTED Final   Streptococcus species NOT DETECTED NOT DETECTED Final  Streptococcus agalactiae NOT DETECTED NOT DETECTED Final   Streptococcus pneumoniae NOT DETECTED NOT DETECTED Final   Streptococcus pyogenes NOT DETECTED NOT DETECTED Final   A.calcoaceticus-baumannii NOT DETECTED NOT DETECTED Final   Bacteroides fragilis NOT DETECTED NOT DETECTED Final   Enterobacterales NOT DETECTED NOT DETECTED Final   Enterobacter cloacae complex NOT DETECTED NOT DETECTED Final   Escherichia coli NOT DETECTED NOT DETECTED Final   Klebsiella aerogenes NOT DETECTED NOT DETECTED Final   Klebsiella oxytoca NOT DETECTED NOT DETECTED Final   Klebsiella pneumoniae NOT DETECTED NOT DETECTED Final   Proteus species NOT DETECTED NOT DETECTED Final   Salmonella species NOT DETECTED NOT DETECTED Final   Serratia marcescens NOT DETECTED NOT DETECTED  Final   Haemophilus influenzae NOT DETECTED NOT DETECTED Final   Neisseria meningitidis NOT DETECTED NOT DETECTED Final   Pseudomonas aeruginosa NOT DETECTED NOT DETECTED Final   Stenotrophomonas maltophilia NOT DETECTED NOT DETECTED Final   Candida albicans NOT DETECTED NOT DETECTED Final   Candida auris NOT DETECTED NOT DETECTED Final   Candida glabrata NOT DETECTED NOT DETECTED Final   Candida krusei NOT DETECTED NOT DETECTED Final   Candida parapsilosis NOT DETECTED NOT DETECTED Final   Candida tropicalis NOT DETECTED NOT DETECTED Final   Cryptococcus neoformans/gattii NOT DETECTED NOT DETECTED Final    Comment: Performed at [facility], [address] [place], [place] [address]  Body fluid culture     Status: None (Preliminary result)   Collection Time: 12/24/20  2:37 PM   Specimen: PATH Cytology Peritoneal fluid  Result Value Ref Range Status   Specimen Description   Final    PERITONEAL Performed at [facility], [address], [place], [place] [address]    Special Requests   Final    NONE Performed at [facility], [address] [place], [place] [address]    Gram Stain   Final    RARE WBC PRESENT,BOTH PMN AND MONONUCLEAR NO ORGANISMS SEEN Performed at [facility], [address] [place], [place] [address]    Culture PENDING  Incomplete   Report Status PENDING  Incomplete  Aerobic/Anaerobic Culture (surgical/deep wound)     Status: None (Preliminary result)   Collection Time: 12/24/20  2:37 PM   Specimen: PATH Cytology Peritoneal fluid  Result Value Ref Range Status   Specimen Description   Final    PERITONEAL Performed at [facility], [address] [place], [place] [address]    Special Requests   Final    NONE Performed at [facility], [address] [place], [place] [address]    Gram Stain   Final    FEW WBC PRESENT, PREDOMINANTLY MONONUCLEAR NO ORGANISMS SEEN Performed at [place], [place] [address], [place], [place] [address]    Culture PENDING  Incomplete   Report Status PENDING  Incomplete  MRSA PCR Screening     Status: None   Collection Time: 12/24/20 11:22 PM   Specimen: Nasopharyngeal  Result Value Ref Range Status   MRSA by PCR NEGATIVE NEGATIVE Final    Comment:        The GeneXpert MRSA Assay (FDA approved for NASAL specimens only), is one component of a comprehensive MRSA colonization surveillance program. It is not intended to diagnose MRSA infection nor to guide or monitor treatment for MRSA infections. Performed at [facility], [address] [place], [place] [address]           IMAGING  US RENAL  Result Date: 12/25/2020 CLINICAL DATA:  Acute kidney injury. EXAM: RENAL / URINARY TRACT ULTRASOUND COMPLETE COMPARISON:  CT abdomen pelvis, 01/16/2020 FINDINGS: Right Kidney: Renal measurements: 10.2 x 6.1 x 4.0 cm = volume: 130.8 mL. Echogenicity within normal limits. No mass or hydronephrosis visualized. Left Kidney: Renal measurements: 11.2 x 6.3 x 7.1 cm = volume: 263.0 mL. Echogenicity within normal limits. No mass or hydronephrosis visualized. Bladder: Not visualized.  Decompressed with a Foley catheter. Other: Small amount of ascites. IMPRESSION: 1. Somewhat limited exam due to body habitus. Kidneys appear within normal limits. No hydronephrosis. 2. Bladder not visualized. 3. Small amount of ascites Electronically Signed   By: Lajean Manes M.D.   On: 12/25/2020 18:41   DG Chest Port 1 View  Result Date: 12/26/2020 CLINICAL DATA:  Acute respiratory failure. EXAM: PORTABLE CHEST 1 VIEW COMPARISON:  Chest x-ray dated December 24, 2020. FINDINGS: Unchanged endotracheal and enteric tubes. Unchanged left internal central venous catheter. Unchanged mild cardiomegaly. Slightly improved diffuse interstitial thickening. Unchanged layering bilateral pleural effusions and bibasilar atelectasis. No pneumothorax. No acute osseous abnormality.  IMPRESSION: 1. Slightly improved pulmonary edema. 2. Unchanged layering bilateral pleural effusions and bibasilar atelectasis. Electronically Signed   By: Titus Dubin M.D.   On: 12/26/2020 07:19     Nutrition Status: Nutrition Problem: Inadequate oral intake Etiology: inability to eat (pt sedated and ventilated) Signs/Symptoms: NPO status       Indwelling Urinary Catheter continued, requirement due to   Reason to continue Indwelling Urinary Catheter strict Intake/Output monitoring for hemodynamic instability   Central Line/ continued, requirement due to  Reason to continue [place] of central venous pressure or other hemodynamic parameters and poor IV access   Ventilator continued, requirement due to severe respiratory failure   Ventilator Sedation RASS 0 to -2      ASSESSMENT AND PLAN SYNOPSIS 80 yo morbidly obese white female with acute decompensation of liver cirrhosis with progressive renal failure leading to severe hypoxic resp failure with pulm edema   Severe ACUTE Hypoxic and Hypercapnic Respiratory Failure -continue Full MV support -continue Bronchodilator Therapy -Wean Fio2 and PEEP as tolerated VAP/VENT bundle implementation  SEVERE COPD EXACERBATION -continue IV steroids as prescribed -continue NEB THERAPY as prescribed -morphine as needed -wean fio2 as needed and tolerated    ACUTE DIASTOLIC CARDIAC FAILURE- EF -oxygen as needed -Lasix as tolerated   Morbid obesity, possible OSA.   Will certainly impact respiratory mechanics, ventilator weaning Suspect will need to consider additional PEEP   ACUTE KIDNEY INJURY/Renal Failure -continue Foley Catheter-assess need -Avoid nephrotoxic agents -Follow urine output, BMP -Ensure adequate renal perfusion, optimize oxygenation -Renal dose medications HD as needed     NEUROLOGY Acute toxic metabolic encephalopathy, need for sedation Goal RASS -2 to -3 Wake up assessment  pending   CARDIAC ICU monitoring  ID -continue IV abx as prescibed -follow up cultures  GI GI PROPHYLAXIS as indicated   DIET-->TF's as tolerated Constipation protocol as indicated  ENDO - will use ICU hypoglycemic\Hyperglycemia protocol if indicated     ELECTROLYTES -follow labs as needed -replace as needed -pharmacy consultation and following   DVT/GI PRX ordered and assessed TRANSFUSIONS AS NEEDED MONITOR FSBS I Assessed the need for Labs I Assessed the need for Foley I Assessed the need for Central Venous Line Family Discussion when available I Assessed the need for Mobilization I made an Assessment of medications to be adjusted accordingly Safety Risk assessment completed   CASE DISCUSSED IN MULTIDISCIPLINARY  ROUNDS WITH ICU TEAM  Critical Care Time devoted to patient care services described in this note is 51 minutes.   Overall, patient is critically ill, prognosis is guarded.  Patient with Multiorgan failure and at high risk for cardiac arrest and death.    Corrin Parker, M.D.  [facility]  Medical Director [place] Director [facility] Cardio-Pulmonary Department

## 2020-12-26 NOTE — Progress Notes (Signed)
[facility]  ROUNDING NOTE   Subjective:   [name] admitted to [facility] on 07/21/6733 for Metabolic encephalopathy [L93.79] Abdominal distension [R14.0] Hypoxia [R09.02] Other ascites [R18.8] Acute on chronic diastolic congestive heart failure ([place]) [I50.33] AKI (acute kidney injury) ([place]) [N17.9] COPD with acute exacerbation ([place]) [J44.1] Sepsis ([place]) [A41.9] Sepsis with acute hypoxic respiratory failure without septic shock, due to unspecified organism ([place]) [A41.9, R65.20, J96.01]  Patient with hyperkalemia and acute kidney injury. Requiring emergent hemodialysis treatment on admission.  Large volume paracentesis with 2 liter removed.   UOP 160m last 24 hours  Potassium at goal. Creatinine trending up. No acute indication for dialysis at this time.   Objective:  Vital signs in last 24 hours:  Temp:  [98.1 F (36.7 C)-99 F (37.2 C)] 98.8 F (37.1 C) (02/12 0400) Pulse Rate:  [86-109] 100 (02/12 1000) Resp:  [16-24] 17 (02/12 1000) BP: (94-136)/(16-65) 118/64 (02/12 1000) SpO2:  [91 %-97 %] 92 % (02/12 1000) FiO2 (%):  [30 %-35 %] 30 % (02/12 0740) Weight:  [105.7 kg] 105.7 kg (02/12 0432)  Weight change: 3.1 kg Filed Weights   12/24/20 1700 12/25/20 0309 12/26/20 0432  Weight: 102.6 kg 100.6 kg 105.7 kg    Intake/Output: I/O last 3 completed shifts: In: 3001.1 [I.V.:423.4; NG/GT:705; IV Piggyback:1872.8] Out: 250 [Urine:250]   Intake/Output this shift:  No intake/output data recorded.  Physical Exam: General: Critically ill  Head: ETT   Eyes: Pinpoint pupils  Neck: trachea midline  Lungs:  PRVC 30%  Heart: irregular  Abdomen:  Soft, not distended  Extremities:  no peripheral edema.  Neurologic: Intubated and sedated  Skin: No lesions  Access: Left IJ temp HD catheter    Basic Metabolic Panel: Recent Labs  Lab 12/22/2020 1604 12/24/20 0626 12/24/20 1620 12/25/20 0351 12/26/20 0537  NA 140 141 140 141 139  K 5.5* 5.0 6.3* 4.9 4.6   CL 101 110 101 104 103  CO2 25 19* 24 28 27   GLUCOSE 123* 118* 134* 102* 146*  BUN 42* 42* 54* 42* 54*  CREATININE 2.12* 1.84* 2.45* 1.87* 2.28*  CALCIUM 10.6* 8.3* 10.2 9.0 8.8*  MG 2.2  --   --  1.8 2.1  PHOS  --   --   --  3.3 2.7    Liver Function Tests: Recent Labs  Lab 12/27/2020 1604 12/25/20 0351 12/26/20 0537  AST 33 28 30  ALT 15 13 12   ALKPHOS 88 62 61  BILITOT 1.1 0.8 0.6  PROT 6.8 5.5* 5.2*  ALBUMIN 3.8 3.1* 2.9*   No results for input(s): LIPASE, AMYLASE in the last 168 hours. Recent Labs  Lab 12/24/20 1201 12/24/20 2355 12/25/20 0351  AMMONIA 58* 33 29    CBC: Recent Labs  Lab 01/01/2021 1604 12/24/20 0626 12/25/20 0351 12/26/20 0537  WBC 23.1* 21.8* 17.6* 15.9*  NEUTROABS 20.7*  --  15.9* 13.5*  HGB 11.6* 11.0* 9.8* 10.1*  HCT 39.4 37.8 32.4* 33.7*  MCV 92.9 93.6 90.5 90.8  PLT 605* 563* 402* 363    Cardiac Enzymes: No results for input(s): CKTOTAL, CKMB, CKMBINDEX, TROPONINI in the last 168 hours.  BNP: Invalid input(s): POCBNP  CBG: Recent Labs  Lab 12/24/20 1713 12/24/20 1930 12/24/20 2352 12/25/20 0334 12/25/20 0728  GLUCAP 121* 122* 102* 95 81    Microbiology: Results for orders placed or performed during the hospital encounter of 01/11/2021  SARS CORONAVIRUS 2 (TAT 6-24 HRS) Nasopharyngeal Nasopharyngeal Swab     Status: None  Collection Time: 12/20/2020  4:04 PM   Specimen: Nasopharyngeal Swab  Result Value Ref Range Status   SARS Coronavirus 2 NEGATIVE NEGATIVE Final    Comment: (NOTE) SARS-CoV-2 target nucleic acids are NOT DETECTED.  The SARS-CoV-2 RNA is generally detectable in upper and lower respiratory specimens during the acute phase of infection. Negative results do not preclude SARS-CoV-2 infection, do not rule out co-infections with other pathogens, and should not be used as the sole basis for treatment or other patient management decisions. Negative results must be combined with clinical  observations, patient history, and epidemiological information. The expected result is Negative.  Fact Sheet for Patients: SugarRoll.be  Fact Sheet for Healthcare Providers: https://www.woods-mathews.com/  This test is not yet approved or cleared by the [place] FDA and  has been authorized for detection and/or diagnosis of SARS-CoV-2 by FDA under an Emergency Use Authorization (EUA). This EUA will remain  in effect (meaning this test can be used) for the duration of the COVID-19 declaration under Se ction 564(b)(1) of the Act, 21 U.S.C. section 360bbb-3(b)(1), unless the authorization is terminated or revoked sooner.  Performed at [facility], [address] [place], [place] [address]   Culture, blood (single)     Status: None (Preliminary result)   Collection Time: 01/11/2021  4:58 PM   Specimen: BLOOD  Result Value Ref Range Status   Specimen Description   Final    BLOOD RIGHT ANTECUBITAL Performed at [facility], [address], [place], [place] [address]    Special Requests   Final    BOTTLES DRAWN AEROBIC AND ANAEROBIC Blood Culture adequate volume Performed at [facility], [address] [place], [place] [address]    Culture  Setup Time   Final    GRAM POSITIVE COCCI AEROBIC BOTTLE ONLY CRITICAL RESULT CALLED TO, READ BACK BY AND VERIFIED WITH: SUSAN WATSON PHARMD AT 1705 ON 12/24/20 SNG ANAEROBIC BOTTLE ONLY GRAM POSITIVE RODS CRITICAL RESULT CALLED TO, READ BACK BY AND VERIFIED WITH: NATHAN BELUE @2236  ON 12/24/20 SKL Performed at [facility], [address] [place], [place] [address]    Culture GRAM POSITIVE COCCI  Final   Report Status PENDING  Incomplete  Blood Culture ID Panel (Reflexed)     Status: None   Collection Time: 12/24/2020  4:58 PM  Result Value Ref Range Status   Enterococcus faecalis NOT DETECTED NOT DETECTED Final   Enterococcus Faecium NOT DETECTED NOT DETECTED  Final   Listeria monocytogenes NOT DETECTED NOT DETECTED Final   Staphylococcus species NOT DETECTED NOT DETECTED Final   Staphylococcus aureus (BCID) NOT DETECTED NOT DETECTED Final   Staphylococcus epidermidis NOT DETECTED NOT DETECTED Final   Staphylococcus lugdunensis NOT DETECTED NOT DETECTED Final   Streptococcus species NOT DETECTED NOT DETECTED Final   Streptococcus agalactiae NOT DETECTED NOT DETECTED Final   Streptococcus pneumoniae NOT DETECTED NOT DETECTED Final   Streptococcus pyogenes NOT DETECTED NOT DETECTED Final   A.calcoaceticus-baumannii NOT DETECTED NOT DETECTED Final   Bacteroides fragilis NOT DETECTED NOT DETECTED Final   Enterobacterales NOT DETECTED NOT DETECTED Final   Enterobacter cloacae complex NOT DETECTED NOT DETECTED Final   Escherichia coli NOT DETECTED NOT DETECTED Final   Klebsiella aerogenes NOT DETECTED NOT DETECTED Final   Klebsiella oxytoca NOT DETECTED NOT DETECTED Final   Klebsiella pneumoniae NOT DETECTED NOT DETECTED Final   Proteus species NOT DETECTED NOT DETECTED Final   Salmonella species NOT DETECTED NOT DETECTED Final   Serratia  marcescens NOT DETECTED NOT DETECTED Final   Haemophilus influenzae NOT DETECTED NOT DETECTED Final   Neisseria meningitidis NOT DETECTED NOT DETECTED Final   Pseudomonas aeruginosa NOT DETECTED NOT DETECTED Final   Stenotrophomonas maltophilia NOT DETECTED NOT DETECTED Final   Candida albicans NOT DETECTED NOT DETECTED Final   Candida auris NOT DETECTED NOT DETECTED Final   Candida glabrata NOT DETECTED NOT DETECTED Final   Candida krusei NOT DETECTED NOT DETECTED Final   Candida parapsilosis NOT DETECTED NOT DETECTED Final   Candida tropicalis NOT DETECTED NOT DETECTED Final   Cryptococcus neoformans/gattii NOT DETECTED NOT DETECTED Final    Comment: Performed at [facility], [address] [place], [place] [address]  Body fluid culture     Status: None (Preliminary result)   Collection Time:  12/24/20  2:37 PM   Specimen: PATH Cytology Peritoneal fluid  Result Value Ref Range Status   Specimen Description   Final    PERITONEAL Performed at [facility], [address] [place], [place] [address]    Special Requests   Final    NONE Performed at [facility], [place], [place] [address]    Gram Stain   Final    RARE WBC PRESENT,BOTH PMN AND MONONUCLEAR NO ORGANISMS SEEN Performed at [facility], [address] [place], [place] [address]    Culture PENDING  Incomplete   Report Status PENDING  Incomplete  Aerobic/Anaerobic Culture (surgical/deep wound)     Status: None (Preliminary result)   Collection Time: 12/24/20  2:37 PM   Specimen: PATH Cytology Peritoneal fluid  Result Value Ref Range Status   Specimen Description   Final    PERITONEAL Performed at [facility], [address], [place], [place] [address]    Special Requests   Final    NONE Performed at [facility], [address] [place], [place] [address]    Gram Stain   Final    FEW WBC PRESENT, PREDOMINANTLY MONONUCLEAR NO ORGANISMS SEEN Performed at [facility], [place] [address] [place], [place] [address]    Culture PENDING  Incomplete   Report Status PENDING  Incomplete  MRSA PCR Screening     Status: None   Collection Time: 12/24/20 11:22 PM   Specimen: Nasopharyngeal  Result Value Ref Range Status   MRSA by PCR NEGATIVE NEGATIVE Final    Comment:        The GeneXpert MRSA Assay (FDA approved for NASAL specimens only), is one component of a comprehensive MRSA colonization surveillance program. It is not intended to diagnose MRSA infection nor to guide or monitor treatment for MRSA infections. Performed at [facility], [address] [place], [place] [address]     Coagulation Studies: Recent Labs    12/26/20 0537  LABPROT 17.7*  INR 1.5*    Urinalysis: Recent Labs    12/17/2020 2143  COLORURINE  AMBER*  LABSPEC 1.016  PHURINE 5.0  GLUCOSEU NEGATIVE  HGBUR NEGATIVE  BILIRUBINUR NEGATIVE  KETONESUR 5*  PROTEINUR 100*  NITRITE NEGATIVE  LEUKOCYTESUR TRACE*      Imaging: DG Abd 1 View  Result Date: 12/24/2020 CLINICAL DATA:  80 year old female with NG placement. EXAM: ABDOMEN - 1 VIEW COMPARISON:  CT abdomen pelvis dated 01/16/2020. FINDINGS: Enteric tube with tip and side-port in the left upper abdomen, likely in the proximal stomach. No bowel dilatation. Multiple surgical clips noted over the abdomen. There is cardiomegaly. Bibasilar densities, likely combination  of pleural effusion and associated atelectasis or infiltrate. There is atherosclerotic calcification of the aorta. No acute osseous pathology. IMPRESSION: Enteric tube with tip in the proximal stomach. Electronically Signed   By: Anner Crete M.D.   On: 12/24/2020 22:06   US RENAL  Result Date: 12/25/2020 CLINICAL DATA:  Acute kidney injury. EXAM: RENAL / URINARY TRACT ULTRASOUND COMPLETE COMPARISON:  CT abdomen pelvis, 01/16/2020 FINDINGS: Right Kidney: Renal measurements: 10.2 x 6.1 x 4.0 cm = volume: 130.8 mL. Echogenicity within normal limits. No mass or hydronephrosis visualized. Left Kidney: Renal measurements: 11.2 x 6.3 x 7.1 cm = volume: 263.0 mL. Echogenicity within normal limits. No mass or hydronephrosis visualized. Bladder: Not visualized.  Decompressed with a Foley catheter. Other: Small amount of ascites. IMPRESSION: 1. Somewhat limited exam due to body habitus. Kidneys appear within normal limits. No hydronephrosis. 2. Bladder not visualized. 3. Small amount of ascites Electronically Signed   By: Lajean Manes M.D.   On: 12/25/2020 18:41   US Paracentesis  Result Date: 12/24/2020 INDICATION: Abdominal distension.  Evaluate for ascites and paracentesis. EXAM: ULTRASOUND GUIDED  PARACENTESIS MEDICATIONS: None. COMPLICATIONS: None immediate. PROCEDURE: Patient has altered mental status and telephone consent  was obtained from the patient's significant other. Initial ultrasound scanning demonstrates a moderate amount of ascites identified in the left abdomen. The left abdomen was prepped and draped in the usual sterile fashion. 1% lidocaine was used for local anesthesia. Following this, a 6 Fr Safe-T-Centesis catheter was introduced. An ultrasound image was saved for documentation purposes. The paracentesis was performed. The catheter was removed and a dressing was applied. The patient tolerated the procedure well without immediate post procedural complication. Patient received post-procedure intravenous albumin; see nursing notes for details. FINDINGS: A total of approximately 2 L of dark yellow fluid was removed. Samples were sent to the laboratory as requested by the clinical team. IMPRESSION: Successful ultrasound-guided paracentesis yielding 2 liters of peritoneal fluid. Electronically Signed   By: Markus Daft M.D.   On: 12/24/2020 15:27   DG Chest Port 1 View  Result Date: 12/26/2020 CLINICAL DATA:  Acute respiratory failure. EXAM: PORTABLE CHEST 1 VIEW COMPARISON:  Chest x-ray dated December 24, 2020. FINDINGS: Unchanged endotracheal and enteric tubes. Unchanged left internal central venous catheter. Unchanged mild cardiomegaly. Slightly improved diffuse interstitial thickening. Unchanged layering bilateral pleural effusions and bibasilar atelectasis. No pneumothorax. No acute osseous abnormality. IMPRESSION: 1. Slightly improved pulmonary edema. 2. Unchanged layering bilateral pleural effusions and bibasilar atelectasis. Electronically Signed   By: Titus Dubin M.D.   On: 12/26/2020 07:19   Portable Chest x-ray  Result Date: 12/24/2020 CLINICAL DATA:  Confirm endotracheal tube and central line placement. EXAM: PORTABLE CHEST 1 VIEW COMPARISON:  Chest radiograph December 23, 2020 FINDINGS: Endotracheal tube with tip overlying the midthoracic trachea. Nasogastric tube coursing below the diaphragm with tip  obscured by collimation. Left IJ central venous catheter with tip overlying the SVC. Similar cardiomegaly. Aortic atherosclerosis. Increased diffuse interstitial opacities. Similar small bilateral pleural effusions. No pneumothorax the visualized skeletal structures are unchanged. IMPRESSION: 1. Endotracheal tube with tip overlying the midthoracic trachea. 2. New left IJ central venous catheter with tip overlying the SVC. No pneumothorax. 3. Increased diffuse interstitial opacities suggesting pulmonary edema. Similar small bilateral pleural effusions. Electronically Signed   By: Dahlia Bailiff MD   On: 12/24/2020 19:03     Medications:   . sodium chloride 10 mL/hr at 12/26/20 0700  . albumin human    . ceFEPime (MAXIPIME) IV Stopped (  12/25/20 1431)  . famotidine (PEPCID) IV Stopped (12/25/20 2137)  . feeding supplement (VITAL AF 1.2 CAL) 1,000 mL (12/25/20 1354)  . fentaNYL infusion INTRAVENOUS Stopped (12/26/20 0830)  . levETIRAcetam Stopped (12/26/20 0127)  . norepinephrine (LEVOPHED) Adult infusion Stopped (12/24/20 1929)   . atorvastatin  40 mg Per Tube Daily  . chlorhexidine gluconate (MEDLINE KIT)  15 mL Mouth Rinse BID  . Chlorhexidine Gluconate Cloth  6 each Topical Daily  . docusate  100 mg Per Tube BID  . etomidate  20 mg Intravenous Once  . feeding supplement (PROSource TF)  45 mL Per Tube TID  . fluticasone  2 spray Each Nare Daily  . heparin  5,000 Units Subcutaneous Q8H  . lactulose  20 g Per Tube BID  . mouth rinse  15 mL Mouth Rinse 10 times per day  . montelukast  10 mg Per Tube QHS  . multivitamin  15 mL Per Tube Daily  . nystatin cream   Topical BID  . polyethylene glycol  17 g Per Tube Daily  . sitaGLIPtin  25 mg Per Tube Daily  . umeclidinium-vilanterol  1 puff Inhalation Daily  . vancomycin variable dose per unstable renal function (pharmacist dosing)   Does not apply See admin instructions   sodium chloride, acetaminophen **OR** acetaminophen, albumin human,  fentaNYL, ipratropium-albuterol, metoprolol tartrate, midazolam, ondansetron **OR** ondansetron (ZOFRAN) IV  Assessment/ Plan:  Ms. MADILYN CEPHAS is a 80 y.o. white female with sleep apnea, seizure disorder, atrial fibrillation, congestive heart failure, hypertension, hyperlipidemia, depression, diabetes mellitus type II, COPD, polycythemia who is admitted to [facility] on 03/21/5928 for Metabolic encephalopathy [W44.62] Abdominal distension [R14.0] Hypoxia [R09.02] Other ascites [R18.8] Acute on chronic diastolic congestive heart failure ([place]) [I50.33] AKI (acute kidney injury) ([place]) [N17.9] COPD with acute exacerbation ([place]) [J44.1] Sepsis ([place]) [A41.9] Sepsis with acute hypoxic respiratory failure without septic shock, due to unspecified organism ([place]) [A41.9, R65.20, J96.01]  1. 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 Baseline creatinine of 1.03, GFR of 56 on 10/18/2020.  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  Monitor daily for dialysis need.  - holding outpatient valsartan, hydrochlorothiazide, potassium chloride, metformin and furosemide.   2. Acute respiratory failure: requiring intubation and mechanical ventilation. Secondary to pneumonia. On cefepime and vancomycin.   3. Hypotension: with sepsis: requiring vasopressors: norepinephrine. Empiric antibiotics as above: cefepime and vancomycin With pneumonia and not enough wbc in peritoneal fluid to diagnose with spontaneous bacterial peritonitis.   4. Anemia with renal failure: with history of polycythemia.     LOS: 3 Zyon Grout 2/12/202210:59 AM

## 2020-12-26 NOTE — Progress Notes (Signed)
Daughter Beverlee Nims called to clarify about the patient's code status. She said she spoke with  The patient's partner Collins [place] and changed pt's status to DNR.

## 2020-12-27 DIAGNOSIS — A419 Sepsis, unspecified organism: Secondary | ICD-10-CM | POA: Diagnosis not present

## 2020-12-27 DIAGNOSIS — J9621 Acute and chronic respiratory failure with hypoxia: Secondary | ICD-10-CM | POA: Diagnosis not present

## 2020-12-27 DIAGNOSIS — G9341 Metabolic encephalopathy: Secondary | ICD-10-CM | POA: Diagnosis not present

## 2020-12-27 DIAGNOSIS — I5033 Acute on chronic diastolic (congestive) heart failure: Secondary | ICD-10-CM | POA: Diagnosis not present

## 2020-12-27 LAB — COMPREHENSIVE METABOLIC PANEL
ALT: 12 U/L (ref 0–44)
AST: 23 U/L (ref 15–41)
Albumin: 2.5 g/dL — ABNORMAL LOW (ref 3.5–5.0)
Alkaline Phosphatase: 55 U/L (ref 38–126)
Anion gap: 8 (ref 5–15)
BUN: 65 mg/dL — ABNORMAL HIGH (ref 8–23)
CO2: 27 mmol/L (ref 22–32)
Calcium: 8.5 mg/dL — ABNORMAL LOW (ref 8.9–10.3)
Chloride: 105 mmol/L (ref 98–111)
Creatinine, Ser: 2.57 mg/dL — ABNORMAL HIGH (ref 0.44–1.00)
GFR, Estimated: 19 mL/min — ABNORMAL LOW (ref >60)
Glucose, Bld: 150 mg/dL — ABNORMAL HIGH (ref 70–99)
Potassium: 4.3 mmol/L (ref 3.5–5.1)
Sodium: 140 mmol/L (ref 135–145)
Total Bilirubin: 0.6 mg/dL (ref 0.3–1.2)
Total Protein: 4.8 g/dL — ABNORMAL LOW (ref 6.5–8.1)

## 2020-12-27 LAB — CBC WITH DIFFERENTIAL/PLATELET
Abs Immature Granulocytes: 0.53 10*3/uL — ABNORMAL HIGH (ref 0.00–0.07)
Basophils Absolute: 0.1 10*3/uL (ref 0.0–0.1)
Basophils Relative: 1 %
Eosinophils Absolute: 0.3 10*3/uL (ref 0.0–0.5)
Eosinophils Relative: 2 %
HCT: 32.2 % — ABNORMAL LOW (ref 36.0–46.0)
Hemoglobin: 9.5 g/dL — ABNORMAL LOW (ref 12.0–15.0)
Immature Granulocytes: 4 %
Lymphocytes Relative: 2 %
Lymphs Abs: 0.3 10*3/uL — ABNORMAL LOW (ref 0.7–4.0)
MCH: 26.9 pg (ref 26.0–34.0)
MCHC: 29.5 g/dL — ABNORMAL LOW (ref 30.0–36.0)
MCV: 91.2 fL (ref 80.0–100.0)
Monocytes Absolute: 1.4 10*3/uL — ABNORMAL HIGH (ref 0.1–1.0)
Monocytes Relative: 9 %
Neutro Abs: 12.7 10*3/uL — ABNORMAL HIGH (ref 1.7–7.7)
Neutrophils Relative %: 82 %
Platelets: 333 10*3/uL (ref 150–400)
RBC: 3.53 MIL/uL — ABNORMAL LOW (ref 3.87–5.11)
RDW: 17.1 % — ABNORMAL HIGH (ref 11.5–15.5)
WBC: 15.3 10*3/uL — ABNORMAL HIGH (ref 4.0–10.5)
nRBC: 0.2 % (ref 0.0–0.2)

## 2020-12-27 LAB — VANCOMYCIN, RANDOM: Vancomycin Rm: 14

## 2020-12-27 LAB — MAGNESIUM: Magnesium: 2.4 mg/dL (ref 1.7–2.4)

## 2020-12-27 LAB — PHOSPHORUS: Phosphorus: 3.2 mg/dL (ref 2.5–4.6)

## 2020-12-27 LAB — C4 COMPLEMENT: Complement C4, Body Fluid: 4 mg/dL — ABNORMAL LOW (ref 12–38)

## 2020-12-27 LAB — C3 COMPLEMENT: C3 Complement: 51 mg/dL — ABNORMAL LOW (ref 82–167)

## 2020-12-27 LAB — GLUCOSE, CAPILLARY
Glucose-Capillary: 165 mg/dL — ABNORMAL HIGH (ref 70–99)
Glucose-Capillary: 170 mg/dL — ABNORMAL HIGH (ref 70–99)
Glucose-Capillary: 159 mg/dL — ABNORMAL HIGH (ref 70–99)
Glucose-Capillary: 141 mg/dL — ABNORMAL HIGH (ref 70–99)

## 2020-12-27 LAB — TOTAL BILIRUBIN, BODY FLUID: Total bilirubin, fluid: 0.2 mg/dL

## 2020-12-27 MED ORDER — NOREPINEPHRINE 16 MG/250ML-% IV SOLN
0.0000 ug/min | INTRAVENOUS | Status: DC
  Administered 2020-12-27: 2 ug/min via INTRAVENOUS
  Administered 2020-12-29: 6 ug/min via INTRAVENOUS
  Filled 2020-12-27 (×2): qty 250

## 2020-12-27 MED ORDER — VANCOMYCIN HCL 500 MG/100ML IV SOLN
500.0000 mg | INTRAVENOUS | Status: DC
  Administered 2020-12-27: 500 mg via INTRAVENOUS
  Filled 2020-12-27 (×2): qty 100

## 2020-12-27 MED ORDER — VANCOMYCIN VARIABLE DOSE PER UNSTABLE RENAL FUNCTION (PHARMACIST DOSING): Status: DC

## 2020-12-27 MED ORDER — FUROSEMIDE 10 MG/ML IJ SOLN
80.0000 mg | Freq: Once | INTRAMUSCULAR | Status: AC
  Administered 2020-12-27: 80 mg via INTRAVENOUS
  Filled 2020-12-27: qty 8

## 2020-12-27 NOTE — Progress Notes (Signed)
[facility]  ROUNDING NOTE   Subjective:   UOP 132m Creatinine 2.57 (2.28)  Patient made DNR yesterday Blood cultures with Aerococcus viridans  Restarted on norepinephrine  Objective:  Vital signs in last 24 hours:  Temp:  [97.7 F (36.5 C)-99 F (37.2 C)] 97.9 F (36.6 C) (02/13 0430) Pulse Rate:  [86-113] 93 (02/13 0730) Resp:  [16-27] 16 (02/13 0730) BP: (86-168)/(16-84) 96/52 (02/13 0730) SpO2:  [90 %-99 %] 96 % (02/13 0730) FiO2 (%):  [30 %-50 %] 40 % (02/13 0430) Weight:  [106.6 kg] 106.6 kg (02/13 0500)  Weight change: 0.9 kg Filed Weights   12/25/20 0309 12/26/20 0432 12/27/20 0500  Weight: 100.6 kg 105.7 kg 106.6 kg    Intake/Output: I/O last 3 completed shifts: In: 2691.3 [I.V.:590.9; NG/GT:1800; IV Piggyback:300.4] Out: 280 [Urine:280]   Intake/Output this shift:  No intake/output data recorded.  Physical Exam: General: Critically ill  Head: ETT   Eyes: Pinpoint pupils  Neck: trachea midline  Lungs:  PRVC 40%  Heart: irregular  Abdomen:  Soft, not distended  Extremities:  + peripheral edema.  Neurologic: Intubated and sedated  Skin: No lesions  Access: Left IJ temp HD catheter    Basic Metabolic Panel: Recent Labs  Lab 01/05/2021 1604 12/24/20 0626 12/24/20 1620 12/25/20 0351 12/26/20 0537 12/27/20 0531  NA 140 141 140 141 139 140  K 5.5* 5.0 6.3* 4.9 4.6 4.3  CL 101 110 101 104 103 105  CO2 25 19* 24 28 27 27   GLUCOSE 123* 118* 134* 102* 146* 150*  BUN 42* 42* 54* 42* 54* 65*  CREATININE 2.12* 1.84* 2.45* 1.87* 2.28* 2.57*  CALCIUM 10.6* 8.3* 10.2 9.0 8.8* 8.5*  MG 2.2  --   --  1.8 2.1 2.4  PHOS  --   --   --  3.3 2.7 3.2    Liver Function Tests: Recent Labs  Lab 01/11/2021 1604 12/25/20 0351 12/26/20 0537 12/27/20 0531  AST 33 28 30 23   ALT 15 13 12 12   ALKPHOS 88 62 61 55  BILITOT 1.1 0.8 0.6 0.6  PROT 6.8 5.5* 5.2* 4.8*  ALBUMIN 3.8 3.1* 2.9* 2.5*   No results for input(s): LIPASE, AMYLASE in the last  168 hours. Recent Labs  Lab 12/24/20 1201 12/24/20 2355 12/25/20 0351  AMMONIA 58* 33 29    CBC: Recent Labs  Lab 01/03/2021 1604 12/24/20 0626 12/25/20 0351 12/26/20 0537 12/27/20 0531  WBC 23.1* 21.8* 17.6* 15.9* 15.3*  NEUTROABS 20.7*  --  15.9* 13.5* 12.7*  HGB 11.6* 11.0* 9.8* 10.1* 9.5*  HCT 39.4 37.8 32.4* 33.7* 32.2*  MCV 92.9 93.6 90.5 90.8 91.2  PLT 605* 563* 402* 363 333    Cardiac Enzymes: No results for input(s): CKTOTAL, CKMB, CKMBINDEX, TROPONINI in the last 168 hours.  BNP: Invalid input(s): POCBNP  CBG: Recent Labs  Lab 12/24/20 1713 12/24/20 1930 12/24/20 2352 12/25/20 0334 12/25/20 0728  GLUCAP 121* 122* 102* 95 81    Microbiology: Results for orders placed or performed during the hospital encounter of 12/30/2020  SARS CORONAVIRUS 2 (TAT 6-24 HRS) Nasopharyngeal Nasopharyngeal Swab     Status: None   Collection Time: 12/27/2020  4:04 PM   Specimen: Nasopharyngeal Swab  Result Value Ref Range Status   SARS Coronavirus 2 NEGATIVE NEGATIVE Final    Comment: (NOTE) SARS-CoV-2 target nucleic acids are NOT DETECTED.  The SARS-CoV-2 RNA is generally detectable in upper and lower respiratory specimens during the acute phase of infection. Negative results  do not preclude SARS-CoV-2 infection, do not rule out co-infections with other pathogens, and should not be used as the sole basis for treatment or other patient management decisions. Negative results must be combined with clinical observations, patient history, and epidemiological information. The expected result is Negative.  Fact Sheet for Patients: SugarRoll.be  Fact Sheet for Healthcare Providers: https://www.woods-mathews.com/  This test is not yet approved or cleared by the [place] FDA and  has been authorized for detection and/or diagnosis of SARS-CoV-2 by FDA under an Emergency Use Authorization (EUA). This EUA will remain  in effect  (meaning this test can be used) for the duration of the COVID-19 declaration under Se ction 564(b)(1) of the Act, 21 U.S.C. section 360bbb-3(b)(1), unless the authorization is terminated or revoked sooner.  Performed at [facility], [place] [address] [place], [place] [address]   Culture, blood (single)     Status: Abnormal (Preliminary result)   Collection Time: 12/31/2020  4:58 PM   Specimen: BLOOD  Result Value Ref Range Status   Specimen Description   Final    BLOOD RIGHT ANTECUBITAL Performed at [facility], [address] [place], [place] [address]    Special Requests   Final    BOTTLES DRAWN AEROBIC AND ANAEROBIC Blood Culture adequate volume Performed at [facility] [address] [place], [place] [address]    Culture  Setup Time   Final    GRAM POSITIVE COCCI AEROBIC BOTTLE ONLY CRITICAL RESULT CALLED TO, READ BACK BY AND VERIFIED WITH: SUSAN WATSON PHARMD AT 1705 ON 12/24/20 SNG ANAEROBIC BOTTLE ONLY GRAM POSITIVE RODS CRITICAL RESULT CALLED TO, READ BACK BY AND VERIFIED WITH: NATHAN BELUE @2236  ON 12/24/20 SKL    Culture (A)  Final    AEROCOCCUS VIRIDANS Standardized susceptibility testing for this organism is not available. CULTURE REINCUBATED FOR BETTER GROWTH Performed at [facility], [place] [address], [place], [place] [address]    Report Status PENDING  Incomplete  Blood Culture ID Panel (Reflexed)     Status: None   Collection Time: 01/10/2021  4:58 PM  Result Value Ref Range Status   Enterococcus faecalis NOT DETECTED NOT DETECTED Final   Enterococcus Faecium NOT DETECTED NOT DETECTED Final   Listeria monocytogenes NOT DETECTED NOT DETECTED Final   Staphylococcus species NOT DETECTED NOT DETECTED Final   Staphylococcus aureus (BCID) NOT DETECTED NOT DETECTED Final   Staphylococcus epidermidis NOT DETECTED NOT DETECTED Final   Staphylococcus lugdunensis NOT DETECTED NOT DETECTED Final   Streptococcus species NOT DETECTED NOT  DETECTED Final   Streptococcus agalactiae NOT DETECTED NOT DETECTED Final   Streptococcus pneumoniae NOT DETECTED NOT DETECTED Final   Streptococcus pyogenes NOT DETECTED NOT DETECTED Final   A.calcoaceticus-baumannii NOT DETECTED NOT DETECTED Final   Bacteroides fragilis NOT DETECTED NOT DETECTED Final   Enterobacterales NOT DETECTED NOT DETECTED Final   Enterobacter cloacae complex NOT DETECTED NOT DETECTED Final   Escherichia coli NOT DETECTED NOT DETECTED Final   Klebsiella aerogenes NOT DETECTED NOT DETECTED Final   Klebsiella oxytoca NOT DETECTED NOT DETECTED Final   Klebsiella pneumoniae NOT DETECTED NOT DETECTED Final   Proteus species NOT DETECTED NOT DETECTED Final   Salmonella species NOT DETECTED NOT DETECTED Final   Serratia marcescens NOT DETECTED NOT DETECTED Final   Haemophilus influenzae NOT DETECTED NOT DETECTED Final   Neisseria meningitidis NOT DETECTED NOT DETECTED Final   Pseudomonas aeruginosa NOT DETECTED NOT DETECTED Final   Stenotrophomonas maltophilia NOT DETECTED NOT  DETECTED Final   Candida albicans NOT DETECTED NOT DETECTED Final   Candida auris NOT DETECTED NOT DETECTED Final   Candida glabrata NOT DETECTED NOT DETECTED Final   Candida krusei NOT DETECTED NOT DETECTED Final   Candida parapsilosis NOT DETECTED NOT DETECTED Final   Candida tropicalis NOT DETECTED NOT DETECTED Final   Cryptococcus neoformans/gattii NOT DETECTED NOT DETECTED Final    Comment: Performed at [facility], [address] [place], [place] [address]  Body fluid culture     Status: None (Preliminary result)   Collection Time: 12/24/20  2:37 PM   Specimen: PATH Cytology Peritoneal fluid  Result Value Ref Range Status   Specimen Description   Final    PERITONEAL Performed at [facility], [address], [place], [place] [address]    Special Requests   Final    NONE Performed at [facility], [address] [place], [place] [address]    Gram  Stain   Final    RARE WBC PRESENT,BOTH PMN AND MONONUCLEAR NO ORGANISMS SEEN    Culture   Final    NO GROWTH 1 DAY Performed at [facility], [place] [address], [place], [place] [address]    Report Status PENDING  Incomplete  Aerobic/Anaerobic Culture (surgical/deep wound)     Status: None (Preliminary result)   Collection Time: 12/24/20  2:37 PM   Specimen: PATH Cytology Peritoneal fluid  Result Value Ref Range Status   Specimen Description   Final    PERITONEAL Performed at [facility], [address], [place], [place] [address]    Special Requests   Final    NONE Performed at [facility], [address] [place], [place] [address]    Gram Stain   Final    FEW WBC PRESENT, PREDOMINANTLY MONONUCLEAR NO ORGANISMS SEEN    Culture   Final    NO GROWTH 1 DAY Performed at [facility], [place] [address] [place], [place] [address]    Report Status PENDING  Incomplete  MRSA PCR Screening     Status: None   Collection Time: 12/24/20 11:22 PM   Specimen: Nasopharyngeal  Result Value Ref Range Status   MRSA by PCR NEGATIVE NEGATIVE Final    Comment:        The GeneXpert MRSA Assay (FDA approved for NASAL specimens only), is one component of a comprehensive MRSA colonization surveillance program. It is not intended to diagnose MRSA infection nor to guide or monitor treatment for MRSA infections. Performed at [facility], [address] [place], [place] [address]     Coagulation Studies: Recent Labs    12/26/20 0537  LABPROT 17.7*  INR 1.5*    Urinalysis: No results for input(s): COLORURINE, LABSPEC, PHURINE, GLUCOSEU, HGBUR, BILIRUBINUR, KETONESUR, PROTEINUR, UROBILINOGEN, NITRITE, LEUKOCYTESUR in the last 72 hours.  Invalid input(s): APPERANCEUR    Imaging: US RENAL  Result Date: 12/25/2020 CLINICAL DATA:  Acute kidney injury. EXAM: RENAL / URINARY TRACT ULTRASOUND COMPLETE COMPARISON:  CT abdomen pelvis, 01/16/2020  FINDINGS: Right Kidney: Renal measurements: 10.2 x 6.1 x 4.0 cm = volume: 130.8 mL. Echogenicity within normal limits. No mass or hydronephrosis visualized. Left Kidney: Renal measurements: 11.2 x 6.3 x 7.1 cm = volume: 263.0 mL. Echogenicity within normal limits. No mass or hydronephrosis visualized. Bladder: Not visualized.  Decompressed with a Foley catheter. Other: Small amount of ascites. IMPRESSION: 1. Somewhat limited exam due to body habitus. Kidneys appear within normal limits. No  hydronephrosis. 2. Bladder not visualized. 3. Small amount of ascites Electronically Signed   By: Lajean Manes M.D.   On: 12/25/2020 18:41   DG Chest Port 1 View  Result Date: 12/26/2020 CLINICAL DATA:  Acute respiratory failure. EXAM: PORTABLE CHEST 1 VIEW COMPARISON:  Chest x-ray dated December 24, 2020. FINDINGS: Unchanged endotracheal and enteric tubes. Unchanged left internal central venous catheter. Unchanged mild cardiomegaly. Slightly improved diffuse interstitial thickening. Unchanged layering bilateral pleural effusions and bibasilar atelectasis. No pneumothorax. No acute osseous abnormality. IMPRESSION: 1. Slightly improved pulmonary edema. 2. Unchanged layering bilateral pleural effusions and bibasilar atelectasis. Electronically Signed   By: Titus Dubin M.D.   On: 12/26/2020 07:19     Medications:   . sodium chloride 5 mL/hr at 12/27/20 0700  . albumin human    . ceFEPime (MAXIPIME) IV Stopped (12/26/20 1349)  . famotidine (PEPCID) IV Stopped (12/26/20 2128)  . feeding supplement (VITAL AF 1.2 CAL) 1,000 mL (12/27/20 5784)  . fentaNYL infusion INTRAVENOUS 100 mcg/hr (12/27/20 0700)  . levETIRAcetam Stopped (12/27/20 0042)  . norepinephrine (LEVOPHED) Adult infusion Stopped (12/24/20 1929)  . vancomycin     . atorvastatin  40 mg Per Tube Daily  . chlorhexidine gluconate (MEDLINE KIT)  15 mL Mouth Rinse BID  . Chlorhexidine Gluconate Cloth  6 each Topical Daily  . docusate  100 mg Per Tube  BID  . etomidate  20 mg Intravenous Once  . feeding supplement (PROSource TF)  45 mL Per Tube TID  . fluticasone  2 spray Each Nare Daily  . heparin  5,000 Units Subcutaneous Q8H  . lactulose  20 g Per Tube BID  . mouth rinse  15 mL Mouth Rinse 10 times per day  . montelukast  10 mg Per Tube QHS  . multivitamin  15 mL Per Tube Daily  . nystatin cream   Topical BID  . polyethylene glycol  17 g Per Tube Daily  . sitaGLIPtin  25 mg Per Tube Daily  . umeclidinium-vilanterol  1 puff Inhalation Daily   sodium chloride, albumin human, fentaNYL, ipratropium-albuterol, metoprolol tartrate, midazolam, ondansetron **OR** ondansetron (ZOFRAN) IV  Assessment/ Plan:  Ms. JAQUELYNN WANAMAKER is a 80 y.o. white female with sleep apnea, seizure disorder, atrial fibrillation, congestive heart failure, hypertension, hyperlipidemia, depression, diabetes mellitus type II, COPD, polycythemia who is admitted to [facility] on 04/22/6294 for Metabolic encephalopathy [M84.13] Abdominal distension [R14.0] Hypoxia [R09.02] Other ascites [R18.8] Acute on chronic diastolic congestive heart failure ([place]) [I50.33] AKI (acute kidney injury) ([place]) [N17.9] COPD with acute exacerbation ([place]) [J44.1] Sepsis ([place]) [A41.9] Sepsis with acute hypoxic respiratory failure without septic shock, due to unspecified organism ([place]) [A41.9, R65.20, J96.01]  1. 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 Baseline creatinine of 1.03, GFR of 56 on 10/18/2020.  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  - Will give high dose IV furosemide today to monitor response. If no improvement, will offer renal replacement therapy.   2. Acute respiratory failure:  requiring intubation and mechanical ventilation. Secondary to pneumonia. On cefepime and vancomycin.   3. Hypotension: with sepsis: requiring vasopressors: norepinephrine. Empiric antibiotics as above: cefepime and vancomycin Blood cultures positive for Aerococcus viridans Not enough PMNs in peritoneal fluid to diagnose with spontaneous bacterial peritonitis.   4. Anemia with renal  failure: with history of polycythemia. Hemoglobin trending down    LOS: 4 Arissa Fagin 2/13/20228:12 AM

## 2020-12-27 NOTE — Progress Notes (Signed)
Pharmacy Antibiotic Note  Angelica Juarez is a 80 y.o. female admitted on 12/26/2020 with respiratory failure secondary to pneumonia, multiorgan failure requiring intubation. Blood cultures with GPC in 1 bottle and GPR in 1 bottle, likely contaminants. On ceftriaxone and azithromycin which were discontinued. Pharmacy has been consulted for vancomycin and cefepime dosing.  -2/9 BCx: GPC in 1 bottle and GPR in 1 bottle: Aerococcus Viridans -nephrology assessing need for HD daily  Plan: -Day 3- Scr worse 1.87> 2.28 > 2.57  *Renal fxn unstable- will check a random level and dose from that*  Per nephrology note today 2/13: Will give high dose IV furosemide today to monitor response. If no improvement, will offer renal replacement therapy.    (AUC Calculated dose: Vancomycin 1 gm IV q48h. AUC 488  Cmin 12.6   Scr 2.57)  Follow up renal function with morning labs and Nephrology's plan for possible HD or CRRT.  -Cefepime 2 g IV q24h based on Crcl 23 ml/min    Height: 5\' 7"  (170.2 cm) Weight: 106.6 kg (235 lb 0.2 oz) IBW/kg (Calculated) : 61.6  Temp (24hrs), Avg:98.5 F (36.9 C), Min:97.7 F (36.5 C), Max:99.3 F (37.4 C)  Recent Labs  Lab 12/19/2020 1604 12/24/20 0626 12/24/20 1620 12/25/20 0351 12/26/20 0537 12/27/20 0531  WBC 23.1* 21.8*  --  17.6* 15.9* 15.3*  CREATININE 2.12* 1.84* 2.45* 1.87* 2.28* 2.57*  LATICACIDVEN 1.8  --   --   --   --   --     Estimated Creatinine Clearance: 23 mL/min (A) (by C-G formula based on SCr of 2.57 mg/dL (H)).    Allergies  Allergen Reactions  . Iodinated Diagnostic Agents Anaphylaxis    Other reaction(s): Other (See Comments) Throat swells and extreme hives  . Lamictal [Lamotrigine] Rash    Suspected drug rash 09/2020  . Latex Itching  . Phenobarbital Hives  . Tape Rash    silicones    Antimicrobials this admission: Ceftriaxone 2/9 >> 2/11 Azithromycin 2/9 >> 2/11 Vancomycin 2/11 >> Cefepime 2/11 >>   Microbiology results: 2/9 BCx:  1/4 bottles GPC, 1/4 bottles GPR: aerococcus viridans 2/10 Peritoneal fluid: pending 2/10 MRSA PCR: negative  Thank you for allowing pharmacy to be a part of this patient's care.  Tywanda Rice A, PharmD 12/27/2020 12:37 PM

## 2020-12-27 NOTE — Progress Notes (Signed)
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 fib not on anticoagulation due to history of retroperitoneal bleed, HFpEF, hypertension, cirrhosis  Admitted with acute respiratory failure secondary to pneumonia, multiorgan failure with AKI, liver failure, hepatorenal syndrome  Significant Diagnostic Tests:  CT head 2/9-no acute findings.  Echocardiogram 2/10-LVEF 60-65%, normal RV systolic function, mild enlargement of RV size  Micro Data:  Blood culture 2/9-GPC, GPR AEROCOCCUS VIRIDANS  Antimicrobials:  Ceftriaxone 2/9-2/11 Azithromycin 2/9-2/11  Vanco 2/11>> Cefepime 2/11>>    Significant Hospital Events:   2/9Admitted for Pneumonia 2/10 US paracentesis 2L yellow fluid removed 2/10 emergently intubatedafter shefailedBiPAP, HD catheter placed and dialyzed 2/11 remains intubated, multiorgan failure 2/12 remains on vent, failure to wean, +encephlopathy       CC  follow up respiratory failure  SUBJECTIVE Patient remains critically ill Prognosis is guarded multiorgan failure    Intake/Output Summary (Last 24 hours) at 12/27/2020 0908 Last data filed at 12/27/2020 0906 Gross per 24 hour  Intake 1980.77 ml  Output 240 ml  Net 1740.77 ml    Vent Mode: PRVC FiO2 (%):  [30 %-50 %] 30 % Set Rate:  [16 bmp] 16 bmp Vt Set:  [500 mL] 500 mL PEEP:  [5 cmH20] 5 cmH20 Pressure Support:  [5 cmH20] 5 cmH20  CBC    Component Value Date/Time   WBC 15.3 (H) 12/27/2020 0531   RBC 3.53 (L) 12/27/2020 0531   HGB 9.5 (L) 12/27/2020 0531   HGB 13.3 02/20/2014 2351   HCT 32.2 (L) 12/27/2020 0531   HCT 41.0 02/20/2014 2351   PLT 333 12/27/2020 0531   PLT 397 02/20/2014 2351   MCV 91.2 12/27/2020 0531   MCV 96 02/20/2014 2351   MCH 26.9 12/27/2020 0531   MCHC 29.5 (L) 12/27/2020 0531   RDW 17.1 (H) 12/27/2020 0531   RDW 13.4 02/20/2014 2351   LYMPHSABS 0.3 (L) 12/27/2020 0531   LYMPHSABS 0.9 (L)  09/24/2012 1309   MONOABS 1.4 (H) 12/27/2020 0531   MONOABS 0.5 09/24/2012 1309   EOSABS 0.3 12/27/2020 0531   EOSABS 0.5 09/24/2012 1309   BASOSABS 0.1 12/27/2020 0531   BASOSABS 1 09/25/2012 0425   BASOSABS 0.1 09/24/2012 1309   BMP Latest Ref Rng & Units 12/27/2020 12/26/2020 12/25/2020  Glucose 70 - 99 mg/dL 150(H) 146(H) 102(H)  BUN 8 - 23 mg/dL 65(H) 54(H) 42(H)  Creatinine 0.44 - 1.00 mg/dL 2.57(H) 2.28(H) 1.87(H)  Sodium 135 - 145 mmol/L 140 139 141  Potassium 3.5 - 5.1 mmol/L 4.3 4.6 4.9  Chloride 98 - 111 mmol/L 105 103 104  CO2 22 - 32 mmol/L 27 27 28   Calcium 8.9 - 10.3 mg/dL 8.5(L) 8.8(L) 9.0     BP (!) 95/35 (BP Location: Left Arm)   Pulse 90   Temp 99.3 F (37.4 C) (Oral)   Resp 16   Ht 5' 7"  (1.702 m)   Wt 106.6 kg   SpO2 95%   BMI 36.81 kg/m    I/O last 3 completed shifts: In: 2691.3 [I.V.:590.9; NG/GT:1800; IV Piggyback:300.4] Out: 280 [Urine:280] Total I/O In: 65 [I.V.:15; NG/GT:50] Out: 30 [Urine:30]  SpO2: 95 % O2 Flow Rate (L/min): 15 L/min FiO2 (%): 30 %  Estimated body mass index is 36.81 kg/m as calculated from the following:   Height as of this encounter: 5' 7"  (1.702 m).   Weight as of this encounter: 106.6 kg.  SIGNIFICANT EVENTS   REVIEW OF SYSTEMS  PATIENT IS  UNABLE TO PROVIDE COMPLETE REVIEW OF SYSTEMS DUE TO SEVERE CRITICAL ILLNESS    PHYSICAL EXAMINATION:  GENERAL:critically ill appearing, +resp distress EYES: Pupils equal, round, reactive to light.  No scleral icterus.  MOUTH: Moist mucosal membrane. NECK: Supple.  PULMONARY: +rhonchi, CARDIOVASCULAR: S1 and S2. Regular rate and rhythm. No murmurs, rubs, or gallops.  GASTROINTESTINAL: Soft, nontender, -distended.  Positive bowel sounds.   MUSCULOSKELETAL: No swelling, clubbing, or edema.  NEUROLOGIC: obtunded, GCS<8 SKIN:intact,warm,dry  MEDICATIONS: I have reviewed all medications and confirmed regimen as documented   CULTURE RESULTS   Recent Results (from the  past 240 hour(s))  SARS CORONAVIRUS 2 (TAT 6-24 HRS) Nasopharyngeal Nasopharyngeal Swab     Status: None   Collection Time: 01/08/2021  4:04 PM   Specimen: Nasopharyngeal Swab  Result Value Ref Range Status   SARS Coronavirus 2 NEGATIVE NEGATIVE Final    Comment: (NOTE) SARS-CoV-2 target nucleic acids are NOT DETECTED.  The SARS-CoV-2 RNA is generally detectable in upper and lower respiratory specimens during the acute phase of infection. Negative results do not preclude SARS-CoV-2 infection, do not rule out co-infections with other pathogens, and should not be used as the sole basis for treatment or other patient management decisions. Negative results must be combined with clinical observations, patient history, and epidemiological information. The expected result is Negative.  Fact Sheet for Patients: SugarRoll.be  Fact Sheet for Healthcare Providers: https://www.woods-mathews.com/  This test is not yet approved or cleared by the [place] FDA and  has been authorized for detection and/or diagnosis of SARS-CoV-2 by FDA under an Emergency Use Authorization (EUA). This EUA will remain  in effect (meaning this test can be used) for the duration of the COVID-19 declaration under Se ction 564(b)(1) of the Act, 21 U.S.C. section 360bbb-3(b)(1), unless the authorization is terminated or revoked sooner.  Performed at [facility], [place] [address] [place], [place] [address]   Culture, blood (single)     Status: Abnormal (Preliminary result)   Collection Time: 01/03/2021  4:58 PM   Specimen: BLOOD  Result Value Ref Range Status   Specimen Description   Final    BLOOD RIGHT ANTECUBITAL Performed at [facility], [address] [place], [place] [address]    Special Requests   Final    BOTTLES DRAWN AEROBIC AND ANAEROBIC Blood Culture adequate volume Performed at [facility], [address] [place], [place]  [address]    Culture  Setup Time   Final    GRAM POSITIVE COCCI AEROBIC BOTTLE ONLY CRITICAL RESULT CALLED TO, READ BACK BY AND VERIFIED WITH: SUSAN WATSON PHARMD AT 1705 ON 12/24/20 SNG ANAEROBIC BOTTLE ONLY GRAM POSITIVE RODS CRITICAL RESULT CALLED TO, READ BACK BY AND VERIFIED WITH: NATHAN BELUE @2236  ON 12/24/20 SKL    Culture (A)  Final    AEROCOCCUS VIRIDANS Standardized susceptibility testing for this organism is not available. CULTURE REINCUBATED FOR BETTER GROWTH Performed at [facility], [place] [address], [place],  [address]    Report Status PENDING  Incomplete  Blood Culture ID Panel (Reflexed)     Status: None   Collection Time: 12/16/2020  4:58 PM  Result Value Ref Range Status   Enterococcus faecalis NOT DETECTED NOT DETECTED Final   Enterococcus Faecium NOT DETECTED NOT DETECTED Final   Listeria monocytogenes NOT DETECTED NOT DETECTED Final   Staphylococcus species NOT DETECTED NOT DETECTED Final   Staphylococcus aureus (BCID) NOT DETECTED NOT DETECTED Final   Staphylococcus epidermidis NOT DETECTED  NOT DETECTED Final   Staphylococcus lugdunensis NOT DETECTED NOT DETECTED Final   Streptococcus species NOT DETECTED NOT DETECTED Final   Streptococcus agalactiae NOT DETECTED NOT DETECTED Final   Streptococcus pneumoniae NOT DETECTED NOT DETECTED Final   Streptococcus pyogenes NOT DETECTED NOT DETECTED Final   A.calcoaceticus-baumannii NOT DETECTED NOT DETECTED Final   Bacteroides fragilis NOT DETECTED NOT DETECTED Final   Enterobacterales NOT DETECTED NOT DETECTED Final   Enterobacter cloacae complex NOT DETECTED NOT DETECTED Final   Escherichia coli NOT DETECTED NOT DETECTED Final   Klebsiella aerogenes NOT DETECTED NOT DETECTED Final   Klebsiella oxytoca NOT DETECTED NOT DETECTED Final   Klebsiella pneumoniae NOT DETECTED NOT DETECTED Final   Proteus species NOT DETECTED NOT DETECTED Final   Salmonella species NOT DETECTED NOT DETECTED Final   Serratia  marcescens NOT DETECTED NOT DETECTED Final   Haemophilus influenzae NOT DETECTED NOT DETECTED Final   Neisseria meningitidis NOT DETECTED NOT DETECTED Final   Pseudomonas aeruginosa NOT DETECTED NOT DETECTED Final   Stenotrophomonas maltophilia NOT DETECTED NOT DETECTED Final   Candida albicans NOT DETECTED NOT DETECTED Final   Candida auris NOT DETECTED NOT DETECTED Final   Candida glabrata NOT DETECTED NOT DETECTED Final   Candida krusei NOT DETECTED NOT DETECTED Final   Candida parapsilosis NOT DETECTED NOT DETECTED Final   Candida tropicalis NOT DETECTED NOT DETECTED Final   Cryptococcus neoformans/gattii NOT DETECTED NOT DETECTED Final    Comment: Performed at [facility], [address] [place], [place] [address]  Body fluid culture     Status: None (Preliminary result)   Collection Time: 12/24/20  2:37 PM   Specimen: PATH Cytology Peritoneal fluid  Result Value Ref Range Status   Specimen Description   Final    PERITONEAL Performed at [facility], [address] [place], [place] [address]    Special Requests   Final    NONE Performed at [facility], [place], [place] [address]    Gram Stain   Final    RARE WBC PRESENT,BOTH PMN AND MONONUCLEAR NO ORGANISMS SEEN    Culture   Final    NO GROWTH 1 DAY Performed at [facility], [address] [place], [place] [address]    Report Status PENDING  Incomplete  Aerobic/Anaerobic Culture (surgical/deep wound)     Status: None (Preliminary result)   Collection Time: 12/24/20  2:37 PM   Specimen: PATH Cytology Peritoneal fluid  Result Value Ref Range Status   Specimen Description   Final    PERITONEAL Performed at [facility], [address], [place], [place] [address]    Special Requests   Final    NONE Performed at [facility], [address] [place], [place] [address]    Gram Stain   Final    FEW WBC PRESENT, PREDOMINANTLY MONONUCLEAR NO  ORGANISMS SEEN    Culture   Final    NO GROWTH 1 DAY Performed at [facility], [place] [address] [place],  [address]    Report Status PENDING  Incomplete  MRSA PCR Screening     Status: None   Collection Time: 12/24/20 11:22 PM   Specimen: Nasopharyngeal  Result Value Ref Range Status   MRSA by PCR NEGATIVE NEGATIVE Final    Comment:        The GeneXpert MRSA Assay (FDA approved for NASAL specimens only), is one component of a comprehensive MRSA colonization surveillance program. It  is not intended to diagnose MRSA infection nor to guide or monitor treatment for MRSA infections. Performed at [facility], [address], [place], [place] [address]           IMAGING    No results found.   Nutrition Status: Nutrition Problem: Inadequate oral intake Etiology: inability to eat (pt sedated and ventilated) Signs/Symptoms: NPO status       Indwelling Urinary Catheter continued, requirement due to   Reason to continue Indwelling Urinary Catheter strict Intake/Output monitoring for hemodynamic instability   Central Line/ continued, requirement due to  Reason to continue [place] of central venous pressure or other hemodynamic parameters and poor IV access   Ventilator continued, requirement due to severe respiratory failure   Ventilator Sedation RASS 0 to -2      ASSESSMENT AND PLAN SYNOPSIS 80 yo morbidly obese white female with acute decompensation of liver cirrhosis with progressive renal failure leading to severe hypoxic resp failure with pulm edema, with AEROCOCCUS VIRIDANS BACTEREMIA  Severe ACUTE Hypoxic and Hypercapnic Respiratory Failure -continue Full MV support -continue Bronchodilator Therapy -Wean Fio2 and PEEP as tolerated -will perform SAT/SBT when respiratory parameters are met -VAP/VENT bundle implementation  Morbid obesity, possible OSA.   Will certainly impact respiratory mechanics, ventilator  weaning Suspect will need to consider additional PEEP   ACUTE KIDNEY INJURY/Renal Failure -continue Foley Catheter-assess need -Avoid nephrotoxic agents -Follow urine output, BMP -Ensure adequate renal perfusion, optimize oxygenation -Renal dose medications Follow up nephrology recs HD as needed     NEUROLOGY Acute toxic metabolic encephalopathy due to hypoxia and hepatic encephalopathy Lactulose as prescribed Wake up assessment pending   CARDIAC ICU monitoring  INFECTIOUS DISEASE -continue antibiotics as prescribed -follow up cultures Need to re-assess ABX coverage discuss with ID pharmacy     GI GI PROPHYLAXIS as indicated  DIET-->TF's as tolerated Constipation protocol as indicated  ENDO - will use ICU hypoglycemic\Hyperglycemia protocol if indicated     ELECTROLYTES -follow labs as needed -replace as needed -pharmacy consultation and following   DVT/GI PRX ordered and assessed TRANSFUSIONS AS NEEDED MONITOR FSBS I Assessed the need for Labs I Assessed the need for Foley I Assessed the need for Central Venous Line Family Discussion when available I Assessed the need for Mobilization I made an Assessment of medications to be adjusted accordingly Safety Risk assessment completed   CASE DISCUSSED IN MULTIDISCIPLINARY ROUNDS WITH ICU TEAM  Critical Care Time devoted to patient care services described in this note is 45 minutes.   Overall, patient is critically ill, prognosis is guarded.  Patient with Multiorgan failure and at high risk for cardiac arrest and death.   Patient is now DNR status.   Corrin Parker, M.D.  [facility]  Medical Director [place] Director [facility]io[facility] Department

## 2020-12-27 NOTE — Progress Notes (Signed)
Pharmacy Antibiotic Note  Angelica Juarez is a 80 y.o. female admitted on 12/27/2020 with respiratory failure secondary to pneumonia, multiorgan failure requiring intubation. Blood cultures with GPC in 1 bottle and GPR in 1 bottle, likely contaminants. On ceftriaxone and azithromycin which were discontinued. Pharmacy has been consulted for vancomycin and cefepime dosing.  - 2/9 BCx: GPC in 1 bottle and GPR in 1 bottle: Aerococcus Viridans - nephrology assessing need for HD daily - 2/13 vanc random: 14  Plan: - Day 3 - Scr worse 1.87> 2.28 > 2.57 - Vancomycin 500 mg IV every 24 hours. Goal AUC 400-550. Expected AUC: 488 SCr used: 2.57  Per nephrology note today 2/13: Will give high dose IV furosemide today to monitor response. If no improvement, will offer renal replacement therapy. Follow up renal function with morning labs and Nephrology's plan for possible HD or CRRT.  -Continue cefepime 2 g IV q24h based on Crcl 23 ml/min Monitor clinical picture and renal function F/U C&S, abx deescalation / LOT    Height: 5\' 7"  (170.2 cm) Weight: 106.6 kg (235 lb 0.2 oz) IBW/kg (Calculated) : 61.6  Temp (24hrs), Avg:98.4 F (36.9 C), Min:97.7 F (36.5 C), Max:99.3 F (37.4 C)  Recent Labs  Lab 12/15/2020 1604 12/24/20 0626 12/24/20 1620 12/25/20 0351 12/26/20 0537 12/27/20 0531 12/27/20 1600  WBC 23.1* 21.8*  --  17.6* 15.9* 15.3*  --   CREATININE 2.12* 1.84* 2.45* 1.87* 2.28* 2.57*  --   LATICACIDVEN 1.8  --   --   --   --   --   --   VANCORANDOM  --   --   --   --   --   --  14    Estimated Creatinine Clearance: 23 mL/min (A) (by C-G formula based on SCr of 2.57 mg/dL (H)).    Allergies  Allergen Reactions  . Iodinated Diagnostic Agents Anaphylaxis    Other reaction(s): Other (See Comments) Throat swells and extreme hives  . Lamictal [Lamotrigine] Rash    Suspected drug rash 09/2020  . Latex Itching  . Phenobarbital Hives  . Tape Rash    silicones    Antimicrobials this  admission: Ceftriaxone 2/9 >> 2/11 Azithromycin 2/9 >> 2/11 Vancomycin 2/11 >> Cefepime 2/11 >>   Microbiology results: 2/9 BCx: 1/4 bottles GPC, 1/4 bottles GPR: aerococcus viridans 2/10 Peritoneal fluid: pending 2/10 MRSA PCR: negative  Thank you for allowing pharmacy to be a part of this patient's care.  Darnelle Bos, PharmD 12/27/2020 5:59 PM

## 2020-12-28 DIAGNOSIS — Z515 Encounter for palliative care: Secondary | ICD-10-CM | POA: Diagnosis not present

## 2020-12-28 DIAGNOSIS — A419 Sepsis, unspecified organism: Secondary | ICD-10-CM | POA: Diagnosis not present

## 2020-12-28 DIAGNOSIS — I5033 Acute on chronic diastolic (congestive) heart failure: Secondary | ICD-10-CM | POA: Diagnosis not present

## 2020-12-28 DIAGNOSIS — G9341 Metabolic encephalopathy: Secondary | ICD-10-CM | POA: Diagnosis not present

## 2020-12-28 DIAGNOSIS — R6520 Severe sepsis without septic shock: Secondary | ICD-10-CM | POA: Diagnosis not present

## 2020-12-28 DIAGNOSIS — Z7189 Other specified counseling: Secondary | ICD-10-CM | POA: Diagnosis not present

## 2020-12-28 DIAGNOSIS — J9621 Acute and chronic respiratory failure with hypoxia: Secondary | ICD-10-CM | POA: Diagnosis not present

## 2020-12-28 LAB — CBC WITH DIFFERENTIAL/PLATELET
Abs Immature Granulocytes: 1.15 10*3/uL — ABNORMAL HIGH (ref 0.00–0.07)
Abs Immature Granulocytes: 1.14 10*3/uL — ABNORMAL HIGH (ref 0.00–0.07)
Basophils Absolute: 0.3 10*3/uL — ABNORMAL HIGH (ref 0.0–0.1)
Basophils Absolute: 0.2 10*3/uL — ABNORMAL HIGH (ref 0.0–0.1)
Basophils Relative: 1 %
Basophils Relative: 1 %
Eosinophils Absolute: 0.5 10*3/uL (ref 0.0–0.5)
Eosinophils Absolute: 0.5 10*3/uL (ref 0.0–0.5)
Eosinophils Relative: 2 %
Eosinophils Relative: 2 %
HCT: 34.6 % — ABNORMAL LOW (ref 36.0–46.0)
HCT: 36.2 % (ref 36.0–46.0)
Hemoglobin: 10.4 g/dL — ABNORMAL LOW (ref 12.0–15.0)
Hemoglobin: 10.7 g/dL — ABNORMAL LOW (ref 12.0–15.0)
Immature Granulocytes: 5 %
Immature Granulocytes: 5 %
Lymphocytes Relative: 2 %
Lymphocytes Relative: 2 %
Lymphs Abs: 0.4 10*3/uL — ABNORMAL LOW (ref 0.7–4.0)
Lymphs Abs: 0.5 10*3/uL — ABNORMAL LOW (ref 0.7–4.0)
MCH: 27.2 pg (ref 26.0–34.0)
MCH: 27 pg (ref 26.0–34.0)
MCHC: 30.1 g/dL (ref 30.0–36.0)
MCHC: 29.6 g/dL — ABNORMAL LOW (ref 30.0–36.0)
MCV: 90.6 fL (ref 80.0–100.0)
MCV: 91.4 fL (ref 80.0–100.0)
Monocytes Absolute: 2.4 10*3/uL — ABNORMAL HIGH (ref 0.1–1.0)
Monocytes Absolute: 2.3 10*3/uL — ABNORMAL HIGH (ref 0.1–1.0)
Monocytes Relative: 10 %
Monocytes Relative: 9 %
Neutro Abs: 20.3 10*3/uL — ABNORMAL HIGH (ref 1.7–7.7)
Neutro Abs: 19.7 10*3/uL — ABNORMAL HIGH (ref 1.7–7.7)
Neutrophils Relative %: 80 %
Neutrophils Relative %: 81 %
Platelets: 433 10*3/uL — ABNORMAL HIGH (ref 150–400)
Platelets: 434 10*3/uL — ABNORMAL HIGH (ref 150–400)
RBC: 3.82 MIL/uL — ABNORMAL LOW (ref 3.87–5.11)
RBC: 3.96 MIL/uL (ref 3.87–5.11)
RDW: 17.3 % — ABNORMAL HIGH (ref 11.5–15.5)
RDW: 17.3 % — ABNORMAL HIGH (ref 11.5–15.5)
WBC: 24.9 10*3/uL — ABNORMAL HIGH (ref 4.0–10.5)
WBC: 24.3 10*3/uL — ABNORMAL HIGH (ref 4.0–10.5)
nRBC: 0.2 % (ref 0.0–0.2)
nRBC: 0.2 % (ref 0.0–0.2)

## 2020-12-28 LAB — PROTEIN ELECTROPHORESIS, SERUM
A/G Ratio: 1.5 (ref 0.7–1.7)
Albumin ELP: 3 g/dL (ref 2.9–4.4)
Alpha-1-Globulin: 0.2 g/dL (ref 0.0–0.4)
Alpha-2-Globulin: 0.5 g/dL (ref 0.4–1.0)
Beta Globulin: 0.6 g/dL — ABNORMAL LOW (ref 0.7–1.3)
Gamma Globulin: 0.6 g/dL (ref 0.4–1.8)
Globulin, Total: 2 g/dL — ABNORMAL LOW (ref 2.2–3.9)
Total Protein ELP: 5 g/dL — ABNORMAL LOW (ref 6.0–8.5)

## 2020-12-28 LAB — COMPREHENSIVE METABOLIC PANEL
ALT: 14 U/L (ref 0–44)
AST: 23 U/L (ref 15–41)
Albumin: 2.7 g/dL — ABNORMAL LOW (ref 3.5–5.0)
Alkaline Phosphatase: 63 U/L (ref 38–126)
Anion gap: 7 (ref 5–15)
BUN: 72 mg/dL — ABNORMAL HIGH (ref 8–23)
CO2: 25 mmol/L (ref 22–32)
Calcium: 8.6 mg/dL — ABNORMAL LOW (ref 8.9–10.3)
Chloride: 107 mmol/L (ref 98–111)
Creatinine, Ser: 2.39 mg/dL — ABNORMAL HIGH (ref 0.44–1.00)
GFR, Estimated: 20 mL/min — ABNORMAL LOW (ref >60)
Glucose, Bld: 191 mg/dL — ABNORMAL HIGH (ref 70–99)
Potassium: 4.2 mmol/L (ref 3.5–5.1)
Sodium: 139 mmol/L (ref 135–145)
Total Bilirubin: 0.7 mg/dL (ref 0.3–1.2)
Total Protein: 5.4 g/dL — ABNORMAL LOW (ref 6.5–8.1)

## 2020-12-28 LAB — GLUCOSE, CAPILLARY
Glucose-Capillary: 139 mg/dL — ABNORMAL HIGH (ref 70–99)
Glucose-Capillary: 149 mg/dL — ABNORMAL HIGH (ref 70–99)
Glucose-Capillary: 150 mg/dL — ABNORMAL HIGH (ref 70–99)
Glucose-Capillary: 158 mg/dL — ABNORMAL HIGH (ref 70–99)
Glucose-Capillary: 162 mg/dL — ABNORMAL HIGH (ref 70–99)
Glucose-Capillary: 177 mg/dL — ABNORMAL HIGH (ref 70–99)

## 2020-12-28 LAB — BODY FLUID CULTURE: Culture: NO GROWTH

## 2020-12-28 LAB — CYTOLOGY - NON PAP

## 2020-12-28 LAB — CULTURE, BLOOD (SINGLE): Special Requests: ADEQUATE

## 2020-12-28 LAB — MAGNESIUM: Magnesium: 2.5 mg/dL — ABNORMAL HIGH (ref 1.7–2.4)

## 2020-12-28 LAB — PHOSPHORUS: Phosphorus: 3.3 mg/dL (ref 2.5–4.6)

## 2020-12-28 MED ORDER — SODIUM CHLORIDE 0.9 % IV SOLN
3.0000 g | Freq: Two times a day (BID) | INTRAVENOUS | Status: DC
  Administered 2020-12-28 (×2): 3 g via INTRAVENOUS
  Filled 2020-12-28 (×2): qty 3
  Filled 2020-12-28 (×2): qty 8

## 2020-12-28 MED ORDER — RIFAXIMIN 200 MG PO TABS
200.0000 mg | ORAL_TABLET | Freq: Two times a day (BID) | ORAL | Status: DC
  Administered 2020-12-28: 200 mg via ORAL
  Filled 2020-12-28 (×2): qty 1

## 2020-12-28 MED ORDER — DEXMEDETOMIDINE HCL IN NACL 400 MCG/100ML IV SOLN
0.4000 ug/kg/h | INTRAVENOUS | Status: DC
  Administered 2020-12-28: 0.3 ug/kg/h via INTRAVENOUS
  Administered 2020-12-28: 0.6 ug/kg/h via INTRAVENOUS
  Administered 2020-12-29: 0.3 ug/kg/h via INTRAVENOUS
  Filled 2020-12-28 (×3): qty 100

## 2020-12-28 MED ORDER — MIDODRINE HCL 5 MG PO TABS
5.0000 mg | ORAL_TABLET | Freq: Three times a day (TID) | ORAL | Status: DC
  Administered 2020-12-28 – 2020-12-29 (×2): 5 mg
  Filled 2020-12-28: qty 1

## 2020-12-28 MED ORDER — FENTANYL CITRATE (PF) 100 MCG/2ML IJ SOLN: 25.0000 ug | INTRAMUSCULAR | Status: DC | PRN

## 2020-12-28 MED ORDER — RIFAXIMIN 200 MG PO TABS
200.0000 mg | ORAL_TABLET | Freq: Two times a day (BID) | ORAL | Status: DC
  Administered 2020-12-28 – 2020-12-29 (×2): 200 mg
  Filled 2020-12-28 (×3): qty 1

## 2020-12-28 MED ORDER — MIDODRINE HCL 5 MG PO TABS: 5.0000 mg | ORAL_TABLET | Freq: Three times a day (TID) | ORAL | Status: DC

## 2020-12-28 MED ORDER — RENA-VITE PO TABS
1.0000 | ORAL_TABLET | Freq: Every day | ORAL | Status: DC
  Administered 2020-12-28: 1
  Filled 2020-12-28: qty 1

## 2020-12-28 NOTE — Progress Notes (Signed)
VAST consulted to obtain IV access. Patient with bilateral upper extremity pitting edema, R greater than left. Left upper extremity assessed utilizing ultrasound. Placed a 20 g 1.88 inch angiocath as charted in IV flowsheets. Arm marked with purple marker to allow for consistent measuring with tape measure to assess for possible infiltration; instructed nurse to measure arm with top of tape measure touching purple line. Beginning circumference 31.5 cms. ICU nurse verbalized understanding and verbalized importance of assessing IV site frequently.

## 2020-12-28 NOTE — Progress Notes (Addendum)
Daily Progress Note   Patient Name: ZALEAH TERNES       Date: 12/28/2020 DOB: Aug 11, 1941  Age: 80 y.o. MRN#: [id number] Attending Physician: Flora Lipps, MD Primary Care Physician: Sofie Hartigan, MD Admit Date: 01/06/2021  Reason for Consultation/Follow-up: Establishing goals of care  Subjective: Patient is resting in bed on the ventilator.  Received call from nephrology that they are discussing starting CRRT for patient.  Patient is on blood pressure support.  Looked in chart to find H POA papers that were to be brought by significant other.  Papers noted in chart however they are H POA papers for the partner and not for the patient.  Attempted to call him however his voicemail box has not been set up.    Called to speak to patient's daughter Beverlee Nims.  Daughter states that Collins Scotland and Claryssa did H POA papers and [facility] together.  She states Collins Scotland is in fact the patient's primary H POA, and she is the secondary in the case that Collins Scotland is unable or unwilling.  She states that her mother has been declining over the past year.  She states that she has discussed with her mother moving into assisted living, however her mother would not do so and wanted to stay with Collins Scotland in their home.  She states that she knows that her mother would want a peaceful death.  She is aware that her mother would not want to go to a nursing facility.  We discussed her current care and current status, and that the current care she is receiving does not sound as though it is care that she would want.  We discussed quality of life past present and future.  She states that she lives in [place] but will get a flight here within the next 24 hours.  She understands her mother's frailty.  Discussed continuing current treatment  so that she can be here with her mother in any decision moving forward.  Discussed holding off on CRRT if it is possible until she is able to come and meet with [place].  She will try to reach [place] to come in for a meeting as well.   Length of Stay: 5  Current Medications: Scheduled Meds:  . atorvastatin  40 mg Per Tube Daily  . chlorhexidine gluconate (MEDLINE KIT)  15 mL Mouth Rinse BID  . Chlorhexidine Gluconate Cloth  6 each Topical Daily  . docusate  100 mg Per Tube BID  . feeding supplement (PROSource TF)  45 mL Per Tube TID  . fluticasone  2 spray Each Nare Daily  . heparin  5,000 Units Subcutaneous Q8H  . lactulose  20 g Per Tube BID  . mouth rinse  15 mL Mouth Rinse 10 times per day  . midodrine  5 mg Per Tube TID WC  . multivitamin  15 mL Per Tube Daily  . nystatin cream   Topical BID  . polyethylene glycol  17 g Per Tube Daily  . rifaximin  200 mg Per Tube BID  . sitaGLIPtin  25 mg Per Tube Daily    Continuous Infusions: . sodium chloride Stopped (12/28/20 0515)  . albumin human    . ampicillin-sulbactam (UNASYN) IV 3 g (12/28/20 1001)  . dexmedetomidine (PRECEDEX) IV infusion 0.6 mcg/kg/hr (12/28/20 0947)  . famotidine (PEPCID) IV Stopped (12/27/20 2131)  . feeding supplement (VITAL AF 1.2 CAL) 1,000 mL (12/27/20 5277)  . levETIRAcetam Stopped (12/27/20 2346)  . norepinephrine (LEVOPHED) Adult infusion 6 mcg/min (12/28/20 0700)    PRN Meds: sodium chloride, albumin human, fentaNYL (SUBLIMAZE) injection, ipratropium-albuterol, metoprolol tartrate, midazolam, ondansetron **OR** ondansetron (ZOFRAN) IV  Physical Exam Constitutional:      Comments: Eyes closed.  Pulmonary:     Comments: On ventilator. Skin:    General: Skin is warm and dry.             Vital Signs: BP (!) 155/65   Pulse 76   Temp 98.4 F (36.9 C)   Resp (!) 22   Ht $R'5\' 7"'Ln$  (1.702 m)   Wt 106.6 kg   SpO2 97%   BMI 36.81 kg/m  SpO2: SpO2: 97 % O2 Device: O2 Device: Ventilator O2 Flow Rate:  O2 Flow Rate (L/min): 15 L/min  Intake/output summary:   Intake/Output Summary (Last 24 hours) at 12/28/2020 1149 Last data filed at 12/28/2020 1100 Gross per 24 hour  Intake 1778.74 ml  Output 688 ml  Net 1090.74 ml   LBM: Last BM Date: 12/28/20 Baseline Weight: Weight: 104.5 kg Most recent weight: Weight: 106.6 kg        Patient Active Problem List   Diagnosis Date Noted  . Anasarca 12/24/2020  . Acute metabolic encephalopathy 82/42/3536  . Hyperammonemia ([place]) 12/24/2020  . Atypical pneumonia 12/24/2020  . Acute on chronic respiratory failure with hypoxia and hypercapnia ([place]) 12/24/2020  . AKI (acute kidney injury) ([place])   . Severe sepsis ([place]) 01/01/2021  . Trauma   . Cirrhosis of liver with ascites ([place])   . Symptomatic anemia 10/15/2020  . Macrocytic anemia 10/14/2020  . Acute blood loss anemia 10/14/2020  . Chronic respiratory failure ([place]) 10/14/2020  . Chronic kidney disease   . Hyperkalemia   . Hematoma of right chest wall   . Acute on chronic diastolic CHF (congestive heart failure) ([place]) 09/29/2020  . Cellulitis of left leg 09/28/2020  . Rash and nonspecific skin eruption 09/28/2020  . Left leg cellulitis 09/28/2020  . Normocytic anemia 06/11/2020  . COPD with acute exacerbation ([place]) 06/11/2020  . Chronic diarrhea of unknown origin   . Acute left flank pain 01/15/2020  . Hypokalemia 11/14/2019  . Proteinuria 08/09/2019  . Acute on chronic congestive heart failure ([place]) 07/09/2019  . Nonrheumatic mitral valve regurgitation 07/08/2019  . Other persistent atrial fibrillation ([place]) 07/08/2019  . Diarrhea 11/18/2018  . Nausea  without vomiting 11/18/2018  . Goals of care, counseling/discussion 09/12/2018  . 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 History of repair of right rotator cuff 08/25/2016  . Acute respiratory failure with hypoxia and hypercapnia ([place]) 08/06/2016  . Nontraumatic tear of right rotator cuff 04/21/2016  . Atypical chest pain  04/07/2016  . Impingement syndrome of right shoulder 12/29/2015  . Primary osteoarthritis of first carpometacarpal joint of left hand 10/27/2015  . Right shoulder pain 10/27/2015  . OSA on CPAP 10/14/2015  . COPD (chronic obstructive pulmonary disease) ([place]) 07/13/2015  . Type 2 diabetes mellitus with microalbuminuria, without long-term current use of insulin ([place]) 07/13/2015  . Hypertension 03/05/2015  . Difficulty sleeping 11/26/2014  . Chronic kidney disease, stage 3a 07/31/2014  . Seizure disorder ([place]) 07/31/2014  . History of colonic polyps 05/21/2014  . Hyperlipidemia, unspecified 04/30/2014    Palliative Care Assessment & Plan    Recommendations/Plan:  Per Suan Halter is first power of attorney for patient.  Beverlee Nims states is second power of attorney if Collins Scotland is unable or unwilling to be POA.  Beverlee Nims states she will be coming from [place] within the next 24 hours  Meet Beverlee Nims and Lynn at 12:00 tomorrow at bedside.     Code Status:    Code Status Orders  (From admission, onward)         Start     Ordered   12/26/20 1154  Do not attempt resuscitation (DNR)  Continuous       Question Answer Comment  In the event of cardiac or respiratory ARREST Do not call a "code blue"   In the event of cardiac or respiratory ARREST Do not perform Intubation, CPR, defibrillation or ACLS   In the event of cardiac or respiratory ARREST Use medication by any route, position, wound care, and other measures to relive pain and suffering. May use oxygen, suction and manual treatment of airway obstruction as needed for comfort.      12/26/20 1153        Code Status History    Date Active Date Inactive Code Status Order ID Comments User Context   12/26/2020 1152 12/26/2020 1153 DNR [id number]  Flora Lipps, MD Inpatient   12/24/2020 1626 12/26/2020 1152 Full Code [id number]  Ezekiel Slocumb, DO Inpatient   01/05/2021 1810 12/24/2020 1626 Partial Code [id number]  Cox, Briant Cedar, DO ED    01/05/2021 1801 12/20/2020 1810 Full Code [id number]  Cox, Amy N, DO ED   10/14/2020 1900 10/20/2020 0009 Full Code [id number]  Collier Bullock, MD ED   09/28/2020 1955 10/06/2020 2131 Full Code [id number]  Para Skeans, MD ED   06/11/2020 2048 06/13/2020 2012 Full Code [id number]  Vianne Bulls, MD ED   06/01/2019 1753 06/03/2019 1801 Full Code [id number]  Mayo, Pete Pelt, MD Inpatient   08/06/2016 1639 08/08/2016 1545 Full Code [id number]  Henreitta Leber, MD Inpatient   Advance Care Planning Activity    Advance Directive Documentation   Flowsheet Row Most Recent Value  Type of Advance Directive Healthcare Power of Attorney  Pre-existing out of facility DNR order (yellow form or pink MOST form) --  "MOST" Form in Place? --       Prognosis:  Very poor    Care plan was discussed with CCM Kasa  Thank you for allowing the Palliative Medicine Team to assist in the care of this patient.   Total Time 25 min Prolonged Time Billed  no  Greater than 50%  of this time was spent counseling and coordinating care related to the above assessment and plan.  Faizan Geraci, NP  Please contact Palliative Medicine Team phone at [phone] for questions and concerns.      

## 2020-12-28 NOTE — Progress Notes (Signed)
[facility]  ROUNDING NOTE   Subjective:   UOP 583 mL - furosemide 80mg IV x 2   Creatinine 2.39 (2.57) (2.28)  On phone with Daughter, Diana.  Continued on norepinephrine  Objective:  Vital signs in last 24 hours:  Temp:  [98.4 F (36.9 C)-100.5 F (38.1 C)] 98.4 F (36.9 C) (02/14 0700) Pulse Rate:  [76-109] 76 (02/14 1100) Resp:  [15-22] 22 (02/14 1100) BP: (87-155)/(37-65) 155/65 (02/14 1100) SpO2:  [89 %-97 %] 97 % (02/14 1100) FiO2 (%):  [30 %] 30 % (02/14 0745)  Weight change:  Filed Weights   12/25/20 0309 12/26/20 0432 12/27/20 0500  Weight: 100.6 kg 105.7 kg 106.6 kg    Intake/Output: I/O last 3 completed shifts: In: 3078.9 [I.V.:573.4; Other:45; NG/GT:1860; IV Piggyback:600.4] Out: 693 [Urine:643; Stool:50]   Intake/Output this shift:  Total I/O In: -  Out: 100 [Urine:100]  Physical Exam: General: Critically ill  Head: ETT   Eyes: Pinpoint pupils  Neck: trachea midline  Lungs:  PRVC 30%  Heart: irregular  Abdomen:  Soft, not distended  Extremities:  + peripheral edema.  Neurologic: Intubated and sedated  Skin: No lesions  Access: Left IJ temp HD catheter    Basic Metabolic Panel: Recent Labs  Lab 12/30/2020 1604 12/24/20 0626 12/24/20 1620 12/25/20 0351 12/26/20 0537 12/27/20 0531 12/28/20 0622  NA 140   < > 140 141 139 140 139  K 5.5*   < > 6.3* 4.9 4.6 4.3 4.2  CL 101   < > 101 104 103 105 107  CO2 25   < > 24 28 27 27 25  GLUCOSE 123*   < > 134* 102* 146* 150* 191*  BUN 42*   < > 54* 42* 54* 65* 72*  CREATININE 2.12*   < > 2.45* 1.87* 2.28* 2.57* 2.39*  CALCIUM 10.6*   < > 10.2 9.0 8.8* 8.5* 8.6*  MG 2.2  --   --  1.8 2.1 2.4 2.5*  PHOS  --   --   --  3.3 2.7 3.2 3.3   < > = values in this interval not displayed.    Liver Function Tests: Recent Labs  Lab 12/19/2020 1604 12/25/20 0351 12/26/20 0537 12/27/20 0531 12/28/20 0622  AST 33 28 30 23 23  ALT 15 13 12 12 14  ALKPHOS 88 62 61 55 63  BILITOT 1.1 0.8  0.6 0.6 0.7  PROT 6.8 5.5* 5.2* 4.8* 5.4*  ALBUMIN 3.8 3.1* 2.9* 2.5* 2.7*   No results for input(s): LIPASE, AMYLASE in the last 168 hours. Recent Labs  Lab 12/24/20 1201 12/24/20 2355 12/25/20 0351  AMMONIA 58* 33 29    CBC: Recent Labs  Lab 12/25/20 0351 12/26/20 0537 12/27/20 0531 12/28/20 0622 12/28/20 1013  WBC 17.6* 15.9* 15.3* 24.9* 24.3*  NEUTROABS 15.9* 13.5* 12.7* 20.3* 19.7*  HGB 9.8* 10.1* 9.5* 10.4* 10.7*  HCT 32.4* 33.7* 32.2* 34.6* 36.2  MCV 90.5 90.8 91.2 90.6 91.4  PLT 402* 363 333 433* 434*    Cardiac Enzymes: No results for input(s): CKTOTAL, CKMB, CKMBINDEX, TROPONINI in the last 168 hours.  BNP: Invalid input(s): POCBNP  CBG: Recent Labs  Lab 12/27/20 1909 12/27/20 2318 12/28/20 0345 12/28/20 0731 12/28/20 1144  GLUCAP 159* 141* 149* 139* 158*    Microbiology: Results for orders placed or performed during the hospital encounter of 12/25/2020  SARS CORONAVIRUS 2 (TAT 6-24 HRS) Nasopharyngeal Nasopharyngeal Swab     Status: None   Collection Time: 12/18/2020    4:04 PM   Specimen: Nasopharyngeal Swab  Result Value Ref Range Status   SARS Coronavirus 2 NEGATIVE NEGATIVE Final    Comment: (NOTE) SARS-CoV-2 target nucleic acids are NOT DETECTED.  The SARS-CoV-2 RNA is generally detectable in upper and lower respiratory specimens during the acute phase of infection. Negative results do not preclude SARS-CoV-2 infection, do not rule out co-infections with other pathogens, and should not be used as the sole basis for treatment or other patient management decisions. Negative results must be combined with clinical observations, patient history, and epidemiological information. The expected result is Negative.  Fact Sheet for Patients: https://www.fda.gov/media/138098/download  Fact Sheet for Healthcare Providers: https://www.fda.gov/media/138095/download  This test is not yet approved or cleared by the [place] FDA and  has been  authorized for detection and/or diagnosis of SARS-CoV-2 by FDA under an Emergency Use Authorization (EUA). This EUA will remain  in effect (meaning this test can be used) for the duration of the COVID-19 declaration under Se ction 564(b)(1) of the Act, 21 U.S.C. section 360bbb-3(b)(1), unless the authorization is terminated or revoked sooner.  Performed at [facility], [address] [place], [place] [address]   Culture, blood (single)     Status: Abnormal   Collection Time: 12/20/2020  4:58 PM   Specimen: BLOOD  Result Value Ref Range Status   Specimen Description   Final    BLOOD RIGHT ANTECUBITAL Performed at [facility], [address] [place], [place] [address]    Special Requests   Final    BOTTLES DRAWN AEROBIC AND ANAEROBIC Blood Culture adequate volume Performed at [facility], [address] [place], [place] [address]    Culture  Setup Time   Final    GRAM POSITIVE COCCI AEROBIC BOTTLE ONLY CRITICAL RESULT CALLED TO, READ BACK BY AND VERIFIED WITH: SUSAN WATSON PHARMD AT 1705 ON 12/24/20 SNG ANAEROBIC BOTTLE ONLY GRAM POSITIVE RODS CRITICAL RESULT CALLED TO, READ BACK BY AND VERIFIED WITH: NATHAN BELUE @2236 ON 12/24/20 SKL    Culture (A)  Final    AEROCOCCUS VIRIDANS PROPIONIBACTERIUM SPECIES Standardized susceptibility testing for this organism is not available. Performed at [facility], [address] , [place] [address]    Report Status 12/28/2020 FINAL  Final  Blood Culture ID Panel (Reflexed)     Status: None   Collection Time: 01/03/2021  4:58 PM  Result Value Ref Range Status   Enterococcus faecalis NOT DETECTED NOT DETECTED Final   Enterococcus Faecium NOT DETECTED NOT DETECTED Final   Listeria monocytogenes NOT DETECTED NOT DETECTED Final   Staphylococcus species NOT DETECTED NOT DETECTED Final   Staphylococcus aureus (BCID) NOT DETECTED NOT DETECTED Final   Staphylococcus epidermidis NOT DETECTED NOT DETECTED  Final   Staphylococcus lugdunensis NOT DETECTED NOT DETECTED Final   Streptococcus species NOT DETECTED NOT DETECTED Final   Streptococcus agalactiae NOT DETECTED NOT DETECTED Final   Streptococcus pneumoniae NOT DETECTED NOT DETECTED Final   Streptococcus pyogenes NOT DETECTED NOT DETECTED Final   A.calcoaceticus-baumannii NOT DETECTED NOT DETECTED Final   Bacteroides fragilis NOT DETECTED NOT DETECTED Final   Enterobacterales NOT DETECTED NOT DETECTED Final   Enterobacter cloacae complex NOT DETECTED NOT DETECTED Final   Escherichia coli NOT DETECTED NOT DETECTED Final   Klebsiella aerogenes NOT DETECTED NOT DETECTED Final   Klebsiella oxytoca NOT DETECTED NOT DETECTED Final   Klebsiella pneumoniae NOT DETECTED NOT DETECTED Final   Proteus species NOT DETECTED NOT DETECTED Final     Salmonella species NOT DETECTED NOT DETECTED Final   Serratia marcescens NOT DETECTED NOT DETECTED Final   Haemophilus influenzae NOT DETECTED NOT DETECTED Final   Neisseria meningitidis NOT DETECTED NOT DETECTED Final   Pseudomonas aeruginosa NOT DETECTED NOT DETECTED Final   Stenotrophomonas maltophilia NOT DETECTED NOT DETECTED Final   Candida albicans NOT DETECTED NOT DETECTED Final   Candida auris NOT DETECTED NOT DETECTED Final   Candida glabrata NOT DETECTED NOT DETECTED Final   Candida krusei NOT DETECTED NOT DETECTED Final   Candida parapsilosis NOT DETECTED NOT DETECTED Final   Candida tropicalis NOT DETECTED NOT DETECTED Final   Cryptococcus neoformans/gattii NOT DETECTED NOT DETECTED Final    Comment: Performed at [facility], [address] [place], [place] [address]  Body fluid culture     Status: None   Collection Time: 12/24/20  2:37 PM   Specimen: PATH Cytology Peritoneal fluid  Result Value Ref Range Status   Specimen Description   Final    PERITONEAL Performed at [facility], [address] [place], [place] [address]    Special Requests   Final     NONE Performed at [facility], [address] [place], [place] [address]    Gram Stain   Final    RARE WBC PRESENT,BOTH PMN AND MONONUCLEAR NO ORGANISMS SEEN    Culture   Final    NO GROWTH 3 DAYS Performed at [facility], [address] [place], [place] [address]    Report Status 12/28/2020 FINAL  Final  Fungus Culture With Stain     Status: None (Preliminary result)   Collection Time: 12/24/20  2:37 PM   Specimen: PATH Cytology Peritoneal fluid  Result Value Ref Range Status   Fungus Stain Final report  Final    Comment: (NOTE) Performed At: [facility] [address] [place], [place] [id number] Nagendra Sanjai MD Ph:[phone]    Fungus (Mycology) Culture PENDING  Incomplete   Fungal Source PERITONEAL  Final    Comment: Performed at [facility], [address] [place], [place] [address]  Aerobic/Anaerobic Culture (surgical/deep wound)     Status: None (Preliminary result)   Collection Time: 12/24/20  2:37 PM   Specimen: PATH Cytology Peritoneal fluid  Result Value Ref Range Status   Specimen Description   Final    PERITONEAL Performed at [facility], [address] [place], [place] [address]    Special Requests   Final    NONE Performed at  Hospital Lab, [address] [place], [place] [address]    Gram Stain   Final    FEW WBC PRESENT, PREDOMINANTLY MONONUCLEAR NO ORGANISMS SEEN    Culture   Final    NO GROWTH 3 DAYS NO ANAEROBES ISOLATED; CULTURE IN PROGRESS FOR 5 DAYS Performed at [facility], [address] , [place] [address]    Report Status PENDING  Incomplete  Fungus Culture Result     Status: None   Collection Time: 12/24/20  2:37 PM  Result Value Ref Range Status   Result 1 Comment  Final    Comment: (NOTE) KOH/Calcofluor preparation:  no fungus observed. Performed At: [facility] [address] [place], [place] [id number] Nagendra Sanjai MD Ph:[phone]   MRSA  PCR Screening     Status: None   Collection Time: 12/24/20 11:22 PM   Specimen: Nasopharyngeal  Result Value Ref Range Status   MRSA by PCR NEGATIVE NEGATIVE Final      Comment:        The GeneXpert MRSA Assay (FDA approved for NASAL specimens only), is one component of a comprehensive MRSA colonization surveillance program. It is not intended to diagnose MRSA infection nor to guide or monitor treatment for MRSA infections. Performed at [facility], [address] [place], [place] [address]     Coagulation Studies: Recent Labs    12/26/20 0537  LABPROT 17.7*  INR 1.5*    Urinalysis: No results for input(s): COLORURINE, LABSPEC, PHURINE, GLUCOSEU, HGBUR, BILIRUBINUR, KETONESUR, PROTEINUR, UROBILINOGEN, NITRITE, LEUKOCYTESUR in the last 72 hours.  Invalid input(s): APPERANCEUR    Imaging: No results found.   Medications:   . sodium chloride Stopped (12/28/20 0515)  . albumin human    . ampicillin-sulbactam (UNASYN) IV 3 g (12/28/20 1001)  . dexmedetomidine (PRECEDEX) IV infusion 0.6 mcg/kg/hr (12/28/20 0947)  . famotidine (PEPCID) IV Stopped (12/27/20 2131)  . feeding supplement (VITAL AF 1.2 CAL) 1,000 mL (12/27/20 8756)  . levETIRAcetam 500 mg (12/28/20 1217)  . norepinephrine (LEVOPHED) Adult infusion 6 mcg/min (12/28/20 0700)   . atorvastatin  40 mg Per Tube Daily  . chlorhexidine gluconate (MEDLINE KIT)  15 mL Mouth Rinse BID  . Chlorhexidine Gluconate Cloth  6 each Topical Daily  . docusate  100 mg Per Tube BID  . feeding supplement (PROSource TF)  45 mL Per Tube TID  . fluticasone  2 spray Each Nare Daily  . heparin  5,000 Units Subcutaneous Q8H  . lactulose  20 g Per Tube BID  . mouth rinse  15 mL Mouth Rinse 10 times per day  . midodrine  5 mg Per Tube TID WC  . multivitamin  1 tablet Per Tube QHS  . nystatin cream   Topical BID  . polyethylene glycol  17 g Per Tube Daily  . rifaximin  200 mg Per Tube BID  . sitaGLIPtin  25 mg Per Tube  Daily   sodium chloride, albumin human, fentaNYL (SUBLIMAZE) injection, ipratropium-albuterol, metoprolol tartrate, midazolam, ondansetron **OR** ondansetron (ZOFRAN) IV  Assessment/ Plan:  Ms. AUBRIANNA ORCHARD is a 80 y.o. white female with sleep apnea, seizure disorder, atrial fibrillation, congestive heart failure, hypertension, hyperlipidemia, depression, diabetes mellitus type II, COPD, polycythemia who is admitted to [facility] on 02/14/3294 for Metabolic encephalopathy [J88.41] Abdominal distension [R14.0] Hypoxia [R09.02] Other ascites [R18.8] Acute on chronic diastolic congestive heart failure ([place]) [I50.33] AKI (acute kidney injury) ([place]) [N17.9] COPD with acute exacerbation ([place]) [J44.1] Sepsis ([place]) [A41.9] Sepsis with acute hypoxic respiratory failure without septic shock, due to unspecified organism ([place]) [A41.9, R65.20, J96.01]  1. 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 Baseline creatinine of 1.03, GFR of 56 on 10/18/2020.  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  - If aggressive measures are continued, patient will need renal replacement therapy.   2. Acute respiratory failure: requiring intubation and mechanical ventilation. Secondary to pneumonia. On cefepime and vancomycin.   3. Hypotension: with sepsis: requiring vasopressors: norepinephrine. Empiric antibiotics as above: cefepime and vancomycin Blood cultures positive for Aerococcus viridans Not enough PMNs in peritoneal fluid to diagnose with spontaneous bacterial peritonitis.   4. Anemia with renal failure: with history of polycythemia. Hemoglobin stable   LOS: 5 Ramla Hase 2/14/20221:42 PM

## 2020-12-28 NOTE — Plan of Care (Signed)
  Interdisciplinary Goals of Care Family Meeting   Date carried out:: 12/28/2020  Location of the meeting: Bedside  Member's involved: Physician and Bedside Registered Nurse  Durable Power of Attorney or acting medical decision maker: I spoke to [place], the patient's daughter who lives in [place].   Discussion: We discussed goals of care for Angelica Juarez .  She says that she is confident that her mother would not want prolonged life support interventions as she unfortunately requires right now.  She doesn't want [place] to escalate care and she plans to fly out tonight to see her mom.  She thinks that they should withdraw care.  She doesn't want hemodialysis rightnow.   Code status: Full DNR  Disposition: Continue current acute care  Time spent for the meeting: 30 minutes  Roselie Awkward 12/28/2020, 12:00 PM

## 2020-12-28 NOTE — Progress Notes (Signed)
NAME:  Angelica Juarez, MRN:  [id number], DOB:  January 07, 1941, LOS: 5 ADMISSION DATE:  12/25/2020, CONSULTATION DATE:  2/10 REFERRING MD:  Arbutus Ped, CHIEF COMPLAINT:  [email or web address]   Brief History:  80 y/o female admitted with confusion, found to have evidence of cirrhosis not previously recognized and hepatic encephalopathy.  After admission developed worsening respiratory failure and required intubation for airway protection and work of breathing.  Has developed kidney failure and has required hemodialysis since admission.   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 [place] Atrial fibrillation HFpEF HTN  Significant Hospital Events:  2/9 admitted for Pneumonia 2/10 patient developed acute resp failure and severe mental status 2/10 US paracentesis 2L yellow fluid removed 2/10 emergently intubated failed biPAP, started emergent HD 2/11 palliative consulted 2/12 family changed code status to DNR  Consults:  Nephrology Palliative care  Procedures:  2/10 CT head  2/10 HD catheter L IJ >    Significant Diagnostic Tests:  2/11 renal ultrasound > normal  Micro Data:  sars cov 2 2/9 > neg Blood culture 2/9-> aerococcus 1/4, propionibacterium 1/4 Peritoneal 2/10 >   Antimicrobials:  Ceftriaxone 2/9-2/11 Azithromycin 2/9-2/11  Vanco 2/11 >> 2/14 Cefepime 2/11 >>   Interim History / Subjective:  Low grade temp overnight WBC up Remains encephalopathic Given high dose lasix yesteday Made some urine  Objective   Blood pressure (!) 107/46, pulse 92, temperature 98.4 F (36.9 C), resp. rate 17, height 5\' 7"  (1.702 m), weight 106.6 kg, SpO2 92 %.    Vent Mode: PRVC FiO2 (%):  [30 %] 30 % Set Rate:  [16 bmp] 16 bmp Vt Set:  [500 mL] 500 mL PEEP:  [5 cmH20] 5 cmH20 Plateau Pressure:  [17 cmH20-19 cmH20] 17 cmH20   Intake/Output Summary (Last 24 hours) at 12/28/2020 5176 Last data filed at 12/28/2020 0700 Gross per 24 hour  Intake 2026.82 ml  Output 633 ml   Net 1393.82 ml   Filed Weights   12/25/20 0309 12/26/20 0432 12/27/20 0500  Weight: 100.6 kg 105.7 kg 106.6 kg    Examination:  General:  In bed on vent HENT: NCAT ETT in place PULM: CTA B, vent supported breathing CV: RRR, no mgr GI: BS+, soft, nontender MSK: normal bulk and tone Neuro: will open eyes to voice, doesn't follow commands    Resolved Hospital Problem list     Assessment & Plan:  80 y/o female with acute hypoxemic respiratory failure due to acute pulmonary edema due to AKI in the setting of newly diagnosed acute hepatic encephalopathy and advanced cirrhosis.  Acute hypoxemic/hypercarbic respiratory failure COPD, not in exacerbation Full mechanical vent support VAP prevention Daily WUA/SBT  AKI> Hepatorenal vs ATN; received HD on 2/11, has not had since; given high dose lasix yesterday F/u HD decision with renal Monitor BMET and UOP Replace electrolytes as needed  Sepsis due to SBP, culture negative so far Bacteremia> unclear if this is due to SBP or contaminant Stop vanc Change to SBP coverage > unasyn? Discuss with pharmacy  DM2 with hyperglycemia Monitor glucose  Cirrhosis with portal hypertension and ascites consider aldactone Albumin to continue  Acute hepatic encephlopathy > persistent Need for sedation for mechanical ventilation Stop continuous fentanyl Add precedex for vent synchrony Versed prn, minimize  Atrial fibrillation Acute on chronic diastolic heart failure Shock> presumably related to advanced cirrhosis, ddx includes sepsis but doesn't appear toxic today Add midodrine  Polycythemia vera Monitor cbc  Seizure disorder Keppra  Prognosis guarded  Best practice (evaluated daily)  Diet: tube feeding Pain/Anxiety/Delirium protocol (if indicated): as above VAP protocol (if indicated): yes DVT prophylaxis: sub q heparin GI prophylaxis: famotidine Glucose control: monitor Mobility: bed rest Disposition:remain in  ICU  Goals of Care:  Last date of multidisciplinary goals of care discussion:2/11  Family and staff present: Discussed with Collins Scotland POA significant other and daughter Beverlee Nims Summary of discussion: Collins Scotland is the power of attorney but he has dementia, forgetfulness and needs help to make decisions.  Daughter Beverlee Nims who lives in [place] wants to help and will try to come in to be at the bedside Follow up goals of care discussion due: 2/19 Code Status: DNR  Labs   CBC: Recent Labs  Lab 12/28/2020 1604 12/24/20 0626 12/25/20 0351 12/26/20 0537 12/27/20 0531 12/28/20 0622  WBC 23.1* 21.8* 17.6* 15.9* 15.3* 24.9*  NEUTROABS 20.7*  --  15.9* 13.5* 12.7* 20.3*  HGB 11.6* 11.0* 9.8* 10.1* 9.5* 10.4*  HCT 39.4 37.8 32.4* 33.7* 32.2* 34.6*  MCV 92.9 93.6 90.5 90.8 91.2 90.6  PLT 605* 563* 402* 363 333 433*    Basic Metabolic Panel: Recent Labs  Lab 12/24/2020 1604 12/24/20 0626 12/24/20 1620 12/25/20 0351 12/26/20 0537 12/27/20 0531 12/28/20 0622  NA 140   < > 140 141 139 140 139  K 5.5*   < > 6.3* 4.9 4.6 4.3 4.2  CL 101   < > 101 104 103 105 107  CO2 25   < > 24 28 27 27 25   GLUCOSE 123*   < > 134* 102* 146* 150* 191*  BUN 42*   < > 54* 42* 54* 65* 72*  CREATININE 2.12*   < > 2.45* 1.87* 2.28* 2.57* 2.39*  CALCIUM 10.6*   < > 10.2 9.0 8.8* 8.5* 8.6*  MG 2.2  --   --  1.8 2.1 2.4 2.5*  PHOS  --   --   --  3.3 2.7 3.2 3.3   < > = values in this interval not displayed.   GFR: Estimated Creatinine Clearance: 24.8 mL/min (A) (by C-G formula based on SCr of 2.39 mg/dL (H)). Recent Labs  Lab 01/11/2021 1604 12/24/20 0626 12/24/20 1620 12/25/20 0351 12/26/20 0537 12/27/20 0531 12/28/20 0622  PROCALCITON  --   --  0.24 0.22 0.43  --   --   WBC 23.1*   < >  --  17.6* 15.9* 15.3* 24.9*  LATICACIDVEN 1.8  --   --   --   --   --   --    < > = values in this interval not displayed.    Liver Function Tests: Recent Labs  Lab 12/15/2020 1604 12/25/20 0351 12/26/20 0537  12/27/20 0531 12/28/20 0622  AST 33 28 30 23 23   ALT 15 13 12 12 14   ALKPHOS 88 62 61 55 63  BILITOT 1.1 0.8 0.6 0.6 0.7  PROT 6.8 5.5* 5.2* 4.8* 5.4*  ALBUMIN 3.8 3.1* 2.9* 2.5* 2.7*   No results for input(s): LIPASE, AMYLASE in the last 168 hours. Recent Labs  Lab 12/24/20 1201 12/24/20 2355 12/25/20 0351  AMMONIA 58* 33 29    ABG    Component Value Date/Time   PHART 7.31 (L) 12/24/2020 1820   PCO2ART 57 (H) 12/24/2020 1820   PO2ART 65 (L) 12/24/2020 1820   HCO3 28.7 (H) 12/24/2020 1820   ACIDBASEDEF 1.7 12/24/2020 1138   O2SAT 90.3 12/24/2020 1820     Coagulation Profile: Recent Labs  Lab 12/26/20 0537  INR 1.5*    Cardiac Enzymes: No results for input(s): CKTOTAL, CKMB, CKMBINDEX, TROPONINI in the last 168 hours.  HbA1C: Hgb A1c MFr Bld  Date/Time Value Ref Range Status  09/28/2020 09:49 PM 5.7 (H) 4.8 - 5.6 % Final    Comment:    (NOTE) Pre diabetes:          5.7%-6.4%  Diabetes:              >6.4%  Glycemic control for   <7.0% adults with diabetes     CBG: Recent Labs  Lab 12/27/20 1547 12/27/20 1909 12/27/20 2318 12/28/20 0345 12/28/20 0731  GLUCAP 170* 159* 141* 149* 139*     Critical care time: 35 minutes    Roselie Awkward, MD [facility] Pager: [phone] Cell: [phone] If no response, call [phone]

## 2020-12-29 ENCOUNTER — Inpatient Hospital Stay: Payer: Medicare Other

## 2020-12-29 DIAGNOSIS — A419 Sepsis, unspecified organism: Secondary | ICD-10-CM | POA: Diagnosis not present

## 2020-12-29 DIAGNOSIS — R6520 Severe sepsis without septic shock: Secondary | ICD-10-CM | POA: Diagnosis not present

## 2020-12-29 DIAGNOSIS — J9601 Acute respiratory failure with hypoxia: Secondary | ICD-10-CM | POA: Diagnosis not present

## 2020-12-29 DIAGNOSIS — Z7189 Other specified counseling: Secondary | ICD-10-CM | POA: Diagnosis not present

## 2020-12-29 DIAGNOSIS — I5033 Acute on chronic diastolic (congestive) heart failure: Secondary | ICD-10-CM | POA: Diagnosis not present

## 2020-12-29 DIAGNOSIS — G9341 Metabolic encephalopathy: Secondary | ICD-10-CM | POA: Diagnosis not present

## 2020-12-29 DIAGNOSIS — Z515 Encounter for palliative care: Secondary | ICD-10-CM | POA: Diagnosis not present

## 2020-12-29 LAB — COMPREHENSIVE METABOLIC PANEL
ALT: 13 U/L (ref 0–44)
AST: 25 U/L (ref 15–41)
Albumin: 2.6 g/dL — ABNORMAL LOW (ref 3.5–5.0)
Alkaline Phosphatase: 73 U/L (ref 38–126)
Anion gap: 8 (ref 5–15)
BUN: 74 mg/dL — ABNORMAL HIGH (ref 8–23)
CO2: 26 mmol/L (ref 22–32)
Calcium: 9 mg/dL (ref 8.9–10.3)
Chloride: 109 mmol/L (ref 98–111)
Creatinine, Ser: 1.72 mg/dL — ABNORMAL HIGH (ref 0.44–1.00)
GFR, Estimated: 30 mL/min — ABNORMAL LOW (ref >60)
Glucose, Bld: 195 mg/dL — ABNORMAL HIGH (ref 70–99)
Potassium: 3.8 mmol/L (ref 3.5–5.1)
Sodium: 143 mmol/L (ref 135–145)
Total Bilirubin: 0.8 mg/dL (ref 0.3–1.2)
Total Protein: 5.5 g/dL — ABNORMAL LOW (ref 6.5–8.1)

## 2020-12-29 LAB — CBC WITH DIFFERENTIAL/PLATELET
Abs Immature Granulocytes: 1.12 10*3/uL — ABNORMAL HIGH (ref 0.00–0.07)
Basophils Absolute: 0.2 10*3/uL — ABNORMAL HIGH (ref 0.0–0.1)
Basophils Relative: 1 %
Eosinophils Absolute: 0.6 10*3/uL — ABNORMAL HIGH (ref 0.0–0.5)
Eosinophils Relative: 2 %
HCT: 35.9 % — ABNORMAL LOW (ref 36.0–46.0)
Hemoglobin: 11.4 g/dL — ABNORMAL LOW (ref 12.0–15.0)
Immature Granulocytes: 5 %
Lymphocytes Relative: 1 %
Lymphs Abs: 0.3 10*3/uL — ABNORMAL LOW (ref 0.7–4.0)
MCH: 27.8 pg (ref 26.0–34.0)
MCHC: 31.8 g/dL (ref 30.0–36.0)
MCV: 87.6 fL (ref 80.0–100.0)
Monocytes Absolute: 1.9 10*3/uL — ABNORMAL HIGH (ref 0.1–1.0)
Monocytes Relative: 8 %
Neutro Abs: 20.5 10*3/uL — ABNORMAL HIGH (ref 1.7–7.7)
Neutrophils Relative %: 83 %
Platelets: 461 10*3/uL — ABNORMAL HIGH (ref 150–400)
RBC: 4.1 MIL/uL (ref 3.87–5.11)
RDW: 17.5 % — ABNORMAL HIGH (ref 11.5–15.5)
WBC: 24.5 10*3/uL — ABNORMAL HIGH (ref 4.0–10.5)
nRBC: 0.2 % (ref 0.0–0.2)

## 2020-12-29 LAB — GLUCOSE, CAPILLARY
Glucose-Capillary: 162 mg/dL — ABNORMAL HIGH (ref 70–99)
Glucose-Capillary: 168 mg/dL — ABNORMAL HIGH (ref 70–99)
Glucose-Capillary: 162 mg/dL — ABNORMAL HIGH (ref 70–99)
Glucose-Capillary: 181 mg/dL — ABNORMAL HIGH (ref 70–99)

## 2020-12-29 LAB — MAGNESIUM: Magnesium: 2.4 mg/dL (ref 1.7–2.4)

## 2020-12-29 LAB — PHOSPHORUS: Phosphorus: 2.5 mg/dL (ref 2.5–4.6)

## 2020-12-29 MED ORDER — MIDODRINE HCL 5 MG PO TABS
10.0000 mg | ORAL_TABLET | Freq: Three times a day (TID) | ORAL | Status: DC
  Administered 2020-12-29: 10 mg
  Filled 2020-12-29: qty 2

## 2020-12-29 MED ORDER — HYDROMORPHONE BOLUS VIA INFUSION
0.5000 mg | INTRAVENOUS | Status: DC | PRN
  Administered 2020-12-29: 1 mg via INTRAVENOUS
  Filled 2020-12-29: qty 1

## 2020-12-29 MED ORDER — GLYCOPYRROLATE 1 MG PO TABS
1.0000 mg | ORAL_TABLET | ORAL | Status: DC | PRN
Start: 2020-12-29 — End: 2020-12-30
  Filled 2020-12-29: qty 1

## 2020-12-29 MED ORDER — POLYVINYL ALCOHOL 1.4 % OP SOLN
1.0000 [drp] | Freq: Four times a day (QID) | OPHTHALMIC | Status: DC | PRN
  Filled 2020-12-29: qty 15

## 2020-12-29 MED ORDER — HALOPERIDOL LACTATE 5 MG/ML IJ SOLN: 0.5000 mg | INTRAMUSCULAR | Status: DC | PRN

## 2020-12-29 MED ORDER — ONDANSETRON HCL 4 MG/2ML IJ SOLN: 4.0000 mg | Freq: Four times a day (QID) | INTRAMUSCULAR | Status: DC | PRN

## 2020-12-29 MED ORDER — SODIUM CHLORIDE 0.9 % IV SOLN
3.0000 g | Freq: Four times a day (QID) | INTRAVENOUS | Status: DC
  Administered 2020-12-29: 3 g via INTRAVENOUS
  Filled 2020-12-29: qty 8
  Filled 2020-12-29: qty 3
  Filled 2020-12-29: qty 8

## 2020-12-29 MED ORDER — ONDANSETRON 4 MG PO TBDP: 4.0000 mg | ORAL_TABLET | Freq: Four times a day (QID) | ORAL | Status: DC | PRN

## 2020-12-29 MED ORDER — SODIUM CHLORIDE 0.9% FLUSH
3.0000 mL | Freq: Two times a day (BID) | INTRAVENOUS | Status: DC
  Administered 2020-12-29: 3 mL via INTRAVENOUS

## 2020-12-29 MED ORDER — ACETAMINOPHEN 650 MG RE SUPP: 650.0000 mg | Freq: Four times a day (QID) | RECTAL | Status: DC | PRN

## 2020-12-29 MED ORDER — BIOTENE DRY MOUTH MT LIQD: 15.0000 mL | OROMUCOSAL | Status: DC | PRN

## 2020-12-29 MED ORDER — SODIUM CHLORIDE 0.9 % IV SOLN
0.5000 mg/h | INTRAVENOUS | Status: DC
  Administered 2020-12-29: 0.5 mg/h via INTRAVENOUS
  Filled 2020-12-29: qty 5

## 2020-12-29 MED ORDER — HALOPERIDOL 0.5 MG PO TABS
0.5000 mg | ORAL_TABLET | ORAL | Status: DC | PRN
  Filled 2020-12-29: qty 1

## 2020-12-29 MED ORDER — SODIUM CHLORIDE 0.9% FLUSH: 3.0000 mL | INTRAVENOUS | Status: DC | PRN

## 2020-12-29 MED ORDER — GLYCOPYRROLATE 0.2 MG/ML IJ SOLN: 0.2000 mg | INTRAMUSCULAR | Status: DC | PRN

## 2020-12-29 MED ORDER — FAMOTIDINE 20 MG PO TABS: 20.0000 mg | ORAL_TABLET | Freq: Every day | ORAL | Status: DC

## 2020-12-29 MED ORDER — HALOPERIDOL LACTATE 2 MG/ML PO CONC
0.5000 mg | ORAL | Status: DC | PRN
  Filled 2020-12-29: qty 0.3

## 2020-12-29 MED ORDER — LORAZEPAM 2 MG/ML IJ SOLN
2.0000 mg | INTRAMUSCULAR | Status: DC | PRN
  Administered 2020-12-29: 2 mg via INTRAVENOUS

## 2020-12-29 MED ORDER — GLYCOPYRROLATE 0.2 MG/ML IJ SOLN
0.2000 mg | INTRAMUSCULAR | Status: DC | PRN
  Administered 2020-12-29: 0.2 mg via INTRAVENOUS
  Filled 2020-12-29: qty 1

## 2020-12-29 MED ORDER — LORAZEPAM 2 MG/ML IJ SOLN
INTRAMUSCULAR | Status: AC
  Administered 2020-12-29: 2 mg via INTRAVENOUS
  Filled 2020-12-29: qty 1

## 2020-12-29 MED ORDER — ACETAMINOPHEN 325 MG PO TABS: 650.0000 mg | ORAL_TABLET | Freq: Four times a day (QID) | ORAL | Status: DC | PRN

## 2020-12-30 LAB — AEROBIC/ANAEROBIC CULTURE W GRAM STAIN (SURGICAL/DEEP WOUND): Culture: NO GROWTH

## 2021-01-02 LAB — CULTURE, BLOOD (ROUTINE X 2)
Culture: NO GROWTH
Culture: NO GROWTH
Special Requests: ADEQUATE
Special Requests: ADEQUATE

## 2021-01-12 NOTE — Progress Notes (Signed)
PHARMACIST - PHYSICIAN COMMUNICATION  CONCERNING: IV to Oral Route Change Policy  RECOMMENDATION: This patient is receiving famotidine by the intravenous route.  Based on criteria approved by the Pharmacy and Therapeutics Committee, the intravenous medication(s) is/are being converted to the equivalent oral dose form(s).   DESCRIPTION: These criteria include:  The patient is eating (either orally or via tube) and/or has been taking other orally administered medications for a least 24 hours  The patient has no evidence of active gastrointestinal bleeding or impaired GI absorption (gastrectomy, short bowel, patient on TNA or NPO).  If you have questions about this conversion, please contact the [place], [facility] 01/01/2021 1:21 PM

## 2021-01-12 NOTE — Progress Notes (Signed)
1815 Pronounced dead. Daughter called after unable to find her. 89 Daughter with body at this time.

## 2021-01-12 NOTE — Progress Notes (Signed)
80 y/o F w/ PMH of polycythemia vera, CKDIII, COPD, a. fib, CHF, cirrhosis who has multiorgan failure and is now comfort care only. Hospitalist service will take over care tomorrow.  This is non billable note.

## 2021-01-12 NOTE — Progress Notes (Signed)
Pt extubated to comfort care.  

## 2021-01-12 NOTE — Progress Notes (Signed)
1440 Dilaudid drip started per orders. Family and significant other with patient. Prn Robinol amd Lorazipam given as needed.

## 2021-01-12 NOTE — Progress Notes (Signed)
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 fib not on anticoagulation due to history of retroperitoneal bleed, HFpEF, hypertension, cirrhosis  Admitted with acute respiratory failure secondary to pneumonia, multiorgan failure with AKI, liver failure, hepatorenal syndrome, developed worsening respiratory failure and required intubation for airway protection and work of breathing.  Has developed kidney failure and has required hemodialysis since admission.  Significant Diagnostic Tests:  CT head 2/9-no acute findings.  Echocardiogram 2/10-LVEF 60-65%, normal RV systolic function, mild enlargement of RV size  Micro Data:  Blood culture 2/9-GPC, GPR AEROCOCCUS VIRIDANS  Antimicrobials:  Ceftriaxone 2/9-2/11 Azithromycin 2/9-2/11  Vanco 2/11>> Cefepime 2/11>>    Significant Hospital Events:   2/9Admitted for Pneumonia 2/10 US paracentesis 2L yellow fluid removed 2/10 emergently intubatedafter shefailedBiPAP, HD catheter placed and dialyzed 2/11 remains intubated, multiorgan failure 2/12 remains on vent, failure to wean, +encephlopathy, DNR status 2/13 remains critically ill     CC  follow up respiratory failure  SUBJECTIVE Patient remains critically ill Prognosis is guarded  Vent Mode: PRVC FiO2 (%):  [30 %] 30 % Set Rate:  [16 bmp] 16 bmp Vt Set:  [500 mL] 500 mL PEEP:  [5 cmH20] 5 cmH20 Plateau Pressure:  [14 cmH20-17 cmH20] 17 cmH20 CBC    Component Value Date/Time   WBC 24.5 (H) 2021/01/25 0531   RBC 4.10 2021/01/25 0531   HGB 11.4 (L) January 25, 2021 0531   HGB 13.3 02/20/2014 2351   HCT 35.9 (L) January 25, 2021 0531   HCT 41.0 02/20/2014 2351   PLT 461 (H) 2021-01-25 0531   PLT 397 02/20/2014 2351   MCV 87.6 25-Jan-2021 0531   MCV 96 02/20/2014 2351   MCH 27.8 01/25/2021 0531   MCHC 31.8 2021-01-25 0531   RDW 17.5 (H) 2021/01/25 0531   RDW 13.4 02/20/2014 2351   LYMPHSABS 0.3 (L)  01-25-2021 0531   LYMPHSABS 0.9 (L) 09/24/2012 1309   MONOABS 1.9 (H) 25-Jan-2021 0531   MONOABS 0.5 09/24/2012 1309   EOSABS 0.6 (H) 2021-01-25 0531   EOSABS 0.5 09/24/2012 1309   BASOSABS 0.2 (H) 01/25/21 0531   BASOSABS 1 09/25/2012 0425   BASOSABS 0.1 09/24/2012 1309   BMP Latest Ref Rng & Units 2021-01-25 12/28/2020 12/27/2020  Glucose 70 - 99 mg/dL 195(H) 191(H) 150(H)  BUN 8 - 23 mg/dL 74(H) 72(H) 65(H)  Creatinine 0.44 - 1.00 mg/dL 1.72(H) 2.39(H) 2.57(H)  Sodium 135 - 145 mmol/L 143 139 140  Potassium 3.5 - 5.1 mmol/L 3.8 4.2 4.3  Chloride 98 - 111 mmol/L 109 107 105  CO2 22 - 32 mmol/L 26 25 27   Calcium 8.9 - 10.3 mg/dL 9.0 8.6(L) 8.5(L)    BP 131/63   Pulse (!) 105   Temp 100.3 F (37.9 C) (Axillary)   Resp (!) 22   Ht 5' 7"  (1.702 m)   Wt 106.6 kg   SpO2 95%   BMI 36.81 kg/m    I/O last 3 completed shifts: In: 3014.9 [I.V.:568; Other:45; NG/GT:1750; IV Piggyback:651.9] Out: 1710 [Urine:1510; Stool:200] No intake/output data recorded.  SpO2: 95 % O2 Flow Rate (L/min): 15 L/min FiO2 (%): 30 %  Estimated body mass index is 36.81 kg/m as calculated from the following:   Height as of this encounter: 5' 7"  (1.702 m).   Weight as of this encounter: 106.6 kg.  SIGNIFICANT EVENTS   REVIEW OF SYSTEMS  PATIENT IS UNABLE TO PROVIDE COMPLETE REVIEW OF SYSTEMS DUE TO SEVERE CRITICAL ILLNESS   Pressure Injury  12/27/20 Coccyx Mid;Left Deep Tissue Pressure Injury - Purple or maroon localized area of discolored intact skin or blood-filled blister due to damage of underlying soft tissue from pressure and/or shear. purple (Active)  12/27/20 0800  Location: Coccyx  Location Orientation: Mid;Left  Staging: Deep Tissue Pressure Injury - Purple or maroon localized area of discolored intact skin or blood-filled blister due to damage of underlying soft tissue from pressure and/or shear.  Wound Description (Comments): purple  Present on Admission: No      PHYSICAL  EXAMINATION:  GENERAL:critically ill appearing, +resp distress EYES: Pupils equal, round, reactive to light.  No scleral icterus.  MOUTH: Moist mucosal membrane. NECK: Supple.  PULMONARY: +rhonchi, +wheezing CARDIOVASCULAR: S1 and S2. Regular rate and rhythm. No murmurs, rubs, or gallops.  GASTROINTESTINAL: Soft, nontender, -distended.  Positive bowel sounds.   MUSCULOSKELETAL: No swelling, clubbing, or edema.  NEUROLOGIC: obtunded, GCS<8 SKIN:intact,warm,dry  MEDICATIONS: I have reviewed all medications and confirmed regimen as documented   CULTURE RESULTS   Recent Results (from the past 240 hour(s))  SARS CORONAVIRUS 2 (TAT 6-24 HRS) Nasopharyngeal Nasopharyngeal Swab     Status: None   Collection Time: 01/01/2021  4:04 PM   Specimen: Nasopharyngeal Swab  Result Value Ref Range Status   SARS Coronavirus 2 NEGATIVE NEGATIVE Final    Comment: (NOTE) SARS-CoV-2 target nucleic acids are NOT DETECTED.  The SARS-CoV-2 RNA is generally detectable in upper and lower respiratory specimens during the acute phase of infection. Negative results do not preclude SARS-CoV-2 infection, do not rule out co-infections with other pathogens, and should not be used as the sole basis for treatment or other patient management decisions. Negative results must be combined with clinical observations, patient history, and epidemiological information. The expected result is Negative.  Fact Sheet for Patients: SugarRoll.be  Fact Sheet for Healthcare Providers: https://www.woods-mathews.com/  This test is not yet approved or cleared by the [place] FDA and  has been authorized for detection and/or diagnosis of SARS-CoV-2 by FDA under an Emergency Use Authorization (EUA). This EUA will remain  in effect (meaning this test can be used) for the duration of the COVID-19 declaration under Se ction 564(b)(1) of the Act, 21 U.S.C. section 360bbb-3(b)(1), unless  the authorization is terminated or revoked sooner.  Performed at [facility], [place] [address] [place], [place] [address]   Culture, blood (single)     Status: Abnormal   Collection Time: 12/28/2020  4:58 PM   Specimen: BLOOD  Result Value Ref Range Status   Specimen Description   Final    BLOOD RIGHT ANTECUBITAL Performed at [facility], [address], [place], [place] [address]    Special Requests   Final    BOTTLES DRAWN AEROBIC AND ANAEROBIC Blood Culture adequate volume Performed at [facility], [address] [place], [place] [address]    Culture  Setup Time   Final    GRAM POSITIVE COCCI AEROBIC BOTTLE ONLY CRITICAL RESULT CALLED TO, READ BACK BY AND VERIFIED WITH: SUSAN WATSON PHARMD AT 1705 ON 12/24/20 SNG ANAEROBIC BOTTLE ONLY GRAM POSITIVE RODS CRITICAL RESULT CALLED TO, READ BACK BY AND VERIFIED WITH: NATHAN BELUE @2236  ON 12/24/20 SKL    Culture (A)  Final    AEROCOCCUS VIRIDANS PROPIONIBACTERIUM SPECIES Standardized susceptibility testing for this organism is not available. Performed at [facility], [place] [address] [place], [place] [address]    Report Status 12/28/2020 FINAL  Final  Blood Culture ID Panel (Reflexed)  Status: None   Collection Time: 12/17/2020  4:58 PM  Result Value Ref Range Status   Enterococcus faecalis NOT DETECTED NOT DETECTED Final   Enterococcus Faecium NOT DETECTED NOT DETECTED Final   Listeria monocytogenes NOT DETECTED NOT DETECTED Final   Staphylococcus species NOT DETECTED NOT DETECTED Final   Staphylococcus aureus (BCID) NOT DETECTED NOT DETECTED Final   Staphylococcus epidermidis NOT DETECTED NOT DETECTED Final   Staphylococcus lugdunensis NOT DETECTED NOT DETECTED Final   Streptococcus species NOT DETECTED NOT DETECTED Final   Streptococcus agalactiae NOT DETECTED NOT DETECTED Final   Streptococcus pneumoniae NOT DETECTED NOT DETECTED Final   Streptococcus pyogenes NOT DETECTED NOT DETECTED  Final   A.calcoaceticus-baumannii NOT DETECTED NOT DETECTED Final   Bacteroides fragilis NOT DETECTED NOT DETECTED Final   Enterobacterales NOT DETECTED NOT DETECTED Final   Enterobacter cloacae complex NOT DETECTED NOT DETECTED Final   Escherichia coli NOT DETECTED NOT DETECTED Final   Klebsiella aerogenes NOT DETECTED NOT DETECTED Final   Klebsiella oxytoca NOT DETECTED NOT DETECTED Final   Klebsiella pneumoniae NOT DETECTED NOT DETECTED Final   Proteus species NOT DETECTED NOT DETECTED Final   Salmonella species NOT DETECTED NOT DETECTED Final   Serratia marcescens NOT DETECTED NOT DETECTED Final   Haemophilus influenzae NOT DETECTED NOT DETECTED Final   Neisseria meningitidis NOT DETECTED NOT DETECTED Final   Pseudomonas aeruginosa NOT DETECTED NOT DETECTED Final   Stenotrophomonas maltophilia NOT DETECTED NOT DETECTED Final   Candida albicans NOT DETECTED NOT DETECTED Final   Candida auris NOT DETECTED NOT DETECTED Final   Candida glabrata NOT DETECTED NOT DETECTED Final   Candida krusei NOT DETECTED NOT DETECTED Final   Candida parapsilosis NOT DETECTED NOT DETECTED Final   Candida tropicalis NOT DETECTED NOT DETECTED Final   Cryptococcus neoformans/gattii NOT DETECTED NOT DETECTED Final    Comment: Performed at [facility], [address] [place], [place] [address]  Body fluid culture     Status: None   Collection Time: 12/24/20  2:37 PM   Specimen: PATH Cytology Peritoneal fluid  Result Value Ref Range Status   Specimen Description   Final    PERITONEAL Performed at [facility], [address] [place], [place] [address]    Special Requests   Final    NONE Performed at [facility], [address] [place], [place] [address]    Gram Stain   Final    RARE WBC PRESENT,BOTH PMN AND MONONUCLEAR NO ORGANISMS SEEN    Culture   Final    NO GROWTH 3 DAYS Performed at [facility], [address] [place], [place] [address]    Report  Status 12/28/2020 FINAL  Final  Fungus Culture With Stain     Status: None (Preliminary result)   Collection Time: 12/24/20  2:37 PM   Specimen: PATH Cytology Peritoneal fluid  Result Value Ref Range Status   Fungus Stain Final report  Final    Comment: (NOTE) Performed At: [facility] [place], [place] [id number] Rush Farmer MD [id number]    Fungus (Mycology) Culture PENDING  Incomplete   Fungal Source PERITONEAL  Final    Comment: Performed at [facility], [address] [place], [place] [address]  Aerobic/Anaerobic Culture (surgical/deep wound)     Status: None (Preliminary result)   Collection Time: 12/24/20  2:37 PM   Specimen: PATH Cytology Peritoneal fluid  Result Value Ref Range Status   Specimen Description   Final  PERITONEAL Performed at [facility], [address] [place], [place] [address]    Special Requests   Final    NONE Performed at [facility], [address] [place], [place] [address]    Gram Stain   Final    FEW WBC PRESENT, PREDOMINANTLY MONONUCLEAR NO ORGANISMS SEEN    Culture   Final    NO GROWTH 3 DAYS NO ANAEROBES ISOLATED; CULTURE IN PROGRESS FOR 5 DAYS Performed at [place] [address], [place], [place] [address]    Report Status PENDING  Incomplete  Fungus Culture Result     Status: None   Collection Time: 12/24/20  2:37 PM  Result Value Ref Range Status   Result 1 Comment  Final    Comment: (NOTE) KOH/Calcofluor preparation:  no fungus observed. Performed At: [facility] [place], [place] [id number] Rush Farmer MD TZ:[id number]   MRSA PCR Screening     Status: None   Collection Time: 12/24/20 11:22 PM   Specimen: Nasopharyngeal  Result Value Ref Range Status   MRSA by PCR NEGATIVE NEGATIVE Final    Comment:        The GeneXpert MRSA Assay (FDA approved for NASAL specimens only), is one component of a comprehensive MRSA  colonization surveillance program. It is not intended to diagnose MRSA infection nor to guide or monitor treatment for MRSA infections. Performed at [facility], [address] [place], [place] [address]   CULTURE, BLOOD (ROUTINE X 2) w Reflex to ID Panel     Status: None (Preliminary result)   Collection Time: 12/28/20 10:13 AM   Specimen: BLOOD  Result Value Ref Range Status   Specimen Description BLOOD LEFT ASSIST CONTROL  Final   Special Requests   Final    BOTTLES DRAWN AEROBIC AND ANAEROBIC Blood Culture adequate volume   Culture   Final    NO GROWTH < 24 HOURS Performed at [facility], [address] [place], [place] [address]    Report Status PENDING  Incomplete  CULTURE, BLOOD (ROUTINE X 2) w Reflex to ID Panel     Status: None (Preliminary result)   Collection Time: 12/28/20 10:22 AM   Specimen: BLOOD  Result Value Ref Range Status   Specimen Description BLOOD RIGHT HAND  Final   Special Requests   Final    BOTTLES DRAWN AEROBIC AND ANAEROBIC Blood Culture adequate volume   Culture   Final    NO GROWTH < 24 HOURS Performed at [facility], [address], [place],  [address]    Report Status PENDING  Incomplete          IMAGING    DG Chest Port 1 View  Result Date: 2021/01/11 CLINICAL DATA:  Respiratory failure. EXAM: PORTABLE CHEST 1 VIEW COMPARISON:  12/26/2020. FINDINGS: Endotracheal tube, NG tube, left IJ dual-lumen catheter in stable position. Stable cardiomegaly. Diffuse bilateral pulmonary infiltrates/edema again noted. Bibasilar atelectasis again noted. Small moderate right pleural effusion again noted. Left costophrenic angle incompletely imaged. No pneumothorax. IMPRESSION: 1. Lines and tubes in stable position. 2. Stable cardiomegaly. 3. Diffuse bilateral pulmonary infiltrates/edema again noted. Bibasilar atelectasis again noted. Small moderate right pleural effusion again noted. Similar findings on prior exam.  Electronically Signed   By: Sterling   On: 01/11/2021 06:17     Nutrition Status: Nutrition Problem: Inadequate oral intake Etiology: inability to eat (pt sedated and ventilated) Signs/Symptoms: NPO status  Indwelling Urinary Catheter continued, requirement due to   Reason to continue Indwelling Urinary Catheter strict Intake/Output monitoring for hemodynamic instability   Central Line/ continued, requirement due to  Reason to continue [place] of central venous pressure or other hemodynamic parameters and poor IV access   Ventilator continued, requirement due to severe respiratory failure   Ventilator Sedation RASS 0 to -2      ASSESSMENT AND PLAN SYNOPSIS 80 y/o female with acute hypoxemic respiratory failure due to acute pulmonary edema due to AKI in the setting of newly diagnosed acute hepatic encephalopathy and advanced cirrhosis.  Severe ACUTE Hypoxic and Hypercapnic Respiratory Failure -continue Full MV support -continue Bronchodilator Therapy -Wean Fio2 and PEEP as tolerated -will perform SAT/SBT when respiratory parameters are met -VAP/VENT bundle implementation   Morbid obesity, possible OSA.   Will certainly impact respiratory mechanics, ventilator weaning Suspect will need to consider additional PEEP   ACUTE KIDNEY INJURY/Renal Failure -continue Foley Catheter-assess need -Avoid nephrotoxic agents -Follow urine output, BMP -Ensure adequate renal perfusion, optimize oxygenation -Renal dose medications     NEUROLOGY Acute toxic metabolic encephalopathy due to hypoxia and hepatic encephalopathy Lactulose as prescribed Wake up assessment pending   CARDIAC ICU monitoring  ID  -continue antibiotics as prescribed -follow up cultures Need to re-assess ABX coverage discuss with ID pharmacy  GI GI PROPHYLAXIS as indicated  DIET-->TF's as tolerated Constipation protocol as indicated  ENDO - will use ICU  hypoglycemic\Hyperglycemia protocol if indicated     ELECTROLYTES -follow labs as needed -replace as needed -pharmacy consultation and following   DVT/GI PRX ordered and assessed TRANSFUSIONS AS NEEDED MONITOR FSBS I Assessed the need for Labs I Assessed the need for Foley I Assessed the need for Central Venous Line Family Discussion when available I Assessed the need for Mobilization I made an Assessment of medications to be adjusted accordingly Safety Risk assessment completed   CASE DISCUSSED IN MULTIDISCIPLINARY ROUNDS WITH ICU TEAM  Critical Care Time devoted to patient care services described in this note is 43  minutes.   Overall, patient is critically ill, prognosis is guarded.  Patient with Multiorgan failure and at high risk for cardiac arrest and death.    Corrin Parker, M.D.  [facility]  Medical Director [place] [facility] Cardio-Pulmonary Department

## 2021-01-12 NOTE — Plan of Care (Addendum)
patient deceased today, no further care plan needed  Post mortem care completed by charge nurse, pending pick up for transport to morgue.

## 2021-01-12 NOTE — Progress Notes (Signed)
[facility]  ROUNDING NOTE   Subjective:   UOP 1374mL.   Creatinine 1.72 (2.39) (2.57) (2.28)  On phone with Daughter, Angelica Juarez.  Norepinephrine gtt  Objective:  Vital signs in last 24 hours:  Temp:  [99.8 F (37.7 C)-100.8 F (38.2 C)] 100.3 F (37.9 C) (02/15 0400) Pulse Rate:  [72-124] 102 (02/15 1200) Resp:  [18-27] 27 (02/15 1200) BP: (103-155)/(39-67) 112/44 (02/15 1200) SpO2:  [95 %-100 %] 100 % (02/15 1200) FiO2 (%):  [30 %] 30 % (02/15 0828)  Weight change:  Filed Weights   12/25/20 0309 12/26/20 0432 12/27/20 0500  Weight: 100.6 kg 105.7 kg 106.6 kg    Intake/Output: I/O last 3 completed shifts: In: 3078.5 [I.V.:581.6; Other:45; NG/GT:1800; IV Piggyback:651.9] Out: 1960 [Urine:1760; Stool:200]   Intake/Output this shift:  Total I/O In: 45 [Other:45] Out: -   Physical Exam: General: Critically ill  Head: ETT   Eyes: Pinpoint pupils  Neck: trachea midline  Lungs:  PRVC 30%  Heart: irregular  Abdomen:  Soft, not distended  Extremities:  + peripheral edema.  Neurologic: Intubated and sedated  Skin: No lesions  Access: Left IJ temp HD catheter    Basic Metabolic Panel: Recent Labs  Lab 12/25/20 0351 12/26/20 0537 12/27/20 0531 12/28/20 0622 14-Jan-2021 0531  NA 141 139 140 139 143  K 4.9 4.6 4.3 4.2 3.8  CL 104 103 105 107 109  CO2 28 27 27 25 26   GLUCOSE 102* 146* 150* 191* 195*  BUN 42* 54* 65* 72* 74*  CREATININE 1.87* 2.28* 2.57* 2.39* 1.72*  CALCIUM 9.0 8.8* 8.5* 8.6* 9.0  MG 1.8 2.1 2.4 2.5* 2.4  PHOS 3.3 2.7 3.2 3.3 2.5    Liver Function Tests: Recent Labs  Lab 12/25/20 0351 12/26/20 0537 12/27/20 0531 12/28/20 0622 January 14, 2021 0531  AST 28 30 23 23 25   ALT 13 12 12 14 13   ALKPHOS 62 61 55 63 73  BILITOT 0.8 0.6 0.6 0.7 0.8  PROT 5.5* 5.2* 4.8* 5.4* 5.5*  ALBUMIN 3.1* 2.9* 2.5* 2.7* 2.6*   No results for input(s): LIPASE, AMYLASE in the last 168 hours. Recent Labs  Lab 12/24/20 1201 12/24/20 2355  12/25/20 0351  AMMONIA 58* 33 29    CBC: Recent Labs  Lab 12/26/20 0537 12/27/20 0531 12/28/20 0622 12/28/20 1013 January 14, 2021 0531  WBC 15.9* 15.3* 24.9* 24.3* 24.5*  NEUTROABS 13.5* 12.7* 20.3* 19.7* 20.5*  HGB 10.1* 9.5* 10.4* 10.7* 11.4*  HCT 33.7* 32.2* 34.6* 36.2 35.9*  MCV 90.8 91.2 90.6 91.4 87.6  PLT 363 333 433* 434* 461*    Cardiac Enzymes: No results for input(s): CKTOTAL, CKMB, CKMBINDEX, TROPONINI in the last 168 hours.  BNP: Invalid input(s): POCBNP  CBG: Recent Labs  Lab 12/28/20 2349 January 14, 2021 0319 2021/01/14 0334 2021-01-14 0734 January 14, 2021 1153  GLUCAP 177* 162* 168* 162* 181*    Microbiology: Results for orders placed or performed during the hospital encounter of 12/19/2020  SARS CORONAVIRUS 2 (TAT 6-24 HRS) Nasopharyngeal Nasopharyngeal Swab     Status: None   Collection Time: 12/25/2020  4:04 PM   Specimen: Nasopharyngeal Swab  Result Value Ref Range Status   SARS Coronavirus 2 NEGATIVE NEGATIVE Final    Comment: (NOTE) SARS-CoV-2 target nucleic acids are NOT DETECTED.  The SARS-CoV-2 RNA is generally detectable in upper and lower respiratory specimens during the acute phase of infection. Negative results do not preclude SARS-CoV-2 infection, do not rule out co-infections with other pathogens, and should not be used as the sole  basis for treatment or other patient management decisions. Negative results must be combined with clinical observations, patient history, and epidemiological information. The expected result is Negative.  Fact Sheet for Patients: SugarRoll.be  Fact Sheet for Healthcare Providers: https://www.woods-mathews.com/  This test is not yet approved or cleared by the [place] FDA and  has been authorized for detection and/or diagnosis of SARS-CoV-2 by FDA under an Emergency Use Authorization (EUA). This EUA will remain  in effect (meaning this test can be used) for the duration of  the COVID-19 declaration under Se ction 564(b)(1) of the Act, 21 U.S.C. section 360bbb-3(b)(1), unless the authorization is terminated or revoked sooner.  Performed at [facility], [place] [address], [place], [place] [address]   Culture, blood (single)     Status: Abnormal   Collection Time: 01/08/2021  4:58 PM   Specimen: BLOOD  Result Value Ref Range Status   Specimen Description   Final    BLOOD RIGHT ANTECUBITAL Performed at [facility], [address] [place], [place] [address]    Special Requests   Final    BOTTLES DRAWN AEROBIC AND ANAEROBIC Blood Culture adequate volume Performed at [facility], [address] [place], [place] [address]    Culture  Setup Time   Final    GRAM POSITIVE COCCI AEROBIC BOTTLE ONLY CRITICAL RESULT CALLED TO, READ BACK BY AND VERIFIED WITH: SUSAN WATSON PHARMD AT 1705 ON 12/24/20 SNG ANAEROBIC BOTTLE ONLY GRAM POSITIVE RODS CRITICAL RESULT CALLED TO, READ BACK BY AND VERIFIED WITH: NATHAN BELUE @2236  ON 12/24/20 SKL    Culture (A)  Final    AEROCOCCUS VIRIDANS PROPIONIBACTERIUM SPECIES Standardized susceptibility testing for this organism is not available. Performed at [facility], [place] [address], [place], [place] [address]    Report Status 12/28/2020 FINAL  Final  Blood Culture ID Panel (Reflexed)     Status: None   Collection Time: 01/01/2021  4:58 PM  Result Value Ref Range Status   Enterococcus faecalis NOT DETECTED NOT DETECTED Final   Enterococcus Faecium NOT DETECTED NOT DETECTED Final   Listeria monocytogenes NOT DETECTED NOT DETECTED Final   Staphylococcus species NOT DETECTED NOT DETECTED Final   Staphylococcus aureus (BCID) NOT DETECTED NOT DETECTED Final   Staphylococcus epidermidis NOT DETECTED NOT DETECTED Final   Staphylococcus lugdunensis NOT DETECTED NOT DETECTED Final   Streptococcus species NOT DETECTED NOT DETECTED Final   Streptococcus agalactiae NOT DETECTED NOT DETECTED Final    Streptococcus pneumoniae NOT DETECTED NOT DETECTED Final   Streptococcus pyogenes NOT DETECTED NOT DETECTED Final   A.calcoaceticus-baumannii NOT DETECTED NOT DETECTED Final   Bacteroides fragilis NOT DETECTED NOT DETECTED Final   Enterobacterales NOT DETECTED NOT DETECTED Final   Enterobacter cloacae complex NOT DETECTED NOT DETECTED Final   Escherichia coli NOT DETECTED NOT DETECTED Final   Klebsiella aerogenes NOT DETECTED NOT DETECTED Final   Klebsiella oxytoca NOT DETECTED NOT DETECTED Final   Klebsiella pneumoniae NOT DETECTED NOT DETECTED Final   Proteus species NOT DETECTED NOT DETECTED Final   Salmonella species NOT DETECTED NOT DETECTED Final   Serratia marcescens NOT DETECTED NOT DETECTED Final   Haemophilus influenzae NOT DETECTED NOT DETECTED Final   Neisseria meningitidis NOT DETECTED NOT DETECTED Final   Pseudomonas aeruginosa NOT DETECTED NOT DETECTED Final   Stenotrophomonas maltophilia NOT DETECTED NOT DETECTED Final   Candida albicans NOT DETECTED NOT DETECTED Final   Candida auris NOT DETECTED NOT DETECTED Final   Candida glabrata NOT  DETECTED NOT DETECTED Final   Candida krusei NOT DETECTED NOT DETECTED Final   Candida parapsilosis NOT DETECTED NOT DETECTED Final   Candida tropicalis NOT DETECTED NOT DETECTED Final   Cryptococcus neoformans/gattii NOT DETECTED NOT DETECTED Final    Comment: Performed at [facility], [address] [place], [place] [address]  Body fluid culture     Status: None   Collection Time: 12/24/20  2:37 PM   Specimen: PATH Cytology Peritoneal fluid  Result Value Ref Range Status   Specimen Description   Final    PERITONEAL Performed at [facility], [address] [place], [place] [address]    Special Requests   Final    NONE Performed at [facility], [address] [place], [place] [address]    Gram Stain   Final    RARE WBC PRESENT,BOTH PMN AND MONONUCLEAR NO ORGANISMS SEEN    Culture   Final     NO GROWTH 3 DAYS Performed at [facility], [place] [address], [place], [place] [address]    Report Status 12/28/2020 FINAL  Final  Fungus Culture With Stain     Status: None (Preliminary result)   Collection Time: 12/24/20  2:37 PM   Specimen: PATH Cytology Peritoneal fluid  Result Value Ref Range Status   Fungus Stain Final report  Final    Comment: (NOTE) Performed At: [facility] [place], [place] [id number] Rush Farmer MD IW:[id number]    Fungus (Mycology) Culture PENDING  Incomplete   Fungal Source PERITONEAL  Final    Comment: Performed at [facility], [address] [place], [place] [address]  Aerobic/Anaerobic Culture (surgical/deep wound)     Status: None (Preliminary result)   Collection Time: 12/24/20  2:37 PM   Specimen: PATH Cytology Peritoneal fluid  Result Value Ref Range Status   Specimen Description   Final    PERITONEAL Performed at [facility], [address] [place], [place] [address]    Special Requests   Final    NONE Performed at [facility], [address] [place], [place] [address]    Gram Stain   Final    FEW WBC PRESENT, PREDOMINANTLY MONONUCLEAR NO ORGANISMS SEEN    Culture   Final    NO GROWTH 3 DAYS NO ANAEROBES ISOLATED; CULTURE IN PROGRESS FOR 5 DAYS Performed at [facility], [place] [address] [place], [place] [address]    Report Status PENDING  Incomplete  Fungus Culture Result     Status: None   Collection Time: 12/24/20  2:37 PM  Result Value Ref Range Status   Result 1 Comment  Final    Comment: (NOTE) KOH/Calcofluor preparation:  no fungus observed. Performed At: [facility] [place], [place] [id number] Rush Farmer MD QA:[id number]   MRSA PCR Screening     Status: None   Collection Time: 12/24/20 11:22 PM   Specimen: Nasopharyngeal  Result Value Ref Range Status   MRSA by PCR NEGATIVE NEGATIVE Final    Comment:        The  GeneXpert MRSA Assay (FDA approved for NASAL specimens only), is one component of a comprehensive MRSA colonization surveillance program. It is not intended to diagnose MRSA infection nor to guide or monitor treatment for MRSA infections. Performed at [facility], [place], [place] [address]   CULTURE, BLOOD (ROUTINE X 2) w Reflex to ID Panel     Status:  None (Preliminary result)   Collection Time: 12/28/20 10:13 AM   Specimen: BLOOD  Result Value Ref Range Status   Specimen Description BLOOD LEFT ASSIST CONTROL  Final   Special Requests   Final    BOTTLES DRAWN AEROBIC AND ANAEROBIC Blood Culture adequate volume   Culture   Final    NO GROWTH < 24 HOURS Performed at [facility], [address] [place], [place] [address]    Report Status PENDING  Incomplete  CULTURE, BLOOD (ROUTINE X 2) w Reflex to ID Panel     Status: None (Preliminary result)   Collection Time: 12/28/20 10:22 AM   Specimen: BLOOD  Result Value Ref Range Status   Specimen Description BLOOD RIGHT HAND  Final   Special Requests   Final    BOTTLES DRAWN AEROBIC AND ANAEROBIC Blood Culture adequate volume   Culture   Final    NO GROWTH < 24 HOURS Performed at [facility], [address] [place], [place] [address]    Report Status PENDING  Incomplete    Coagulation Studies: No results for input(s): LABPROT, INR in the last 72 hours.  Urinalysis: No results for input(s): COLORURINE, LABSPEC, PHURINE, GLUCOSEU, HGBUR, BILIRUBINUR, KETONESUR, PROTEINUR, UROBILINOGEN, NITRITE, LEUKOCYTESUR in the last 72 hours.  Invalid input(s): APPERANCEUR    Imaging: DG Chest Port 1 View  Result Date: January 26, 2021 CLINICAL DATA:  Respiratory failure. EXAM: PORTABLE CHEST 1 VIEW COMPARISON:  12/26/2020. FINDINGS: Endotracheal tube, NG tube, left IJ dual-lumen catheter in stable position. Stable cardiomegaly. Diffuse bilateral pulmonary infiltrates/edema again noted. Bibasilar  atelectasis again noted. Small moderate right pleural effusion again noted. Left costophrenic angle incompletely imaged. No pneumothorax. IMPRESSION: 1. Lines and tubes in stable position. 2. Stable cardiomegaly. 3. Diffuse bilateral pulmonary infiltrates/edema again noted. Bibasilar atelectasis again noted. Small moderate right pleural effusion again noted. Similar findings on prior exam. Electronically Signed   By: [place]   On: 01/26/2021 06:17     Medications:   . dexmedetomidine (PRECEDEX) IV infusion 0.3 mcg/kg/hr (01-26-21 0700)  . levETIRAcetam 500 mg (01-26-21 1145)   . mouth rinse  15 mL Mouth Rinse 10 times per day  . nystatin cream   Topical BID   fentaNYL (SUBLIMAZE) injection, ipratropium-albuterol, midazolam, ondansetron **OR** ondansetron (ZOFRAN) IV  Assessment/ Plan:  Angelica Juarez is a 80 y.o. white female with sleep apnea, seizure disorder, atrial fibrillation, congestive heart failure, hypertension, hyperlipidemia, depression, diabetes mellitus type II, COPD, polycythemia who is admitted to [facility] on 12/23/9369 for Metabolic encephalopathy [I96.78] Abdominal distension [R14.0] Hypoxia [R09.02] Other ascites [R18.8] Acute on chronic diastolic congestive heart failure ([place]) [I50.33] AKI (acute kidney injury) ([place]) [N17.9] COPD with acute exacerbation ([place]) [J44.1] Sepsis ([place]) [A41.9] Sepsis with acute hypoxic respiratory failure without septic shock, due to unspecified organism ([place]) [A41.9, R65.20, J96.01]  1. 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 Baseline creatinine of 1.03, GFR of 56 on 10/18/2020.  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  - renal  function is improving.   2. Acute respiratory failure: requiring intubation and mechanical ventilation. Secondary to pneumonia. On cefepime and vancomycin.   3. Hypotension: with sepsis: requiring vasopressors: norepinephrine. Empiric antibiotics as above: cefepime and vancomycin Blood cultures positive for Aerococcus viridans Not enough PMNs in peritoneal  fluid to diagnose with spontaneous bacterial peritonitis.   4. Anemia with renal failure: with history of polycythemia. Hemoglobin stable   LOS: 6 Savvy Peeters 15-Mar-20221:41 PM

## 2021-01-12 NOTE — Treatment Plan (Signed)
Pt moved from [place] to [place] due to a leak in the ceiling outside the room.  Patient was transported on current vent settings on the servo-I.  FiO2 was increased to 100% for transport and adjusted back to 30% once in [place].  No adverse effects.

## 2021-01-12 NOTE — Progress Notes (Addendum)
Daily Progress Note   Patient Name: Angelica Juarez       Date: 01/09/2021 DOB: 01-07-1941  Age: 80 y.o. MRN#: [id number] Attending Physician: Flora Lipps, MD Primary Care Physician: Sofie Hartigan, MD Admit Date: 12/20/2020  Reason for Consultation/Follow-up: Establishing goals of care  Subjective: Patient is resting in bed on ventilator with mitten restraints intact. Daughter Beverlee Nims and patient's significant other who is HPOA is at bedside. Discussed patient's status and multiple scenarios, none of which provide a QOL patient would be accepting of. Collins [place] states her QOL has been poor for some time. They discuss her distended abdomen making it difficult for her to do things at home. They discuss that they do not want patient to suffer. They discuss Herman's children. They requested I call Herman's son Patrick Jupiter, no answer and VM left to call Diana's phone. Beverlee Nims attempted to reach [place]. Collins Scotland and Beverlee Nims would like to proceed with full comfort care at this time. They are aware she has a prognosis of hours to days and are going to lunch. They ask for her to be extubated before they return for lunch stating they do not want to be present when the tube is removed, and that they would be back to bedside in 25 minutes.   Remained on unit for needs during preparation for and execution of extubation. Patient with accessory muscle use, abdomen is round and distended. Dilaudid infusion in place and PRN medications available.  Family arrived back to bedside just following extubation, all questions answered. Patient appears comfortable at this time.    I completed a MOST form today with HPOA Collins Scotland, and at bedside was daughter Beverlee Nims, and the signed original was placed in the chart. A photocopy was placed in the chart  to be scanned into EMR. The patient outlined their wishes for the following treatment decisions:  Cardiopulmonary Resuscitation: Do Not Attempt Resuscitation (DNR/No CPR)  Medical Interventions: Comfort Measures: Keep clean, warm, and dry. Use medication by any route, positioning, wound care, and other measures to relieve pain and suffering. Use oxygen, suction and manual treatment of airway obstruction as needed for comfort. Do not transfer to the hospital unless comfort needs cannot be met in current location.  Antibiotics: No antibiotics (use other measures to relieve symptoms)  IV Fluids: No IV fluids (provide other measures  to ensure comfort)  Feeding Tube: No feeding tube     Length of Stay: 6  Current Medications: Scheduled Meds:  . mouth rinse  15 mL Mouth Rinse 10 times per day  . nystatin cream   Topical BID  . sodium chloride flush  3 mL Intravenous Q12H    Continuous Infusions: . dexmedetomidine (PRECEDEX) IV infusion 0.3 mcg/kg/hr (14-Jan-2021 0700)  . levETIRAcetam 500 mg (01-14-2021 1145)    PRN Meds: acetaminophen **OR** acetaminophen, antiseptic oral rinse, fentaNYL (SUBLIMAZE) injection, glycopyrrolate **OR** glycopyrrolate **OR** glycopyrrolate, haloperidol **OR** haloperidol **OR** haloperidol lactate, ipratropium-albuterol, midazolam, ondansetron **OR** ondansetron (ZOFRAN) IV, ondansetron **OR** ondansetron (ZOFRAN) IV, polyvinyl alcohol, sodium chloride flush  Physical Exam Constitutional:      Comments: On ventilator             Vital Signs: BP (!) 112/44   Pulse (!) 102   Temp 100.3 F (37.9 C) (Axillary)   Resp (!) 27   Ht 5' 7" (1.702 m)   Wt 106.6 kg   SpO2 100%   BMI 36.81 kg/m  SpO2: SpO2: 100 % O2 Device: O2 Device: Ventilator O2 Flow Rate: O2 Flow Rate (L/min): 15 L/min  Intake/output summary:   Intake/Output Summary (Last 24 hours) at 2021/01/14 1356 Last data filed at Jan 14, 2021 6712 Gross per 24 hour  Intake 2039.47 ml  Output 1475 ml   Net 564.47 ml   LBM: Last BM Date: 2021/01/14 Baseline Weight: Weight: 104.5 kg Most recent weight: Weight: 106.6 kg     Patient Active Problem List   Diagnosis Date Noted  . Anasarca 12/24/2020  . Acute metabolic encephalopathy 45/80/9983  . Hyperammonemia ([place]) 12/24/2020  . Atypical pneumonia 12/24/2020  . Acute on chronic respiratory failure with hypoxia and hypercapnia ([place]) 12/24/2020  . AKI (acute kidney injury) ([place])   . Severe sepsis ([place]) 01/08/2021  . Trauma   . Cirrhosis of liver with ascites ([place])   . Symptomatic anemia 10/15/2020  . Macrocytic anemia 10/14/2020  . Acute blood loss anemia 10/14/2020  . Chronic respiratory failure ([place]) 10/14/2020  . Chronic kidney disease   . Hyperkalemia   . Hematoma of right chest wall   . Acute on chronic diastolic CHF (congestive heart failure) ([place]) 09/29/2020  . Cellulitis of left leg 09/28/2020  . Rash and nonspecific skin eruption 09/28/2020  . Left leg cellulitis 09/28/2020  . Normocytic anemia 06/11/2020  . COPD with acute exacerbation ([place]) 06/11/2020  . Chronic diarrhea of unknown origin   . Acute left flank pain 01/15/2020  . Hypokalemia 11/14/2019  . Proteinuria 08/09/2019  . Acute on chronic congestive heart failure ([place]) 07/09/2019  . Nonrheumatic mitral valve regurgitation 07/08/2019  . Other persistent atrial fibrillation ([place]) 07/08/2019  . Diarrhea 11/18/2018  . Nausea without vomiting 11/18/2018  . Goals of care, counseling/discussion 09/12/2018  . 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 History of repair of right rotator cuff 08/25/2016  . Acute respiratory failure with hypoxia and hypercapnia ([place]) 08/06/2016  . Nontraumatic tear of right rotator cuff 04/21/2016  . Atypical chest pain 04/07/2016  . Impingement syndrome of right shoulder 12/29/2015  . Primary osteoarthritis of first carpometacarpal joint of left hand 10/27/2015  . Right shoulder pain 10/27/2015  . OSA on CPAP 10/14/2015  . COPD  (chronic obstructive pulmonary disease) ([place]) 07/13/2015  . Type 2 diabetes mellitus with microalbuminuria, without long-term current use of insulin ([place]) 07/13/2015  . Hypertension 03/05/2015  . Difficulty sleeping 11/26/2014  . Chronic kidney disease, stage 3a  07/31/2014  . Seizure disorder ([place]) 07/31/2014  . History of colonic polyps 05/21/2014  . Hyperlipidemia, unspecified 04/30/2014    Palliative Care Assessment & Plan     Recommendations/Plan:  Shift to full comfort care.     Code Status:    Code Status Orders  (From admission, onward)         Start     Ordered   31-Dec-2020 1343  Do not attempt resuscitation (DNR)  Continuous       Question Answer Comment  In the event of cardiac or respiratory ARREST Do not call a "code blue"   In the event of cardiac or respiratory ARREST Do not perform Intubation, CPR, defibrillation or ACLS   In the event of cardiac or respiratory ARREST Use medication by any route, position, wound care, and other measures to relive pain and suffering. May use oxygen, suction and manual treatment of airway obstruction as needed for comfort.   Comments MOST form in chart.      2020-12-31 1355        Code Status History    Date Active Date Inactive Code Status Order ID Comments User Context   12/26/2020 1154 12-31-20 1355 DNR [id number]  Flora Lipps, MD Inpatient   12/26/2020 1152 12/26/2020 1153 DNR [id number]  Flora Lipps, MD Inpatient   12/24/2020 1626 12/26/2020 1152 Full Code [id number]  Ezekiel Slocumb, DO Inpatient   01/10/2021 1810 12/24/2020 1626 Partial Code [id number]  Cox, Amy Delane Ginger, DO ED   12/22/2020 1801 12/18/2020 1810 Full Code [id number]  Cox, Amy N, DO ED   10/14/2020 1900 10/20/2020 0009 Full Code [id number]  Collier Bullock, MD ED   09/28/2020 1955 10/06/2020 2131 Full Code [id number]  Para Skeans, MD ED   06/11/2020 2048 06/13/2020 2012 Full Code [id number]  Vianne Bulls, MD ED   06/01/2019 1753 06/03/2019 1801 Full Code [id number]  Mayo,  Pete Pelt, MD Inpatient   08/06/2016 1639 08/08/2016 1545 Full Code [id number]  Henreitta Leber, MD Inpatient   Advance Care Planning Activity    Advance Directive Documentation   Flowsheet Row Most Recent Value  Type of Advance Directive Healthcare Power of Attorney  Pre-existing out of facility DNR order (yellow form or pink MOST form) --  "MOST" Form in Place? --       Prognosis:   Hours - Days    Care plan was discussed with CCM and RT  Thank you for allowing the Palliative Medicine Team to assist in the care of this patient.   Time In: 12:30 Time Out: 2:20 Total Time 1 hour 50 min yes       Greater than 50%  of this time was spent counseling and coordinating care related to the above assessment and plan.  Asencion Gowda, NP  Please contact Palliative Medicine Team phone at [phone] for questions and concerns.

## 2021-01-12 NOTE — Death Summary Note (Addendum)
DEATH SUMMARY   Patient Details  Name: Angelica Juarez MRN: [id number] DOB: 1941-03-06  Admission/Discharge Information   Admit Date:  05-Jan-2021  Date of Death:   01/11/21   Time of Death:  18:15  Length of Stay: 6  Referring Physician: Sofie Hartigan, MD   Reason(s) for Hospitalization  Liver failure  AEROCOCCUS VIRIDANS septic shock, present on admission      Diagnoses  Preliminary cause of death: Liver Cirrhosis, Ischemic Cardiomyopathy, DM Secondary Diagnoses (including complications and co-morbidities):  Principal Problem:   Severe sepsis ([place]) Active Problems:   Acute respiratory failure with hypoxia and hypercapnia (HCC)   COPD (chronic obstructive pulmonary disease) ([place])   Chronic kidney disease, stage 3a   Hypertension   Hyperlipidemia, unspecified   OSA on CPAP   Type 2 diabetes mellitus with microalbuminuria, without long-term current use of insulin (HCC)   Goals of care, counseling/discussion   Other persistent atrial fibrillation (HCC)   Chronic diarrhea of unknown origin   Acute on chronic diastolic CHF (congestive heart failure) (HCC)   Cirrhosis of liver with ascites ([place])   AKI (acute kidney injury) ([place])   Anasarca   Acute metabolic encephalopathy   Hyperammonemia (HCC)   Atypical pneumonia   Acute on chronic respiratory failure with hypoxia and hypercapnia ([place])   80 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 fib not on anticoagulation due to history of retroperitoneal bleed, HFpEF, hypertension, cirrhosis  Admitted with acute respiratory failure secondary to pneumonia, multiorgan failure with AKI, liver failure, hepatorenal syndrome, developed worsening respiratory failure and required intubation for airway protection and work of breathing. Has developed kidney failure and has required hemodialysis since admission.  Significant Diagnostic Tests:  CT head 2/9-no acute findings.  Echocardiogram  2/10-LVEF 60-65%, normal RV systolic function, mild enlargement of RV size  Micro Data:  Blood culture 2/9-GPC, GPR AEROCOCCUS VIRIDANS Antimicrobials:  Ceftriaxone 2/9-2/11 Azithromycin 2/9-2/11  Vanco 2/11>> Cefepime 2/11>>    Significant Hospital Events:   2/9Admitted for Pneumonia 2/10 US paracentesis 2L yellow fluid removed 2/10 emergently intubatedafter shefailedBiPAP, HD catheter placed and dialyzed 2/11 remains intubated, multiorgan failure 2/12 remains on vent, failure to wean, +encephlopathy, DNR status 2/13 remains critically ill   GOALS OF CARE DISCUSSION  The Clinical status was relayed to family in detail.  Updated and notified of patients medical condition.  Patient remains unresponsive and will not open eyes to command.    Patient is having a weak cough and struggling to remove secretions.   Patient with increased WOB and using accessory muscles to breathe Explained to family course of therapy and the modalities     Patient with Progressive multiorgan failure with a very high probablity of a very minimal chance of meaningful recovery despite all aggressive and optimal medical therapy. Patient is in the Dying  Process associated with Suffering.  Family understands the situation.  They have consented and agreed to DNR/DNI and would like to proceed with Comfort care measures.  Family are satisfied with Plan of action and management. All questions answered       Pertinent Labs and Studies  Significant Diagnostic Studies DG Abd 1 View  Result Date: 12/24/2020 CLINICAL DATA:  80 year old female with NG placement. EXAM: ABDOMEN - 1 VIEW COMPARISON:  CT abdomen pelvis dated 01/16/2020. FINDINGS: Enteric tube with tip and side-port in the left upper abdomen, likely in the proximal stomach. No bowel dilatation. Multiple surgical clips noted over the abdomen. There is cardiomegaly.  Bibasilar densities, likely combination of pleural effusion  and associated atelectasis or infiltrate. There is atherosclerotic calcification of the aorta. No acute osseous pathology. IMPRESSION: Enteric tube with tip in the proximal stomach. Electronically Signed   By: Anner Crete M.D.   On: 12/24/2020 22:06   CT Head Wo Contrast  Result Date: 12/25/2020 CLINICAL DATA:  Mental status change, unknown cause; altered, on Eliquis, evaluate ICH. EXAM: CT HEAD WITHOUT CONTRAST TECHNIQUE: Contiguous axial images were obtained from the base of the skull through the vertex without intravenous contrast. COMPARISON:  No pertinent prior exams available for comparison. FINDINGS: Brain: Mild cerebral and cerebellar atrophy. There is no acute intracranial hemorrhage. No demarcated cortical infarct. No extra-axial fluid collection. No evidence of intracranial mass. No midline shift. Vascular: No hyperdense vessel.  Atherosclerotic calcifications. Skull: Normal. Negative for fracture or focal lesion. Sinuses/Orbits: Visualized orbits show no acute finding. Small calcification along the posterior aspect of the left globe. No significant paranasal sinus disease at the imaged levels. IMPRESSION: No evidence of acute intracranial abnormality. Mild generalized atrophy of the brain. 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 Electronically Signed   By: Kellie Simmering DO   On: 12/17/2020 16:43   US RENAL  Result Date: 12/25/2020 CLINICAL DATA:  Acute kidney injury. EXAM: RENAL / URINARY TRACT ULTRASOUND COMPLETE COMPARISON:  CT abdomen pelvis, 01/16/2020 FINDINGS: Right Kidney: Renal measurements: 10.2 x 6.1 x 4.0 cm = volume: 130.8 mL. Echogenicity within normal limits. No mass or hydronephrosis visualized. Left Kidney: Renal measurements: 11.2 x 6.3 x 7.1 cm = volume: 263.0 mL. Echogenicity within normal limits. No mass or hydronephrosis visualized. Bladder: Not visualized.  Decompressed with a Foley catheter. Other: Small amount of  ascites. IMPRESSION: 1. Somewhat limited exam due to body habitus. Kidneys appear within normal limits. No hydronephrosis. 2. Bladder not visualized. 3. Small amount of ascites Electronically Signed   By: Lajean Manes M.D.   On: 12/25/2020 18:41   US Paracentesis  Result Date: 12/24/2020 INDICATION: Abdominal distension.  Evaluate for ascites and paracentesis. EXAM: ULTRASOUND GUIDED  PARACENTESIS MEDICATIONS: None. COMPLICATIONS: None immediate. PROCEDURE: Patient has altered mental status and telephone consent was obtained from the patient's significant other. Initial ultrasound scanning demonstrates a moderate amount of ascites identified in the left abdomen. The left abdomen was prepped and draped in the usual sterile fashion. 1% lidocaine was used for local anesthesia. Following this, a 6 Fr Safe-T-Centesis catheter was introduced. An ultrasound image was saved for documentation purposes. The paracentesis was performed. The catheter was removed and a dressing was applied. The patient tolerated the procedure well without immediate post procedural complication. Patient received post-procedure intravenous albumin; see nursing notes for details. FINDINGS: A total of approximately 2 L of dark yellow fluid was removed. Samples were sent to the laboratory as requested by the clinical team. IMPRESSION: Successful ultrasound-guided paracentesis yielding 2 liters of peritoneal fluid. Electronically Signed   By: Markus Daft M.D.   On: 12/24/2020 15:27   DG Chest Port 1 View  Result Date: 2021-01-02 CLINICAL DATA:  Respiratory failure. EXAM: PORTABLE CHEST 1 VIEW COMPARISON:  12/26/2020. FINDINGS: Endotracheal tube, NG tube, left IJ dual-lumen catheter in stable position. Stable cardiomegaly. Diffuse bilateral pulmonary infiltrates/edema again noted. Bibasilar atelectasis again noted. Small moderate right pleural effusion again noted. Left costophrenic angle incompletely imaged. No pneumothorax. IMPRESSION: 1.  Lines and tubes in stable position. 2. Stable cardiomegaly. 3. Diffuse bilateral pulmonary infiltrates/edema again noted. Bibasilar atelectasis  again noted. Small moderate right pleural effusion again noted. Similar findings on prior exam. Electronically Signed   By: [place]   On: 2021-01-02 06:17   DG Chest Port 1 View  Result Date: 12/26/2020 CLINICAL DATA:  Acute respiratory failure. EXAM: PORTABLE CHEST 1 VIEW COMPARISON:  Chest x-ray dated December 24, 2020. FINDINGS: Unchanged endotracheal and enteric tubes. Unchanged left internal central venous catheter. Unchanged mild cardiomegaly. Slightly improved diffuse interstitial thickening. Unchanged layering bilateral pleural effusions and bibasilar atelectasis. No pneumothorax. No acute osseous abnormality. IMPRESSION: 1. Slightly improved pulmonary edema. 2. Unchanged layering bilateral pleural effusions and bibasilar atelectasis. Electronically Signed   By: Titus Dubin M.D.   On: 12/26/2020 07:19   Portable Chest x-ray  Result Date: 12/24/2020 CLINICAL DATA:  Confirm endotracheal tube and central line placement. EXAM: PORTABLE CHEST 1 VIEW COMPARISON:  Chest radiograph December 23, 2020 FINDINGS: Endotracheal tube with tip overlying the midthoracic trachea. Nasogastric tube coursing below the diaphragm with tip obscured by collimation. Left IJ central venous catheter with tip overlying the SVC. Similar cardiomegaly. Aortic atherosclerosis. Increased diffuse interstitial opacities. Similar small bilateral pleural effusions. No pneumothorax the visualized skeletal structures are unchanged. IMPRESSION: 1. Endotracheal tube with tip overlying the midthoracic trachea. 2. New left IJ central venous catheter with tip overlying the SVC. No pneumothorax. 3. Increased diffuse interstitial opacities suggesting pulmonary edema. Similar small bilateral pleural effusions. Electronically Signed   By: Dahlia Bailiff MD   On: 12/24/2020 19:03   DG Chest  Portable 1 View  Result Date: 12/25/2020 CLINICAL DATA:  COPD/CHF exacerbation EXAM: PORTABLE CHEST 1 VIEW COMPARISON:  09/28/2020 FINDINGS: Background emphysema. Interstitial prominence with patchy increased density bilaterally. Small bilateral pleural effusions with bibasilar atelectasis/consolidation. Stable cardiomegaly. No pneumothorax. IMPRESSION: Emphysema likely with superimposed edema or atypical pneumonia. Small bilateral pleural effusions. Electronically Signed   By: Macy Mis M.D.   On: 01/06/2021 16:18   ECHOCARDIOGRAM COMPLETE  Result Date: 12/24/2020    ECHOCARDIOGRAM REPORT   Patient Name:   ROSHANNA CIMINO Freehling Date of Exam: 12/24/2020 Medical Rec #:  [id number]   Height:       67.0 in Accession #:    [id number]  Weight:       230.4 lb Date of Birth:  01-21-1941   BSA:          2.148 m Patient Age:    89 years    BP:           137/65 mmHg Patient Gender: F           HR:           115 bpm. Exam Location:  [facility] Procedure: 2D Echo, Color Doppler and Cardiac Doppler Indications:     I50.9 Congestive Heart Failure  History:         Patient has prior history of Echocardiogram examinations. COPD                  and CKD; Risk Factors:Sleep Apnea, Hypertension, Dyslipidemia                  and Diabetes.  Sonographer:     Charmayne Sheer RDCS (AE) Referring Phys:  [id number] AMY N COX Diagnosing Phys: Bartholome Bill MD  Sonographer Comments: Suboptimal subcostal window. Image acquisition challenging due to COPD. IMPRESSIONS  1. Left ventricular ejection fraction, by estimation, is 60 to 65%. The left ventricle has normal function. The left ventricle has no regional wall motion abnormalities. Left ventricular diastolic parameters  were normal.  2. Right ventricular systolic function is normal. The right ventricular size is mildly enlarged.  3. Left atrial size was mildly dilated.  4. Right atrial size was moderately dilated.  5. The mitral valve is grossly normal. Mild to moderate mitral valve regurgitation.  6.  Tricuspid valve regurgitation is moderate.  7. The aortic valve is calcified. Aortic valve regurgitation is trivial. FINDINGS  Left Ventricle: Left ventricular ejection fraction, by estimation, is 60 to 65%. The left ventricle has normal function. The left ventricle has no regional wall motion abnormalities. The left ventricular internal cavity size was normal in size. There is  no left ventricular hypertrophy. Left ventricular diastolic parameters were normal. Right Ventricle: The right ventricular size is mildly enlarged. No increase in right ventricular wall thickness. Right ventricular systolic function is normal. Left Atrium: Left atrial size was mildly dilated. Right Atrium: Right atrial size was moderately dilated. Pericardium: There is no evidence of pericardial effusion. Mitral Valve: The mitral valve is grossly normal. Mild to moderate mitral valve regurgitation. MV peak gradient, 12.2 mmHg. The mean mitral valve gradient is 5.0 mmHg. Tricuspid Valve: The tricuspid valve is not well visualized. Tricuspid valve regurgitation is moderate. Aortic Valve: The aortic valve is calcified. Aortic valve regurgitation is trivial. Aortic valve mean gradient measures 8.0 mmHg. Aortic valve peak gradient measures 12.4 mmHg. Aortic valve area, by VTI measures 2.12 cm. Pulmonic Valve: The pulmonic valve was not well visualized. Pulmonic valve regurgitation is trivial. Aorta: The aortic root is normal in size and structure. IAS/Shunts: No atrial level shunt detected by color flow Doppler.  LEFT VENTRICLE PLAX 2D LVIDd:         4.80 cm  Diastology LVIDs:         3.10 cm  LV e' medial:    6.53 cm/s LV PW:         0.90 cm  LV E/e' medial:  20.2 LV IVS:        1.00 cm  LV e' lateral:   12.80 cm/s LVOT diam:     1.80 cm  LV E/e' lateral: 10.3 LV SV:         58 LV SV Index:   27 LVOT Area:     2.54 cm  RIGHT VENTRICLE RV Basal diam:  3.70 cm RV Mid diam:    4.50 cm TAPSE (M-mode): 1.7 cm LEFT ATRIUM             Index        RIGHT ATRIUM           Index LA diam:        4.90 cm 2.28 cm/m  RA Area:     28.10 cm LA Vol (A2C):   66.1 ml 30.78 ml/m RA Volume:   95.40 ml  44.42 ml/m LA Vol (A4C):   58.0 ml 27.01 ml/m LA Biplane Vol: 64.9 ml 30.22 ml/m  AORTIC VALVE                    PULMONIC VALVE AV Area (Vmax):    2.15 cm     PV Vmax:       1.34 m/s AV Area (Vmean):   1.82 cm     PV Vmean:      81.200 cm/s AV Area (VTI):     2.12 cm     PV VTI:        0.179 m AV Vmax:  176.00 cm/s  PV Peak grad:  7.2 mmHg AV Vmean:          130.000 cm/s PV Mean grad:  3.0 mmHg AV VTI:            0.273 m AV Peak Grad:      12.4 mmHg AV Mean Grad:      8.0 mmHg LVOT Vmax:         149.00 cm/s LVOT Vmean:        93.200 cm/s LVOT VTI:          0.227 m LVOT/AV VTI ratio: 0.83  AORTA Ao Root diam: 2.80 cm MITRAL VALVE                TRICUSPID VALVE MV Area (PHT): 4.09 cm     TR Peak grad:   35.8 mmHg MV Area VTI:   1.93 cm     TR Vmax:        299.00 cm/s MV Peak grad:  12.2 mmHg MV Mean grad:  5.0 mmHg     SHUNTS MV Vmax:       1.75 m/s     Systemic VTI:  0.23 m MV Vmean:      103.0 cm/s   Systemic Diam: 1.80 cm MV Decel Time: 186 msec MV E velocity: 132.00 cm/s Bartholome Bill MD Electronically signed by Bartholome Bill MD Signature Date/Time: 12/24/2020/1:21:54 PM    Final     Microbiology Recent Results (from the past 240 hour(s))  SARS CORONAVIRUS 2 (TAT 6-24 HRS) Nasopharyngeal Nasopharyngeal Swab     Status: None   Collection Time: 12/22/2020  4:04 PM   Specimen: Nasopharyngeal Swab  Result Value Ref Range Status   SARS Coronavirus 2 NEGATIVE NEGATIVE Final    Comment: (NOTE) SARS-CoV-2 target nucleic acids are NOT DETECTED.  The SARS-CoV-2 RNA is generally detectable in upper and lower respiratory specimens during the acute phase of infection. Negative results do not preclude SARS-CoV-2 infection, do not rule out co-infections with other pathogens, and should not be used as the sole basis for treatment or other patient  management decisions. Negative results must be combined with clinical observations, patient history, and epidemiological information. The expected result is Negative.  Fact Sheet for Patients: SugarRoll.be  Fact Sheet for Healthcare Providers: https://www.woods-mathews.com/  This test is not yet approved or cleared by the [place] FDA and  has been authorized for detection and/or diagnosis of SARS-CoV-2 by FDA under an Emergency Use Authorization (EUA). This EUA will remain  in effect (meaning this test can be used) for the duration of the COVID-19 declaration under Se ction 564(b)(1) of the Act, 21 U.S.C. section 360bbb-3(b)(1), unless the authorization is terminated or revoked sooner.  Performed at [facility], [place] [address] [place], [place] [address]   Culture, blood (single)     Status: Abnormal   Collection Time: 12/31/2020  4:58 PM   Specimen: BLOOD  Result Value Ref Range Status   Specimen Description   Final    BLOOD RIGHT ANTECUBITAL Performed at [facility], [address] [place], [place] [address]    Special Requests   Final    BOTTLES DRAWN AEROBIC AND ANAEROBIC Blood Culture adequate volume Performed at [facility], [address] [place], [place] [address]    Culture  Setup Time   Final    GRAM POSITIVE COCCI AEROBIC BOTTLE ONLY CRITICAL RESULT CALLED TO, READ BACK BY AND VERIFIED WITH: SUSAN WATSON PHARMD AT 0109  ON 12/24/20 SNG ANAEROBIC BOTTLE ONLY GRAM POSITIVE RODS CRITICAL RESULT CALLED TO, READ BACK BY AND VERIFIED WITH: NATHAN BELUE @2236  ON 12/24/20 SKL    Culture (A)  Final    AEROCOCCUS VIRIDANS PROPIONIBACTERIUM SPECIES Standardized susceptibility testing for this organism is not available. Performed at [facility], [place] [address] [place], [place] [address]    Report Status 12/28/2020 FINAL  Final  Blood Culture ID Panel (Reflexed)     Status: None    Collection Time: 12/20/2020  4:58 PM  Result Value Ref Range Status   Enterococcus faecalis NOT DETECTED NOT DETECTED Final   Enterococcus Faecium NOT DETECTED NOT DETECTED Final   Listeria monocytogenes NOT DETECTED NOT DETECTED Final   Staphylococcus species NOT DETECTED NOT DETECTED Final   Staphylococcus aureus (BCID) NOT DETECTED NOT DETECTED Final   Staphylococcus epidermidis NOT DETECTED NOT DETECTED Final   Staphylococcus lugdunensis NOT DETECTED NOT DETECTED Final   Streptococcus species NOT DETECTED NOT DETECTED Final   Streptococcus agalactiae NOT DETECTED NOT DETECTED Final   Streptococcus pneumoniae NOT DETECTED NOT DETECTED Final   Streptococcus pyogenes NOT DETECTED NOT DETECTED Final   A.calcoaceticus-baumannii NOT DETECTED NOT DETECTED Final   Bacteroides fragilis NOT DETECTED NOT DETECTED Final   Enterobacterales NOT DETECTED NOT DETECTED Final   Enterobacter cloacae complex NOT DETECTED NOT DETECTED Final   Escherichia coli NOT DETECTED NOT DETECTED Final   Klebsiella aerogenes NOT DETECTED NOT DETECTED Final   Klebsiella oxytoca NOT DETECTED NOT DETECTED Final   Klebsiella pneumoniae NOT DETECTED NOT DETECTED Final   Proteus species NOT DETECTED NOT DETECTED Final   Salmonella species NOT DETECTED NOT DETECTED Final   Serratia marcescens NOT DETECTED NOT DETECTED Final   Haemophilus influenzae NOT DETECTED NOT DETECTED Final   Neisseria meningitidis NOT DETECTED NOT DETECTED Final   Pseudomonas aeruginosa NOT DETECTED NOT DETECTED Final   Stenotrophomonas maltophilia NOT DETECTED NOT DETECTED Final   Candida albicans NOT DETECTED NOT DETECTED Final   Candida auris NOT DETECTED NOT DETECTED Final   Candida glabrata NOT DETECTED NOT DETECTED Final   Candida krusei NOT DETECTED NOT DETECTED Final   Candida parapsilosis NOT DETECTED NOT DETECTED Final   Candida tropicalis NOT DETECTED NOT DETECTED Final   Cryptococcus neoformans/gattii NOT DETECTED NOT DETECTED Final     Comment: Performed at [facility], [address] [place], [place] [address]  Body fluid culture     Status: None   Collection Time: 12/24/20  2:37 PM   Specimen: PATH Cytology Peritoneal fluid  Result Value Ref Range Status   Specimen Description   Final    PERITONEAL Performed at [facility], [address] [place], [place] [address]    Special Requests   Final    NONE Performed at [facility], [address] [place], [place] [address]    Gram Stain   Final    RARE WBC PRESENT,BOTH PMN AND MONONUCLEAR NO ORGANISMS SEEN    Culture   Final    NO GROWTH 3 DAYS Performed at [facility], [address], [place], [place] [address]    Report Status 12/28/2020 FINAL  Final  Fungus Culture With Stain     Status: None (Preliminary result)   Collection Time: 12/24/20  2:37 PM   Specimen: PATH Cytology Peritoneal fluid  Result Value Ref Range Status   Fungus Stain Final report  Final    Comment: (NOTE) Performed At: [facility] [address]  [place], [place] [id number] Rush Farmer MD XQ:[id number]    Fungus (Mycology) Culture PENDING  Incomplete   Fungal Source PERITONEAL  Final    Comment: Performed at [facility], [address] [place], [place] [address]  Aerobic/Anaerobic Culture (surgical/deep wound)     Status: None (Preliminary result)   Collection Time: 12/24/20  2:37 PM   Specimen: PATH Cytology Peritoneal fluid  Result Value Ref Range Status   Specimen Description   Final    PERITONEAL Performed at [facility], [address] [place], [place] [address]    Special Requests   Final    NONE Performed at [facility], [address] [place], [place] [address]    Gram Stain   Final    FEW WBC PRESENT, PREDOMINANTLY MONONUCLEAR NO ORGANISMS SEEN    Culture   Final    NO GROWTH 4 DAYS NO ANAEROBES ISOLATED; CULTURE IN PROGRESS FOR 5 DAYS Performed at [place] [address], [place], [place] [address]    Report Status PENDING  Incomplete  Fungus Culture Result     Status: None   Collection Time: 12/24/20  2:37 PM  Result Value Ref Range Status   Result 1 Comment  Final    Comment: (NOTE) KOH/Calcofluor preparation:  no fungus observed. Performed At: [facility] [place], [place] [id number] Rush Farmer MD IN:[id number]   MRSA PCR Screening     Status: None   Collection Time: 12/24/20 11:22 PM   Specimen: Nasopharyngeal  Result Value Ref Range Status   MRSA by PCR NEGATIVE NEGATIVE Final    Comment:        The GeneXpert MRSA Assay (FDA approved for NASAL specimens only), is one component of a comprehensive MRSA colonization surveillance program. It is not intended to diagnose MRSA infection nor to guide or monitor treatment for MRSA infections. Performed at [facility], [address] [place], [place] [address]   CULTURE, BLOOD (ROUTINE X 2) w Reflex to ID Panel     Status: None (Preliminary result)   Collection Time: 12/28/20 10:13 AM   Specimen: BLOOD  Result Value Ref Range Status   Specimen Description BLOOD LEFT ASSIST CONTROL  Final   Special Requests   Final    BOTTLES DRAWN AEROBIC AND ANAEROBIC Blood Culture adequate volume   Culture   Final    NO GROWTH < 24 HOURS Performed at [facility], [address] [place], [place] [address]    Report Status PENDING  Incomplete  CULTURE, BLOOD (ROUTINE X 2) w Reflex to ID Panel     Status: None (Preliminary result)   Collection Time: 12/28/20 10:22 AM   Specimen: BLOOD  Result Value Ref Range Status   Specimen Description BLOOD RIGHT HAND  Final   Special Requests   Final    BOTTLES DRAWN AEROBIC AND ANAEROBIC Blood Culture adequate volume   Culture   Final    NO GROWTH < 24 HOURS Performed at [facility] [address], [place], [place] [address]    Report Status PENDING  Incomplete    Lab Basic Metabolic  Panel: Recent Labs  Lab 12/25/20 0351 12/26/20 0537 12/27/20 0531 12/28/20 0622 31-Dec-2020 0531  NA 141 139 140 139 143  K 4.9 4.6 4.3 4.2 3.8  CL 104 103 105 107 109  CO2 28 27 27 25 26   GLUCOSE 102* 146* 150* 191* 195*  BUN 42* 54* 65* 72* 74*  CREATININE 1.87* 2.28* 2.57* 2.39* 1.72*  CALCIUM 9.0 8.8* 8.5* 8.6* 9.0  MG 1.8 2.1 2.4 2.5* 2.4  PHOS 3.3 2.7 3.2 3.3 2.5   Liver Function Tests: Recent Labs  Lab 12/25/20 0351 12/26/20 0537 12/27/20 0531 12/28/20 0622 01-12-21 0531  AST 28 30 23 23 25   ALT 13 12 12 14 13   ALKPHOS 62 61 55 63 73  BILITOT 0.8 0.6 0.6 0.7 0.8  PROT 5.5* 5.2* 4.8* 5.4* 5.5*  ALBUMIN 3.1* 2.9* 2.5* 2.7* 2.6*   No results for input(s): LIPASE, AMYLASE in the last 168 hours. Recent Labs  Lab 12/24/20 1201 12/24/20 2355 12/25/20 0351  AMMONIA 58* 33 29   CBC: Recent Labs  Lab 12/26/20 0537 12/27/20 0531 12/28/20 0622 12/28/20 1013 Jan 12, 2021 0531  WBC 15.9* 15.3* 24.9* 24.3* 24.5*  NEUTROABS 13.5* 12.7* 20.3* 19.7* 20.5*  HGB 10.1* 9.5* 10.4* 10.7* 11.4*  HCT 33.7* 32.2* 34.6* 36.2 35.9*  MCV 90.8 91.2 90.6 91.4 87.6  PLT 363 333 433* 434* 461*   Cardiac Enzymes: No results for input(s): CKTOTAL, CKMB, CKMBINDEX, TROPONINI in the last 168 hours. Sepsis Labs: Recent Labs  Lab 12/31/2020 1604 12/24/20 0626 12/24/20 1620 12/25/20 0351 12/26/20 0537 12/27/20 0531 12/28/20 0622 12/28/20 1013 01/12/21 0531  PROCALCITON  --   --  0.24 0.22 0.43  --   --   --   --   WBC 23.1*   < >  --  17.6* 15.9* 15.3* 24.9* 24.3* 24.5*  LATICACIDVEN 1.8  --   --   --   --   --   --   --   --    < > = values in this interval not displayed.     Flora Lipps 01-12-2021, 6:17 PM

## 2021-01-12 DEATH — deceased

## 2021-01-25 LAB — FUNGUS CULTURE RESULT

## 2021-01-25 LAB — FUNGUS CULTURE WITH STAIN

## 2021-01-25 LAB — FUNGAL ORGANISM REFLEX

## 2021-01-29 ENCOUNTER — Telehealth: Payer: Self-pay

## 2021-01-29 NOTE — Telephone Encounter (Signed)
Confirmation of verbal order for home medical equipment signed and placed in [facility] patient folder. Loma Sousa

## 2021-02-08 ENCOUNTER — Ambulatory Visit: Payer: Medicare Other | Admitting: Dermatology

## 2021-04-26 LAB — FUNGUS CULTURE, BLOOD
Culture: NO GROWTH
Special Requests: ADEQUATE

## 2022-02-07 IMAGING — MG DIGITAL SCREENING BILAT W/ TOMO W/ CAD
8 of 14 series · 8 of 40 positions shown · non-contrast
Comparison: Previous exam(s).

CLINICAL DATA: Screening.

EXAM:
DIGITAL SCREENING BILATERAL MAMMOGRAM WITH TOMO AND CAD

[R MLO synth-2D]
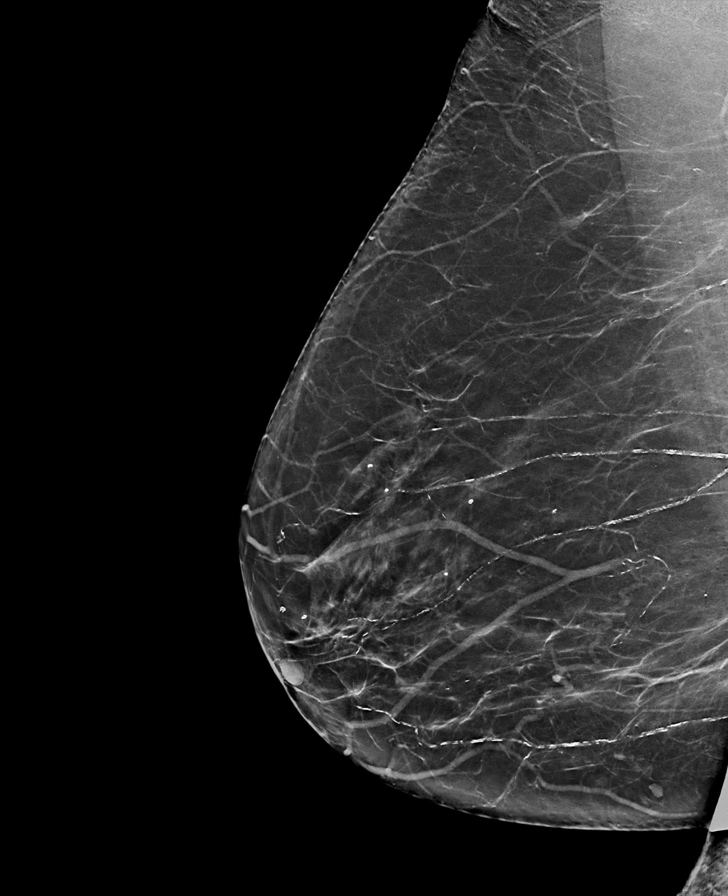

[L CC synth-2D (1 of 2)]
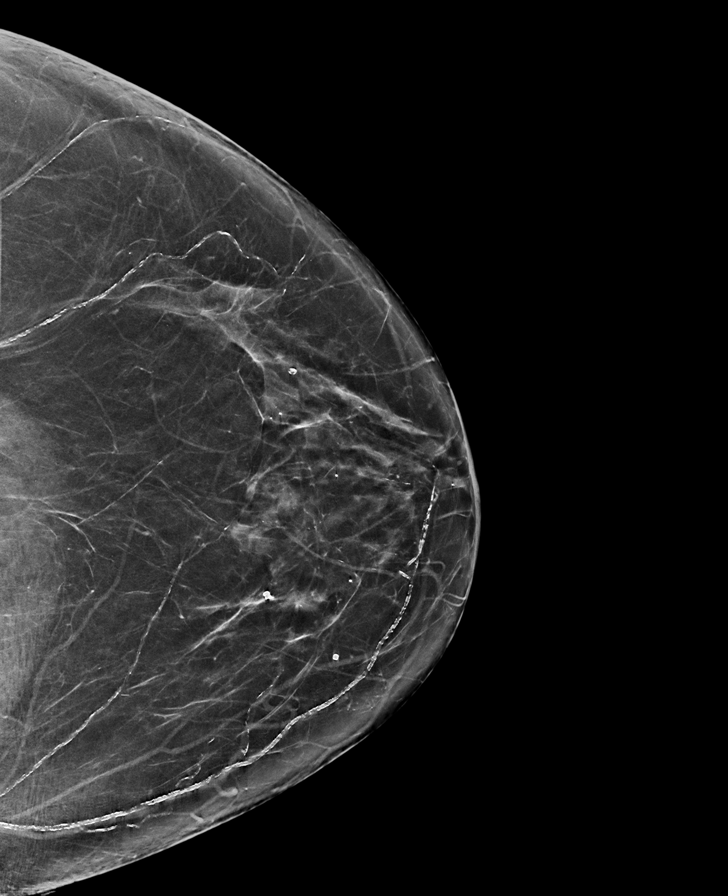

[L MLO synth-2D (1 of 2)]
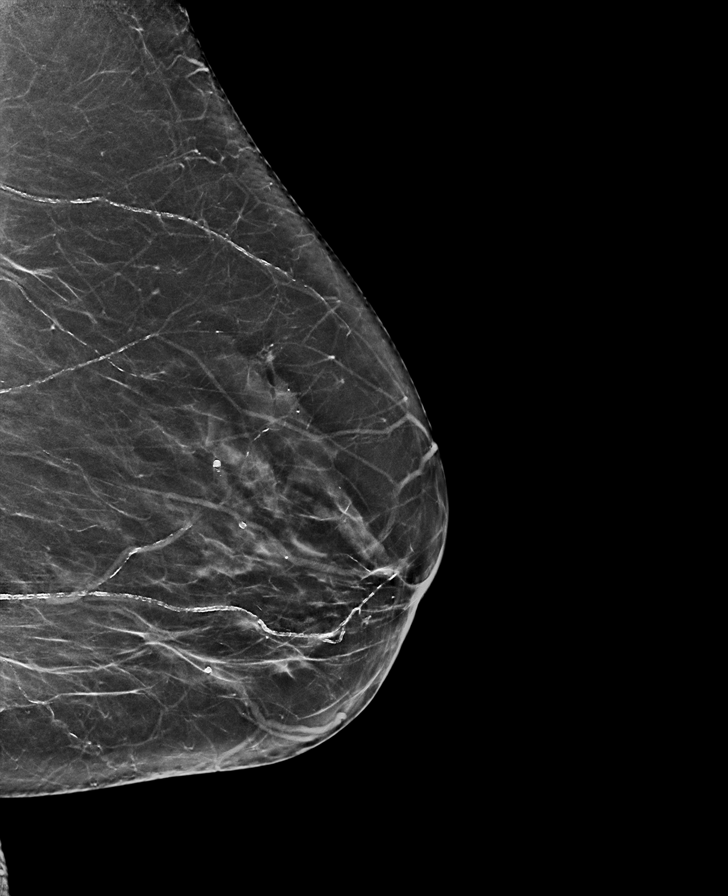

[L CC synth-2D (2 of 2)]
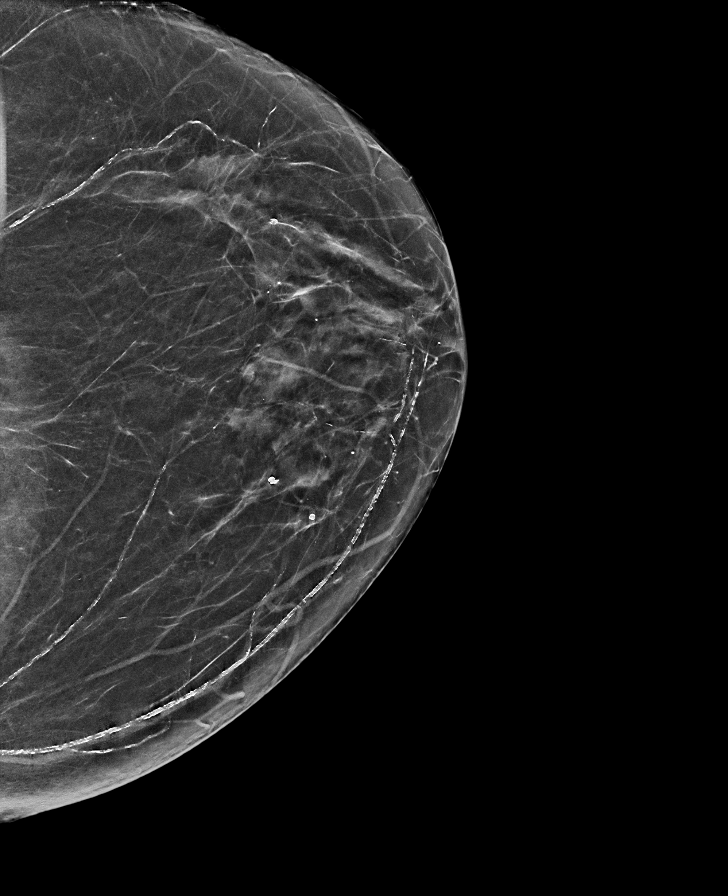

[R CC synth-2D (1 of 2)]
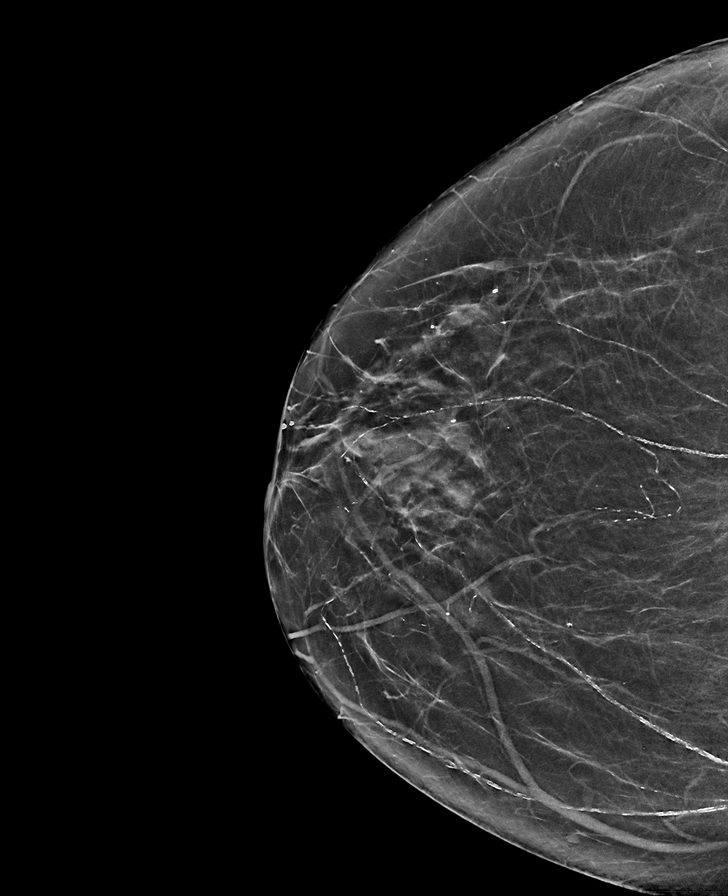

[L MLO synth-2D (2 of 2)]
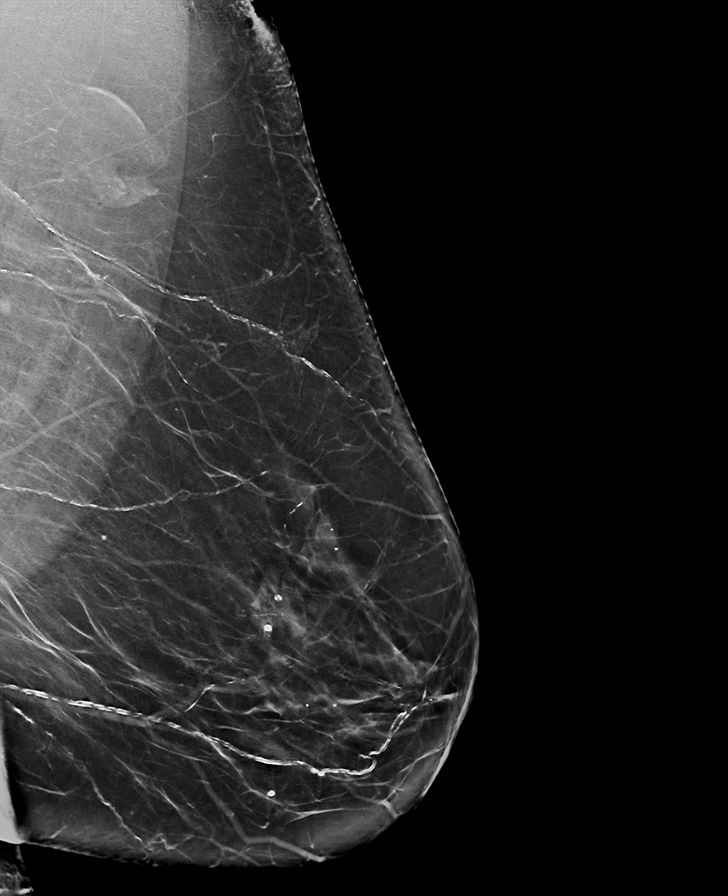

[R CC synth-2D (2 of 2)]
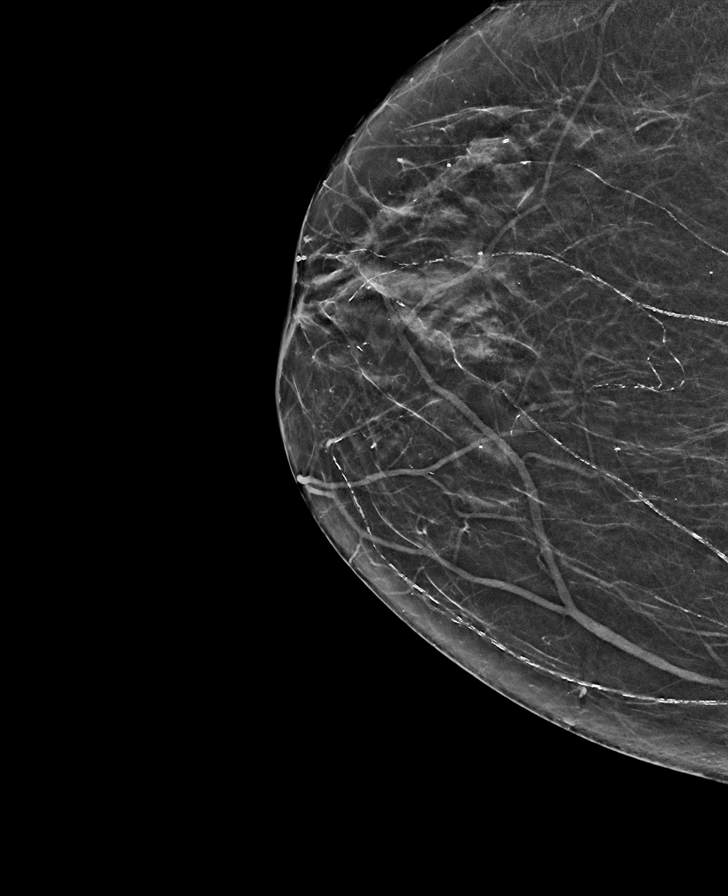

[R MLO tomo · tomo slice 37/72.0]
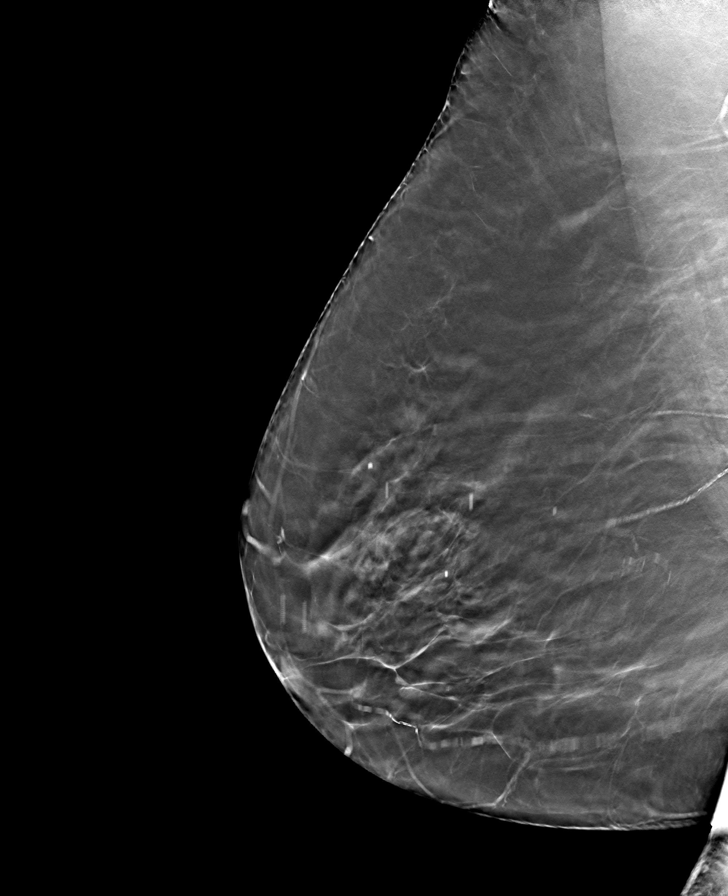

[8 of 40 positions shown; findings below may reference images not displayed]

ACR Breast Density Category b: There are scattered areas of
fibroglandular density.
FINDINGS: There are no findings suspicious for malignancy. Images were
processed with CAD.
IMPRESSION: No mammographic evidence of malignancy. A result letter of this
screening mammogram will be mailed directly to the patient.

RECOMMENDATION:
Screening mammogram in one year. (Code:[id number])

BI-RADS CATEGORY  1: Negative.
